# Patient Record
Sex: Male | Born: 1945 | Race: Black or African American | Hispanic: No | Marital: Married | Smoking: Former smoker
Health system: Southern US, Community
[De-identification: ages and names within clinical notes are randomized; demographics above are authoritative.]

## PROBLEM LIST (undated history)

## (undated) DIAGNOSIS — I10 Essential (primary) hypertension: Secondary | ICD-10-CM

## (undated) DIAGNOSIS — K219 Gastro-esophageal reflux disease without esophagitis: Secondary | ICD-10-CM

## (undated) DIAGNOSIS — E785 Hyperlipidemia, unspecified: Secondary | ICD-10-CM

## (undated) DIAGNOSIS — F4310 Post-traumatic stress disorder, unspecified: Secondary | ICD-10-CM

## (undated) DIAGNOSIS — I480 Paroxysmal atrial fibrillation: Secondary | ICD-10-CM

## (undated) DIAGNOSIS — M1990 Unspecified osteoarthritis, unspecified site: Secondary | ICD-10-CM

---

## 2018-10-04 ENCOUNTER — Other Ambulatory Visit: Payer: Self-pay

## 2018-10-04 ENCOUNTER — Emergency Department (HOSPITAL_COMMUNITY)
Admission: EM | Admit: 2018-10-04 | Discharge: 2018-10-04 | Disposition: A | Discharge status: Home / Self Care | Payer: PPO | Attending: Emergency Medicine | Admitting: Emergency Medicine

## 2018-10-04 ENCOUNTER — Encounter (HOSPITAL_COMMUNITY): Payer: Self-pay

## 2018-10-04 DIAGNOSIS — R319 Hematuria, unspecified: Secondary | ICD-10-CM | POA: Diagnosis not present

## 2018-10-04 HISTORY — DX: Gastro-esophageal reflux disease without esophagitis: K21.9

## 2018-10-04 HISTORY — DX: Unspecified osteoarthritis, unspecified site: M19.90

## 2018-10-04 HISTORY — DX: Post-traumatic stress disorder, unspecified: F43.10

## 2018-10-04 LAB — URINALYSIS, ROUTINE W REFLEX MICROSCOPIC
Bacteria, UA: NONE SEEN
Bilirubin Urine: NEGATIVE
Glucose, UA: NEGATIVE mg/dL
Ketones, ur: NEGATIVE mg/dL
Leukocytes, UA: NEGATIVE
Nitrite: NEGATIVE
Protein, ur: NEGATIVE mg/dL
Specific Gravity, Urine: 1.02 (ref 1.005–1.030)
pH: 6 (ref 5.0–8.0)

## 2018-10-04 NOTE — ED Triage Notes (Signed)
Pt reports bright red blood in his urine today. Denies pain or burning with urination . Denies abd pain.

## 2018-10-04 NOTE — ED Notes (Signed)
Called to confirm lab does not have a culture tube from patient.  Patient attempting to provide additional sample prior to d/c

## 2018-10-04 NOTE — ED Notes (Signed)
ED Provider at bedside. 

## 2018-10-04 NOTE — ED Provider Notes (Signed)
[facility] EMERGENCY DEPARTMENT Provider Note   CSN: [id number] Arrival date & time: 10/04/18  2027     History   Chief Complaint Chief Complaint  Patient presents with  . Hematuria    HPI Jerry Tran is a 72 y.o. male.  Patient presents to the emergency department with a chief complaint of hematuria.  He reports having had a couple of episodes of this today.  He has never had this before.  He denies any dysuria, penile discharge, flank pain, abdominal pain, or fever.  He denies any trauma.  Denies any back pain.  No treatments prior to arrival.  States that his urine has since cleared up some.  The history is provided by the patient. No language interpreter was used.    Past Medical History:  Diagnosis Date  . Arthritis   . GERD (gastroesophageal reflux disease)   . PTSD (post-traumatic stress disorder)     There are no active problems to display for this patient.   History reviewed. No pertinent surgical history.      Home Medications    Prior to Admission medications   Not on File    Family History No family history on file.  Social History Social History   Tobacco Use  . Smoking status: Never Smoker  Substance Use Topics  . Alcohol use: Never    Frequency: Never  . Drug use: Never     Allergies   Patient has no allergy information on record.   Review of Systems Review of Systems  All other systems reviewed and are negative.    Physical Exam Updated Vital Signs BP (!) 159/97 (BP Location: Right Arm)   Pulse 80   Temp 98.6 F (37 C) (Oral)   Resp 16   Ht 6\' 2"  (1.88 m)   Wt 110.2 kg   SpO2 100%   BMI 31.20 kg/m   Physical Exam  Constitutional: He is oriented to person, place, and time. He appears well-developed and well-nourished.  HENT:  Head: Normocephalic and atraumatic.  Eyes: Conjunctivae and EOM are normal.  Neck: Normal range of motion.  Cardiovascular: Normal rate.  Pulmonary/Chest: Effort normal.    Abdominal: He exhibits no distension.  No CVA tenderness, no abdominal tenderness  Genitourinary:  Genitourinary Comments: Uncircumcised male, no masses or lesions, no penile discharge, no testicular tenderness  Musculoskeletal: Normal range of motion.  Neurological: He is alert and oriented to person, place, and time.  Skin: Skin is dry.  Psychiatric: He has a normal mood and affect. His behavior is normal. Judgment and thought content normal.  Nursing note and vitals reviewed.    ED Treatments / Results  Labs (all labs ordered are listed, but only abnormal results are displayed) Labs Reviewed  URINALYSIS, ROUTINE W REFLEX MICROSCOPIC - Abnormal; Notable for the following components:      Result Value   Hgb urine dipstick MODERATE (*)    All other components within normal limits    EKG None  Radiology No results found.  Procedures Procedures (including critical care time)  Medications Ordered in ED Medications - No data to display   Initial Impression / Assessment and Plan / ED Course  I have reviewed the triage vital signs and the nursing notes.  Pertinent labs & imaging results that were available during my care of the patient were reviewed by me and considered in my medical decision making (see chart for details).     Patient with a couple of episodes  of painless hematuria today.  No evidence of kidney stone and history of physical exam, no evidence of UTI, only RBCs and hemoglobin on UA.  Will send for culture.  Discussed the importance of urology follow-up, as he will need to be evaluated for bladder cancer.  Patient will contact urology on Monday.  Return precautions given.  Final Clinical Impressions(s) / ED Diagnoses   Final diagnoses:  Hematuria, unspecified type    ED Discharge Orders    None       Montine Circle, PA-C 10/04/18 2233    Virgel Manifold, MD 10/05/18 1556

## 2018-10-06 LAB — URINE CULTURE

## 2021-03-30 DIAGNOSIS — C61 Malignant neoplasm of prostate: Secondary | ICD-10-CM | POA: Diagnosis present

## 2021-03-30 HISTORY — DX: Malignant neoplasm of prostate: C61

## 2021-09-11 ENCOUNTER — Emergency Department (HOSPITAL_COMMUNITY)
Admission: EM | Admit: 2021-09-11 | Discharge: 2021-09-11 | Disposition: A | Discharge status: Home / Self Care | Payer: No Typology Code available for payment source | Attending: Emergency Medicine | Admitting: Emergency Medicine

## 2021-09-11 ENCOUNTER — Encounter (HOSPITAL_COMMUNITY): Payer: Self-pay | Admitting: Emergency Medicine

## 2021-09-11 ENCOUNTER — Other Ambulatory Visit: Payer: Self-pay

## 2021-09-11 DIAGNOSIS — R42 Dizziness and giddiness: Secondary | ICD-10-CM | POA: Insufficient documentation

## 2021-09-11 LAB — CBC WITH DIFFERENTIAL/PLATELET
Abs Immature Granulocytes: 0.02 10*3/uL (ref 0.00–0.07)
Basophils Absolute: 0 10*3/uL (ref 0.0–0.1)
Basophils Relative: 1 %
Eosinophils Absolute: 0.1 10*3/uL (ref 0.0–0.5)
Eosinophils Relative: 1 %
HCT: 33.2 % — ABNORMAL LOW (ref 39.0–52.0)
Hemoglobin: 10.4 g/dL — ABNORMAL LOW (ref 13.0–17.0)
Immature Granulocytes: 0 %
Lymphocytes Relative: 37 %
Lymphs Abs: 1.9 10*3/uL (ref 0.7–4.0)
MCH: 25.9 pg — ABNORMAL LOW (ref 26.0–34.0)
MCHC: 31.3 g/dL (ref 30.0–36.0)
MCV: 82.6 fL (ref 80.0–100.0)
Monocytes Absolute: 0.4 10*3/uL (ref 0.1–1.0)
Monocytes Relative: 7 %
Neutro Abs: 2.6 10*3/uL (ref 1.7–7.7)
Neutrophils Relative %: 54 %
Platelets: 76 10*3/uL — ABNORMAL LOW (ref 150–400)
RBC: 4.02 MIL/uL — ABNORMAL LOW (ref 4.22–5.81)
RDW: 15 % (ref 11.5–15.5)
WBC: 5 10*3/uL (ref 4.0–10.5)
nRBC: 0 % (ref 0.0–0.2)

## 2021-09-11 LAB — COMPREHENSIVE METABOLIC PANEL
ALT: 18 U/L (ref 0–44)
AST: 29 U/L (ref 15–41)
Albumin: 3.4 g/dL — ABNORMAL LOW (ref 3.5–5.0)
Alkaline Phosphatase: 95 U/L (ref 38–126)
Anion gap: 8 (ref 5–15)
BUN: 37 mg/dL — ABNORMAL HIGH (ref 8–23)
CO2: 26 mmol/L (ref 22–32)
Calcium: 9.1 mg/dL (ref 8.9–10.3)
Chloride: 105 mmol/L (ref 98–111)
Creatinine, Ser: 1.85 mg/dL — ABNORMAL HIGH (ref 0.61–1.24)
GFR, Estimated: 38 mL/min — ABNORMAL LOW (ref >60)
Glucose, Bld: 114 mg/dL — ABNORMAL HIGH (ref 70–99)
Potassium: 3.7 mmol/L (ref 3.5–5.1)
Sodium: 139 mmol/L (ref 135–145)
Total Bilirubin: 0.7 mg/dL (ref 0.3–1.2)
Total Protein: 8.1 g/dL (ref 6.5–8.1)

## 2021-09-11 LAB — TROPONIN I (HIGH SENSITIVITY)
Troponin I (High Sensitivity): 12 ng/L (ref <18)
Troponin I (High Sensitivity): 13 ng/L (ref <18)

## 2021-09-11 LAB — CBG MONITORING, ED: Glucose-Capillary: 107 mg/dL — ABNORMAL HIGH (ref 70–99)

## 2021-09-11 MED ORDER — SODIUM CHLORIDE 0.9 % IV BOLUS
500.0000 mL | Freq: Once | INTRAVENOUS | Status: AC
  Administered 2021-09-11: 500 mL via INTRAVENOUS

## 2021-09-11 MED ORDER — SODIUM CHLORIDE 0.9 % IV BOLUS: 1000.0000 mL | Freq: Once | INTRAVENOUS | Status: DC

## 2021-09-11 NOTE — ED Triage Notes (Signed)
C/o dizziness since waking up at 8:30am this morning. BP at home 99/66.  Went to bed at 1am and felt fine.  No weakness/numbness.  States BP has been low since Wednesday and he had a syncopal episode on Wednesday and seen at [facility] in [place].  Denies SOB, pain, nausea, and vomiting.

## 2021-09-11 NOTE — Discharge Instructions (Addendum)
For now do not take your furosemide until further instructed by your primary care physician.  Call your primary doctor tomorrow morning to help arrange outpatient repeat lab work and follow-up for your medications.

## 2021-09-11 NOTE — ED Provider Notes (Signed)
[facility] EMERGENCY DEPARTMENT Provider Note   CSN: [id number] Arrival date & time: 09/11/21  1109     History Chief Complaint  Patient presents with   Dizziness   Low BP    Jerry Tran is a 75 y.o. male.  HPI 75 year old male presents with dizziness and low blood pressure.  At around 1 AM on 11/9 he got up to go get some water and while he was getting water he passed out.  He went to the [facility] emergency department and then he was released.  Family states they check some blood work and told him to restart his meds which she had recently stopped.  Since then he has been not feeling quite right and a little bit dizzy.  Today he was lightheaded again when he woke up and so his wife took his blood pressure and it was 99/60.  He has been taking his meds including metoprolol and Lasix.  He was recently diagnosed with congestive heart failure by report about a month or so ago.  However he has no chest pain, shortness of breath, leg swelling.  He denies headache, fever, vomiting, diarrhea, or any other acute symptoms.  Right now he is not feeling dizzy.  Past Medical History:  Diagnosis Date   Arthritis    GERD (gastroesophageal reflux disease)    PTSD (post-traumatic stress disorder)     There are no problems to display for this patient.   History reviewed. No pertinent surgical history.     No family history on file.  Social History   Tobacco Use   Smoking status: Never   Smokeless tobacco: Never  Substance Use Topics   Alcohol use: Never   Drug use: Never    Home Medications Prior to Admission medications   Medication Sig Start Date End Date Taking? Authorizing Provider  ammonium lactate (LAC-HYDRIN) 12 % lotion Apply 1 application topically 2 (two) times daily as needed for dry skin.   Yes [provider]  bisacodyl (DULCOLAX) 5 MG EC tablet Take 5 mg by mouth daily as needed for moderate constipation.   Yes [provider]   cholecalciferol (VITAMIN D3) 25 MCG (1000 UNIT) tablet Take 1,000 Units by mouth daily.   Yes [provider]  diclofenac Sodium (VOLTAREN) 1 % GEL Apply 2 g topically 4 (four) times daily as needed (pain).   Yes [provider]  empagliflozin (JARDIANCE) 25 MG TABS tablet Take 12.5 mg by mouth daily.   Yes [provider]  flecainide (TAMBOCOR) 100 MG tablet Take 100 mg by mouth every 12 (twelve) hours.   Yes [provider]  fluticasone (FLONASE) 50 MCG/ACT nasal spray Place 1 spray into both nostrils daily as needed for allergies or rhinitis.   Yes [provider]  furosemide (LASIX) 20 MG tablet Take 20 mg by mouth as directed. Take 1 tablet Daily & Take 1 tablet if weight gain>5 lbs   Yes [provider]  hydroxypropyl methylcellulose / hypromellose (ISOPTO TEARS / GONIOVISC) 2.5 % ophthalmic solution Place 2 drops into both eyes in the morning and at bedtime.   Yes [provider]  meloxicam (MOBIC) 15 MG tablet Take 15 mg by mouth daily.   Yes [provider]  methocarbamol (ROBAXIN) 500 MG tablet Take 500 mg by mouth every 8 (eight) hours as needed for muscle spasms.   Yes [provider]  metoprolol succinate (TOPROL-XL) 50 MG 24 hr tablet Take 25 mg by mouth  every evening. Take with or immediately following a meal.   Yes [provider]  omeprazole (PRILOSEC) 20 MG capsule Take 20 mg by mouth daily.   Yes [provider]  prazosin (MINIPRESS) 2 MG capsule Take 6 mg by mouth at bedtime.   Yes [provider]  terbinafine (LAMISIL) 1 % cream Apply 1 application topically 2 (two) times daily as needed (athlete's foot).   Yes [provider]  triamcinolone cream (KENALOG) 0.1 % Apply 1 application topically 2 (two) times daily as needed (rash).   Yes [provider]    Allergies    Patient has no known allergies.  Review of Systems   Review of Systems   Constitutional:  Negative for fever.  Respiratory:  Negative for cough and shortness of breath.   Cardiovascular:  Negative for chest pain.  Gastrointestinal:  Negative for abdominal pain, diarrhea and vomiting.  Genitourinary:  Negative for dysuria.  Neurological:  Positive for syncope and light-headedness. Negative for headaches.  All other systems reviewed and are negative.  Physical Exam Updated Vital Signs BP 136/81   Pulse 71   Temp 98.6 F (37 C) (Oral)   Resp 14   SpO2 (!) 86%   Physical Exam Vitals and nursing note reviewed.  Constitutional:      General: He is not in acute distress.    Appearance: He is well-developed. He is not ill-appearing or diaphoretic.  HENT:     Head: Normocephalic and atraumatic.     Right Ear: External ear normal.     Left Ear: External ear normal.     Nose: Nose normal.  Eyes:     General:        Right eye: No discharge.        Left eye: No discharge.     Extraocular Movements: Extraocular movements intact.     Pupils: Pupils are equal, round, and reactive to light.  Cardiovascular:     Rate and Rhythm: Normal rate and regular rhythm.     Heart sounds: Normal heart sounds.  Pulmonary:     Effort: Pulmonary effort is normal.     Breath sounds: Normal breath sounds.  Abdominal:     Palpations: Abdomen is soft.     Tenderness: There is no abdominal tenderness.  Musculoskeletal:     Cervical back: Neck supple.     Right lower leg: No edema.     Left lower leg: No edema.  Skin:    General: Skin is warm and dry.  Neurological:     Mental Status: He is alert.     Comments: CN 3-12 grossly intact. 5/5 strength in all 4 extremities. Grossly normal sensation. Normal finger to nose.   Psychiatric:        Mood and Affect: Mood is not anxious.    ED Results / Procedures / Treatments   Labs (all labs ordered are listed, but only abnormal results are displayed) Labs Reviewed  COMPREHENSIVE METABOLIC PANEL - Abnormal; Notable for the  following components:      Result Value   Glucose, Bld 114 (*)    BUN 37 (*)    Creatinine, Ser 1.85 (*)    Albumin 3.4 (*)    GFR, Estimated 38 (*)    All other components within normal limits  CBC WITH DIFFERENTIAL/PLATELET - Abnormal; Notable for the following components:   RBC 4.02 (*)    Hemoglobin 10.4 (*)    HCT 33.2 (*)    [facility]  25.9 (*)    Platelets 76 (*)    All other components within normal limits  CBG MONITORING, ED - Abnormal; Notable for the following components:   Glucose-Capillary 107 (*)    All other components within normal limits  TROPONIN I (HIGH SENSITIVITY)  TROPONIN I (HIGH SENSITIVITY)    EKG EKG Interpretation  Date/Time:  Sunday September 11 2021 11:55:03 EST Ventricular Rate:  67 PR Interval:  210 QRS Duration: 82 QT Interval:  372 QTC Calculation: 393 R Axis:   78 Text Interpretation: Sinus rhythm with sinus arrhythmia with 1st degree A-V block Possible Anterior infarct , age undetermined Abnormal ECG Confirmed by Kommor, Madison ([id number]) on 09/11/2021 2:22:58 PM  Radiology No results found.  Procedures Procedures   Medications Ordered in ED Medications  sodium chloride 0.9 % bolus 500 mL (500 mLs Intravenous New Bag/Given 09/11/21 1547)    ED Course  I have reviewed the triage vital signs and the nursing notes.  Pertinent labs & imaging results that were available during my care of the patient were reviewed by me and considered in my medical decision making (see chart for details).    MDM Rules/Calculators/A&P                           Patient has no hypotension currently.  He feels quite well.  He was given a small bolus of IV fluids as it is unclear based on lack of significant outside records what his EF is.  However he was able to stand up and while he transiently had a blood pressure of 99 he felt no symptoms and while standing it came back up.  I think it be best for him to hold his 20 mg Lasix for now and follow-up with his PCP.   We discussed that he has an abnormal creatinine today though is unclear what his baseline is.  We try to get some records but after an hour and a half, still have been unable to get these records from the [place].  Patient and family want to go home which I think is pretty reasonable as I doubt he is in acute renal failure.  However discussed need to follow-up closely with PCP and get labs rechecked.  No evidence of acute infection/sepsis.  No chest symptoms. Final Clinical Impression(s) / ED Diagnoses Final diagnoses:  Lightheadedness    Rx / DC Orders ED Discharge Orders     None        Sherwood Gambler, MD 09/11/21 1640

## 2021-09-11 NOTE — ED Provider Notes (Signed)
Emergency Medicine Provider Triage Evaluation Note  Jerry Tran , a 75 y.o. male  was evaluated in triage.  Pt complains of tension and dizziness.  He states that about 830 this morning he woke up feeling dizzy.  He states he took his blood pressure at home and it was 90s over 60s.  States that this has been an issue over the past week and a half.  He called primary care on Friday 09/02/21 for low blood pressure and dizziness.  They instructed him to stop a blood pressure medication which he is unsure which 1.  He states that on Wednesday he then had similar symptoms and low blood pressure and presented to the emergency department at the [facility].  He states that he then had an episode this morning that was the same endorses syncope on Wednesday.  Has not had syncope today.  He denies chest pain, palpitations, shortness of breath, nausea or vomiting, fevers, numbness or tingling to bilateral upper or lower extremities, slurred speech. Review of Systems  Positive: Dizziness, lightheadedness, syncope Negative: Chest pain, shortness of breath  Physical Exam  BP (!) 97/58   Pulse 67   Temp 98.6 F (37 C) (Oral)   Resp 18   SpO2 97%  Gen:   Awake, no distress   Resp:  Normal effort  MSK:   Moves extremities without difficulty  Other:  S1/S2 without murmurs.  Pulses 2+ bilaterally.  He is alert and oriented with no focal neurological deficits.  Medical Decision Making  Medically screening exam initiated at 12:06 PM.  Appropriate orders placed.  Kendel Bessey was informed that the remainder of the evaluation will be completed by another provider, this initial triage assessment does not replace that evaluation, and the importance of remaining in the ED until their evaluation is complete.     Mickie Hillier, PA-C 09/11/21 1209    Jeanell Sparrow, DO 09/11/21 1512

## 2021-12-08 ENCOUNTER — Encounter (HOSPITAL_COMMUNITY): Payer: Self-pay | Admitting: Emergency Medicine

## 2021-12-08 ENCOUNTER — Emergency Department (HOSPITAL_COMMUNITY): Payer: No Typology Code available for payment source

## 2021-12-08 ENCOUNTER — Inpatient Hospital Stay (HOSPITAL_COMMUNITY)
Admission: EM | Admit: 2021-12-08 | Discharge: 2021-12-11 | DRG: 840 | Disposition: A | Discharge status: Home / Self Care | Payer: No Typology Code available for payment source | Attending: Internal Medicine | Admitting: Internal Medicine

## 2021-12-08 ENCOUNTER — Other Ambulatory Visit: Payer: Self-pay

## 2021-12-08 DIAGNOSIS — Z7984 Long term (current) use of oral hypoglycemic drugs: Secondary | ICD-10-CM

## 2021-12-08 DIAGNOSIS — I480 Paroxysmal atrial fibrillation: Secondary | ICD-10-CM | POA: Diagnosis present

## 2021-12-08 DIAGNOSIS — I272 Pulmonary hypertension, unspecified: Secondary | ICD-10-CM | POA: Diagnosis present

## 2021-12-08 DIAGNOSIS — R591 Generalized enlarged lymph nodes: Secondary | ICD-10-CM

## 2021-12-08 DIAGNOSIS — I509 Heart failure, unspecified: Secondary | ICD-10-CM

## 2021-12-08 DIAGNOSIS — A048 Other specified bacterial intestinal infections: Secondary | ICD-10-CM | POA: Diagnosis present

## 2021-12-08 DIAGNOSIS — N1832 Chronic kidney disease, stage 3b: Secondary | ICD-10-CM | POA: Diagnosis present

## 2021-12-08 DIAGNOSIS — Z87891 Personal history of nicotine dependence: Secondary | ICD-10-CM

## 2021-12-08 DIAGNOSIS — D696 Thrombocytopenia, unspecified: Secondary | ICD-10-CM

## 2021-12-08 DIAGNOSIS — Z923 Personal history of irradiation: Secondary | ICD-10-CM

## 2021-12-08 DIAGNOSIS — Z79899 Other long term (current) drug therapy: Secondary | ICD-10-CM

## 2021-12-08 DIAGNOSIS — K219 Gastro-esophageal reflux disease without esophagitis: Secondary | ICD-10-CM | POA: Diagnosis present

## 2021-12-08 DIAGNOSIS — C8311 Mantle cell lymphoma, lymph nodes of head, face, and neck: Principal | ICD-10-CM | POA: Diagnosis present

## 2021-12-08 DIAGNOSIS — Z91120 Patient's intentional underdosing of medication regimen due to financial hardship: Secondary | ICD-10-CM

## 2021-12-08 DIAGNOSIS — D61818 Other pancytopenia: Secondary | ICD-10-CM | POA: Diagnosis present

## 2021-12-08 DIAGNOSIS — E785 Hyperlipidemia, unspecified: Secondary | ICD-10-CM | POA: Diagnosis present

## 2021-12-08 DIAGNOSIS — I130 Hypertensive heart and chronic kidney disease with heart failure and stage 1 through stage 4 chronic kidney disease, or unspecified chronic kidney disease: Secondary | ICD-10-CM | POA: Diagnosis present

## 2021-12-08 DIAGNOSIS — R778 Other specified abnormalities of plasma proteins: Secondary | ICD-10-CM

## 2021-12-08 DIAGNOSIS — Z8546 Personal history of malignant neoplasm of prostate: Secondary | ICD-10-CM

## 2021-12-08 DIAGNOSIS — C61 Malignant neoplasm of prostate: Secondary | ICD-10-CM | POA: Diagnosis present

## 2021-12-08 DIAGNOSIS — Z791 Long term (current) use of non-steroidal anti-inflammatories (NSAID): Secondary | ICD-10-CM

## 2021-12-08 DIAGNOSIS — Z888 Allergy status to other drugs, medicaments and biological substances status: Secondary | ICD-10-CM

## 2021-12-08 DIAGNOSIS — R319 Hematuria, unspecified: Secondary | ICD-10-CM | POA: Diagnosis present

## 2021-12-08 DIAGNOSIS — I5033 Acute on chronic diastolic (congestive) heart failure: Secondary | ICD-10-CM | POA: Diagnosis present

## 2021-12-08 DIAGNOSIS — F4310 Post-traumatic stress disorder, unspecified: Secondary | ICD-10-CM | POA: Diagnosis present

## 2021-12-08 DIAGNOSIS — I10 Essential (primary) hypertension: Secondary | ICD-10-CM | POA: Diagnosis present

## 2021-12-08 DIAGNOSIS — Z20822 Contact with and (suspected) exposure to covid-19: Secondary | ICD-10-CM | POA: Diagnosis present

## 2021-12-08 DIAGNOSIS — B9681 Helicobacter pylori [H. pylori] as the cause of diseases classified elsewhere: Secondary | ICD-10-CM | POA: Diagnosis present

## 2021-12-08 HISTORY — DX: Hyperlipidemia, unspecified: E78.5

## 2021-12-08 HISTORY — DX: Paroxysmal atrial fibrillation: I48.0

## 2021-12-08 HISTORY — DX: Essential (primary) hypertension: I10

## 2021-12-08 LAB — COMPREHENSIVE METABOLIC PANEL
ALT: 15 U/L (ref 0–44)
AST: 51 U/L — ABNORMAL HIGH (ref 15–41)
Albumin: 2.9 g/dL — ABNORMAL LOW (ref 3.5–5.0)
Alkaline Phosphatase: 91 U/L (ref 38–126)
Anion gap: 7 (ref 5–15)
BUN: 19 mg/dL (ref 8–23)
CO2: 21 mmol/L — ABNORMAL LOW (ref 22–32)
Calcium: 8.5 mg/dL — ABNORMAL LOW (ref 8.9–10.3)
Chloride: 105 mmol/L (ref 98–111)
Creatinine, Ser: 1.72 mg/dL — ABNORMAL HIGH (ref 0.61–1.24)
GFR, Estimated: 41 mL/min — ABNORMAL LOW (ref >60)
Glucose, Bld: 94 mg/dL (ref 70–99)
Potassium: 3.7 mmol/L (ref 3.5–5.1)
Sodium: 133 mmol/L — ABNORMAL LOW (ref 135–145)
Total Bilirubin: 1.1 mg/dL (ref 0.3–1.2)
Total Protein: 7.4 g/dL (ref 6.5–8.1)

## 2021-12-08 LAB — CBC WITH DIFFERENTIAL/PLATELET
Abs Immature Granulocytes: 0 10*3/uL (ref 0.00–0.07)
Basophils Absolute: 0 10*3/uL (ref 0.0–0.1)
Basophils Relative: 0 %
Eosinophils Absolute: 0 10*3/uL (ref 0.0–0.5)
Eosinophils Relative: 0 %
HCT: 28.5 % — ABNORMAL LOW (ref 39.0–52.0)
Hemoglobin: 9 g/dL — ABNORMAL LOW (ref 13.0–17.0)
Lymphocytes Relative: 32 %
Lymphs Abs: 1.4 10*3/uL (ref 0.7–4.0)
MCH: 29.6 pg (ref 26.0–34.0)
MCHC: 31.6 g/dL (ref 30.0–36.0)
MCV: 93.8 fL (ref 80.0–100.0)
Monocytes Absolute: 0.1 10*3/uL (ref 0.1–1.0)
Monocytes Relative: 2 %
Neutro Abs: 2.8 10*3/uL (ref 1.7–7.7)
Neutrophils Relative %: 66 %
Platelets: 36 10*3/uL — ABNORMAL LOW (ref 150–400)
RBC: 3.04 MIL/uL — ABNORMAL LOW (ref 4.22–5.81)
RDW: 19.2 % — ABNORMAL HIGH (ref 11.5–15.5)
Smear Review: DECREASED
WBC: 4.3 10*3/uL (ref 4.0–10.5)
nRBC: 1.4 % — ABNORMAL HIGH (ref 0.0–0.2)
nRBC: 2 /100 WBC — ABNORMAL HIGH

## 2021-12-08 LAB — URINALYSIS, ROUTINE W REFLEX MICROSCOPIC
Bacteria, UA: NONE SEEN
Bilirubin Urine: NEGATIVE
Glucose, UA: NEGATIVE mg/dL
Ketones, ur: NEGATIVE mg/dL
Nitrite: NEGATIVE
Protein, ur: 100 mg/dL — AB
Specific Gravity, Urine: 1.019 (ref 1.005–1.030)
pH: 5 (ref 5.0–8.0)

## 2021-12-08 NOTE — ED Provider Triage Note (Signed)
Emergency Medicine Provider Triage Evaluation Note  Maxim Bedel , a 76 y.o. male  was evaluated in triage.  Pt complains of hematuria.  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  Over the past few weeks he notes increasing shortness of breath.  Worse with exertion and improves with rest.  No chest pain or abdominal pain.  States that recently he has had GI symptoms as well and notes frequent diarrhea.  No hematochezia, nausea, or vomiting.  Lastly, patient noted intermittent hematuria in the past but that began today and he had a single episode of bright red blood in his urine.  Denies any recurrent hematuria today.  Physical Exam  There were no vitals taken for this visit. Gen:   Awake, no distress   Resp:  Normal effort  MSK:   Moves extremities without difficulty  Other:    Medical Decision Making  Medically screening exam initiated at 10:14 PM.  Appropriate orders placed.  Viraaj Vorndran was informed that the remainder of the evaluation will be completed by another provider, this initial triage assessment does not replace that evaluation, and the importance of remaining in the ED until their evaluation is complete.   Rayna Sexton, PA-C 12/08/21 2215

## 2021-12-08 NOTE — ED Triage Notes (Signed)
Pt reported to ED with c/o hematuria, reports having it  "a couple of times" but is unclear about when it has happened. States he had an episode tonight and wants futher evaluation. Also reports starting "medicine to help him pee", but does not know the name of medication. Pt denies any pain at this time, denies any difficulty with urine or starting stream.

## 2021-12-09 ENCOUNTER — Encounter (HOSPITAL_COMMUNITY)
Admission: EM | Disposition: A | Discharge status: Home / Self Care | Payer: Self-pay | Source: Home / Self Care | Attending: Internal Medicine

## 2021-12-09 ENCOUNTER — Emergency Department (HOSPITAL_COMMUNITY): Payer: No Typology Code available for payment source

## 2021-12-09 ENCOUNTER — Inpatient Hospital Stay (HOSPITAL_COMMUNITY): Payer: No Typology Code available for payment source | Admitting: Anesthesiology

## 2021-12-09 ENCOUNTER — Encounter (HOSPITAL_COMMUNITY): Payer: Self-pay | Admitting: Internal Medicine

## 2021-12-09 DIAGNOSIS — I10 Essential (primary) hypertension: Secondary | ICD-10-CM

## 2021-12-09 DIAGNOSIS — R591 Generalized enlarged lymph nodes: Secondary | ICD-10-CM | POA: Diagnosis present

## 2021-12-09 DIAGNOSIS — I5033 Acute on chronic diastolic (congestive) heart failure: Secondary | ICD-10-CM | POA: Diagnosis present

## 2021-12-09 DIAGNOSIS — Z79899 Other long term (current) drug therapy: Secondary | ICD-10-CM | POA: Diagnosis not present

## 2021-12-09 DIAGNOSIS — Z87891 Personal history of nicotine dependence: Secondary | ICD-10-CM | POA: Diagnosis not present

## 2021-12-09 DIAGNOSIS — F4310 Post-traumatic stress disorder, unspecified: Secondary | ICD-10-CM | POA: Diagnosis present

## 2021-12-09 DIAGNOSIS — E785 Hyperlipidemia, unspecified: Secondary | ICD-10-CM | POA: Diagnosis present

## 2021-12-09 DIAGNOSIS — K219 Gastro-esophageal reflux disease without esophagitis: Secondary | ICD-10-CM

## 2021-12-09 DIAGNOSIS — A048 Other specified bacterial intestinal infections: Secondary | ICD-10-CM | POA: Diagnosis present

## 2021-12-09 DIAGNOSIS — N1832 Chronic kidney disease, stage 3b: Secondary | ICD-10-CM

## 2021-12-09 DIAGNOSIS — Z923 Personal history of irradiation: Secondary | ICD-10-CM | POA: Diagnosis not present

## 2021-12-09 DIAGNOSIS — B9681 Helicobacter pylori [H. pylori] as the cause of diseases classified elsewhere: Secondary | ICD-10-CM | POA: Diagnosis present

## 2021-12-09 DIAGNOSIS — F419 Anxiety disorder, unspecified: Secondary | ICD-10-CM

## 2021-12-09 DIAGNOSIS — Z8546 Personal history of malignant neoplasm of prostate: Secondary | ICD-10-CM | POA: Diagnosis not present

## 2021-12-09 DIAGNOSIS — D61818 Other pancytopenia: Secondary | ICD-10-CM | POA: Diagnosis present

## 2021-12-09 DIAGNOSIS — Z7984 Long term (current) use of oral hypoglycemic drugs: Secondary | ICD-10-CM | POA: Diagnosis not present

## 2021-12-09 DIAGNOSIS — Z791 Long term (current) use of non-steroidal anti-inflammatories (NSAID): Secondary | ICD-10-CM | POA: Diagnosis not present

## 2021-12-09 DIAGNOSIS — Z20822 Contact with and (suspected) exposure to covid-19: Secondary | ICD-10-CM | POA: Diagnosis present

## 2021-12-09 DIAGNOSIS — I272 Pulmonary hypertension, unspecified: Secondary | ICD-10-CM | POA: Diagnosis present

## 2021-12-09 DIAGNOSIS — Z91120 Patient's intentional underdosing of medication regimen due to financial hardship: Secondary | ICD-10-CM | POA: Diagnosis not present

## 2021-12-09 DIAGNOSIS — C8311 Mantle cell lymphoma, lymph nodes of head, face, and neck: Secondary | ICD-10-CM | POA: Diagnosis present

## 2021-12-09 DIAGNOSIS — I480 Paroxysmal atrial fibrillation: Secondary | ICD-10-CM | POA: Diagnosis present

## 2021-12-09 DIAGNOSIS — I130 Hypertensive heart and chronic kidney disease with heart failure and stage 1 through stage 4 chronic kidney disease, or unspecified chronic kidney disease: Secondary | ICD-10-CM | POA: Diagnosis present

## 2021-12-09 DIAGNOSIS — C61 Malignant neoplasm of prostate: Secondary | ICD-10-CM | POA: Diagnosis present

## 2021-12-09 DIAGNOSIS — R319 Hematuria, unspecified: Secondary | ICD-10-CM | POA: Diagnosis present

## 2021-12-09 DIAGNOSIS — R0602 Shortness of breath: Secondary | ICD-10-CM | POA: Diagnosis not present

## 2021-12-09 DIAGNOSIS — Z888 Allergy status to other drugs, medicaments and biological substances status: Secondary | ICD-10-CM | POA: Diagnosis not present

## 2021-12-09 HISTORY — DX: Chronic kidney disease, stage 3b: N18.32

## 2021-12-09 HISTORY — PX: LIPOMA EXCISION: SHX5283

## 2021-12-09 LAB — BRAIN NATRIURETIC PEPTIDE: B Natriuretic Peptide: 703.5 pg/mL — ABNORMAL HIGH (ref 0.0–100.0)

## 2021-12-09 LAB — CBC WITH DIFFERENTIAL/PLATELET
Abs Immature Granulocytes: 0.06 10*3/uL (ref 0.00–0.07)
Basophils Absolute: 0 10*3/uL (ref 0.0–0.1)
Basophils Relative: 1 %
Eosinophils Absolute: 0 10*3/uL (ref 0.0–0.5)
Eosinophils Relative: 0 %
HCT: 28.9 % — ABNORMAL LOW (ref 39.0–52.0)
Hemoglobin: 8.8 g/dL — ABNORMAL LOW (ref 13.0–17.0)
Immature Granulocytes: 1 %
Lymphocytes Relative: 35 %
Lymphs Abs: 1.6 10*3/uL (ref 0.7–4.0)
MCH: 29.1 pg (ref 26.0–34.0)
MCHC: 30.4 g/dL (ref 30.0–36.0)
MCV: 95.7 fL (ref 80.0–100.0)
Monocytes Absolute: 0.3 10*3/uL (ref 0.1–1.0)
Monocytes Relative: 6 %
Neutro Abs: 2.7 10*3/uL (ref 1.7–7.7)
Neutrophils Relative %: 57 %
Platelets: 44 10*3/uL — ABNORMAL LOW (ref 150–400)
RBC: 3.02 MIL/uL — ABNORMAL LOW (ref 4.22–5.81)
RDW: 19.8 % — ABNORMAL HIGH (ref 11.5–15.5)
WBC: 4.6 10*3/uL (ref 4.0–10.5)
nRBC: 0.6 % — ABNORMAL HIGH (ref 0.0–0.2)

## 2021-12-09 LAB — RESP PANEL BY RT-PCR (FLU A&B, COVID) ARPGX2
Influenza A by PCR: NEGATIVE
Influenza B by PCR: NEGATIVE
SARS Coronavirus 2 by RT PCR: NEGATIVE

## 2021-12-09 LAB — TROPONIN I (HIGH SENSITIVITY): Troponin I (High Sensitivity): 32 ng/L — ABNORMAL HIGH (ref <18)

## 2021-12-09 SURGERY — EXCISION LIPOMA
Anesthesia: General | Laterality: Right

## 2021-12-09 MED ORDER — ONDANSETRON HCL 4 MG/2ML IJ SOLN: 4.0000 mg | Freq: Four times a day (QID) | INTRAMUSCULAR | Status: DC | PRN

## 2021-12-09 MED ORDER — ONDANSETRON HCL 4 MG PO TABS: 4.0000 mg | ORAL_TABLET | Freq: Four times a day (QID) | ORAL | Status: DC | PRN

## 2021-12-09 MED ORDER — LACTATED RINGERS IV SOLN: INTRAVENOUS | Status: DC | PRN

## 2021-12-09 MED ORDER — METOPROLOL SUCCINATE ER 25 MG PO TB24: 25.0000 mg | ORAL_TABLET | Freq: Every evening | ORAL | Status: DC

## 2021-12-09 MED ORDER — DEXAMETHASONE SODIUM PHOSPHATE 10 MG/ML IJ SOLN
INTRAMUSCULAR | Status: DC | PRN
  Administered 2021-12-09: 10 mg via INTRAVENOUS

## 2021-12-09 MED ORDER — PHENYLEPHRINE 40 MCG/ML (10ML) SYRINGE FOR IV PUSH (FOR BLOOD PRESSURE SUPPORT)
PREFILLED_SYRINGE | INTRAVENOUS | Status: DC | PRN
  Administered 2021-12-09: 80 ug via INTRAVENOUS
  Administered 2021-12-09: 120 ug via INTRAVENOUS

## 2021-12-09 MED ORDER — EPHEDRINE SULFATE-NACL 50-0.9 MG/10ML-% IV SOSY
PREFILLED_SYRINGE | INTRAVENOUS | Status: DC | PRN
  Administered 2021-12-09 (×3): 5 mg via INTRAVENOUS
  Administered 2021-12-09: 10 mg via INTRAVENOUS

## 2021-12-09 MED ORDER — BUPIVACAINE-EPINEPHRINE 0.5% -1:200000 IJ SOLN
INTRAMUSCULAR | Status: AC
  Filled 2021-12-09: qty 1

## 2021-12-09 MED ORDER — CEFAZOLIN SODIUM-DEXTROSE 2-4 GM/100ML-% IV SOLN: 2.0000 g | Freq: Three times a day (TID) | INTRAVENOUS | Status: DC

## 2021-12-09 MED ORDER — PHENYLEPHRINE HCL-NACL 20-0.9 MG/250ML-% IV SOLN
INTRAVENOUS | Status: DC | PRN
Start: 2021-12-09 — End: 2021-12-09
  Administered 2021-12-09: 30 ug/min via INTRAVENOUS

## 2021-12-09 MED ORDER — LIDOCAINE HCL 1 % IJ SOLN
INTRAMUSCULAR | Status: AC
  Filled 2021-12-09: qty 20

## 2021-12-09 MED ORDER — LIDOCAINE 2% (20 MG/ML) 5 ML SYRINGE
INTRAMUSCULAR | Status: DC | PRN
  Administered 2021-12-09: 100 mg via INTRAVENOUS

## 2021-12-09 MED ORDER — PROMETHAZINE HCL 25 MG/ML IJ SOLN: 6.2500 mg | INTRAMUSCULAR | Status: DC | PRN

## 2021-12-09 MED ORDER — POLYETHYLENE GLYCOL 3350 17 G PO PACK: 17.0000 g | PACK | Freq: Every day | ORAL | Status: DC | PRN

## 2021-12-09 MED ORDER — PROPOFOL 10 MG/ML IV BOLUS
INTRAVENOUS | Status: DC | PRN
  Administered 2021-12-09: 20 mg via INTRAVENOUS
  Administered 2021-12-09: 150 mg via INTRAVENOUS

## 2021-12-09 MED ORDER — 0.9 % SODIUM CHLORIDE (POUR BTL) OPTIME
TOPICAL | Status: DC | PRN
  Administered 2021-12-09: 1000 mL

## 2021-12-09 MED ORDER — OXYCODONE HCL 5 MG PO TABS: 5.0000 mg | ORAL_TABLET | ORAL | Status: DC | PRN

## 2021-12-09 MED ORDER — ONDANSETRON HCL 4 MG/2ML IJ SOLN
INTRAMUSCULAR | Status: AC
  Filled 2021-12-09: qty 2

## 2021-12-09 MED ORDER — ENSURE ENLIVE PO LIQD
237.0000 mL | Freq: Two times a day (BID) | ORAL | Status: DC
  Administered 2021-12-10 (×2): 237 mL via ORAL

## 2021-12-09 MED ORDER — CHLORHEXIDINE GLUCONATE 0.12 % MT SOLN
OROMUCOSAL | Status: AC
  Administered 2021-12-09: 15 mL
  Filled 2021-12-09: qty 15

## 2021-12-09 MED ORDER — PRAZOSIN HCL 1 MG PO CAPS
6.0000 mg | ORAL_CAPSULE | Freq: Every day | ORAL | Status: DC
  Administered 2021-12-09 – 2021-12-10 (×2): 6 mg via ORAL
  Filled 2021-12-09 (×3): qty 6
  Filled 2021-12-09: qty 3

## 2021-12-09 MED ORDER — DEXAMETHASONE SODIUM PHOSPHATE 10 MG/ML IJ SOLN
INTRAMUSCULAR | Status: AC
  Filled 2021-12-09: qty 1

## 2021-12-09 MED ORDER — BISACODYL 5 MG PO TBEC: 5.0000 mg | DELAYED_RELEASE_TABLET | Freq: Every day | ORAL | Status: DC | PRN

## 2021-12-09 MED ORDER — FENTANYL CITRATE (PF) 250 MCG/5ML IJ SOLN
INTRAMUSCULAR | Status: DC | PRN
Start: 2021-12-09 — End: 2021-12-09
  Administered 2021-12-09 (×3): 25 ug via INTRAVENOUS
  Administered 2021-12-09: 50 ug via INTRAVENOUS
  Administered 2021-12-09 (×2): 25 ug via INTRAVENOUS

## 2021-12-09 MED ORDER — HYDRALAZINE HCL 20 MG/ML IJ SOLN: 5.0000 mg | INTRAMUSCULAR | Status: DC | PRN

## 2021-12-09 MED ORDER — FENTANYL CITRATE (PF) 250 MCG/5ML IJ SOLN
INTRAMUSCULAR | Status: AC
  Filled 2021-12-09: qty 5

## 2021-12-09 MED ORDER — IOHEXOL 350 MG/ML SOLN
75.0000 mL | Freq: Once | INTRAVENOUS | Status: AC | PRN
  Administered 2021-12-09: 75 mL via INTRAVENOUS

## 2021-12-09 MED ORDER — ACETAMINOPHEN 650 MG RE SUPP: 650.0000 mg | Freq: Four times a day (QID) | RECTAL | Status: DC | PRN

## 2021-12-09 MED ORDER — ACETAMINOPHEN 325 MG PO TABS: 650.0000 mg | ORAL_TABLET | Freq: Four times a day (QID) | ORAL | Status: DC | PRN

## 2021-12-09 MED ORDER — FENTANYL CITRATE (PF) 100 MCG/2ML IJ SOLN: 25.0000 ug | INTRAMUSCULAR | Status: DC | PRN

## 2021-12-09 MED ORDER — ATORVASTATIN CALCIUM 10 MG PO TABS
10.0000 mg | ORAL_TABLET | Freq: Every day | ORAL | Status: DC
  Administered 2021-12-09 – 2021-12-10 (×2): 10 mg via ORAL
  Filled 2021-12-09 (×2): qty 1

## 2021-12-09 MED ORDER — CEFAZOLIN SODIUM-DEXTROSE 2-4 GM/100ML-% IV SOLN
2.0000 g | INTRAVENOUS | Status: AC
Start: 2021-12-09 — End: 2021-12-09
  Administered 2021-12-09: 2 g via INTRAVENOUS
  Filled 2021-12-09: qty 100

## 2021-12-09 MED ORDER — DOCUSATE SODIUM 100 MG PO CAPS
100.0000 mg | ORAL_CAPSULE | Freq: Two times a day (BID) | ORAL | Status: DC
  Administered 2021-12-09 – 2021-12-11 (×3): 100 mg via ORAL
  Filled 2021-12-09 (×4): qty 1

## 2021-12-09 MED ORDER — FUROSEMIDE 10 MG/ML IJ SOLN
80.0000 mg | Freq: Once | INTRAMUSCULAR | Status: AC
  Administered 2021-12-09: 80 mg via INTRAVENOUS
  Filled 2021-12-09: qty 8

## 2021-12-09 MED ORDER — MORPHINE SULFATE (PF) 2 MG/ML IV SOLN: 2.0000 mg | INTRAVENOUS | Status: DC | PRN

## 2021-12-09 MED ORDER — ONDANSETRON HCL 4 MG/2ML IJ SOLN
INTRAMUSCULAR | Status: DC | PRN
Start: 2021-12-09 — End: 2021-12-09
  Administered 2021-12-09: 4 mg via INTRAVENOUS

## 2021-12-09 MED ORDER — AMISULPRIDE (ANTIEMETIC) 5 MG/2ML IV SOLN: 10.0000 mg | Freq: Once | INTRAVENOUS | Status: DC | PRN

## 2021-12-09 MED ORDER — PANTOPRAZOLE SODIUM 40 MG PO TBEC
40.0000 mg | DELAYED_RELEASE_TABLET | Freq: Every day | ORAL | Status: DC
  Administered 2021-12-09 – 2021-12-11 (×3): 40 mg via ORAL
  Filled 2021-12-09 (×3): qty 1

## 2021-12-09 MED ORDER — FLECAINIDE ACETATE 100 MG PO TABS
100.0000 mg | ORAL_TABLET | Freq: Two times a day (BID) | ORAL | Status: DC
  Administered 2021-12-09 – 2021-12-11 (×5): 100 mg via ORAL
  Filled 2021-12-09 (×7): qty 1

## 2021-12-09 MED ORDER — METOPROLOL TARTRATE 25 MG PO TABS
12.5000 mg | ORAL_TABLET | Freq: Two times a day (BID) | ORAL | Status: DC
  Administered 2021-12-09 – 2021-12-11 (×5): 12.5 mg via ORAL
  Filled 2021-12-09 (×5): qty 1

## 2021-12-09 SURGICAL SUPPLY — 37 items
ADH SKN CLS APL DERMABOND .7 (GAUZE/BANDAGES/DRESSINGS) ×1
BAG COUNTER SPONGE SURGICOUNT (BAG) ×2 IMPLANT
BAG SPNG CNTER NS LX DISP (BAG) ×1
CANISTER SUCT 3000ML PPV (MISCELLANEOUS) IMPLANT
COVER SURGICAL LIGHT HANDLE (MISCELLANEOUS) ×2 IMPLANT
DECANTER SPIKE VIAL GLASS SM (MISCELLANEOUS) IMPLANT
DERMABOND ADVANCED (GAUZE/BANDAGES/DRESSINGS) ×1
DERMABOND ADVANCED .7 DNX12 (GAUZE/BANDAGES/DRESSINGS) ×1 IMPLANT
DRAPE LAPAROTOMY 100X72 PEDS (DRAPES) ×2 IMPLANT
ELECT REM PT RETURN 9FT ADLT (ELECTROSURGICAL) ×2
ELECTRODE REM PT RTRN 9FT ADLT (ELECTROSURGICAL) ×1 IMPLANT
GLOVE SURG ENC MOIS LTX SZ7.5 (GLOVE) ×2 IMPLANT
GLOVE SURG UNDER LTX SZ8 (GLOVE) ×2 IMPLANT
GOWN STRL REUS W/ TWL LRG LVL3 (GOWN DISPOSABLE) ×1 IMPLANT
GOWN STRL REUS W/ TWL XL LVL3 (GOWN DISPOSABLE) ×1 IMPLANT
GOWN STRL REUS W/TWL LRG LVL3 (GOWN DISPOSABLE) ×2
GOWN STRL REUS W/TWL XL LVL3 (GOWN DISPOSABLE) ×2
HEMOSTAT ARISTA ABSORB 3G PWDR (HEMOSTASIS) ×1 IMPLANT
KIT BASIN OR (CUSTOM PROCEDURE TRAY) ×2 IMPLANT
KIT TURNOVER KIT B (KITS) ×2 IMPLANT
NDL HYPO 25GX1X1/2 BEV (NEEDLE) ×1 IMPLANT
NEEDLE HYPO 25GX1X1/2 BEV (NEEDLE) ×2 IMPLANT
NS IRRIG 1000ML POUR BTL (IV SOLUTION) ×2 IMPLANT
PACK GENERAL/GYN (CUSTOM PROCEDURE TRAY) ×2 IMPLANT
PAD ARMBOARD 7.5X6 YLW CONV (MISCELLANEOUS) ×2 IMPLANT
PENCIL SMOKE EVACUATOR (MISCELLANEOUS) ×2 IMPLANT
SHEARS HARMONIC 9CM CVD (BLADE) ×1 IMPLANT
SPECIMEN JAR MEDIUM (MISCELLANEOUS) ×2 IMPLANT
SUCTION FRAZIER TIP 8 FR DISP (SUCTIONS) ×2
SUCTION TUBE FRAZIER 8FR DISP (SUCTIONS) IMPLANT
SUT MNCRL AB 4-0 PS2 18 (SUTURE) ×2 IMPLANT
SUT SILK 2 0 SH CR/8 (SUTURE) ×1 IMPLANT
SUT VIC AB 3-0 SH 27 (SUTURE) ×2
SUT VIC AB 3-0 SH 27XBRD (SUTURE) ×1 IMPLANT
SYR CONTROL 10ML LL (SYRINGE) ×2 IMPLANT
TOWEL GREEN STERILE (TOWEL DISPOSABLE) ×2 IMPLANT
TOWEL GREEN STERILE FF (TOWEL DISPOSABLE) ×1 IMPLANT

## 2021-12-09 NOTE — ED Provider Notes (Signed)
[facility] EMERGENCY DEPARTMENT Provider Note   CSN: [id number] Arrival date & time: 12/08/21  2007     History  Chief Complaint  Patient presents with   Hematuria    Fischer Halley is a 76 y.o. male.  76 year old male who gets most of his care at the [place] the presents emerged from today for hematuria.  Patient also states her last few weeks has had progressively worsening weakness, decreased appetite, weight loss and generally not feeling well.  Patient states he is scheduled to get a lymph node biopsy in a week but the hematuria worried his wife along the shortness of breath so brought her here for further evaluation.  He has left greater than right lower extremity swelling which is new in the last day but lower extremity swelling is not new for him is on torsemide at home.  Still makes urine but not as much as he should.  Patient denies any chest pain or new back pain.   Hematuria      Home Medications Prior to Admission medications   Medication Sig Start Date End Date Taking? Authorizing Provider  ammonium lactate (LAC-HYDRIN) 12 % lotion Apply 1 application topically 2 (two) times daily as needed for dry skin.    [provider]  bisacodyl (DULCOLAX) 5 MG EC tablet Take 5 mg by mouth daily as needed for moderate constipation.    [provider]  cholecalciferol (VITAMIN D3) 25 MCG (1000 UNIT) tablet Take 1,000 Units by mouth daily.    [provider]  diclofenac Sodium (VOLTAREN) 1 % GEL Apply 2 g topically 4 (four) times daily as needed (pain).    [provider]  empagliflozin (JARDIANCE) 25 MG TABS tablet Take 12.5 mg by mouth daily.    [provider]  flecainide (TAMBOCOR) 100 MG tablet Take 100 mg by mouth every 12 (twelve) hours.    [provider]  fluticasone (FLONASE) 50 MCG/ACT nasal spray Place 1 spray into both nostrils daily as needed for allergies or rhinitis.    [provider]   furosemide (LASIX) 20 MG tablet Take 20 mg by mouth as directed. Take 1 tablet Daily & Take 1 tablet if weight gain>5 lbs    [provider]  hydroxypropyl methylcellulose / hypromellose (ISOPTO TEARS / GONIOVISC) 2.5 % ophthalmic solution Place 2 drops into both eyes in the morning and at bedtime.    [provider]  meloxicam (MOBIC) 15 MG tablet Take 15 mg by mouth daily.    [provider]  methocarbamol (ROBAXIN) 500 MG tablet Take 500 mg by mouth every 8 (eight) hours as needed for muscle spasms.    [provider]  metoprolol succinate (TOPROL-XL) 50 MG 24 hr tablet Take 25 mg by mouth every evening. Take with or immediately following a meal.    [provider]  omeprazole (PRILOSEC) 20 MG capsule Take 20 mg by mouth daily.    [provider]  prazosin (MINIPRESS) 2 MG capsule Take 6 mg by mouth at bedtime.    [provider]  terbinafine (LAMISIL) 1 % cream Apply 1 application topically 2 (two) times daily as needed (athlete's foot).    [provider]  triamcinolone cream (KENALOG) 0.1 % Apply 1 application topically 2 (two) times daily as needed (rash).    [provider]      Allergies    Patient has no known allergies.    Review of Systems   Review of  Systems  Genitourinary:  Positive for hematuria.   Physical Exam Updated Vital Signs BP (!) 155/102    Pulse 95    Temp 98.6 F (37 C) (Oral)    Resp 16    SpO2 98%  Physical Exam Vitals and nursing note reviewed.  Constitutional:      Appearance: He is well-developed.  HENT:     Head: Normocephalic and atraumatic.     Nose: Nose normal. No congestion or rhinorrhea.     Mouth/Throat:     Mouth: Mucous membranes are moist.     Pharynx: Oropharynx is clear.  Eyes:     Pupils: Pupils are equal, round, and reactive to light.  Cardiovascular:     Rate and Rhythm: Normal rate.  Pulmonary:     Effort: Pulmonary effort is normal. No respiratory  distress.  Abdominal:     General: Abdomen is flat. There is no distension.  Musculoskeletal:        General: No swelling or tenderness. Normal range of motion.     Cervical back: Normal range of motion.  Skin:    General: Skin is warm and dry.     Coloration: Skin is not jaundiced or pale.  Neurological:     General: No focal deficit present.     Mental Status: He is alert.    ED Results / Procedures / Treatments   Labs (all labs ordered are listed, but only abnormal results are displayed) Labs Reviewed  COMPREHENSIVE METABOLIC PANEL - Abnormal; Notable for the following components:      Result Value   Sodium 133 (*)    CO2 21 (*)    Creatinine, Ser 1.72 (*)    Calcium 8.5 (*)    Albumin 2.9 (*)    AST 51 (*)    GFR, Estimated 41 (*)    All other components within normal limits  CBC WITH DIFFERENTIAL/PLATELET - Abnormal; Notable for the following components:   RBC 3.04 (*)    Hemoglobin 9.0 (*)    HCT 28.5 (*)    RDW 19.2 (*)    Platelets 36 (*)    nRBC 1.4 (*)    nRBC 2 (*)    All other components within normal limits  URINALYSIS, ROUTINE W REFLEX MICROSCOPIC - Abnormal; Notable for the following components:   Color, Urine AMBER (*)    APPearance CLOUDY (*)    Hgb urine dipstick MODERATE (*)    Protein, ur 100 (*)    Leukocytes,Ua TRACE (*)    All other components within normal limits  BRAIN NATRIURETIC PEPTIDE - Abnormal; Notable for the following components:   B Natriuretic Peptide 703.5 (*)    All other components within normal limits  TROPONIN I (HIGH SENSITIVITY) - Abnormal; Notable for the following components:   Troponin I (High Sensitivity) 32 (*)    All other components within normal limits    EKG None  Radiology DG Chest 2 View  Result Date: 12/08/2021 CLINICAL DATA:  Shortness of breath EXAM: CHEST - 2 VIEW COMPARISON:  None. FINDINGS: Borderline heart size with normal pulmonary vascularity. Linear opacities in the mid and lower lungs likely  representing fibrosis or linear atelectasis. No focal consolidation. No pleural effusions. No pneumothorax. Mediastinal contours appear intact. Degenerative changes in the spine. IMPRESSION: Linear atelectasis or fibrosis in the mid and lower lungs. Borderline heart size. No evidence of active pulmonary disease. Electronically Signed   By: Lucienne Capers M.D.   On: 12/08/2021 22:58  CT Angio Chest PE W and/or Wo Contrast  Result Date: 12/09/2021 CLINICAL DATA:  High probability for pulmonary embolism EXAM: CT ANGIOGRAPHY CHEST WITH CONTRAST TECHNIQUE: Multidetector CT imaging of the chest was performed using the standard protocol during bolus administration of intravenous contrast. Multiplanar CT image reconstructions and MIPs were obtained to evaluate the vascular anatomy. RADIATION DOSE REDUCTION: This exam was performed according to the departmental dose-optimization program which includes automated exposure control, adjustment of the mA and/or kV according to patient size and/or use of iterative reconstruction technique. CONTRAST:  104mL OMNIPAQUE IOHEXOL 350 MG/ML SOLN COMPARISON:  None. FINDINGS: Cardiovascular: Suboptimal but satisfactory opacification of the pulmonary arteries to the segmental level when accounting for intermittent motion and streak artifact. No evidence of pulmonary embolism. Enlarged heart size. No pericardial effusion. Mediastinum/Nodes: Bilateral axillary lymphadenopathy with homogeneous nodal density. Nodes measure up to 2.2 cm bilaterally. There is also intrathoracic adenopathy scattered throughout the mediastinum and affecting bilateral hila. Left anterior mediastinal or IMA chain node measures up to 2.2 cm long axis. Partial coverage of submental adenopathy. Lungs/Pleura: Patchy opacity in the dependent lungs likely reflecting atelectasis. No focal pneumonia or evidence of edema. No effusion or air leak. There is right lower lobe paramediastinal bandlike opacity which is likely  scarring and sided by vertebral osteophytes Upper Abdomen: Partial coverage of the spleen shows marked enlargement, measuring at least 18 x 11 cm. Musculoskeletal: Thoracic spondylosis and degeneration which is generalized. No acute or destructive process. Review of the MIP images confirms the above findings. IMPRESSION: 1. Axillary and mediastinal adenopathy with marked enlargement of the partially covered spleen, lymphoma is the leading concern. 2. Negative for pulmonary embolism. Electronically Signed   By: Jorje Guild M.D.   On: 12/09/2021 05:33   CT Renal Stone Study  Result Date: 12/08/2021 CLINICAL DATA:  Hematuria EXAM: CT ABDOMEN AND PELVIS WITHOUT CONTRAST TECHNIQUE: Multidetector CT imaging of the abdomen and pelvis was performed following the standard protocol without IV contrast. RADIATION DOSE REDUCTION: This exam was performed according to the departmental dose-optimization program which includes automated exposure control, adjustment of the mA and/or kV according to patient size and/or use of iterative reconstruction technique. COMPARISON:  None. FINDINGS: Lower chest: Trace left pleural effusion. Hypodense blood pool relative to myocardium, suggesting anemia. Hepatobiliary: Unenhanced liver is unremarkable. Gallbladder is unremarkable. No intrahepatic or extrahepatic ductal dilatation. Pancreas: Within normal limits. Spleen: Enlarged, measuring 22 in maximal craniocaudal dimension. Adrenals/Urinary Tract: Adrenal glands are within normal limits. Kidneys are within normal limits. No renal calculi or hydronephrosis. Thick-walled bladder, although underdistended. Stomach/Bowel: Stomach is notable for mild concentric wall thickening of the gastric antrum (series 3/image 32), although poorly evaluated on CT. No evidence of bowel obstruction. Normal appendix (series 3/image 56). Scattered colonic diverticulosis, without evidence of diverticulitis. Vascular/Lymphatic: No evidence of abdominal aortic  aneurysm. Abdominopelvic lymphadenopathy, poorly evaluated on unenhanced CT, including: --2.8 cm short axis node in the porta hepatis (series 3/image 30) --2.8 cm short axis right para-aortic node (series 3/image 46) --3.2 cm short axis left external iliac node (series 3/image 63) --2.8 cm short axis right external iliac node (series 3/image 72) --2.0 cm short axis left inguinal node (series 3/image 88) --1.8 cm short axis right inguinal node (series 3/image 93) Reproductive: Prostate is notable for fiducial markers. Associated presacral soft tissue thickening likely reflects radiation changes. Other: Small volume pelvic ascites. Musculoskeletal: Degenerative changes of the visualized thoracolumbar spine. No focal osseous lesions. IMPRESSION: Abdominopelvic lymphadenopathy with splenomegaly, suggesting lymphoma. Fiducial markers along  the prostate, suggesting radiation treatment for prostate cancer. Technically speaking, at least some of the lymphadenopathy could also be related to this diagnosis, although this would not account for splenomegaly. Thick-walled bladder, possibly reflecting radiation changes. Trace left pleural effusion. Electronically Signed   By: Julian Hy M.D.   On: 12/08/2021 23:58    Procedures Procedures    Medications Ordered in ED Medications  furosemide (LASIX) injection 80 mg (80 mg Intravenous Given 12/09/21 0421)  iohexol (OMNIPAQUE) 350 MG/ML injection 75 mL (75 mLs Intravenous Contrast Given 12/09/21 0525)    ED Course/ Medical Decision Making/ A&P                           Medical Decision Making Amount and/or Complexity of Data Reviewed Labs: ordered. Radiology: ordered.  Risk Prescription drug management. Decision regarding hospitalization.   76 year old male has worsening lymphadenopathy on CT scans and enlarged spleen concerning for lymphoma.  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  I think a lot of her shortness of breath is from CHF and he is diuresing pretty well from that standpoint however after speaking with Dr. Lindi Adie with oncology he wants the patient to be admitted to get a lymph node biopsies and other work-up and possibly his acute treatment for lymphoma if this happens to be aggressive he states that wait in a week for biopsy is not appropriate.  Consult placed for same   Final Clinical Impression(s) / ED Diagnoses Final diagnoses:  Thrombocytopenia ([place])  Lymphadenopathy    Rx / DC Orders ED Discharge Orders     None         Dee Paden, Corene Cornea, MD 12/09/21 [phone]

## 2021-12-09 NOTE — Anesthesia Preprocedure Evaluation (Addendum)
Anesthesia Evaluation  Patient identified by MRN, date of birth, ID band Patient awake    Reviewed: Allergy & Precautions, NPO status , Patient's Chart, lab work & pertinent test results  History of Anesthesia Complications Negative for: history of anesthetic complications  Airway Mallampati: II  TM Distance: >3 FB Neck ROM: Full    Dental  (+) Dental Advisory Given   Pulmonary neg pulmonary ROS, former smoker,    Pulmonary exam normal        Cardiovascular hypertension, Normal cardiovascular exam     Neuro/Psych PSYCHIATRIC DISORDERS Anxiety ptsd negative neurological ROS     GI/Hepatic Neg liver ROS, GERD  ,  Endo/Other  negative endocrine ROS  Renal/GU negative Renal ROS     Musculoskeletal negative musculoskeletal ROS (+)   Abdominal   Peds  Hematology  (+) Blood dyscrasia, , platetetsl 36K   Anesthesia Other Findings   Reproductive/Obstetrics                            Anesthesia Physical Anesthesia Plan  ASA: 3  Anesthesia Plan: General   Post-op Pain Management: Tylenol PO (pre-op)   Induction: Intravenous  PONV Risk Score and Plan: 3 and Ondansetron, Dexamethasone and Treatment may vary due to age or medical condition  Airway Management Planned: LMA  Additional Equipment:   Intra-op Plan:   Post-operative Plan: Extubation in OR  Informed Consent: I have reviewed the patients History and Physical, chart, labs and discussed the procedure including the risks, benefits and alternatives for the proposed anesthesia with the patient or authorized representative who has indicated his/her understanding and acceptance.     Dental advisory given  Plan Discussed with: Anesthesiologist and CRNA  Anesthesia Plan Comments:        Anesthesia Quick Evaluation

## 2021-12-09 NOTE — Assessment & Plan Note (Signed)
-  Continue Lipitor °

## 2021-12-09 NOTE — Consult Note (Signed)
Consult Note  Jerry Tran 1946/05/30  [id number].    Requesting MD: Dr. Lorin Mercy Chief Complaint/Reason for Consult: lymphadenopathy, lymph node biopsy  HPI:  76 year old male who presented to the ED due to hematuria. He has had progressively worsening, weakness, decreased appetite, malaise, and weight loss over the last few weeks. He has been scheduled for lymph node biopsy through the [place] but this has not been done yet.  Work up in ED significant for CT scan showing: "1. Axillary and mediastinal adenopathy with marked enlargement of the partially covered spleen, lymphoma is the leading concern."  General surgery consulted in regard to lymph node biopsy. He is not on anticoagulation  ROS: Review of Systems  Constitutional:  Positive for malaise/fatigue and weight loss.  HENT: Negative.    Eyes: Negative.   Respiratory:  Positive for cough and shortness of breath.   Cardiovascular:  Positive for leg swelling.  Gastrointestinal:  Positive for diarrhea and nausea.  Genitourinary:  Positive for hematuria.  Musculoskeletal:  Positive for joint pain (LLE arthritis).  Skin: Negative.    Family History  Problem Relation Age of Onset   Cancer Neg Hx     Past Medical History:  Diagnosis Date   Arthritis    GERD (gastroesophageal reflux disease)    Prostate cancer ([place]) 03/2021   s/p radiation therapy   PTSD (post-traumatic stress disorder)     History reviewed. No pertinent surgical history.  Social History:  reports that he has quit smoking. His smoking use included cigarettes. He has a 15.00 pack-year smoking history. He has never used smokeless tobacco. He reports that he does not currently use alcohol. He reports that he does not currently use drugs after having used the following drugs: Marijuana and Cocaine.  Allergies:  Allergies  Allergen Reactions   Sildenafil Other (See Comments)    Unknown reaction - reported by [facility]    (Not in a hospital  admission)   Blood pressure 121/81, pulse 92, temperature 98.6 F (37 C), temperature source Oral, resp. rate 17, SpO2 92 %. Physical Exam: General: pleasant, chronically ill appearing male who is laying in bed in NAD HEENT: head is normocephalic, atraumatic.  Sclera are noninjected.  Pupils equal and round. EOMs intact.  Ears and nose without any masses or lesions.  Mouth is pink and moist Heart: regular, rate, and rhythm.  Normal s1,s2. No obvious murmurs, gallops, or rubs noted.  Palpable radial and pedal pulses bilaterally; pitting edema of BLE, L>R Lungs: CTAB, no wheezes, rhonchi, or rales noted.  Respiratory effort nonlabored Abd: soft, protuberant, NT, ND, +BS, no masses or hernias MSK: all 4 extremities are symmetrical with no cyanosis, clubbing, or edema. Lymph: there is a large network of mobile supraclavicular nodes on the right, there are also enlarged, palpable inguinal and axillary nodes bilaterally that are deeper. Nodes are non-tender.  Skin: warm and dry with no masses, lesions, or rashes Neuro: Cranial nerves 2-12 grossly intact, sensation is normal throughout Psych: A&Ox3 with an appropriate affect.    Results for orders placed or performed during the hospital encounter of 12/08/21 (from the past 48 hour(s))  Comprehensive metabolic panel     Status: Abnormal   Collection Time: 12/08/21 10:23 PM  Result Value Ref Range   Sodium 133 (L) 135 - 145 mmol/L   Potassium 3.7 3.5 - 5.1 mmol/L   Chloride 105 98 - 111 mmol/L   CO2 21 (L) 22 - 32 mmol/L   Glucose,  Bld 94 70 - 99 mg/dL    Comment: Glucose reference range applies only to samples taken after fasting for at least 8 hours.   BUN 19 8 - 23 mg/dL   Creatinine, Ser 1.72 (H) 0.61 - 1.24 mg/dL   Calcium 8.5 (L) 8.9 - 10.3 mg/dL   Total Protein 7.4 6.5 - 8.1 g/dL   Albumin 2.9 (L) 3.5 - 5.0 g/dL   AST 51 (H) 15 - 41 U/L   ALT 15 0 - 44 U/L   Alkaline Phosphatase 91 38 - 126 U/L   Total Bilirubin 1.1 0.3 - 1.2 mg/dL    GFR, Estimated 41 (L) >60 mL/min    Comment: (NOTE) Calculated using the CKD-EPI Creatinine Equation (2021)    Anion gap 7 5 - 15    Comment: Performed at [facility], [place] [address], [place], [place] [address]  CBC with Differential     Status: Abnormal   Collection Time: 12/08/21 10:23 PM  Result Value Ref Range   WBC 4.3 4.0 - 10.5 K/uL   RBC 3.04 (L) 4.22 - 5.81 MIL/uL   Hemoglobin 9.0 (L) 13.0 - 17.0 g/dL   HCT 28.5 (L) 39.0 - 52.0 %   MCV 93.8 80.0 - 100.0 fL   MCH 29.6 26.0 - 34.0 pg   MCHC 31.6 30.0 - 36.0 g/dL   RDW 19.2 (H) 11.5 - 15.5 %   Platelets 36 (L) 150 - 400 K/uL    Comment: Immature Platelet Fraction may be clinically indicated, consider ordering this additional test [id number] REPEATED TO VERIFY PLATELET COUNT CONFIRMED BY SMEAR    nRBC 1.4 (H) 0.0 - 0.2 %   Neutrophils Relative % 66 %   Neutro Abs 2.8 1.7 - 7.7 K/uL   Lymphocytes Relative 32 %   Lymphs Abs 1.4 0.7 - 4.0 K/uL   Monocytes Relative 2 %   Monocytes Absolute 0.1 0.1 - 1.0 K/uL   Eosinophils Relative 0 %   Eosinophils Absolute 0.0 0.0 - 0.5 K/uL   Basophils Relative 0 %   Basophils Absolute 0.0 0.0 - 0.1 K/uL   WBC Morphology MORPHOLOGY UNREMARKABLE    Smear Review PLATELETS APPEAR DECREASED    nRBC 2 (H) 0 /100 WBC   Abs Immature Granulocytes 0.00 0.00 - 0.07 K/uL   Polychromasia PRESENT     Comment: Performed at [facility], [place] [address] [place], [place] [address]  Urinalysis, Routine w reflex microscopic Urine, Clean Catch     Status: Abnormal   Collection Time: 12/08/21 10:36 PM  Result Value Ref Range   Color, Urine AMBER (A) YELLOW    Comment: BIOCHEMICALS MAY BE AFFECTED BY COLOR   APPearance CLOUDY (A) CLEAR   Specific Gravity, Urine 1.019 1.005 - 1.030   pH 5.0 5.0 - 8.0   Glucose, UA NEGATIVE NEGATIVE mg/dL   Hgb urine dipstick MODERATE (A) NEGATIVE   Bilirubin Urine NEGATIVE NEGATIVE   Ketones, ur NEGATIVE NEGATIVE mg/dL   Protein, ur 100 (A) NEGATIVE  mg/dL   Nitrite NEGATIVE NEGATIVE   Leukocytes,Ua TRACE (A) NEGATIVE   RBC / HPF 0-5 0 - 5 RBC/hpf   WBC, UA 6-10 0 - 5 WBC/hpf   Bacteria, UA NONE SEEN NONE SEEN   Squamous Epithelial / LPF 0-5 0 - 5   Mucus PRESENT    Hyaline Casts, UA PRESENT     Comment: Performed at [facility], [address] [place], [place] [address]  Troponin I (  High Sensitivity)     Status: Abnormal   Collection Time: 12/09/21  3:12 AM  Result Value Ref Range   Troponin I (High Sensitivity) 32 (H) <18 ng/L    Comment: (NOTE) Elevated high sensitivity troponin I (hsTnI) values and significant  changes across serial measurements may suggest ACS but many other  chronic and acute conditions are known to elevate hsTnI results.  Refer to the "Links" section for chest pain algorithms and additional  guidance. Performed at [facility], [place] [address] [place], [place] [address]   Brain natriuretic peptide     Status: Abnormal   Collection Time: 12/09/21  3:12 AM  Result Value Ref Range   B Natriuretic Peptide 703.5 (H) 0.0 - 100.0 pg/mL    Comment: Performed at [facility] [address] [place], [place] [address]  Resp Panel by RT-PCR (Flu A&B, Covid) Nasopharyngeal Swab     Status: None   Collection Time: 12/09/21  8:07 AM   Specimen: Nasopharyngeal Swab; Nasopharyngeal(NP) swabs in vial transport medium  Result Value Ref Range   SARS Coronavirus 2 by RT PCR NEGATIVE NEGATIVE    Comment: (NOTE) SARS-CoV-2 target nucleic acids are NOT DETECTED.  The SARS-CoV-2 RNA is generally detectable in upper respiratory specimens during the acute phase of infection. The lowest concentration of SARS-CoV-2 viral copies this assay can detect is 138 copies/mL. A negative result does not preclude SARS-Cov-2 infection and should not be used as the sole basis for treatment or other patient management decisions. A negative result may occur with  improper specimen collection/handling, submission of  specimen other than nasopharyngeal swab, presence of viral mutation(s) within the areas targeted by this assay, and inadequate number of viral copies(<138 copies/mL). A negative result must be combined with clinical observations, patient history, and epidemiological information. The expected result is Negative.  Fact Sheet for Patients:  EntrepreneurPulse.com.au  Fact Sheet for Healthcare Providers:  IncredibleEmployment.be  This test is no t yet approved or cleared by the [place] FDA and  has been authorized for detection and/or diagnosis of SARS-CoV-2 by FDA under an Emergency Use Authorization (EUA). This EUA will remain  in effect (meaning this test can be used) for the duration of the COVID-19 declaration under Section 564(b)(1) of the Act, 21 U.S.C.section 360bbb-3(b)(1), unless the authorization is terminated  or revoked sooner.       Influenza A by PCR NEGATIVE NEGATIVE   Influenza B by PCR NEGATIVE NEGATIVE    Comment: (NOTE) The Xpert Xpress SARS-CoV-2/FLU/RSV plus assay is intended as an aid in the diagnosis of influenza from Nasopharyngeal swab specimens and should not be used as a sole basis for treatment. Nasal washings and aspirates are unacceptable for Xpert Xpress SARS-CoV-2/FLU/RSV testing.  Fact Sheet for Patients: EntrepreneurPulse.com.au  Fact Sheet for Healthcare Providers: IncredibleEmployment.be  This test is not yet approved or cleared by the [place] FDA and has been authorized for detection and/or diagnosis of SARS-CoV-2 by FDA under an Emergency Use Authorization (EUA). This EUA will remain in effect (meaning this test can be used) for the duration of the COVID-19 declaration under Section 564(b)(1) of the Act, 21 U.S.C. section 360bbb-3(b)(1), unless the authorization is terminated or revoked.  Performed at [facility], [place] [address] [place],  [place] [address]    DG Chest 2 View  Result Date: 12/08/2021 CLINICAL DATA:  Shortness of breath EXAM: CHEST - 2 VIEW COMPARISON:  None. FINDINGS: Borderline heart size with normal pulmonary  vascularity. Linear opacities in the mid and lower lungs likely representing fibrosis or linear atelectasis. No focal consolidation. No pleural effusions. No pneumothorax. Mediastinal contours appear intact. Degenerative changes in the spine. IMPRESSION: Linear atelectasis or fibrosis in the mid and lower lungs. Borderline heart size. No evidence of active pulmonary disease. Electronically Signed   By: Lucienne Capers M.D.   On: 12/08/2021 22:58   CT Angio Chest PE W and/or Wo Contrast  Result Date: 12/09/2021 CLINICAL DATA:  High probability for pulmonary embolism EXAM: CT ANGIOGRAPHY CHEST WITH CONTRAST TECHNIQUE: Multidetector CT imaging of the chest was performed using the standard protocol during bolus administration of intravenous contrast. Multiplanar CT image reconstructions and MIPs were obtained to evaluate the vascular anatomy. RADIATION DOSE REDUCTION: This exam was performed according to the departmental dose-optimization program which includes automated exposure control, adjustment of the mA and/or kV according to patient size and/or use of iterative reconstruction technique. CONTRAST:  36mL OMNIPAQUE IOHEXOL 350 MG/ML SOLN COMPARISON:  None. FINDINGS: Cardiovascular: Suboptimal but satisfactory opacification of the pulmonary arteries to the segmental level when accounting for intermittent motion and streak artifact. No evidence of pulmonary embolism. Enlarged heart size. No pericardial effusion. Mediastinum/Nodes: Bilateral axillary lymphadenopathy with homogeneous nodal density. Nodes measure up to 2.2 cm bilaterally. There is also intrathoracic adenopathy scattered throughout the mediastinum and affecting bilateral hila. Left anterior mediastinal or IMA chain node measures up to 2.2 cm long axis. Partial  coverage of submental adenopathy. Lungs/Pleura: Patchy opacity in the dependent lungs likely reflecting atelectasis. No focal pneumonia or evidence of edema. No effusion or air leak. There is right lower lobe paramediastinal bandlike opacity which is likely scarring and sided by vertebral osteophytes Upper Abdomen: Partial coverage of the spleen shows marked enlargement, measuring at least 18 x 11 cm. Musculoskeletal: Thoracic spondylosis and degeneration which is generalized. No acute or destructive process. Review of the MIP images confirms the above findings. IMPRESSION: 1. Axillary and mediastinal adenopathy with marked enlargement of the partially covered spleen, lymphoma is the leading concern. 2. Negative for pulmonary embolism. Electronically Signed   By: Jorje Guild M.D.   On: 12/09/2021 05:33   CT Renal Stone Study  Result Date: 12/08/2021 CLINICAL DATA:  Hematuria EXAM: CT ABDOMEN AND PELVIS WITHOUT CONTRAST TECHNIQUE: Multidetector CT imaging of the abdomen and pelvis was performed following the standard protocol without IV contrast. RADIATION DOSE REDUCTION: This exam was performed according to the departmental dose-optimization program which includes automated exposure control, adjustment of the mA and/or kV according to patient size and/or use of iterative reconstruction technique. COMPARISON:  None. FINDINGS: Lower chest: Trace left pleural effusion. Hypodense blood pool relative to myocardium, suggesting anemia. Hepatobiliary: Unenhanced liver is unremarkable. Gallbladder is unremarkable. No intrahepatic or extrahepatic ductal dilatation. Pancreas: Within normal limits. Spleen: Enlarged, measuring 22 in maximal craniocaudal dimension. Adrenals/Urinary Tract: Adrenal glands are within normal limits. Kidneys are within normal limits. No renal calculi or hydronephrosis. Thick-walled bladder, although underdistended. Stomach/Bowel: Stomach is notable for mild concentric wall thickening of the  gastric antrum (series 3/image 32), although poorly evaluated on CT. No evidence of bowel obstruction. Normal appendix (series 3/image 56). Scattered colonic diverticulosis, without evidence of diverticulitis. Vascular/Lymphatic: No evidence of abdominal aortic aneurysm. Abdominopelvic lymphadenopathy, poorly evaluated on unenhanced CT, including: --2.8 cm short axis node in the porta hepatis (series 3/image 30) --2.8 cm short axis right para-aortic node (series 3/image 46) --3.2 cm short axis left external iliac node (series 3/image 63) --2.8 cm short axis right external  iliac node (series 3/image 72) --2.0 cm short axis left inguinal node (series 3/image 88) --1.8 cm short axis right inguinal node (series 3/image 93) Reproductive: Prostate is notable for fiducial markers. Associated presacral soft tissue thickening likely reflects radiation changes. Other: Small volume pelvic ascites. Musculoskeletal: Degenerative changes of the visualized thoracolumbar spine. No focal osseous lesions. IMPRESSION: Abdominopelvic lymphadenopathy with splenomegaly, suggesting lymphoma. Fiducial markers along the prostate, suggesting radiation treatment for prostate cancer. Technically speaking, at least some of the lymphadenopathy could also be related to this diagnosis, although this would not account for splenomegaly. Thick-walled bladder, possibly reflecting radiation changes. Trace left pleural effusion. Electronically Signed   By: Julian Hy M.D.   On: 12/08/2021 23:58      Assessment/Plan Lymphadenopathy concerning for lymphoma - discussed options of proceeding with supraclavicular excisional LN biopsy vs core biopsy with IR. Patient and wife opt for excisional. Will proceed with OR lymph node biopsy of right supraclavicular nodes - risks and benefits discussed and patient in agreement to proceed  FEN: NPO ID: [email or web address] periop  I reviewed nursing notes, ED provider notes, hospitalist notes, last 24 h vitals and  pain scores, last 48 h intake and output, last 24 h labs and trends, and last 24 h imaging results.  This care required high  level of medical decision making.   Obie Dredge, [facility] Surgery 12/09/2021, 11:20 AM Please see Amion for pager number during day hours 7:00am-4:30pm

## 2021-12-09 NOTE — Anesthesia Procedure Notes (Signed)
Procedure Name: LMA Insertion Date/Time: 12/09/2021 1:05 PM Performed by: Carolan Clines, CRNA Pre-anesthesia Checklist: Patient identified, Emergency Drugs available, Suction available and Patient being monitored Patient Re-evaluated:Patient Re-evaluated prior to induction Oxygen Delivery Method: Circle System Utilized Preoxygenation: Pre-oxygenation with 100% oxygen Induction Type: IV induction Ventilation: Mask ventilation without difficulty LMA: LMA inserted LMA Size: 5.0 Number of attempts: 1 Airway Equipment and Method: Bite block Placement Confirmation: positive ETCO2 Tube secured with: Tape Dental Injury: Teeth and Oropharynx as per pre-operative assessment

## 2021-12-09 NOTE — Assessment & Plan Note (Addendum)
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

## 2021-12-09 NOTE — Transfer of Care (Signed)
Immediate Anesthesia Transfer of Care Note  Patient: Jerry Tran  Procedure(s) Performed: RIGHT SUPERCLAVICULAR LYMPH NODE EXCISION (Right)  Patient Location: PACU  Anesthesia Type:General  Level of Consciousness: awake  Airway & Oxygen Therapy: Patient Spontanous Breathing and Patient connected to face mask oxygen  Post-op Assessment: Report given to RN and Post -op Vital signs reviewed and stable  Post vital signs: Reviewed and stable  Last Vitals:  Vitals Value Taken Time  BP 108/77 12/09/21 1411  Temp    Pulse 83 12/09/21 1412  Resp 23 12/09/21 1412  SpO2 92 % 12/09/21 1412  Vitals shown include unvalidated device data.  Last Pain:  Vitals:   12/09/21 1226  TempSrc: Oral         Complications: No notable events documented.

## 2021-12-09 NOTE — Assessment & Plan Note (Signed)
-  s/p radiation therapy -Thought to be related to Agent Orange exposure -Appears to be stable at this time

## 2021-12-09 NOTE — Progress Notes (Signed)
Report given to [facility] 

## 2021-12-09 NOTE — Assessment & Plan Note (Addendum)
Prior Cr per record: 1.6--1.7 Continue to monitor on diuretics.  Resume home dose torsemide.  Creatinine is stable.

## 2021-12-09 NOTE — Consult Note (Addendum)
[place]  [phone] [phone]   MEDICAL ONCOLOGY - INITIAL CONSULTATION  Referral MD: Dr. Merrily Pew  Reason for Referral: Lymphadenopathy  HPI: Mr. Stolar is a 76 year old male with a past medical history significant for PTSD, prostate cancer status post radiation, GERD.  He presented to the emergency department with hematuria.  He was also having dizziness and shortness of breath.  Symptoms present for about 3 weeks.  He has been seen at the [place] was noted to have swollen glands and was scheduled for lymph node biopsy next week.  Reports an unintentional weight loss of 20 to 30 pounds since June.  Had night wets about 3 weeks ago.  Labs on admission showed a hemoglobin of 9.0, platelet count 36,000 (platelets were 76,000 on 09/11/2021), sodium 133, creatinine 1.72, albumin 2.9, AST 51.  CT renal stone study showed abdominal pelvic lymphadenopathy with splenomegaly suggesting lymphoma. CTA chest was performed which showed axillary and mediastinal adenopathy with marked enlargement of the partially covered spleen with lymphoma being the leading concern, negative for PE.  Medical oncology was asked to see the patient to make recommendations regarding his lymphadenopathy.    Past Medical History:  Diagnosis Date   Arthritis    GERD (gastroesophageal reflux disease)    Prostate cancer ([place]) 03/2021   s/p radiation therapy   PTSD (post-traumatic stress disorder)   :  History reviewed. No pertinent surgical history.:   No current facility-administered medications for this encounter.   Current Outpatient Medications  Medication Sig Dispense Refill   ammonium lactate (LAC-HYDRIN) 12 % lotion Apply 1 application topically 2 (two) times daily as needed for dry skin.     bisacodyl (DULCOLAX) 5 MG EC tablet Take 5 mg by mouth daily as needed for moderate constipation.     cholecalciferol (VITAMIN D3) 25 MCG (1000 UNIT) tablet Take 1,000 Units by mouth  daily.     diclofenac Sodium (VOLTAREN) 1 % GEL Apply 2 g topically 4 (four) times daily as needed (pain).     empagliflozin (JARDIANCE) 25 MG TABS tablet Take 12.5 mg by mouth daily.     flecainide (TAMBOCOR) 100 MG tablet Take 100 mg by mouth every 12 (twelve) hours.     fluticasone (FLONASE) 50 MCG/ACT nasal spray Place 1 spray into both nostrils daily as needed for allergies or rhinitis.     furosemide (LASIX) 20 MG tablet Take 20 mg by mouth as directed. Take 1 tablet Daily & Take 1 tablet if weight gain>5 lbs     hydroxypropyl methylcellulose / hypromellose (ISOPTO TEARS / GONIOVISC) 2.5 % ophthalmic solution Place 2 drops into both eyes in the morning and at bedtime.     meloxicam (MOBIC) 15 MG tablet Take 15 mg by mouth daily.     methocarbamol (ROBAXIN) 500 MG tablet Take 500 mg by mouth every 8 (eight) hours as needed for muscle spasms.     metoprolol succinate (TOPROL-XL) 50 MG 24 hr tablet Take 25 mg by mouth every evening. Take with or immediately following a meal.     omeprazole (PRILOSEC) 20 MG capsule Take 20 mg by mouth daily.     prazosin (MINIPRESS) 2 MG capsule Take 6 mg by mouth at bedtime.     terbinafine (LAMISIL) 1 % cream Apply 1 application topically 2 (two) times daily as needed (athlete's foot).     triamcinolone cream (KENALOG) 0.1 % Apply 1 application topically 2 (two) times daily as needed (rash).  No Known Allergies:   Family History  Problem Relation Age of Onset   Cancer Neg Hx   :   Social History   Socioeconomic History   Marital status: Widowed    Spouse name: Not on file   Number of children: Not on file   Years of education: Not on file   Highest education level: Not on file  Occupational History   Occupation: retired  Tobacco Use   Smoking status: Former    Packs/day: 1.00    Years: 15.00    Pack years: 15.00    Types: Cigarettes   Smokeless tobacco: Never  Substance and Sexual Activity   Alcohol use: Not Currently    Comment:  h/o heavy use   Drug use: Not Currently    Types: Marijuana, Cocaine    Comment: remote   Sexual activity: Not on file  Other Topics Concern   Not on file  Social History Narrative   Not on file   Social Determinants of Health   Financial Resource Strain: Not on file  Food Insecurity: Not on file  Transportation Needs: Not on file  Physical Activity: Not on file  Stress: Not on file  Social Connections: Not on file  Intimate Partner Violence: Not on file  :  Review of Systems: A comprehensive 14 point review of systems was negative except as noted in the HPI.  Exam: Patient Vitals for the past 24 hrs:  BP Temp Temp src Pulse Resp SpO2  12/09/21 0700 125/76 -- -- 87 20 97 %  12/09/21 0530 (!) 155/102 -- -- 95 16 98 %  12/09/21 0445 136/85 -- -- 74 16 98 %  12/09/21 0430 120/72 -- -- (!) 33 18 97 %  12/09/21 0400 125/81 -- -- 77 (!) 22 97 %  12/09/21 0345 (!) 131/94 -- -- 80 16 99 %  12/09/21 0330 121/88 -- -- (!) 50 -- 95 %  12/09/21 0315 114/72 -- -- 65 -- 99 %  12/09/21 0251 121/86 -- -- 87 18 100 %  12/08/21 2227 107/73 98.6 F (37 C) Oral (!) 47 18 100 %    General:  well-nourished in no acute distress.  Examined in stretcher Eyes:  no scleral icterus.   ENT:  There were no oropharyngeal lesions.   Neck was without thyromegaly.   Lymphatics:  + supraclavicular adenopathy  Respiratory: lungs were clear bilaterally without wheezing or crackles.   Cardiovascular:  Regular rate and rhythm, S1/S2, without murmur, rub or gallop.  There was no pedal edema.   GI: + splenomegatly noted in left upper quadrant Musculoskeletal:  no spinal tenderness of palpation of vertebral spine.   Skin exam was without echymosis, petichae.   Neuro exam was nonfocal. Patient was alert and oriented.  Attention was good.   Language was appropriate.  Mood was normal without depression.  Speech was not pressured.  Thought content was not tangential.     Lab Results  Component Value Date    WBC 4.3 12/08/2021   HGB 9.0 (L) 12/08/2021   HCT 28.5 (L) 12/08/2021   PLT 36 (L) 12/08/2021   GLUCOSE 94 12/08/2021   ALT 15 12/08/2021   AST 51 (H) 12/08/2021   NA 133 (L) 12/08/2021   K 3.7 12/08/2021   CL 105 12/08/2021   CREATININE 1.72 (H) 12/08/2021   BUN 19 12/08/2021   CO2 21 (L) 12/08/2021    DG Chest 2 View  Result Date: 12/08/2021 CLINICAL DATA:  Shortness of  breath EXAM: CHEST - 2 VIEW COMPARISON:  None. FINDINGS: Borderline heart size with normal pulmonary vascularity. Linear opacities in the mid and lower lungs likely representing fibrosis or linear atelectasis. No focal consolidation. No pleural effusions. No pneumothorax. Mediastinal contours appear intact. Degenerative changes in the spine. IMPRESSION: Linear atelectasis or fibrosis in the mid and lower lungs. Borderline heart size. No evidence of active pulmonary disease. Electronically Signed   By: Lucienne Capers M.D.   On: 12/08/2021 22:58   CT Angio Chest PE W and/or Wo Contrast  Result Date: 12/09/2021 CLINICAL DATA:  High probability for pulmonary embolism EXAM: CT ANGIOGRAPHY CHEST WITH CONTRAST TECHNIQUE: Multidetector CT imaging of the chest was performed using the standard protocol during bolus administration of intravenous contrast. Multiplanar CT image reconstructions and MIPs were obtained to evaluate the vascular anatomy. RADIATION DOSE REDUCTION: This exam was performed according to the departmental dose-optimization program which includes automated exposure control, adjustment of the mA and/or kV according to patient size and/or use of iterative reconstruction technique. CONTRAST:  41m OMNIPAQUE IOHEXOL 350 MG/ML SOLN COMPARISON:  None. FINDINGS: Cardiovascular: Suboptimal but satisfactory opacification of the pulmonary arteries to the segmental level when accounting for intermittent motion and streak artifact. No evidence of pulmonary embolism. Enlarged heart size. No pericardial effusion. Mediastinum/Nodes:  Bilateral axillary lymphadenopathy with homogeneous nodal density. Nodes measure up to 2.2 cm bilaterally. There is also intrathoracic adenopathy scattered throughout the mediastinum and affecting bilateral hila. Left anterior mediastinal or IMA chain node measures up to 2.2 cm long axis. Partial coverage of submental adenopathy. Lungs/Pleura: Patchy opacity in the dependent lungs likely reflecting atelectasis. No focal pneumonia or evidence of edema. No effusion or air leak. There is right lower lobe paramediastinal bandlike opacity which is likely scarring and sided by vertebral osteophytes Upper Abdomen: Partial coverage of the spleen shows marked enlargement, measuring at least 18 x 11 cm. Musculoskeletal: Thoracic spondylosis and degeneration which is generalized. No acute or destructive process. Review of the MIP images confirms the above findings. IMPRESSION: 1. Axillary and mediastinal adenopathy with marked enlargement of the partially covered spleen, lymphoma is the leading concern. 2. Negative for pulmonary embolism. Electronically Signed   By: JJorje GuildM.D.   On: 12/09/2021 05:33   CT Renal Stone Study  Result Date: 12/08/2021 CLINICAL DATA:  Hematuria EXAM: CT ABDOMEN AND PELVIS WITHOUT CONTRAST TECHNIQUE: Multidetector CT imaging of the abdomen and pelvis was performed following the standard protocol without IV contrast. RADIATION DOSE REDUCTION: This exam was performed according to the departmental dose-optimization program which includes automated exposure control, adjustment of the mA and/or kV according to patient size and/or use of iterative reconstruction technique. COMPARISON:  None. FINDINGS: Lower chest: Trace left pleural effusion. Hypodense blood pool relative to myocardium, suggesting anemia. Hepatobiliary: Unenhanced liver is unremarkable. Gallbladder is unremarkable. No intrahepatic or extrahepatic ductal dilatation. Pancreas: Within normal limits. Spleen: Enlarged, measuring 22  in maximal craniocaudal dimension. Adrenals/Urinary Tract: Adrenal glands are within normal limits. Kidneys are within normal limits. No renal calculi or hydronephrosis. Thick-walled bladder, although underdistended. Stomach/Bowel: Stomach is notable for mild concentric wall thickening of the gastric antrum (series 3/image 32), although poorly evaluated on CT. No evidence of bowel obstruction. Normal appendix (series 3/image 56). Scattered colonic diverticulosis, without evidence of diverticulitis. Vascular/Lymphatic: No evidence of abdominal aortic aneurysm. Abdominopelvic lymphadenopathy, poorly evaluated on unenhanced CT, including: --2.8 cm short axis node in the porta hepatis (series 3/image 30) --2.8 cm short axis right para-aortic node (series 3/image 46) --3.2  cm short axis left external iliac node (series 3/image 63) --2.8 cm short axis right external iliac node (series 3/image 72) --2.0 cm short axis left inguinal node (series 3/image 88) --1.8 cm short axis right inguinal node (series 3/image 93) Reproductive: Prostate is notable for fiducial markers. Associated presacral soft tissue thickening likely reflects radiation changes. Other: Small volume pelvic ascites. Musculoskeletal: Degenerative changes of the visualized thoracolumbar spine. No focal osseous lesions. IMPRESSION: Abdominopelvic lymphadenopathy with splenomegaly, suggesting lymphoma. Fiducial markers along the prostate, suggesting radiation treatment for prostate cancer. Technically speaking, at least some of the lymphadenopathy could also be related to this diagnosis, although this would not account for splenomegaly. Thick-walled bladder, possibly reflecting radiation changes. Trace left pleural effusion. Electronically Signed   By: Julian Hy M.D.   On: 12/08/2021 23:58     DG Chest 2 View  Result Date: 12/08/2021 CLINICAL DATA:  Shortness of breath EXAM: CHEST - 2 VIEW COMPARISON:  None. FINDINGS: Borderline heart size with  normal pulmonary vascularity. Linear opacities in the mid and lower lungs likely representing fibrosis or linear atelectasis. No focal consolidation. No pleural effusions. No pneumothorax. Mediastinal contours appear intact. Degenerative changes in the spine. IMPRESSION: Linear atelectasis or fibrosis in the mid and lower lungs. Borderline heart size. No evidence of active pulmonary disease. Electronically Signed   By: Lucienne Capers M.D.   On: 12/08/2021 22:58   CT Angio Chest PE W and/or Wo Contrast  Result Date: 12/09/2021 CLINICAL DATA:  High probability for pulmonary embolism EXAM: CT ANGIOGRAPHY CHEST WITH CONTRAST TECHNIQUE: Multidetector CT imaging of the chest was performed using the standard protocol during bolus administration of intravenous contrast. Multiplanar CT image reconstructions and MIPs were obtained to evaluate the vascular anatomy. RADIATION DOSE REDUCTION: This exam was performed according to the departmental dose-optimization program which includes automated exposure control, adjustment of the mA and/or kV according to patient size and/or use of iterative reconstruction technique. CONTRAST:  28m OMNIPAQUE IOHEXOL 350 MG/ML SOLN COMPARISON:  None. FINDINGS: Cardiovascular: Suboptimal but satisfactory opacification of the pulmonary arteries to the segmental level when accounting for intermittent motion and streak artifact. No evidence of pulmonary embolism. Enlarged heart size. No pericardial effusion. Mediastinum/Nodes: Bilateral axillary lymphadenopathy with homogeneous nodal density. Nodes measure up to 2.2 cm bilaterally. There is also intrathoracic adenopathy scattered throughout the mediastinum and affecting bilateral hila. Left anterior mediastinal or IMA chain node measures up to 2.2 cm long axis. Partial coverage of submental adenopathy. Lungs/Pleura: Patchy opacity in the dependent lungs likely reflecting atelectasis. No focal pneumonia or evidence of edema. No effusion or air  leak. There is right lower lobe paramediastinal bandlike opacity which is likely scarring and sided by vertebral osteophytes Upper Abdomen: Partial coverage of the spleen shows marked enlargement, measuring at least 18 x 11 cm. Musculoskeletal: Thoracic spondylosis and degeneration which is generalized. No acute or destructive process. Review of the MIP images confirms the above findings. IMPRESSION: 1. Axillary and mediastinal adenopathy with marked enlargement of the partially covered spleen, lymphoma is the leading concern. 2. Negative for pulmonary embolism. Electronically Signed   By: JJorje GuildM.D.   On: 12/09/2021 05:33   CT Renal Stone Study  Result Date: 12/08/2021 CLINICAL DATA:  Hematuria EXAM: CT ABDOMEN AND PELVIS WITHOUT CONTRAST TECHNIQUE: Multidetector CT imaging of the abdomen and pelvis was performed following the standard protocol without IV contrast. RADIATION DOSE REDUCTION: This exam was performed according to the departmental dose-optimization program which includes automated exposure control, adjustment of the  mA and/or kV according to patient size and/or use of iterative reconstruction technique. COMPARISON:  None. FINDINGS: Lower chest: Trace left pleural effusion. Hypodense blood pool relative to myocardium, suggesting anemia. Hepatobiliary: Unenhanced liver is unremarkable. Gallbladder is unremarkable. No intrahepatic or extrahepatic ductal dilatation. Pancreas: Within normal limits. Spleen: Enlarged, measuring 22 in maximal craniocaudal dimension. Adrenals/Urinary Tract: Adrenal glands are within normal limits. Kidneys are within normal limits. No renal calculi or hydronephrosis. Thick-walled bladder, although underdistended. Stomach/Bowel: Stomach is notable for mild concentric wall thickening of the gastric antrum (series 3/image 32), although poorly evaluated on CT. No evidence of bowel obstruction. Normal appendix (series 3/image 56). Scattered colonic diverticulosis,  without evidence of diverticulitis. Vascular/Lymphatic: No evidence of abdominal aortic aneurysm. Abdominopelvic lymphadenopathy, poorly evaluated on unenhanced CT, including: --2.8 cm short axis node in the porta hepatis (series 3/image 30) --2.8 cm short axis right para-aortic node (series 3/image 46) --3.2 cm short axis left external iliac node (series 3/image 63) --2.8 cm short axis right external iliac node (series 3/image 72) --2.0 cm short axis left inguinal node (series 3/image 88) --1.8 cm short axis right inguinal node (series 3/image 93) Reproductive: Prostate is notable for fiducial markers. Associated presacral soft tissue thickening likely reflects radiation changes. Other: Small volume pelvic ascites. Musculoskeletal: Degenerative changes of the visualized thoracolumbar spine. No focal osseous lesions. IMPRESSION: Abdominopelvic lymphadenopathy with splenomegaly, suggesting lymphoma. Fiducial markers along the prostate, suggesting radiation treatment for prostate cancer. Technically speaking, at least some of the lymphadenopathy could also be related to this diagnosis, although this would not account for splenomegaly. Thick-walled bladder, possibly reflecting radiation changes. Trace left pleural effusion. Electronically Signed   By: Julian Hy M.D.   On: 12/08/2021 23:58    Assessment and Plan:  1.  Lymphadenopathy 2.  Normocytic anemia 3.  Thrombocytopenia 4.  CKD 5.  Protein calorie malnutrition 6.  History of prostate cancer status post radiation 7.  PTSD 8.  GERD  Gershon Mussel is lymphadenopathy and splenomegaly are very concerning for lymphoma.  Since his labs seem to be worsening faster than anticipated I am concerned that we are possibly dealing with a high-grade lymphoma.  Therefore our recommendation is to proceed with STAT bone marrow biopsy and excisional lymph node biopsy to confirmed tissue diagnosis and guide treatment.  Recommend checking CBC with differential daily to  evaluate his blood counts and transfuse as indicated.  Once we confirm tissue diagnosis we can plan next steps.  He unfortunately did not get to proceed with his bone marrow biopsy today.  If he is ready for discharge this weekend, this can be performed as an outpatient if needed.  Other medical problems per the primary hospitalist team. Thank you for the referral of this very pleasant patient and his wife.  Wilber Bihari, NP 12/09/21 4:49 PM Medical Oncology and Hematology [facility] [place], [place] [address] Tel. [phone]    Fax. [phone]  Attending Note  I personally saw the patient, reviewed the chart and examined the patient. The plan of care was discussed with the patient and the admitting team. I agree with the assessment and plan as documented above. Thank you very much for the consultation. Extensive generalized lymphadenopathy and splenomegaly concerning for lymphoma: Status post excisional lymph node biopsy done yesterday.  Awaiting results. Symptoms of congestive heart failure with crackles and lower extremity edema: Currently on diuresis echocardiogram is being planned. Hematuria: Currently mild. Pancytopenia: I suspect it is related to underlying hematological disorder.  He will need outpatient bone marrow biopsy if he gets discharged. Patient is expressing his wishes to be discharged home and follow-up as an outpatient. He could be followed up at the [facility] or with her oncology clinic for his systemic treatment.

## 2021-12-09 NOTE — Assessment & Plan Note (Signed)
-  Continue prazosin

## 2021-12-09 NOTE — Plan of Care (Signed)
°  Problem: Clinical Measurements: Goal: Ability to maintain clinical measurements within normal limits will improve Outcome: Progressing Goal: Will remain free from infection Outcome: Progressing Goal: Cardiovascular complication will be avoided Outcome: Progressing   Problem: Activity: Goal: Risk for activity intolerance will decrease Outcome: Progressing   Problem: Nutrition: Goal: Adequate nutrition will be maintained Outcome: Progressing   Problem: Elimination: Goal: Will not experience complications related to bowel motility Outcome: Progressing Goal: Will not experience complications related to urinary retention Outcome: Progressing   Problem: Safety: Goal: Ability to remain free from injury will improve Outcome: Progressing   Problem: Clinical Measurements: Goal: Diagnostic test results will improve Outcome: Not Applicable

## 2021-12-09 NOTE — Assessment & Plan Note (Addendum)
-  Patient with diffuse symptoms including unintentional weight loss and night sweats presenting with diffuse LAD -Imaging confirms severe LAD, concerning for lymphoma -Oncology was consulted and recommends  LN biopsy.  -Per Dr Lindi Adie Bone Marrow Bx can be done out patient.  -Underwent Supraclavicular lymph node excision 2/10. Biopsy pending  Hb down, plan to transfuse one unit PRBC prior to discharge.  Report improvement of Dyspnea on exertion.

## 2021-12-09 NOTE — ED Notes (Signed)
Report given to Cecelia Byars, RN of short stay bay 36

## 2021-12-09 NOTE — Assessment & Plan Note (Addendum)
-  Continue flecainide, metoprolol -He is not on [facility] for unclear reasons - No plan to start Anticoagulation at this time due to thrombocytopenia.

## 2021-12-09 NOTE — Op Note (Addendum)
12/08/2021 - 12/09/2021  1:55 PM  PATIENT:  Jerry Tran  76 y.o. male  Patient Care Team: [facility] as PCP - General (General Practice)  PRE-OPERATIVE DIAGNOSIS:  Lymphadenopathy, concern for lymphoma  POST-OPERATIVE DIAGNOSIS:  Same  PROCEDURE:  Excision of right supraclavicular lymph node tissue  SURGEON:  Sharon Mt. Jeani Fassnacht, MD  ASSISTANT: OR staff  ANESTHESIA:   local and general  COUNTS:  Sponge, needle and instrument counts were reported correct x2 at the conclusion of the operation.  EBL: 20 mL  DRAINS: None  SPECIMEN: Right supraclavicular lymph node tissue - sent fresh  COMPLICATIONS: None  FINDINGS: Right supraclavicular conglomerate of necrotic lymph node tissue.  We excised lymph node tissue for pathologic evaluation.  This was sent fresh.  DISPOSITION: PACU in satisfactory condition  DESCRIPTION: The patient was identified in preop holding and taken to the OR where he was placed on the operating room table. SCDs were placed. General endotracheal anesthesia was induced without difficulty. He was positioned with his head turned slightly to the left and the neck slightly extended. The right neck and shoulder was then prepped and draped in the usual sterile fashion. A surgical timeout was performed indicating the correct patient, procedure, positioning and need for preoperative antibiotics.   The area of adenopathy in the right supraclavicular position is identified.  A skin incision is created and the subcutaneous tissue divided electrocautery.  The platysma was divided.  The supraclavicular lymphoid conglomerate is encountered.  This is a large matted area of somewhat necrotic lymph nodes.  We were able to excise lymphoid tissue using the harmonic scalpel to assist in sealing/hemostasis.  The specimen was passed off for fresh pathologic evaluation.  The wound is irrigated and hemostasis verified.  Powder Arista is placed in the surgical bed.  The wound  is closed in layers - closing the platysma and deep dermal layers with 3-0 Vicryl suture.  All sponge, needle, instrument counts were reported correct.  The skin is then closed with 4-0 Monocryl subcuticular suture.  Dermabond is applied.  He is then awakened from anesthesia, extubated, and transferred to a stretcher for transport to PACU in satisfactory condition having tolerated the procedure well.

## 2021-12-09 NOTE — H&P (Signed)
History and Physical    Patient: Jerry Tran EEF:[id number] DOB: 1946/08/06 DOA: 12/08/2021 DOS: the patient was seen and examined on 12/09/2021 PCP: [facility] [place]  Patient coming from: Home - lives with wife; Donald Prose: Wife, [phone]   Chief Complaint: Hematuria  HPI: Jerry Tran is a 76 y.o. male with medical history significant of PTSD, HLD, afib not on AC, HTN, prostate CA s/p radiation therapy, and GERD presenting with hematuria.  He noticed blood in his urine, dizziness, and SOB.  He has had these symptoms for about 3 weeks.  The symptoms all came on slowly at once and have been getting worse.  He was seen at a visit at the [place] and was noted to have swollen glands and so is scheduled for a lymph node biopsy next week.  +unintentional weight loss, 20-30 pounds since June.  No current night sweats but had them about 3 weeks ago.  Urine is dark red and blood appears to be in the urine.  No epistaxis or easy bruising.  No recent medication changes other than an antibiotic given last week for a stomach virus (H pylori diagnosed in a blood or urine test) - he was tested for this due to anorexia, fecal urgency.  +LE edema.  No orthopnea.  SOB is on exertion.    ER Course:  Possible lymphoma.  Weakness, DOE, and hematuria for a few weeks.  LAD on CT, due for lymph node biopsy next week at the [place].  ?mild CHF - diuresed with some improvement.  Hgb 9, platelets 36 - both worse.       Review of Systems: As mentioned in the history of present illness. All other systems reviewed and are negative. Past Medical History:  Diagnosis Date   Arthritis    Chronic kidney disease, stage 3b ([place]) 12/09/2021   Dyslipidemia    GERD (gastroesophageal reflux disease)    Hypertension    PAF (paroxysmal atrial fibrillation) (HCC)    Prostate cancer ([place]) 03/2021   s/p radiation therapy   PTSD (post-traumatic stress disorder)    History reviewed. No pertinent surgical history. Social History:   reports that he has quit smoking. His smoking use included cigarettes. He has a 15.00 pack-year smoking history. He has never used smokeless tobacco. He reports that he does not currently use alcohol. He reports that he does not currently use drugs after having used the following drugs: Marijuana and Cocaine.  Allergies  Allergen Reactions   Sildenafil Other (See Comments)    Unknown reaction - reported by [facility]    Family History  Problem Relation Age of Onset   Cancer Neg Hx     Prior to Admission medications   Medication Sig Start Date End Date Taking? Authorizing Provider  ammonium lactate (LAC-HYDRIN) 12 % lotion Apply 1 application topically 2 (two) times daily as needed for dry skin.    [provider]  bisacodyl (DULCOLAX) 5 MG EC tablet Take 5 mg by mouth daily as needed for moderate constipation.    [provider]  cholecalciferol (VITAMIN D3) 25 MCG (1000 UNIT) tablet Take 1,000 Units by mouth daily.    [provider]  diclofenac Sodium (VOLTAREN) 1 % GEL Apply 2 g topically 4 (four) times daily as needed (pain).    [provider]  empagliflozin (JARDIANCE) 25 MG TABS tablet Take 12.5 mg by mouth daily.    [provider]  flecainide (TAMBOCOR) 100 MG tablet Take 100 mg by mouth every 12 (  twelve) hours.    [provider]  fluticasone (FLONASE) 50 MCG/ACT nasal spray Place 1 spray into both nostrils daily as needed for allergies or rhinitis.    [provider]  furosemide (LASIX) 20 MG tablet Take 20 mg by mouth as directed. Take 1 tablet Daily & Take 1 tablet if weight gain>5 lbs    [provider]  hydroxypropyl methylcellulose / hypromellose (ISOPTO TEARS / GONIOVISC) 2.5 % ophthalmic solution Place 2 drops into both eyes in the morning and at bedtime.    [provider]  meloxicam (MOBIC) 15 MG tablet Take 15 mg by mouth daily.    [provider]  methocarbamol (ROBAXIN) 500 MG tablet  Take 500 mg by mouth every 8 (eight) hours as needed for muscle spasms.    [provider]  metoprolol succinate (TOPROL-XL) 50 MG 24 hr tablet Take 25 mg by mouth every evening. Take with or immediately following a meal.    [provider]  omeprazole (PRILOSEC) 20 MG capsule Take 20 mg by mouth daily.    [provider]  prazosin (MINIPRESS) 2 MG capsule Take 6 mg by mouth at bedtime.    [provider]  terbinafine (LAMISIL) 1 % cream Apply 1 application topically 2 (two) times daily as needed (athlete's foot).    [provider]  triamcinolone cream (KENALOG) 0.1 % Apply 1 application topically 2 (two) times daily as needed (rash).    [provider]    Physical Exam: Vitals:   12/09/21 1115 12/09/21 1130 12/09/21 1145 12/09/21 1226  BP: 116/79 (!) 118/92 132/88 130/89  Pulse: 64 74 76 94  Resp: (!) 36 19 20 18   Temp:    97.6 F (36.4 C)  TempSrc:    Oral  SpO2: 94% 96% 98% 97%  Weight:    98.4 kg  Height:    6\' 2"  (1.88 m)   General:  Appears calm and comfortable and is in NAD Eyes:  PERRL, EOMI, normal lids, iris ENT:  grossly normal hearing, lips & tongue, mmm Neck:  large conglomerate of LAD along R supraclavicular region, scattered other LAD Cardiovascular:  RRR, no m/r/g. No LE edema.  Respiratory:   CTA bilaterally with no wheezes/rales/rhonchi.  Normal respiratory effort. Abdomen:  soft, NT, ND Skin:  no rash or induration seen on limited exam Musculoskeletal:  grossly normal tone BUE/BLE, good ROM, no bony abnormality Psychiatric:  blunted mood and affect, speech fluent and appropriate, AOx3 Neurologic:  CN 2-12 grossly intact, moves all extremities in coordinated fashion   Radiological Exams on Admission: Independently reviewed - see discussion in A/P where applicable  DG Chest 2 View  Result Date: 12/08/2021 CLINICAL DATA:  Shortness of breath EXAM: CHEST - 2 VIEW COMPARISON:  None. FINDINGS: Borderline heart  size with normal pulmonary vascularity. Linear opacities in the mid and lower lungs likely representing fibrosis or linear atelectasis. No focal consolidation. No pleural effusions. No pneumothorax. Mediastinal contours appear intact. Degenerative changes in the spine. IMPRESSION: Linear atelectasis or fibrosis in the mid and lower lungs. Borderline heart size. No evidence of active pulmonary disease. Electronically Signed   By: Lucienne Capers M.D.   On: 12/08/2021 22:58   CT Angio Chest PE W and/or Wo Contrast  Result Date: 12/09/2021 CLINICAL DATA:  High probability for pulmonary embolism EXAM: CT ANGIOGRAPHY CHEST WITH CONTRAST TECHNIQUE: Multidetector CT imaging of the chest was performed using the standard protocol during bolus administration of intravenous contrast. Multiplanar CT image  reconstructions and MIPs were obtained to evaluate the vascular anatomy. RADIATION DOSE REDUCTION: This exam was performed according to the departmental dose-optimization program which includes automated exposure control, adjustment of the mA and/or kV according to patient size and/or use of iterative reconstruction technique. CONTRAST:  31mL OMNIPAQUE IOHEXOL 350 MG/ML SOLN COMPARISON:  None. FINDINGS: Cardiovascular: Suboptimal but satisfactory opacification of the pulmonary arteries to the segmental level when accounting for intermittent motion and streak artifact. No evidence of pulmonary embolism. Enlarged heart size. No pericardial effusion. Mediastinum/Nodes: Bilateral axillary lymphadenopathy with homogeneous nodal density. Nodes measure up to 2.2 cm bilaterally. There is also intrathoracic adenopathy scattered throughout the mediastinum and affecting bilateral hila. Left anterior mediastinal or IMA chain node measures up to 2.2 cm long axis. Partial coverage of submental adenopathy. Lungs/Pleura: Patchy opacity in the dependent lungs likely reflecting atelectasis. No focal pneumonia or evidence of edema. No  effusion or air leak. There is right lower lobe paramediastinal bandlike opacity which is likely scarring and sided by vertebral osteophytes Upper Abdomen: Partial coverage of the spleen shows marked enlargement, measuring at least 18 x 11 cm. Musculoskeletal: Thoracic spondylosis and degeneration which is generalized. No acute or destructive process. Review of the MIP images confirms the above findings. IMPRESSION: 1. Axillary and mediastinal adenopathy with marked enlargement of the partially covered spleen, lymphoma is the leading concern. 2. Negative for pulmonary embolism. Electronically Signed   By: Jorje Guild M.D.   On: 12/09/2021 05:33   CT Renal Stone Study  Result Date: 12/08/2021 CLINICAL DATA:  Hematuria EXAM: CT ABDOMEN AND PELVIS WITHOUT CONTRAST TECHNIQUE: Multidetector CT imaging of the abdomen and pelvis was performed following the standard protocol without IV contrast. RADIATION DOSE REDUCTION: This exam was performed according to the departmental dose-optimization program which includes automated exposure control, adjustment of the mA and/or kV according to patient size and/or use of iterative reconstruction technique. COMPARISON:  None. FINDINGS: Lower chest: Trace left pleural effusion. Hypodense blood pool relative to myocardium, suggesting anemia. Hepatobiliary: Unenhanced liver is unremarkable. Gallbladder is unremarkable. No intrahepatic or extrahepatic ductal dilatation. Pancreas: Within normal limits. Spleen: Enlarged, measuring 22 in maximal craniocaudal dimension. Adrenals/Urinary Tract: Adrenal glands are within normal limits. Kidneys are within normal limits. No renal calculi or hydronephrosis. Thick-walled bladder, although underdistended. Stomach/Bowel: Stomach is notable for mild concentric wall thickening of the gastric antrum (series 3/image 32), although poorly evaluated on CT. No evidence of bowel obstruction. Normal appendix (series 3/image 56). Scattered colonic  diverticulosis, without evidence of diverticulitis. Vascular/Lymphatic: No evidence of abdominal aortic aneurysm. Abdominopelvic lymphadenopathy, poorly evaluated on unenhanced CT, including: --2.8 cm short axis node in the porta hepatis (series 3/image 30) --2.8 cm short axis right para-aortic node (series 3/image 46) --3.2 cm short axis left external iliac node (series 3/image 63) --2.8 cm short axis right external iliac node (series 3/image 72) --2.0 cm short axis left inguinal node (series 3/image 88) --1.8 cm short axis right inguinal node (series 3/image 93) Reproductive: Prostate is notable for fiducial markers. Associated presacral soft tissue thickening likely reflects radiation changes. Other: Small volume pelvic ascites. Musculoskeletal: Degenerative changes of the visualized thoracolumbar spine. No focal osseous lesions. IMPRESSION: Abdominopelvic lymphadenopathy with splenomegaly, suggesting lymphoma. Fiducial markers along the prostate, suggesting radiation treatment for prostate cancer. Technically speaking, at least some of the lymphadenopathy could also be related to this diagnosis, although this would not account for splenomegaly. Thick-walled bladder, possibly reflecting radiation changes. Trace left pleural effusion. Electronically Signed   By: Julian Hy  M.D.   On: 12/08/2021 23:58    EKG: Independently reviewed.  NSR with rate 81; nonspecific ST changes with no evidence of acute ischemia   Labs on Admission: I have personally reviewed the available labs and imaging studies at the time of the admission.  Pertinent labs:    BUN 19/Creatinine 1.72/GFR 41 - stable Albumin 2.9 AST 51/ALT 15 BNP 703.5 HS troponin 32 WBC 4.3 Hgb 9.0 Platelets 36 UA: moderate Hgb, trace LE, 100 protein    Assessment and Plan: * Lymphadenopathy- (present on admission) -Patient with diffuse symptoms including unintentional weight loss and night sweats presenting with diffuse LAD -Imaging  confirms severe LAD, concerning for lymphoma -Also with anemia and thrombocytopenia -He was due for lymph node biopsy next week -Oncology was consulted and recommends ASAP LN biopsy as well as BM biopsy -Surgery has been consulted and will perform the biopsy today -[facility] to [facility]-operatively  H. pylori infection- (present on admission) -Patient with 1/30 H pylori Ab positive test -He was treated with flagyl, bismuth, tetracycline, and omeprazole; this was changed to clarithromycin and flagyl due to cost but he has not been taking these medications -No apparent symptoms associated with this issue currently  Chronic kidney disease, stage 3b ([place])- (present on admission) -Appears to be stable at this time -Will follow  PAF (paroxysmal atrial fibrillation) ([place])- (present on admission) -Continue flecainide, metoprolol -He is not on [facility] for unclear reasons - if platelets normalize it may be reasonable to start this in the future  Dyslipidemia- (present on admission) -Continue Lipitor  Hypertension- (present on admission) -Continue Lopressor  PTSD (post-traumatic stress disorder)- (present on admission) -Continue prazosin  Prostate cancer ([place])- (present on admission) -s/p radiation therapy -Thought to be related to Agent Orange exposure -Appears to be stable at this time       Advance Care Planning:   Code Status: Full Code   Consults: Surgery; oncology  DVT Prophylaxis: SCDs  Family Communication: Wife was present throughout evaluation  Severity of Illness: The appropriate patient status for this patient is INPATIENT. Inpatient status is judged to be reasonable and necessary in order to provide the required intensity of service to ensure the patient's safety. The patient's presenting symptoms, physical exam findings, and initial radiographic and laboratory data in the context of their chronic comorbidities is felt to place them at high risk for further clinical  deterioration. Furthermore, it is not anticipated that the patient will be medically stable for discharge from the hospital within 2 midnights of admission.   * I certify that at the point of admission it is my clinical judgment that the patient will require inpatient hospital care spanning beyond 2 midnights from the point of admission due to high intensity of service, high risk for further deterioration and high frequency of surveillance required.*  Author: Karmen Bongo, MD 12/09/2021 12:48 PM  For on call review [email or web address].

## 2021-12-09 NOTE — Assessment & Plan Note (Addendum)
-  Continue Lopressor.  PRN Hydralazine.

## 2021-12-10 ENCOUNTER — Inpatient Hospital Stay (HOSPITAL_COMMUNITY): Payer: No Typology Code available for payment source

## 2021-12-10 DIAGNOSIS — R591 Generalized enlarged lymph nodes: Secondary | ICD-10-CM

## 2021-12-10 DIAGNOSIS — I509 Heart failure, unspecified: Secondary | ICD-10-CM

## 2021-12-10 DIAGNOSIS — R0602 Shortness of breath: Secondary | ICD-10-CM

## 2021-12-10 DIAGNOSIS — R778 Other specified abnormalities of plasma proteins: Secondary | ICD-10-CM

## 2021-12-10 LAB — CBC
HCT: 25.8 % — ABNORMAL LOW (ref 39.0–52.0)
Hemoglobin: 8 g/dL — ABNORMAL LOW (ref 13.0–17.0)
MCH: 29.5 pg (ref 26.0–34.0)
MCHC: 31 g/dL (ref 30.0–36.0)
MCV: 95.2 fL (ref 80.0–100.0)
Platelets: 41 10*3/uL — ABNORMAL LOW (ref 150–400)
RBC: 2.71 MIL/uL — ABNORMAL LOW (ref 4.22–5.81)
RDW: 19.5 % — ABNORMAL HIGH (ref 11.5–15.5)
WBC: 2.7 10*3/uL — ABNORMAL LOW (ref 4.0–10.5)
nRBC: 0 % (ref 0.0–0.2)

## 2021-12-10 LAB — BASIC METABOLIC PANEL
Anion gap: 7 (ref 5–15)
BUN: 25 mg/dL — ABNORMAL HIGH (ref 8–23)
CO2: 23 mmol/L (ref 22–32)
Calcium: 8 mg/dL — ABNORMAL LOW (ref 8.9–10.3)
Chloride: 105 mmol/L (ref 98–111)
Creatinine, Ser: 1.45 mg/dL — ABNORMAL HIGH (ref 0.61–1.24)
GFR, Estimated: 50 mL/min — ABNORMAL LOW (ref >60)
Glucose, Bld: 108 mg/dL — ABNORMAL HIGH (ref 70–99)
Potassium: 3.8 mmol/L (ref 3.5–5.1)
Sodium: 135 mmol/L (ref 135–145)

## 2021-12-10 LAB — ECHOCARDIOGRAM COMPLETE
Area-P 1/2: 4.12 cm2
Calc EF: 55.3 %
Height: 76 in
S' Lateral: 3.5 cm
Single Plane A2C EF: 57.8 %
Single Plane A4C EF: 51.9 %
Weight: 3410.96 oz

## 2021-12-10 LAB — TROPONIN I (HIGH SENSITIVITY)
Troponin I (High Sensitivity): 33 ng/L — ABNORMAL HIGH (ref <18)
Troponin I (High Sensitivity): 34 ng/L — ABNORMAL HIGH (ref <18)

## 2021-12-10 MED ORDER — FUROSEMIDE 10 MG/ML IJ SOLN
40.0000 mg | Freq: Once | INTRAMUSCULAR | Status: AC
  Administered 2021-12-10: 40 mg via INTRAVENOUS
  Filled 2021-12-10: qty 4

## 2021-12-10 MED ORDER — POTASSIUM CHLORIDE CRYS ER 20 MEQ PO TBCR
40.0000 meq | EXTENDED_RELEASE_TABLET | Freq: Once | ORAL | Status: AC
  Administered 2021-12-10: 40 meq via ORAL
  Filled 2021-12-10: qty 2

## 2021-12-10 NOTE — Progress Notes (Signed)
°  Echocardiogram 2D Echocardiogram has been performed.  Jerry Tran 12/10/2021, 3:15 PM

## 2021-12-10 NOTE — Anesthesia Postprocedure Evaluation (Signed)
Anesthesia Post Note  Patient: Jerry Tran  Procedure(s) Performed: RIGHT SUPERCLAVICULAR LYMPH NODE EXCISION (Right)     Patient location during evaluation: PACU Anesthesia Type: General Level of consciousness: sedated Pain management: pain level controlled Vital Signs Assessment: post-procedure vital signs reviewed and stable Respiratory status: spontaneous breathing and respiratory function stable Cardiovascular status: stable Postop Assessment: no apparent nausea or vomiting Anesthetic complications: no   No notable events documented.                [name] DANIEL

## 2021-12-10 NOTE — Assessment & Plan Note (Signed)
Mild elevation of troponin, at 32.  Suspect related to heart failure exacerbation.  Follow  trend.

## 2021-12-10 NOTE — Progress Notes (Signed)
1 Day Post-Op   Subjective/Chief Complaint: Doing well no issues after surgery   Objective: Vital signs in last 24 hours: Temp:  [97.5 F (36.4 C)-98.3 F (36.8 C)] 97.5 F (36.4 C) (02/11 0345) Pulse Rate:  [64-94] 73 (02/11 0345) Resp:  [14-36] 18 (02/11 0345) BP: (90-135)/(56-92) 113/81 (02/11 0345) SpO2:  [87 %-100 %] 95 % (02/11 0345) Weight:  [96.7 kg-98.4 kg] 96.7 kg (02/10 1938) Last BM Date: 12/09/21  Intake/Output from previous day: 02/10 0701 - 02/11 0700 In: 600 [I.V.:600] Out: 1680 [Urine:1650; Blood:30] Intake/Output this shift: No intake/output data recorded.  Right neck incision healing well without infection  Lab Results:  Recent Labs    12/09/21 1935 12/10/21 0540  WBC 4.6 2.7*  HGB 8.8* 8.0*  HCT 28.9* 25.8*  PLT 44* 41*   BMET Recent Labs    12/08/21 2223 12/10/21 0540  NA 133* 135  K 3.7 3.8  CL 105 105  CO2 21* 23  GLUCOSE 94 108*  BUN 19 25*  CREATININE 1.72* 1.45*  CALCIUM 8.5* 8.0*   PT/INR No results for input(s): LABPROT, INR in the last 72 hours. ABG No results for input(s): PHART, HCO3 in the last 72 hours.  Invalid input(s): PCO2, PO2  Studies/Results: DG Chest 2 View  Result Date: 12/08/2021 CLINICAL DATA:  Shortness of breath EXAM: CHEST - 2 VIEW COMPARISON:  None. FINDINGS: Borderline heart size with normal pulmonary vascularity. Linear opacities in the mid and lower lungs likely representing fibrosis or linear atelectasis. No focal consolidation. No pleural effusions. No pneumothorax. Mediastinal contours appear intact. Degenerative changes in the spine. IMPRESSION: Linear atelectasis or fibrosis in the mid and lower lungs. Borderline heart size. No evidence of active pulmonary disease. Electronically Signed   By: Lucienne Capers M.D.   On: 12/08/2021 22:58   CT Angio Chest PE W and/or Wo Contrast  Result Date: 12/09/2021 CLINICAL DATA:  High probability for pulmonary embolism EXAM: CT ANGIOGRAPHY CHEST WITH CONTRAST  TECHNIQUE: Multidetector CT imaging of the chest was performed using the standard protocol during bolus administration of intravenous contrast. Multiplanar CT image reconstructions and MIPs were obtained to evaluate the vascular anatomy. RADIATION DOSE REDUCTION: This exam was performed according to the departmental dose-optimization program which includes automated exposure control, adjustment of the mA and/or kV according to patient size and/or use of iterative reconstruction technique. CONTRAST:  69mL OMNIPAQUE IOHEXOL 350 MG/ML SOLN COMPARISON:  None. FINDINGS: Cardiovascular: Suboptimal but satisfactory opacification of the pulmonary arteries to the segmental level when accounting for intermittent motion and streak artifact. No evidence of pulmonary embolism. Enlarged heart size. No pericardial effusion. Mediastinum/Nodes: Bilateral axillary lymphadenopathy with homogeneous nodal density. Nodes measure up to 2.2 cm bilaterally. There is also intrathoracic adenopathy scattered throughout the mediastinum and affecting bilateral hila. Left anterior mediastinal or IMA chain node measures up to 2.2 cm long axis. Partial coverage of submental adenopathy. Lungs/Pleura: Patchy opacity in the dependent lungs likely reflecting atelectasis. No focal pneumonia or evidence of edema. No effusion or air leak. There is right lower lobe paramediastinal bandlike opacity which is likely scarring and sided by vertebral osteophytes Upper Abdomen: Partial coverage of the spleen shows marked enlargement, measuring at least 18 x 11 cm. Musculoskeletal: Thoracic spondylosis and degeneration which is generalized. No acute or destructive process. Review of the MIP images confirms the above findings. IMPRESSION: 1. Axillary and mediastinal adenopathy with marked enlargement of the partially covered spleen, lymphoma is the leading concern. 2. Negative for pulmonary embolism. Electronically Signed  By: Jorje Guild M.D.   On: 12/09/2021  05:33   CT Renal Stone Study  Result Date: 12/08/2021 CLINICAL DATA:  Hematuria EXAM: CT ABDOMEN AND PELVIS WITHOUT CONTRAST TECHNIQUE: Multidetector CT imaging of the abdomen and pelvis was performed following the standard protocol without IV contrast. RADIATION DOSE REDUCTION: This exam was performed according to the departmental dose-optimization program which includes automated exposure control, adjustment of the mA and/or kV according to patient size and/or use of iterative reconstruction technique. COMPARISON:  None. FINDINGS: Lower chest: Trace left pleural effusion. Hypodense blood pool relative to myocardium, suggesting anemia. Hepatobiliary: Unenhanced liver is unremarkable. Gallbladder is unremarkable. No intrahepatic or extrahepatic ductal dilatation. Pancreas: Within normal limits. Spleen: Enlarged, measuring 22 in maximal craniocaudal dimension. Adrenals/Urinary Tract: Adrenal glands are within normal limits. Kidneys are within normal limits. No renal calculi or hydronephrosis. Thick-walled bladder, although underdistended. Stomach/Bowel: Stomach is notable for mild concentric wall thickening of the gastric antrum (series 3/image 32), although poorly evaluated on CT. No evidence of bowel obstruction. Normal appendix (series 3/image 56). Scattered colonic diverticulosis, without evidence of diverticulitis. Vascular/Lymphatic: No evidence of abdominal aortic aneurysm. Abdominopelvic lymphadenopathy, poorly evaluated on unenhanced CT, including: --2.8 cm short axis node in the porta hepatis (series 3/image 30) --2.8 cm short axis right para-aortic node (series 3/image 46) --3.2 cm short axis left external iliac node (series 3/image 63) --2.8 cm short axis right external iliac node (series 3/image 72) --2.0 cm short axis left inguinal node (series 3/image 88) --1.8 cm short axis right inguinal node (series 3/image 93) Reproductive: Prostate is notable for fiducial markers. Associated presacral soft  tissue thickening likely reflects radiation changes. Other: Small volume pelvic ascites. Musculoskeletal: Degenerative changes of the visualized thoracolumbar spine. No focal osseous lesions. IMPRESSION: Abdominopelvic lymphadenopathy with splenomegaly, suggesting lymphoma. Fiducial markers along the prostate, suggesting radiation treatment for prostate cancer. Technically speaking, at least some of the lymphadenopathy could also be related to this diagnosis, although this would not account for splenomegaly. Thick-walled bladder, possibly reflecting radiation changes. Trace left pleural effusion. Electronically Signed   By: Julian Hy M.D.   On: 12/08/2021 23:58    Anti-infectives: Anti-infectives (From admission, onward)    Start     Dose/Rate Route Frequency Ordered Stop   12/09/21 1400  ceFAZolin (ANCEF) IVPB 2g/100 mL premix  Status:  Discontinued        2 g 200 mL/hr over 30 Minutes Intravenous Every 8 hours 12/09/21 1143 12/09/21 1143   12/09/21 1200  ceFAZolin (ANCEF) IVPB 2g/100 mL premix        2 g 200 mL/hr over 30 Minutes Intravenous On call to O.R. 12/09/21 1143 12/09/21 1337       Assessment/Plan: POD 1 right [place] node biopsy -shower today -will sign off  Rolm Bookbinder 12/10/2021

## 2021-12-10 NOTE — Hospital Course (Addendum)
76 year old past medical history significant for PTSD, HLD, A-fib not on anticoagulation, hypertension, prostate cancer s/p radiation therapy and GERD presented with hematuria.  He complain blood in the urine, dizziness and shortness of breath.  He has had the symptoms for 3 weeks.  Unintentional weight loss 20 to 30 pounds since June.  Recently started treatment for H. pylori.  Patient admitted for progressive weakness, shortness of breath on exertion, hematuria.  Underwent lymph node excision 2/10 by general surgery.  Oncology consulted. Patient also  admitted with mild CHF exacerbation, pancytopenia. Patient was treated with diuresis.  He will be discharged on torsemide.  He will receive 1 unit of packed red blood cells prior to discharge for anemia.  He will need to follow-up with hematology oncology for the results of lymph node biopsy.  He will also need a bone marrow biopsy.

## 2021-12-10 NOTE — H&P (Signed)
Chief Complaint: Patient was seen in consultation today for  Chief Complaint  Patient presents with   Hematuria   at the request of * No referring provider recorded for this case *  Referring Physician(s): * No referring provider recorded for this case *  Supervising Physician: Mir, Sharen Heck  Patient Status: [facility] - In-pt  History of Present Illness: Jerry Tran is a 76 y.o. male with medical history significant of PTSD, HLD, afib not on AC, HTN, prostate CA s/p radiation therapy, and GERD presenting with hematuria.  He noticed blood in his urine, dizziness, and SOB.  He has had these symptoms for about 3 weeks.  The symptoms all came on slowly at once and have been getting worse.  He was seen at a visit at the [place] and was noted to have swollen glands.  +unintentional weight loss, 20-30 pounds since June.  No current night sweats but had them about 3 weeks ago.  Urine is dark red and blood appears to be in the urine.  No epistaxis or easy bruising.  No recent medication changes other than an antibiotic given last week for a stomach virus (H pylori diagnosed in a blood or urine test) - he was tested for this due to anorexia, fecal urgency.  +LE edema.  No orthopnea.  SOB is on exertion.  Oncology reports lymphadenopathy and splenomegaly are very concerning for lymphoma.  With rapidly worsening labs, requests stat bone marrow biopsy to confirm diagnosis and guide treatment.  Patient is expressing his wishes to be discharged home and follow-up as an outpatient but will confer with wife about this.     Past Medical History:  Diagnosis Date   Arthritis    Chronic kidney disease, stage 3b ([place]) 12/09/2021   Dyslipidemia    GERD (gastroesophageal reflux disease)    Hypertension    PAF (paroxysmal atrial fibrillation) (HCC)    Prostate cancer ([place]) 03/2021   s/p radiation therapy   PTSD (post-traumatic stress disorder)     History reviewed. No pertinent surgical  history.  Allergies: Sildenafil  Medications: Prior to Admission medications   Medication Sig Start Date End Date Taking? Authorizing Provider  ammonium lactate (LAC-HYDRIN) 12 % lotion Apply 1 application topically 2 (two) times daily as needed for dry skin.   Yes [provider]  atorvastatin (LIPITOR) 20 MG tablet Take 10 mg by mouth at bedtime.   Yes [provider]  Cholecalciferol (VITAMIN D3 PO) Take 1 tablet by mouth every morning.   Yes [provider]  diclofenac Sodium (VOLTAREN) 1 % GEL Apply 2 g topically 4 (four) times daily as needed (pain).   Yes [provider]  flecainide (TAMBOCOR) 100 MG tablet Take 100 mg by mouth every 12 (twelve) hours.   Yes [provider]  fluticasone (FLONASE) 50 MCG/ACT nasal spray Place 1 spray into both nostrils daily as needed for allergies or rhinitis.   Yes [provider]  hydroxypropyl methylcellulose / hypromellose (ISOPTO TEARS / GONIOVISC) 2.5 % ophthalmic solution Place 2 drops into both eyes 2 (two) times daily as needed for dry eyes.   Yes [provider]  methocarbamol (ROBAXIN) 500 MG tablet Take 500 mg by mouth every 8 (eight) hours as needed for muscle spasms.   Yes [provider]  metoprolol tartrate (LOPRESSOR) 25 MG tablet Take 12.5 mg by mouth 2 (two) times daily. Take with flecainide   Yes [provider]  omeprazole (PRILOSEC) 20 MG capsule Take 20 mg  by mouth daily before breakfast.   Yes [provider]  prazosin (MINIPRESS) 2 MG capsule Take 6 mg by mouth at bedtime.   Yes [provider]  terbinafine (LAMISIL) 1 % cream Apply 1 application topically 2 (two) times daily as needed (athlete's foot).   Yes [provider]  torsemide (DEMADEX) 10 MG tablet Take 10 mg by mouth See admin instructions. Take one tablet (10 mg) by mouth every morning, take an extra tablet (10 mg) for weight gain of 5 lbs or more   Yes [provider]  triamcinolone cream (KENALOG) 0.1 % Apply 1 application topically 2 (two) times daily as needed (rash).   Yes [provider]  clarithromycin (BIAXIN) 500 MG tablet SMARTSIG:1 Tablet(s) By Mouth Every 12 Hours Patient not taking: Reported on 12/09/2021 12/05/21   [provider]  metoprolol succinate (TOPROL-XL) 50 MG 24 hr tablet Take 25 mg by mouth every evening. Take with or immediately following a meal. Patient not taking: Reported on 12/09/2021    [provider]  metroNIDAZOLE (FLAGYL) 500 MG tablet Take 500 mg by mouth 4 (four) times daily. Patient not taking: Reported on 12/09/2021 11/28/21   [provider]     Family History  Problem Relation Age of Onset   Cancer Neg Hx     Social History   Socioeconomic History   Marital status: Widowed    Spouse name: Not on file   Number of children: Not on file   Years of education: Not on file   Highest education level: Not on file  Occupational History   Occupation: retired  Tobacco Use   Smoking status: Former    Packs/day: 1.00    Years: 15.00    Pack years: 15.00    Types: Cigarettes   Smokeless tobacco: Never  Substance and Sexual Activity   Alcohol use: Not Currently    Comment: h/o heavy use   Drug use: Not Currently    Types: Marijuana, Cocaine    Comment: remote   Sexual activity: Not on file  Other Topics Concern   Not on file  Social History Narrative   Not on file   Social Determinants of Health   Financial Resource Strain: Not on file  Food Insecurity: Not on file  Transportation Needs: Not on file  Physical Activity: Not on file  Stress: Not on file  Social Connections: Not on file    Review of Systems: A 12 point ROS discussed and pertinent positives are indicated in the HPI above.  All other systems are negative.   Vital Signs: BP 109/71 (BP Location: Left Arm)    Pulse 77    Temp 98.5 F (36.9 C) (Oral)    Resp 20    Ht _0  (1.93 m)    Wt 213 lb  3 oz (96.7 kg)    SpO2 96%    BMI 25.95 kg/m   Physical Exam Constitutional:      General: He is not in acute distress. HENT:     Mouth/Throat:     Mouth: Mucous membranes are moist.     Pharynx: Oropharynx is clear.  Eyes:     Extraocular Movements: Extraocular movements intact.  Pulmonary:     Effort: Pulmonary effort is normal. No respiratory distress.     Comments: Adventitious breath sounds bilaterally  Abdominal:     Palpations: There is splenomegaly.  Musculoskeletal:     Right lower leg: Edema present.  Left lower leg: Edema present.  Lymphadenopathy:     Cervical: Cervical adenopathy present.  Skin:    General: Skin is dry.     Capillary Refill: Capillary refill takes less than 2 seconds.  Neurological:     General: No focal deficit present.     Mental Status: He is alert and oriented to person, place, and time.  Psychiatric:        Mood and Affect: Mood normal.        Behavior: Behavior normal.    Imaging: DG Chest 2 View  Result Date: 12/08/2021 CLINICAL DATA:  Shortness of breath EXAM: CHEST - 2 VIEW COMPARISON:  None. FINDINGS: Borderline heart size with normal pulmonary vascularity. Linear opacities in the mid and lower lungs likely representing fibrosis or linear atelectasis. No focal consolidation. No pleural effusions. No pneumothorax. Mediastinal contours appear intact. Degenerative changes in the spine. IMPRESSION: Linear atelectasis or fibrosis in the mid and lower lungs. Borderline heart size. No evidence of active pulmonary disease. Electronically Signed   By: Lucienne Capers M.D.   On: 12/08/2021 22:58   CT Angio Chest PE W and/or Wo Contrast  Result Date: 12/09/2021 CLINICAL DATA:  High probability for pulmonary embolism EXAM: CT ANGIOGRAPHY CHEST WITH CONTRAST TECHNIQUE: Multidetector CT imaging of the chest was performed using the standard protocol during bolus administration of intravenous contrast. Multiplanar CT image reconstructions and MIPs  were obtained to evaluate the vascular anatomy. RADIATION DOSE REDUCTION: This exam was performed according to the departmental dose-optimization program which includes automated exposure control, adjustment of the mA and/or kV according to patient size and/or use of iterative reconstruction technique. CONTRAST:  35m OMNIPAQUE IOHEXOL 350 MG/ML SOLN COMPARISON:  None. FINDINGS: Cardiovascular: Suboptimal but satisfactory opacification of the pulmonary arteries to the segmental level when accounting for intermittent motion and streak artifact. No evidence of pulmonary embolism. Enlarged heart size. No pericardial effusion. Mediastinum/Nodes: Bilateral axillary lymphadenopathy with homogeneous nodal density. Nodes measure up to 2.2 cm bilaterally. There is also intrathoracic adenopathy scattered throughout the mediastinum and affecting bilateral hila. Left anterior mediastinal or IMA chain node measures up to 2.2 cm long axis. Partial coverage of submental adenopathy. Lungs/Pleura: Patchy opacity in the dependent lungs likely reflecting atelectasis. No focal pneumonia or evidence of edema. No effusion or air leak. There is right lower lobe paramediastinal bandlike opacity which is likely scarring and sided by vertebral osteophytes Upper Abdomen: Partial coverage of the spleen shows marked enlargement, measuring at least 18 x 11 cm. Musculoskeletal: Thoracic spondylosis and degeneration which is generalized. No acute or destructive process. Review of the MIP images confirms the above findings. IMPRESSION: 1. Axillary and mediastinal adenopathy with marked enlargement of the partially covered spleen, lymphoma is the leading concern. 2. Negative for pulmonary embolism. Electronically Signed   By: JJorje GuildM.D.   On: 12/09/2021 05:33   CT Renal Stone Study  Result Date: 12/08/2021 CLINICAL DATA:  Hematuria EXAM: CT ABDOMEN AND PELVIS WITHOUT CONTRAST TECHNIQUE: Multidetector CT imaging of the abdomen and pelvis  was performed following the standard protocol without IV contrast. RADIATION DOSE REDUCTION: This exam was performed according to the departmental dose-optimization program which includes automated exposure control, adjustment of the mA and/or kV according to patient size and/or use of iterative reconstruction technique. COMPARISON:  None. FINDINGS: Lower chest: Trace left pleural effusion. Hypodense blood pool relative to myocardium, suggesting anemia. Hepatobiliary: Unenhanced liver is unremarkable. Gallbladder is unremarkable. No intrahepatic or extrahepatic ductal dilatation. Pancreas: Within normal limits. Spleen:  Enlarged, measuring 22 in maximal craniocaudal dimension. Adrenals/Urinary Tract: Adrenal glands are within normal limits. Kidneys are within normal limits. No renal calculi or hydronephrosis. Thick-walled bladder, although underdistended. Stomach/Bowel: Stomach is notable for mild concentric wall thickening of the gastric antrum (series 3/image 32), although poorly evaluated on CT. No evidence of bowel obstruction. Normal appendix (series 3/image 56). Scattered colonic diverticulosis, without evidence of diverticulitis. Vascular/Lymphatic: No evidence of abdominal aortic aneurysm. Abdominopelvic lymphadenopathy, poorly evaluated on unenhanced CT, including: --2.8 cm short axis node in the porta hepatis (series 3/image 30) --2.8 cm short axis right para-aortic node (series 3/image 46) --3.2 cm short axis left external iliac node (series 3/image 63) --2.8 cm short axis right external iliac node (series 3/image 72) --2.0 cm short axis left inguinal node (series 3/image 88) --1.8 cm short axis right inguinal node (series 3/image 93) Reproductive: Prostate is notable for fiducial markers. Associated presacral soft tissue thickening likely reflects radiation changes. Other: Small volume pelvic ascites. Musculoskeletal: Degenerative changes of the visualized thoracolumbar spine. No focal osseous lesions.  IMPRESSION: Abdominopelvic lymphadenopathy with splenomegaly, suggesting lymphoma. Fiducial markers along the prostate, suggesting radiation treatment for prostate cancer. Technically speaking, at least some of the lymphadenopathy could also be related to this diagnosis, although this would not account for splenomegaly. Thick-walled bladder, possibly reflecting radiation changes. Trace left pleural effusion. Electronically Signed   By: Julian Hy M.D.   On: 12/08/2021 23:58    Labs:  CBC: Recent Labs    09/11/21 1215 12/08/21 2223 12/09/21 1935 12/10/21 0540  WBC 5.0 4.3 4.6 2.7*  HGB 10.4* 9.0* 8.8* 8.0*  HCT 33.2* 28.5* 28.9* 25.8*  PLT 76* 36* 44* 41*    COAGS: No results for input(s): INR, APTT in the last 8760 hours.  BMP: Recent Labs    09/11/21 1215 12/08/21 2223 12/10/21 0540  NA 139 133* 135  K 3.7 3.7 3.8  CL 105 105 105  CO2 26 21* 23  GLUCOSE 114* 94 108*  BUN 37* 19 25*  CALCIUM 9.1 8.5* 8.0*  CREATININE 1.85* 1.72* 1.45*  GFRNONAA 38* 41* 50*    LIVER FUNCTION TESTS: Recent Labs    09/11/21 1215 12/08/21 2223  BILITOT 0.7 1.1  AST 29 51*  ALT 18 15  ALKPHOS 95 91  PROT 8.1 7.4  ALBUMIN 3.4* 2.9*     Assessment and Plan:  Lymphadenopathy Concern for lymphoma --Bone marrow bx tentatively for early next week. --NPO at midnight 2/13 If patient discharges prior to biopsy, can be ordered and scheduled as an outpatient case.  Risks and benefits of bone marrow biopsy was discussed with the patient and/or patient's family including, but not limited to bleeding, infection, damage to adjacent structures or low yield requiring additional tests.  All of the questions were answered and there is agreement to proceed.  Consent signed and in IR.   Thank you for this interesting consult.  I greatly enjoyed meeting Jerry Tran and look forward to participating in their care.  A copy of this report was sent to the requesting provider on this  date.  Electronically Signed: Pasty Spillers, PA 12/10/2021, 12:13 PM   I spent a total of 40 Minutes  in face to face in clinical consultation, greater than 50% of which was counseling/coordinating care for bone marrow biopsy

## 2021-12-10 NOTE — Progress Notes (Addendum)
Progress Note   Patient: Jerry Tran YPP:[id number] DOB: 1946/10/04 DOA: 12/08/2021     1 DOS: the patient was seen and examined on 12/10/2021   Brief hospital course: 76 year old past medical history significant for PTSD, HLD, A-fib not on anticoagulation, hypertension, prostate cancer s/p radiation therapy and GERD presented with hematuria.  He complain blood in the urine, dizziness and shortness of breath.  He has had the symptoms for 3 weeks.  Unintentional weight loss 20 to 30 pounds since June.  Recently started treatment for H. pylori.  Patient admitted for progressive weakness, shortness of breath on exertion, hematuria.  Underwent lymph node excision 2/10 by general surgery.  Oncology consulted. Patient also  admitted with mild CHF exacerbation, pancytopenia.    Assessment and Plan: * Lymphadenopathy- (present on admission) -Patient with diffuse symptoms including unintentional weight loss and night sweats presenting with diffuse LAD -Imaging confirms severe LAD, concerning for lymphoma -Oncology was consulted and recommends ASAP LN biopsy.  -Per Dr Lindi Adie Bone Marrow Bx can be done out patient if discharge before Monday.  -Underwent Supraclavicular lymph node excision 2/10. Biopsy pending  WBC and Hb down today. Platelet stable.    Troponin level elevated Mild elevation of troponin, at 32.  Suspect related to heart failure exacerbation.  Follow  trend.  Heart failure ([place]) Present with SOB, LE edema, Elevation BNP 700. Chest x ray: borderline heart zise. Crackles Lung exam.  Suspect acute on chronic diastolic HF exacerbation. No echo available. Will check ECHO.  Received 80 mg IV lasix in the ED>  Plan to give 40 mg IV BID two doses.  Repeat labs in am.  Urine out ut yesterday: 1.6. Plan to resume Home dose of torsemide tomorrow.      H. pylori infection- (present on admission) -Patient with 1/30 H pylori Ab positive test -He was treated with flagyl, bismuth,  tetracycline, and omeprazole; this was changed to clarithromycin and flagyl due to cost but he has not been taking these medications -No active issues.   Chronic kidney disease, stage 3b ([place])- (present on admission) Prior Cr per record: 1.6--1.7 Continue to monitor on diuretics.   PAF (paroxysmal atrial fibrillation) ([place])- (present on admission) -Continue flecainide, metoprolol -He is not on [facility] for unclear reasons - No plan to start Anticoagulation at this time due to thrombocytopenia.   Dyslipidemia- (present on admission) -Continue Lipitor  Hypertension- (present on admission) -Continue Lopressor.  PRN Hydralazine.   PTSD (post-traumatic stress disorder)- (present on admission) -Continue prazosin  Prostate cancer ([place])- (present on admission) -s/p radiation therapy -Thought to be related to Agent Orange exposure -Appears to be stable at this time        Subjective:  He is feeling better, dyspnea has improved.  Physical Exam: Vitals:   12/09/21 1940 12/10/21 0002 12/10/21 0345 12/10/21 0812  BP: (!) 116/56 (!) 90/57 113/81 109/71  Pulse: 73 71 73 77  Resp: 18 17 18 20   Temp: 98.3 F (36.8 C) 98 F (36.7 C) (!) 97.5 F (36.4 C) 98.5 F (36.9 C)  TempSrc: Oral Oral Oral Oral  SpO2: 96% 94% 95% 96%  Weight:      Height:       General; very pleasant, no acute distress Cardiovascular: S1-S2 regular rhythm and rate Lungs : Bilateral crackles Lower extremity: +2 edema  Data Reviewed:  CBC, Bmet  reviewed  Family Communication: Care discussed with patient.  Disposition: Status is: Inpatient Remains inpatient appropriate because: Plan to repeat IV Lasix today, will proceed  with 2D echo.          Planned Discharge Destination: Home     Time spent: 45 minutes  Author: Elmarie Shiley, MD 12/10/2021 2:14 PM  For on call review [email or web address].

## 2021-12-11 ENCOUNTER — Encounter (HOSPITAL_COMMUNITY): Payer: Self-pay | Admitting: Surgery

## 2021-12-11 DIAGNOSIS — R319 Hematuria, unspecified: Secondary | ICD-10-CM

## 2021-12-11 DIAGNOSIS — I5033 Acute on chronic diastolic (congestive) heart failure: Secondary | ICD-10-CM

## 2021-12-11 LAB — BASIC METABOLIC PANEL
Anion gap: 6 (ref 5–15)
BUN: 23 mg/dL (ref 8–23)
CO2: 24 mmol/L (ref 22–32)
Calcium: 7.8 mg/dL — ABNORMAL LOW (ref 8.9–10.3)
Chloride: 103 mmol/L (ref 98–111)
Creatinine, Ser: 1.73 mg/dL — ABNORMAL HIGH (ref 0.61–1.24)
GFR, Estimated: 41 mL/min — ABNORMAL LOW (ref >60)
Glucose, Bld: 95 mg/dL (ref 70–99)
Potassium: 3.9 mmol/L (ref 3.5–5.1)
Sodium: 133 mmol/L — ABNORMAL LOW (ref 135–145)

## 2021-12-11 LAB — CBC
HCT: 24.7 % — ABNORMAL LOW (ref 39.0–52.0)
Hemoglobin: 7.7 g/dL — ABNORMAL LOW (ref 13.0–17.0)
MCH: 29.8 pg (ref 26.0–34.0)
MCHC: 31.2 g/dL (ref 30.0–36.0)
MCV: 95.7 fL (ref 80.0–100.0)
Platelets: 44 10*3/uL — ABNORMAL LOW (ref 150–400)
RBC: 2.58 MIL/uL — ABNORMAL LOW (ref 4.22–5.81)
RDW: 19.8 % — ABNORMAL HIGH (ref 11.5–15.5)
WBC: 3.4 10*3/uL — ABNORMAL LOW (ref 4.0–10.5)
nRBC: 0.6 % — ABNORMAL HIGH (ref 0.0–0.2)

## 2021-12-11 LAB — ABO/RH: ABO/RH(D): B POS

## 2021-12-11 LAB — PREPARE RBC (CROSSMATCH)

## 2021-12-11 MED ORDER — PROSOURCE PLUS PO LIQD: 30.0000 mL | Freq: Two times a day (BID) | ORAL | Status: DC

## 2021-12-11 MED ORDER — FUROSEMIDE 10 MG/ML IJ SOLN
20.0000 mg | Freq: Once | INTRAMUSCULAR | Status: AC
  Administered 2021-12-11: 20 mg via INTRAVENOUS
  Filled 2021-12-11: qty 2

## 2021-12-11 MED ORDER — SODIUM CHLORIDE 0.9% IV SOLUTION: Freq: Once | INTRAVENOUS | Status: DC

## 2021-12-11 MED ORDER — ENSURE ENLIVE PO LIQD: 237.0000 mL | Freq: Two times a day (BID) | ORAL | Status: DC

## 2021-12-11 MED ORDER — TORSEMIDE 10 MG PO TABS
10.0000 mg | ORAL_TABLET | Freq: Every day | ORAL | Status: DC
  Administered 2021-12-11: 10 mg via ORAL
  Filled 2021-12-11: qty 1

## 2021-12-11 MED ORDER — ENSURE ENLIVE PO LIQD: 237.0000 mL | Freq: Two times a day (BID) | ORAL | 12 refills | Status: DC

## 2021-12-11 MED ORDER — ADULT MULTIVITAMIN W/MINERALS CH: 1.0000 | ORAL_TABLET | Freq: Every day | ORAL | Status: DC

## 2021-12-11 MED ORDER — TORSEMIDE 10 MG PO TABS: 10.0000 mg | ORAL_TABLET | ORAL | 0 refills | Status: DC

## 2021-12-11 NOTE — Progress Notes (Signed)
Patient stated that he was short of breath after ambulating to the restroom. Vital signs taken, 02, 97%, placed [place] in room for patient, if needed.

## 2021-12-11 NOTE — Assessment & Plan Note (Signed)
Suspect related to thrombocytopenia. Resolved.

## 2021-12-11 NOTE — Progress Notes (Signed)
Patient discharged home with wife, discharge instructions reviewed with patient who verbalized understanding.

## 2021-12-11 NOTE — Discharge Summary (Addendum)
Physician Discharge Summary   Patient: Jerry Tran MRN: [id number] DOB: 1946/04/24  Admit date:     12/08/2021  Discharge date: 12/11/21  Discharge Physician: Elmarie Shiley   PCP: [place]   Recommendations at discharge:   Need Bone Marrow Biopsy.  Needs further adjustment of diuretics for HF.  Needs follow up with oncology for further results of Lymph node Bx.   Discharge Diagnoses: Principal Problem:   Lymphadenopathy Active Problems:   Prostate cancer ([place])   PTSD (post-traumatic stress disorder)   Hypertension   Dyslipidemia   PAF (paroxysmal atrial fibrillation) (HCC)   Chronic kidney disease, stage 3b (HCC)   H. pylori infection   Heart failure (HCC)   Troponin level elevated   Acute on chronic diastolic CHF (congestive heart failure) (HCC)   Hematuria  Resolved Problems:   * No resolved hospital problems. *   Hospital Course: 76 year old past medical history significant for PTSD, HLD, A-fib not on anticoagulation, hypertension, prostate cancer s/p radiation therapy and GERD presented with hematuria.  He complain blood in the urine, dizziness and shortness of breath.  He has had the symptoms for 3 weeks.  Unintentional weight loss 20 to 30 pounds since June.  Recently started treatment for H. pylori.  Patient admitted for progressive weakness, shortness of breath on exertion, hematuria.  Underwent lymph node excision 2/10 by general surgery.  Oncology consulted. Patient also  admitted with mild CHF exacerbation, pancytopenia. Patient was treated with diuresis.  He will be discharged on torsemide.  He will receive 1 unit of packed red blood cells prior to discharge for anemia.  He will need to follow-up with hematology oncology for the results of lymph node biopsy.  He will also need a bone marrow biopsy.    Assessment and Plan: Biopsy return with Mantel Cell Lymphoma.  * Lymphadenopathy- (present on admission) -Patient with diffuse symptoms  including unintentional weight loss and night sweats presenting with diffuse LAD -Imaging confirms severe LAD, concerning for lymphoma -Oncology was consulted and recommends  LN biopsy.  -Per Dr Lindi Adie Bone Marrow Bx can be done out patient.  -Underwent Supraclavicular lymph node excision 2/10. Biopsy pending  Hb down, plan to transfuse one unit PRBC prior to discharge.  Report improvement of Dyspnea on exertion.    Hematuria Suspect related to thrombocytopenia. Resolved.   Acute on chronic diastolic CHF (congestive heart failure) ([place]) Present with SOB, LE edema, Elevation BNP 700. Chest x ray: borderline heart zise. Crackles Lung exam.  ECHO; Normal EF, Diastolic Dysfunction grade 1, Pulmonary HTN. [facility].  Received 80 mg IV lasix in the ED>  Received 40 Mg IV lasix BID>  Urine out ut yesterday: 1.6. Resume home dose torsemide.  Check oxygen on ambulation.     Troponin level elevated Mild elevation of troponin, at 32.  Suspect related to heart failure exacerbation.  Follow  trend.  Heart failure (HCC)    H. pylori infection- (present on admission) -Patient with 1/30 H pylori Ab positive test -He was treated with flagyl, bismuth, tetracycline, and omeprazole; this was changed to clarithromycin and flagyl due to cost but he has not been taking these medications -No active issues.   Chronic kidney disease, stage 3b ([place])- (present on admission) Prior Cr per record: 1.6--1.7 Continue to monitor on diuretics.  Resume home dose torsemide.  Creatinine is stable.  PAF (paroxysmal atrial fibrillation) ([place])- (present on admission) -Continue flecainide, metoprolol -  He is not on [facility] for unclear reasons - No plan to start Anticoagulation at this time due to thrombocytopenia.   Dyslipidemia- (present on admission) -Continue Lipitor  Hypertension- (present on admission) -Continue Lopressor.  PRN Hydralazine.   PTSD  (post-traumatic stress disorder)- (present on admission) -Continue prazosin  Prostate cancer ([place])- (present on admission) -s/p radiation therapy -Thought to be related to Agent Orange exposure -Appears to be stable at this time         Pain control - [employer] Controlled Substance Reporting System database was reviewed. and patient was instructed, not to drive, operate heavy machinery, perform activities at heights, swimming or participation in water activities or provide baby-sitting services while on Pain, Sleep and Anxiety Medications; until their outpatient Physician has advised to do so again. Also recommended to not to take more than prescribed Pain, Sleep and Anxiety Medications.   Consultants: oncology , IR, surgery  Procedures performed: Lymphadenopathy excision  Disposition: Home Diet recommendation:  Discharge Diet Orders (From admission, onward)     Start     Ordered   12/11/21 0000  Diet - low sodium heart healthy        12/11/21 0937           Cardiac diet  DISCHARGE MEDICATION: Allergies as of 12/11/2021       Reactions   Sildenafil Other (See Comments)   Unknown reaction - reported by [facility]        Medication List     STOP taking these medications    clarithromycin 500 MG tablet Commonly known as: BIAXIN   metoprolol succinate 50 MG 24 hr tablet Commonly known as: TOPROL-XL   metroNIDAZOLE 500 MG tablet Commonly known as: FLAGYL       TAKE these medications    ammonium lactate 12 % lotion Commonly known as: LAC-HYDRIN Apply 1 application topically 2 (two) times daily as needed for dry skin.   atorvastatin 20 MG tablet Commonly known as: LIPITOR Take 10 mg by mouth at bedtime.   diclofenac Sodium 1 % Gel Commonly known as: VOLTAREN Apply 2 g topically 4 (four) times daily as needed (pain).   feeding supplement Liqd Take 237 mLs by mouth 2 (two) times daily between meals.   flecainide 100 MG tablet Commonly known as:  TAMBOCOR Take 100 mg by mouth every 12 (twelve) hours.   fluticasone 50 MCG/ACT nasal spray Commonly known as: FLONASE Place 1 spray into both nostrils daily as needed for allergies or rhinitis.   hydroxypropyl methylcellulose / hypromellose 2.5 % ophthalmic solution Commonly known as: ISOPTO TEARS / GONIOVISC Place 2 drops into both eyes 2 (two) times daily as needed for dry eyes.   methocarbamol 500 MG tablet Commonly known as: ROBAXIN Take 500 mg by mouth every 8 (eight) hours as needed for muscle spasms.   metoprolol tartrate 25 MG tablet Commonly known as: LOPRESSOR Take 12.5 mg by mouth 2 (two) times daily. Take with flecainide   omeprazole 20 MG capsule Commonly known as: PRILOSEC Take 20 mg by mouth daily before breakfast.   prazosin 2 MG capsule Commonly known as: MINIPRESS Take 6 mg by mouth at bedtime.   terbinafine 1 % cream Commonly known as: LAMISIL Apply 1 application topically 2 (two) times daily as needed (athlete's foot).   torsemide 10 MG tablet Commonly known as: DEMADEX Take 10 mg by mouth See admin instructions. Take one tablet (10 mg) by mouth every morning, take an extra tablet (10 mg) for weight gain  of 5 lbs or more   triamcinolone cream 0.1 % Commonly known as: KENALOG Apply 1 application topically 2 (two) times daily as needed (rash).   VITAMIN D3 PO Take 1 tablet by mouth every morning.         Follow-up Information     [facility], [place] Follow up in 2 week(s).   Specialty: General Surgery Why: call for follow up appointment Contact information: [address] [place] [address] [phone]                 Discharge Exam: Danley Danker Weights   12/09/21 1226 12/09/21 1938  Weight: 98.4 kg 96.7 kg  Genera; NAD Lung; CTA Abdomen; soft, nt  Condition at discharge: stable  The results of significant diagnostics from this hospitalization (including imaging, microbiology, ancillary and laboratory) are  listed below for reference.   Imaging Studies: DG Chest 2 View  Result Date: 12/08/2021 CLINICAL DATA:  Shortness of breath EXAM: CHEST - 2 VIEW COMPARISON:  None. FINDINGS: Borderline heart size with normal pulmonary vascularity. Linear opacities in the mid and lower lungs likely representing fibrosis or linear atelectasis. No focal consolidation. No pleural effusions. No pneumothorax. Mediastinal contours appear intact. Degenerative changes in the spine. IMPRESSION: Linear atelectasis or fibrosis in the mid and lower lungs. Borderline heart size. No evidence of active pulmonary disease. Electronically Signed   By: Lucienne Capers M.D.   On: 12/08/2021 22:58   CT Angio Chest PE W and/or Wo Contrast  Result Date: 12/09/2021 CLINICAL DATA:  High probability for pulmonary embolism EXAM: CT ANGIOGRAPHY CHEST WITH CONTRAST TECHNIQUE: Multidetector CT imaging of the chest was performed using the standard protocol during bolus administration of intravenous contrast. Multiplanar CT image reconstructions and MIPs were obtained to evaluate the vascular anatomy. RADIATION DOSE REDUCTION: This exam was performed according to the departmental dose-optimization program which includes automated exposure control, adjustment of the mA and/or kV according to patient size and/or use of iterative reconstruction technique. CONTRAST:  22m OMNIPAQUE IOHEXOL 350 MG/ML SOLN COMPARISON:  None. FINDINGS: Cardiovascular: Suboptimal but satisfactory opacification of the pulmonary arteries to the segmental level when accounting for intermittent motion and streak artifact. No evidence of pulmonary embolism. Enlarged heart size. No pericardial effusion. Mediastinum/Nodes: Bilateral axillary lymphadenopathy with homogeneous nodal density. Nodes measure up to 2.2 cm bilaterally. There is also intrathoracic adenopathy scattered throughout the mediastinum and affecting bilateral hila. Left anterior mediastinal or IMA chain node measures up  to 2.2 cm long axis. Partial coverage of submental adenopathy. Lungs/Pleura: Patchy opacity in the dependent lungs likely reflecting atelectasis. No focal pneumonia or evidence of edema. No effusion or air leak. There is right lower lobe paramediastinal bandlike opacity which is likely scarring and sided by vertebral osteophytes Upper Abdomen: Partial coverage of the spleen shows marked enlargement, measuring at least 18 x 11 cm. Musculoskeletal: Thoracic spondylosis and degeneration which is generalized. No acute or destructive process. Review of the MIP images confirms the above findings. IMPRESSION: 1. Axillary and mediastinal adenopathy with marked enlargement of the partially covered spleen, lymphoma is the leading concern. 2. Negative for pulmonary embolism. Electronically Signed   By: JJorje GuildM.D.   On: 12/09/2021 05:33   ECHOCARDIOGRAM COMPLETE  Result Date: 12/10/2021    ECHOCARDIOGRAM REPORT   Patient Name:   TDIAMONTE STAVELYDate of Exam: 12/10/2021 Medical Rec #:  [id number]   Height:       76.0 in Accession #:    [id number]  Weight:       213.2 lb Date of Birth:  04-07-1946    BSA:          2.276 m Patient Age:    47 years     BP:           109/71 mmHg Patient Gender: M            HR:           71 bpm. Exam Location:  Inpatient Procedure: 2D Echo, 3D Echo, Cardiac Doppler and Color Doppler Indications:    R06.02 SOB  History:        Patient has no prior history of Echocardiogram examinations.                 Abnormal ECG, Arrythmias:Atrial Fibrillation,                 Signs/Symptoms:Dyspnea; Risk Factors:Hypertension, Dyslipidemia                 and Former Smoker. Cancer.  Sonographer:    Roseanna Rainbow RDCS Referring Phys: [id number] Abisai Deer A Keishawn Darsey IMPRESSIONS  1. Left ventricular ejection fraction, by estimation, is 55 to 60%. The left ventricle has normal function. The left ventricle has no regional wall motion abnormalities. Left ventricular diastolic parameters are consistent with Grade I  diastolic dysfunction (impaired relaxation).  2. Right ventricular systolic function is normal. The right ventricular size is mildly enlarged. There is moderately elevated pulmonary artery systolic pressure. The estimated right ventricular systolic pressure is 74.0 mmHg.  3. The mitral valve is grossly normal. Trivial mitral valve regurgitation.  4. The aortic valve is tricuspid. There is mild calcification of the aortic valve. There is mild thickening of the aortic valve. Aortic valve regurgitation is trivial. Aortic valve sclerosis/calcification is present, without any evidence of aortic stenosis.  5. Aortic dilatation noted. There is mild dilatation of the ascending aorta, measuring 37 mm.  6. The inferior vena cava is dilated in size with <50% respiratory variability, suggesting right atrial pressure of 15 mmHg.  7. Frequent PVCs during study Comparison(s): No prior Echocardiogram. FINDINGS  Left Ventricle: Left ventricular ejection fraction, by estimation, is 55 to 60%. The left ventricle has normal function. The left ventricle has no regional wall motion abnormalities. The left ventricular internal cavity size was normal in size. There is  no left ventricular hypertrophy. Left ventricular diastolic parameters are consistent with Grade I diastolic dysfunction (impaired relaxation). Right Ventricle: The right ventricular size is mildly enlarged. No increase in right ventricular wall thickness. Right ventricular systolic function is normal. There is moderately elevated pulmonary artery systolic pressure. The tricuspid regurgitant velocity is 2.77 m/s, and with an assumed right atrial pressure of 15 mmHg, the estimated right ventricular systolic pressure is 81.4 mmHg. Left Atrium: Left atrial size was normal in size. Right Atrium: Right atrial size was normal in size. Pericardium: There is no evidence of pericardial effusion. Mitral Valve: The mitral valve is grossly normal. There is mild thickening of the mitral  valve leaflet(s). There is mild calcification of the mitral valve leaflet(s). Mild to moderate mitral annular calcification. Trivial mitral valve regurgitation. Tricuspid Valve: The tricuspid valve is normal in structure. Tricuspid valve regurgitation is mild. Aortic Valve: The aortic valve is tricuspid. There is mild calcification of the aortic valve. There is mild thickening of the aortic valve. Aortic valve regurgitation is trivial. Aortic valve sclerosis/calcification is present, without any evidence of aortic stenosis. Pulmonic Valve: The pulmonic valve was  normal in structure. Pulmonic valve regurgitation is trivial. Aorta: Aortic dilatation noted. There is mild dilatation of the ascending aorta, measuring 37 mm. Venous: The inferior vena cava is dilated in size with less than 50% respiratory variability, suggesting right atrial pressure of 15 mmHg. IAS/Shunts: The atrial septum is grossly normal.  LEFT VENTRICLE PLAX 2D LVIDd:         5.60 cm      Diastology LVIDs:         3.50 cm      LV e' medial:    8.49 cm/s LV PW:         1.00 cm      LV E/e' medial:  8.5 LV IVS:        0.90 cm      LV e' lateral:   8.98 cm/s LVOT diam:     2.10 cm      LV E/e' lateral: 8.0 LV SV:         70 LV SV Index:   31 LVOT Area:     3.46 cm  LV Volumes (MOD) LV vol d, MOD A2C: 158.0 ml LV vol d, MOD A4C: 144.0 ml LV vol s, MOD A2C: 66.7 ml LV vol s, MOD A4C: 69.3 ml LV SV MOD A2C:     91.3 ml LV SV MOD A4C:     144.0 ml LV SV MOD BP:      84.2 ml RIGHT VENTRICLE             IVC RV S prime:     13.20 cm/s  IVC diam: 2.30 cm RVOT diam:      2.30 cm TAPSE (M-mode): 2.0 cm LEFT ATRIUM           Index        RIGHT ATRIUM           Index LA diam:      4.20 cm 1.85 cm/m   RA Area:     18.80 cm LA Vol (A2C): 18.1 ml 7.95 ml/m   RA Volume:   44.00 ml  19.33 ml/m LA Vol (A4C): 61.8 ml 27.15 ml/m  AORTIC VALVE             PULMONIC VALVE LVOT Vmax:   100.00 cm/s PR End Diast Vel: 2.13 msec LVOT Vmean:  66.000 cm/s LVOT VTI:    0.201 m   AORTA Ao Root diam: 3.40 cm Ao Asc diam:  3.70 cm MITRAL VALVE               TRICUSPID VALVE MV Area (PHT): 4.12 cm    TR Peak grad:   30.7 mmHg MV Decel Time: 184 msec    TR Vmax:        277.00 cm/s MV E velocity: 71.82 cm/s MV A velocity: 74.95 cm/s  SHUNTS MV E/A ratio:  0.96        Systemic VTI:  0.20 m                            Systemic Diam: 2.10 cm                            Pulmonic Diam: 2.30 cm Gwyndolyn Kaufman MD Electronically signed by Gwyndolyn Kaufman MD Signature Date/Time: 12/10/2021/5:22:38 PM    Final    CT Renal Stone Study  Result Date: 12/08/2021 CLINICAL DATA:  Hematuria EXAM:  CT ABDOMEN AND PELVIS WITHOUT CONTRAST TECHNIQUE: Multidetector CT imaging of the abdomen and pelvis was performed following the standard protocol without IV contrast. RADIATION DOSE REDUCTION: This exam was performed according to the departmental dose-optimization program which includes automated exposure control, adjustment of the mA and/or kV according to patient size and/or use of iterative reconstruction technique. COMPARISON:  None. FINDINGS: Lower chest: Trace left pleural effusion. Hypodense blood pool relative to myocardium, suggesting anemia. Hepatobiliary: Unenhanced liver is unremarkable. Gallbladder is unremarkable. No intrahepatic or extrahepatic ductal dilatation. Pancreas: Within normal limits. Spleen: Enlarged, measuring 22 in maximal craniocaudal dimension. Adrenals/Urinary Tract: Adrenal glands are within normal limits. Kidneys are within normal limits. No renal calculi or hydronephrosis. Thick-walled bladder, although underdistended. Stomach/Bowel: Stomach is notable for mild concentric wall thickening of the gastric antrum (series 3/image 32), although poorly evaluated on CT. No evidence of bowel obstruction. Normal appendix (series 3/image 56). Scattered colonic diverticulosis, without evidence of diverticulitis. Vascular/Lymphatic: No evidence of abdominal aortic aneurysm. Abdominopelvic  lymphadenopathy, poorly evaluated on unenhanced CT, including: --2.8 cm short axis node in the porta hepatis (series 3/image 30) --2.8 cm short axis right para-aortic node (series 3/image 46) --3.2 cm short axis left external iliac node (series 3/image 63) --2.8 cm short axis right external iliac node (series 3/image 72) --2.0 cm short axis left inguinal node (series 3/image 88) --1.8 cm short axis right inguinal node (series 3/image 93) Reproductive: Prostate is notable for fiducial markers. Associated presacral soft tissue thickening likely reflects radiation changes. Other: Small volume pelvic ascites. Musculoskeletal: Degenerative changes of the visualized thoracolumbar spine. No focal osseous lesions. IMPRESSION: Abdominopelvic lymphadenopathy with splenomegaly, suggesting lymphoma. Fiducial markers along the prostate, suggesting radiation treatment for prostate cancer. Technically speaking, at least some of the lymphadenopathy could also be related to this diagnosis, although this would not account for splenomegaly. Thick-walled bladder, possibly reflecting radiation changes. Trace left pleural effusion. Electronically Signed   By: Julian Hy M.D.   On: 12/08/2021 23:58    Microbiology: Results for orders placed or performed during the hospital encounter of 12/08/21  Resp Panel by RT-PCR (Flu A&B, Covid) Nasopharyngeal Swab     Status: None   Collection Time: 12/09/21  8:07 AM   Specimen: Nasopharyngeal Swab; Nasopharyngeal(NP) swabs in vial transport medium  Result Value Ref Range Status   SARS Coronavirus 2 by RT PCR NEGATIVE NEGATIVE Final    Comment: (NOTE) SARS-CoV-2 target nucleic acids are NOT DETECTED.  The SARS-CoV-2 RNA is generally detectable in upper respiratory specimens during the acute phase of infection. The lowest concentration of SARS-CoV-2 viral copies this assay can detect is 138 copies/mL. A negative result does not preclude SARS-Cov-2 infection and should not be  used as the sole basis for treatment or other patient management decisions. A negative result may occur with  improper specimen collection/handling, submission of specimen other than nasopharyngeal swab, presence of viral mutation(s) within the areas targeted by this assay, and inadequate number of viral copies(<138 copies/mL). A negative result must be combined with clinical observations, patient history, and epidemiological information. The expected result is Negative.  Fact Sheet for Patients:  EntrepreneurPulse.com.au  Fact Sheet for Healthcare Providers:  IncredibleEmployment.be  This test is no t yet approved or cleared by the [place] FDA and  has been authorized for detection and/or diagnosis of SARS-CoV-2 by FDA under an Emergency Use Authorization (EUA). This EUA will remain  in effect (meaning this test can be used) for the duration of the COVID-19 declaration under Section  564(b)(1) of the Act, 21 U.S.C.section 360bbb-3(b)(1), unless the authorization is terminated  or revoked sooner.       Influenza A by PCR NEGATIVE NEGATIVE Final   Influenza B by PCR NEGATIVE NEGATIVE Final    Comment: (NOTE) The Xpert Xpress SARS-CoV-2/FLU/RSV plus assay is intended as an aid in the diagnosis of influenza from Nasopharyngeal swab specimens and should not be used as a sole basis for treatment. Nasal washings and aspirates are unacceptable for Xpert Xpress SARS-CoV-2/FLU/RSV testing.  Fact Sheet for Patients: EntrepreneurPulse.com.au  Fact Sheet for Healthcare Providers: IncredibleEmployment.be  This test is not yet approved or cleared by the [place] FDA and has been authorized for detection and/or diagnosis of SARS-CoV-2 by FDA under an Emergency Use Authorization (EUA). This EUA will remain in effect (meaning this test can be used) for the duration of the COVID-19 declaration under Section  564(b)(1) of the Act, 21 U.S.C. section 360bbb-3(b)(1), unless the authorization is terminated or revoked.  Performed at [facility], [place] [address], [place], [place] [address]     Labs: CBC: Recent Labs  Lab 12/08/21 2223 12/09/21 1935 12/10/21 0540 12/11/21 0551  WBC 4.3 4.6 2.7* 3.4*  NEUTROABS 2.8 2.7  --   --   HGB 9.0* 8.8* 8.0* 7.7*  HCT 28.5* 28.9* 25.8* 24.7*  MCV 93.8 95.7 95.2 95.7  PLT 36* 44* 41* 44*   Basic Metabolic Panel: Recent Labs  Lab 12/08/21 2223 12/10/21 0540 12/11/21 0551  NA 133* 135 133*  K 3.7 3.8 3.9  CL 105 105 103  CO2 21* 23 24  GLUCOSE 94 108* 95  BUN 19 25* 23  CREATININE 1.72* 1.45* 1.73*  CALCIUM 8.5* 8.0* 7.8*   Liver Function Tests: Recent Labs  Lab 12/08/21 2223  AST 51*  ALT 15  ALKPHOS 91  BILITOT 1.1  PROT 7.4  ALBUMIN 2.9*   CBG: No results for input(s): GLUCAP in the last 168 hours.  Discharge time spent: greater than 30 minutes.  Signed: Elmarie Shiley, MD [facility] 12/11/2021

## 2021-12-11 NOTE — Assessment & Plan Note (Signed)
Present with SOB, LE edema, Elevation BNP 700. Chest x ray: borderline heart zise. Crackles Lung exam.  ECHO; Normal EF, Diastolic Dysfunction grade 1, Pulmonary HTN. [facility].  Received 80 mg IV lasix in the ED>  Received 40 Mg IV lasix BID>  Urine out ut yesterday: 1.6. Resume home dose torsemide.  Check oxygen on ambulation.

## 2021-12-11 NOTE — Progress Notes (Signed)
Nutrition Brief Note                   RD working remotely.  Patient identified on the Malnutrition Screening Tool (MST) Report.  Wt Readings from Last 15 Encounters:  12/09/21 96.7 kg  10/04/18 110.2 kg    Body mass index is 25.95 kg/m. Patient meets criteria for overweight status based on current BMI. Neck incision from 2/10.  Current diet order is Heart Healthy. He ate 100% of breakfast this AM. Labs and medications reviewed.   Discharge order in from earlier today for d/c home. Will order Ensure Plus High Protein BID, each supplement provides 350 kcal and 20 grams of protein and will order 30 ml Prosource Plus BID, each supplement provides 100 kcal and 15 grams protein.   If patient unable to d/c today, full assessment to follow on 2/13.     [name], MS, RD, LDN Inpatient Clinical Dietitian RD pager # available in [place]  After hours/weekend pager # available in [facility]

## 2021-12-11 NOTE — Progress Notes (Signed)
PT Cancellation Note  Patient Details Name: Jerry Tran MRN: [id number] DOB: 1946-08-16   Cancelled Treatment:    Reason Eval/Treat Not Completed: PT screened, no needs identified, will sign off. Pt and spouse report pt has been mobilizing around room independently today, doesn't use SPC or RW at baseline, pt feels like strength and mobility are at baseline and declines PT eval. Pt reports he is going home today, has no questions for PT. Will sign off.    Talbot Grumbling PT, DPT 12/11/21, 9:46 AM

## 2021-12-12 ENCOUNTER — Telehealth: Payer: Self-pay | Admitting: Hematology and Oncology

## 2021-12-12 ENCOUNTER — Other Ambulatory Visit: Payer: Self-pay

## 2021-12-12 LAB — TYPE AND SCREEN
ABO/RH(D): B POS
Antibody Screen: NEGATIVE
Unit division: 0

## 2021-12-12 LAB — BPAM RBC
Blood Product Expiration Date: 202302232359
ISSUE DATE / TIME: 202302121231
Unit Type and Rh: 7300

## 2021-12-12 NOTE — Telephone Encounter (Signed)
Sch per 2/13 inbasket, pt aware

## 2021-12-13 ENCOUNTER — Encounter (HOSPITAL_COMMUNITY): Payer: Self-pay | Admitting: Surgery

## 2021-12-13 LAB — SURGICAL PATHOLOGY

## 2021-12-14 ENCOUNTER — Emergency Department (HOSPITAL_BASED_OUTPATIENT_CLINIC_OR_DEPARTMENT_OTHER)
Admit: 2021-12-14 | Discharge: 2021-12-14 | Disposition: A | Discharge status: Home / Self Care | Payer: No Typology Code available for payment source | Attending: Emergency Medicine | Admitting: Emergency Medicine

## 2021-12-14 ENCOUNTER — Telehealth: Payer: Self-pay | Admitting: *Deleted

## 2021-12-14 ENCOUNTER — Emergency Department (HOSPITAL_COMMUNITY): Payer: No Typology Code available for payment source

## 2021-12-14 ENCOUNTER — Emergency Department (HOSPITAL_COMMUNITY)
Admission: EM | Admit: 2021-12-14 | Discharge: 2021-12-14 | Disposition: A | Discharge status: Home / Self Care | Payer: No Typology Code available for payment source | Attending: Emergency Medicine | Admitting: Emergency Medicine

## 2021-12-14 DIAGNOSIS — M7989 Other specified soft tissue disorders: Secondary | ICD-10-CM | POA: Diagnosis not present

## 2021-12-14 DIAGNOSIS — R0602 Shortness of breath: Secondary | ICD-10-CM | POA: Insufficient documentation

## 2021-12-14 DIAGNOSIS — M79662 Pain in left lower leg: Secondary | ICD-10-CM

## 2021-12-14 DIAGNOSIS — R600 Localized edema: Secondary | ICD-10-CM | POA: Insufficient documentation

## 2021-12-14 DIAGNOSIS — R609 Edema, unspecified: Secondary | ICD-10-CM

## 2021-12-14 LAB — BASIC METABOLIC PANEL
Anion gap: 8 (ref 5–15)
BUN: 26 mg/dL — ABNORMAL HIGH (ref 8–23)
CO2: 23 mmol/L (ref 22–32)
Calcium: 8.9 mg/dL (ref 8.9–10.3)
Chloride: 103 mmol/L (ref 98–111)
Creatinine, Ser: 1.85 mg/dL — ABNORMAL HIGH (ref 0.61–1.24)
GFR, Estimated: 38 mL/min — ABNORMAL LOW (ref >60)
Glucose, Bld: 105 mg/dL — ABNORMAL HIGH (ref 70–99)
Potassium: 4 mmol/L (ref 3.5–5.1)
Sodium: 134 mmol/L — ABNORMAL LOW (ref 135–145)

## 2021-12-14 LAB — CBC WITH DIFFERENTIAL/PLATELET
Abs Immature Granulocytes: 0 10*3/uL (ref 0.00–0.07)
Basophils Absolute: 0 10*3/uL (ref 0.0–0.1)
Basophils Relative: 0 %
Eosinophils Absolute: 0 10*3/uL (ref 0.0–0.5)
Eosinophils Relative: 1 %
HCT: 29.5 % — ABNORMAL LOW (ref 39.0–52.0)
Hemoglobin: 9.4 g/dL — ABNORMAL LOW (ref 13.0–17.0)
Lymphocytes Relative: 48 %
Lymphs Abs: 2.1 10*3/uL (ref 0.7–4.0)
MCH: 30 pg (ref 26.0–34.0)
MCHC: 31.9 g/dL (ref 30.0–36.0)
MCV: 94.2 fL (ref 80.0–100.0)
Monocytes Absolute: 0.1 10*3/uL (ref 0.1–1.0)
Monocytes Relative: 3 %
Neutro Abs: 2.1 10*3/uL (ref 1.7–7.7)
Neutrophils Relative %: 48 %
Platelets: 45 10*3/uL — ABNORMAL LOW (ref 150–400)
RBC: 3.13 MIL/uL — ABNORMAL LOW (ref 4.22–5.81)
RDW: 18.6 % — ABNORMAL HIGH (ref 11.5–15.5)
Smear Review: DECREASED
WBC: 4.3 10*3/uL (ref 4.0–10.5)
nRBC: 0.5 % — ABNORMAL HIGH (ref 0.0–0.2)

## 2021-12-14 LAB — BRAIN NATRIURETIC PEPTIDE: B Natriuretic Peptide: 477.5 pg/mL — ABNORMAL HIGH (ref 0.0–100.0)

## 2021-12-14 MED ORDER — TORSEMIDE 20 MG PO TABS
20.0000 mg | ORAL_TABLET | Freq: Every day | ORAL | Status: DC
  Administered 2021-12-14: 20 mg via ORAL
  Filled 2021-12-14: qty 1

## 2021-12-14 MED ORDER — TORSEMIDE 10 MG PO TABS: 20.0000 mg | ORAL_TABLET | Freq: Every day | ORAL | 0 refills | Status: DC

## 2021-12-14 NOTE — ED Triage Notes (Signed)
Pt. Stated, I started having left leg swelling and pain 2 weeks ago. Went to Dr. And he sent me here for [place]  to rule out DVT

## 2021-12-14 NOTE — Progress Notes (Signed)
Left lower extremity venous duplex completed. Refer to "CV Proc" under chart review to view preliminary results.  12/14/2021 12:03 PM Kelby Aline., MHA, RVT, RDCS, RDMS

## 2021-12-14 NOTE — ED Provider Triage Note (Signed)
Emergency Medicine Provider Triage Evaluation Note  Jerry Tran , a 76 y.o. male  was evaluated in triage.  Pt complains of leg swelling. Report sob for the past several weeks.  Noticing increase swelling to L leg more than R leg for the past few days.  PCP wants DVT study.  No hx of CHF, no CP  Review of Systems  Positive: Sob, leg swelling Negative: Fever, chest pain, injury  Physical Exam  BP (!) 94/58 (BP Location: Right Arm)    Pulse 88    Temp 98.4 F (36.9 C) (Oral)    Resp 18    SpO2 98%  Gen:   Awake, no distress   Resp:  Normal effort  MSK:   Moves extremities without difficulty  Other:  2+ pitting to LLE, 1+ pitting to RLE  Medical Decision Making  Medically screening exam initiated at 11:04 AM.  Appropriate orders placed.  Jerry Tran was informed that the remainder of the evaluation will be completed by another provider, this initial triage assessment does not replace that evaluation, and the importance of remaining in the ED until their evaluation is complete.  Here with SOB, hx of CHF.    Jerry Moras, PA-C 12/14/21 1109

## 2021-12-14 NOTE — Telephone Encounter (Signed)
Per MD pt needing to f/u as a new pt with Dr. Irene Limbo.  Appt scheduled and RN verified appt date and time with pt spouse Blanch Media.

## 2021-12-14 NOTE — Discharge Instructions (Signed)
Your study for a blood clot in the leg was negative.  Your lab work is consistent with you may be having too much fluid in your body.  I am going to have you double your home medicine that helps you pee more.  Please call your doctor that keeps track of this for you you need to see someone within a week to have your blood work rechecked.  Please return for worsening difficulty breathing.  If your leg swelling persists then someone may want to repeat an ultrasound.

## 2021-12-14 NOTE — ED Provider Notes (Signed)
[facility] EMERGENCY DEPARTMENT Provider Note   CSN: [id number] Arrival date & time: 12/14/21  1024     History  Chief Complaint  Patient presents with   Leg Swelling   Leg Pain    Harrold Fitchett is a 76 y.o. male.  76 yo M with a chief complaints of shortness of breath and left leg swelling.  Is been going on for a few days now.  He recently was admitted to the hospital for having too much fluid on him and some concern for cancer.  He had a lymph node biopsy done while he was in the hospital.  He had called his physician at the Department of [facility] [place] who suggested he come to the emergency department there and elected to come here since he was recently admitted at this hospital system.  He denies cough congestion or fever.  Denies chest pain or pressure plan   Leg Pain     Home Medications Prior to Admission medications   Medication Sig Start Date End Date Taking? Authorizing Provider  ammonium lactate (LAC-HYDRIN) 12 % lotion Apply 1 application topically 2 (two) times daily as needed for dry skin.    [provider]  atorvastatin (LIPITOR) 20 MG tablet Take 10 mg by mouth at bedtime.    [provider]  Cholecalciferol (VITAMIN D3 PO) Take 1 tablet by mouth every morning.    [provider]  diclofenac Sodium (VOLTAREN) 1 % GEL Apply 2 g topically 4 (four) times daily as needed (pain).    [provider]  feeding supplement (ENSURE ENLIVE / ENSURE PLUS) LIQD Take 237 mLs by mouth 2 (two) times daily between meals. 12/11/21   Regalado, Belkys A, MD  flecainide (TAMBOCOR) 100 MG tablet Take 100 mg by mouth every 12 (twelve) hours.    [provider]  fluticasone (FLONASE) 50 MCG/ACT nasal spray Place 1 spray into both nostrils daily as needed for allergies or rhinitis.    [provider]  hydroxypropyl methylcellulose / hypromellose (ISOPTO TEARS / GONIOVISC) 2.5 % ophthalmic solution Place 2 drops into  both eyes 2 (two) times daily as needed for dry eyes.    [provider]  methocarbamol (ROBAXIN) 500 MG tablet Take 500 mg by mouth every 8 (eight) hours as needed for muscle spasms.    [provider]  metoprolol tartrate (LOPRESSOR) 25 MG tablet Take 12.5 mg by mouth 2 (two) times daily. Take with flecainide    [provider]  omeprazole (PRILOSEC) 20 MG capsule Take 20 mg by mouth daily before breakfast.    [provider]  prazosin (MINIPRESS) 2 MG capsule Take 6 mg by mouth at bedtime.    [provider]  terbinafine (LAMISIL) 1 % cream Apply 1 application topically 2 (two) times daily as needed (athlete's foot).    [provider]  torsemide (DEMADEX) 10 MG tablet Take 2 tablets (20 mg total) by mouth daily for 5 doses. Take one tablet (10 mg) by mouth every morning, take an extra tablet (10 mg) for weight gain of 5 lbs or more 12/14/21 12/19/21  Deno Etienne, DO  triamcinolone cream (KENALOG) 0.1 % Apply 1 application topically 2 (two) times daily as needed (rash).    [provider]      Allergies    Sildenafil    Review of Systems   Review of Systems  Physical Exam Updated Vital Signs BP 119/64    Pulse 77  Temp 98 F (36.7 C) (Oral)    Resp 17    SpO2 100%  Physical Exam Vitals and nursing note reviewed.  Constitutional:      Appearance: He is well-developed.  HENT:     Head: Normocephalic and atraumatic.  Eyes:     Pupils: Pupils are equal, round, and reactive to light.  Neck:     Vascular: No JVD.  Cardiovascular:     Rate and Rhythm: Normal rate and regular rhythm.     Heart sounds: No murmur heard.   No friction rub. No gallop.  Pulmonary:     Effort: No respiratory distress.     Breath sounds: No wheezing.  Abdominal:     General: There is no distension.     Tenderness: There is no abdominal tenderness. There is no guarding or rebound.  Musculoskeletal:        General: Swelling present. Normal range  of motion.     Cervical back: Normal range of motion and neck supple.     Right lower leg: Edema present.     Left lower leg: Edema present.     Comments: Edema to bilateral lower extremities left greater than right.  Pitting up to the left thigh.  Skin:    Coloration: Skin is not pale.     Findings: No rash.  Neurological:     Mental Status: He is alert and oriented to person, place, and time.  Psychiatric:        Behavior: Behavior normal.    ED Results / Procedures / Treatments   Labs (all labs ordered are listed, but only abnormal results are displayed) Labs Reviewed  BASIC METABOLIC PANEL - Abnormal; Notable for the following components:      Result Value   Sodium 134 (*)    Glucose, Bld 105 (*)    BUN 26 (*)    Creatinine, Ser 1.85 (*)    GFR, Estimated 38 (*)    All other components within normal limits  CBC WITH DIFFERENTIAL/PLATELET - Abnormal; Notable for the following components:   RBC 3.13 (*)    Hemoglobin 9.4 (*)    HCT 29.5 (*)    RDW 18.6 (*)    Platelets 45 (*)    nRBC 0.5 (*)    All other components within normal limits  BRAIN NATRIURETIC PEPTIDE - Abnormal; Notable for the following components:   B Natriuretic Peptide 477.5 (*)    All other components within normal limits  PATHOLOGIST SMEAR REVIEW    EKG EKG Interpretation  Date/Time:  Wednesday December 14 2021 11:19:21 EST Ventricular Rate:  79 PR Interval:  198 QRS Duration: 82 QT Interval:  364 QTC Calculation: 417 R Axis:   86 Text Interpretation: Sinus rhythm with sinus arrhythmia with frequent Premature ventricular complexes Anterior infarct , age undetermined Abnormal ECG When compared with ECG of 09-Dec-2021 03:48, PREVIOUS ECG IS PRESENT No significant change since last tracing Confirmed by Deno Etienne [phone]) on 12/14/2021 4:34:36 PM  Radiology DG Chest 2 View  Result Date: 12/14/2021 CLINICAL DATA:  Short of breath. EXAM: CHEST - 2 VIEW COMPARISON:  Chest x-ray 12/08/2021.  Chest CT  12/09/2021 FINDINGS: Linear horizontal densities in the left lung base and lingula appear unchanged. No new infiltrate or effusion. Negative for heart failure or edema. IMPRESSION: Persistent linear densities in the lingula and left lung base likely due to scarring or atelectasis. Electronically Signed   By: Franchot Gallo M.D.   On: 12/14/2021 11:52  VAS [place] LOWER EXTREMITY VENOUS (DVT) (7a-7p)  Result Date: 12/14/2021  Lower Venous DVT Study Patient Name:  GARRUS GAUTHREAUX  Date of Exam:   12/14/2021 Medical Rec #: [id number]     Accession #:    [id number] Date of Birth: 07-10-1946     Patient Gender: M Patient Age:   37 years Exam Location:  [facility] Procedure:      VAS [place] LOWER EXTREMITY VENOUS (DVT) Referring Phys: Domenic Moras --------------------------------------------------------------------------------  Indications: Left lower extremity pitting edema and pain x "months" per patient.  Limitations: Poor ultrasound/tissue interface. Comparison Study: No prior study Performing Technologist: Maudry Mayhew MHA, RDMS, RVT, RDCS  Examination Guidelines: A complete evaluation includes B-mode imaging, spectral Doppler, color Doppler, and power Doppler as needed of all accessible portions of each vessel. Bilateral testing is considered an integral part of a complete examination. Limited examinations for reoccurring indications may be performed as noted. The reflux portion of the exam is performed with the patient in reverse Trendelenburg.  +-----+---------------+---------+-----------+----------+--------------+  RIGHT Compressibility Phasicity Spontaneity Properties Thrombus Aging  +-----+---------------+---------+-----------+----------+--------------+  CFV   Full            Yes       Yes                                    +-----+---------------+---------+-----------+----------+--------------+   +---------+---------------+---------+-----------+----------+--------------+  LEFT       Compressibility Phasicity Spontaneity Properties Thrombus Aging  +---------+---------------+---------+-----------+----------+--------------+  CFV       Full            Yes       Yes                                    +---------+---------------+---------+-----------+----------+--------------+  SFJ       Full                                                             +---------+---------------+---------+-----------+----------+--------------+  FV Prox   Full                                                             +---------+---------------+---------+-----------+----------+--------------+  FV Mid    Full                                                             +---------+---------------+---------+-----------+----------+--------------+  FV Distal Full                                                             +---------+---------------+---------+-----------+----------+--------------+  PFV  Full                                                             +---------+---------------+---------+-----------+----------+--------------+  POP       Full            Yes       Yes                                    +---------+---------------+---------+-----------+----------+--------------+  PTV       Full                                                             +---------+---------------+---------+-----------+----------+--------------+  PERO      Full                                                             +---------+---------------+---------+-----------+----------+--------------+     Summary: RIGHT: - No evidence of common femoral vein obstruction.  LEFT: - There is no evidence of deep vein thrombosis in the lower extremity.  - No cystic structure found in the popliteal fossa. - Ultrasound characteristics of enlarged lymph nodes noted in the groin.  *See table(s) above for measurements and observations.    Preliminary     Procedures Procedures    Medications Ordered in ED Medications  torsemide  (DEMADEX) tablet 20 mg (20 mg Oral Given 12/14/21 1705)    ED Course/ Medical Decision Making/ A&P                           Medical Decision Making Risk Prescription drug management.   Patient is a 76 y.o. male with a cc of shortness of breath and left leg swelling.  This is been going on for about 3 to 4 days now.  He was just in the hospital was found to have lymphadenopathy and pancytopenia there was concern for possible cancer and so a lymph node biopsy was obtained.  He was fluid overloaded at that time and was started on torsemide.  The patient felt like his breathing had gotten a bit worse over the past few days.  Had called his physician at the [place] who suggested he come to be evaluated.  DVT study here is negative for DVT.  Clinically does not seem to fit with cellulitis.  Chest x-ray viewed by me and independently interpreted without focal infiltrate or pneumothorax.  His hemoglobin is higher than it was last checked when he was hospitalized few days ago.  His BNP is also elevated but down from preadmission.  His renal function is mildly worse than his discharge.  No hyperkalemia.  I suspect he is likely recurrently fluid overloaded.  We will have him double his torsemide at home for the next few days.  Have him call  his cardiologist.  We will have him return for worsening.  5:14 PM:  I have discussed the diagnosis/risks/treatment options with the patient and family.  Evaluation and diagnostic testing in the emergency department does not suggest an emergent condition requiring admission or immediate intervention beyond what has been performed at this time.  They will follow up with  PCP, cards. We also discussed returning to the ED immediately if new or worsening sx occur. We discussed the sx which are most concerning (e.g., sudden worsening pain, fever, inability to tolerate by mouth) that necessitate immediate return. Medications administered to the patient during their visit and any new  prescriptions provided to the patient are listed below.  Medications given during this visit Medications  torsemide (DEMADEX) tablet 20 mg (20 mg Oral Given 12/14/21 1705)     The patient appears reasonably screen and/or stabilized for discharge and I doubt any other medical condition or other [facility] requiring further screening, evaluation, or treatment in the ED at this time prior to discharge.          Final Clinical Impression(s) / ED Diagnoses Final diagnoses:  Peripheral edema    Rx / DC Orders ED Discharge Orders          Ordered    torsemide (DEMADEX) 10 MG tablet  Daily        12/14/21 1706              Deno Etienne, DO 12/14/21 1714

## 2021-12-15 ENCOUNTER — Other Ambulatory Visit: Payer: Self-pay

## 2021-12-15 LAB — PATHOLOGIST SMEAR REVIEW

## 2021-12-16 ENCOUNTER — Inpatient Hospital Stay: Payer: Medicare Other

## 2021-12-16 ENCOUNTER — Inpatient Hospital Stay: Payer: Medicare Other | Attending: Hematology and Oncology | Admitting: Hematology

## 2021-12-16 ENCOUNTER — Other Ambulatory Visit: Payer: Self-pay

## 2021-12-16 ENCOUNTER — Other Ambulatory Visit: Payer: Self-pay | Admitting: Hematology

## 2021-12-16 VITALS — BP 106/74 | HR 83 | Temp 97.7°F | Resp 18 | Ht 76.0 in | Wt 208.8 lb

## 2021-12-16 DIAGNOSIS — D630 Anemia in neoplastic disease: Secondary | ICD-10-CM | POA: Diagnosis not present

## 2021-12-16 DIAGNOSIS — C8318 Mantle cell lymphoma, lymph nodes of multiple sites: Secondary | ICD-10-CM | POA: Insufficient documentation

## 2021-12-16 DIAGNOSIS — D6959 Other secondary thrombocytopenia: Secondary | ICD-10-CM | POA: Diagnosis not present

## 2021-12-16 DIAGNOSIS — R161 Splenomegaly, not elsewhere classified: Secondary | ICD-10-CM | POA: Diagnosis not present

## 2021-12-16 DIAGNOSIS — Z7189 Other specified counseling: Secondary | ICD-10-CM | POA: Insufficient documentation

## 2021-12-16 DIAGNOSIS — R634 Abnormal weight loss: Secondary | ICD-10-CM | POA: Diagnosis not present

## 2021-12-16 DIAGNOSIS — Z87891 Personal history of nicotine dependence: Secondary | ICD-10-CM | POA: Insufficient documentation

## 2021-12-16 DIAGNOSIS — E43 Unspecified severe protein-calorie malnutrition: Secondary | ICD-10-CM | POA: Insufficient documentation

## 2021-12-16 LAB — CBC WITH DIFFERENTIAL/PLATELET
Abs Immature Granulocytes: 0.03 10*3/uL (ref 0.00–0.07)
Basophils Absolute: 0 10*3/uL (ref 0.0–0.1)
Basophils Relative: 1 %
Eosinophils Absolute: 0 10*3/uL (ref 0.0–0.5)
Eosinophils Relative: 1 %
HCT: 29.3 % — ABNORMAL LOW (ref 39.0–52.0)
Hemoglobin: 9.4 g/dL — ABNORMAL LOW (ref 13.0–17.0)
Immature Granulocytes: 1 %
Lymphocytes Relative: 54 %
Lymphs Abs: 1.9 10*3/uL (ref 0.7–4.0)
MCH: 29.5 pg (ref 26.0–34.0)
MCHC: 32.1 g/dL (ref 30.0–36.0)
MCV: 91.8 fL (ref 80.0–100.0)
Monocytes Absolute: 0.2 10*3/uL (ref 0.1–1.0)
Monocytes Relative: 6 %
Neutro Abs: 1.4 10*3/uL — ABNORMAL LOW (ref 1.7–7.7)
Neutrophils Relative %: 37 %
Platelets: 50 10*3/uL — ABNORMAL LOW (ref 150–400)
RBC: 3.19 MIL/uL — ABNORMAL LOW (ref 4.22–5.81)
RDW: 18 % — ABNORMAL HIGH (ref 11.5–15.5)
Smear Review: NORMAL
WBC: 3.6 10*3/uL — ABNORMAL LOW (ref 4.0–10.5)
nRBC: 0.6 % — ABNORMAL HIGH (ref 0.0–0.2)

## 2021-12-16 LAB — CMP (CANCER CENTER ONLY)
ALT: 11 U/L (ref 0–44)
AST: 38 U/L (ref 15–41)
Albumin: 3.3 g/dL — ABNORMAL LOW (ref 3.5–5.0)
Alkaline Phosphatase: 82 U/L (ref 38–126)
Anion gap: 5 (ref 5–15)
BUN: 33 mg/dL — ABNORMAL HIGH (ref 8–23)
CO2: 29 mmol/L (ref 22–32)
Calcium: 9.1 mg/dL (ref 8.9–10.3)
Chloride: 99 mmol/L (ref 98–111)
Creatinine: 2.12 mg/dL — ABNORMAL HIGH (ref 0.61–1.24)
GFR, Estimated: 32 mL/min — ABNORMAL LOW (ref >60)
Glucose, Bld: 99 mg/dL (ref 70–99)
Potassium: 4.3 mmol/L (ref 3.5–5.1)
Sodium: 133 mmol/L — ABNORMAL LOW (ref 135–145)
Total Bilirubin: 0.9 mg/dL (ref 0.3–1.2)
Total Protein: 8.1 g/dL (ref 6.5–8.1)

## 2021-12-16 LAB — HEPATITIS B CORE ANTIBODY, TOTAL: Hep B Core Total Ab: REACTIVE — AB

## 2021-12-16 LAB — HEPATITIS B SURFACE ANTIGEN: Hepatitis B Surface Ag: NONREACTIVE

## 2021-12-16 LAB — HIV ANTIBODY (ROUTINE TESTING W REFLEX): HIV Screen 4th Generation wRfx: NONREACTIVE

## 2021-12-16 LAB — HEPATITIS C ANTIBODY: HCV Ab: NONREACTIVE

## 2021-12-16 LAB — LACTATE DEHYDROGENASE: LDH: 457 U/L — ABNORMAL HIGH (ref 98–192)

## 2021-12-16 LAB — TYPE AND SCREEN
ABO/RH(D): B POS
Antibody Screen: NEGATIVE

## 2021-12-16 LAB — URIC ACID: Uric Acid, Serum: 17.4 mg/dL — ABNORMAL HIGH (ref 3.7–8.6)

## 2021-12-16 MED ORDER — PREDNISONE 20 MG PO TABS: ORAL_TABLET | ORAL | 0 refills | Status: DC

## 2021-12-16 MED ORDER — ONDANSETRON HCL 8 MG PO TABS: 8.0000 mg | ORAL_TABLET | Freq: Two times a day (BID) | ORAL | 1 refills | Status: DC | PRN

## 2021-12-16 MED ORDER — LORAZEPAM 0.5 MG PO TABS: 0.5000 mg | ORAL_TABLET | Freq: Four times a day (QID) | ORAL | 0 refills | Status: DC | PRN

## 2021-12-16 MED ORDER — DEXAMETHASONE 4 MG PO TABS: 8.0000 mg | ORAL_TABLET | Freq: Every day | ORAL | 1 refills | Status: DC

## 2021-12-16 MED ORDER — ALLOPURINOL 100 MG PO TABS: 100.0000 mg | ORAL_TABLET | Freq: Two times a day (BID) | ORAL | 0 refills | Status: DC

## 2021-12-16 MED ORDER — ACYCLOVIR 400 MG PO TABS: 400.0000 mg | ORAL_TABLET | Freq: Every day | ORAL | 3 refills | Status: DC

## 2021-12-16 MED ORDER — PROCHLORPERAZINE MALEATE 10 MG PO TABS: 10.0000 mg | ORAL_TABLET | Freq: Four times a day (QID) | ORAL | 1 refills | Status: DC | PRN

## 2021-12-16 NOTE — Progress Notes (Signed)
HEMATOLOGY/ONCOLOGY CONSULTATION NOTE  Date of Service: 12/16/2021  Patient Care Team: [facility] [place] as PCP - General (General Practice)  CHIEF COMPLAINTS/PURPOSE OF CONSULTATION:  Newly diagnosed mantle cell lymphoma  HISTORY OF PRESENTING ILLNESS:   Jerry Tran is a wonderful 76 y.o. male who has been referred to [place] by Dr Nicholas Lose for evaluation and management of newly diagnosed mantle cell lymphoma.  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  He notes that he has had a significant loss of appetite and weight loss of about 20 to 30 pounds over the last 1 to 2 months. He recently was treated for Helicobacter pylori gastritis.  Patient had a CT renal stone protocol on 12/08/2021 for evaluation of his hematuria which showed abdominal pelvic lymphadenopathy with significant splenomegaly of 22 cm.  Thick walled bladder reflecting radiation changes.  Patient had a CTA of the chest which showed axillary mediastinal lymphadenopathy with marked enlargement of the spleen.  Negative for pulmonary embolism  Labs on presentation to the emergency room on 12/08/2021 showed anemia with a hemoglobin of 9, WBC count of 4.3k and platelets of 36k.  Patient did have a biopsy of his right supraclavicular lymph node on 12/09/2021 which later resulted and showed findings consistent with newly diagnosed mantle cell lymphoma.  Dr. Lindi Adie call me with these results and asked me to take over this patient for continued evaluation and management of his newly diagnosed mantle cell lymphoma.  In clinic today the patient notes significant fatigue.  No acute chest pain or shortness of  breath.  No acute abdominal discomfort.  Poor p.o. intake.  All lab results imaging studies and pathology results were discussed in detail with the patient.  He was accompanied by his wife for this clinic visit.  Patient is a retired [place] war veteran who does endorse agent orange exposure.  MEDICAL HISTORY:  Past Medical History:  Diagnosis Date   Arthritis    Chronic kidney disease, stage 3b ([place]) 12/09/2021   Dyslipidemia    GERD (gastroesophageal reflux disease)    Hypertension    PAF (paroxysmal atrial fibrillation) (HCC)    Prostate cancer ([place]) 03/2021   s/p radiation therapy   PTSD (post-traumatic stress disorder)     SURGICAL HISTORY: Past Surgical History:  Procedure Laterality Date   LIPOMA EXCISION Right 12/09/2021   Procedure: RIGHT SUPERCLAVICULAR LYMPH NODE EXCISION;  Surgeon: Ileana Roup, MD;  Location: [facility];  Service: General;  Laterality: Right;    SOCIAL HISTORY: Social History   Socioeconomic History   Marital status: Widowed    Spouse name: Not on file   Number of children: Not on file   Years of education: Not on file   Highest education level: Not on file  Occupational History   Occupation: retired  Tobacco Use   Smoking status: Former    Packs/day: 1.00    Years: 15.00    Pack years: 15.00    Types: Cigarettes   Smokeless tobacco: Never  Substance and Sexual Activity   Alcohol use: Not Currently    Comment: h/o heavy use   Drug use: Not Currently    Types: Marijuana, Cocaine    Comment: remote   Sexual activity:  Not on file  Other Topics Concern   Not on file  Social History Narrative   Not on file   Social Determinants of Health   Financial Resource Strain: Not on file  Food Insecurity: Not on file  Transportation Needs: Not on file  Physical Activity: Not on file  Stress: Not on file  Social Connections: Not on file  Intimate Partner Violence: Not on file    FAMILY HISTORY: Family History  Problem Relation Age  of Onset   Cancer Neg Hx     ALLERGIES:  is allergic to sildenafil.  MEDICATIONS:  Current Outpatient Medications  Medication Sig Dispense Refill   ammonium lactate (LAC-HYDRIN) 12 % lotion Apply 1 application topically 2 (two) times daily as needed for dry skin.     atorvastatin (LIPITOR) 20 MG tablet Take 10 mg by mouth at bedtime.     Cholecalciferol (VITAMIN D3 PO) Take 1 tablet by mouth every morning.     diclofenac Sodium (VOLTAREN) 1 % GEL Apply 2 g topically 4 (four) times daily as needed (pain).     feeding supplement (ENSURE ENLIVE / ENSURE PLUS) LIQD Take 237 mLs by mouth 2 (two) times daily between meals. 237 mL 12   flecainide (TAMBOCOR) 100 MG tablet Take 100 mg by mouth every 12 (twelve) hours.     fluticasone (FLONASE) 50 MCG/ACT nasal spray Place 1 spray into both nostrils daily as needed for allergies or rhinitis.     hydroxypropyl methylcellulose / hypromellose (ISOPTO TEARS / GONIOVISC) 2.5 % ophthalmic solution Place 2 drops into both eyes 2 (two) times daily as needed for dry eyes.     methocarbamol (ROBAXIN) 500 MG tablet Take 500 mg by mouth every 8 (eight) hours as needed for muscle spasms.     metoprolol tartrate (LOPRESSOR) 25 MG tablet Take 12.5 mg by mouth 2 (two) times daily. Take with flecainide     omeprazole (PRILOSEC) 20 MG capsule Take 20 mg by mouth daily before breakfast.     prazosin (MINIPRESS) 2 MG capsule Take 6 mg by mouth at bedtime.     terbinafine (LAMISIL) 1 % cream Apply 1 application topically 2 (two) times daily as needed (athlete's foot).     torsemide (DEMADEX) 10 MG tablet Take 2 tablets (20 mg total) by mouth daily for 5 doses. Take one tablet (10 mg) by mouth every morning, take an extra tablet (10 mg) for weight gain of 5 lbs or more 10 tablet 0   triamcinolone cream (KENALOG) 0.1 % Apply 1 application topically 2 (two) times daily as needed (rash).     No current facility-administered medications for this visit.    REVIEW OF SYSTEMS:     10 Point review of Systems was done is negative except as noted above.  PHYSICAL EXAMINATION: ECOG PERFORMANCE STATUS: 2 - Symptomatic, <50% confined to bed  . Vitals:   12/16/21 0849  BP: 106/74  Pulse: 83  Resp: 18  Temp: 97.7 F (36.5 C)  SpO2: 100%   Filed Weights   12/16/21 0849  Weight: 208 lb 12.8 oz (94.7 kg)   .Body mass index is 25.42 kg/m.  GENERAL:alert, in no acute distress and comfortable SKIN: no acute rashes, no significant lesions EYES: conjunctiva are pink and non-injected, sclera anicteric OROPHARYNX: MMM, no exudates, no oropharyngeal erythema or ulceration NECK: supple, no JVD LYMPH: Palpable lymphadenopathy in the cervical, supraclavicular, axillary and inguinal areas LUNGS: clear to auscultation b/l with normal respiratory effort HEART: regular rate &  rhythm ABDOMEN:  normoactive bowel sounds , non tender, mildly distended significant palpable splenomegaly at least 4 to 5 fingerbreadths below left costal margin Extremity: Bilateral trace pedal edema PSYCH: alert & oriented x 3 with fluent speech NEURO: no focal motor/sensory deficits  LABORATORY DATA:  I have reviewed the data as listed  . CBC Latest Ref Rng & Units 12/16/2021 12/14/2021 12/11/2021  WBC 4.0 - 10.5 K/uL 3.6(L) 4.3 3.4(L)  Hemoglobin 13.0 - 17.0 g/dL 9.4(L) 9.4(L) 7.7(L)  Hematocrit 39.0 - 52.0 % 29.3(L) 29.5(L) 24.7(L)  Platelets 150 - 400 K/uL 50(L) 45(L) 44(L)    . CMP Latest Ref Rng & Units 12/16/2021 12/14/2021 12/11/2021  Glucose 70 - 99 mg/dL 99 105(H) 95  BUN 8 - 23 mg/dL 33(H) 26(H) 23  Creatinine 0.61 - 1.24 mg/dL 2.12(H) 1.85(H) 1.73(H)  Sodium 135 - 145 mmol/L 133(L) 134(L) 133(L)  Potassium 3.5 - 5.1 mmol/L 4.3 4.0 3.9  Chloride 98 - 111 mmol/L 99 103 103  CO2 22 - 32 mmol/L '29 23 24  ' Calcium 8.9 - 10.3 mg/dL 9.1 8.9 7.8(L)  Total Protein 6.5 - 8.1 g/dL 8.1 - -  Total Bilirubin 0.3 - 1.2 mg/dL 0.9 - -  Alkaline Phos 38 - 126 U/L 82 - -  AST 15 - 41 U/L 38 - -   ALT 0 - 44 U/L 11 - -     RADIOGRAPHIC STUDIES: I have personally reviewed the radiological images as listed and agreed with the findings in the report. DG Chest 2 View  Result Date: 12/14/2021 CLINICAL DATA:  Short of breath. EXAM: CHEST - 2 VIEW COMPARISON:  Chest x-ray 12/08/2021.  Chest CT 12/09/2021 FINDINGS: Linear horizontal densities in the left lung base and lingula appear unchanged. No new infiltrate or effusion. Negative for heart failure or edema. IMPRESSION: Persistent linear densities in the lingula and left lung base likely due to scarring or atelectasis. Electronically Signed   By: Franchot Gallo M.D.   On: 12/14/2021 11:52   DG Chest 2 View  Result Date: 12/08/2021 CLINICAL DATA:  Shortness of breath EXAM: CHEST - 2 VIEW COMPARISON:  None. FINDINGS: Borderline heart size with normal pulmonary vascularity. Linear opacities in the mid and lower lungs likely representing fibrosis or linear atelectasis. No focal consolidation. No pleural effusions. No pneumothorax. Mediastinal contours appear intact. Degenerative changes in the spine. IMPRESSION: Linear atelectasis or fibrosis in the mid and lower lungs. Borderline heart size. No evidence of active pulmonary disease. Electronically Signed   By: Lucienne Capers M.D.   On: 12/08/2021 22:58   CT Angio Chest PE W and/or Wo Contrast  Result Date: 12/09/2021 CLINICAL DATA:  High probability for pulmonary embolism EXAM: CT ANGIOGRAPHY CHEST WITH CONTRAST TECHNIQUE: Multidetector CT imaging of the chest was performed using the standard protocol during bolus administration of intravenous contrast. Multiplanar CT image reconstructions and MIPs were obtained to evaluate the vascular anatomy. RADIATION DOSE REDUCTION: This exam was performed according to the departmental dose-optimization program which includes automated exposure control, adjustment of the mA and/or kV according to patient size and/or use of iterative reconstruction technique.  CONTRAST:  13m OMNIPAQUE IOHEXOL 350 MG/ML SOLN COMPARISON:  None. FINDINGS: Cardiovascular: Suboptimal but satisfactory opacification of the pulmonary arteries to the segmental level when accounting for intermittent motion and streak artifact. No evidence of pulmonary embolism. Enlarged heart size. No pericardial effusion. Mediastinum/Nodes: Bilateral axillary lymphadenopathy with homogeneous nodal density. Nodes measure up to 2.2 cm bilaterally. There is also intrathoracic adenopathy scattered throughout the  mediastinum and affecting bilateral hila. Left anterior mediastinal or IMA chain node measures up to 2.2 cm long axis. Partial coverage of submental adenopathy. Lungs/Pleura: Patchy opacity in the dependent lungs likely reflecting atelectasis. No focal pneumonia or evidence of edema. No effusion or air leak. There is right lower lobe paramediastinal bandlike opacity which is likely scarring and sided by vertebral osteophytes Upper Abdomen: Partial coverage of the spleen shows marked enlargement, measuring at least 18 x 11 cm. Musculoskeletal: Thoracic spondylosis and degeneration which is generalized. No acute or destructive process. Review of the MIP images confirms the above findings. IMPRESSION: 1. Axillary and mediastinal adenopathy with marked enlargement of the partially covered spleen, lymphoma is the leading concern. 2. Negative for pulmonary embolism. Electronically Signed   By: Jorje Guild M.D.   On: 12/09/2021 05:33   ECHOCARDIOGRAM COMPLETE  Result Date: 12/10/2021    ECHOCARDIOGRAM REPORT   Patient Name:   OPIE MACLAUGHLIN Date of Exam: 12/10/2021 Medical Rec #:  [id number]    Height:       76.0 in Accession #:    [id number]   Weight:       213.2 lb Date of Birth:  April 24, 1946    BSA:          2.276 m Patient Age:    58 years     BP:           109/71 mmHg Patient Gender: M            HR:           71 bpm. Exam Location:  Inpatient Procedure: 2D Echo, 3D Echo, Cardiac Doppler and Color Doppler  Indications:    R06.02 SOB  History:        Patient has no prior history of Echocardiogram examinations.                 Abnormal ECG, Arrythmias:Atrial Fibrillation,                 Signs/Symptoms:Dyspnea; Risk Factors:Hypertension, Dyslipidemia                 and Former Smoker. Cancer.  Sonographer:    Roseanna Rainbow RDCS Referring Phys: [id number] BELKYS A REGALADO IMPRESSIONS  1. Left ventricular ejection fraction, by estimation, is 55 to 60%. The left ventricle has normal function. The left ventricle has no regional wall motion abnormalities. Left ventricular diastolic parameters are consistent with Grade I diastolic dysfunction (impaired relaxation).  2. Right ventricular systolic function is normal. The right ventricular size is mildly enlarged. There is moderately elevated pulmonary artery systolic pressure. The estimated right ventricular systolic pressure is 35.4 mmHg.  3. The mitral valve is grossly normal. Trivial mitral valve regurgitation.  4. The aortic valve is tricuspid. There is mild calcification of the aortic valve. There is mild thickening of the aortic valve. Aortic valve regurgitation is trivial. Aortic valve sclerosis/calcification is present, without any evidence of aortic stenosis.  5. Aortic dilatation noted. There is mild dilatation of the ascending aorta, measuring 37 mm.  6. The inferior vena cava is dilated in size with <50% respiratory variability, suggesting right atrial pressure of 15 mmHg.  7. Frequent PVCs during study Comparison(s): No prior Echocardiogram. FINDINGS  Left Ventricle: Left ventricular ejection fraction, by estimation, is 55 to 60%. The left ventricle has normal function. The left ventricle has no regional wall motion abnormalities. The left ventricular internal cavity size was normal in size. There is  no left ventricular hypertrophy. Left  ventricular diastolic parameters are consistent with Grade I diastolic dysfunction (impaired relaxation). Right Ventricle: The right  ventricular size is mildly enlarged. No increase in right ventricular wall thickness. Right ventricular systolic function is normal. There is moderately elevated pulmonary artery systolic pressure. The tricuspid regurgitant velocity is 2.77 m/s, and with an assumed right atrial pressure of 15 mmHg, the estimated right ventricular systolic pressure is 48.1 mmHg. Left Atrium: Left atrial size was normal in size. Right Atrium: Right atrial size was normal in size. Pericardium: There is no evidence of pericardial effusion. Mitral Valve: The mitral valve is grossly normal. There is mild thickening of the mitral valve leaflet(s). There is mild calcification of the mitral valve leaflet(s). Mild to moderate mitral annular calcification. Trivial mitral valve regurgitation. Tricuspid Valve: The tricuspid valve is normal in structure. Tricuspid valve regurgitation is mild. Aortic Valve: The aortic valve is tricuspid. There is mild calcification of the aortic valve. There is mild thickening of the aortic valve. Aortic valve regurgitation is trivial. Aortic valve sclerosis/calcification is present, without any evidence of aortic stenosis. Pulmonic Valve: The pulmonic valve was normal in structure. Pulmonic valve regurgitation is trivial. Aorta: Aortic dilatation noted. There is mild dilatation of the ascending aorta, measuring 37 mm. Venous: The inferior vena cava is dilated in size with less than 50% respiratory variability, suggesting right atrial pressure of 15 mmHg. IAS/Shunts: The atrial septum is grossly normal.  LEFT VENTRICLE PLAX 2D LVIDd:         5.60 cm      Diastology LVIDs:         3.50 cm      LV e' medial:    8.49 cm/s LV PW:         1.00 cm      LV E/e' medial:  8.5 LV IVS:        0.90 cm      LV e' lateral:   8.98 cm/s LVOT diam:     2.10 cm      LV E/e' lateral: 8.0 LV SV:         70 LV SV Index:   31 LVOT Area:     3.46 cm  LV Volumes (MOD) LV vol d, MOD A2C: 158.0 ml LV vol d, MOD A4C: 144.0 ml LV vol s, MOD  A2C: 66.7 ml LV vol s, MOD A4C: 69.3 ml LV SV MOD A2C:     91.3 ml LV SV MOD A4C:     144.0 ml LV SV MOD BP:      84.2 ml RIGHT VENTRICLE             IVC RV S prime:     13.20 cm/s  IVC diam: 2.30 cm RVOT diam:      2.30 cm TAPSE (M-mode): 2.0 cm LEFT ATRIUM           Index        RIGHT ATRIUM           Index LA diam:      4.20 cm 1.85 cm/m   RA Area:     18.80 cm LA Vol (A2C): 18.1 ml 7.95 ml/m   RA Volume:   44.00 ml  19.33 ml/m LA Vol (A4C): 61.8 ml 27.15 ml/m  AORTIC VALVE             PULMONIC VALVE LVOT Vmax:   100.00 cm/s PR End Diast Vel: 2.13 msec LVOT Vmean:  66.000 cm/s LVOT VTI:    0.201  m  AORTA Ao Root diam: 3.40 cm Ao Asc diam:  3.70 cm MITRAL VALVE               TRICUSPID VALVE MV Area (PHT): 4.12 cm    TR Peak grad:   30.7 mmHg MV Decel Time: 184 msec    TR Vmax:        277.00 cm/s MV E velocity: 71.82 cm/s MV A velocity: 74.95 cm/s  SHUNTS MV E/A ratio:  0.96        Systemic VTI:  0.20 m                            Systemic Diam: 2.10 cm                            Pulmonic Diam: 2.30 cm Gwyndolyn Kaufman MD Electronically signed by Gwyndolyn Kaufman MD Signature Date/Time: 12/10/2021/5:22:38 PM    Final    CT Renal Stone Study  Result Date: 12/08/2021 CLINICAL DATA:  Hematuria EXAM: CT ABDOMEN AND PELVIS WITHOUT CONTRAST TECHNIQUE: Multidetector CT imaging of the abdomen and pelvis was performed following the standard protocol without IV contrast. RADIATION DOSE REDUCTION: This exam was performed according to the departmental dose-optimization program which includes automated exposure control, adjustment of the mA and/or kV according to patient size and/or use of iterative reconstruction technique. COMPARISON:  None. FINDINGS: Lower chest: Trace left pleural effusion. Hypodense blood pool relative to myocardium, suggesting anemia. Hepatobiliary: Unenhanced liver is unremarkable. Gallbladder is unremarkable. No intrahepatic or extrahepatic ductal dilatation. Pancreas: Within normal limits.  Spleen: Enlarged, measuring 22 in maximal craniocaudal dimension. Adrenals/Urinary Tract: Adrenal glands are within normal limits. Kidneys are within normal limits. No renal calculi or hydronephrosis. Thick-walled bladder, although underdistended. Stomach/Bowel: Stomach is notable for mild concentric wall thickening of the gastric antrum (series 3/image 32), although poorly evaluated on CT. No evidence of bowel obstruction. Normal appendix (series 3/image 56). Scattered colonic diverticulosis, without evidence of diverticulitis. Vascular/Lymphatic: No evidence of abdominal aortic aneurysm. Abdominopelvic lymphadenopathy, poorly evaluated on unenhanced CT, including: --2.8 cm short axis node in the porta hepatis (series 3/image 30) --2.8 cm short axis right para-aortic node (series 3/image 46) --3.2 cm short axis left external iliac node (series 3/image 63) --2.8 cm short axis right external iliac node (series 3/image 72) --2.0 cm short axis left inguinal node (series 3/image 88) --1.8 cm short axis right inguinal node (series 3/image 93) Reproductive: Prostate is notable for fiducial markers. Associated presacral soft tissue thickening likely reflects radiation changes. Other: Small volume pelvic ascites. Musculoskeletal: Degenerative changes of the visualized thoracolumbar spine. No focal osseous lesions. IMPRESSION: Abdominopelvic lymphadenopathy with splenomegaly, suggesting lymphoma. Fiducial markers along the prostate, suggesting radiation treatment for prostate cancer. Technically speaking, at least some of the lymphadenopathy could also be related to this diagnosis, although this would not account for splenomegaly. Thick-walled bladder, possibly reflecting radiation changes. Trace left pleural effusion. Electronically Signed   By: Julian Hy M.D.   On: 12/08/2021 23:58   VAS [place] LOWER EXTREMITY VENOUS (DVT) (7a-7p)  Result Date: 12/14/2021  Lower Venous DVT Study Patient Name:  ALMER BUSHEY  Date of  Exam:   12/14/2021 Medical Rec #: [id number]     Accession #:    [id number] Date of Birth: 02/22/1946     Patient Gender: M Patient Age:   31 years Exam Location:  [facility] Procedure:  VAS [place] LOWER EXTREMITY VENOUS (DVT) Referring Phys: BOWIE TRAN --------------------------------------------------------------------------------  Indications: Left lower extremity pitting edema and pain x "months" per patient.  Limitations: Poor ultrasound/tissue interface. Comparison Study: No prior study Performing Technologist: Maudry Mayhew MHA, RDMS, RVT, RDCS  Examination Guidelines: A complete evaluation includes B-mode imaging, spectral Doppler, color Doppler, and power Doppler as needed of all accessible portions of each vessel. Bilateral testing is considered an integral part of a complete examination. Limited examinations for reoccurring indications may be performed as noted. The reflux portion of the exam is performed with the patient in reverse Trendelenburg.  +-----+---------------+---------+-----------+----------+--------------+  RIGHT Compressibility Phasicity Spontaneity Properties Thrombus Aging  +-----+---------------+---------+-----------+----------+--------------+  CFV   Full            Yes       Yes                                    +-----+---------------+---------+-----------+----------+--------------+   +---------+---------------+---------+-----------+----------+--------------+  LEFT      Compressibility Phasicity Spontaneity Properties Thrombus Aging  +---------+---------------+---------+-----------+----------+--------------+  CFV       Full            Yes       Yes                                    +---------+---------------+---------+-----------+----------+--------------+  SFJ       Full                                                             +---------+---------------+---------+-----------+----------+--------------+  FV Prox   Full                                                              +---------+---------------+---------+-----------+----------+--------------+  FV Mid    Full                                                             +---------+---------------+---------+-----------+----------+--------------+  FV Distal Full                                                             +---------+---------------+---------+-----------+----------+--------------+  PFV       Full                                                             +---------+---------------+---------+-----------+----------+--------------+  POP  Full            Yes       Yes                                    +---------+---------------+---------+-----------+----------+--------------+  PTV       Full                                                             +---------+---------------+---------+-----------+----------+--------------+  PERO      Full                                                             +---------+---------------+---------+-----------+----------+--------------+     Summary: RIGHT: - No evidence of common femoral vein obstruction.  LEFT: - There is no evidence of deep vein thrombosis in the lower extremity.  - No cystic structure found in the popliteal fossa. - Ultrasound characteristics of enlarged lymph nodes noted in the groin.  *See table(s) above for measurements and observations. Electronically signed by Harold Barban MD on 12/14/2021 at 7:39:15 PM.    Final     ASSESSMENT & PLAN:   75 year old male with history of hypertension, dyslipidemia, paroxysmal atrial fibrillation on flecainide not on anticoagulation, recent Helicobacter pylori infection, history of prostate cancer treated with radiation therapy and 1 dose of Lupron in June 2022 with  1) Newly diagnosed stage IV mantle cell lymphoma With extensive lymphadenopathy and massive splenomegaly Likely bone marrow involvement.  Will get peripheral blood flow cytometry to evaluate this.  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  3) moderate to severe protein calorie malnutrition  4) acute renal injury-likely due poor p.o. intake.  Cannot rule out spontaneous tumor lysis.  Also could be due to his hematuria and some element of urinary obstruction.  5) massive splenomegaly due to mantle cell lymphoma.  PLAN -Discussed patient's available labs, CT chest abdomen and pelvis in details. -Discussed his lymph node biopsy results showing new diagnosis of mantle cell lymphoma. -We discussed the new diagnosis of stage IV mantle cell lymphoma, natural history, typical course, treatment considerations, treatment options, treatment limitations with his medical comorbidities and recommendations for treatment. 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 -We discussed that his thrombocytopenia and renal insufficiency would be potential limiting factors for his treatment. -We will start him on prednisone 60 mg p.o. daily for 7 days and 40 mg p.o. daily for 7 days. -We will start him on allopurinol for tumor lysis syndrome prophylaxis at 100 mg p.o. twice daily. -Labs today as noted below for additional work-up including peripheral blood flow cytometry to confirm leukemic phase. -Will hold given his first dose of Rituxan within a week. -We will plan to start Bendamustine Rituxan a week after his first dose of Rituxan to try to help with improving his platelets and also to see how his  kidney functions behave prior to dosing his first Bendamustine. -He was recommended to drink at least 2 L of water daily to keep his kidneys flushed. -Will need dietitian consultation. -We will need initial staging PET scan as soon as possible -Chemo counseling for Bendamustine Rituxan -No port necessary at this time but was discussed and patient will  let us know if he wants this for convenience.   Orders Placed This Encounter  Procedures   NM PET Image Initial (PI) Whole Body    Standing Status:   Future    Number of Occurrences:   1    Standing Expiration Date:   12/16/2022    Order Specific Question:   If indicated for the ordered procedure, I authorize the administration of a radiopharmaceutical per Radiology protocol    Answer:   Yes    Order Specific Question:   Preferred imaging location?    Answer:   [facility]   CBC with Differential/Platelet    Standing Status:   Future    Number of Occurrences:   1    Standing Expiration Date:   12/16/2022   CMP ([facility] only)    Standing Status:   Future    Number of Occurrences:   1    Standing Expiration Date:   12/16/2022   Lactate dehydrogenase    Standing Status:   Future    Number of Occurrences:   1    Standing Expiration Date:   12/16/2022   Hepatitis B surface antigen    Standing Status:   Future    Number of Occurrences:   1    Standing Expiration Date:   12/16/2022   Hepatitis B core antibody, total    Standing Status:   Future    Number of Occurrences:   1    Standing Expiration Date:   12/16/2022   Hepatitis C antibody    Standing Status:   Future    Number of Occurrences:   1    Standing Expiration Date:   12/16/2022   HIV Antibody (routine testing w rflx)    Standing Status:   Future    Number of Occurrences:   1    Standing Expiration Date:   12/16/2022   Uric acid    Standing Status:   Future    Number of Occurrences:   1    Standing Expiration Date:   12/16/2022   Multiple Myeloma Panel (SPEP&IFE w/QIG)    Standing Status:   Future    Number of Occurrences:   1    Standing Expiration Date:   12/16/2022   Flow Cytometry, Peripheral Blood (Oncology)    ? Leukemic phase mantle cell lymphoma    Standing Status:   Future    Number of Occurrences:   1    Standing Expiration Date:   12/16/2022   Type and screen         Standing Status:   Future    Number  of Occurrences:   1    Standing Expiration Date:   12/16/2022    Follow-up Labs today PET/CT ASAP Chemo-counseling for Bendamustine/Rituxan Plz schedule to start Bendamustine /Rituxan ASAP in 5 days with labs and MD visit Will need weekly labs, PRBC /PLT transfusion x 1 weekly and MD visit weekly after 1st treatment   The total time spent in the appointment was 80 minutes*.  All of the patient's questions were answered with apparent satisfaction. The patient knows to call the clinic with  any problems, questions or concerns.   Sullivan Lone MD MS AAHIVMS [facility] Hematology/Oncology Physician [facility]  .*Total Encounter Time as defined by the Centers for Medicare and Medicaid Services includes, in addition to the face-to-face time of a patient visit (documented in the note above) non-face-to-face time: obtaining and reviewing outside history, ordering and reviewing medications, tests or procedures, care coordination (communications with other health care professionals or caregivers) and documentation in the medical record.    All of the patients questions were answered with apparent satisfaction. The patient knows to call the clinic with any problems, questions or concerns.  I spent 60 minutes counseling the patient face to face. The total time spent in the appointment was 80 minutes and more than 50% was on counseling and direct patient cares.    Sullivan Lone MD [place] AAHIVMS [facility] Hematology/Oncology Physician [facility]  (Office):       [phone] (Work cell):  [phone] (Fax):           [phone]  12/16/2021 8:51 AM

## 2021-12-16 NOTE — Patient Instructions (Signed)
Thank you for choosing [facility] to provide your care.   Should you have questions after your visit to the [facility] ([facility]), please contact this office at [phone] between 8:30 AM and 4:30 PM.  Voice mails left after 4:00 PM may not be returned until the following business day.  Calls received after 4:30 PM will be answered by an off-site Nurse Triage Line.    Prescription Refills:  Please have your pharmacy contact us directly for most prescription requests.  Contact the office directly for refills of narcotics (pain medications). Allow 48-72 hours for refills.  Appointments: Please contact the [facility] scheduling department [phone] for questions regarding [facility] appointment scheduling.  Contact the schedulers with any scheduling changes so that your appointment can be rescheduled in a timely manner.   Central Scheduling for [facility] [phone] - Call to schedule procedures such as PET scans, CT scans, MRI, Ultrasound, etc.  To afford each patient quality time with our providers, please arrive 30 minutes before your scheduled appointment time.  If you arrive late for your appointment, you may be asked to reschedule.  We strive to give you quality time with our providers, and arriving late affects you and other patients whose appointments are after yours. If you are a no show for multiple scheduled visits, you may be dismissed from the clinic at the providers discretion.     Resources: [facility] Social Workers [phone] for additional information on assistance programs or assistance connecting with community support programs   [place] DSS  [phone]: Information regarding food stamps, Medicaid, and utility assistance [place] Access [place] [phone]   [place] [employer]'s shared-ride transportation service for eligible riders who have a disability that prevents them from riding the fixed route bus.   Medicare Rights Center 800-333-4114  Helps people with Medicare understand their rights and benefits, navigate the Medicare system, and secure the quality healthcare they deserve [employer] 800-227-2345 Assists patients locate various types of support and financial assistance Cancer Care: 1-800-813-HOPE (4673) Provides financial assistance, online support groups, medication/co-pay assistance.   Transportation Assistance for appointments at [facility]: Transportation Coordinator [phone]  Again, thank you for choosing [facility] for your care.       

## 2021-12-16 NOTE — Progress Notes (Signed)
Contacted pt to review upcoming appointments for education, first chemo treatment, and PET scan./ Pt and wife acknowledged dates and times and verbalized understanding of PET scan instructions /no food 6 hours prior to scan , water only.

## 2021-12-16 NOTE — Progress Notes (Signed)
Pharmacist Chemotherapy Monitoring - Initial Assessment    Anticipated start date: 12/23/21   The following has been reviewed per standard work regarding the patient's treatment regimen: The patient's diagnosis, treatment plan and drug doses, and organ/hematologic function Lab orders and baseline tests specific to treatment regimen  The treatment plan start date, drug sequencing, and pre-medications Prior authorization status  Patient's documented medication list, including drug-drug interaction screen and prescriptions for anti-emetics and supportive care specific to the treatment regimen The drug concentrations, fluid compatibility, administration routes, and timing of the medications to be used The patient's access for treatment and lifetime cumulative dose history, if applicable  The patient's medication allergies and previous infusion related reactions, if applicable   Changes made to treatment plan:  N/A  Follow up needed:  Pending authorization for treatment    Philomena Course, [place], 12/16/2021  3:05 PM

## 2021-12-16 NOTE — Progress Notes (Signed)
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 

## 2021-12-20 ENCOUNTER — Other Ambulatory Visit: Payer: Self-pay

## 2021-12-20 ENCOUNTER — Encounter: Payer: Self-pay | Admitting: Hematology

## 2021-12-20 ENCOUNTER — Inpatient Hospital Stay: Payer: Medicare Other

## 2021-12-20 ENCOUNTER — Other Ambulatory Visit: Payer: Self-pay | Admitting: Hematology

## 2021-12-20 VITALS — BP 132/73 | HR 87 | Resp 18

## 2021-12-20 DIAGNOSIS — E883 Tumor lysis syndrome: Secondary | ICD-10-CM

## 2021-12-20 DIAGNOSIS — C8318 Mantle cell lymphoma, lymph nodes of multiple sites: Secondary | ICD-10-CM

## 2021-12-20 LAB — BASIC METABOLIC PANEL - CANCER CENTER ONLY
Anion gap: 3 — ABNORMAL LOW (ref 5–15)
BUN: 32 mg/dL — ABNORMAL HIGH (ref 8–23)
CO2: 30 mmol/L (ref 22–32)
Calcium: 9.2 mg/dL (ref 8.9–10.3)
Chloride: 100 mmol/L (ref 98–111)
Creatinine: 2.22 mg/dL — ABNORMAL HIGH (ref 0.61–1.24)
GFR, Estimated: 30 mL/min — ABNORMAL LOW (ref >60)
Glucose, Bld: 110 mg/dL — ABNORMAL HIGH (ref 70–99)
Potassium: 4.8 mmol/L (ref 3.5–5.1)
Sodium: 133 mmol/L — ABNORMAL LOW (ref 135–145)

## 2021-12-20 LAB — MULTIPLE MYELOMA PANEL, SERUM
Albumin SerPl Elph-Mcnc: 3.2 g/dL (ref 2.9–4.4)
Albumin/Glob SerPl: 0.7 (ref 0.7–1.7)
Alpha 1: 0.2 g/dL (ref 0.0–0.4)
Alpha2 Glob SerPl Elph-Mcnc: 0.3 g/dL — ABNORMAL LOW (ref 0.4–1.0)
B-Globulin SerPl Elph-Mcnc: 1 g/dL (ref 0.7–1.3)
Gamma Glob SerPl Elph-Mcnc: 3.1 g/dL — ABNORMAL HIGH (ref 0.4–1.8)
Globulin, Total: 4.6 g/dL — ABNORMAL HIGH (ref 2.2–3.9)
IgA: 306 mg/dL (ref 61–437)
IgG (Immunoglobin G), Serum: 3427 mg/dL — ABNORMAL HIGH (ref 603–1613)
IgM (Immunoglobulin M), Srm: 60 mg/dL (ref 15–143)
Total Protein ELP: 7.8 g/dL (ref 6.0–8.5)

## 2021-12-20 LAB — CBC WITH DIFFERENTIAL/PLATELET
Abs Immature Granulocytes: 0.04 10*3/uL (ref 0.00–0.07)
Basophils Absolute: 0 10*3/uL (ref 0.0–0.1)
Basophils Relative: 1 %
Eosinophils Absolute: 0 10*3/uL (ref 0.0–0.5)
Eosinophils Relative: 0 %
HCT: 28.5 % — ABNORMAL LOW (ref 39.0–52.0)
Hemoglobin: 9 g/dL — ABNORMAL LOW (ref 13.0–17.0)
Immature Granulocytes: 1 %
Lymphocytes Relative: 43 %
Lymphs Abs: 2 10*3/uL (ref 0.7–4.0)
MCH: 29.5 pg (ref 26.0–34.0)
MCHC: 31.6 g/dL (ref 30.0–36.0)
MCV: 93.4 fL (ref 80.0–100.0)
Monocytes Absolute: 0.2 10*3/uL (ref 0.1–1.0)
Monocytes Relative: 5 %
Neutro Abs: 2.3 10*3/uL (ref 1.7–7.7)
Neutrophils Relative %: 50 %
Platelets: 50 10*3/uL — ABNORMAL LOW (ref 150–400)
RBC: 3.05 MIL/uL — ABNORMAL LOW (ref 4.22–5.81)
RDW: 17.6 % — ABNORMAL HIGH (ref 11.5–15.5)
Smear Review: NORMAL
WBC: 4.5 10*3/uL (ref 4.0–10.5)
nRBC: 0 % (ref 0.0–0.2)

## 2021-12-20 LAB — MAGNESIUM: Magnesium: 1.8 mg/dL (ref 1.7–2.4)

## 2021-12-20 LAB — RASBURICASE - URIC ACID: Uric Acid, Serum: 15.1 mg/dL — ABNORMAL HIGH (ref 3.7–8.6)

## 2021-12-20 LAB — PHOSPHORUS: Phosphorus: 4.4 mg/dL (ref 2.5–4.6)

## 2021-12-20 MED ORDER — SODIUM CHLORIDE 0.9 % IV SOLN
3.0000 mg | Freq: Once | INTRAVENOUS | Status: AC
  Administered 2021-12-20: 3 mg via INTRAVENOUS
  Filled 2021-12-20: qty 2

## 2021-12-20 MED ORDER — SODIUM CHLORIDE 0.9 % IV SOLN: Freq: Once | INTRAVENOUS | Status: AC

## 2021-12-20 NOTE — Patient Instructions (Signed)
Rasburicase Injection What is this medication? RASBURICASE (ras BURE i kase) breaks down uric acid in the blood. It is used to prevent and to treat high levels of uric acid caused by cancer treatment. This medicine may be used for other purposes; ask your health care provider or pharmacist if you have questions. COMMON BRAND NAME(S): Elitek What should I tell my care team before I take this medication? They need to know if you have any of these conditions: G6PD deficiency history of anemia history of blood transfusion an unusual or allergic reaction to rasburicase, yeast, mannitol, other medicines, foods, dyes, or preservatives pregnant or trying to get pregnant breast-feeding How should I use this medication? This medicine is for infusion into a vein. It is given by a health care professional in a hospital or clinic setting. Talk to your pediatrician regarding the use of this medicine in children. While this drug may be prescribed for children as young as 46 month old for selected conditions, precautions do apply. Overdosage: If you think you have taken too much of this medicine contact a poison control center or emergency room at once. NOTE: This medicine is only for you. Do not share this medicine with others. What if I miss a dose? This does not apply. What may interact with this medication? Interactions have not been studied. Give your health care provider a list of all the medicines, herbs, non-prescription drugs, or dietary supplements you use. Also tell them if you smoke, drink alcohol, or use illegal drugs. Some items may interact with your medicine. This list may not describe all possible interactions. Give your health care provider a list of all the medicines, herbs, non-prescription drugs, or dietary supplements you use. Also tell them if you smoke, drink alcohol, or use illegal drugs. Some items may interact with your medicine. What should I watch for while using this  medication? Your condition will be monitored carefully while you are receiving this medicine. You will need to have regular blood tests during your treatment. Do not breast-feed an infant while taking this medicine or for 2 weeks after stopping it. What side effects may I notice from receiving this medication? Side effects that you should report to your doctor or health care professional as soon as possible: allergic reactions like skin rash, itching or hives, swelling of the face, lips, or tongue blue color to lips or nailbeds breathing problems chest pain, tightness confusion fast, irregular heartbeat feeling faint or lightheaded, falls low blood pressure seizures unusually weak or tired yellowing of the eyes or skin Side effects that usually do not require medical attention (report to your doctor or health care professional if they continue or are bothersome): abdominal pain constipation diarrhea fever headache nausea, vomiting swelling of the ankles, feet, hands This list may not describe all possible side effects. Call your doctor for medical advice about side effects. You may report side effects to FDA at 1-800-FDA-1088. Where should I keep my medication? This drug is given in a hospital or clinic and will not be stored at home. NOTE: This sheet is a summary. It may not cover all possible information. If you have questions about this medicine, talk to your doctor, pharmacist, or health care provider.  2022 Elsevier/Gold Standard (2018-10-15 00:00:00)

## 2021-12-20 NOTE — Progress Notes (Signed)
Ok to change rasburicase dose to 3mg  per Dr Irene Limbo

## 2021-12-20 NOTE — Progress Notes (Signed)
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 We will set up for IV normal saline 1 L daily for 3 days Daily labs Rasburicase 6 mg IV piggyback today.  Additional doses based on uric acid levels. Already started on allopurinol 100 mg p.o. twice daily. Rituxan first dose as soon as possible.  Jerry Tran

## 2021-12-21 ENCOUNTER — Inpatient Hospital Stay: Payer: Medicare Other

## 2021-12-21 ENCOUNTER — Other Ambulatory Visit: Payer: Self-pay | Admitting: Hematology

## 2021-12-21 ENCOUNTER — Encounter: Payer: Self-pay | Admitting: Hematology

## 2021-12-21 VITALS — BP 136/80 | HR 78 | Temp 97.8°F | Resp 18 | Wt 208.0 lb

## 2021-12-21 DIAGNOSIS — C8318 Mantle cell lymphoma, lymph nodes of multiple sites: Secondary | ICD-10-CM

## 2021-12-21 DIAGNOSIS — E883 Tumor lysis syndrome: Secondary | ICD-10-CM

## 2021-12-21 LAB — MAGNESIUM: Magnesium: 2.1 mg/dL (ref 1.7–2.4)

## 2021-12-21 LAB — BASIC METABOLIC PANEL - CANCER CENTER ONLY
Anion gap: 5 (ref 5–15)
BUN: 38 mg/dL — ABNORMAL HIGH (ref 8–23)
CO2: 25 mmol/L (ref 22–32)
Calcium: 8.8 mg/dL — ABNORMAL LOW (ref 8.9–10.3)
Chloride: 100 mmol/L (ref 98–111)
Creatinine: 2.29 mg/dL — ABNORMAL HIGH (ref 0.61–1.24)
GFR, Estimated: 29 mL/min — ABNORMAL LOW (ref >60)
Glucose, Bld: 165 mg/dL — ABNORMAL HIGH (ref 70–99)
Potassium: 4.6 mmol/L (ref 3.5–5.1)
Sodium: 130 mmol/L — ABNORMAL LOW (ref 135–145)

## 2021-12-21 LAB — PHOSPHORUS: Phosphorus: 3.8 mg/dL (ref 2.5–4.6)

## 2021-12-21 LAB — RASBURICASE - URIC ACID: Uric Acid, Serum: 6.8 mg/dL (ref 3.7–8.6)

## 2021-12-21 MED ORDER — SODIUM CHLORIDE 0.9 % IV SOLN: Freq: Once | INTRAVENOUS | Status: AC

## 2021-12-21 NOTE — Patient Instructions (Signed)

## 2021-12-22 ENCOUNTER — Encounter: Payer: Self-pay | Admitting: Hematology

## 2021-12-22 ENCOUNTER — Inpatient Hospital Stay: Payer: Medicare Other

## 2021-12-22 ENCOUNTER — Other Ambulatory Visit: Payer: Self-pay

## 2021-12-22 ENCOUNTER — Other Ambulatory Visit: Payer: Self-pay | Admitting: Hematology

## 2021-12-22 VITALS — BP 118/72 | HR 70 | Temp 98.7°F | Resp 20 | Ht 76.0 in | Wt 208.2 lb

## 2021-12-22 DIAGNOSIS — C8318 Mantle cell lymphoma, lymph nodes of multiple sites: Secondary | ICD-10-CM

## 2021-12-22 DIAGNOSIS — E883 Tumor lysis syndrome: Secondary | ICD-10-CM

## 2021-12-22 LAB — BASIC METABOLIC PANEL - CANCER CENTER ONLY
Anion gap: 5 (ref 5–15)
BUN: 39 mg/dL — ABNORMAL HIGH (ref 8–23)
CO2: 25 mmol/L (ref 22–32)
Calcium: 9 mg/dL (ref 8.9–10.3)
Chloride: 105 mmol/L (ref 98–111)
Creatinine: 1.84 mg/dL — ABNORMAL HIGH (ref 0.61–1.24)
GFR, Estimated: 38 mL/min — ABNORMAL LOW (ref >60)
Glucose, Bld: 134 mg/dL — ABNORMAL HIGH (ref 70–99)
Potassium: 4 mmol/L (ref 3.5–5.1)
Sodium: 135 mmol/L (ref 135–145)

## 2021-12-22 LAB — RASBURICASE - URIC ACID: Uric Acid, Serum: 4.6 mg/dL (ref 3.7–8.6)

## 2021-12-22 LAB — PHOSPHORUS: Phosphorus: 3.9 mg/dL (ref 2.5–4.6)

## 2021-12-22 LAB — MAGNESIUM: Magnesium: 1.9 mg/dL (ref 1.7–2.4)

## 2021-12-22 LAB — SURGICAL PATHOLOGY

## 2021-12-22 MED ORDER — SODIUM CHLORIDE 0.9 % IV SOLN: Freq: Once | INTRAVENOUS | Status: AC

## 2021-12-22 MED FILL — Dexamethasone Sodium Phosphate Inj 100 MG/10ML: INTRAMUSCULAR | Qty: 1 | Status: AC

## 2021-12-22 NOTE — Progress Notes (Signed)
Received call from patient's spouse regarding my card being given at check in per my request.  Introduced myself as Arboriculturist and to offer available resources.  Patient has 2 insurances therefore copay assistance is not needed/available.  Discussed one-time $1000 [employer] grant to assist with personal expenses while going through treatment. Provided verbal income guidelines for household size. She states she will call me back tomorrow with information as they are interested in applying.  She has my card to do so and for any additional financial questions or concerns.

## 2021-12-22 NOTE — Progress Notes (Signed)
Per Dr. Irene Limbo, no Rasburicase today with current labs. Will resume tomorrow.

## 2021-12-22 NOTE — Patient Instructions (Signed)

## 2021-12-23 ENCOUNTER — Inpatient Hospital Stay: Payer: Medicare Other

## 2021-12-23 ENCOUNTER — Inpatient Hospital Stay (HOSPITAL_BASED_OUTPATIENT_CLINIC_OR_DEPARTMENT_OTHER): Payer: Medicare Other | Admitting: Hematology

## 2021-12-23 ENCOUNTER — Ambulatory Visit (HOSPITAL_COMMUNITY): Payer: No Typology Code available for payment source

## 2021-12-23 ENCOUNTER — Other Ambulatory Visit: Payer: Self-pay

## 2021-12-23 VITALS — BP 138/83 | HR 63 | Temp 98.3°F | Resp 18 | Wt 212.8 lb

## 2021-12-23 DIAGNOSIS — E883 Tumor lysis syndrome: Secondary | ICD-10-CM

## 2021-12-23 DIAGNOSIS — C8318 Mantle cell lymphoma, lymph nodes of multiple sites: Secondary | ICD-10-CM

## 2021-12-23 DIAGNOSIS — Z7189 Other specified counseling: Secondary | ICD-10-CM

## 2021-12-23 LAB — CBC WITH DIFFERENTIAL (CANCER CENTER ONLY)
Abs Immature Granulocytes: 0.01 10*3/uL (ref 0.00–0.07)
Basophils Absolute: 0 10*3/uL (ref 0.0–0.1)
Basophils Relative: 1 %
Eosinophils Absolute: 0 10*3/uL (ref 0.0–0.5)
Eosinophils Relative: 0 %
HCT: 25 % — ABNORMAL LOW (ref 39.0–52.0)
Hemoglobin: 7.9 g/dL — ABNORMAL LOW (ref 13.0–17.0)
Immature Granulocytes: 0 %
Lymphocytes Relative: 44 %
Lymphs Abs: 1.5 10*3/uL (ref 0.7–4.0)
MCH: 29.6 pg (ref 26.0–34.0)
MCHC: 31.6 g/dL (ref 30.0–36.0)
MCV: 93.6 fL (ref 80.0–100.0)
Monocytes Absolute: 0.2 10*3/uL (ref 0.1–1.0)
Monocytes Relative: 4 %
Neutro Abs: 1.8 10*3/uL (ref 1.7–7.7)
Neutrophils Relative %: 51 %
Platelet Count: 53 10*3/uL — ABNORMAL LOW (ref 150–400)
RBC: 2.67 MIL/uL — ABNORMAL LOW (ref 4.22–5.81)
RDW: 17.1 % — ABNORMAL HIGH (ref 11.5–15.5)
Smear Review: NORMAL
WBC Count: 3.5 10*3/uL — ABNORMAL LOW (ref 4.0–10.5)
nRBC: 0 % (ref 0.0–0.2)

## 2021-12-23 LAB — CMP (CANCER CENTER ONLY)
ALT: 10 U/L (ref 0–44)
AST: 28 U/L (ref 15–41)
Albumin: 3.2 g/dL — ABNORMAL LOW (ref 3.5–5.0)
Alkaline Phosphatase: 73 U/L (ref 38–126)
Anion gap: 6 (ref 5–15)
BUN: 40 mg/dL — ABNORMAL HIGH (ref 8–23)
CO2: 26 mmol/L (ref 22–32)
Calcium: 9 mg/dL (ref 8.9–10.3)
Chloride: 106 mmol/L (ref 98–111)
Creatinine: 1.62 mg/dL — ABNORMAL HIGH (ref 0.61–1.24)
GFR, Estimated: 44 mL/min — ABNORMAL LOW (ref >60)
Glucose, Bld: 146 mg/dL — ABNORMAL HIGH (ref 70–99)
Potassium: 3.9 mmol/L (ref 3.5–5.1)
Sodium: 138 mmol/L (ref 135–145)
Total Bilirubin: 0.6 mg/dL (ref 0.3–1.2)
Total Protein: 8.1 g/dL (ref 6.5–8.1)

## 2021-12-23 LAB — HEPATITIS B DNA, ULTRAQUANTITATIVE, PCR
HBV DNA SERPL PCR-ACNC: NOT DETECTED IU/mL
HBV DNA SERPL PCR-LOG IU: UNDETERMINED log10 IU/mL

## 2021-12-23 LAB — URIC ACID: Uric Acid, Serum: 4.3 mg/dL (ref 3.7–8.6)

## 2021-12-23 LAB — FLOW CYTOMETRY

## 2021-12-23 MED ORDER — SODIUM CHLORIDE 0.9 % IV SOLN
3.0000 mg | Freq: Once | INTRAVENOUS | Status: AC
  Administered 2021-12-23: 3 mg via INTRAVENOUS
  Filled 2021-12-23: qty 2

## 2021-12-23 MED ORDER — SODIUM CHLORIDE 0.9 % IV SOLN: Freq: Once | INTRAVENOUS | Status: AC

## 2021-12-23 MED ORDER — SODIUM CHLORIDE 0.9 % IV SOLN
10.0000 mg | Freq: Once | INTRAVENOUS | Status: AC
  Administered 2021-12-23: 10 mg via INTRAVENOUS
  Filled 2021-12-23: qty 10

## 2021-12-23 MED ORDER — DIPHENHYDRAMINE HCL 25 MG PO CAPS
50.0000 mg | ORAL_CAPSULE | Freq: Once | ORAL | Status: AC
  Administered 2021-12-23: 50 mg via ORAL
  Filled 2021-12-23: qty 2

## 2021-12-23 MED ORDER — ACETAMINOPHEN 325 MG PO TABS
650.0000 mg | ORAL_TABLET | Freq: Once | ORAL | Status: AC
  Administered 2021-12-23: 650 mg via ORAL
  Filled 2021-12-23: qty 2

## 2021-12-23 MED ORDER — FAMOTIDINE IN NACL 20-0.9 MG/50ML-% IV SOLN
20.0000 mg | Freq: Once | INTRAVENOUS | Status: AC
  Administered 2021-12-23: 20 mg via INTRAVENOUS
  Filled 2021-12-23: qty 50

## 2021-12-23 MED ORDER — SODIUM CHLORIDE 0.9 % IV SOLN
375.0000 mg/m2 | Freq: Once | INTRAVENOUS | Status: AC
  Administered 2021-12-23: 800 mg via INTRAVENOUS
  Filled 2021-12-23: qty 50

## 2021-12-23 NOTE — Patient Instructions (Signed)
[facility] ONCOL[facility]  Discharge Instructions: Thank you for choosing [facility] to provide your oncology and hematology care.   If you have a lab appointment with the [facility], please go directly to the [facility] and check in at the registration area.   Wear comfortable clothing and clothing appropriate for easy access to any Portacath or PICC line.   We strive to give you quality time with your provider. You may need to reschedule your appointment if you arrive late (15 or more minutes).  Arriving late affects you and other patients whose appointments are after yours.  Also, if you miss three or more appointments without notifying the office, you may be dismissed from the clinic at the providers discretion.      For prescription refill requests, have your pharmacy contact our office and allow 72 hours for refills to be completed.    Today you received the following chemotherapy and/or immunotherapy agents Rituximab       To help prevent nausea and vomiting after your treatment, we encourage you to take your nausea medication as directed.  BELOW ARE SYMPTOMS THAT SHOULD BE REPORTED IMMEDIATELY: *FEVER GREATER THAN 100.4 F (38 C) OR HIGHER *CHILLS OR SWEATING *NAUSEA AND VOMITING THAT IS NOT CONTROLLED WITH YOUR NAUSEA MEDICATION *UNUSUAL SHORTNESS OF BREATH *UNUSUAL BRUISING OR BLEEDING *URINARY PROBLEMS (pain or burning when urinating, or frequent urination) *BOWEL PROBLEMS (unusual diarrhea, constipation, pain near the anus) TENDERNESS IN MOUTH AND THROAT WITH OR WITHOUT PRESENCE OF ULCERS (sore throat, sores in mouth, or a toothache) UNUSUAL RASH, SWELLING OR PAIN  UNUSUAL VAGINAL DISCHARGE OR ITCHING   Items with * indicate a potential emergency and should be followed up as soon as possible or go to the Emergency Department if any problems should occur.  Please show the CHEMOTHERAPY ALERT CARD or IMMUNOTHERAPY ALERT CARD at check-in to  the Emergency Department and triage nurse.  Should you have questions after your visit or need to cancel or reschedule your appointment, please contact [facility]  Dept: [phone]  and follow the prompts.  Office hours are 8:00 a.m. to 4:30 p.m. Monday - Friday. Please note that voicemails left after 4:00 p.m. may not be returned until the following business day.  We are closed weekends and major holidays. You have access to a nurse at all times for urgent questions. Please call the main number to the clinic Dept: [phone] and follow the prompts.   For any non-urgent questions, you may also contact your provider using MyChart. We now offer e-Visits for anyone 44 and older to request care online for non-urgent symptoms. For details visit [email or web address].   Also download the MyChart app! Go to the app store, search "MyChart", open the app, select [facility], and log in with your MyChart username and password.  Due to Covid, a mask is required upon entering the hospital/clinic. If you do not have a mask, one will be given to you upon arrival. For doctor visits, patients may have 1 support person aged 39 or older with them. For treatment visits, patients cannot have anyone with them due to current Covid guidelines and our immunocompromised population.   Rituximab Injection What is this medication? RITUXIMAB (ri TUX i mab) is a monoclonal antibody. It is used to treat certain types of cancer like non-Hodgkin lymphoma and chronic lymphocytic leukemia. It is also used to treat rheumatoid arthritis, granulomatosis with polyangiitis, microscopic polyangiitis, and pemphigus vulgaris. This  medicine may be used for other purposes; ask your health care provider or pharmacist if you have questions. COMMON BRAND NAME(S): RIABNI, Rituxan, RUXIENCE What should I tell my care team before I take this medication? They need to know if you have any of these  conditions: chest pain heart disease infection especially a viral infection such as chickenpox, cold sores, hepatitis B, or herpes immune system problems irregular heartbeat or rhythm kidney disease low blood counts (white cells, platelets, or red cells) lung disease recent or upcoming vaccine an unusual or allergic reaction to rituximab, other medicines, foods, dyes, or preservatives pregnant or trying to get pregnant breast-feeding How should I use this medication? This medicine is injected into a vein. It is given by a health care provider in a hospital or clinic setting. A special MedGuide will be given to you before each treatment. Be sure to read this information carefully each time. Talk to your health care provider about the use of this medicine in children. While this drug may be prescribed for children as young as 6 months for selected conditions, precautions do apply. Overdosage: If you think you have taken too much of this medicine contact a poison control center or emergency room at once. NOTE: This medicine is only for you. Do not share this medicine with others. What if I miss a dose? Keep appointments for follow-up doses. It is important not to miss your dose. Call your health care provider if you are unable to keep an appointment. What may interact with this medication? Do not take this medicine with any of the following medicines: live vaccines This medicine may also interact with the following medicines: cisplatin This list may not describe all possible interactions. Give your health care provider a list of all the medicines, herbs, non-prescription drugs, or dietary supplements you use. Also tell them if you smoke, drink alcohol, or use illegal drugs. Some items may interact with your medicine. What should I watch for while using this medication? Your condition will be monitored carefully while you are receiving this medicine. You may need blood work done while you are  taking this medicine. This medicine can cause serious infusion reactions. To reduce the risk your health care provider may give you other medicines to take before receiving this one. Be sure to follow the directions from your health care provider. This medicine may increase your risk of getting an infection. Call your health care provider for advice if you get a fever, chills, sore throat, or other symptoms of a cold or flu. Do not treat yourself. Try to avoid being around people who are sick. Call your health care provider if you are around anyone with measles, chickenpox, or if you develop sores or blisters that do not heal properly. Avoid taking medicines that contain aspirin, acetaminophen, ibuprofen, naproxen, or ketoprofen unless instructed by your health care provider. These medicines may hide a fever. This medicine may cause serious skin reactions. They can happen weeks to months after starting the medicine. Contact your health care provider right away if you notice fevers or flu-like symptoms with a rash. The rash may be red or purple and then turn into blisters or peeling of the skin. Or, you might notice a red rash with swelling of the face, lips or lymph nodes in your neck or under your arms. In some patients, this medicine may cause a serious brain infection that may cause death. If you have any problems seeing, thinking, speaking, walking, or standing, tell  your healthcare professional right away. If you cannot reach your healthcare professional, urgently seek other source of medical care. Do not become pregnant while taking this medicine or for at least 12 months after stopping it. Women should inform their health care provider if they wish to become pregnant or think they might be pregnant. There is potential for serious harm to an unborn child. Talk to your health care provider for more information. Women should use a reliable form of birth control while taking this medicine and for 12 months  after stopping it. Do not breast-feed while taking this medicine or for at least 6 months after stopping it. What side effects may I notice from receiving this medication? Side effects that you should report to your health care provider as soon as possible: allergic reactions (skin rash, itching or hives; swelling of the face, lips, or tongue) diarrhea edema (sudden weight gain; swelling of the ankles, feet, hands or other unusual swelling; trouble breathing) fast, irregular heartbeat heart attack (trouble breathing; pain or tightness in the chest, neck, back or arms; unusually weak or tired) infection (fever, chills, cough, sore throat, pain or trouble passing urine) kidney injury (trouble passing urine or change in the amount of urine) liver injury (dark yellow or brown urine; general ill feeling or flu-like symptoms; loss of appetite, right upper belly pain; unusually weak or tired, yellowing of the eyes or skin) low blood pressure (dizziness; feeling faint or lightheaded, falls; unusually weak or tired) low red blood cell counts (trouble breathing; feeling faint; lightheaded, falls; unusually weak or tired) mouth sores redness, blistering, peeling, or loosening of the skin, including inside the mouth stomach pain unusual bruising or bleeding wheezing (trouble breathing with loud or whistling sounds) vomiting Side effects that usually do not require medical attention (report to your health care provider if they continue or are bothersome): headache joint pain muscle cramps, pain nausea This list may not describe all possible side effects. Call your doctor for medical advice about side effects. You may report side effects to FDA at 1-800-FDA-1088. Where should I keep my medication? This medicine is given in a hospital or clinic. It will not be stored at home. NOTE: This sheet is a summary. It may not cover all possible information. If you have questions about this medicine, talk to your  doctor, pharmacist, or health care provider.  2022 Elsevier/Gold Standard (2020-10-18 00:00:00)

## 2021-12-23 NOTE — Progress Notes (Signed)
Ok to proceed today with labs per Dr.Kale.

## 2021-12-26 ENCOUNTER — Inpatient Hospital Stay: Payer: Medicare Other

## 2021-12-26 ENCOUNTER — Telehealth: Payer: Self-pay

## 2021-12-26 ENCOUNTER — Other Ambulatory Visit: Payer: Self-pay

## 2021-12-26 DIAGNOSIS — C8318 Mantle cell lymphoma, lymph nodes of multiple sites: Secondary | ICD-10-CM | POA: Diagnosis not present

## 2021-12-26 DIAGNOSIS — E883 Tumor lysis syndrome: Secondary | ICD-10-CM

## 2021-12-26 LAB — CMP (CANCER CENTER ONLY)
ALT: 14 U/L (ref 0–44)
AST: 30 U/L (ref 15–41)
Albumin: 3.3 g/dL — ABNORMAL LOW (ref 3.5–5.0)
Alkaline Phosphatase: 81 U/L (ref 38–126)
Anion gap: 6 (ref 5–15)
BUN: 51 mg/dL — ABNORMAL HIGH (ref 8–23)
CO2: 29 mmol/L (ref 22–32)
Calcium: 9.7 mg/dL (ref 8.9–10.3)
Chloride: 100 mmol/L (ref 98–111)
Creatinine: 1.66 mg/dL — ABNORMAL HIGH (ref 0.61–1.24)
GFR, Estimated: 43 mL/min — ABNORMAL LOW (ref >60)
Glucose, Bld: 146 mg/dL — ABNORMAL HIGH (ref 70–99)
Potassium: 3.6 mmol/L (ref 3.5–5.1)
Sodium: 135 mmol/L (ref 135–145)
Total Bilirubin: 0.6 mg/dL (ref 0.3–1.2)
Total Protein: 8.1 g/dL (ref 6.5–8.1)

## 2021-12-26 LAB — CBC WITH DIFFERENTIAL (CANCER CENTER ONLY)
Abs Immature Granulocytes: 0.02 10*3/uL (ref 0.00–0.07)
Basophils Absolute: 0 10*3/uL (ref 0.0–0.1)
Basophils Relative: 1 %
Eosinophils Absolute: 0 10*3/uL (ref 0.0–0.5)
Eosinophils Relative: 0 %
HCT: 25.3 % — ABNORMAL LOW (ref 39.0–52.0)
Hemoglobin: 8.2 g/dL — ABNORMAL LOW (ref 13.0–17.0)
Immature Granulocytes: 1 %
Lymphocytes Relative: 42 %
Lymphs Abs: 1.3 10*3/uL (ref 0.7–4.0)
MCH: 29.6 pg (ref 26.0–34.0)
MCHC: 32.4 g/dL (ref 30.0–36.0)
MCV: 91.3 fL (ref 80.0–100.0)
Monocytes Absolute: 0.3 10*3/uL (ref 0.1–1.0)
Monocytes Relative: 9 %
Neutro Abs: 1.5 10*3/uL — ABNORMAL LOW (ref 1.7–7.7)
Neutrophils Relative %: 47 %
Platelet Count: 50 10*3/uL — ABNORMAL LOW (ref 150–400)
RBC: 2.77 MIL/uL — ABNORMAL LOW (ref 4.22–5.81)
RDW: 16.6 % — ABNORMAL HIGH (ref 11.5–15.5)
Smear Review: NORMAL
WBC Count: 3.2 10*3/uL — ABNORMAL LOW (ref 4.0–10.5)
nRBC: 0 % (ref 0.0–0.2)

## 2021-12-26 LAB — SAMPLE TO BLOOD BANK

## 2021-12-26 LAB — URIC ACID: Uric Acid, Serum: 2.9 mg/dL — ABNORMAL LOW (ref 3.7–8.6)

## 2021-12-26 NOTE — Progress Notes (Signed)
Per Dr. Irene Limbo - patient does not require blood transfusion today with hemoglobin of 8.2.  Patient made aware and agreeable to change in treatment plan.

## 2021-12-26 NOTE — Telephone Encounter (Signed)
Contacted pt to follow up after his first chemo tx. Pt doing well, no nausea, vomiting or GI upset. Pt verbalized understanding of what to do if he has any GI issues. Pt drinking well and resting. No issues at this time. Instructed pt to call for any concerns or questions.

## 2021-12-28 ENCOUNTER — Other Ambulatory Visit: Payer: Self-pay

## 2021-12-28 ENCOUNTER — Encounter (HOSPITAL_COMMUNITY)
Admission: RE | Admit: 2021-12-28 | Discharge: 2021-12-28 | Disposition: A | Discharge status: Home / Self Care | Payer: Medicare Other | Source: Ambulatory Visit | Attending: Hematology | Admitting: Hematology

## 2021-12-28 ENCOUNTER — Inpatient Hospital Stay (HOSPITAL_BASED_OUTPATIENT_CLINIC_OR_DEPARTMENT_OTHER): Payer: Medicare Other | Admitting: Hematology

## 2021-12-28 ENCOUNTER — Inpatient Hospital Stay: Payer: Medicare Other | Attending: Hematology and Oncology

## 2021-12-28 VITALS — BP 104/57 | HR 70 | Temp 97.0°F | Resp 20 | Wt 210.7 lb

## 2021-12-28 DIAGNOSIS — E43 Unspecified severe protein-calorie malnutrition: Secondary | ICD-10-CM | POA: Insufficient documentation

## 2021-12-28 DIAGNOSIS — Z87891 Personal history of nicotine dependence: Secondary | ICD-10-CM | POA: Insufficient documentation

## 2021-12-28 DIAGNOSIS — E883 Tumor lysis syndrome: Secondary | ICD-10-CM | POA: Diagnosis not present

## 2021-12-28 DIAGNOSIS — R161 Splenomegaly, not elsewhere classified: Secondary | ICD-10-CM

## 2021-12-28 DIAGNOSIS — Z5111 Encounter for antineoplastic chemotherapy: Secondary | ICD-10-CM

## 2021-12-28 DIAGNOSIS — D630 Anemia in neoplastic disease: Secondary | ICD-10-CM

## 2021-12-28 DIAGNOSIS — D6959 Other secondary thrombocytopenia: Secondary | ICD-10-CM | POA: Insufficient documentation

## 2021-12-28 DIAGNOSIS — C8318 Mantle cell lymphoma, lymph nodes of multiple sites: Secondary | ICD-10-CM | POA: Insufficient documentation

## 2021-12-28 DIAGNOSIS — R600 Localized edema: Secondary | ICD-10-CM

## 2021-12-28 DIAGNOSIS — D696 Thrombocytopenia, unspecified: Secondary | ICD-10-CM

## 2021-12-28 DIAGNOSIS — Z5189 Encounter for other specified aftercare: Secondary | ICD-10-CM | POA: Insufficient documentation

## 2021-12-28 DIAGNOSIS — D649 Anemia, unspecified: Secondary | ICD-10-CM

## 2021-12-28 LAB — CBC WITH DIFFERENTIAL (CANCER CENTER ONLY)
Abs Immature Granulocytes: 0.01 10*3/uL (ref 0.00–0.07)
Basophils Absolute: 0 10*3/uL (ref 0.0–0.1)
Basophils Relative: 0 %
Eosinophils Absolute: 0 10*3/uL (ref 0.0–0.5)
Eosinophils Relative: 0 %
HCT: 26.1 % — ABNORMAL LOW (ref 39.0–52.0)
Hemoglobin: 8.3 g/dL — ABNORMAL LOW (ref 13.0–17.0)
Immature Granulocytes: 0 %
Lymphocytes Relative: 35 %
Lymphs Abs: 1.1 10*3/uL (ref 0.7–4.0)
MCH: 29.5 pg (ref 26.0–34.0)
MCHC: 31.8 g/dL (ref 30.0–36.0)
MCV: 92.9 fL (ref 80.0–100.0)
Monocytes Absolute: 0.3 10*3/uL (ref 0.1–1.0)
Monocytes Relative: 8 %
Neutro Abs: 1.7 10*3/uL (ref 1.7–7.7)
Neutrophils Relative %: 57 %
Platelet Count: 58 10*3/uL — ABNORMAL LOW (ref 150–400)
RBC: 2.81 MIL/uL — ABNORMAL LOW (ref 4.22–5.81)
RDW: 16.8 % — ABNORMAL HIGH (ref 11.5–15.5)
WBC Count: 3.1 10*3/uL — ABNORMAL LOW (ref 4.0–10.5)
nRBC: 0 % (ref 0.0–0.2)

## 2021-12-28 LAB — CMP (CANCER CENTER ONLY)
ALT: 19 U/L (ref 0–44)
AST: 30 U/L (ref 15–41)
Albumin: 3.2 g/dL — ABNORMAL LOW (ref 3.5–5.0)
Alkaline Phosphatase: 78 U/L (ref 38–126)
Anion gap: 5 (ref 5–15)
BUN: 48 mg/dL — ABNORMAL HIGH (ref 8–23)
CO2: 28 mmol/L (ref 22–32)
Calcium: 9 mg/dL (ref 8.9–10.3)
Chloride: 104 mmol/L (ref 98–111)
Creatinine: 1.68 mg/dL — ABNORMAL HIGH (ref 0.61–1.24)
GFR, Estimated: 42 mL/min — ABNORMAL LOW (ref >60)
Glucose, Bld: 138 mg/dL — ABNORMAL HIGH (ref 70–99)
Potassium: 3.7 mmol/L (ref 3.5–5.1)
Sodium: 137 mmol/L (ref 135–145)
Total Bilirubin: 0.6 mg/dL (ref 0.3–1.2)
Total Protein: 7.9 g/dL (ref 6.5–8.1)

## 2021-12-28 LAB — GLUCOSE, CAPILLARY: Glucose-Capillary: 126 mg/dL — ABNORMAL HIGH (ref 70–99)

## 2021-12-28 LAB — URIC ACID: Uric Acid, Serum: 4.9 mg/dL (ref 3.7–8.6)

## 2021-12-28 LAB — SAMPLE TO BLOOD BANK

## 2021-12-28 MED ORDER — FLUDEOXYGLUCOSE F - 18 (FDG) INJECTION
11.5900 | Freq: Once | INTRAVENOUS | Status: AC | PRN
  Administered 2021-12-28: 11.59 via INTRAVENOUS

## 2021-12-29 ENCOUNTER — Other Ambulatory Visit: Payer: No Typology Code available for payment source

## 2021-12-29 ENCOUNTER — Encounter: Payer: Self-pay | Admitting: Hematology

## 2021-12-29 ENCOUNTER — Inpatient Hospital Stay: Payer: Medicare Other

## 2021-12-29 VITALS — BP 119/71 | HR 58 | Temp 98.4°F | Resp 18 | Wt 209.4 lb

## 2021-12-29 DIAGNOSIS — C8318 Mantle cell lymphoma, lymph nodes of multiple sites: Secondary | ICD-10-CM

## 2021-12-29 DIAGNOSIS — Z87891 Personal history of nicotine dependence: Secondary | ICD-10-CM | POA: Diagnosis not present

## 2021-12-29 DIAGNOSIS — E43 Unspecified severe protein-calorie malnutrition: Secondary | ICD-10-CM | POA: Diagnosis not present

## 2021-12-29 DIAGNOSIS — D6959 Other secondary thrombocytopenia: Secondary | ICD-10-CM | POA: Diagnosis not present

## 2021-12-29 DIAGNOSIS — Z5189 Encounter for other specified aftercare: Secondary | ICD-10-CM | POA: Insufficient documentation

## 2021-12-29 DIAGNOSIS — D630 Anemia in neoplastic disease: Secondary | ICD-10-CM | POA: Insufficient documentation

## 2021-12-29 DIAGNOSIS — Z7189 Other specified counseling: Secondary | ICD-10-CM

## 2021-12-29 MED ORDER — FAMOTIDINE IN NACL 20-0.9 MG/50ML-% IV SOLN
20.0000 mg | Freq: Once | INTRAVENOUS | Status: AC
  Administered 2021-12-29: 20 mg via INTRAVENOUS
  Filled 2021-12-29: qty 50

## 2021-12-29 MED ORDER — SODIUM CHLORIDE 0.9 % IV SOLN: Freq: Once | INTRAVENOUS | Status: AC

## 2021-12-29 MED ORDER — ACETAMINOPHEN 325 MG PO TABS
650.0000 mg | ORAL_TABLET | Freq: Once | ORAL | Status: AC
  Administered 2021-12-29: 650 mg via ORAL
  Filled 2021-12-29: qty 2

## 2021-12-29 MED ORDER — PALONOSETRON HCL INJECTION 0.25 MG/5ML
0.2500 mg | Freq: Once | INTRAVENOUS | Status: AC
  Administered 2021-12-29: 0.25 mg via INTRAVENOUS
  Filled 2021-12-29: qty 5

## 2021-12-29 MED ORDER — SODIUM CHLORIDE 0.9 % IV SOLN
375.0000 mg/m2 | Freq: Once | INTRAVENOUS | Status: AC
  Administered 2021-12-29: 800 mg via INTRAVENOUS
  Filled 2021-12-29: qty 50

## 2021-12-29 MED ORDER — DIPHENHYDRAMINE HCL 25 MG PO CAPS
50.0000 mg | ORAL_CAPSULE | Freq: Once | ORAL | Status: AC
  Administered 2021-12-29: 50 mg via ORAL
  Filled 2021-12-29: qty 2

## 2021-12-29 MED ORDER — SODIUM CHLORIDE 0.9 % IV SOLN
50.0000 mg/m2 | Freq: Once | INTRAVENOUS | Status: AC
  Administered 2021-12-29: 125 mg via INTRAVENOUS
  Filled 2021-12-29: qty 5

## 2021-12-29 MED ORDER — SODIUM CHLORIDE 0.9 % IV SOLN
10.0000 mg | Freq: Once | INTRAVENOUS | Status: AC
  Administered 2021-12-29: 10 mg via INTRAVENOUS
  Filled 2021-12-29: qty 10

## 2021-12-29 MED ORDER — TORSEMIDE 10 MG PO TABS: 20.0000 mg | ORAL_TABLET | Freq: Every day | ORAL | 0 refills | Status: DC

## 2021-12-29 MED FILL — Dexamethasone Sodium Phosphate Inj 100 MG/10ML: INTRAMUSCULAR | Qty: 1 | Status: AC

## 2021-12-29 NOTE — Progress Notes (Signed)
Dorsey MD made aware of new bilateral pitting edema in pt feet. Per Lorenso Courier MD, pt advised to monitor and continue with treatment today.  ? ?

## 2021-12-29 NOTE — Patient Instructions (Addendum)
[facility]  Discharge Instructions: ?Thank you for choosing [facility] to provide your oncology and hematology care.  ? ?If you have a lab appointment with the [facility], please go directly to the [facility] and check in at the registration area. ?  ?Wear comfortable clothing and clothing appropriate for easy access to any Portacath or PICC line.  ? ?We strive to give you quality time with your provider. You may need to reschedule your appointment if you arrive late (15 or more minutes).  Arriving late affects you and other patients whose appointments are after yours.  Also, if you miss three or more appointments without notifying the office, you may be dismissed from the clinic at the provider?s discretion.    ?  ?For prescription refill requests, have your pharmacy contact our office and allow 72 hours for refills to be completed.   ? ?Today you received the following chemotherapy and/or immunotherapy agents: Rituximab and Bendeka    ?  ?To help prevent nausea and vomiting after your treatment, we encourage you to take your nausea medication as directed. ? ?BELOW ARE SYMPTOMS THAT SHOULD BE REPORTED IMMEDIATELY: ?*FEVER GREATER THAN 100.4 F (38 ?C) OR HIGHER ?*CHILLS OR SWEATING ?*NAUSEA AND VOMITING THAT IS NOT CONTROLLED WITH YOUR NAUSEA MEDICATION ?*UNUSUAL SHORTNESS OF BREATH ?*UNUSUAL BRUISING OR BLEEDING ?*URINARY PROBLEMS (pain or burning when urinating, or frequent urination) ?*BOWEL PROBLEMS (unusual diarrhea, constipation, pain near the anus) ?TENDERNESS IN MOUTH AND THROAT WITH OR WITHOUT PRESENCE OF ULCERS (sore throat, sores in mouth, or a toothache) ?UNUSUAL RASH, SWELLING OR PAIN  ?UNUSUAL VAGINAL DISCHARGE OR ITCHING  ? ?Items with * indicate a potential emergency and should be followed up as soon as possible or go to the Emergency Department if any problems should occur. ? ?Please show the CHEMOTHERAPY ALERT CARD or IMMUNOTHERAPY ALERT CARD at  check-in to the Emergency Department and triage nurse. ? ?Should you have questions after your visit or need to cancel or reschedule your appointment, please contact [facility]  Dept: [phone]  and follow the prompts.  Office hours are 8:00 a.m. to 4:30 p.m. Monday - Friday. Please note that voicemails left after 4:00 p.m. may not be returned until the following business day.  We are closed weekends and major holidays. You have access to a nurse at all times for urgent questions. Please call the main number to the clinic Dept: [phone] and follow the prompts. ? ? ?For any non-urgent questions, you may also contact your provider using MyChart. We now offer e-Visits for anyone 79 and older to request care online for non-urgent symptoms. For details visit [email or web address]. ?  ?Also download the MyChart app! Go to the app store, search "MyChart", open the app, select [facility], and log in with your MyChart username and password. ? ?Due to Covid, a mask is required upon entering the hospital/clinic. If you do not have a mask, one will be given to you upon arrival. For doctor visits, patients may have 1 support person aged 50 or older with them. For treatment visits, patients cannot have anyone with them due to current Covid guidelines and our immunocompromised population.  ? ?

## 2021-12-29 NOTE — Progress Notes (Signed)
HEMATOLOGY/ONCOLOGY CLINIC NOTE  Date of Service: 12/23/2021   Patient Care Team: [facility], [place] as PCP - General (General Practice)  CHIEF COMPLAINTS/PURPOSE OF CONSULTATION:  Continued evaluation and management of mantle cell lymphoma  HISTORY OF PRESENTING ILLNESS:   Jerry Tran is a wonderful 76 y.o. male who has been referred to [place] by Dr Jerry Tran for evaluation and management of newly diagnosed mantle cell lymphoma.  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  He notes that he has had a significant loss of appetite and weight loss of about 20 to 30 pounds over the last 1 to 2 months. He recently was treated for Helicobacter pylori gastritis.  Patient had a CT renal stone protocol on 12/08/2021 for evaluation of his hematuria which showed abdominal pelvic lymphadenopathy with significant splenomegaly of 22 cm.  Thick walled bladder reflecting radiation changes.  Patient had a CTA of the chest which showed axillary mediastinal lymphadenopathy with marked enlargement of the spleen.  Negative for pulmonary embolism  Labs on presentation to the emergency room on 12/08/2021 showed anemia with a hemoglobin of 9, WBC count of 4.3k and platelets of 36k.  Patient did have a biopsy of his right supraclavicular lymph node on 12/09/2021 which later resulted and showed findings consistent with newly diagnosed mantle cell lymphoma.  Dr. Lindi Tran called me with these results and asked me to take over this patient for continued evaluation and management of his newly diagnosed mantle cell lymphoma.  In clinic today the patient notes significant fatigue.  No acute chest  pain or shortness of breath.  No acute abdominal discomfort.  Poor p.o. intake.  All lab results imaging studies and pathology results were discussed in detail with the patient.  He was accompanied by his wife for this clinic visit.  Patient is a retired [place] war veteran who does endorse agent orange exposure.  INTERVAL HISTORY  Jerry Tran is here for follow-up for continued evaluation and management of his mantle cell lymphoma.  She is feeling much better after being started on steroids.  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 He continues to have anemia and thrombocytopenia. He is here to receive his first dose of Rituxan today.  He received this without any acute issues and with appropriate supportive medications. He notes significant improvement in his appetite. He notes that he is trying to drink enough water to keep his kidneys flushed. No fevers no chills no night sweats. Overall feels more energetic. He has been scheduled for his PET scan on 12/28/2021.  Labs today 12/19/2021 were reviewed with him in detail.  MEDICAL HISTORY:  Past Medical History:  Diagnosis Date   Arthritis    Chronic kidney disease, stage 3b ([place]) 12/09/2021   Dyslipidemia    GERD (gastroesophageal reflux disease)    Hypertension    PAF (paroxysmal atrial fibrillation) (HCC)    Prostate cancer ([place]) 03/2021   s/p radiation therapy   PTSD (post-traumatic stress disorder)     SURGICAL HISTORY: Past Surgical History:  Procedure Laterality Date  LIPOMA EXCISION Right 12/09/2021   Procedure: RIGHT SUPERCLAVICULAR LYMPH NODE EXCISION;  Surgeon: Ileana Roup, MD;  Location: [facility];  Service: General;  Laterality: Right;    SOCIAL HISTORY: Social History   Socioeconomic History   Marital status: Married    Spouse name: Not on  file   Number of children: Not on file   Years of education: Not on file   Highest education level: Not on file  Occupational History   Occupation: retired  Tobacco Use   Smoking status: Former    Packs/day: 1.00    Years: 15.00    Pack years: 15.00    Types: Cigarettes   Smokeless tobacco: Never  Substance and Sexual Activity   Alcohol use: Not Currently    Comment: h/o heavy use   Drug use: Not Currently    Types: Marijuana, Cocaine    Comment: remote   Sexual activity: Not on file  Other Topics Concern   Not on file  Social History Narrative   Not on file   Social Determinants of Health   Financial Resource Strain: Not on file  Food Insecurity: Not on file  Transportation Needs: Not on file  Physical Activity: Not on file  Stress: Not on file  Social Connections: Not on file  Intimate Partner Violence: Not on file    FAMILY HISTORY: Family History  Problem Relation Age of Onset   Cancer Neg Hx     ALLERGIES:  is allergic to sildenafil.  MEDICATIONS:  Current Outpatient Medications  Medication Sig Dispense Refill   acyclovir (ZOVIRAX) 400 MG tablet Take 1 tablet (400 mg total) by mouth daily. 30 tablet 3   allopurinol (ZYLOPRIM) 100 MG tablet Take 1 tablet (100 mg total) by mouth 2 (two) times daily. 60 tablet 0   ammonium lactate (LAC-HYDRIN) 12 % lotion Apply 1 application topically 2 (two) times daily as needed for dry skin.     atorvastatin (LIPITOR) 20 MG tablet Take 10 mg by mouth at bedtime.     Cholecalciferol (VITAMIN D3 PO) Take 1 tablet by mouth every morning.     dexamethasone (DECADRON) 4 MG tablet Take 2 tablets (8 mg total) by mouth daily. Start the day after bendamustine chemotherapy for 2 days. Take with food. 30 tablet 1   diclofenac Sodium (VOLTAREN) 1 % GEL Apply 2 g topically 4 (four) times daily as needed (pain).     feeding supplement (ENSURE ENLIVE / ENSURE PLUS) LIQD Take 237 mLs by mouth 2 (two) times daily between meals. 237 mL 12    flecainide (TAMBOCOR) 100 MG tablet Take 100 mg by mouth every 12 (twelve) hours.     fluticasone (FLONASE) 50 MCG/ACT nasal spray Place 1 spray into both nostrils daily as needed for allergies or rhinitis.     hydroxypropyl methylcellulose / hypromellose (ISOPTO TEARS / GONIOVISC) 2.5 % ophthalmic solution Place 2 drops into both eyes 2 (two) times daily as needed for dry eyes.     LORazepam (ATIVAN) 0.5 MG tablet Take 1 tablet (0.5 mg total) by mouth every 6 (six) hours as needed (Nausea or vomiting). 30 tablet 0   methocarbamol (ROBAXIN) 500 MG tablet Take 500 mg by mouth every 8 (eight) hours as needed for muscle spasms.     metoprolol tartrate (LOPRESSOR) 25 MG tablet Take 12.5 mg by mouth 2 (two) times daily. Take with flecainide     omeprazole (PRILOSEC) 20 MG capsule Take 20 mg by mouth daily before breakfast.  ondansetron (ZOFRAN) 8 MG tablet Take 1 tablet (8 mg total) by mouth 2 (two) times daily as needed for refractory nausea / vomiting. Start on day 2 after bendamustine chemo. 30 tablet 1   prazosin (MINIPRESS) 2 MG capsule Take 6 mg by mouth at bedtime.     prochlorperazine (COMPAZINE) 10 MG tablet Take 1 tablet (10 mg total) by mouth every 6 (six) hours as needed (Nausea or vomiting). 30 tablet 1   terbinafine (LAMISIL) 1 % cream Apply 1 application topically 2 (two) times daily as needed (athlete's foot).     torsemide (DEMADEX) 10 MG tablet Take 2 tablets (20 mg total) by mouth daily. 15 tablet 0   triamcinolone cream (KENALOG) 0.1 % Apply 1 application topically 2 (two) times daily as needed (rash).     No current facility-administered medications for this visit.    REVIEW OF SYSTEMS:    10 Point review of Systems was done is negative except as noted above.  PHYSICAL EXAMINATION: ECOG PERFORMANCE STATUS: 2 - Symptomatic, <50% confined to bed NAD GENERAL:alert, in no acute distress. SKIN: no acute rashes, no significant lesions EYES: conjunctiva are pink and  non-injected, sclera anicteric OROPHARYNX: MMM NECK: supple, no JVD LYMPH: Palpable bilateral lower cervical and axillary lymphadenopathy  LUNGS: clear to auscultation b/l with normal respiratory effort HEART: regular rate & rhythm ABDOMEN:  normoactive bowel sounds , non tender, not distended. Extremity: Trace bilateral pedal edema PSYCH: alert & oriented x 3 with fluent speech NEURO: no focal motor/sensory deficits  LABORATORY DATA:  I have reviewed the data as listed Component     Latest Ref Rng & Units 12/22/2021  WBC     4.0 - 10.5 K/uL   RBC     4.22 - 5.81 MIL/uL   Hemoglobin     13.0 - 17.0 g/dL   HCT     39.0 - 52.0 %   MCV     80.0 - 100.0 fL   MCH     26.0 - 34.0 pg   MCHC     30.0 - 36.0 g/dL   RDW     11.5 - 15.5 %   Platelets     150 - 400 K/uL   nRBC     0.0 - 0.2 %   Neutrophils     %   NEUT#     1.7 - 7.7 K/uL   Lymphocytes     %   Lymphocyte #     0.7 - 4.0 K/uL   Monocytes Relative     %   Monocyte #     0.1 - 1.0 K/uL   Eosinophil     %   Eosinophils Absolute     0.0 - 0.5 K/uL   Basophil     %   Basophils Absolute     0.0 - 0.1 K/uL   WBC Morphology        RBC Morphology        Smear Review        Immature Granulocytes     %   Abs Immature Granulocytes     0.00 - 0.07 K/uL   Sodium     135 - 145 mmol/L   Potassium     3.5 - 5.1 mmol/L   Chloride     98 - 111 mmol/L   CO2     22 - 32 mmol/L   Glucose     70 - 99 mg/dL   BUN  8 - 23 mg/dL   Creatinine     0.61 - 1.24 mg/dL   Calcium     8.9 - 10.3 mg/dL   Total Protein     6.5 - 8.1 g/dL   Albumin     3.5 - 5.0 g/dL   AST     15 - 41 U/L   ALT     0 - 44 U/L   Alkaline Phosphatase     38 - 126 U/L   Total Bilirubin     0.3 - 1.2 mg/dL   GFR, Est Non African American     >60 mL/min   Anion gap     5 - 15   HBV DNA SERPL PCR-ACNC     IU/mL HBV DNA not detected  HBV DNA SERPL PCR-LOG IU     log10 IU/mL UNABLE TO CALCULATE  Test Info:      Comment   Uric Acid, Serum     3.7 - 8.6 mg/dL    Component     Latest Ref Rng & Units 12/23/2021  WBC     4.0 - 10.5 K/uL 3.5 (L)  RBC     4.22 - 5.81 MIL/uL 2.67 (L)  Hemoglobin     13.0 - 17.0 g/dL 7.9 (L)  HCT     39.0 - 52.0 % 25.0 (L)  MCV     80.0 - 100.0 fL 93.6  MCH     26.0 - 34.0 pg 29.6  MCHC     30.0 - 36.0 g/dL 31.6  RDW     11.5 - 15.5 % 17.1 (H)  Platelets     150 - 400 K/uL 53 (L)  nRBC     0.0 - 0.2 % 0.0  Neutrophils     % 51  NEUT#     1.7 - 7.7 K/uL 1.8  Lymphocytes     % 44  Lymphocyte #     0.7 - 4.0 K/uL 1.5  Monocytes Relative     % 4  Monocyte #     0.1 - 1.0 K/uL 0.2  Eosinophil     % 0  Eosinophils Absolute     0.0 - 0.5 K/uL 0.0  Basophil     % 1  Basophils Absolute     0.0 - 0.1 K/uL 0.0  WBC Morphology      VARIANT LYMPH PRESENT  RBC Morphology      MORPHOLOGY UNREMARKABLE  Smear Review      Normal platelet morphology  Immature Granulocytes     % 0  Abs Immature Granulocytes     0.00 - 0.07 K/uL 0.01  Sodium     135 - 145 mmol/L 138  Potassium     3.5 - 5.1 mmol/L 3.9  Chloride     98 - 111 mmol/L 106  CO2     22 - 32 mmol/L 26  Glucose     70 - 99 mg/dL 146 (H)  BUN     8 - 23 mg/dL 40 (H)  Creatinine     0.61 - 1.24 mg/dL 1.62 (H)  Calcium     8.9 - 10.3 mg/dL 9.0  Total Protein     6.5 - 8.1 g/dL 8.1  Albumin     3.5 - 5.0 g/dL 3.2 (L)  AST     15 - 41 U/L 28  ALT     0 - 44 U/L 10  Alkaline Phosphatase     38 -  126 U/L 73  Total Bilirubin     0.3 - 1.2 mg/dL 0.6  GFR, Est Non African American     >60 mL/min 44 (L)  Anion gap     5 - 15 6  HBV DNA SERPL PCR-ACNC     IU/mL   HBV DNA SERPL PCR-LOG IU     log10 IU/mL   Test Info:        Uric Acid, Serum     3.7 - 8.6 mg/dL 4.3   Surgical Pathology  CASE: [id number]  PATIENT: Jerry Tran  Flow Pathology Report      Clinical history: Mantle Cell Lymphoma of lymph nodes of multiple  regions      DIAGNOSIS:   -Monoclonal B-cell  population identified  -See comment   COMMENT:   The findings are consistent with involvement by previously known mantle  cell lymphoma.  RADIOGRAPHIC STUDIES: I have personally reviewed the radiological images as listed and agreed with the findings in the report. DG Chest 2 View  Result Date: 12/14/2021 CLINICAL DATA:  Short of breath. EXAM: CHEST - 2 VIEW COMPARISON:  Chest x-ray 12/08/2021.  Chest CT 12/09/2021 FINDINGS: Linear horizontal densities in the left lung base and lingula appear unchanged. No new infiltrate or effusion. Negative for heart failure or edema. IMPRESSION: Persistent linear densities in the lingula and left lung base likely due to scarring or atelectasis. Electronically Signed   By: Franchot Gallo M.D.   On: 12/14/2021 11:52   DG Chest 2 View  Result Date: 12/08/2021 CLINICAL DATA:  Shortness of breath EXAM: CHEST - 2 VIEW COMPARISON:  None. FINDINGS: Borderline heart size with normal pulmonary vascularity. Linear opacities in the mid and lower lungs likely representing fibrosis or linear atelectasis. No focal consolidation. No pleural effusions. No pneumothorax. Mediastinal contours appear intact. Degenerative changes in the spine. IMPRESSION: Linear atelectasis or fibrosis in the mid and lower lungs. Borderline heart size. No evidence of active pulmonary disease. Electronically Signed   By: Lucienne Capers M.D.   On: 12/08/2021 22:58   CT Angio Chest PE W and/or Wo Contrast  Result Date: 12/09/2021 CLINICAL DATA:  High probability for pulmonary embolism EXAM: CT ANGIOGRAPHY CHEST WITH CONTRAST TECHNIQUE: Multidetector CT imaging of the chest was performed using the standard protocol during bolus administration of intravenous contrast. Multiplanar CT image reconstructions and MIPs were obtained to evaluate the vascular anatomy. RADIATION DOSE REDUCTION: This exam was performed according to the departmental dose-optimization program which includes automated exposure  control, adjustment of the mA and/or kV according to patient size and/or use of iterative reconstruction technique. CONTRAST:  55mL OMNIPAQUE IOHEXOL 350 MG/ML SOLN COMPARISON:  None. FINDINGS: Cardiovascular: Suboptimal but satisfactory opacification of the pulmonary arteries to the segmental level when accounting for intermittent motion and streak artifact. No evidence of pulmonary embolism. Enlarged heart size. No pericardial effusion. Mediastinum/Nodes: Bilateral axillary lymphadenopathy with homogeneous nodal density. Nodes measure up to 2.2 cm bilaterally. There is also intrathoracic adenopathy scattered throughout the mediastinum and affecting bilateral hila. Left anterior mediastinal or IMA chain node measures up to 2.2 cm long axis. Partial coverage of submental adenopathy. Lungs/Pleura: Patchy opacity in the dependent lungs likely reflecting atelectasis. No focal pneumonia or evidence of edema. No effusion or air leak. There is right lower lobe paramediastinal bandlike opacity which is likely scarring and sided by vertebral osteophytes Upper Abdomen: Partial coverage of the spleen shows marked enlargement, measuring at least 18 x 11 cm. Musculoskeletal: Thoracic spondylosis and degeneration which  is generalized. No acute or destructive process. Review of the MIP images confirms the above findings. IMPRESSION: 1. Axillary and mediastinal adenopathy with marked enlargement of the partially covered spleen, lymphoma is the leading concern. 2. Negative for pulmonary embolism. Electronically Signed   By: Jorje Guild M.D.   On: 12/09/2021 05:33   NM PET Image Initial (PI) Whole Body  Result Date: 12/29/2021 CLINICAL DATA:  Initial treatment strategy for mantle cell lymphoma. EXAM: NUCLEAR MEDICINE PET WHOLE BODY TECHNIQUE: 11.59 mCi F-18 FDG was injected intravenously. Full-ring PET imaging was performed from the head to foot after the radiotracer. CT data was obtained and used for attenuation correction  and anatomic localization. Fasting blood glucose:  mg/dl COMPARISON:  None. FINDINGS: Mediastinal blood pool activity: SUV max 2.3 HEAD/NECK: Bilateral mildly enlarged and hypermetabolic cervical lymph nodes. Reference right level 2A lymph node measuring 1.4 cm in short axis on series 4, image 57 with SUV max of 3.6. Reference level 1A submental lymph node measuring 1.2 cm in short axis with an SUV max of 3.8. Numerous right greater than left enlarged and hypermetabolic supraclavicular lymph nodes. Reference right supraclavicular lymph node located on series 4, image 67 measures 2.4 cm in short axis with an SUV max of 5.7. Reference left supraclavicular lymph node measuring 1.2 cm in short axis on series 4, image 60 with an SUV max of 4.0. Incidental CT findings: Increased tonsilla soft tissue, right greater than left, concerning for lymphomatous involvement, for example, series 601, image 50. CHEST: Mildly enlarged and hypermetabolic bilateral axillary lymph nodes. Reference left axillary lymph node measuring 1.2 cm in short axis on series 4, image 99 with an SUV max of 3.2. Prominent subcentimeter prevascular lymph nodes with FDG uptake less than mediastinal blood pool. Left IMA lymph node measuring 9 mm in short axis on series 4, image 98 with an SUV max of 2.4, slightly above mediastinal blood pool. Incidental CT findings: none ABDOMEN/PELVIS: Enlarged and hypermetabolic retroperitoneal and pelvic lymph nodes. Reference conglomerate of enlarged right periaortic lymph node measuring 2.4 cm in short axis on image 149 with an SUV max of 2.7. Reference left pelvic sidewall lymph node measuring 3.0 cm in short axis on image 184 with an SUV max of 4.1. Incidental CT findings: Splenomegaly, measuring 21.0 cm in craniocaudal dimension. Fiducial markers noted in the prostate. Mild diverticulosis. SKELETON: Focal hypermetabolic uptake seen at the head of the right clavicle with no definite underlying osseous lesion, SUV  max 4.4. Incidental CT findings: none EXTREMITIES: Bilateral enlarged hypermetabolic inguinal lymph nodes that extend inferiorly to the upper thighs. Reference right inguinal lymph node measuring 1.6 cm in short axis on image 110 with an SUV max of 4.1. Reference right inguinal lymph node measuring 1.8 cm in short axis on image 234 with an SUV max of 2.3. Incidental CT findings: none IMPRESSION: 1. Enlarged hypermetabolic cervical, bilateral axillary, retroperitoneal, pelvic, and inguinal lymph nodes. [place] 4. 2. Subcentimeter mediastinal lymph nodes with uptake similar to mediastinal blood pool. 3. Focal hypermetabolic uptake seen at the head of the right clavicle, concerning for osseous involvement. 4. Splenomegaly. 5. Increased tonsillar soft tissue, right greater than left, concerning for lymphomatous involvement. Correlate with direct visual inspection and/or contrast-enhanced neck CT. Electronically Signed   By: Yetta Glassman M.D.   On: 12/29/2021 16:02   ECHOCARDIOGRAM COMPLETE  Result Date: 12/10/2021    ECHOCARDIOGRAM REPORT   Patient Name:   Jerry Tran Date of Exam: 12/10/2021 Medical Rec #:  [id number]  Height:       76.0 in Accession #:    [id number]   Weight:       213.2 lb Date of Birth:  Nov 12, 1945    BSA:          2.276 m Patient Age:    50 years     BP:           109/71 mmHg Patient Gender: M            HR:           71 bpm. Exam Location:  Inpatient Procedure: 2D Echo, 3D Echo, Cardiac Doppler and Color Doppler Indications:    R06.02 SOB  History:        Patient has no prior history of Echocardiogram examinations.                 Abnormal ECG, Arrythmias:Atrial Fibrillation,                 Signs/Symptoms:Dyspnea; Risk Factors:Hypertension, Dyslipidemia                 and Former Smoker. Cancer.  Sonographer:    Roseanna Rainbow RDCS Referring Phys: [id number] BELKYS A REGALADO IMPRESSIONS  1. Left ventricular ejection fraction, by estimation, is 55 to 60%. The left ventricle has normal function.  The left ventricle has no regional wall motion abnormalities. Left ventricular diastolic parameters are consistent with Grade I diastolic dysfunction (impaired relaxation).  2. Right ventricular systolic function is normal. The right ventricular size is mildly enlarged. There is moderately elevated pulmonary artery systolic pressure. The estimated right ventricular systolic pressure is 62.7 mmHg.  3. The mitral valve is grossly normal. Trivial mitral valve regurgitation.  4. The aortic valve is tricuspid. There is mild calcification of the aortic valve. There is mild thickening of the aortic valve. Aortic valve regurgitation is trivial. Aortic valve sclerosis/calcification is present, without any evidence of aortic stenosis.  5. Aortic dilatation noted. There is mild dilatation of the ascending aorta, measuring 37 mm.  6. The inferior vena cava is dilated in size with <50% respiratory variability, suggesting right atrial pressure of 15 mmHg.  7. Frequent PVCs during study Comparison(s): No prior Echocardiogram. FINDINGS  Left Ventricle: Left ventricular ejection fraction, by estimation, is 55 to 60%. The left ventricle has normal function. The left ventricle has no regional wall motion abnormalities. The left ventricular internal cavity size was normal in size. There is  no left ventricular hypertrophy. Left ventricular diastolic parameters are consistent with Grade I diastolic dysfunction (impaired relaxation). Right Ventricle: The right ventricular size is mildly enlarged. No increase in right ventricular wall thickness. Right ventricular systolic function is normal. There is moderately elevated pulmonary artery systolic pressure. The tricuspid regurgitant velocity is 2.77 m/s, and with an assumed right atrial pressure of 15 mmHg, the estimated right ventricular systolic pressure is 03.5 mmHg. Left Atrium: Left atrial size was normal in size. Right Atrium: Right atrial size was normal in size. Pericardium: There  is no evidence of pericardial effusion. Mitral Valve: The mitral valve is grossly normal. There is mild thickening of the mitral valve leaflet(s). There is mild calcification of the mitral valve leaflet(s). Mild to moderate mitral annular calcification. Trivial mitral valve regurgitation. Tricuspid Valve: The tricuspid valve is normal in structure. Tricuspid valve regurgitation is mild. Aortic Valve: The aortic valve is tricuspid. There is mild calcification of the aortic valve. There is mild thickening of the aortic valve. Aortic valve regurgitation is trivial.  Aortic valve sclerosis/calcification is present, without any evidence of aortic stenosis. Pulmonic Valve: The pulmonic valve was normal in structure. Pulmonic valve regurgitation is trivial. Aorta: Aortic dilatation noted. There is mild dilatation of the ascending aorta, measuring 37 mm. Venous: The inferior vena cava is dilated in size with less than 50% respiratory variability, suggesting right atrial pressure of 15 mmHg. IAS/Shunts: The atrial septum is grossly normal.  LEFT VENTRICLE PLAX 2D LVIDd:         5.60 cm      Diastology LVIDs:         3.50 cm      LV e' medial:    8.49 cm/s LV PW:         1.00 cm      LV E/e' medial:  8.5 LV IVS:        0.90 cm      LV e' lateral:   8.98 cm/s LVOT diam:     2.10 cm      LV E/e' lateral: 8.0 LV SV:         70 LV SV Index:   31 LVOT Area:     3.46 cm  LV Volumes (MOD) LV vol d, MOD A2C: 158.0 ml LV vol d, MOD A4C: 144.0 ml LV vol s, MOD A2C: 66.7 ml LV vol s, MOD A4C: 69.3 ml LV SV MOD A2C:     91.3 ml LV SV MOD A4C:     144.0 ml LV SV MOD BP:      84.2 ml RIGHT VENTRICLE             IVC RV S prime:     13.20 cm/s  IVC diam: 2.30 cm RVOT diam:      2.30 cm TAPSE (M-mode): 2.0 cm LEFT ATRIUM           Index        RIGHT ATRIUM           Index LA diam:      4.20 cm 1.85 cm/m   RA Area:     18.80 cm LA Vol (A2C): 18.1 ml 7.95 ml/m   RA Volume:   44.00 ml  19.33 ml/m LA Vol (A4C): 61.8 ml 27.15 ml/m  AORTIC  VALVE             PULMONIC VALVE LVOT Vmax:   100.00 cm/s PR End Diast Vel: 2.13 msec LVOT Vmean:  66.000 cm/s LVOT VTI:    0.201 m  AORTA Ao Root diam: 3.40 cm Ao Asc diam:  3.70 cm MITRAL VALVE               TRICUSPID VALVE MV Area (PHT): 4.12 cm    TR Peak grad:   30.7 mmHg MV Decel Time: 184 msec    TR Vmax:        277.00 cm/s MV E velocity: 71.82 cm/s MV A velocity: 74.95 cm/s  SHUNTS MV E/A ratio:  0.96        Systemic VTI:  0.20 m                            Systemic Diam: 2.10 cm                            Pulmonic Diam: 2.30 cm Gwyndolyn Kaufman MD Electronically signed by Gwyndolyn Kaufman MD Signature Date/Time: 12/10/2021/5:22:38 PM  Final    CT Renal Stone Study  Result Date: 12/08/2021 CLINICAL DATA:  Hematuria EXAM: CT ABDOMEN AND PELVIS WITHOUT CONTRAST TECHNIQUE: Multidetector CT imaging of the abdomen and pelvis was performed following the standard protocol without IV contrast. RADIATION DOSE REDUCTION: This exam was performed according to the departmental dose-optimization program which includes automated exposure control, adjustment of the mA and/or kV according to patient size and/or use of iterative reconstruction technique. COMPARISON:  None. FINDINGS: Lower chest: Trace left pleural effusion. Hypodense blood pool relative to myocardium, suggesting anemia. Hepatobiliary: Unenhanced liver is unremarkable. Gallbladder is unremarkable. No intrahepatic or extrahepatic ductal dilatation. Pancreas: Within normal limits. Spleen: Enlarged, measuring 22 in maximal craniocaudal dimension. Adrenals/Urinary Tract: Adrenal glands are within normal limits. Kidneys are within normal limits. No renal calculi or hydronephrosis. Thick-walled bladder, although underdistended. Stomach/Bowel: Stomach is notable for mild concentric wall thickening of the gastric antrum (series 3/image 32), although poorly evaluated on CT. No evidence of bowel obstruction. Normal appendix (series 3/image 56). Scattered colonic  diverticulosis, without evidence of diverticulitis. Vascular/Lymphatic: No evidence of abdominal aortic aneurysm. Abdominopelvic lymphadenopathy, poorly evaluated on unenhanced CT, including: --2.8 cm short axis node in the porta hepatis (series 3/image 30) --2.8 cm short axis right para-aortic node (series 3/image 46) --3.2 cm short axis left external iliac node (series 3/image 63) --2.8 cm short axis right external iliac node (series 3/image 72) --2.0 cm short axis left inguinal node (series 3/image 88) --1.8 cm short axis right inguinal node (series 3/image 93) Reproductive: Prostate is notable for fiducial markers. Associated presacral soft tissue thickening likely reflects radiation changes. Other: Small volume pelvic ascites. Musculoskeletal: Degenerative changes of the visualized thoracolumbar spine. No focal osseous lesions. IMPRESSION: Abdominopelvic lymphadenopathy with splenomegaly, suggesting lymphoma. Fiducial markers along the prostate, suggesting radiation treatment for prostate cancer. Technically speaking, at least some of the lymphadenopathy could also be related to this diagnosis, although this would not account for splenomegaly. Thick-walled bladder, possibly reflecting radiation changes. Trace left pleural effusion. Electronically Signed   By: Julian Hy M.D.   On: 12/08/2021 23:58   VAS [place] LOWER EXTREMITY VENOUS (DVT) (7a-7p)  Result Date: 12/14/2021  Lower Venous DVT Study Patient Name:  Jerry Tran  Date of Exam:   12/14/2021 Medical Rec #: [id number]     Accession #:    [id number] Date of Birth: 07-May-1946     Patient Gender: M Patient Age:   30 years Exam Location:  [facility] Procedure:      VAS [place] LOWER EXTREMITY VENOUS (DVT) Referring Phys: Domenic Moras --------------------------------------------------------------------------------  Indications: Left lower extremity pitting edema and pain x "months" per patient.  Limitations: Poor ultrasound/tissue interface. Comparison  Study: No prior study Performing Technologist: Maudry Mayhew MHA, RDMS, RVT, RDCS  Examination Guidelines: A complete evaluation includes B-mode imaging, spectral Doppler, color Doppler, and power Doppler as needed of all accessible portions of each vessel. Bilateral testing is considered an integral part of a complete examination. Limited examinations for reoccurring indications may be performed as noted. The reflux portion of the exam is performed with the patient in reverse Trendelenburg.  +-----+---------------+---------+-----------+----------+--------------+  RIGHT Compressibility Phasicity Spontaneity Properties Thrombus Aging  +-----+---------------+---------+-----------+----------+--------------+  CFV   Full            Yes       Yes                                    +-----+---------------+---------+-----------+----------+--------------+   +---------+---------------+---------+-----------+----------+--------------+  LEFT      Compressibility Phasicity Spontaneity Properties Thrombus Aging  +---------+---------------+---------+-----------+----------+--------------+  CFV       Full            Yes       Yes                                    +---------+---------------+---------+-----------+----------+--------------+  SFJ       Full                                                             +---------+---------------+---------+-----------+----------+--------------+  FV Prox   Full                                                             +---------+---------------+---------+-----------+----------+--------------+  FV Mid    Full                                                             +---------+---------------+---------+-----------+----------+--------------+  FV Distal Full                                                             +---------+---------------+---------+-----------+----------+--------------+  PFV       Full                                                              +---------+---------------+---------+-----------+----------+--------------+  POP       Full            Yes       Yes                                    +---------+---------------+---------+-----------+----------+--------------+  PTV       Full                                                             +---------+---------------+---------+-----------+----------+--------------+  PERO      Full                                                             +---------+---------------+---------+-----------+----------+--------------+  Summary: RIGHT: - No evidence of common femoral vein obstruction.  LEFT: - There is no evidence of deep vein thrombosis in the lower extremity.  - No cystic structure found in the popliteal fossa. - Ultrasound characteristics of enlarged lymph nodes noted in the groin.  *See table(s) above for measurements and observations. Electronically signed by Harold Barban MD on 12/14/2021 at 7:39:15 PM.    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  3) moderate to severe protein calorie malnutrition  4) acute renal injury-likely due poor p.o. intake.  Due to spontaneous tumor lysis.  Also could be due to his hematuria and some element of urinary obstruction.  5) massive splenomegaly due to mantle cell lymphoma.  PLAN -Labs done today reviewed -Patient with significant anemia  that needs to be monitored for transfusion needs as well as significant thrombocytopenia which will need to be monitored closely especially after chemotherapy use Uric acid levels improved with IV fluids allopurinol and rasburicase. Patient remains high risk for tumor lysis syndrome and so we will get rasburicase with Rituxan today as premedication for TLS prophylaxis in addition to other supportive medications. Patient appropriate to start Rituxan today. He will continue his prednisone till he is able to start Bendamustine Rituxan in about a week Continue allopurinol 100 mg p.o. twice daily  Hepatitis B core antibody was positive.  Hepatitis B DNA PCR negative. -He was recommended to drink at least 2 L of water daily to keep his kidneys flushed. -Outpatient PET CT scan scheduled for 12/28/2021 -Patient will let us know if he prefers to have a port placed -Rituxan orders reviewed and signed.   No orders of the defined types were placed in this encounter.   Follow-up 1 unit of PRBC in 2 to 3 days Labs and clinic visit with Dr. Irene Limbo midweek next week Plan for Bendamustine Rituxan as scheduled next week.  The total time spent in the appointment was 30 minutes*.  All of the patient's questions were answered with apparent satisfaction. The patient knows to call the clinic with any problems, questions or concerns.   Sullivan Lone MD MS AAHIVMS [facility] Hematology/Oncology Physician [facility]  .*Total Encounter Time as defined by the Centers for Medicare and Medicaid Services includes, in addition to the face-to-face time of a patient visit (documented in the note above) non-face-to-face time: obtaining and reviewing outside history, ordering and reviewing medications, tests or procedures, care coordination (communications with other health care professionals or caregivers) and documentation in the medical record.

## 2021-12-29 NOTE — Progress Notes (Signed)
Per Lorenso Courier MD, ok to treat with PLT 58 and SCR 1.68. ?

## 2021-12-30 ENCOUNTER — Other Ambulatory Visit: Payer: Self-pay

## 2021-12-30 ENCOUNTER — Inpatient Hospital Stay: Payer: Medicare Other

## 2021-12-30 VITALS — BP 94/62 | HR 67 | Temp 97.2°F | Resp 18

## 2021-12-30 DIAGNOSIS — C8318 Mantle cell lymphoma, lymph nodes of multiple sites: Secondary | ICD-10-CM

## 2021-12-30 DIAGNOSIS — Z7189 Other specified counseling: Secondary | ICD-10-CM

## 2021-12-30 MED ORDER — SODIUM CHLORIDE 0.9 % IV SOLN: Freq: Once | INTRAVENOUS | Status: AC

## 2021-12-30 MED ORDER — SODIUM CHLORIDE 0.9 % IV SOLN
10.0000 mg | Freq: Once | INTRAVENOUS | Status: AC
  Administered 2021-12-30: 10 mg via INTRAVENOUS
  Filled 2021-12-30: qty 10

## 2021-12-30 MED ORDER — SODIUM CHLORIDE 0.9 % IV SOLN
50.0000 mg/m2 | Freq: Once | INTRAVENOUS | Status: AC
  Administered 2021-12-30: 125 mg via INTRAVENOUS
  Filled 2021-12-30: qty 5

## 2022-01-03 ENCOUNTER — Other Ambulatory Visit: Payer: Self-pay

## 2022-01-03 ENCOUNTER — Encounter: Payer: Self-pay | Admitting: Hematology

## 2022-01-03 DIAGNOSIS — C8318 Mantle cell lymphoma, lymph nodes of multiple sites: Secondary | ICD-10-CM

## 2022-01-03 NOTE — Progress Notes (Signed)
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 He notes he is feeling much better with improved appetite improved strength.  He does note grade 2 lower extremity edema likely from the steroids. Tolerated his first dose of Rituxan without any significant toxicities.  No hypersensitivity reactions. He is scheduled for his first cycle of Bendamustine Rituxan on 12/29/2021.  Labs done today reviewed with the patient.. CBC shows hemoglobin of 8.3, WBC count of 3.1k platelets of 58k  CMP shows creatinine of 1.68. otherwise unremarkable Uric acid 4.9   MEDICAL HISTORY:  Past Medical History:  Diagnosis Date   Arthritis    Chronic kidney disease, stage 3b ([place]) 12/09/2021   Dyslipidemia    GERD (gastroesophageal reflux disease)    Hypertension    PAF (paroxysmal atrial fibrillation) (HCC)    Prostate cancer ([place]) 03/2021   s/p radiation therapy   PTSD (post-traumatic stress disorder)     SURGICAL HISTORY: Past Surgical History:  Procedure Laterality Date   LIPOMA EXCISION Right 12/09/2021   Procedure: RIGHT SUPERCLAVICULAR LYMPH NODE EXCISION;  Surgeon: Ileana Roup, MD;  Location: [facility] OR;  Service: General;  Laterality: Right;    SOCIAL HISTORY: Social History   Socioeconomic History   Marital status: Married    Spouse name: Not on file   Number of children: Not on file   Years of education: Not on file   Highest education level: Not on file  Occupational History   Occupation: retired  Tobacco Use   Smoking status: Former    Packs/day: 1.00    Years:  15.00    Pack years: 15.00    Types: Cigarettes   Smokeless tobacco: Never  Substance and Sexual Activity   Alcohol use: Not Currently    Comment: h/o heavy use   Drug use: Not Currently    Types: Marijuana, Cocaine    Comment: remote   Sexual activity: Not on file  Other Topics Concern   Not on file  Social History Narrative   Not on file   Social Determinants of Health   Financial Resource Strain: Not on file  Food Insecurity: Not on file  Transportation Needs: Not on file  Physical Activity: Not on file  Stress: Not on file  Social Connections: Not on file  Intimate Partner Violence: Not on file    FAMILY HISTORY: Family History  Problem Relation Age of Onset   Cancer Neg Hx     ALLERGIES:  is allergic to sildenafil.  MEDICATIONS:  Current Outpatient Medications  Medication Sig Dispense Refill   acyclovir (ZOVIRAX) 400 MG tablet Take 1 tablet (400 mg total) by mouth daily. 30 tablet 3   allopurinol (ZYLOPRIM) 100 MG tablet Take 1 tablet (100 mg total) by mouth 2 (two) times daily. 60 tablet 0   ammonium lactate (LAC-HYDRIN) 12 % lotion Apply 1 application topically 2 (two) times daily as needed for dry skin.     atorvastatin (LIPITOR) 20 MG tablet Take 10 mg by mouth at bedtime.     Cholecalciferol (VITAMIN D3 PO) Take 1 tablet by mouth every morning.     dexamethasone (DECADRON)  4 MG tablet Take 2 tablets (8 mg total) by mouth daily. Start the day after bendamustine chemotherapy for 2 days. Take with food. 30 tablet 1   diclofenac Sodium (VOLTAREN) 1 % GEL Apply 2 g topically 4 (four) times daily as needed (pain).     feeding supplement (ENSURE ENLIVE / ENSURE PLUS) LIQD Take 237 mLs by mouth 2 (two) times daily between meals. 237 mL 12   flecainide (TAMBOCOR) 100 MG tablet Take 100 mg by mouth every 12 (twelve) hours.     fluticasone (FLONASE) 50 MCG/ACT nasal spray Place 1 spray into both nostrils daily as needed for allergies or rhinitis.     hydroxypropyl  methylcellulose / hypromellose (ISOPTO TEARS / GONIOVISC) 2.5 % ophthalmic solution Place 2 drops into both eyes 2 (two) times daily as needed for dry eyes.     LORazepam (ATIVAN) 0.5 MG tablet Take 1 tablet (0.5 mg total) by mouth every 6 (six) hours as needed (Nausea or vomiting). 30 tablet 0   methocarbamol (ROBAXIN) 500 MG tablet Take 500 mg by mouth every 8 (eight) hours as needed for muscle spasms.     metoprolol tartrate (LOPRESSOR) 25 MG tablet Take 12.5 mg by mouth 2 (two) times daily. Take with flecainide     omeprazole (PRILOSEC) 20 MG capsule Take 20 mg by mouth daily before breakfast.     ondansetron (ZOFRAN) 8 MG tablet Take 1 tablet (8 mg total) by mouth 2 (two) times daily as needed for refractory nausea / vomiting. Start on day 2 after bendamustine chemo. 30 tablet 1   prazosin (MINIPRESS) 2 MG capsule Take 6 mg by mouth at bedtime.     prochlorperazine (COMPAZINE) 10 MG tablet Take 1 tablet (10 mg total) by mouth every 6 (six) hours as needed (Nausea or vomiting). 30 tablet 1   terbinafine (LAMISIL) 1 % cream Apply 1 application topically 2 (two) times daily as needed (athlete's foot).     torsemide (DEMADEX) 10 MG tablet Take 2 tablets (20 mg total) by mouth daily. 15 tablet 0   triamcinolone cream (KENALOG) 0.1 % Apply 1 application topically 2 (two) times daily as needed (rash).     No current facility-administered medications for this visit.    REVIEW OF SYSTEMS:    10 Point review of Systems was done is negative except as noted above.  PHYSICAL EXAMINATION: ECOG PERFORMANCE STATUS: 2 - Symptomatic, <50% confined to bed NAD GENERAL:alert, in no acute distress and comfortable SKIN: no acute rashes, no significant lesions EYES: conjunctiva are pink and non-injected, sclera anicteric OROPHARYNX: MMM, no exudates, no oropharyngeal erythema or ulceration NECK: supple, no JVD LYMPH:  no palpable lymphadenopathy in the cervical, axillary or inguinal regions LUNGS: clear to  auscultation b/l with normal respiratory effort HEART: regular rate & rhythm ABDOMEN:  normoactive bowel sounds , non tender, not distended. Extremity: 2+ bilateral pedal edema PSYCH: alert & oriented x 3 with fluent speech NEURO: no focal motor/sensory deficits   LABORATORY DATA:  I have reviewed the data as listed  . CBC Latest Ref Rng & Units 12/28/2021 12/26/2021 12/23/2021  WBC 4.0 - 10.5 K/uL 3.1(L) 3.2(L) 3.5(L)  Hemoglobin 13.0 - 17.0 g/dL 8.3(L) 8.2(L) 7.9(L)  Hematocrit 39.0 - 52.0 % 26.1(L) 25.3(L) 25.0(L)  Platelets 150 - 400 K/uL 58(L) 50(L) 53(L)   CMP Latest Ref Rng & Units 12/28/2021 12/26/2021 12/23/2021  Glucose 70 - 99 mg/dL 138(H) 146(H) 146(H)  BUN 8 - 23 mg/dL 48(H) 51(H) 40(H)  Creatinine 0.61 -  1.24 mg/dL 1.68(H) 1.66(H) 1.62(H)  Sodium 135 - 145 mmol/L 137 135 138  Potassium 3.5 - 5.1 mmol/L 3.7 3.6 3.9  Chloride 98 - 111 mmol/L 104 100 106  CO2 22 - 32 mmol/L '28 29 26  '$ Calcium 8.9 - 10.3 mg/dL 9.0 9.7 9.0  Total Protein 6.5 - 8.1 g/dL 7.9 8.1 8.1  Total Bilirubin 0.3 - 1.2 mg/dL 0.6 0.6 0.6  Alkaline Phos 38 - 126 U/L 78 81 73  AST 15 - 41 U/L '30 30 28  '$ ALT 0 - 44 U/L '19 14 10   '$ Uric acid 4.9 Surgical Pathology  CASE: [id number]  PATIENT: Luka Clink  Flow Pathology Report      Clinical history: Mantle Cell Lymphoma of lymph nodes of multiple  regions      DIAGNOSIS:   -Monoclonal B-cell population identified  -See comment   COMMENT:   The findings are consistent with involvement by previously known mantle  cell lymphoma.    RADIOGRAPHIC STUDIES: I have personally reviewed the radiological images as listed and agreed with the findings in the report. DG Chest 2 View  Result Date: 12/14/2021 CLINICAL DATA:  Short of breath. EXAM: CHEST - 2 VIEW COMPARISON:  Chest x-ray 12/08/2021.  Chest CT 12/09/2021 FINDINGS: Linear horizontal densities in the left lung base and lingula appear unchanged. No new infiltrate or effusion. Negative for  heart failure or edema. IMPRESSION: Persistent linear densities in the lingula and left lung base likely due to scarring or atelectasis. Electronically Signed   By: Franchot Gallo M.D.   On: 12/14/2021 11:52   DG Chest 2 View  Result Date: 12/08/2021 CLINICAL DATA:  Shortness of breath EXAM: CHEST - 2 VIEW COMPARISON:  None. FINDINGS: Borderline heart size with normal pulmonary vascularity. Linear opacities in the mid and lower lungs likely representing fibrosis or linear atelectasis. No focal consolidation. No pleural effusions. No pneumothorax. Mediastinal contours appear intact. Degenerative changes in the spine. IMPRESSION: Linear atelectasis or fibrosis in the mid and lower lungs. Borderline heart size. No evidence of active pulmonary disease. Electronically Signed   By: Lucienne Capers M.D.   On: 12/08/2021 22:58   CT Angio Chest PE W and/or Wo Contrast  Result Date: 12/09/2021 CLINICAL DATA:  High probability for pulmonary embolism EXAM: CT ANGIOGRAPHY CHEST WITH CONTRAST TECHNIQUE: Multidetector CT imaging of the chest was performed using the standard protocol during bolus administration of intravenous contrast. Multiplanar CT image reconstructions and MIPs were obtained to evaluate the vascular anatomy. RADIATION DOSE REDUCTION: This exam was performed according to the departmental dose-optimization program which includes automated exposure control, adjustment of the mA and/or kV according to patient size and/or use of iterative reconstruction technique. CONTRAST:  79m OMNIPAQUE IOHEXOL 350 MG/ML SOLN COMPARISON:  None. FINDINGS: Cardiovascular: Suboptimal but satisfactory opacification of the pulmonary arteries to the segmental level when accounting for intermittent motion and streak artifact. No evidence of pulmonary embolism. Enlarged heart size. No pericardial effusion. Mediastinum/Nodes: Bilateral axillary lymphadenopathy with homogeneous nodal density. Nodes measure up to 2.2 cm bilaterally.  There is also intrathoracic adenopathy scattered throughout the mediastinum and affecting bilateral hila. Left anterior mediastinal or IMA chain node measures up to 2.2 cm long axis. Partial coverage of submental adenopathy. Lungs/Pleura: Patchy opacity in the dependent lungs likely reflecting atelectasis. No focal pneumonia or evidence of edema. No effusion or air leak. There is right lower lobe paramediastinal bandlike opacity which is likely scarring and sided by vertebral osteophytes Upper Abdomen: Partial coverage of the  spleen shows marked enlargement, measuring at least 18 x 11 cm. Musculoskeletal: Thoracic spondylosis and degeneration which is generalized. No acute or destructive process. Review of the MIP images confirms the above findings. IMPRESSION: 1. Axillary and mediastinal adenopathy with marked enlargement of the partially covered spleen, lymphoma is the leading concern. 2. Negative for pulmonary embolism. Electronically Signed   By: Jorje Guild M.D.   On: 12/09/2021 05:33   NM PET Image Initial (PI) Whole Body  Result Date: 12/29/2021 CLINICAL DATA:  Initial treatment strategy for mantle cell lymphoma. EXAM: NUCLEAR MEDICINE PET WHOLE BODY TECHNIQUE: 11.59 mCi F-18 FDG was injected intravenously. Full-ring PET imaging was performed from the head to foot after the radiotracer. CT data was obtained and used for attenuation correction and anatomic localization. Fasting blood glucose:  mg/dl COMPARISON:  None. FINDINGS: Mediastinal blood pool activity: SUV max 2.3 HEAD/NECK: Bilateral mildly enlarged and hypermetabolic cervical lymph nodes. Reference right level 2A lymph node measuring 1.4 cm in short axis on series 4, image 57 with SUV max of 3.6. Reference level 1A submental lymph node measuring 1.2 cm in short axis with an SUV max of 3.8. Numerous right greater than left enlarged and hypermetabolic supraclavicular lymph nodes. Reference right supraclavicular lymph node located on series 4,  image 67 measures 2.4 cm in short axis with an SUV max of 5.7. Reference left supraclavicular lymph node measuring 1.2 cm in short axis on series 4, image 60 with an SUV max of 4.0. Incidental CT findings: Increased tonsilla soft tissue, right greater than left, concerning for lymphomatous involvement, for example, series 601, image 50. CHEST: Mildly enlarged and hypermetabolic bilateral axillary lymph nodes. Reference left axillary lymph node measuring 1.2 cm in short axis on series 4, image 99 with an SUV max of 3.2. Prominent subcentimeter prevascular lymph nodes with FDG uptake less than mediastinal blood pool. Left IMA lymph node measuring 9 mm in short axis on series 4, image 98 with an SUV max of 2.4, slightly above mediastinal blood pool. Incidental CT findings: none ABDOMEN/PELVIS: Enlarged and hypermetabolic retroperitoneal and pelvic lymph nodes. Reference conglomerate of enlarged right periaortic lymph node measuring 2.4 cm in short axis on image 149 with an SUV max of 2.7. Reference left pelvic sidewall lymph node measuring 3.0 cm in short axis on image 184 with an SUV max of 4.1. Incidental CT findings: Splenomegaly, measuring 21.0 cm in craniocaudal dimension. Fiducial markers noted in the prostate. Mild diverticulosis. SKELETON: Focal hypermetabolic uptake seen at the head of the right clavicle with no definite underlying osseous lesion, SUV max 4.4. Incidental CT findings: none EXTREMITIES: Bilateral enlarged hypermetabolic inguinal lymph nodes that extend inferiorly to the upper thighs. Reference right inguinal lymph node measuring 1.6 cm in short axis on image 110 with an SUV max of 4.1. Reference right inguinal lymph node measuring 1.8 cm in short axis on image 234 with an SUV max of 2.3. Incidental CT findings: none IMPRESSION: 1. Enlarged hypermetabolic cervical, bilateral axillary, retroperitoneal, pelvic, and inguinal lymph nodes. [place] 4. 2. Subcentimeter mediastinal lymph nodes with  uptake similar to mediastinal blood pool. 3. Focal hypermetabolic uptake seen at the head of the right clavicle, concerning for osseous involvement. 4. Splenomegaly. 5. Increased tonsillar soft tissue, right greater than left, concerning for lymphomatous involvement. Correlate with direct visual inspection and/or contrast-enhanced neck CT. Electronically Signed   By: Yetta Glassman M.D.   On: 12/29/2021 16:02   ECHOCARDIOGRAM COMPLETE  Result Date: 12/10/2021    ECHOCARDIOGRAM REPORT  Patient Name:   Jerry Tran Date of Exam: 12/10/2021 Medical Rec #:  [id number]    Height:       76.0 in Accession #:    [id number]   Weight:       213.2 lb Date of Birth:  1946/10/04    BSA:          2.276 m Patient Age:    76 years     BP:           109/71 mmHg Patient Gender: M            HR:           71 bpm. Exam Location:  Inpatient Procedure: 2D Echo, 3D Echo, Cardiac Doppler and Color Doppler Indications:    R06.02 SOB  History:        Patient has no prior history of Echocardiogram examinations.                 Abnormal ECG, Arrythmias:Atrial Fibrillation,                 Signs/Symptoms:Dyspnea; Risk Factors:Hypertension, Dyslipidemia                 and Former Smoker. Cancer.  Sonographer:    Roseanna Rainbow RDCS Referring Phys: [id number] BELKYS A REGALADO IMPRESSIONS  1. Left ventricular ejection fraction, by estimation, is 55 to 60%. The left ventricle has normal function. The left ventricle has no regional wall motion abnormalities. Left ventricular diastolic parameters are consistent with Grade I diastolic dysfunction (impaired relaxation).  2. Right ventricular systolic function is normal. The right ventricular size is mildly enlarged. There is moderately elevated pulmonary artery systolic pressure. The estimated right ventricular systolic pressure is 88.4 mmHg.  3. The mitral valve is grossly normal. Trivial mitral valve regurgitation.  4. The aortic valve is tricuspid. There is mild calcification of the aortic valve. There  is mild thickening of the aortic valve. Aortic valve regurgitation is trivial. Aortic valve sclerosis/calcification is present, without any evidence of aortic stenosis.  5. Aortic dilatation noted. There is mild dilatation of the ascending aorta, measuring 37 mm.  6. The inferior vena cava is dilated in size with <50% respiratory variability, suggesting right atrial pressure of 15 mmHg.  7. Frequent PVCs during study Comparison(s): No prior Echocardiogram. FINDINGS  Left Ventricle: Left ventricular ejection fraction, by estimation, is 55 to 60%. The left ventricle has normal function. The left ventricle has no regional wall motion abnormalities. The left ventricular internal cavity size was normal in size. There is  no left ventricular hypertrophy. Left ventricular diastolic parameters are consistent with Grade I diastolic dysfunction (impaired relaxation). Right Ventricle: The right ventricular size is mildly enlarged. No increase in right ventricular wall thickness. Right ventricular systolic function is normal. There is moderately elevated pulmonary artery systolic pressure. The tricuspid regurgitant velocity is 2.77 m/s, and with an assumed right atrial pressure of 15 mmHg, the estimated right ventricular systolic pressure is 16.6 mmHg. Left Atrium: Left atrial size was normal in size. Right Atrium: Right atrial size was normal in size. Pericardium: There is no evidence of pericardial effusion. Mitral Valve: The mitral valve is grossly normal. There is mild thickening of the mitral valve leaflet(s). There is mild calcification of the mitral valve leaflet(s). Mild to moderate mitral annular calcification. Trivial mitral valve regurgitation. Tricuspid Valve: The tricuspid valve is normal in structure. Tricuspid valve regurgitation is mild. Aortic Valve: The aortic valve is tricuspid. There is mild calcification  of the aortic valve. There is mild thickening of the aortic valve. Aortic valve regurgitation is  trivial. Aortic valve sclerosis/calcification is present, without any evidence of aortic stenosis. Pulmonic Valve: The pulmonic valve was normal in structure. Pulmonic valve regurgitation is trivial. Aorta: Aortic dilatation noted. There is mild dilatation of the ascending aorta, measuring 37 mm. Venous: The inferior vena cava is dilated in size with less than 50% respiratory variability, suggesting right atrial pressure of 15 mmHg. IAS/Shunts: The atrial septum is grossly normal.  LEFT VENTRICLE PLAX 2D LVIDd:         5.60 cm      Diastology LVIDs:         3.50 cm      LV e' medial:    8.49 cm/s LV PW:         1.00 cm      LV E/e' medial:  8.5 LV IVS:        0.90 cm      LV e' lateral:   8.98 cm/s LVOT diam:     2.10 cm      LV E/e' lateral: 8.0 LV SV:         70 LV SV Index:   31 LVOT Area:     3.46 cm  LV Volumes (MOD) LV vol d, MOD A2C: 158.0 ml LV vol d, MOD A4C: 144.0 ml LV vol s, MOD A2C: 66.7 ml LV vol s, MOD A4C: 69.3 ml LV SV MOD A2C:     91.3 ml LV SV MOD A4C:     144.0 ml LV SV MOD BP:      84.2 ml RIGHT VENTRICLE             IVC RV S prime:     13.20 cm/s  IVC diam: 2.30 cm RVOT diam:      2.30 cm TAPSE (M-mode): 2.0 cm LEFT ATRIUM           Index        RIGHT ATRIUM           Index LA diam:      4.20 cm 1.85 cm/m   RA Area:     18.80 cm LA Vol (A2C): 18.1 ml 7.95 ml/m   RA Volume:   44.00 ml  19.33 ml/m LA Vol (A4C): 61.8 ml 27.15 ml/m  AORTIC VALVE             PULMONIC VALVE LVOT Vmax:   100.00 cm/s PR End Diast Vel: 2.13 msec LVOT Vmean:  66.000 cm/s LVOT VTI:    0.201 m  AORTA Ao Root diam: 3.40 cm Ao Asc diam:  3.70 cm MITRAL VALVE               TRICUSPID VALVE MV Area (PHT): 4.12 cm    TR Peak grad:   30.7 mmHg MV Decel Time: 184 msec    TR Vmax:        277.00 cm/s MV E velocity: 71.82 cm/s MV A velocity: 74.95 cm/s  SHUNTS MV E/A ratio:  0.96        Systemic VTI:  0.20 m                            Systemic Diam: 2.10 cm                            Pulmonic Diam: 2.30  cm Gwyndolyn Kaufman MD  Electronically signed by Gwyndolyn Kaufman MD Signature Date/Time: 12/10/2021/5:22:38 PM    Final    CT Renal Stone Study  Result Date: 12/08/2021 CLINICAL DATA:  Hematuria EXAM: CT ABDOMEN AND PELVIS WITHOUT CONTRAST TECHNIQUE: Multidetector CT imaging of the abdomen and pelvis was performed following the standard protocol without IV contrast. RADIATION DOSE REDUCTION: This exam was performed according to the departmental dose-optimization program which includes automated exposure control, adjustment of the mA and/or kV according to patient size and/or use of iterative reconstruction technique. COMPARISON:  None. FINDINGS: Lower chest: Trace left pleural effusion. Hypodense blood pool relative to myocardium, suggesting anemia. Hepatobiliary: Unenhanced liver is unremarkable. Gallbladder is unremarkable. No intrahepatic or extrahepatic ductal dilatation. Pancreas: Within normal limits. Spleen: Enlarged, measuring 22 in maximal craniocaudal dimension. Adrenals/Urinary Tract: Adrenal glands are within normal limits. Kidneys are within normal limits. No renal calculi or hydronephrosis. Thick-walled bladder, although underdistended. Stomach/Bowel: Stomach is notable for mild concentric wall thickening of the gastric antrum (series 3/image 32), although poorly evaluated on CT. No evidence of bowel obstruction. Normal appendix (series 3/image 56). Scattered colonic diverticulosis, without evidence of diverticulitis. Vascular/Lymphatic: No evidence of abdominal aortic aneurysm. Abdominopelvic lymphadenopathy, poorly evaluated on unenhanced CT, including: --2.8 cm short axis node in the porta hepatis (series 3/image 30) --2.8 cm short axis right para-aortic node (series 3/image 46) --3.2 cm short axis left external iliac node (series 3/image 63) --2.8 cm short axis right external iliac node (series 3/image 72) --2.0 cm short axis left inguinal node (series 3/image 88) --1.8 cm short axis right inguinal node (series  3/image 93) Reproductive: Prostate is notable for fiducial markers. Associated presacral soft tissue thickening likely reflects radiation changes. Other: Small volume pelvic ascites. Musculoskeletal: Degenerative changes of the visualized thoracolumbar spine. No focal osseous lesions. IMPRESSION: Abdominopelvic lymphadenopathy with splenomegaly, suggesting lymphoma. Fiducial markers along the prostate, suggesting radiation treatment for prostate cancer. Technically speaking, at least some of the lymphadenopathy could also be related to this diagnosis, although this would not account for splenomegaly. Thick-walled bladder, possibly reflecting radiation changes. Trace left pleural effusion. Electronically Signed   By: Julian Hy M.D.   On: 12/08/2021 23:58   VAS [place] LOWER EXTREMITY VENOUS (DVT) (7a-7p)  Result Date: 12/14/2021  Lower Venous DVT Study Patient Name:  GRAYDON FOFANA  Date of Exam:   12/14/2021 Medical Rec #: [id number]     Accession #:    [id number] Date of Birth: [birth year]     Patient Gender: M Patient Age:   79 years Exam Location:  [facility] Procedure:      VAS [place] LOWER EXTREMITY VENOUS (DVT) Referring Phys: Domenic Moras --------------------------------------------------------------------------------  Indications: Left lower extremity pitting edema and pain x "months" per patient.  Limitations: Poor ultrasound/tissue interface. Comparison Study: No prior study Performing Technologist: Maudry Mayhew MHA, RDMS, RVT, RDCS  Examination Guidelines: A complete evaluation includes B-mode imaging, spectral Doppler, color Doppler, and power Doppler as needed of all accessible portions of each vessel. Bilateral testing is considered an integral part of a complete examination. Limited examinations for reoccurring indications may be performed as noted. The reflux portion of the exam is performed with the patient in reverse Trendelenburg.   +-----+---------------+---------+-----------+----------+--------------+  RIGHT Compressibility Phasicity Spontaneity Properties Thrombus Aging  +-----+---------------+---------+-----------+----------+--------------+  CFV   Full            Yes       Yes                                    +-----+---------------+---------+-----------+----------+--------------+   +---------+---------------+---------+-----------+----------+--------------+  LEFT      Compressibility Phasicity Spontaneity Properties Thrombus Aging  +---------+---------------+---------+-----------+----------+--------------+  CFV       Full            Yes       Yes                                    +---------+---------------+---------+-----------+----------+--------------+  SFJ       Full                                                             +---------+---------------+---------+-----------+----------+--------------+  FV Prox   Full                                                             +---------+---------------+---------+-----------+----------+--------------+  FV Mid    Full                                                             +---------+---------------+---------+-----------+----------+--------------+  FV Distal Full                                                             +---------+---------------+---------+-----------+----------+--------------+  PFV       Full                                                             +---------+---------------+---------+-----------+----------+--------------+  POP       Full            Yes       Yes                                    +---------+---------------+---------+-----------+----------+--------------+  PTV       Full                                                             +---------+---------------+---------+-----------+----------+--------------+  PERO      Full                                                              +---------+---------------+---------+-----------+----------+--------------+  Summary: RIGHT: - No evidence of common femoral vein obstruction.  LEFT: - There is no evidence of deep vein thrombosis in the lower extremity.  - No cystic structure found in the popliteal fossa. - Ultrasound characteristics of enlarged lymph nodes noted in the groin.  *See table(s) above for measurements and observations. Electronically signed by Harold Barban MD on 12/14/2021 at 7:39:15 PM.    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 PET CT scan 12/29/2021  1. Enlarged hypermetabolic cervical, bilateral axillary, retroperitoneal, pelvic, and inguinal lymph nodes. Deauville 4. 2. Subcentimeter mediastinal lymph nodes with uptake similar to mediastinal blood pool. 3. Focal hypermetabolic uptake seen at the head of the right clavicle, concerning for osseous involvement. 4. Splenomegaly. 5. Increased tonsillar soft tissue, right greater than left, concerning for lymphomatous involvement.  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  3) moderate to severe protein calorie malnutrition  4) acute renal injury-likely due poor p.o. intake.  Due to spontaneous tumor lysis.  Also could be due to his hematuria and some element of urinary obstruction.  5) massive splenomegaly due to mantle cell lymphoma.  6) history of spontaneous tumor lysis due to mantle cell  lymphoma needing IV fluids and rasburicase currently on allopurinol.  Uric acid 4.9 currently.  PLAN -Labs done today reviewed -Hemoglobin is 8.3.  No indication for PRBC transfusion at this time.  We will need to monitor closely for need of transfusion after starting chemotherapy since he already has bone marrow involvement and might have some bone marrow suppression with chemotherapy as well. -Platelets 58,000 no bleeding.  Monitor.  Avoid NSAIDs or other medications that could lower platelet count. -Continue allopurinol 100 mg p.o. twice daily -We will start on low-dose diuretics for lower extremity swelling due to steroids  -Patient will stop his prednisone after starting Bendamustine Rituxan chemotherapy tomorrow  -Patient appropriate to proceed with Bendamustine Rituxan chemotherapy tomorrow.  Will dose reduce Bendamustine to 50 mg per metered squared given his cytopenias.  We will increase dose as tolerated. Hepatitis B core antibody was positive.  Hepatitis B DNA PCR negative. -He will need weekly labs with transfusion support as indicated. -MD visit for toxicity check in 2 weeks -He was recommended to drink at least 2 L of water daily to keep his kidneys flushed. -Outpatient PET CT scan 12/28/2021 reviewed patient has significant bulky disease. -We discussed option for port and patient prefers to hold off currently -Bendamustine Rituxan chemotherapy orders reviewed and signed. -We will start the patient on low-dose torsemide to address pedal edema from steroids.  Also recommended using compression socks and keeping legs elevated.  Patient is now off prednisone and this should help as well.   No orders of the defined types were placed in this encounter.   Follow-up Please schedule for weekly labs with 1 unit of platelets and 1 unit of PRBC as needed x 3 -MD visit in 2 weeks for toxicity check  The total time spent in the appointment was 35 minutes*.  All of the patient's  questions  were answered with apparent satisfaction. The patient knows to call the clinic with any problems, questions or concerns.   Sullivan Lone MD MS AAHIVMS [facility] Hematology/On[facility]  .*Total Encounter Time as defined by the Centers for Medicare and Medicaid Services includes, in addition to the face-to-face time of a patient visit (documented in the note above) non-face-to-face time: obtaining and reviewing outside history, ordering and reviewing medications, tests or procedures, care coordination (communications with other health care professionals or caregivers) and documentation in the medical record.

## 2022-01-04 ENCOUNTER — Inpatient Hospital Stay: Payer: Medicare Other

## 2022-01-04 ENCOUNTER — Other Ambulatory Visit: Payer: Self-pay

## 2022-01-04 DIAGNOSIS — C8318 Mantle cell lymphoma, lymph nodes of multiple sites: Secondary | ICD-10-CM | POA: Diagnosis not present

## 2022-01-04 LAB — CBC WITH DIFFERENTIAL (CANCER CENTER ONLY)
Abs Immature Granulocytes: 0 10*3/uL (ref 0.00–0.07)
Basophils Absolute: 0 10*3/uL (ref 0.0–0.1)
Basophils Relative: 0 %
Eosinophils Absolute: 0 10*3/uL (ref 0.0–0.5)
Eosinophils Relative: 0 %
HCT: 26 % — ABNORMAL LOW (ref 39.0–52.0)
Hemoglobin: 8.4 g/dL — ABNORMAL LOW (ref 13.0–17.0)
Immature Granulocytes: 0 %
Lymphocytes Relative: 13 %
Lymphs Abs: 0.2 10*3/uL — ABNORMAL LOW (ref 0.7–4.0)
MCH: 29.8 pg (ref 26.0–34.0)
MCHC: 32.3 g/dL (ref 30.0–36.0)
MCV: 92.2 fL (ref 80.0–100.0)
Monocytes Absolute: 0 10*3/uL — ABNORMAL LOW (ref 0.1–1.0)
Monocytes Relative: 3 %
Neutro Abs: 1 10*3/uL — ABNORMAL LOW (ref 1.7–7.7)
Neutrophils Relative %: 84 %
Platelet Count: 61 10*3/uL — ABNORMAL LOW (ref 150–400)
RBC: 2.82 MIL/uL — ABNORMAL LOW (ref 4.22–5.81)
RDW: 15.8 % — ABNORMAL HIGH (ref 11.5–15.5)
WBC Count: 1.2 10*3/uL — ABNORMAL LOW (ref 4.0–10.5)
nRBC: 0 % (ref 0.0–0.2)

## 2022-01-04 LAB — SAMPLE TO BLOOD BANK

## 2022-01-04 LAB — CMP (CANCER CENTER ONLY)
ALT: 15 U/L (ref 0–44)
AST: 18 U/L (ref 15–41)
Albumin: 3.4 g/dL — ABNORMAL LOW (ref 3.5–5.0)
Alkaline Phosphatase: 59 U/L (ref 38–126)
Anion gap: 7 (ref 5–15)
BUN: 40 mg/dL — ABNORMAL HIGH (ref 8–23)
CO2: 25 mmol/L (ref 22–32)
Calcium: 9.1 mg/dL (ref 8.9–10.3)
Chloride: 104 mmol/L (ref 98–111)
Creatinine: 1.27 mg/dL — ABNORMAL HIGH (ref 0.61–1.24)
GFR, Estimated: 59 mL/min — ABNORMAL LOW (ref >60)
Glucose, Bld: 129 mg/dL — ABNORMAL HIGH (ref 70–99)
Potassium: 3.7 mmol/L (ref 3.5–5.1)
Sodium: 136 mmol/L (ref 135–145)
Total Bilirubin: 0.9 mg/dL (ref 0.3–1.2)
Total Protein: 7.5 g/dL (ref 6.5–8.1)

## 2022-01-04 LAB — URIC ACID: Uric Acid, Serum: 8.6 mg/dL (ref 3.7–8.6)

## 2022-01-04 NOTE — Progress Notes (Unsigned)
Per [name], patient will not be transfused today with Hgb of 8.4g/dL.  Jerry Tran will speak with patient ?

## 2022-01-06 ENCOUNTER — Inpatient Hospital Stay: Payer: Medicare Other

## 2022-01-10 ENCOUNTER — Other Ambulatory Visit: Payer: Self-pay | Admitting: Hematology

## 2022-01-11 ENCOUNTER — Other Ambulatory Visit: Payer: Self-pay | Admitting: Hematology

## 2022-01-12 ENCOUNTER — Other Ambulatory Visit: Payer: Self-pay | Admitting: Hematology

## 2022-01-13 ENCOUNTER — Inpatient Hospital Stay: Payer: Medicare Other

## 2022-01-13 ENCOUNTER — Other Ambulatory Visit: Payer: Self-pay

## 2022-01-13 ENCOUNTER — Other Ambulatory Visit: Payer: Self-pay | Admitting: Hematology

## 2022-01-13 VITALS — BP 127/79 | HR 66 | Temp 98.2°F | Resp 16 | Wt 200.5 lb

## 2022-01-13 DIAGNOSIS — C8318 Mantle cell lymphoma, lymph nodes of multiple sites: Secondary | ICD-10-CM

## 2022-01-13 DIAGNOSIS — E883 Tumor lysis syndrome: Secondary | ICD-10-CM

## 2022-01-13 LAB — CBC WITH DIFFERENTIAL (CANCER CENTER ONLY)
Abs Immature Granulocytes: 0 10*3/uL (ref 0.00–0.07)
Basophils Absolute: 0 10*3/uL (ref 0.0–0.1)
Basophils Relative: 0 %
Eosinophils Absolute: 0 10*3/uL (ref 0.0–0.5)
Eosinophils Relative: 3 %
HCT: 23.6 % — ABNORMAL LOW (ref 39.0–52.0)
Hemoglobin: 7.5 g/dL — ABNORMAL LOW (ref 13.0–17.0)
Immature Granulocytes: 0 %
Lymphocytes Relative: 26 %
Lymphs Abs: 0.2 10*3/uL — ABNORMAL LOW (ref 0.7–4.0)
MCH: 29.6 pg (ref 26.0–34.0)
MCHC: 31.8 g/dL (ref 30.0–36.0)
MCV: 93.3 fL (ref 80.0–100.0)
Monocytes Absolute: 0.1 10*3/uL (ref 0.1–1.0)
Monocytes Relative: 6 %
Neutro Abs: 0.5 10*3/uL — ABNORMAL LOW (ref 1.7–7.7)
Neutrophils Relative %: 65 %
Platelet Count: 76 10*3/uL — ABNORMAL LOW (ref 150–400)
RBC: 2.53 MIL/uL — ABNORMAL LOW (ref 4.22–5.81)
RDW: 15.6 % — ABNORMAL HIGH (ref 11.5–15.5)
Smear Review: NORMAL
WBC Count: 0.8 10*3/uL — CL (ref 4.0–10.5)
nRBC: 0 % (ref 0.0–0.2)

## 2022-01-13 LAB — PREPARE RBC (CROSSMATCH)

## 2022-01-13 LAB — CMP (CANCER CENTER ONLY)
ALT: 15 U/L (ref 0–44)
AST: 17 U/L (ref 15–41)
Albumin: 3.2 g/dL — ABNORMAL LOW (ref 3.5–5.0)
Alkaline Phosphatase: 60 U/L (ref 38–126)
Anion gap: 5 (ref 5–15)
BUN: 25 mg/dL — ABNORMAL HIGH (ref 8–23)
CO2: 26 mmol/L (ref 22–32)
Calcium: 8.7 mg/dL — ABNORMAL LOW (ref 8.9–10.3)
Chloride: 107 mmol/L (ref 98–111)
Creatinine: 1.23 mg/dL (ref 0.61–1.24)
GFR, Estimated: >60 mL/min (ref >60)
Glucose, Bld: 98 mg/dL (ref 70–99)
Potassium: 3.6 mmol/L (ref 3.5–5.1)
Sodium: 138 mmol/L (ref 135–145)
Total Bilirubin: 0.4 mg/dL (ref 0.3–1.2)
Total Protein: 6.6 g/dL (ref 6.5–8.1)

## 2022-01-13 LAB — URIC ACID: Uric Acid, Serum: 6.6 mg/dL (ref 3.7–8.6)

## 2022-01-13 MED ORDER — SODIUM CHLORIDE 0.9% IV SOLUTION
250.0000 mL | Freq: Once | INTRAVENOUS | Status: AC
  Administered 2022-01-13: 250 mL via INTRAVENOUS

## 2022-01-13 MED ORDER — FILGRASTIM-SNDZ 480 MCG/0.8ML IJ SOSY
480.0000 ug | PREFILLED_SYRINGE | Freq: Once | INTRAMUSCULAR | Status: AC
  Administered 2022-01-13: 480 ug via SUBCUTANEOUS
  Filled 2022-01-13: qty 0.8

## 2022-01-13 MED ORDER — TBO-FILGRASTIM 480 MCG/0.8ML ~~LOC~~ SOSY: 480.0000 ug | PREFILLED_SYRINGE | Freq: Once | SUBCUTANEOUS | Status: DC

## 2022-01-13 MED ORDER — METHYLPREDNISOLONE SODIUM SUCC 40 MG IJ SOLR
40.0000 mg | Freq: Once | INTRAMUSCULAR | Status: AC
  Administered 2022-01-13: 40 mg via INTRAVENOUS
  Filled 2022-01-13: qty 1

## 2022-01-13 MED ORDER — ACETAMINOPHEN 325 MG PO TABS
650.0000 mg | ORAL_TABLET | Freq: Once | ORAL | Status: AC
  Administered 2022-01-13: 650 mg via ORAL
  Filled 2022-01-13: qty 2

## 2022-01-13 NOTE — Progress Notes (Unsigned)
CRIT WBC (0.8) CALLED TO [name] WRIGHT AT 6606.[id number] ?

## 2022-01-13 NOTE — Patient Instructions (Addendum)
Blood Transfusion, Adult ?A blood transfusion is a procedure in which you receive blood or a type of blood cell (blood component) through an IV. You may need a blood transfusion when your blood level is low. This may result from a bleeding disorder, illness, injury, or surgery. The blood may come from a donor. You may also be able to donate blood for yourself (autologous blood donation) before a planned surgery. ?The blood given in a transfusion is made up of different blood components. You may receive: ?Red blood cells. These carry oxygen to the cells in the body. ?Platelets. These help your blood to clot. ?Plasma. This is the liquid part of your blood. It carries proteins and other substances throughout the body. ?White blood cells. These help you fight infections. ?If you have hemophilia or another clotting disorder, you may also receive other types of blood products. ?Tell a health care provider about: ?Any blood disorders you have. ?Any previous reactions you have had during a blood transfusion. ?Any allergies you have. ?All medicines you are taking, including vitamins, herbs, eye drops, creams, and over-the-counter medicines. ?Any surgeries you have had. ?Any medical conditions you have, including any recent fever or cold symptoms. ?Whether you are pregnant or may be pregnant. ?What are the risks? ?Generally, this is a safe procedure. However, problems may occur. ?The most common problems include: ?A mild allergic reaction, such as red, swollen areas of skin (hives) and itching. ?Fever or chills. This may be the body's response to new blood cells received. This may occur during or up to 4 hours after the transfusion. ?More serious problems may include: ?Transfusion-associated circulatory overload (TACO), or too much fluid in the lungs. This may cause breathing problems. ?A serious allergic reaction, such as difficulty breathing or swelling around the face and lips. ?Transfusion-related acute lung injury  (TRALI), which causes breathing difficulty and low oxygen in the blood. This can occur within hours of the transfusion or several days later. ?Iron overload. This can happen after receiving many blood transfusions over a period of time. ?Infection or virus being transmitted. This is rare because donated blood is carefully tested before it is given. ?Hemolytic transfusion reaction. This is rare. It happens when your body's defense system (immune system)tries to attack the new blood cells. Symptoms may include fever, chills, nausea, low blood pressure, and low back or chest pain. ?Transfusion-associated graft-versus-host disease (TAGVHD). This is rare. It happens when donated cells attack your body's healthy tissues. ?What happens before the procedure? ?Medicines ?Ask your health care provider about: ?Changing or stopping your regular medicines. This is especially important if you are taking diabetes medicines or blood thinners. ?Taking medicines such as aspirin and ibuprofen. These medicines can thin your blood. Do not take these medicines unless your health care provider tells you to take them. ?Taking over-the-counter medicines, vitamins, herbs, and supplements. ?General instructions ?Follow instructions from your health care provider about eating and drinking restrictions. ?You will have a blood test to determine your blood type. This is necessary to know what kind of blood your body will accept and to match it to the donor blood. ?If you are going to have a planned surgery, you may be able to do an autologous blood donation. This may be done in case you need to have a transfusion. ?You will have your temperature, blood pressure, and pulse monitored before the transfusion. ?If you have had an allergic reaction to a transfusion in the past, you may be given medicine to help prevent  a reaction. This medicine may be given to you by mouth (orally) or through an IV. ?Set aside time for the blood transfusion. This  procedure generally takes 1-4 hours to complete. ?What happens during the procedure? ? ?An IV will be inserted into one of your veins. ?The bag of donated blood will be attached to your IV. The blood will then enter through your vein. ?Your temperature, blood pressure, and pulse will be monitored regularly during the transfusion. This monitoring is done to detect early signs of a transfusion reaction. ?Tell your nurse right away if you have any of these symptoms during the transfusion: ?Shortness of breath or trouble breathing. ?Chest or back pain. ?Fever or chills. ?Hives or itching. ?If you have any signs or symptoms of a reaction, your transfusion will be stopped and you may be given medicine. ?When the transfusion is complete, your IV will be removed. ?Pressure may be applied to the IV site for a few minutes. ?A bandage (dressing)will be applied. ?The procedure may vary among health care providers and hospitals. ?What happens after the procedure? ?Your temperature, blood pressure, pulse, breathing rate, and blood oxygen level will be monitored until you leave the hospital or clinic. ?Your blood may be tested to see how you are responding to the transfusion. ?You may be warmed with fluids or blankets to maintain a normal body temperature. ?If you receive your blood transfusion in an outpatient setting, you will be told whom to contact to report any reactions. ?Where to find more information ?For more information on blood transfusions, visit the [employer]: redcross.org ?Summary ?A blood transfusion is a procedure in which you receive blood or a type of blood cell (blood component) through an IV. ?The blood you receive may come from a donor or be donated by yourself (autologous blood donation) before a planned surgery. ?The blood given in a transfusion is made up of different blood components. You may receive red blood cells, platelets, plasma, or white blood cells depending on the condition treated. ?Your  temperature, blood pressure, and pulse will be monitored before, during, and after the transfusion. ?After the transfusion, your blood may be tested to see how your body has responded. ?This information is not intended to replace advice given to you by your health care provider. Make sure you discuss any questions you have with your health care provider. ?Document Revised: 08/21/2019 Document Reviewed: 04/10/2019 ?Elsevier Patient Education ? [place]. ?Filgrastim, G-CSF injection ?What is this medication? ?FILGRASTIM, G-CSF (fil GRA stim) is a granulocyte colony-stimulating factor that stimulates the growth of neutrophils, a type of white blood cell (WBC) important in the body's fight against infection. It is used to reduce the incidence of fever and infection in patients with certain types of cancer who are receiving chemotherapy that affects the bone marrow, to stimulate blood cell production for removal of WBCs from the body prior to a bone marrow transplantation, to reduce the incidence of fever and infection in patients who have severe chronic neutropenia, and to improve survival outcomes following high-dose radiation exposure that is toxic to the bone marrow. ?This medicine may be used for other purposes; ask your health care provider or pharmacist if you have questions. ?COMMON BRAND NAME(S): Neupogen, Nivestym, Releuko, Zarxio ?What should I tell my care team before I take this medication? ?They need to know if you have any of these conditions: ?kidney disease ?latex allergy ?ongoing radiation therapy ?sickle cell disease ?an unusual or allergic reaction to filgrastim, pegfilgrastim,  other medicines, foods, dyes, or preservatives ?pregnant or trying to get pregnant ?breast-feeding ?How should I use this medication? ?This medicine is for injection under the skin or infusion into a vein. As an infusion into a vein, it is usually given by a health care professional in a hospital or clinic setting. If  you get this medicine at home, you will be taught how to prepare and give this medicine. Refer to the Instructions for Use that come with your medication packaging. Use exactly as directed. Take your medicine a

## 2022-01-13 NOTE — Progress Notes (Signed)
CRITICAL VALUE STICKER ? ?CRITICAL VALUE:WBC: 0.8 ? ?DATE & TIME NOTIFIED: 01/13/22 10:35 ? ?MD NOTIFIED: Irene Limbo ? ?TIME OF NOTIFICATION: 10:36 ? ? ?

## 2022-01-14 ENCOUNTER — Inpatient Hospital Stay: Payer: Medicare Other

## 2022-01-14 ENCOUNTER — Other Ambulatory Visit: Payer: Self-pay

## 2022-01-14 VITALS — BP 106/60 | HR 73 | Temp 98.1°F | Resp 19

## 2022-01-14 DIAGNOSIS — E883 Tumor lysis syndrome: Secondary | ICD-10-CM

## 2022-01-14 DIAGNOSIS — C8318 Mantle cell lymphoma, lymph nodes of multiple sites: Secondary | ICD-10-CM | POA: Diagnosis not present

## 2022-01-14 LAB — BPAM RBC
Blood Product Expiration Date: 202304022359
ISSUE DATE / TIME: 202303171239
Unit Type and Rh: 7300

## 2022-01-14 LAB — TYPE AND SCREEN
ABO/RH(D): B POS
Antibody Screen: NEGATIVE
Unit division: 0

## 2022-01-14 MED ORDER — FILGRASTIM-SNDZ 480 MCG/0.8ML IJ SOSY
480.0000 ug | PREFILLED_SYRINGE | Freq: Once | INTRAMUSCULAR | Status: AC
  Administered 2022-01-14: 480 ug via SUBCUTANEOUS
  Filled 2022-01-14: qty 0.8

## 2022-01-14 NOTE — Patient Instructions (Signed)
Filgrastim, G-CSF injection °What is this medication? °FILGRASTIM, G-CSF (fil GRA stim) is a granulocyte colony-stimulating factor that stimulates the growth of neutrophils, a type of white blood cell (WBC) important in the body's fight against infection. It is used to reduce the incidence of fever and infection in patients with certain types of cancer who are receiving chemotherapy that affects the bone marrow, to stimulate blood cell production for removal of WBCs from the body prior to a bone marrow transplantation, to reduce the incidence of fever and infection in patients who have severe chronic neutropenia, and to improve survival outcomes following high-dose radiation exposure that is toxic to the bone marrow. °This medicine may be used for other purposes; ask your health care provider or pharmacist if you have questions. °COMMON BRAND NAME(S): Neupogen, Nivestym, Releuko, Zarxio °What should I tell my care team before I take this medication? °They need to know if you have any of these conditions: °kidney disease °latex allergy °ongoing radiation therapy °sickle cell disease °an unusual or allergic reaction to filgrastim, pegfilgrastim, other medicines, foods, dyes, or preservatives °pregnant or trying to get pregnant °breast-feeding °How should I use this medication? °This medicine is for injection under the skin or infusion into a vein. As an infusion into a vein, it is usually given by a health care professional in a hospital or clinic setting. If you get this medicine at home, you will be taught how to prepare and give this medicine. Refer to the Instructions for Use that come with your medication packaging. Use exactly as directed. Take your medicine at regular intervals. Do not take your medicine more often than directed. °It is important that you put your used needles and syringes in a special sharps container. Do not put them in a trash can. If you do not have a sharps container, call your pharmacist  or healthcare provider to get one. °Talk to your pediatrician regarding the use of this medicine in children. While this drug may be prescribed for children as young as 7 months for selected conditions, precautions do apply. °Overdosage: If you think you have taken too much of this medicine contact a poison control center or emergency room at once. °NOTE: This medicine is only for you. Do not share this medicine with others. °What if I miss a dose? °It is important not to miss your dose. Call your doctor or health care professional if you miss a dose. °What may interact with this medication? °This medicine may interact with the following medications: °medicines that may cause a release of neutrophils, such as lithium °This list may not describe all possible interactions. Give your health care provider a list of all the medicines, herbs, non-prescription drugs, or dietary supplements you use. Also tell them if you smoke, drink alcohol, or use illegal drugs. Some items may interact with your medicine. °What should I watch for while using this medication? °Your condition will be monitored carefully while you are receiving this medicine. °You may need blood work done while you are taking this medicine. °Talk to your health care provider about your risk of cancer. You may be more at risk for certain types of cancer if you take this medicine. °What side effects may I notice from receiving this medication? °Side effects that you should report to your doctor or health care professional as soon as possible: °allergic reactions like skin rash, itching or hives, swelling of the face, lips, or tongue °back pain °dizziness or feeling faint °fever °pain, redness, or   irritation at site where injected °pinpoint red spots on the skin °shortness of breath or breathing problems °signs and symptoms of kidney injury like trouble passing urine, change in the amount of urine, or red or dark-brown urine °stomach or side pain, or pain at  the shoulder °swelling °tiredness °unusual bleeding or bruising °Side effects that usually do not require medical attention (report to your doctor or health care professional if they continue or are bothersome): °bone pain °cough °diarrhea °hair loss °headache °muscle pain °This list may not describe all possible side effects. Call your doctor for medical advice about side effects. You may report side effects to FDA at 1-800-FDA-1088. °Where should I keep my medication? °Keep out of the reach of children. °Store in a refrigerator between 2 and 8 degrees C (36 and 46 degrees F). Do not freeze. Keep in carton to protect from light. Throw away this medicine if vials or syringes are left out of the refrigerator for more than 24 hours. Throw away any unused medicine after the expiration date. °NOTE: This sheet is a summary. It may not cover all possible information. If you have questions about this medicine, talk to your doctor, pharmacist, or health care provider. °© 2022 [employer]vier/[employer] (2021-07-05 00:00:00) ° °

## 2022-01-16 ENCOUNTER — Other Ambulatory Visit: Payer: Self-pay

## 2022-01-16 ENCOUNTER — Inpatient Hospital Stay: Payer: Medicare Other

## 2022-01-16 VITALS — BP 143/66 | HR 79 | Temp 98.3°F | Resp 18

## 2022-01-16 DIAGNOSIS — E883 Tumor lysis syndrome: Secondary | ICD-10-CM

## 2022-01-16 DIAGNOSIS — C8318 Mantle cell lymphoma, lymph nodes of multiple sites: Secondary | ICD-10-CM | POA: Diagnosis not present

## 2022-01-16 MED ORDER — FILGRASTIM-SNDZ 480 MCG/0.8ML IJ SOSY
480.0000 ug | PREFILLED_SYRINGE | Freq: Once | INTRAMUSCULAR | Status: AC
  Administered 2022-01-16: 480 ug via SUBCUTANEOUS
  Filled 2022-01-16: qty 0.8

## 2022-01-16 NOTE — Patient Instructions (Signed)
Filgrastim, G-CSF injection °What is this medication? °FILGRASTIM, G-CSF (fil GRA stim) is a granulocyte colony-stimulating factor that stimulates the growth of neutrophils, a type of white blood cell (WBC) important in the body's fight against infection. It is used to reduce the incidence of fever and infection in patients with certain types of cancer who are receiving chemotherapy that affects the bone marrow, to stimulate blood cell production for removal of WBCs from the body prior to a bone marrow transplantation, to reduce the incidence of fever and infection in patients who have severe chronic neutropenia, and to improve survival outcomes following high-dose radiation exposure that is toxic to the bone marrow. °This medicine may be used for other purposes; ask your health care provider or pharmacist if you have questions. °COMMON BRAND NAME(S): Neupogen, Nivestym, Releuko, Zarxio °What should I tell my care team before I take this medication? °They need to know if you have any of these conditions: °kidney disease °latex allergy °ongoing radiation therapy °sickle cell disease °an unusual or allergic reaction to filgrastim, pegfilgrastim, other medicines, foods, dyes, or preservatives °pregnant or trying to get pregnant °breast-feeding °How should I use this medication? °This medicine is for injection under the skin or infusion into a vein. As an infusion into a vein, it is usually given by a health care professional in a hospital or clinic setting. If you get this medicine at home, you will be taught how to prepare and give this medicine. Refer to the Instructions for Use that come with your medication packaging. Use exactly as directed. Take your medicine at regular intervals. Do not take your medicine more often than directed. °It is important that you put your used needles and syringes in a special sharps container. Do not put them in a trash can. If you do not have a sharps container, call your pharmacist  or healthcare provider to get one. °Talk to your pediatrician regarding the use of this medicine in children. While this drug may be prescribed for children as young as 7 months for selected conditions, precautions do apply. °Overdosage: If you think you have taken too much of this medicine contact a poison control center or emergency room at once. °NOTE: This medicine is only for you. Do not share this medicine with others. °What if I miss a dose? °It is important not to miss your dose. Call your doctor or health care professional if you miss a dose. °What may interact with this medication? °This medicine may interact with the following medications: °medicines that may cause a release of neutrophils, such as lithium °This list may not describe all possible interactions. Give your health care provider a list of all the medicines, herbs, non-prescription drugs, or dietary supplements you use. Also tell them if you smoke, drink alcohol, or use illegal drugs. Some items may interact with your medicine. °What should I watch for while using this medication? °Your condition will be monitored carefully while you are receiving this medicine. °You may need blood work done while you are taking this medicine. °Talk to your health care provider about your risk of cancer. You may be more at risk for certain types of cancer if you take this medicine. °What side effects may I notice from receiving this medication? °Side effects that you should report to your doctor or health care professional as soon as possible: °allergic reactions like skin rash, itching or hives, swelling of the face, lips, or tongue °back pain °dizziness or feeling faint °fever °pain, redness, or   irritation at site where injected °pinpoint red spots on the skin °shortness of breath or breathing problems °signs and symptoms of kidney injury like trouble passing urine, change in the amount of urine, or red or dark-brown urine °stomach or side pain, or pain at  the shoulder °swelling °tiredness °unusual bleeding or bruising °Side effects that usually do not require medical attention (report to your doctor or health care professional if they continue or are bothersome): °bone pain °cough °diarrhea °hair loss °headache °muscle pain °This list may not describe all possible side effects. Call your doctor for medical advice about side effects. You may report side effects to FDA at 1-800-FDA-1088. °Where should I keep my medication? °Keep out of the reach of children. °Store in a refrigerator between 2 and 8 degrees C (36 and 46 degrees F). Do not freeze. Keep in carton to protect from light. Throw away this medicine if vials or syringes are left out of the refrigerator for more than 24 hours. Throw away any unused medicine after the expiration date. °NOTE: This sheet is a summary. It may not cover all possible information. If you have questions about this medicine, talk to your doctor, pharmacist, or health care provider. °© 2022 [employer]vier/[employer] (2021-07-05 00:00:00) ° °

## 2022-01-17 ENCOUNTER — Inpatient Hospital Stay: Payer: Medicare Other

## 2022-01-17 VITALS — BP 122/69 | HR 74 | Temp 98.0°F | Resp 18

## 2022-01-17 DIAGNOSIS — C8318 Mantle cell lymphoma, lymph nodes of multiple sites: Secondary | ICD-10-CM

## 2022-01-17 DIAGNOSIS — E883 Tumor lysis syndrome: Secondary | ICD-10-CM

## 2022-01-17 MED ORDER — FILGRASTIM-SNDZ 480 MCG/0.8ML IJ SOSY
480.0000 ug | PREFILLED_SYRINGE | Freq: Once | INTRAMUSCULAR | Status: AC
  Administered 2022-01-17: 480 ug via SUBCUTANEOUS
  Filled 2022-01-17: qty 0.8

## 2022-01-17 NOTE — Patient Instructions (Signed)
Filgrastim, G-CSF injection °What is this medication? °FILGRASTIM, G-CSF (fil GRA stim) is a granulocyte colony-stimulating factor that stimulates the growth of neutrophils, a type of white blood cell (WBC) important in the body's fight against infection. It is used to reduce the incidence of fever and infection in patients with certain types of cancer who are receiving chemotherapy that affects the bone marrow, to stimulate blood cell production for removal of WBCs from the body prior to a bone marrow transplantation, to reduce the incidence of fever and infection in patients who have severe chronic neutropenia, and to improve survival outcomes following high-dose radiation exposure that is toxic to the bone marrow. °This medicine may be used for other purposes; ask your health care provider or pharmacist if you have questions. °COMMON BRAND NAME(S): Neupogen, Nivestym, Releuko, Zarxio °What should I tell my care team before I take this medication? °They need to know if you have any of these conditions: °kidney disease °latex allergy °ongoing radiation therapy °sickle cell disease °an unusual or allergic reaction to filgrastim, pegfilgrastim, other medicines, foods, dyes, or preservatives °pregnant or trying to get pregnant °breast-feeding °How should I use this medication? °This medicine is for injection under the skin or infusion into a vein. As an infusion into a vein, it is usually given by a health care professional in a hospital or clinic setting. If you get this medicine at home, you will be taught how to prepare and give this medicine. Refer to the Instructions for Use that come with your medication packaging. Use exactly as directed. Take your medicine at regular intervals. Do not take your medicine more often than directed. °It is important that you put your used needles and syringes in a special sharps container. Do not put them in a trash can. If you do not have a sharps container, call your pharmacist  or healthcare provider to get one. °Talk to your pediatrician regarding the use of this medicine in children. While this drug may be prescribed for children as young as 7 months for selected conditions, precautions do apply. °Overdosage: If you think you have taken too much of this medicine contact a poison control center or emergency room at once. °NOTE: This medicine is only for you. Do not share this medicine with others. °What if I miss a dose? °It is important not to miss your dose. Call your doctor or health care professional if you miss a dose. °What may interact with this medication? °This medicine may interact with the following medications: °medicines that may cause a release of neutrophils, such as lithium °This list may not describe all possible interactions. Give your health care provider a list of all the medicines, herbs, non-prescription drugs, or dietary supplements you use. Also tell them if you smoke, drink alcohol, or use illegal drugs. Some items may interact with your medicine. °What should I watch for while using this medication? °Your condition will be monitored carefully while you are receiving this medicine. °You may need blood work done while you are taking this medicine. °Talk to your health care provider about your risk of cancer. You may be more at risk for certain types of cancer if you take this medicine. °What side effects may I notice from receiving this medication? °Side effects that you should report to your doctor or health care professional as soon as possible: °allergic reactions like skin rash, itching or hives, swelling of the face, lips, or tongue °back pain °dizziness or feeling faint °fever °pain, redness, or   irritation at site where injected °pinpoint red spots on the skin °shortness of breath or breathing problems °signs and symptoms of kidney injury like trouble passing urine, change in the amount of urine, or red or dark-brown urine °stomach or side pain, or pain at  the shoulder °swelling °tiredness °unusual bleeding or bruising °Side effects that usually do not require medical attention (report to your doctor or health care professional if they continue or are bothersome): °bone pain °cough °diarrhea °hair loss °headache °muscle pain °This list may not describe all possible side effects. Call your doctor for medical advice about side effects. You may report side effects to FDA at 1-800-FDA-1088. °Where should I keep my medication? °Keep out of the reach of children. °Store in a refrigerator between 2 and 8 degrees C (36 and 46 degrees F). Do not freeze. Keep in carton to protect from light. Throw away this medicine if vials or syringes are left out of the refrigerator for more than 24 hours. Throw away any unused medicine after the expiration date. °NOTE: This sheet is a summary. It may not cover all possible information. If you have questions about this medicine, talk to your doctor, pharmacist, or health care provider. °© 2022 [employer]vier/[employer] (2021-07-05 00:00:00) ° °

## 2022-01-18 ENCOUNTER — Other Ambulatory Visit: Payer: Self-pay

## 2022-01-18 ENCOUNTER — Inpatient Hospital Stay: Payer: Medicare Other

## 2022-01-18 VITALS — BP 137/80 | HR 77 | Temp 98.2°F | Resp 18

## 2022-01-18 DIAGNOSIS — C8318 Mantle cell lymphoma, lymph nodes of multiple sites: Secondary | ICD-10-CM | POA: Diagnosis not present

## 2022-01-18 DIAGNOSIS — E883 Tumor lysis syndrome: Secondary | ICD-10-CM

## 2022-01-18 MED ORDER — FILGRASTIM-SNDZ 480 MCG/0.8ML IJ SOSY
480.0000 ug | PREFILLED_SYRINGE | Freq: Once | INTRAMUSCULAR | Status: AC
  Administered 2022-01-18: 480 ug via SUBCUTANEOUS
  Filled 2022-01-18: qty 0.8

## 2022-01-18 NOTE — Patient Instructions (Signed)
Filgrastim, G-CSF injection °What is this medication? °FILGRASTIM, G-CSF (fil GRA stim) is a granulocyte colony-stimulating factor that stimulates the growth of neutrophils, a type of white blood cell (WBC) important in the body's fight against infection. It is used to reduce the incidence of fever and infection in patients with certain types of cancer who are receiving chemotherapy that affects the bone marrow, to stimulate blood cell production for removal of WBCs from the body prior to a bone marrow transplantation, to reduce the incidence of fever and infection in patients who have severe chronic neutropenia, and to improve survival outcomes following high-dose radiation exposure that is toxic to the bone marrow. °This medicine may be used for other purposes; ask your health care provider or pharmacist if you have questions. °COMMON BRAND NAME(S): Neupogen, Nivestym, Releuko, Zarxio °What should I tell my care team before I take this medication? °They need to know if you have any of these conditions: °kidney disease °latex allergy °ongoing radiation therapy °sickle cell disease °an unusual or allergic reaction to filgrastim, pegfilgrastim, other medicines, foods, dyes, or preservatives °pregnant or trying to get pregnant °breast-feeding °How should I use this medication? °This medicine is for injection under the skin or infusion into a vein. As an infusion into a vein, it is usually given by a health care professional in a hospital or clinic setting. If you get this medicine at home, you will be taught how to prepare and give this medicine. Refer to the Instructions for Use that come with your medication packaging. Use exactly as directed. Take your medicine at regular intervals. Do not take your medicine more often than directed. °It is important that you put your used needles and syringes in a special sharps container. Do not put them in a trash can. If you do not have a sharps container, call your pharmacist  or healthcare provider to get one. °Talk to your pediatrician regarding the use of this medicine in children. While this drug may be prescribed for children as young as 7 months for selected conditions, precautions do apply. °Overdosage: If you think you have taken too much of this medicine contact a poison control center or emergency room at once. °NOTE: This medicine is only for you. Do not share this medicine with others. °What if I miss a dose? °It is important not to miss your dose. Call your doctor or health care professional if you miss a dose. °What may interact with this medication? °This medicine may interact with the following medications: °medicines that may cause a release of neutrophils, such as lithium °This list may not describe all possible interactions. Give your health care provider a list of all the medicines, herbs, non-prescription drugs, or dietary supplements you use. Also tell them if you smoke, drink alcohol, or use illegal drugs. Some items may interact with your medicine. °What should I watch for while using this medication? °Your condition will be monitored carefully while you are receiving this medicine. °You may need blood work done while you are taking this medicine. °Talk to your health care provider about your risk of cancer. You may be more at risk for certain types of cancer if you take this medicine. °What side effects may I notice from receiving this medication? °Side effects that you should report to your doctor or health care professional as soon as possible: °allergic reactions like skin rash, itching or hives, swelling of the face, lips, or tongue °back pain °dizziness or feeling faint °fever °pain, redness, or   irritation at site where injected °pinpoint red spots on the skin °shortness of breath or breathing problems °signs and symptoms of kidney injury like trouble passing urine, change in the amount of urine, or red or dark-brown urine °stomach or side pain, or pain at  the shoulder °swelling °tiredness °unusual bleeding or bruising °Side effects that usually do not require medical attention (report to your doctor or health care professional if they continue or are bothersome): °bone pain °cough °diarrhea °hair loss °headache °muscle pain °This list may not describe all possible side effects. Call your doctor for medical advice about side effects. You may report side effects to FDA at 1-800-FDA-1088. °Where should I keep my medication? °Keep out of the reach of children. °Store in a refrigerator between 2 and 8 degrees C (36 and 46 degrees F). Do not freeze. Keep in carton to protect from light. Throw away this medicine if vials or syringes are left out of the refrigerator for more than 24 hours. Throw away any unused medicine after the expiration date. °NOTE: This sheet is a summary. It may not cover all possible information. If you have questions about this medicine, talk to your doctor, pharmacist, or health care provider. °© 2022 [employer]vier/[employer] (2021-07-05 00:00:00) ° °

## 2022-01-20 ENCOUNTER — Inpatient Hospital Stay: Payer: Medicare Other

## 2022-01-20 ENCOUNTER — Other Ambulatory Visit: Payer: Self-pay

## 2022-01-20 ENCOUNTER — Inpatient Hospital Stay (HOSPITAL_BASED_OUTPATIENT_CLINIC_OR_DEPARTMENT_OTHER): Payer: Medicare Other | Admitting: Hematology

## 2022-01-20 VITALS — BP 99/59 | HR 77 | Temp 98.1°F | Resp 20 | Wt 207.6 lb

## 2022-01-20 DIAGNOSIS — C8318 Mantle cell lymphoma, lymph nodes of multiple sites: Secondary | ICD-10-CM

## 2022-01-20 DIAGNOSIS — E883 Tumor lysis syndrome: Secondary | ICD-10-CM

## 2022-01-20 LAB — URIC ACID: Uric Acid, Serum: 7 mg/dL (ref 3.7–8.6)

## 2022-01-20 LAB — CBC WITH DIFFERENTIAL (CANCER CENTER ONLY)
Abs Immature Granulocytes: 0.08 10*3/uL — ABNORMAL HIGH (ref 0.00–0.07)
Basophils Absolute: 0 10*3/uL (ref 0.0–0.1)
Basophils Relative: 1 %
Eosinophils Absolute: 0.1 10*3/uL (ref 0.0–0.5)
Eosinophils Relative: 2 %
HCT: 28 % — ABNORMAL LOW (ref 39.0–52.0)
Hemoglobin: 8.9 g/dL — ABNORMAL LOW (ref 13.0–17.0)
Immature Granulocytes: 2 %
Lymphocytes Relative: 13 %
Lymphs Abs: 0.5 10*3/uL — ABNORMAL LOW (ref 0.7–4.0)
MCH: 29.5 pg (ref 26.0–34.0)
MCHC: 31.8 g/dL (ref 30.0–36.0)
MCV: 92.7 fL (ref 80.0–100.0)
Monocytes Absolute: 0.3 10*3/uL (ref 0.1–1.0)
Monocytes Relative: 6 %
Neutro Abs: 2.9 10*3/uL (ref 1.7–7.7)
Neutrophils Relative %: 76 %
Platelet Count: 130 10*3/uL — ABNORMAL LOW (ref 150–400)
RBC: 3.02 MIL/uL — ABNORMAL LOW (ref 4.22–5.81)
RDW: 15.3 % (ref 11.5–15.5)
Smear Review: NORMAL
WBC Count: 3.9 10*3/uL — ABNORMAL LOW (ref 4.0–10.5)
nRBC: 0 % (ref 0.0–0.2)

## 2022-01-20 LAB — CMP (CANCER CENTER ONLY)
ALT: 9 U/L (ref 0–44)
AST: 13 U/L — ABNORMAL LOW (ref 15–41)
Albumin: 3.1 g/dL — ABNORMAL LOW (ref 3.5–5.0)
Alkaline Phosphatase: 66 U/L (ref 38–126)
Anion gap: 5 (ref 5–15)
BUN: 19 mg/dL (ref 8–23)
CO2: 30 mmol/L (ref 22–32)
Calcium: 8.9 mg/dL (ref 8.9–10.3)
Chloride: 108 mmol/L (ref 98–111)
Creatinine: 1.21 mg/dL (ref 0.61–1.24)
GFR, Estimated: >60 mL/min (ref >60)
Glucose, Bld: 99 mg/dL (ref 70–99)
Potassium: 3.4 mmol/L — ABNORMAL LOW (ref 3.5–5.1)
Sodium: 143 mmol/L (ref 135–145)
Total Bilirubin: 0.4 mg/dL (ref 0.3–1.2)
Total Protein: 6 g/dL — ABNORMAL LOW (ref 6.5–8.1)

## 2022-01-20 LAB — TYPE AND SCREEN
ABO/RH(D): B POS
Antibody Screen: NEGATIVE

## 2022-01-26 ENCOUNTER — Encounter: Payer: Self-pay | Admitting: Hematology

## 2022-01-26 ENCOUNTER — Inpatient Hospital Stay: Payer: Medicare Other

## 2022-01-26 ENCOUNTER — Other Ambulatory Visit: Payer: Self-pay

## 2022-01-26 ENCOUNTER — Inpatient Hospital Stay (HOSPITAL_BASED_OUTPATIENT_CLINIC_OR_DEPARTMENT_OTHER): Payer: Medicare Other | Admitting: Hematology

## 2022-01-26 VITALS — BP 147/82 | HR 71 | Temp 98.0°F | Resp 17 | Wt 207.5 lb

## 2022-01-26 DIAGNOSIS — E883 Tumor lysis syndrome: Secondary | ICD-10-CM

## 2022-01-26 DIAGNOSIS — C8318 Mantle cell lymphoma, lymph nodes of multiple sites: Secondary | ICD-10-CM | POA: Diagnosis not present

## 2022-01-26 DIAGNOSIS — Z5111 Encounter for antineoplastic chemotherapy: Secondary | ICD-10-CM

## 2022-01-26 DIAGNOSIS — Z7189 Other specified counseling: Secondary | ICD-10-CM

## 2022-01-26 LAB — CBC WITH DIFFERENTIAL (CANCER CENTER ONLY)
Abs Immature Granulocytes: 0.05 10*3/uL (ref 0.00–0.07)
Basophils Absolute: 0 10*3/uL (ref 0.0–0.1)
Basophils Relative: 2 %
Eosinophils Absolute: 0.1 10*3/uL (ref 0.0–0.5)
Eosinophils Relative: 3 %
HCT: 30 % — ABNORMAL LOW (ref 39.0–52.0)
Hemoglobin: 9.7 g/dL — ABNORMAL LOW (ref 13.0–17.0)
Immature Granulocytes: 2 %
Lymphocytes Relative: 18 %
Lymphs Abs: 0.4 10*3/uL — ABNORMAL LOW (ref 0.7–4.0)
MCH: 29.8 pg (ref 26.0–34.0)
MCHC: 32.3 g/dL (ref 30.0–36.0)
MCV: 92.3 fL (ref 80.0–100.0)
Monocytes Absolute: 0.3 10*3/uL (ref 0.1–1.0)
Monocytes Relative: 12 %
Neutro Abs: 1.5 10*3/uL — ABNORMAL LOW (ref 1.7–7.7)
Neutrophils Relative %: 63 %
Platelet Count: 222 10*3/uL (ref 150–400)
RBC: 3.25 MIL/uL — ABNORMAL LOW (ref 4.22–5.81)
RDW: 15.1 % (ref 11.5–15.5)
WBC Count: 2.4 10*3/uL — ABNORMAL LOW (ref 4.0–10.5)
nRBC: 0 % (ref 0.0–0.2)

## 2022-01-26 LAB — CMP (CANCER CENTER ONLY)
ALT: 10 U/L (ref 0–44)
AST: 17 U/L (ref 15–41)
Albumin: 3.5 g/dL (ref 3.5–5.0)
Alkaline Phosphatase: 69 U/L (ref 38–126)
Anion gap: 6 (ref 5–15)
BUN: 26 mg/dL — ABNORMAL HIGH (ref 8–23)
CO2: 30 mmol/L (ref 22–32)
Calcium: 9.5 mg/dL (ref 8.9–10.3)
Chloride: 106 mmol/L (ref 98–111)
Creatinine: 1.32 mg/dL — ABNORMAL HIGH (ref 0.61–1.24)
GFR, Estimated: 56 mL/min — ABNORMAL LOW (ref >60)
Glucose, Bld: 118 mg/dL — ABNORMAL HIGH (ref 70–99)
Potassium: 3.6 mmol/L (ref 3.5–5.1)
Sodium: 142 mmol/L (ref 135–145)
Total Bilirubin: 0.3 mg/dL (ref 0.3–1.2)
Total Protein: 6.5 g/dL (ref 6.5–8.1)

## 2022-01-26 LAB — URIC ACID: Uric Acid, Serum: 7 mg/dL (ref 3.7–8.6)

## 2022-01-26 MED ORDER — PALONOSETRON HCL INJECTION 0.25 MG/5ML
0.2500 mg | Freq: Once | INTRAVENOUS | Status: AC
  Administered 2022-01-26: 0.25 mg via INTRAVENOUS
  Filled 2022-01-26: qty 5

## 2022-01-26 MED ORDER — SODIUM CHLORIDE 0.9 % IV SOLN
10.0000 mg | Freq: Once | INTRAVENOUS | Status: AC
  Administered 2022-01-26: 10 mg via INTRAVENOUS
  Filled 2022-01-26: qty 10

## 2022-01-26 MED ORDER — SODIUM CHLORIDE 0.9% FLUSH: 10.0000 mL | INTRAVENOUS | Status: DC | PRN

## 2022-01-26 MED ORDER — HEPARIN SOD (PORK) LOCK FLUSH 100 UNIT/ML IV SOLN: 500.0000 [IU] | Freq: Once | INTRAVENOUS | Status: DC | PRN

## 2022-01-26 MED ORDER — ACETAMINOPHEN 325 MG PO TABS
650.0000 mg | ORAL_TABLET | Freq: Once | ORAL | Status: AC
  Administered 2022-01-26: 650 mg via ORAL
  Filled 2022-01-26: qty 2

## 2022-01-26 MED ORDER — SODIUM CHLORIDE 0.9 % IV SOLN
70.0000 mg/m2 | Freq: Once | INTRAVENOUS | Status: AC
  Administered 2022-01-26: 150 mg via INTRAVENOUS
  Filled 2022-01-26: qty 6

## 2022-01-26 MED ORDER — FAMOTIDINE IN NACL 20-0.9 MG/50ML-% IV SOLN
20.0000 mg | Freq: Once | INTRAVENOUS | Status: AC
  Administered 2022-01-26: 20 mg via INTRAVENOUS
  Filled 2022-01-26: qty 50

## 2022-01-26 MED ORDER — SODIUM CHLORIDE 0.9 % IV SOLN
375.0000 mg/m2 | Freq: Once | INTRAVENOUS | Status: AC
  Administered 2022-01-26: 800 mg via INTRAVENOUS
  Filled 2022-01-26: qty 50

## 2022-01-26 MED ORDER — SODIUM CHLORIDE 0.9 % IV SOLN: Freq: Once | INTRAVENOUS | Status: AC

## 2022-01-26 MED ORDER — DIPHENHYDRAMINE HCL 25 MG PO CAPS
50.0000 mg | ORAL_CAPSULE | Freq: Once | ORAL | Status: AC
  Administered 2022-01-26: 50 mg via ORAL
  Filled 2022-01-26: qty 2

## 2022-01-26 NOTE — Progress Notes (Signed)
? ? ?HEMATOLOGY/ONCOLOGY CLINIC NOTE ? ?Date of Service: 01/20/2022 ? ? ?Patient Care Team: ?[facility] as PCP - General (General Practice) ? 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 ?HISTORY OF PRESENTING ILLNESS:  ?Please see previous note for details of initial presentation ? ?INTERVAL HISTORY ? ?Jerry Tran Is for continued evaluation and management of his mantle cell lymphoma. ?He has completed current his first dose of Rituxan and second cycle of Bendamustine Rituxan.  He reports no significant toxicities after his last treatment at this time. ?Labs reviewed in detail. ?No other acute new symptoms. ?No fevers no chills no night sweats.  No obvious bleeding issues. ? ?MEDICAL HISTORY:  ?Past Medical History:  ?Diagnosis Date  ? Arthritis   ? Chronic kidney disease, stage 3b ([place]) 12/09/2021  ? Dyslipidemia   ? GERD (gastroesophageal reflux disease)   ? Hypertension   ? PAF (paroxysmal atrial fibrillation) ([facility])   ? Prostate cancer ([facility]) 03/2021  ? s/p radiation therapy  ? PTSD (post-traumatic stress disorder)   ? ? ?SURGICAL HISTORY: ?Past Surgical History:  ?Procedure Laterality Date  ? LIPOMA EXCISION Right 12/09/2021  ? Procedure: RIGHT SUPERCLAVICULAR LYMPH NODE EXCISION;  Surgeon: Ileana Roup, MD;  Location: [facility];  Service: General;  Laterality: Right;  ? ? ?SOCIAL HISTORY: ?Social History  ? ?Socioeconomic History  ? Marital status: Married  ?  Spouse name: Not on file  ? Number of children: Not on file  ? Years of education: Not on file  ? Highest education level: Not on file  ?Occupational History  ? Occupation: retired  ?Tobacco Use  ? Smoking status: Former  ?  Packs/day: 1.00  ?  Years: 15.00  ?  Pack years: 15.00  ?  Types: Cigarettes  ? Smokeless tobacco: Never  ?Substance and Sexual Activity  ? Alcohol use: Not Currently  ?  Comment: h/o heavy use  ? Drug use: Not Currently  ?   Types: Marijuana, Cocaine  ?  Comment: remote  ? Sexual activity: Not on file  ?Other Topics Concern  ? Not on file  ?Social History Narrative  ? Not on file  ? ?Social Determinants of Health  ? ?Financial Resource Strain: Not on file  ?Food Insecurity: Not on file  ?Transportation Needs: Not on file  ?Physical Activity: Not on file  ?Stress: Not on file  ?Social Connections: Not on file  ?Intimate Partner Violence: Not on file  ? ? ?FAMILY HISTORY: ?Family History  ?Problem Relation Age of Onset  ? Cancer Neg Hx   ? ? ?ALLERGIES:  is allergic to sildenafil. ? ?MEDICATIONS:  ?Current Outpatient Medications  ?Medication Sig Dispense Refill  ? acyclovir (ZOVIRAX) 400 MG tablet Take 1 tablet (400 mg total) by mouth daily. 30 tablet 3  ? allopurinol (ZYLOPRIM) 100 MG tablet TAKE 1 TABLET(100 MG) BY MOUTH TWICE DAILY 180 tablet 0  ? ammonium lactate (LAC-HYDRIN) 12 % lotion Apply 1 application topically 2 (two) times daily as needed for dry skin.    ? atorvastatin (LIPITOR) 20 MG tablet Take 10 mg by mouth at bedtime.    ? Cholecalciferol (VITAMIN D3 PO) Take 1 tablet by mouth every morning.    ? dexamethasone (DECADRON) 4 MG tablet Take 2 tablets (8 mg total) by mouth daily. Start the day after bendamustine chemotherapy for 2 days. Take with food. 30 tablet 1  ? diclofenac Sodium (VOLTAREN) 1 % GEL  Apply 2 g topically 4 (four) times daily as needed (pain).    ? feeding supplement (ENSURE ENLIVE / ENSURE PLUS) LIQD Take 237 mLs by mouth 2 (two) times daily between meals. 237 mL 12  ? flecainide (TAMBOCOR) 100 MG tablet Take 100 mg by mouth every 12 (twelve) hours.    ? fluticasone (FLONASE) 50 MCG/ACT nasal spray Place 1 spray into both nostrils daily as needed for allergies or rhinitis.    ? hydroxypropyl methylcellulose / hypromellose (ISOPTO TEARS / GONIOVISC) 2.5 % ophthalmic solution Place 2 drops into both eyes 2 (two) times daily as needed for dry eyes.    ? LORazepam (ATIVAN) 0.5 MG tablet Take 1 tablet (0.5 mg  total) by mouth every 6 (six) hours as needed (Nausea or vomiting). 30 tablet 0  ? methocarbamol (ROBAXIN) 500 MG tablet Take 500 mg by mouth every 8 (eight) hours as needed for muscle spasms.    ? metoprolol tartrate (LOPRESSOR) 25 MG tablet Take 12.5 mg by mouth 2 (two) times daily. Take with flecainide    ? omeprazole (PRILOSEC) 20 MG capsule Take 20 mg by mouth daily before breakfast.    ? ondansetron (ZOFRAN) 8 MG tablet Take 1 tablet (8 mg total) by mouth 2 (two) times daily as needed for refractory nausea / vomiting. Start on day 2 after bendamustine chemo. 30 tablet 1  ? prazosin (MINIPRESS) 2 MG capsule Take 6 mg by mouth at bedtime.    ? prochlorperazine (COMPAZINE) 10 MG tablet Take 1 tablet (10 mg total) by mouth every 6 (six) hours as needed (Nausea or vomiting). 30 tablet 1  ? terbinafine (LAMISIL) 1 % cream Apply 1 application topically 2 (two) times daily as needed (athlete's foot).    ? torsemide (DEMADEX) 10 MG tablet Take 2 tablets (20 mg total) by mouth daily. 15 tablet 0  ? triamcinolone cream (KENALOG) 0.1 % Apply 1 application topically 2 (two) times daily as needed (rash).    ? ?No current facility-administered medications for this visit.  ? ? ?REVIEW OF SYSTEMS:   ? ?10 Point review of Systems was done is negative except as noted above. ?PHYSICAL EXAMINATION: ?NAD ?GENERAL:alert, in no acute distress and comfortable ?SKIN: no acute rashes, no significant lesions ?EYES: conjunctiva are pink and non-injected, sclera anicteric ?OROPHARYNX: MMM, no exudates, no oropharyngeal erythema or ulceration ?NECK: supple, no JVD ?LYMPH:  no palpable lymphadenopathy in the cervical, axillary or inguinal regions ?LUNGS: clear to auscultation b/l with normal respiratory effort ?HEART: regular rate & rhythm ?ABDOMEN:  normoactive bowel sounds , non tender, not distended. ?Extremity: Bilateral 1+ pedal edema ?PSYCH: alert & oriented x 3 with fluent speech ?NEURO: no focal motor/sensory  deficits ? ? ? ?LABORATORY DATA:  ?I have reviewed the data as listed ? ?. ? ?  Latest Ref Rng & Units 01/26/2022  ?  8:14 AM 01/20/2022  ? 10:10 AM 01/13/2022  ? 10:05 AM  ?CBC  ?WBC 4.0 - 10.5 K/uL 2.4   3.9   0.8    ?Hemoglobin 13.0 - 17.0 g/dL 9.7   8.9   7.5    ?Hematocrit 39.0 - 52.0 % 30.0   28.0   23.6    ?Platelets 150 - 400 K/uL 222   130   76    ? ? ?  Latest Ref Rng & Units 01/26/2022  ?  8:14 AM 01/20/2022  ? 10:10 AM 01/13/2022  ? 10:05 AM  ?CMP  ?Glucose 70 - 99 mg/dL 118  99   98    ?BUN 8 - 23 mg/dL '26   19   25    '$ ?Creatinine 0.61 - 1.24 mg/dL 1.32   1.21   1.23    ?Sodium 135 - 145 mmol/L 142   143   138    ?Potassium 3.5 - 5.1 mmol/L 3.6   3.4   3.6    ?Chloride 98 - 111 mmol/L 106   108   107    ?CO2 22 - 32 mmol/L '30   30   26    '$ ?Calcium 8.9 - 10.3 mg/dL 9.5   8.9   8.7    ?Total Protein 6.5 - 8.1 g/dL 6.5   6.0   6.6    ?Total Bilirubin 0.3 - 1.2 mg/dL 0.3   0.4   0.4    ?Alkaline Phos 38 - 126 U/L 69   66   60    ?AST 15 - 41 U/L '17   13   17    '$ ?ALT 0 - 44 U/L '10   9   15    '$ ? ?Uric acid 4.9 ?Surgical Pathology  ?CASE: [id number]  ?PATIENT: Jerry Tran  ?Flow Pathology Report  ? ? ? ? ?Clinical history: Mantle Cell Lymphoma of lymph nodes of multiple  ?regions  ? ? ? ? ?DIAGNOSIS:  ? ?-Monoclonal B-cell population identified  ?-See comment  ? ?COMMENT:  ? ?The findings are consistent with involvement by previously known mantle  ?cell lymphoma.  ? ? ?RADIOGRAPHIC STUDIES: ?I have personally reviewed the radiological images as listed and agreed with the findings in the report. ?NM PET Image Initial (PI) Whole Body ? ?Result Date: 12/29/2021 ?CLINICAL DATA:  Initial treatment strategy for mantle cell lymphoma. EXAM: NUCLEAR MEDICINE PET WHOLE BODY TECHNIQUE: 11.59 mCi F-18 FDG was injected intravenously. Full-ring PET imaging was performed from the head to foot after the radiotracer. CT data was obtained and used for attenuation correction and anatomic localization. Fasting blood glucose:  mg/dl  COMPARISON:  None. FINDINGS: Mediastinal blood pool activity: SUV max 2.3 HEAD/NECK: Bilateral mildly enlarged and hypermetabolic cervical lymph nodes. Reference right level 2A lymph node measuring 1.4 cm in short axis on s

## 2022-01-26 NOTE — Patient Instructions (Signed)
[facility]  Discharge Instructions: ?Thank you for choosing [facility] to provide your oncology and hematology care.  ? ?If you have a lab appointment with the [facility], please go directly to the [facility] and check in at the registration area. ?  ?Wear comfortable clothing and clothing appropriate for easy access to any Portacath or PICC line.  ? ?We strive to give you quality time with your provider. You may need to reschedule your appointment if you arrive late (15 or more minutes).  Arriving late affects you and other patients whose appointments are after yours.  Also, if you miss three or more appointments without notifying the office, you may be dismissed from the clinic at the provider?s discretion.    ?  ?For prescription refill requests, have your pharmacy contact our office and allow 72 hours for refills to be completed.   ? ?Today you received the following chemotherapy and/or immunotherapy agents: Ruxience & Bendeka    ?  ?To help prevent nausea and vomiting after your treatment, we encourage you to take your nausea medication as directed. ? ?BELOW ARE SYMPTOMS THAT SHOULD BE REPORTED IMMEDIATELY: ?*FEVER GREATER THAN 100.4 F (38 ?C) OR HIGHER ?*CHILLS OR SWEATING ?*NAUSEA AND VOMITING THAT IS NOT CONTROLLED WITH YOUR NAUSEA MEDICATION ?*UNUSUAL SHORTNESS OF BREATH ?*UNUSUAL BRUISING OR BLEEDING ?*URINARY PROBLEMS (pain or burning when urinating, or frequent urination) ?*BOWEL PROBLEMS (unusual diarrhea, constipation, pain near the anus) ?TENDERNESS IN MOUTH AND THROAT WITH OR WITHOUT PRESENCE OF ULCERS (sore throat, sores in mouth, or a toothache) ?UNUSUAL RASH, SWELLING OR PAIN  ?UNUSUAL VAGINAL DISCHARGE OR ITCHING  ? ?Items with * indicate a potential emergency and should be followed up as soon as possible or go to the Emergency Department if any problems should occur. ? ?Please show the CHEMOTHERAPY ALERT CARD or IMMUNOTHERAPY ALERT CARD at  check-in to the Emergency Department and triage nurse. ? ?Should you have questions after your visit or need to cancel or reschedule your appointment, please contact [facility]  Dept: [phone]  and follow the prompts.  Office hours are 8:00 a.m. to 4:30 p.m. Monday - Friday. Please note that voicemails left after 4:00 p.m. may not be returned until the following business day.  We are closed weekends and major holidays. You have access to a nurse at all times for urgent questions. Please call the main number to the clinic Dept: [phone] and follow the prompts. ? ? ?For any non-urgent questions, you may also contact your provider using MyChart. We now offer e-Visits for anyone 69 and older to request care online for non-urgent symptoms. For details visit [email or web address]. ?  ?Also download the MyChart app! Go to the app store, search "MyChart", open the app, select [facility], and log in with your MyChart username and password. ? ?Due to Covid, a mask is required upon entering the hospital/clinic. If you do not have a mask, one will be given to you upon arrival. For doctor visits, patients may have 1 support person aged 23 or older with them. For treatment visits, patients cannot have anyone with them due to current Covid guidelines and our immunocompromised population.  ? ?

## 2022-01-26 NOTE — Progress Notes (Addendum)
? ? ?HEMATOLOGY/ONCOLOGY CLINIC NOTE ? ?Date of Service: 01/26/2022 ? ? ?Patient Care Team: ?[facility] as PCP - General (General Practice) ? ?CHIEF COMPLAINTS/PURPOSE OF CONSULTATION:  ?Continued evaluation and management of mantle cell lymphoma ? ?HISTORY OF PRESENTING ILLNESS:  ?Please see previous note for details of initial presentation ? ?INTERVAL HISTORY ?Jerry Tran is a 76 y.o. male here for continued evaluation and management of his mantle cell lymphoma. He reports he is doing well. He presents today accompanied by his wife. ? ?He is tolerating his second cycle of Rituxan/bendamustine well. ? ?He notes that he says he has been experiencing some itching along his back and has been scratching himself. He says that he has been taking Claritin daily. We dicussed taking him off the allopurinol due to itching. We discussed reducing hot water showers, keeping skin well moisturized, and continuing to take Claritin. ? ?We discussed continuing to improve hydration and consume at least 2 L of water daily to keep his kidneys flushed. ? ?We discussed increasing Bendamustine to '70mg'$ /m2. ? ?Labs done today reviewed with the patient.Marland Kitchen ?CBC shows hemoglobin of 9.7, WBC count of 2.4k platelets of 222k  ?CMP shows creatinine of 1.32. otherwise unremarkable ?Uric acid 7.0 ? ? ?MEDICAL HISTORY:  ?Past Medical History:  ?Diagnosis Date  ? Arthritis   ? Chronic kidney disease, stage 3b ([facility]) 12/09/2021  ? Dyslipidemia   ? GERD (gastroesophageal reflux disease)   ? Hypertension   ? PAF (paroxysmal atrial fibrillation) ([facility])   ? Prostate cancer ([facility]) 03/2021  ? s/p radiation therapy  ? PTSD (post-traumatic stress disorder)   ? ? ?SURGICAL HISTORY: ?Past Surgical History:  ?Procedure Laterality Date  ? LIPOMA EXCISION Right 12/09/2021  ? Procedure: RIGHT SUPERCLAVICULAR LYMPH NODE EXCISION;  Surgeon: Ileana Roup, MD;  Location: [facility];  Service: General;  Laterality: Right;  ? ? ?SOCIAL HISTORY: ?Social  History  ? ?Socioeconomic History  ? Marital status: Married  ?  Spouse name: Not on file  ? Number of children: Not on file  ? Years of education: Not on file  ? Highest education level: Not on file  ?Occupational History  ? Occupation: retired  ?Tobacco Use  ? Smoking status: Former  ?  Packs/day: 1.00  ?  Years: 15.00  ?  Pack years: 15.00  ?  Types: Cigarettes  ? Smokeless tobacco: Never  ?Substance and Sexual Activity  ? Alcohol use: Not Currently  ?  Comment: h/o heavy use  ? Drug use: Not Currently  ?  Types: Marijuana, Cocaine  ?  Comment: remote  ? Sexual activity: Not on file  ?Other Topics Concern  ? Not on file  ?Social History Narrative  ? Not on file  ? ?Social Determinants of Health  ? ?Financial Resource Strain: Not on file  ?Food Insecurity: Not on file  ?Transportation Needs: Not on file  ?Physical Activity: Not on file  ?Stress: Not on file  ?Social Connections: Not on file  ?Intimate Partner Violence: Not on file  ? ? ?FAMILY HISTORY: ?Family History  ?Problem Relation Age of Onset  ? Cancer Neg Hx   ? ? ?ALLERGIES:  is allergic to sildenafil. ? ?MEDICATIONS:  ?Current Outpatient Medications  ?Medication Sig Dispense Refill  ? acyclovir (ZOVIRAX) 400 MG tablet Take 1 tablet (400 mg total) by mouth daily. 30 tablet 3  ? allopurinol (ZYLOPRIM) 100 MG tablet TAKE 1 TABLET(100 MG) BY MOUTH TWICE DAILY 180 tablet 0  ? ammonium lactate (LAC-HYDRIN) 12 %  lotion Apply 1 application topically 2 (two) times daily as needed for dry skin.    ? atorvastatin (LIPITOR) 20 MG tablet Take 10 mg by mouth at bedtime.    ? Cholecalciferol (VITAMIN D3 PO) Take 1 tablet by mouth every morning.    ? dexamethasone (DECADRON) 4 MG tablet Take 2 tablets (8 mg total) by mouth daily. Start the day after bendamustine chemotherapy for 2 days. Take with food. 30 tablet 1  ? diclofenac Sodium (VOLTAREN) 1 % GEL Apply 2 g topically 4 (four) times daily as needed (pain).    ? feeding supplement (ENSURE ENLIVE / ENSURE PLUS) LIQD  Take 237 mLs by mouth 2 (two) times daily between meals. 237 mL 12  ? flecainide (TAMBOCOR) 100 MG tablet Take 100 mg by mouth every 12 (twelve) hours.    ? fluticasone (FLONASE) 50 MCG/ACT nasal spray Place 1 spray into both nostrils daily as needed for allergies or rhinitis.    ? hydroxypropyl methylcellulose / hypromellose (ISOPTO TEARS / GONIOVISC) 2.5 % ophthalmic solution Place 2 drops into both eyes 2 (two) times daily as needed for dry eyes.    ? LORazepam (ATIVAN) 0.5 MG tablet Take 1 tablet (0.5 mg total) by mouth every 6 (six) hours as needed (Nausea or vomiting). 30 tablet 0  ? methocarbamol (ROBAXIN) 500 MG tablet Take 500 mg by mouth every 8 (eight) hours as needed for muscle spasms.    ? metoprolol tartrate (LOPRESSOR) 25 MG tablet Take 12.5 mg by mouth 2 (two) times daily. Take with flecainide    ? omeprazole (PRILOSEC) 20 MG capsule Take 20 mg by mouth daily before breakfast.    ? ondansetron (ZOFRAN) 8 MG tablet Take 1 tablet (8 mg total) by mouth 2 (two) times daily as needed for refractory nausea / vomiting. Start on day 2 after bendamustine chemo. 30 tablet 1  ? prazosin (MINIPRESS) 2 MG capsule Take 6 mg by mouth at bedtime.    ? prochlorperazine (COMPAZINE) 10 MG tablet Take 1 tablet (10 mg total) by mouth every 6 (six) hours as needed (Nausea or vomiting). 30 tablet 1  ? terbinafine (LAMISIL) 1 % cream Apply 1 application topically 2 (two) times daily as needed (athlete's foot).    ? torsemide (DEMADEX) 10 MG tablet Take 2 tablets (20 mg total) by mouth daily. 15 tablet 0  ? triamcinolone cream (KENALOG) 0.1 % Apply 1 application topically 2 (two) times daily as needed (rash).    ? ?No current facility-administered medications for this visit.  ? ? ?REVIEW OF SYSTEMS:   ? ?10 Point review of Systems was done is negative except as noted above. ? ?PHYSICAL EXAMINATION: ?ECOG PERFORMANCE STATUS: 2 - Symptomatic, <50% confined to bed ? ?GENERAL:alert, in no acute distress and comfortable ?SKIN: no  acute rashes, no significant lesions ?EYES: conjunctiva are pink and non-injected, sclera anicteric ?NECK: supple, no JVD ?LYMPH:  no palpable lymphadenopathy in the cervical, axillary or inguinal regions ?LUNGS: clear to auscultation b/l with normal respiratory effort ?HEART: regular rate & rhythm ?ABDOMEN:  normoactive bowel sounds , non tender, not distended. ?Extremity: grade 1 edema in legs b/l. ?PSYCH: alert & oriented x 3 with fluent speech ?NEURO: no focal motor/sensory deficits ? ? ?LABORATORY DATA:  ?I have reviewed the data as listed ? ?. ? ?  Latest Ref Rng & Units 01/26/2022  ?  8:14 AM 01/20/2022  ? 10:10 AM 01/13/2022  ? 10:05 AM  ?CBC  ?WBC 4.0 - 10.5 K/uL 2.4  3.9   0.8    ?Hemoglobin 13.0 - 17.0 g/dL 9.7   8.9   7.5    ?Hematocrit 39.0 - 52.0 % 30.0   28.0   23.6    ?Platelets 150 - 400 K/uL 222   130   76    ? ? ?  Latest Ref Rng & Units 01/26/2022  ?  8:14 AM 01/20/2022  ? 10:10 AM 01/13/2022  ? 10:05 AM  ?CMP  ?Glucose 70 - 99 mg/dL 118   99   98    ?BUN 8 - 23 mg/dL '26   19   25    '$ ?Creatinine 0.61 - 1.24 mg/dL 1.32   1.21   1.23    ?Sodium 135 - 145 mmol/L 142   143   138    ?Potassium 3.5 - 5.1 mmol/L 3.6   3.4   3.6    ?Chloride 98 - 111 mmol/L 106   108   107    ?CO2 22 - 32 mmol/L '30   30   26    '$ ?Calcium 8.9 - 10.3 mg/dL 9.5   8.9   8.7    ?Total Protein 6.5 - 8.1 g/dL 6.5   6.0   6.6    ?Total Bilirubin 0.3 - 1.2 mg/dL 0.3   0.4   0.4    ?Alkaline Phos 38 - 126 U/L 69   66   60    ?AST 15 - 41 U/L '17   13   17    '$ ?ALT 0 - 44 U/L '10   9   15    '$ ? ?Uric acid 4.9 ?Surgical Pathology  ?CASE: [id number]  ?PATIENT: Aundray Hansman  ?Flow Pathology Report  ? ? ? ? ?Clinical history: Mantle Cell Lymphoma of lymph nodes of multiple  ?regions  ? ? ? ? ?DIAGNOSIS:  ? ?-Monoclonal B-cell population identified  ?-See comment  ? ?COMMENT:  ? ?The findings are consistent with involvement by previously known mantle  ?cell lymphoma.  ? ? ?RADIOGRAPHIC STUDIES: ?I have personally reviewed the radiological  images as listed and agreed with the findings in the report. ?NM PET Image Initial (PI) Whole Body ? ?Result Date: 12/29/2021 ?CLINICAL DATA:  Initial treatment strategy for mantle cell lymphoma. EXAM: NUCLEAR M

## 2022-01-27 ENCOUNTER — Inpatient Hospital Stay: Payer: Medicare Other

## 2022-01-27 ENCOUNTER — Encounter: Payer: Self-pay | Admitting: Hematology

## 2022-01-27 VITALS — BP 105/53 | HR 75 | Temp 98.6°F | Resp 16

## 2022-01-27 DIAGNOSIS — C8318 Mantle cell lymphoma, lymph nodes of multiple sites: Secondary | ICD-10-CM | POA: Diagnosis not present

## 2022-01-27 DIAGNOSIS — Z7189 Other specified counseling: Secondary | ICD-10-CM

## 2022-01-27 MED ORDER — LORATADINE 10 MG PO TABS
10.0000 mg | ORAL_TABLET | Freq: Once | ORAL | Status: AC
  Administered 2022-01-27: 10 mg via ORAL
  Filled 2022-01-27: qty 1

## 2022-01-27 MED ORDER — SODIUM CHLORIDE 0.9 % IV SOLN
10.0000 mg | Freq: Once | INTRAVENOUS | Status: AC
  Administered 2022-01-27: 10 mg via INTRAVENOUS
  Filled 2022-01-27: qty 10

## 2022-01-27 MED ORDER — SODIUM CHLORIDE 0.9 % IV SOLN
70.0000 mg/m2 | Freq: Once | INTRAVENOUS | Status: AC
  Administered 2022-01-27: 150 mg via INTRAVENOUS
  Filled 2022-01-27: qty 6

## 2022-01-27 MED ORDER — SODIUM CHLORIDE 0.9 % IV SOLN: Freq: Once | INTRAVENOUS | Status: AC

## 2022-01-27 NOTE — Patient Instructions (Signed)
[facility]   ?Discharge Instructions: ?Thank you for choosing [facility] to provide your oncology and hematology care.  ? ?If you have a lab appointment with the [facility], please go directly to the [facility] and check in at the registration area. ?  ?Wear comfortable clothing and clothing appropriate for easy access to any Portacath or PICC line.  ? ?We strive to give you quality time with your provider. You may need to reschedule your appointment if you arrive late (15 or more minutes).  Arriving late affects you and other patients whose appointments are after yours.  Also, if you miss three or more appointments without notifying the office, you may be dismissed from the clinic at the provider?s discretion.    ?  ?For prescription refill requests, have your pharmacy contact our office and allow 72 hours for refills to be completed.   ? ?Today you received the following chemotherapy and/or immunotherapy agents: Bendamustine Verl Dicker)     ?  ?To help prevent nausea and vomiting after your treatment, we encourage you to take your nausea medication as directed. ? ?BELOW ARE SYMPTOMS THAT SHOULD BE REPORTED IMMEDIATELY: ?*FEVER GREATER THAN 100.4 F (38 ?C) OR HIGHER ?*CHILLS OR SWEATING ?*NAUSEA AND VOMITING THAT IS NOT CONTROLLED WITH YOUR NAUSEA MEDICATION ?*UNUSUAL SHORTNESS OF BREATH ?*UNUSUAL BRUISING OR BLEEDING ?*URINARY PROBLEMS (pain or burning when urinating, or frequent urination) ?*BOWEL PROBLEMS (unusual diarrhea, constipation, pain near the anus) ?TENDERNESS IN MOUTH AND THROAT WITH OR WITHOUT PRESENCE OF ULCERS (sore throat, sores in mouth, or a toothache) ?UNUSUAL RASH, SWELLING OR PAIN  ?UNUSUAL VAGINAL DISCHARGE OR ITCHING  ? ?Items with * indicate a potential emergency and should be followed up as soon as possible or go to the Emergency Department if any problems should occur. ? ?Please show the CHEMOTHERAPY ALERT CARD or IMMUNOTHERAPY ALERT CARD  at check-in to the Emergency Department and triage nurse. ? ?Should you have questions after your visit or need to cancel or reschedule your appointment, please contact [facility]  Dept: [phone]  and follow the prompts.  Office hours are 8:00 a.m. to 4:30 p.m. Monday - Friday. Please note that voicemails left after 4:00 p.m. may not be returned until the following business day.  We are closed weekends and major holidays. You have access to a nurse at all times for urgent questions. Please call the main number to the clinic Dept: [phone] and follow the prompts. ? ? ?For any non-urgent questions, you may also contact your provider using MyChart. We now offer e-Visits for anyone 13 and older to request care online for non-urgent symptoms. For details visit [email or web address]. ?  ?Also download the MyChart app! Go to the app store, search "MyChart", open the app, select [facility], and log in with your MyChart username and password. ? ?Due to Covid, a mask is required upon entering the hospital/clinic. If you do not have a mask, one will be given to you upon arrival. For doctor visits, patients may have 1 support person aged 32 or older with them. For treatment visits, patients cannot have anyone with them due to current Covid guidelines and our immunocompromised population.  ? ?

## 2022-01-30 ENCOUNTER — Inpatient Hospital Stay: Payer: Medicare Other | Attending: Hematology and Oncology

## 2022-01-30 ENCOUNTER — Other Ambulatory Visit: Payer: Self-pay

## 2022-01-30 VITALS — BP 102/64 | HR 79 | Temp 98.1°F | Resp 18

## 2022-01-30 DIAGNOSIS — E43 Unspecified severe protein-calorie malnutrition: Secondary | ICD-10-CM | POA: Insufficient documentation

## 2022-01-30 DIAGNOSIS — C8318 Mantle cell lymphoma, lymph nodes of multiple sites: Secondary | ICD-10-CM | POA: Diagnosis present

## 2022-01-30 DIAGNOSIS — D6959 Other secondary thrombocytopenia: Secondary | ICD-10-CM | POA: Diagnosis not present

## 2022-01-30 DIAGNOSIS — D630 Anemia in neoplastic disease: Secondary | ICD-10-CM | POA: Insufficient documentation

## 2022-01-30 DIAGNOSIS — Z5189 Encounter for other specified aftercare: Secondary | ICD-10-CM | POA: Diagnosis not present

## 2022-01-30 DIAGNOSIS — Z5112 Encounter for antineoplastic immunotherapy: Secondary | ICD-10-CM | POA: Diagnosis present

## 2022-01-30 DIAGNOSIS — Z87891 Personal history of nicotine dependence: Secondary | ICD-10-CM | POA: Insufficient documentation

## 2022-01-30 DIAGNOSIS — Z7189 Other specified counseling: Secondary | ICD-10-CM

## 2022-01-30 MED ORDER — PEGFILGRASTIM-CBQV 6 MG/0.6ML ~~LOC~~ SOSY
6.0000 mg | PREFILLED_SYRINGE | Freq: Once | SUBCUTANEOUS | Status: AC
  Administered 2022-01-30: 6 mg via SUBCUTANEOUS
  Filled 2022-01-30: qty 0.6

## 2022-01-30 NOTE — Patient Instructions (Signed)

## 2022-01-31 ENCOUNTER — Encounter: Payer: Self-pay | Admitting: Hematology

## 2022-02-02 ENCOUNTER — Encounter: Payer: Self-pay | Admitting: Hematology

## 2022-02-02 NOTE — Addendum Note (Signed)
Addended by: Sullivan Lone on: 02/02/2022 01:05 AM ? ? Modules accepted: Orders ? ?

## 2022-02-20 ENCOUNTER — Other Ambulatory Visit: Payer: Self-pay

## 2022-02-20 DIAGNOSIS — C8318 Mantle cell lymphoma, lymph nodes of multiple sites: Secondary | ICD-10-CM

## 2022-02-22 ENCOUNTER — Other Ambulatory Visit: Payer: Self-pay

## 2022-02-22 ENCOUNTER — Inpatient Hospital Stay (HOSPITAL_BASED_OUTPATIENT_CLINIC_OR_DEPARTMENT_OTHER): Payer: Medicare Other | Admitting: Hematology

## 2022-02-22 ENCOUNTER — Inpatient Hospital Stay: Payer: Medicare Other

## 2022-02-22 VITALS — BP 111/70 | HR 75 | Temp 97.2°F | Resp 20 | Wt 200.6 lb

## 2022-02-22 VITALS — BP 150/90 | HR 68 | Temp 97.9°F | Resp 18 | Wt 200.4 lb

## 2022-02-22 DIAGNOSIS — L299 Pruritus, unspecified: Secondary | ICD-10-CM

## 2022-02-22 DIAGNOSIS — Z5111 Encounter for antineoplastic chemotherapy: Secondary | ICD-10-CM

## 2022-02-22 DIAGNOSIS — Z5112 Encounter for antineoplastic immunotherapy: Secondary | ICD-10-CM | POA: Diagnosis not present

## 2022-02-22 DIAGNOSIS — D696 Thrombocytopenia, unspecified: Secondary | ICD-10-CM | POA: Diagnosis not present

## 2022-02-22 DIAGNOSIS — D649 Anemia, unspecified: Secondary | ICD-10-CM | POA: Diagnosis not present

## 2022-02-22 DIAGNOSIS — C8318 Mantle cell lymphoma, lymph nodes of multiple sites: Secondary | ICD-10-CM

## 2022-02-22 DIAGNOSIS — Z7189 Other specified counseling: Secondary | ICD-10-CM

## 2022-02-22 LAB — CMP (CANCER CENTER ONLY)
ALT: 10 U/L (ref 0–44)
AST: 17 U/L (ref 15–41)
Albumin: 3.4 g/dL — ABNORMAL LOW (ref 3.5–5.0)
Alkaline Phosphatase: 48 U/L (ref 38–126)
Anion gap: 3 — ABNORMAL LOW (ref 5–15)
BUN: 21 mg/dL (ref 8–23)
CO2: 31 mmol/L (ref 22–32)
Calcium: 9.4 mg/dL (ref 8.9–10.3)
Chloride: 108 mmol/L (ref 98–111)
Creatinine: 1.11 mg/dL (ref 0.61–1.24)
GFR, Estimated: >60 mL/min (ref >60)
Glucose, Bld: 108 mg/dL — ABNORMAL HIGH (ref 70–99)
Potassium: 3.3 mmol/L — ABNORMAL LOW (ref 3.5–5.1)
Sodium: 142 mmol/L (ref 135–145)
Total Bilirubin: 0.4 mg/dL (ref 0.3–1.2)
Total Protein: 7.2 g/dL (ref 6.5–8.1)

## 2022-02-22 LAB — CBC WITH DIFFERENTIAL (CANCER CENTER ONLY)
Abs Immature Granulocytes: 0.01 10*3/uL (ref 0.00–0.07)
Basophils Absolute: 0 10*3/uL (ref 0.0–0.1)
Basophils Relative: 1 %
Eosinophils Absolute: 0.1 10*3/uL (ref 0.0–0.5)
Eosinophils Relative: 4 %
HCT: 27.2 % — ABNORMAL LOW (ref 39.0–52.0)
Hemoglobin: 8.6 g/dL — ABNORMAL LOW (ref 13.0–17.0)
Immature Granulocytes: 0 %
Lymphocytes Relative: 39 %
Lymphs Abs: 0.9 10*3/uL (ref 0.7–4.0)
MCH: 29.2 pg (ref 26.0–34.0)
MCHC: 31.6 g/dL (ref 30.0–36.0)
MCV: 92.2 fL (ref 80.0–100.0)
Monocytes Absolute: 0.2 10*3/uL (ref 0.1–1.0)
Monocytes Relative: 9 %
Neutro Abs: 1.1 10*3/uL — ABNORMAL LOW (ref 1.7–7.7)
Neutrophils Relative %: 47 %
Platelet Count: 116 10*3/uL — ABNORMAL LOW (ref 150–400)
RBC: 2.95 MIL/uL — ABNORMAL LOW (ref 4.22–5.81)
RDW: 15.9 % — ABNORMAL HIGH (ref 11.5–15.5)
WBC Count: 2.3 10*3/uL — ABNORMAL LOW (ref 4.0–10.5)
nRBC: 0 % (ref 0.0–0.2)

## 2022-02-22 LAB — URIC ACID: Uric Acid, Serum: 9.4 mg/dL — ABNORMAL HIGH (ref 3.7–8.6)

## 2022-02-22 MED ORDER — SODIUM CHLORIDE 0.9 % IV SOLN: Freq: Once | INTRAVENOUS | Status: AC

## 2022-02-22 MED ORDER — DIPHENHYDRAMINE HCL 25 MG PO CAPS
50.0000 mg | ORAL_CAPSULE | Freq: Once | ORAL | Status: AC
  Administered 2022-02-22: 50 mg via ORAL
  Filled 2022-02-22: qty 2

## 2022-02-22 MED ORDER — SODIUM CHLORIDE 0.9 % IV SOLN
10.0000 mg | Freq: Once | INTRAVENOUS | Status: AC
  Administered 2022-02-22: 10 mg via INTRAVENOUS
  Filled 2022-02-22: qty 10

## 2022-02-22 MED ORDER — PALONOSETRON HCL INJECTION 0.25 MG/5ML
0.2500 mg | Freq: Once | INTRAVENOUS | Status: AC
  Administered 2022-02-22: 0.25 mg via INTRAVENOUS
  Filled 2022-02-22: qty 5

## 2022-02-22 MED ORDER — PREDNISONE 20 MG PO TABS: 20.0000 mg | ORAL_TABLET | Freq: Every day | ORAL | 0 refills | Status: DC

## 2022-02-22 MED ORDER — SODIUM CHLORIDE 0.9 % IV SOLN
375.0000 mg/m2 | Freq: Once | INTRAVENOUS | Status: AC
  Administered 2022-02-22: 800 mg via INTRAVENOUS
  Filled 2022-02-22: qty 50

## 2022-02-22 MED ORDER — SODIUM CHLORIDE 0.9 % IV SOLN
70.0000 mg/m2 | Freq: Once | INTRAVENOUS | Status: AC
  Administered 2022-02-22: 150 mg via INTRAVENOUS
  Filled 2022-02-22: qty 6

## 2022-02-22 MED ORDER — ACETAMINOPHEN 325 MG PO TABS
650.0000 mg | ORAL_TABLET | Freq: Once | ORAL | Status: AC
  Administered 2022-02-22: 650 mg via ORAL
  Filled 2022-02-22: qty 2

## 2022-02-22 MED ORDER — FAMOTIDINE IN NACL 20-0.9 MG/50ML-% IV SOLN
20.0000 mg | Freq: Once | INTRAVENOUS | Status: AC
  Administered 2022-02-22: 20 mg via INTRAVENOUS
  Filled 2022-02-22: qty 50

## 2022-02-22 NOTE — Progress Notes (Signed)
Per Dr. Kale, ok to treat with low ANC 

## 2022-02-22 NOTE — Patient Instructions (Signed)
[facility]  Discharge Instructions: ?Thank you for choosing [facility] to provide your oncology and hematology care.  ? ?If you have a lab appointment with the [facility], please go directly to the [facility] and check in at the registration area. ?  ?Wear comfortable clothing and clothing appropriate for easy access to any Portacath or PICC line.  ? ?We strive to give you quality time with your provider. You may need to reschedule your appointment if you arrive late (15 or more minutes).  Arriving late affects you and other patients whose appointments are after yours.  Also, if you miss three or more appointments without notifying the office, you may be dismissed from the clinic at the provider?s discretion.    ?  ?For prescription refill requests, have your pharmacy contact our office and allow 72 hours for refills to be completed.   ? ?Today you received the following chemotherapy and/or immunotherapy agents bendeka, rituxan    ?  ?To help prevent nausea and vomiting after your treatment, we encourage you to take your nausea medication as directed. ? ?BELOW ARE SYMPTOMS THAT SHOULD BE REPORTED IMMEDIATELY: ?*FEVER GREATER THAN 100.4 F (38 ?C) OR HIGHER ?*CHILLS OR SWEATING ?*NAUSEA AND VOMITING THAT IS NOT CONTROLLED WITH YOUR NAUSEA MEDICATION ?*UNUSUAL SHORTNESS OF BREATH ?*UNUSUAL BRUISING OR BLEEDING ?*URINARY PROBLEMS (pain or burning when urinating, or frequent urination) ?*BOWEL PROBLEMS (unusual diarrhea, constipation, pain near the anus) ?TENDERNESS IN MOUTH AND THROAT WITH OR WITHOUT PRESENCE OF ULCERS (sore throat, sores in mouth, or a toothache) ?UNUSUAL RASH, SWELLING OR PAIN  ?UNUSUAL VAGINAL DISCHARGE OR ITCHING  ? ?Items with * indicate a potential emergency and should be followed up as soon as possible or go to the Emergency Department if any problems should occur. ? ?Please show the CHEMOTHERAPY ALERT CARD or IMMUNOTHERAPY ALERT CARD at  check-in to the Emergency Department and triage nurse. ? ?Should you have questions after your visit or need to cancel or reschedule your appointment, please contact [facility]  Dept: [phone]  and follow the prompts.  Office hours are 8:00 a.m. to 4:30 p.m. Monday - Friday. Please note that voicemails left after 4:00 p.m. may not be returned until the following business day.  We are closed weekends and major holidays. You have access to a nurse at all times for urgent questions. Please call the main number to the clinic Dept: [phone] and follow the prompts. ? ? ?For any non-urgent questions, you may also contact your provider using MyChart. We now offer e-Visits for anyone 19 and older to request care online for non-urgent symptoms. For details visit [email or web address]. ?  ?Also download the MyChart app! Go to the app store, search "MyChart", open the app, select , and log in with your MyChart username and password. ? ?Due to Covid, a mask is required upon entering the hospital/clinic. If you do not have a mask, one will be given to you upon arrival. For doctor visits, patients may have 1 support person aged 41 or older with them. For treatment visits, patients cannot have anyone with them due to current Covid guidelines and our immunocompromised population.  ? ?

## 2022-02-22 NOTE — Progress Notes (Signed)
Per Dr Irene Limbo pt is ok for tx today. Dr Irene Limbo has reviewed all labs.  ?

## 2022-02-23 ENCOUNTER — Other Ambulatory Visit: Payer: Self-pay

## 2022-02-23 ENCOUNTER — Inpatient Hospital Stay: Payer: Medicare Other

## 2022-02-23 VITALS — BP 109/58 | HR 77 | Temp 97.6°F | Resp 18 | Wt 206.2 lb

## 2022-02-23 DIAGNOSIS — Z5111 Encounter for antineoplastic chemotherapy: Secondary | ICD-10-CM

## 2022-02-23 DIAGNOSIS — Z5112 Encounter for antineoplastic immunotherapy: Secondary | ICD-10-CM | POA: Diagnosis not present

## 2022-02-23 DIAGNOSIS — Z7189 Other specified counseling: Secondary | ICD-10-CM

## 2022-02-23 DIAGNOSIS — C8318 Mantle cell lymphoma, lymph nodes of multiple sites: Secondary | ICD-10-CM

## 2022-02-23 MED ORDER — SODIUM CHLORIDE 0.9 % IV SOLN
10.0000 mg | Freq: Once | INTRAVENOUS | Status: AC
  Administered 2022-02-23: 10 mg via INTRAVENOUS
  Filled 2022-02-23: qty 10

## 2022-02-23 MED ORDER — SODIUM CHLORIDE 0.9 % IV SOLN: Freq: Once | INTRAVENOUS | Status: AC

## 2022-02-23 MED ORDER — SODIUM CHLORIDE 0.9 % IV SOLN
70.0000 mg/m2 | Freq: Once | INTRAVENOUS | Status: AC
  Administered 2022-02-23: 150 mg via INTRAVENOUS
  Filled 2022-02-23: qty 6

## 2022-02-23 NOTE — Progress Notes (Signed)
Patient complained of significant dizziness this morning when he woke up and asked to send a message to Dr. Irene Limbo. MD was contacted and he ordered an EKG which came back normal- Dr. Irene Limbo reviewed the EKG and assessed the patient in person. Patient feeling "fine" now. Dr. Irene Limbo stated to continue with treatment. ?

## 2022-02-25 ENCOUNTER — Inpatient Hospital Stay: Payer: Medicare Other

## 2022-02-25 ENCOUNTER — Encounter: Payer: Self-pay | Admitting: Hematology

## 2022-02-25 VITALS — BP 115/65 | HR 84 | Temp 97.6°F | Resp 17 | Ht 76.0 in

## 2022-02-25 DIAGNOSIS — Z5112 Encounter for antineoplastic immunotherapy: Secondary | ICD-10-CM | POA: Diagnosis not present

## 2022-02-25 DIAGNOSIS — Z7189 Other specified counseling: Secondary | ICD-10-CM

## 2022-02-25 DIAGNOSIS — C8318 Mantle cell lymphoma, lymph nodes of multiple sites: Secondary | ICD-10-CM

## 2022-02-25 MED ORDER — PEGFILGRASTIM-BMEZ 6 MG/0.6ML ~~LOC~~ SOSY
6.0000 mg | PREFILLED_SYRINGE | Freq: Once | SUBCUTANEOUS | Status: AC
  Administered 2022-02-25: 6 mg via SUBCUTANEOUS
  Filled 2022-02-25: qty 0.6

## 2022-02-25 NOTE — Patient Instructions (Signed)

## 2022-02-25 NOTE — Progress Notes (Signed)
? ? ?HEMATOLOGY/ONCOLOGY CLINIC NOTE ? ?Date of Service: 02/22/2022 ? ? ? ?Patient Care Team: ?[facility] as PCP - General (General Practice) ? 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 ?HISTORY OF PRESENTING ILLNESS:  ?Please see previous note for details of initial presentation ? ?INTERVAL HISTORY ? ?Jerry Tran is a 76 y.o. male here for continued evaluation and management of his mantle cell lymphoma and third cycle of Bendamustine Rituxan chemotherapy, ?He notes nasal allergies and mild skin rash on the neck and upper trunk likely due to external allergies. ?He has been using cetirizine plus famotidine at this time.  He was given a prescription for prednisone if needed. ?The rash has persisted and does not seem to be related to be related to the timing of his treatment administration.  No new laundry detergent or other obvious exposures. ?Labs done today were reviewed with the patient in detail. ?Eating well and has gradually gained back most of his lost weight. ?No shortness of breath or chest pain. ?No abdominal pain or distention. ?Leg swelling is resolved. ? ? ?MEDICAL HISTORY:  ?Past Medical History:  ?Diagnosis Date  ? Arthritis   ? Chronic kidney disease, stage 3b ([facility]) 12/09/2021  ? Dyslipidemia   ? GERD (gastroesophageal reflux disease)   ? Hypertension   ? PAF (paroxysmal atrial fibrillation) ([facility])   ? Prostate cancer ([place] [facility]) 03/2021  ? s/p radiation therapy  ? PTSD (post-traumatic stress disorder)   ? ? ?SURGICAL HISTORY: ?Past Surgical History:  ?Procedure Laterality Date  ? LIPOMA EXCISION Right 12/09/2021  ? Procedure: RIGHT SUPERCLAVICULAR LYMPH NODE EXCISION;  Surgeon: Ileana Roup, MD;  Location: [facility];  Service: General;  Laterality: Right;  ? ? ?SOCIAL HISTORY: ?Social History  ? ?Socioeconomic History  ? Marital status: Married  ?  Spouse name: Not on  file  ? Number of children: Not on file  ? Years of education: Not on file  ? Highest education level: Not on file  ?Occupational History  ? Occupation: retired  ?Tobacco Use  ? Smoking status: Former  ?  Packs/day: 1.00  ?  Years: 15.00  ?  Pack years: 15.00  ?  Types: Cigarettes  ? Smokeless tobacco: Never  ?Substance and Sexual Activity  ? Alcohol use: Not Currently  ?  Comment: h/o heavy use  ? Drug use: Not Currently  ?  Types: Marijuana, Cocaine  ?  Comment: remote  ? Sexual activity: Not on file  ?Other Topics Concern  ? Not on file  ?Social History Narrative  ? Not on file  ? ?Social Determinants of Health  ? ?Financial Resource Strain: Not on file  ?Food Insecurity: Not on file  ?Transportation Needs: Not on file  ?Physical Activity: Not on file  ?Stress: Not on file  ?Social Connections: Not on file  ?Intimate Partner Violence: Not on file  ? ? ?FAMILY HISTORY: ?Family History  ?Problem Relation Age of Onset  ? Cancer Neg Hx   ? ? ?ALLERGIES:  is allergic to sildenafil. ? ?MEDICATIONS:  ?Current Outpatient Medications  ?Medication Sig Dispense Refill  ? predniSONE (DELTASONE) 20 MG tablet Take 1 tablet (20 mg total) by mouth daily with breakfast. 5 tablet 0  ? acyclovir (ZOVIRAX) 400 MG tablet Take 1 tablet (400 mg total) by mouth daily. 30 tablet 3  ? ammonium lactate (LAC-HYDRIN) 12 % lotion Apply 1 application topically 2 (two) times  daily as needed for dry skin.    ? atorvastatin (LIPITOR) 20 MG tablet Take 10 mg by mouth at bedtime.    ? Cholecalciferol (VITAMIN D3 PO) Take 1 tablet by mouth every morning.    ? dexamethasone (DECADRON) 4 MG tablet Take 2 tablets (8 mg total) by mouth daily. Start the day after bendamustine chemotherapy for 2 days. Take with food. 30 tablet 1  ? diclofenac Sodium (VOLTAREN) 1 % GEL Apply 2 g topically 4 (four) times daily as needed (pain).    ? feeding supplement (ENSURE ENLIVE / ENSURE PLUS) LIQD Take 237 mLs by mouth 2 (two) times daily between meals. 237 mL 12  ?  flecainide (TAMBOCOR) 100 MG tablet Take 100 mg by mouth every 12 (twelve) hours.    ? fluticasone (FLONASE) 50 MCG/ACT nasal spray Place 1 spray into both nostrils daily as needed for allergies or rhinitis.    ? hydroxypropyl methylcellulose / hypromellose (ISOPTO TEARS / GONIOVISC) 2.5 % ophthalmic solution Place 2 drops into both eyes 2 (two) times daily as needed for dry eyes.    ? LORazepam (ATIVAN) 0.5 MG tablet Take 1 tablet (0.5 mg total) by mouth every 6 (six) hours as needed (Nausea or vomiting). 30 tablet 0  ? methocarbamol (ROBAXIN) 500 MG tablet Take 500 mg by mouth every 8 (eight) hours as needed for muscle spasms.    ? metoprolol tartrate (LOPRESSOR) 25 MG tablet Take 12.5 mg by mouth 2 (two) times daily. Take with flecainide    ? mirtazapine (REMERON) 30 MG tablet Take 30 mg by mouth at bedtime.    ? omeprazole (PRILOSEC) 20 MG capsule Take 20 mg by mouth daily before breakfast.    ? ondansetron (ZOFRAN) 8 MG tablet Take 1 tablet (8 mg total) by mouth 2 (two) times daily as needed for refractory nausea / vomiting. Start on day 2 after bendamustine chemo. 30 tablet 1  ? prazosin (MINIPRESS) 2 MG capsule Take 6 mg by mouth at bedtime.    ? prochlorperazine (COMPAZINE) 10 MG tablet Take 1 tablet (10 mg total) by mouth every 6 (six) hours as needed (Nausea or vomiting). 30 tablet 1  ? terbinafine (LAMISIL) 1 % cream Apply 1 application topically 2 (two) times daily as needed (athlete's foot).    ? torsemide (DEMADEX) 10 MG tablet Take 2 tablets (20 mg total) by mouth daily. 15 tablet 0  ? triamcinolone cream (KENALOG) 0.1 % Apply 1 application topically 2 (two) times daily as needed (rash).    ? ?No current facility-administered medications for this visit.  ? ? ?REVIEW OF SYSTEMS:   ? ?10 Point review of Systems was done is negative except as noted above. ? ?PHYSICAL EXAMINATION: ?ECOG PERFORMANCE STATUS: 2 - Symptomatic, <50% confined to bed ? ?NAD ?GENERAL:alert, in no acute distress and  comfortable ?SKIN: no acute rashes, no significant lesions ?EYES: conjunctiva are pink and non-injected, sclera anicteric ?OROPHARYNX: MMM, no exudates, no oropharyngeal erythema or ulceration ?NECK: supple, no JVD ?LYMPH:  no palpable lymphadenopathy in the cervical, axillary or inguinal regions ?LUNGS: clear to auscultation b/l with normal respiratory effort ?HEART: regular rate & rhythm ?ABDOMEN:  normoactive bowel sounds , non tender, not distended. ?Extremity: no pedal edema ?PSYCH: alert & oriented x 3 with fluent speech ?NEURO: no focal motor/sensory deficits ? ? ?LABORATORY DATA:  ?I have reviewed the data as listed ? ?. ? ?  Latest Ref Rng & Units 02/22/2022  ? 10:03 AM 01/26/2022  ?  8:14  AM 01/20/2022  ? 10:10 AM  ?CBC  ?WBC 4.0 - 10.5 K/uL 2.3   2.4   3.9    ?Hemoglobin 13.0 - 17.0 g/dL 8.6   9.7   8.9    ?Hematocrit 39.0 - 52.0 % 27.2   30.0   28.0    ?Platelets 150 - 400 K/uL 116   222   130    ? ? ? ?  Latest Ref Rng & Units 02/22/2022  ? 10:03 AM 01/26/2022  ?  8:14 AM 01/20/2022  ? 10:10 AM  ?CMP  ?Glucose 70 - 99 mg/dL 108   118   99    ?BUN 8 - 23 mg/dL '21   26   19    '$ ?Creatinine 0.61 - 1.24 mg/dL 1.11   1.32   1.21    ?Sodium 135 - 145 mmol/L 142   142   143    ?Potassium 3.5 - 5.1 mmol/L 3.3   3.6   3.4    ?Chloride 98 - 111 mmol/L 108   106   108    ?CO2 22 - 32 mmol/L '31   30   30    '$ ?Calcium 8.9 - 10.3 mg/dL 9.4   9.5   8.9    ?Total Protein 6.5 - 8.1 g/dL 7.2   6.5   6.0    ?Total Bilirubin 0.3 - 1.2 mg/dL 0.4   0.3   0.4    ?Alkaline Phos 38 - 126 U/L 48   69   66    ?AST 15 - 41 U/L '17   17   13    '$ ?ALT 0 - 44 U/L '10   10   9    '$ ? ?Uric acid 4.9 ?Surgical Pathology  ?CASE: [id number]  ?PATIENT: Christie Maund  ?Flow Pathology Report  ? ? ? ? ?Clinical history: Mantle Cell Lymphoma of lymph nodes of multiple  ?regions  ? ? ? ? ?DIAGNOSIS:  ? ?-Monoclonal B-cell population identified  ?-See comment  ? ?COMMENT:  ? ?The findings are consistent with involvement by previously known mantle  ?cell  lymphoma.  ? ? ?RADIOGRAPHIC STUDIES: ?I have personally reviewed the radiological images as listed and agreed with the findings in the report. ?No results found. ? ?ASSESSMENT & PLAN:  ? ?76 year old male with history of

## 2022-03-08 ENCOUNTER — Inpatient Hospital Stay: Payer: Medicare Other | Attending: Hematology and Oncology

## 2022-03-08 DIAGNOSIS — D6959 Other secondary thrombocytopenia: Secondary | ICD-10-CM | POA: Insufficient documentation

## 2022-03-08 DIAGNOSIS — D630 Anemia in neoplastic disease: Secondary | ICD-10-CM | POA: Insufficient documentation

## 2022-03-08 DIAGNOSIS — E43 Unspecified severe protein-calorie malnutrition: Secondary | ICD-10-CM | POA: Insufficient documentation

## 2022-03-08 DIAGNOSIS — C8318 Mantle cell lymphoma, lymph nodes of multiple sites: Secondary | ICD-10-CM | POA: Insufficient documentation

## 2022-03-08 DIAGNOSIS — Z87891 Personal history of nicotine dependence: Secondary | ICD-10-CM | POA: Insufficient documentation

## 2022-03-09 ENCOUNTER — Other Ambulatory Visit: Payer: Self-pay

## 2022-03-09 ENCOUNTER — Inpatient Hospital Stay (HOSPITAL_BASED_OUTPATIENT_CLINIC_OR_DEPARTMENT_OTHER): Payer: Medicare Other | Admitting: Hematology

## 2022-03-09 DIAGNOSIS — D630 Anemia in neoplastic disease: Secondary | ICD-10-CM | POA: Diagnosis not present

## 2022-03-09 DIAGNOSIS — C8318 Mantle cell lymphoma, lymph nodes of multiple sites: Secondary | ICD-10-CM

## 2022-03-09 DIAGNOSIS — E43 Unspecified severe protein-calorie malnutrition: Secondary | ICD-10-CM | POA: Diagnosis not present

## 2022-03-09 DIAGNOSIS — D6959 Other secondary thrombocytopenia: Secondary | ICD-10-CM | POA: Diagnosis not present

## 2022-03-09 DIAGNOSIS — Z87891 Personal history of nicotine dependence: Secondary | ICD-10-CM | POA: Diagnosis not present

## 2022-03-10 ENCOUNTER — Other Ambulatory Visit: Payer: Self-pay

## 2022-03-10 ENCOUNTER — Encounter: Payer: Self-pay | Admitting: Hematology

## 2022-03-10 ENCOUNTER — Inpatient Hospital Stay: Payer: Medicare Other

## 2022-03-10 DIAGNOSIS — C8318 Mantle cell lymphoma, lymph nodes of multiple sites: Secondary | ICD-10-CM | POA: Diagnosis not present

## 2022-03-10 LAB — CBC WITH DIFFERENTIAL (CANCER CENTER ONLY)
Abs Immature Granulocytes: 0.01 10*3/uL (ref 0.00–0.07)
Basophils Absolute: 0 10*3/uL (ref 0.0–0.1)
Basophils Relative: 1 %
Eosinophils Absolute: 0 10*3/uL (ref 0.0–0.5)
Eosinophils Relative: 2 %
HCT: 25.9 % — ABNORMAL LOW (ref 39.0–52.0)
Hemoglobin: 8.3 g/dL — ABNORMAL LOW (ref 13.0–17.0)
Immature Granulocytes: 1 %
Lymphocytes Relative: 31 %
Lymphs Abs: 0.7 10*3/uL (ref 0.7–4.0)
MCH: 29.7 pg (ref 26.0–34.0)
MCHC: 32 g/dL (ref 30.0–36.0)
MCV: 92.8 fL (ref 80.0–100.0)
Monocytes Absolute: 0.2 10*3/uL (ref 0.1–1.0)
Monocytes Relative: 8 %
Neutro Abs: 1.3 10*3/uL — ABNORMAL LOW (ref 1.7–7.7)
Neutrophils Relative %: 57 %
Platelet Count: 129 10*3/uL — ABNORMAL LOW (ref 150–400)
RBC: 2.79 MIL/uL — ABNORMAL LOW (ref 4.22–5.81)
RDW: 15.5 % (ref 11.5–15.5)
WBC Count: 2.2 10*3/uL — ABNORMAL LOW (ref 4.0–10.5)
nRBC: 0 % (ref 0.0–0.2)

## 2022-03-10 LAB — CMP (CANCER CENTER ONLY)
ALT: 9 U/L (ref 0–44)
AST: 15 U/L (ref 15–41)
Albumin: 3.4 g/dL — ABNORMAL LOW (ref 3.5–5.0)
Alkaline Phosphatase: 48 U/L (ref 38–126)
Anion gap: 5 (ref 5–15)
BUN: 11 mg/dL (ref 8–23)
CO2: 29 mmol/L (ref 22–32)
Calcium: 9.3 mg/dL (ref 8.9–10.3)
Chloride: 107 mmol/L (ref 98–111)
Creatinine: 0.92 mg/dL (ref 0.61–1.24)
GFR, Estimated: >60 mL/min (ref >60)
Glucose, Bld: 85 mg/dL (ref 70–99)
Potassium: 3.3 mmol/L — ABNORMAL LOW (ref 3.5–5.1)
Sodium: 141 mmol/L (ref 135–145)
Total Bilirubin: 0.5 mg/dL (ref 0.3–1.2)
Total Protein: 7 g/dL (ref 6.5–8.1)

## 2022-03-10 LAB — SAMPLE TO BLOOD BANK

## 2022-03-10 LAB — URIC ACID: Uric Acid, Serum: 8.6 mg/dL (ref 3.7–8.6)

## 2022-03-13 ENCOUNTER — Encounter (HOSPITAL_COMMUNITY)
Admission: RE | Admit: 2022-03-13 | Discharge: 2022-03-13 | Disposition: A | Discharge status: Home / Self Care | Payer: Medicare Other | Source: Ambulatory Visit | Attending: Hematology | Admitting: Hematology

## 2022-03-13 DIAGNOSIS — C8318 Mantle cell lymphoma, lymph nodes of multiple sites: Secondary | ICD-10-CM | POA: Diagnosis present

## 2022-03-13 LAB — GLUCOSE, CAPILLARY: Glucose-Capillary: 86 mg/dL (ref 70–99)

## 2022-03-13 MED ORDER — FLUDEOXYGLUCOSE F - 18 (FDG) INJECTION
9.8600 | Freq: Once | INTRAVENOUS | Status: AC
Start: 2022-03-13 — End: 2022-03-13
  Administered 2022-03-13: 9.86 via INTRAVENOUS

## 2022-03-15 ENCOUNTER — Other Ambulatory Visit: Payer: No Typology Code available for payment source

## 2022-03-16 ENCOUNTER — Encounter: Payer: Self-pay | Admitting: Hematology

## 2022-03-16 NOTE — Progress Notes (Signed)
HEMATOLOGY/ONCOLOGY PHONE VISIT NOTE  Date of Service: 03/09/2022    Patient Care Team: [facility] as PCP - General (General Practice)  CHIEF COMPLAINTS/PURPOSE OF CONSULTATION:  Follow-up for toxicity check after third cycle of Bendamustine Rituxan chemotherapy  HISTORY OF PRESENTING ILLNESS:  Please see previous note for details of initial presentation  INTERVAL HISTORY  .I connected with Jerry Tran on .03/09/2022 at  3:30 PM EDT by telephone visit and verified that I am speaking with the correct person using two identifiers.   I discussed the limitations, risks, security and privacy concerns of performing an evaluation and management service by telemedicine and the availability of in-person appointments. I also discussed with the patient that there may be a patient responsible charge related to this service. The patient expressed understanding and agreed to proceed.   Other persons participating in the visit and their role in the encounter: Patient's wife  Patient's location: Home Provider's location: [facility]  Chief Complaint: Toxicity check and review of labs after cycle 3 of Bendamustine Rituxan chemotherapy  Discussed lab results with the patient in detail.  Patient's PET CT scan is still pending and has been scheduled for 03/13/2022. Patient notes no acute new symptoms no fevers no other new concerns.   MEDICAL HISTORY:  Past Medical History:  Diagnosis Date   Arthritis    Chronic kidney disease, stage 3b ([facility]) 12/09/2021   Dyslipidemia    GERD (gastroesophageal reflux disease)    Hypertension    PAF (paroxysmal atrial fibrillation) ([facility])    Prostate cancer ([facility]) 03/2021   s/p radiation therapy   PTSD (post-traumatic stress disorder)     SURGICAL HISTORY: Past Surgical History:  Procedure Laterality Date   LIPOMA EXCISION Right 12/09/2021   Procedure: RIGHT SUPERCLAVICULAR LYMPH NODE EXCISION;  Surgeon: Ileana Roup, MD;  Location[facility];  Service: General;  Laterality: Right;    SOCIAL HISTORY: Social History   Socioeconomic History   Marital status: Married    Spouse name: Not on file   Number of children: Not on file   Years of education: Not on file   Highest education level: Not on file  Occupational History   Occupation: retired  Tobacco Use   Smoking status: Former    Packs/day: 1.00    Years: 15.00    Pack years: 15.00    Types: Cigarettes   Smokeless tobacco: Never  Substance and Sexual Activity   Alcohol use: Not Currently    Comment: h/o heavy use   Drug use: Not Currently    Types: Marijuana, Cocaine    Comment: remote   Sexual activity: Not on file  Other Topics Concern   Not on file  Social History Narrative   Not on file   Social Determinants of Health   Financial Resource Strain: Not on file  Food Insecurity: Not on file  Transportation Needs: Not on file  Physical Activity: Not on file  Stress: Not on file  Social Connections: Not on file  Intimate Partner Violence: Not on file    FAMILY HISTORY: Family History  Problem Relation Age of Onset   Cancer Neg Hx     ALLERGIES:  is allergic to sildenafil.  MEDICATIONS:  Current Outpatient Medications  Medication Sig Dispense Refill   acyclovir (ZOVIRAX) 400 MG tablet Take 1 tablet (400 mg total) by mouth daily. 30 tablet 3   ammonium lactate (LAC-HYDRIN) 12 % lotion Apply 1 application topically 2 (two) times  daily as needed for dry skin.     atorvastatin (LIPITOR) 20 MG tablet Take 10 mg by mouth at bedtime.     Cholecalciferol (VITAMIN D3 PO) Take 1 tablet by mouth every morning.     dexamethasone (DECADRON) 4 MG tablet Take 2 tablets (8 mg total) by mouth daily. Start the day after bendamustine chemotherapy for 2 days. Take with food. 30 tablet 1   diclofenac Sodium (VOLTAREN) 1 % GEL Apply 2 g topically 4 (four) times daily as needed (pain).     feeding supplement (ENSURE ENLIVE / ENSURE PLUS) LIQD  Take 237 mLs by mouth 2 (two) times daily between meals. 237 mL 12   flecainide (TAMBOCOR) 100 MG tablet Take 100 mg by mouth every 12 (twelve) hours.     fluticasone (FLONASE) 50 MCG/ACT nasal spray Place 1 spray into both nostrils daily as needed for allergies or rhinitis.     hydroxypropyl methylcellulose / hypromellose (ISOPTO TEARS / GONIOVISC) 2.5 % ophthalmic solution Place 2 drops into both eyes 2 (two) times daily as needed for dry eyes.     LORazepam (ATIVAN) 0.5 MG tablet Take 1 tablet (0.5 mg total) by mouth every 6 (six) hours as needed (Nausea or vomiting). 30 tablet 0   methocarbamol (ROBAXIN) 500 MG tablet Take 500 mg by mouth every 8 (eight) hours as needed for muscle spasms.     metoprolol tartrate (LOPRESSOR) 25 MG tablet Take 12.5 mg by mouth 2 (two) times daily. Take with flecainide     mirtazapine (REMERON) 30 MG tablet Take 30 mg by mouth at bedtime.     omeprazole (PRILOSEC) 20 MG capsule Take 20 mg by mouth daily before breakfast.     ondansetron (ZOFRAN) 8 MG tablet Take 1 tablet (8 mg total) by mouth 2 (two) times daily as needed for refractory nausea / vomiting. Start on day 2 after bendamustine chemo. 30 tablet 1   prazosin (MINIPRESS) 2 MG capsule Take 6 mg by mouth at bedtime.     predniSONE (DELTASONE) 20 MG tablet Take 1 tablet (20 mg total) by mouth daily with breakfast. 5 tablet 0   prochlorperazine (COMPAZINE) 10 MG tablet Take 1 tablet (10 mg total) by mouth every 6 (six) hours as needed (Nausea or vomiting). 30 tablet 1   terbinafine (LAMISIL) 1 % cream Apply 1 application topically 2 (two) times daily as needed (athlete's foot).     torsemide (DEMADEX) 10 MG tablet Take 2 tablets (20 mg total) by mouth daily. 15 tablet 0   triamcinolone cream (KENALOG) 0.1 % Apply 1 application topically 2 (two) times daily as needed (rash).     No current facility-administered medications for this visit.    REVIEW OF SYSTEMS:    10 Point review of Systems was done is  negative except as noted above.  PHYSICAL EXAMINATION: Telemedicine visit  LABORATORY DATA:  I have reviewed the data as listed  .    Latest Ref Rng & Units 03/10/2022   12:05 PM 02/22/2022   10:03 AM 01/26/2022    8:14 AM  CBC  WBC 4.0 - 10.5 K/uL 2.2   2.3   2.4    Hemoglobin 13.0 - 17.0 g/dL 8.3   8.6   9.7    Hematocrit 39.0 - 52.0 % 25.9   27.2   30.0    Platelets 150 - 400 K/uL 129   116   222    ANC 1300     Latest Ref Rng &  Units 03/10/2022   12:05 PM 02/22/2022   10:03 AM 01/26/2022    8:14 AM  CMP  Glucose 70 - 99 mg/dL 85   108   118    BUN 8 - 23 mg/dL '11   21   26    '$ Creatinine 0.61 - 1.24 mg/dL 0.92   1.11   1.32    Sodium 135 - 145 mmol/L 141   142   142    Potassium 3.5 - 5.1 mmol/L 3.3   3.3   3.6    Chloride 98 - 111 mmol/L 107   108   106    CO2 22 - 32 mmol/L '29   31   30    '$ Calcium 8.9 - 10.3 mg/dL 9.3   9.4   9.5    Total Protein 6.5 - 8.1 g/dL 7.0   7.2   6.5    Total Bilirubin 0.3 - 1.2 mg/dL 0.5   0.4   0.3    Alkaline Phos 38 - 126 U/L 48   48   69    AST 15 - 41 U/L '15   17   17    '$ ALT 0 - 44 U/L '9   10   10     '$ Uric acid 4.9 Surgical Pathology  CASE: [id number]  PATIENT: Jerry Tran  Flow Pathology Report      Clinical history: Mantle Cell Lymphoma of lymph nodes of multiple  regions      DIAGNOSIS:   -Monoclonal B-cell population identified  -See comment   COMMENT:   The findings are consistent with involvement by previously known mantle  cell lymphoma.    RADIOGRAPHIC STUDIES: I have personally reviewed the radiological images as listed and agreed with the findings in the report. NM PET Image Restag (PS) Skull Base To Thigh  Result Date: 03/15/2022 CLINICAL DATA:  Subsequent treatment strategy for mantle cell lymphoma. EXAM: NUCLEAR MEDICINE PET SKULL BASE TO THIGH TECHNIQUE: 9.9 mCi F-18 FDG was injected intravenously. Full-ring PET imaging was performed from the skull base to thigh after the radiotracer. CT data was  obtained and used for attenuation correction and anatomic localization. Fasting blood glucose: 86 mg/dl COMPARISON:  PET-CT dated 12/28/2021 FINDINGS: Mediastinal blood pool activity: SUV max 1.8 Liver activity: SUV max 2.7 NECK: No hypermetabolic cervical lymphadenopathy. Incidental CT findings: none CHEST: No hypermetabolic thoracic lymphadenopathy. No suspicious pulmonary nodules. Incidental CT findings: Mild atherosclerotic calcifications of the aortic arch. ABDOMEN/PELVIS: Small residual bilateral pelvic nodes, including: --13 mm short axis left external iliac node (series 4/image 158), max SUV 2.2 --11 mm short axis left pelvic sidewall node (series 4/image 165), max SUV 2.3 --11 mm short axis right pelvic sidewall node (series 4/image 168), max SUV 2.0 Spleen is within the upper limits of normal for size. Reference splenic max SUV 2.2. No abnormal hypermetabolism in the liver, pancreas, or adrenal glands. Incidental CT findings: Fiducial markers in the prostate. Mildly thick-walled bladder, although underdistended. SKELETON: No focal hypermetabolic activity to suggest skeletal metastasis. Incidental CT findings: Degenerative changes of the visualized thoracolumbar spine. IMPRESSION: Near complete resolution, with mild residual bilateral pelvic lymphadenopathy, as above. Deauville criteria 3. Electronically Signed   By: Julian Hy M.D.   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 PET CT scan 12/29/2021  1. Enlarged hypermetabolic  cervical, bilateral axillary, retroperitoneal, pelvic, and inguinal lymph nodes. Deauville 4. 2. Subcentimeter mediastinal lymph nodes with uptake similar to mediastinal blood pool. 3. Focal hypermetabolic uptake seen at the head of the right clavicle, concerning for osseous involvement. 4. Splenomegaly. 5. Increased tonsillar soft tissue, right greater than left, concerning for lymphomatous involvement.  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  3) moderate to severe protein calorie malnutrition-much improved patient is gaining weight and has been eating much better. . Wt Readings from Last 3 Encounters:  02/23/22 206 lb 4 oz (93.6 kg)  02/22/22 200 lb 6 oz (90.9 kg)  02/22/22 200 lb 9.6 oz (91 kg)     4) acute renal injury-likely due poor p.o. intake.  Due to spontaneous tumor lysis.  Also could be due to his hematuria and some element of urinary obstruction.  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  Uric acid 4.9 currently.  PLAN -Patient's labs from 03/10/2022 were discussed in detail with the patient CBC with anemia hemoglobin of 8.3 with a WBC count of 2.2k and ANC of 1300 platelets of 129k No indication for transfusion support at this time Uric acid 8.6 CMP potassium of 3.3 other chemistries stable Patient will follow-up for PET CT scan as scheduled for 03/13/2022 - No orders of the defined types were placed in this encounter.   Follow-up PET CT scan as scheduled Follow-up for cycle 4 Bendamustine Rituxan with labs and MD visit as scheduled   The total time spent in the appointment was 20 minutes*.  All of the patient's questions were answered with apparent satisfaction. The patient knows to call  the clinic with any problems, questions or concerns.   Sullivan Lone MD MS AAHIVMS [facility] Hematology/Oncology Physician [facility]  .*Total Encounter Time as defined by the Centers for Medicare and Medicaid Services includes, in addition to the face-to-face time of a patient visit (documented in the note above) non-face-to-face time: obtaining and reviewing outside history, ordering and reviewing medications, tests or procedures, care coordination (communications with other health care professionals or caregivers) and documentation in the medical record.

## 2022-03-20 ENCOUNTER — Other Ambulatory Visit: Payer: Self-pay

## 2022-03-20 DIAGNOSIS — C8318 Mantle cell lymphoma, lymph nodes of multiple sites: Secondary | ICD-10-CM

## 2022-03-21 ENCOUNTER — Inpatient Hospital Stay (HOSPITAL_BASED_OUTPATIENT_CLINIC_OR_DEPARTMENT_OTHER): Payer: Medicare Other | Admitting: Hematology

## 2022-03-21 ENCOUNTER — Inpatient Hospital Stay: Payer: Medicare Other

## 2022-03-21 VITALS — BP 121/67 | HR 66 | Temp 97.0°F | Resp 18 | Wt 198.3 lb

## 2022-03-21 DIAGNOSIS — D630 Anemia in neoplastic disease: Secondary | ICD-10-CM | POA: Diagnosis not present

## 2022-03-21 DIAGNOSIS — D6959 Other secondary thrombocytopenia: Secondary | ICD-10-CM

## 2022-03-21 DIAGNOSIS — Z87891 Personal history of nicotine dependence: Secondary | ICD-10-CM

## 2022-03-21 DIAGNOSIS — C8318 Mantle cell lymphoma, lymph nodes of multiple sites: Secondary | ICD-10-CM

## 2022-03-21 DIAGNOSIS — E883 Tumor lysis syndrome: Secondary | ICD-10-CM

## 2022-03-21 DIAGNOSIS — E43 Unspecified severe protein-calorie malnutrition: Secondary | ICD-10-CM | POA: Diagnosis not present

## 2022-03-21 DIAGNOSIS — T451X5A Adverse effect of antineoplastic and immunosuppressive drugs, initial encounter: Secondary | ICD-10-CM

## 2022-03-21 DIAGNOSIS — Z5111 Encounter for antineoplastic chemotherapy: Secondary | ICD-10-CM

## 2022-03-21 DIAGNOSIS — D701 Agranulocytosis secondary to cancer chemotherapy: Secondary | ICD-10-CM

## 2022-03-21 LAB — CBC WITH DIFFERENTIAL (CANCER CENTER ONLY)
Abs Immature Granulocytes: 0 10*3/uL (ref 0.00–0.07)
Basophils Absolute: 0 10*3/uL (ref 0.0–0.1)
Basophils Relative: 1 %
Eosinophils Absolute: 0.1 10*3/uL (ref 0.0–0.5)
Eosinophils Relative: 4 %
HCT: 28.6 % — ABNORMAL LOW (ref 39.0–52.0)
Hemoglobin: 9.3 g/dL — ABNORMAL LOW (ref 13.0–17.0)
Immature Granulocytes: 0 %
Lymphocytes Relative: 46 %
Lymphs Abs: 0.9 10*3/uL (ref 0.7–4.0)
MCH: 30 pg (ref 26.0–34.0)
MCHC: 32.5 g/dL (ref 30.0–36.0)
MCV: 92.3 fL (ref 80.0–100.0)
Monocytes Absolute: 0.2 10*3/uL (ref 0.1–1.0)
Monocytes Relative: 11 %
Neutro Abs: 0.7 10*3/uL — ABNORMAL LOW (ref 1.7–7.7)
Neutrophils Relative %: 38 %
Platelet Count: 165 10*3/uL (ref 150–400)
RBC: 3.1 MIL/uL — ABNORMAL LOW (ref 4.22–5.81)
RDW: 14.9 % (ref 11.5–15.5)
WBC Count: 1.9 10*3/uL — ABNORMAL LOW (ref 4.0–10.5)
nRBC: 0 % (ref 0.0–0.2)

## 2022-03-21 LAB — CMP (CANCER CENTER ONLY)
ALT: 8 U/L (ref 0–44)
AST: 16 U/L (ref 15–41)
Albumin: 3.7 g/dL (ref 3.5–5.0)
Alkaline Phosphatase: 56 U/L (ref 38–126)
Anion gap: 5 (ref 5–15)
BUN: 19 mg/dL (ref 8–23)
CO2: 29 mmol/L (ref 22–32)
Calcium: 9.7 mg/dL (ref 8.9–10.3)
Chloride: 107 mmol/L (ref 98–111)
Creatinine: 1.19 mg/dL (ref 0.61–1.24)
GFR, Estimated: >60 mL/min (ref >60)
Glucose, Bld: 110 mg/dL — ABNORMAL HIGH (ref 70–99)
Potassium: 3.4 mmol/L — ABNORMAL LOW (ref 3.5–5.1)
Sodium: 141 mmol/L (ref 135–145)
Total Bilirubin: 0.3 mg/dL (ref 0.3–1.2)
Total Protein: 7.7 g/dL (ref 6.5–8.1)

## 2022-03-21 LAB — URIC ACID: Uric Acid, Serum: 9.2 mg/dL — ABNORMAL HIGH (ref 3.7–8.6)

## 2022-03-21 LAB — SAMPLE TO BLOOD BANK

## 2022-03-22 ENCOUNTER — Ambulatory Visit: Payer: No Typology Code available for payment source

## 2022-03-24 ENCOUNTER — Telehealth: Payer: Self-pay | Admitting: Hematology

## 2022-03-24 ENCOUNTER — Inpatient Hospital Stay: Payer: Medicare Other

## 2022-03-24 ENCOUNTER — Other Ambulatory Visit: Payer: Self-pay

## 2022-03-24 DIAGNOSIS — C8318 Mantle cell lymphoma, lymph nodes of multiple sites: Secondary | ICD-10-CM

## 2022-03-24 NOTE — Telephone Encounter (Signed)
Called patient regarding upcoming appointments, left multiple voicemail's.

## 2022-03-28 ENCOUNTER — Inpatient Hospital Stay: Payer: Medicare Other

## 2022-03-28 ENCOUNTER — Encounter: Payer: Self-pay | Admitting: Hematology

## 2022-03-28 VITALS — BP 149/83 | HR 66 | Temp 97.9°F | Resp 18 | Ht 76.0 in | Wt 195.0 lb

## 2022-03-28 DIAGNOSIS — C8318 Mantle cell lymphoma, lymph nodes of multiple sites: Secondary | ICD-10-CM

## 2022-03-28 DIAGNOSIS — E883 Tumor lysis syndrome: Secondary | ICD-10-CM

## 2022-03-28 DIAGNOSIS — Z7189 Other specified counseling: Secondary | ICD-10-CM

## 2022-03-28 LAB — CBC WITH DIFFERENTIAL (CANCER CENTER ONLY)
Abs Immature Granulocytes: 0 10*3/uL (ref 0.00–0.07)
Basophils Absolute: 0 10*3/uL (ref 0.0–0.1)
Basophils Relative: 1 %
Eosinophils Absolute: 0 10*3/uL (ref 0.0–0.5)
Eosinophils Relative: 1 %
HCT: 31.2 % — ABNORMAL LOW (ref 39.0–52.0)
Hemoglobin: 9.7 g/dL — ABNORMAL LOW (ref 13.0–17.0)
Immature Granulocytes: 0 %
Lymphocytes Relative: 37 %
Lymphs Abs: 0.8 10*3/uL (ref 0.7–4.0)
MCH: 28.7 pg (ref 26.0–34.0)
MCHC: 31.1 g/dL (ref 30.0–36.0)
MCV: 92.3 fL (ref 80.0–100.0)
Monocytes Absolute: 0.2 10*3/uL (ref 0.1–1.0)
Monocytes Relative: 8 %
Neutro Abs: 1.1 10*3/uL — ABNORMAL LOW (ref 1.7–7.7)
Neutrophils Relative %: 53 %
Platelet Count: 159 10*3/uL (ref 150–400)
RBC: 3.38 MIL/uL — ABNORMAL LOW (ref 4.22–5.81)
RDW: 14.3 % (ref 11.5–15.5)
WBC Count: 2.1 10*3/uL — ABNORMAL LOW (ref 4.0–10.5)
nRBC: 0 % (ref 0.0–0.2)

## 2022-03-28 LAB — CMP (CANCER CENTER ONLY)
ALT: 7 U/L (ref 0–44)
AST: 16 U/L (ref 15–41)
Albumin: 3.8 g/dL (ref 3.5–5.0)
Alkaline Phosphatase: 54 U/L (ref 38–126)
Anion gap: 10 (ref 5–15)
BUN: 23 mg/dL (ref 8–23)
CO2: 27 mmol/L (ref 22–32)
Calcium: 10 mg/dL (ref 8.9–10.3)
Chloride: 102 mmol/L (ref 98–111)
Creatinine: 1.17 mg/dL (ref 0.61–1.24)
GFR, Estimated: >60 mL/min (ref >60)
Glucose, Bld: 99 mg/dL (ref 70–99)
Potassium: 3.8 mmol/L (ref 3.5–5.1)
Sodium: 139 mmol/L (ref 135–145)
Total Bilirubin: 0.3 mg/dL (ref 0.3–1.2)
Total Protein: 8 g/dL (ref 6.5–8.1)

## 2022-03-28 MED ORDER — SODIUM CHLORIDE 0.9 % IV SOLN: Freq: Once | INTRAVENOUS | Status: AC

## 2022-03-28 MED ORDER — FAMOTIDINE IN NACL 20-0.9 MG/50ML-% IV SOLN
20.0000 mg | Freq: Once | INTRAVENOUS | Status: AC
  Administered 2022-03-28: 20 mg via INTRAVENOUS
  Filled 2022-03-28: qty 50

## 2022-03-28 MED ORDER — SODIUM CHLORIDE 0.9 % IV SOLN
375.0000 mg/m2 | Freq: Once | INTRAVENOUS | Status: AC
  Administered 2022-03-28: 800 mg via INTRAVENOUS
  Filled 2022-03-28: qty 50

## 2022-03-28 MED ORDER — SODIUM CHLORIDE 0.9 % IV SOLN
10.0000 mg | Freq: Once | INTRAVENOUS | Status: AC
  Administered 2022-03-28: 10 mg via INTRAVENOUS
  Filled 2022-03-28: qty 1

## 2022-03-28 MED ORDER — DIPHENHYDRAMINE HCL 25 MG PO CAPS
50.0000 mg | ORAL_CAPSULE | Freq: Once | ORAL | Status: AC
  Administered 2022-03-28: 50 mg via ORAL
  Filled 2022-03-28: qty 2

## 2022-03-28 MED ORDER — SODIUM CHLORIDE 0.9 % IV SOLN
70.0000 mg/m2 | Freq: Once | INTRAVENOUS | Status: AC
  Administered 2022-03-28: 150 mg via INTRAVENOUS
  Filled 2022-03-28: qty 6

## 2022-03-28 MED ORDER — PALONOSETRON HCL INJECTION 0.25 MG/5ML
0.2500 mg | Freq: Once | INTRAVENOUS | Status: AC
  Administered 2022-03-28: 0.25 mg via INTRAVENOUS
  Filled 2022-03-28: qty 5

## 2022-03-28 MED ORDER — ACETAMINOPHEN 325 MG PO TABS
650.0000 mg | ORAL_TABLET | Freq: Once | ORAL | Status: AC
  Administered 2022-03-28: 650 mg via ORAL
  Filled 2022-03-28: qty 2

## 2022-03-28 NOTE — Progress Notes (Signed)
Per Dr. Irene Limbo, [facility] to proceed with treatment with [place] 1.1.

## 2022-03-28 NOTE — Progress Notes (Signed)
HEMATOLOGY/ONCOLOGY PHONE VISIT NOTE  Date of Service: 03/21/2022   Patient Care Team: [facility] [place] as PCP - General (General Practice)  CHIEF COMPLAINTS/PURPOSE OF CONSULTATION:  Follow-up prior to cycle 4 of Bendamustine Rituxan chemotherapy and for discussion of PET scan  HISTORY OF PRESENTING ILLNESS:  Please see previous note for details of initial presentation  INTERVAL HISTORY  Jerry Tran is here for continued evaluation and management of his mantle cell lymphoma and for follow-up prior to his fourth cycle of Bendamustine Rituxan chemotherapy. He recently had a PET CT scan after 3 cycles of Bendamustine Rituxan which shows excellent response to treatment. Labs today showed significant neutropenia.  Patient has had no fevers or focal signs of infection. We discussed that given his neutropenia despite growth factor support and the fact that his lymphoma is well controlled at this time it would be safer to delay his neck cycle of chemotherapy by about a week to allow his neutropenia to resolve to reduce the risk of neutropenic fevers. Labs and PET CT scan discussed in detail with the patient  MEDICAL HISTORY:  Past Medical History:  Diagnosis Date   Arthritis    Chronic kidney disease, stage 3b ([place]) 12/09/2021   Dyslipidemia    GERD (gastroesophageal reflux disease)    Hypertension    PAF (paroxysmal atrial fibrillation) (HCC)    Prostate cancer ([place]) 03/2021   s/p radiation therapy   PTSD (post-traumatic stress disorder)     SURGICAL HISTORY: Past Surgical History:  Procedure Laterality Date   LIPOMA EXCISION Right 12/09/2021   Procedure: RIGHT SUPERCLAVICULAR LYMPH NODE EXCISION;  Surgeon: Ileana Roup, MD;  Location: [facility] OR;  Service: General;  Laterality: Right;    SOCIAL HISTORY: Social History   Socioeconomic History   Marital status: Married    Spouse name: Not on file   Number of children: Not on file   Years of education: Not on  file   Highest education level: Not on file  Occupational History   Occupation: retired  Tobacco Use   Smoking status: Former    Packs/day: 1.00    Years: 15.00    Pack years: 15.00    Types: Cigarettes   Smokeless tobacco: Never  Substance and Sexual Activity   Alcohol use: Not Currently    Comment: h/o heavy use   Drug use: Not Currently    Types: Marijuana, Cocaine    Comment: remote   Sexual activity: Not on file  Other Topics Concern   Not on file  Social History Narrative   Not on file   Social Determinants of Health   Financial Resource Strain: Not on file  Food Insecurity: Not on file  Transportation Needs: Not on file  Physical Activity: Not on file  Stress: Not on file  Social Connections: Not on file  Intimate Partner Violence: Not on file    FAMILY HISTORY: Family History  Problem Relation Age of Onset   Cancer Neg Hx     ALLERGIES:  is allergic to sildenafil.  MEDICATIONS:  Current Outpatient Medications  Medication Sig Dispense Refill   acyclovir (ZOVIRAX) 400 MG tablet Take 1 tablet (400 mg total) by mouth daily. 30 tablet 3   ammonium lactate (LAC-HYDRIN) 12 % lotion Apply 1 application topically 2 (two) times daily as needed for dry skin.     atorvastatin (LIPITOR) 20 MG tablet Take 10 mg by mouth at bedtime.     Cholecalciferol (VITAMIN D3 PO) Take 1 tablet  by mouth every morning.     dexamethasone (DECADRON) 4 MG tablet Take 2 tablets (8 mg total) by mouth daily. Start the day after bendamustine chemotherapy for 2 days. Take with food. 30 tablet 1   diclofenac Sodium (VOLTAREN) 1 % GEL Apply 2 g topically 4 (four) times daily as needed (pain).     feeding supplement (ENSURE ENLIVE / ENSURE PLUS) LIQD Take 237 mLs by mouth 2 (two) times daily between meals. 237 mL 12   flecainide (TAMBOCOR) 100 MG tablet Take 100 mg by mouth every 12 (twelve) hours.     fluticasone (FLONASE) 50 MCG/ACT nasal spray Place 1 spray into both nostrils daily as needed  for allergies or rhinitis.     hydroxypropyl methylcellulose / hypromellose (ISOPTO TEARS / GONIOVISC) 2.5 % ophthalmic solution Place 2 drops into both eyes 2 (two) times daily as needed for dry eyes.     LORazepam (ATIVAN) 0.5 MG tablet Take 1 tablet (0.5 mg total) by mouth every 6 (six) hours as needed (Nausea or vomiting). 30 tablet 0   methocarbamol (ROBAXIN) 500 MG tablet Take 500 mg by mouth every 8 (eight) hours as needed for muscle spasms.     metoprolol tartrate (LOPRESSOR) 25 MG tablet Take 12.5 mg by mouth 2 (two) times daily. Take with flecainide     mirtazapine (REMERON) 30 MG tablet Take 30 mg by mouth at bedtime.     omeprazole (PRILOSEC) 20 MG capsule Take 20 mg by mouth daily before breakfast.     ondansetron (ZOFRAN) 8 MG tablet Take 1 tablet (8 mg total) by mouth 2 (two) times daily as needed for refractory nausea / vomiting. Start on day 2 after bendamustine chemo. 30 tablet 1   prazosin (MINIPRESS) 2 MG capsule Take 6 mg by mouth at bedtime.     predniSONE (DELTASONE) 20 MG tablet Take 1 tablet (20 mg total) by mouth daily with breakfast. 5 tablet 0   prochlorperazine (COMPAZINE) 10 MG tablet Take 1 tablet (10 mg total) by mouth every 6 (six) hours as needed (Nausea or vomiting). 30 tablet 1   terbinafine (LAMISIL) 1 % cream Apply 1 application topically 2 (two) times daily as needed (athlete's foot).     torsemide (DEMADEX) 10 MG tablet Take 2 tablets (20 mg total) by mouth daily. 15 tablet 0   triamcinolone cream (KENALOG) 0.1 % Apply 1 application topically 2 (two) times daily as needed (rash).     No current facility-administered medications for this visit.    REVIEW OF SYSTEMS:    10 Point review of Systems was done is negative except as noted above.  PHYSICAL EXAMINATION: Telemedicine visit  LABORATORY DATA:  I have reviewed the data as listed  .    Latest Ref Rng & Units 03/21/2022   10:28 AM 03/10/2022   12:05 PM 02/22/2022   10:03 AM  CBC  WBC 4.0 - 10.5  K/uL 1.9   2.2   2.3    Hemoglobin 13.0 - 17.0 g/dL 9.3   8.3   8.6    Hematocrit 39.0 - 52.0 % 28.6   25.9   27.2    Platelets 150 - 400 K/uL 165   129   116    ANC 700     Latest Ref Rng & Units 03/21/2022   10:28 AM 03/10/2022   12:05 PM 02/22/2022   10:03 AM  CMP  Glucose 70 - 99 mg/dL 110   85   108  BUN 8 - 23 mg/dL '19   11   21    '$ Creatinine 0.61 - 1.24 mg/dL 1.19   0.92   1.11    Sodium 135 - 145 mmol/L 141   141   142    Potassium 3.5 - 5.1 mmol/L 3.4   3.3   3.3    Chloride 98 - 111 mmol/L 107   107   108    CO2 22 - 32 mmol/L '29   29   31    '$ Calcium 8.9 - 10.3 mg/dL 9.7   9.3   9.4    Total Protein 6.5 - 8.1 g/dL 7.7   7.0   7.2    Total Bilirubin 0.3 - 1.2 mg/dL 0.3   0.5   0.4    Alkaline Phos 38 - 126 U/L 56   48   48    AST 15 - 41 U/L '16   15   17    '$ ALT 0 - 44 U/L '8   9   10     '$ . Uric acid 4.9 Surgical Pathology  CASE: [id number]  PATIENT: Jamarkis Wien  Flow Pathology Report  Clinical history: Mantle Cell Lymphoma of lymph nodes of multiple  regions  DIAGNOSIS:  -Monoclonal B-cell population identified  -See comment   COMMENT:   The findings are consistent with involvement by previously known mantle  cell lymphoma.    RADIOGRAPHIC STUDIES: I have personally reviewed the radiological images as listed and agreed with the findings in the report.  NM PET Image Restag (PS) Skull Base To Thigh  Result Date: 03/15/2022 CLINICAL DATA:  Subsequent treatment strategy for mantle cell lymphoma. EXAM: NUCLEAR MEDICINE PET SKULL BASE TO THIGH TECHNIQUE: 9.9 mCi F-18 FDG was injected intravenously. Full-ring PET imaging was performed from the skull base to thigh after the radiotracer. CT data was obtained and used for attenuation correction and anatomic localization. Fasting blood glucose: 86 mg/dl COMPARISON:  PET-CT dated 12/28/2021 FINDINGS: Mediastinal blood pool activity: SUV max 1.8 Liver activity: SUV max 2.7 NECK: No hypermetabolic cervical  lymphadenopathy. Incidental CT findings: none CHEST: No hypermetabolic thoracic lymphadenopathy. No suspicious pulmonary nodules. Incidental CT findings: Mild atherosclerotic calcifications of the aortic arch. ABDOMEN/PELVIS: Small residual bilateral pelvic nodes, including: --13 mm short axis left external iliac node (series 4/image 158), max SUV 2.2 --11 mm short axis left pelvic sidewall node (series 4/image 165), max SUV 2.3 --11 mm short axis right pelvic sidewall node (series 4/image 168), max SUV 2.0 Spleen is within the upper limits of normal for size. Reference splenic max SUV 2.2. No abnormal hypermetabolism in the liver, pancreas, or adrenal glands. Incidental CT findings: Fiducial markers in the prostate. Mildly thick-walled bladder, although underdistended. SKELETON: No focal hypermetabolic activity to suggest skeletal metastasis. Incidental CT findings: Degenerative changes of the visualized thoracolumbar spine. IMPRESSION: Near complete resolution, with mild residual bilateral pelvic lymphadenopathy, as above. Deauville criteria 3. Electronically Signed   By: Julian Hy M.D.   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 PET CT scan 12/29/2021  1. Enlarged hypermetabolic cervical, bilateral axillary, retroperitoneal,  pelvic, and inguinal lymph nodes. Deauville 4. 2. Subcentimeter mediastinal lymph nodes with uptake similar to mediastinal blood pool. 3. Focal hypermetabolic uptake seen at the head of the right clavicle,  concerning for osseous involvement. 4. Splenomegaly. 5. Increased tonsillar soft tissue, right greater than left, concerning for lymphomatous involvement.  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  3) moderate to severe protein calorie malnutrition-much improved patient is gaining weight and has been eating much better. . Wt Readings from Last 3 Encounters:  03/21/22 198 lb 4.8 oz (89.9 kg)  02/23/22 206 lb 4 oz (93.6 kg)  02/22/22 200 lb 6 oz (90.9 kg)     4) acute renal injury-likely due poor p.o. intake.  Due to spontaneous tumor lysis.  Also could be due to his hematuria and some element of urinary obstruction.  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 -PET CT scan discussed in detail.  Patient had excellent response after 3 cycles of Bendamustine Rituxan. -We will reschedule the fourth cycle of Bendamustine Rituxan about 1 week to allow for resolution of neutropenia. -We will continue remaining cycles of Bendamustine Rituxan with Bendamustine at 70 mg per metered square with G-CSF support. -We will plan to do 5-6 cycles of BR as tolerated followed by maintenance Rituxan.  No orders of the defined types were placed in this encounter.   Follow-up note: Plz move current cycle of BR -D1,D2 and D4 (scheduled for 5/23) out 7-10 days with portflush and labs (due to neutropenia). PLz adjust subsequent cycle of BR 4 weeks out from this one   The total time spent in the appointment was 30 minutes*.  All of the patient's questions were answered with apparent satisfaction. The patient knows to call the clinic with any problems, questions or concerns.   Sullivan Lone MD MS AAHIVMS [facility]  Physician [facility]  .*Total Encounter Time as defined by the Centers for Medicare and Medicaid Services includes, in addition to the face-to-face time of a patient visit (documented in the note above) non-face-to-face time: obtaining and reviewing outside history, ordering and reviewing medications, tests or procedures, care coordination (communications with other health care professionals or caregivers) and documentation in the medical record.

## 2022-03-28 NOTE — Patient Instructions (Addendum)
[facility]  Discharge Instructions: Thank you for choosing [facility] to provide your oncology and hematology care.   If you have a lab appointment with the [facility], please go directly to the [facility] and check in at the registration area.   Wear comfortable clothing and clothing appropriate for easy access to any Portacath or PICC line.   We strive to give you quality time with your provider. You may need to reschedule your appointment if you arrive late (15 or more minutes).  Arriving late affects you and other patients whose appointments are after yours.  Also, if you miss three or more appointments without notifying the office, you may be dismissed from the clinic at the provider's discretion.      For prescription refill requests, have your pharmacy contact our office and allow 72 hours for refills to be completed.    Today you received the following chemotherapy and/or immunotherapy agents bendeka, rituxan      To help prevent nausea and vomiting after your treatment, we encourage you to take your nausea medication as directed.  BELOW ARE SYMPTOMS THAT SHOULD BE REPORTED IMMEDIATELY: *FEVER GREATER THAN 100.4 F (38 C) OR HIGHER *CHILLS OR SWEATING *NAUSEA AND VOMITING THAT IS NOT CONTROLLED WITH YOUR NAUSEA MEDICATION *UNUSUAL SHORTNESS OF BREATH *UNUSUAL BRUISING OR BLEEDING *URINARY PROBLEMS (pain or burning when urinating, or frequent urination) *BOWEL PROBLEMS (unusual diarrhea, constipation, pain near the anus) TENDERNESS IN MOUTH AND THROAT WITH OR WITHOUT PRESENCE OF ULCERS (sore throat, sores in mouth, or a toothache) UNUSUAL RASH, SWELLING OR PAIN  UNUSUAL VAGINAL DISCHARGE OR ITCHING   Items with * indicate a potential emergency and should be followed up as soon as possible or go to the Emergency Department if any problems should occur.  Please show the CHEMOTHERAPY ALERT CARD or IMMUNOTHERAPY ALERT CARD at check-in  to the Emergency Department and triage nurse.  Should you have questions after your visit or need to cancel or reschedule your appointment, please contact [facility]  Dept: [phone]  and follow the prompts.  Office hours are 8:00 a.m. to 4:30 p.m. Monday - Friday. Please note that voicemails left after 4:00 p.m. may not be returned until the following business day.  We are closed weekends and major holidays. You have access to a nurse at all times for urgent questions. Please call the main number to the clinic Dept: [phone] and follow the prompts.   For any non-urgent questions, you may also contact your provider using MyChart. We now offer e-Visits for anyone 72 and older to request care online for non-urgent symptoms. For details visit [email or web address].   Also download the MyChart app! Go to the app store, search "MyChart", open the app, select [facility], and log in with your MyChart username and password.  Due to Covid, a mask is required upon entering the hospital/clinic. If you do not have a mask, one will be given to you upon arrival. For doctor visits, patients may have 1 support person aged 75 or older with them. For treatment visits, patients cannot have anyone with them due to current Covid guidelines and our immunocompromised population.

## 2022-03-29 ENCOUNTER — Inpatient Hospital Stay: Payer: Medicare Other

## 2022-03-29 VITALS — BP 123/82 | HR 66 | Temp 98.1°F | Resp 18

## 2022-03-29 DIAGNOSIS — C8318 Mantle cell lymphoma, lymph nodes of multiple sites: Secondary | ICD-10-CM

## 2022-03-29 DIAGNOSIS — Z7189 Other specified counseling: Secondary | ICD-10-CM

## 2022-03-29 MED ORDER — SODIUM CHLORIDE 0.9 % IV SOLN: Freq: Once | INTRAVENOUS | Status: AC

## 2022-03-29 MED ORDER — SODIUM CHLORIDE 0.9 % IV SOLN
10.0000 mg | Freq: Once | INTRAVENOUS | Status: AC
  Administered 2022-03-29: 10 mg via INTRAVENOUS
  Filled 2022-03-29: qty 10

## 2022-03-29 MED ORDER — SODIUM CHLORIDE 0.9 % IV SOLN
70.0000 mg/m2 | Freq: Once | INTRAVENOUS | Status: AC
  Administered 2022-03-29: 150 mg via INTRAVENOUS
  Filled 2022-03-29: qty 6

## 2022-03-29 NOTE — Patient Instructions (Signed)
Jerry Tran  Discharge Instructions: Thank you for choosing [facility] to provide your oncology and hematology care.   If you have a lab appointment with the [facility], please go directly to the [facility] and check in at the registration area.   Wear comfortable clothing and clothing appropriate for easy access to any Portacath or PICC line.   We strive to give you quality time with your provider. You may need to reschedule your appointment if you arrive late (15 or more minutes).  Arriving late affects you and other patients whose appointments are after yours.  Also, if you miss three or more appointments without notifying the office, you may be dismissed from the clinic at the provider's discretion.      For prescription refill requests, have your pharmacy contact our office and allow 72 hours for refills to be completed.    Today you received the following chemotherapy and/or immunotherapy agents: bendamustine      To help prevent nausea and vomiting after your treatment, we encourage you to take your nausea medication as directed.  BELOW ARE SYMPTOMS THAT SHOULD BE REPORTED IMMEDIATELY: *FEVER GREATER THAN 100.4 F (38 C) OR HIGHER *CHILLS OR SWEATING *NAUSEA AND VOMITING THAT IS NOT CONTROLLED WITH YOUR NAUSEA MEDICATION *UNUSUAL SHORTNESS OF BREATH *UNUSUAL BRUISING OR BLEEDING *URINARY PROBLEMS (pain or burning when urinating, or frequent urination) *BOWEL PROBLEMS (unusual diarrhea, constipation, pain near the anus) TENDERNESS IN MOUTH AND THROAT WITH OR WITHOUT PRESENCE OF ULCERS (sore throat, sores in mouth, or a toothache) UNUSUAL RASH, SWELLING OR PAIN  UNUSUAL VAGINAL DISCHARGE OR ITCHING   Items with * indicate a potential emergency and should be followed up as soon as possible or go to the Emergency Department if any problems should occur.  Please show the CHEMOTHERAPY ALERT CARD or IMMUNOTHERAPY ALERT CARD at check-in to  the Emergency Department and triage nurse.  Should you have questions after your visit or need to cancel or reschedule your appointment, please contact [facility]  Dept: [phone]  and follow the prompts.  Office hours are 8:00 a.m. to 4:30 p.m. Monday - Friday. Please note that voicemails left after 4:00 p.m. may not be returned until the following business day.  We are closed weekends and major holidays. You have access to a nurse at all times for urgent questions. Please call the main number to the clinic Dept: [phone] and follow the prompts.   For any non-urgent questions, you may also contact your provider using MyChart. We now offer e-Visits for anyone 72 and older to request care online for non-urgent symptoms. For details visit [email or web address].   Also download the MyChart app! Go to the app store, search "MyChart", open the app, select [facility], and log in with your MyChart username and password.  Due to Covid, a mask is required upon entering the hospital/clinic. If you do not have a mask, one will be given to you upon arrival. For doctor visits, patients may have 1 support person aged 66 or older with them. For treatment visits, patients cannot have anyone with them due to current Covid guidelines and our immunocompromised population.   Bendamustine Injection What is this medication? BENDAMUSTINE (BEN da MUS teen) is a chemotherapy drug. It is used to treat chronic lymphocytic leukemia and non-Hodgkin lymphoma. This medicine may be used for other purposes; ask your health care provider or pharmacist if you have questions. COMMON BRAND NAME(S): BELRAPZO,  Jerry Tran What should I tell my care team before I take this medication? They need to know if you have any of these conditions: infection (especially a virus infection such as chickenpox, cold sores, or herpes) kidney disease liver disease an unusual or allergic reaction  to bendamustine, mannitol, other medicines, foods, dyes, or preservatives pregnant or trying to get pregnant breast-feeding How should I use this medication? This medicine is for infusion into a vein. It is given by a health care professional in a hospital or clinic setting. Talk to your pediatrician regarding the use of this medicine in children. Special care may be needed. Overdosage: If you think you have taken too much of this medicine contact a poison control center or emergency room at once. NOTE: This medicine is only for you. Do not share this medicine with others. What if I miss a dose? It is important not to miss your dose. Call your doctor or health care professional if you are unable to keep an appointment. What may interact with this medication? Do not take this medicine with any of the following medications: clozapine This medicine may also interact with the following medications: atazanavir cimetidine ciprofloxacin enoxacin fluvoxamine medicines for seizures like carbamazepine and phenobarbital mexiletine rifampin tacrine thiabendazole zileuton This list may not describe all possible interactions. Give your health care provider a list of all the medicines, herbs, non-prescription drugs, or dietary supplements you use. Also tell them if you smoke, drink alcohol, or use illegal drugs. Some items may interact with your medicine. What should I watch for while using this medication? This drug may make you feel generally unwell. This is not uncommon, as chemotherapy can affect healthy cells as well as cancer cells. Report any side effects. Continue your course of treatment even though you feel ill unless your doctor tells you to stop. You may need blood work done while you are taking this medicine. Call your doctor or healthcare provider for advice if you get a fever, chills or sore throat, or other symptoms of a cold or flu. Do not treat yourself. This drug decreases your  body's ability to fight infections. Try to avoid being around people who are sick. This medicine may cause serious skin reactions. They can happen weeks to months after starting the medicine. Contact your healthcare provider right away if you notice fevers or flu-like symptoms with a rash. The rash may be red or purple and then turn into blisters or peeling of the skin. Or, you might notice a red rash with swelling of the face, lips or lymph nodes in your neck or under your arms. In some patients, this medicine may cause a serious brain infection that may cause death. If you have any problems seeing, thinking, speaking, walking, or standing, tell your health care provider right away. If you cannot reach your health care provider, urgently seek other source of medical care. This medicine may increase your risk to bruise or bleed. Call your doctor or healthcare provider if you notice any unusual bleeding. Talk to your doctor about your risk of cancer. You may be more at risk for certain types of cancers if you take this medicine. This medicine may increase your risk of skin cancer. Check your skin for changes to moles or for new growths while taking this medicine. Call your health care provider if you notice any of these skin changes. Do not become pregnant while taking this medicine or for at least 6 months after stopping it. Women  should inform their doctor if they wish to become pregnant or think they might be pregnant. Men should not father a child while taking this medicine and for at least 3 months after stopping it. There is a potential for serious side effects to an unborn child. Talk to your healthcare provider or pharmacist for more information. Do not breast-feed an infant while taking this medicine or for at least 1 week after stopping it. This medicine may make it more difficult to father a child. You should talk with your doctor or healthcare provider if you are concerned about your  fertility. What side effects may I notice from receiving this medication? Side effects that you should report to your doctor or health care professional as soon as possible: allergic reactions like skin rash, itching or hives, swelling of the face, lips, or tongue low blood counts - this medicine may decrease the number of white blood cells, red blood cells and platelets. You may be at increased risk for infections and bleeding. rash, fever, and swollen lymph nodes redness, blistering, peeling, or loosening of the skin, including inside the mouth signs of infection like fever or chills, cough, sore throat, pain or difficulty passing urine signs of decreased platelets or bleeding like bruising, pinpoint red spots on the skin, black, tarry stools, blood in the urine signs of decreased red blood cells like being unusually weak or tired, fainting spells, lightheadedness signs and symptoms of kidney injury like trouble passing urine or change in the amount of urine signs and symptoms of liver injury like dark yellow or brown urine; general ill feeling or flu-like symptoms; light-colored stools; loss of appetite; nausea; right upper belly pain; unusually weak or tired; yellowing of the eyes or skin Side effects that usually do not require medical attention (report to your doctor or health care professional if they continue or are bothersome): constipation decreased appetite diarrhea headache mouth sores nausea, vomiting tiredness This list may not describe all possible side effects. Call your doctor for medical advice about side effects. You may report side effects to FDA at 1-800-FDA-1088. Where should I keep my medication? This drug is given in a hospital or clinic and will not be stored at home. NOTE: This sheet is a summary. It may not cover all possible information. If you have questions about this medicine, talk to your doctor, pharmacist, or health care provider.  2023 [employer]/Gold  Standard (2020-04-13 00:00:00)

## 2022-03-31 ENCOUNTER — Other Ambulatory Visit: Payer: Self-pay

## 2022-03-31 ENCOUNTER — Inpatient Hospital Stay: Payer: Medicare Other | Attending: Hematology and Oncology

## 2022-03-31 DIAGNOSIS — Z5189 Encounter for other specified aftercare: Secondary | ICD-10-CM | POA: Diagnosis not present

## 2022-03-31 DIAGNOSIS — Z5112 Encounter for antineoplastic immunotherapy: Secondary | ICD-10-CM | POA: Diagnosis present

## 2022-03-31 DIAGNOSIS — Z7189 Other specified counseling: Secondary | ICD-10-CM

## 2022-03-31 DIAGNOSIS — D6959 Other secondary thrombocytopenia: Secondary | ICD-10-CM | POA: Insufficient documentation

## 2022-03-31 DIAGNOSIS — Z5111 Encounter for antineoplastic chemotherapy: Secondary | ICD-10-CM | POA: Insufficient documentation

## 2022-03-31 DIAGNOSIS — C8318 Mantle cell lymphoma, lymph nodes of multiple sites: Secondary | ICD-10-CM | POA: Insufficient documentation

## 2022-03-31 DIAGNOSIS — E43 Unspecified severe protein-calorie malnutrition: Secondary | ICD-10-CM | POA: Insufficient documentation

## 2022-03-31 DIAGNOSIS — D630 Anemia in neoplastic disease: Secondary | ICD-10-CM | POA: Diagnosis not present

## 2022-03-31 DIAGNOSIS — Z87891 Personal history of nicotine dependence: Secondary | ICD-10-CM | POA: Insufficient documentation

## 2022-03-31 MED ORDER — PEGFILGRASTIM-BMEZ 6 MG/0.6ML ~~LOC~~ SOSY
6.0000 mg | PREFILLED_SYRINGE | Freq: Once | SUBCUTANEOUS | Status: AC
  Administered 2022-03-31: 6 mg via SUBCUTANEOUS
  Filled 2022-03-31: qty 0.6

## 2022-04-04 ENCOUNTER — Other Ambulatory Visit: Payer: Self-pay | Admitting: Hematology

## 2022-04-04 DIAGNOSIS — C8318 Mantle cell lymphoma, lymph nodes of multiple sites: Secondary | ICD-10-CM

## 2022-04-04 DIAGNOSIS — Z7189 Other specified counseling: Secondary | ICD-10-CM

## 2022-04-19 ENCOUNTER — Ambulatory Visit: Payer: No Typology Code available for payment source

## 2022-04-19 ENCOUNTER — Other Ambulatory Visit: Payer: No Typology Code available for payment source

## 2022-04-19 ENCOUNTER — Ambulatory Visit: Payer: No Typology Code available for payment source | Admitting: Hematology

## 2022-04-20 ENCOUNTER — Ambulatory Visit: Payer: No Typology Code available for payment source

## 2022-04-22 ENCOUNTER — Ambulatory Visit: Payer: No Typology Code available for payment source

## 2022-04-24 ENCOUNTER — Other Ambulatory Visit: Payer: Self-pay

## 2022-04-24 DIAGNOSIS — C8318 Mantle cell lymphoma, lymph nodes of multiple sites: Secondary | ICD-10-CM

## 2022-04-25 ENCOUNTER — Inpatient Hospital Stay (HOSPITAL_BASED_OUTPATIENT_CLINIC_OR_DEPARTMENT_OTHER): Payer: Medicare Other | Admitting: Hematology

## 2022-04-25 ENCOUNTER — Inpatient Hospital Stay: Payer: Medicare Other

## 2022-04-25 ENCOUNTER — Other Ambulatory Visit: Payer: Self-pay

## 2022-04-25 VITALS — BP 133/79 | HR 69 | Temp 97.5°F | Resp 18

## 2022-04-25 VITALS — BP 118/73 | HR 64 | Temp 97.3°F | Resp 20 | Wt 197.8 lb

## 2022-04-25 DIAGNOSIS — Z5112 Encounter for antineoplastic immunotherapy: Secondary | ICD-10-CM | POA: Diagnosis not present

## 2022-04-25 DIAGNOSIS — E883 Tumor lysis syndrome: Secondary | ICD-10-CM

## 2022-04-25 DIAGNOSIS — C8318 Mantle cell lymphoma, lymph nodes of multiple sites: Secondary | ICD-10-CM

## 2022-04-25 DIAGNOSIS — Z7189 Other specified counseling: Secondary | ICD-10-CM

## 2022-04-25 DIAGNOSIS — Z5111 Encounter for antineoplastic chemotherapy: Secondary | ICD-10-CM

## 2022-04-25 LAB — CBC WITH DIFFERENTIAL (CANCER CENTER ONLY)
Abs Immature Granulocytes: 0 K/uL (ref 0.00–0.07)
Basophils Absolute: 0 K/uL (ref 0.0–0.1)
Basophils Relative: 1 %
Eosinophils Absolute: 0.1 K/uL (ref 0.0–0.5)
Eosinophils Relative: 4 %
HCT: 32.2 % — ABNORMAL LOW (ref 39.0–52.0)
Hemoglobin: 10.4 g/dL — ABNORMAL LOW (ref 13.0–17.0)
Immature Granulocytes: 0 %
Lymphocytes Relative: 17 %
Lymphs Abs: 0.4 K/uL — ABNORMAL LOW (ref 0.7–4.0)
MCH: 29.2 pg (ref 26.0–34.0)
MCHC: 32.3 g/dL (ref 30.0–36.0)
MCV: 90.4 fL (ref 80.0–100.0)
Monocytes Absolute: 0.3 K/uL (ref 0.1–1.0)
Monocytes Relative: 12 %
Neutro Abs: 1.6 K/uL — ABNORMAL LOW (ref 1.7–7.7)
Neutrophils Relative %: 66 %
Platelet Count: 169 K/uL (ref 150–400)
RBC: 3.56 MIL/uL — ABNORMAL LOW (ref 4.22–5.81)
RDW: 13.3 % (ref 11.5–15.5)
WBC Count: 2.5 K/uL — ABNORMAL LOW (ref 4.0–10.5)
nRBC: 0 % (ref 0.0–0.2)

## 2022-04-25 LAB — CMP (CANCER CENTER ONLY)
ALT: 10 U/L (ref 0–44)
AST: 17 U/L (ref 15–41)
Albumin: 4 g/dL (ref 3.5–5.0)
Alkaline Phosphatase: 60 U/L (ref 38–126)
Anion gap: 6 (ref 5–15)
BUN: 29 mg/dL — ABNORMAL HIGH (ref 8–23)
CO2: 32 mmol/L (ref 22–32)
Calcium: 9.8 mg/dL (ref 8.9–10.3)
Chloride: 105 mmol/L (ref 98–111)
Creatinine: 1.23 mg/dL (ref 0.61–1.24)
GFR, Estimated: >60 mL/min (ref >60)
Glucose, Bld: 86 mg/dL (ref 70–99)
Potassium: 3.2 mmol/L — ABNORMAL LOW (ref 3.5–5.1)
Sodium: 143 mmol/L (ref 135–145)
Total Bilirubin: 0.3 mg/dL (ref 0.3–1.2)
Total Protein: 7.5 g/dL (ref 6.5–8.1)

## 2022-04-25 LAB — CBC
HCT: 32.4 % — ABNORMAL LOW (ref 39.0–52.0)
Hemoglobin: 10.4 g/dL — ABNORMAL LOW (ref 13.0–17.0)
MCH: 29.1 pg (ref 26.0–34.0)
MCHC: 32.1 g/dL (ref 30.0–36.0)
MCV: 90.8 fL (ref 80.0–100.0)
Platelets: 162 10*3/uL (ref 150–400)
RBC: 3.57 MIL/uL — ABNORMAL LOW (ref 4.22–5.81)
RDW: 13.2 % (ref 11.5–15.5)
WBC: 2.5 10*3/uL — ABNORMAL LOW (ref 4.0–10.5)
nRBC: 0 % (ref 0.0–0.2)

## 2022-04-25 LAB — URIC ACID: Uric Acid, Serum: 10.3 mg/dL — ABNORMAL HIGH (ref 3.7–8.6)

## 2022-04-25 MED ORDER — POTASSIUM CHLORIDE CRYS ER 20 MEQ PO TBCR: 20.0000 meq | EXTENDED_RELEASE_TABLET | Freq: Two times a day (BID) | ORAL | 0 refills | Status: DC

## 2022-04-25 MED ORDER — SODIUM CHLORIDE 0.9 % IV SOLN
375.0000 mg/m2 | Freq: Once | INTRAVENOUS | Status: AC
  Administered 2022-04-25: 800 mg via INTRAVENOUS
  Filled 2022-04-25: qty 50

## 2022-04-25 MED ORDER — ACETAMINOPHEN 325 MG PO TABS
650.0000 mg | ORAL_TABLET | Freq: Once | ORAL | Status: AC
  Administered 2022-04-25: 650 mg via ORAL
  Filled 2022-04-25: qty 2

## 2022-04-25 MED ORDER — SODIUM CHLORIDE 0.9 % IV SOLN
70.0000 mg/m2 | Freq: Once | INTRAVENOUS | Status: AC
  Administered 2022-04-25: 150 mg via INTRAVENOUS
  Filled 2022-04-25: qty 6

## 2022-04-25 MED ORDER — SODIUM CHLORIDE 0.9 % IV SOLN
10.0000 mg | Freq: Once | INTRAVENOUS | Status: AC
  Administered 2022-04-25: 10 mg via INTRAVENOUS
  Filled 2022-04-25: qty 10

## 2022-04-25 MED ORDER — FAMOTIDINE IN NACL 20-0.9 MG/50ML-% IV SOLN
20.0000 mg | Freq: Once | INTRAVENOUS | Status: AC
  Administered 2022-04-25: 20 mg via INTRAVENOUS
  Filled 2022-04-25: qty 50

## 2022-04-25 MED ORDER — SODIUM CHLORIDE 0.9 % IV SOLN: Freq: Once | INTRAVENOUS | Status: AC

## 2022-04-25 MED ORDER — PALONOSETRON HCL INJECTION 0.25 MG/5ML
0.2500 mg | Freq: Once | INTRAVENOUS | Status: AC
  Administered 2022-04-25: 0.25 mg via INTRAVENOUS
  Filled 2022-04-25: qty 5

## 2022-04-25 MED ORDER — DIPHENHYDRAMINE HCL 25 MG PO CAPS
50.0000 mg | ORAL_CAPSULE | Freq: Once | ORAL | Status: AC
  Administered 2022-04-25: 50 mg via ORAL
  Filled 2022-04-25: qty 2

## 2022-04-26 ENCOUNTER — Inpatient Hospital Stay: Payer: Medicare Other

## 2022-04-26 VITALS — BP 109/60 | HR 72 | Temp 98.0°F | Resp 17

## 2022-04-26 DIAGNOSIS — Z5112 Encounter for antineoplastic immunotherapy: Secondary | ICD-10-CM | POA: Diagnosis not present

## 2022-04-26 DIAGNOSIS — C8318 Mantle cell lymphoma, lymph nodes of multiple sites: Secondary | ICD-10-CM

## 2022-04-26 DIAGNOSIS — Z7189 Other specified counseling: Secondary | ICD-10-CM

## 2022-04-26 MED ORDER — SODIUM CHLORIDE 0.9 % IV SOLN: Freq: Once | INTRAVENOUS | Status: AC

## 2022-04-26 MED ORDER — SODIUM CHLORIDE 0.9 % IV SOLN
70.0000 mg/m2 | Freq: Once | INTRAVENOUS | Status: AC
  Administered 2022-04-26: 150 mg via INTRAVENOUS
  Filled 2022-04-26: qty 6

## 2022-04-26 MED ORDER — SODIUM CHLORIDE 0.9 % IV SOLN
10.0000 mg | Freq: Once | INTRAVENOUS | Status: AC
  Administered 2022-04-26: 10 mg via INTRAVENOUS
  Filled 2022-04-26: qty 10

## 2022-04-26 NOTE — Patient Instructions (Signed)
[facility]  Discharge Instructions: Thank you for choosing [facility] to provide your oncology and hematology care.   If you have a lab appointment with the [facility], please go directly to the [facility] and check in at the registration area.   Wear comfortable clothing and clothing appropriate for easy access to any Portacath or PICC line.   We strive to give you quality time with your provider. You may need to reschedule your appointment if you arrive late (15 or more minutes).  Arriving late affects you and other patients whose appointments are after yours.  Also, if you miss three or more appointments without notifying the office, you may be dismissed from the clinic at the provider's discretion.      For prescription refill requests, have your pharmacy contact our office and allow 72 hours for refills to be completed.    Today you received the following chemotherapy and/or immunotherapy agents: Bendeka      To help prevent nausea and vomiting after your treatment, we encourage you to take your nausea medication as directed.  BELOW ARE SYMPTOMS THAT SHOULD BE REPORTED IMMEDIATELY: *FEVER GREATER THAN 100.4 F (38 C) OR HIGHER *CHILLS OR SWEATING *NAUSEA AND VOMITING THAT IS NOT CONTROLLED WITH YOUR NAUSEA MEDICATION *UNUSUAL SHORTNESS OF BREATH *UNUSUAL BRUISING OR BLEEDING *URINARY PROBLEMS (pain or burning when urinating, or frequent urination) *BOWEL PROBLEMS (unusual diarrhea, constipation, pain near the anus) TENDERNESS IN MOUTH AND THROAT WITH OR WITHOUT PRESENCE OF ULCERS (sore throat, sores in mouth, or a toothache) UNUSUAL RASH, SWELLING OR PAIN  UNUSUAL VAGINAL DISCHARGE OR ITCHING   Items with * indicate a potential emergency and should be followed up as soon as possible or go to the Emergency Department if any problems should occur.  Please show the CHEMOTHERAPY ALERT CARD or IMMUNOTHERAPY ALERT CARD at check-in to  the Emergency Department and triage nurse.  Should you have questions after your visit or need to cancel or reschedule your appointment, please contact [facility]  Dept: [phone]  and follow the prompts.  Office hours are 8:00 a.m. to 4:30 p.m. Monday - Friday. Please note that voicemails left after 4:00 p.m. may not be returned until the following business day.  We are closed weekends and major holidays. You have access to a nurse at all times for urgent questions. Please call the main number to the clinic Dept: [phone] and follow the prompts.   For any non-urgent questions, you may also contact your provider using MyChart. We now offer e-Visits for anyone 22 and older to request care online for non-urgent symptoms. For details visit [email or web address].   Also download the MyChart app! Go to the app store, search "MyChart", open the app, select [facility], and log in with your MyChart username and password.  Masks are optional in the cancer centers. If you would like for your care team to wear a mask while they are taking care of you, please let them know. For doctor visits, patients may have with them one support person who is at least 76 years old. At this time, visitors are not allowed in the infusion area.

## 2022-04-27 ENCOUNTER — Telehealth: Payer: Self-pay | Admitting: Hematology

## 2022-04-27 NOTE — Telephone Encounter (Signed)
Scheduled follow-up appointments per 6/27 los. Patient's wife is aware.

## 2022-04-28 ENCOUNTER — Inpatient Hospital Stay: Payer: Medicare Other

## 2022-04-28 ENCOUNTER — Other Ambulatory Visit: Payer: Self-pay

## 2022-04-28 ENCOUNTER — Telehealth: Payer: Self-pay | Admitting: Hematology

## 2022-04-28 VITALS — BP 138/77 | HR 76 | Temp 98.1°F | Resp 16

## 2022-04-28 DIAGNOSIS — Z5112 Encounter for antineoplastic immunotherapy: Secondary | ICD-10-CM | POA: Diagnosis not present

## 2022-04-28 DIAGNOSIS — Z7189 Other specified counseling: Secondary | ICD-10-CM

## 2022-04-28 DIAGNOSIS — C8318 Mantle cell lymphoma, lymph nodes of multiple sites: Secondary | ICD-10-CM

## 2022-04-28 MED ORDER — PEGFILGRASTIM-BMEZ 6 MG/0.6ML ~~LOC~~ SOSY
6.0000 mg | PREFILLED_SYRINGE | Freq: Once | SUBCUTANEOUS | Status: AC
  Administered 2022-04-28: 6 mg via SUBCUTANEOUS
  Filled 2022-04-28: qty 0.6

## 2022-04-28 NOTE — Telephone Encounter (Signed)
Patient's wife called to reschedule upcoming appointment.

## 2022-05-01 ENCOUNTER — Encounter: Payer: Self-pay | Admitting: Hematology

## 2022-05-01 NOTE — Progress Notes (Signed)
HEMATOLOGY/ONCOLOGY CLINIC VISIT NOTE  Date of Service: 04/25/2022   Patient Care Team: [facility] [place] as PCP - General (General Practice)  CHIEF COMPLAINTS/PURPOSE OF CONSULTATION:  Follow-up prior to cycle 5 of Bendamustine Rituxan chemotherapy  HISTORY OF PRESENTING ILLNESS:  Please see previous note for details of initial presentation  INTERVAL HISTORY  [name] is here for continued evaluation and management prior to his fifth and last planned cycle of Bendamustine Rituxan for mantle cell lymphoma .chemo-immunotherapy. His PET CT scan was previously discussed and showed good response to treatment. He notes no acute new symptoms no fevers no chills no night sweats No new lumps or bumps. Labs done today were discussed in detail with the patient. No new significant toxicities from his previous cycle of Bendamustine Rituxan chemoimmunotherapy.  MEDICAL HISTORY:  Past Medical History:  Diagnosis Date   Arthritis    Chronic kidney disease, stage 3b ([place]) 12/09/2021   Dyslipidemia    GERD (gastroesophageal reflux disease)    Hypertension    PAF (paroxysmal atrial fibrillation) (HCC)    Prostate cancer ([place]) 03/2021   s/p radiation therapy   PTSD (post-traumatic stress disorder)     SURGICAL HISTORY: Past Surgical History:  Procedure Laterality Date   LIPOMA EXCISION Right 12/09/2021   Procedure: RIGHT SUPERCLAVICULAR LYMPH NODE EXCISION;  Surgeon: Ileana Roup, MD;  Location: [facility] OR;  Service: General;  Laterality: Right;    SOCIAL HISTORY: Social History   Socioeconomic History   Marital status: Married    Spouse name: Not on file   Number of children: Not on file   Years of education: Not on file   Highest education level: Not on file  Occupational History   Occupation: retired  Tobacco Use   Smoking status: Former    Packs/day: 1.00    Years: 15.00    Total pack years: 15.00    Types: Cigarettes   Smokeless tobacco: Never   Substance and Sexual Activity   Alcohol use: Not Currently    Comment: h/o heavy use   Drug use: Not Currently    Types: Marijuana, Cocaine    Comment: remote   Sexual activity: Not on file  Other Topics Concern   Not on file  Social History Narrative   Not on file   Social Determinants of Health   Financial Resource Strain: Not on file  Food Insecurity: Not on file  Transportation Needs: Not on file  Physical Activity: Not on file  Stress: Not on file  Social Connections: Not on file  Intimate Partner Violence: Not on file    FAMILY HISTORY: Family History  Problem Relation Age of Onset   Cancer Neg Hx     ALLERGIES:  is allergic to sildenafil.  MEDICATIONS:  Current Outpatient Medications  Medication Sig Dispense Refill   potassium chloride SA (KLOR-CON M) 20 MEQ tablet Take 1 tablet (20 mEq total) by mouth 2 (two) times daily. 60 tablet 0   acyclovir (ZOVIRAX) 400 MG tablet TAKE 1 TABLET(400 MG) BY MOUTH DAILY 30 tablet 3   ammonium lactate (LAC-HYDRIN) 12 % lotion Apply 1 application topically 2 (two) times daily as needed for dry skin.     atorvastatin (LIPITOR) 20 MG tablet Take 10 mg by mouth at bedtime.     Cholecalciferol (VITAMIN D3 PO) Take 1 tablet by mouth every morning.     dexamethasone (DECADRON) 4 MG tablet Take 2 tablets (8 mg total) by mouth daily. Start the day after  bendamustine chemotherapy for 2 days. Take with food. 30 tablet 1   diclofenac Sodium (VOLTAREN) 1 % GEL Apply 2 g topically 4 (four) times daily as needed (pain).     feeding supplement (ENSURE ENLIVE / ENSURE PLUS) LIQD Take 237 mLs by mouth 2 (two) times daily between meals. 237 mL 12   flecainide (TAMBOCOR) 100 MG tablet Take 100 mg by mouth every 12 (twelve) hours.     fluticasone (FLONASE) 50 MCG/ACT nasal spray Place 1 spray into both nostrils daily as needed for allergies or rhinitis.     hydroxypropyl methylcellulose / hypromellose (ISOPTO TEARS / GONIOVISC) 2.5 % ophthalmic  solution Place 2 drops into both eyes 2 (two) times daily as needed for dry eyes.     LORazepam (ATIVAN) 0.5 MG tablet Take 1 tablet (0.5 mg total) by mouth every 6 (six) hours as needed (Nausea or vomiting). 30 tablet 0   methocarbamol (ROBAXIN) 500 MG tablet Take 500 mg by mouth every 8 (eight) hours as needed for muscle spasms.     metoprolol tartrate (LOPRESSOR) 25 MG tablet Take 12.5 mg by mouth 2 (two) times daily. Take with flecainide     mirtazapine (REMERON) 30 MG tablet Take 30 mg by mouth at bedtime.     omeprazole (PRILOSEC) 20 MG capsule Take 20 mg by mouth daily before breakfast.     ondansetron (ZOFRAN) 8 MG tablet Take 1 tablet (8 mg total) by mouth 2 (two) times daily as needed for refractory nausea / vomiting. Start on day 2 after bendamustine chemo. 30 tablet 1   prazosin (MINIPRESS) 2 MG capsule Take 6 mg by mouth at bedtime.     predniSONE (DELTASONE) 20 MG tablet Take 1 tablet (20 mg total) by mouth daily with breakfast. 5 tablet 0   prochlorperazine (COMPAZINE) 10 MG tablet Take 1 tablet (10 mg total) by mouth every 6 (six) hours as needed (Nausea or vomiting). 30 tablet 1   terbinafine (LAMISIL) 1 % cream Apply 1 application topically 2 (two) times daily as needed (athlete's foot).     torsemide (DEMADEX) 10 MG tablet Take 2 tablets (20 mg total) by mouth daily. 15 tablet 0   triamcinolone cream (KENALOG) 0.1 % Apply 1 application topically 2 (two) times daily as needed (rash).     No current facility-administered medications for this visit.    REVIEW OF SYSTEMS:   10 Point review of Systems was done is negative except as noted above.  PHYSICAL EXAMINATION: .BP 118/73   Pulse 64   Temp (!) 97.3 F (36.3 C)   Resp 20   Wt 197 lb 12.8 oz (89.7 kg)   SpO2 100%   BMI 24.08 kg/m  NAD GENERAL:alert, in no acute distress and comfortable SKIN: no acute rashes, no significant lesions EYES: conjunctiva are pink and non-injected, sclera anicteric OROPHARYNX: MMM, no  exudates, no oropharyngeal erythema or ulceration NECK: supple, no JVD LYMPH:  no palpable lymphadenopathy in the cervical, axillary or inguinal regions LUNGS: clear to auscultation b/l with normal respiratory effort HEART: regular rate & rhythm ABDOMEN:  normoactive bowel sounds , non tender, not distended. Extremity: no pedal edema PSYCH: alert & oriented x 3 with fluent speech NEURO: no focal motor/sensory deficits   LABORATORY DATA:  I have reviewed the data as listed  .    Latest Ref Rng & Units 04/25/2022   10:05 AM 03/28/2022    8:31 AM 03/21/2022   10:28 AM  CBC  WBC 4.0 - 10.5  K/uL 4.0 - 10.5 K/uL 2.5    2.5  2.1  1.9   Hemoglobin 13.0 - 17.0 g/dL 13.0 - 17.0 g/dL 10.4    10.4  9.7  9.3   Hematocrit 39.0 - 52.0 % 39.0 - 52.0 % 32.4    32.2  31.2  28.6   Platelets 150 - 400 K/uL 150 - 400 K/uL 162    169  159  165   ANC 700     Latest Ref Rng & Units 04/25/2022   10:05 AM 03/28/2022    8:31 AM 03/21/2022   10:28 AM  CMP  Glucose 70 - 99 mg/dL 86  99  110   BUN 8 - 23 mg/dL '29  23  19   '$ Creatinine 0.61 - 1.24 mg/dL 1.23  1.17  1.19   Sodium 135 - 145 mmol/L 143  139  141   Potassium 3.5 - 5.1 mmol/L 3.2  3.8  3.4   Chloride 98 - 111 mmol/L 105  102  107   CO2 22 - 32 mmol/L 32  27  29   Calcium 8.9 - 10.3 mg/dL 9.8  10.0  9.7   Total Protein 6.5 - 8.1 g/dL 7.5  8.0  7.7   Total Bilirubin 0.3 - 1.2 mg/dL 0.3  0.3  0.3   Alkaline Phos 38 - 126 U/L 60  54  56   AST 15 - 41 U/L '17  16  16   '$ ALT 0 - 44 U/L '10  7  8    '$ . Uric acid 4.9 Surgical Pathology  CASE: [id number]  PATIENT: Jerry Tran  Flow Pathology Report  Clinical history: Mantle Cell Lymphoma of lymph nodes of multiple  regions  DIAGNOSIS:  -Monoclonal B-cell population identified  -See comment   COMMENT:   The findings are consistent with involvement by previously known mantle  cell lymphoma.    RADIOGRAPHIC STUDIES: I have personally reviewed the radiological images as listed and  agreed with the findings in the report.  No results found.  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 PET CT scan 12/29/2021  1. Enlarged hypermetabolic cervical, bilateral axillary, retroperitoneal, pelvic, and inguinal lymph nodes. [place] 4. 2. Subcentimeter mediastinal lymph nodes with uptake similar to mediastinal blood pool. 3. Focal hypermetabolic uptake seen at the head of the right clavicle, concerning for osseous involvement. 4. Splenomegaly. 5. Increased tonsillar soft tissue, right greater than left, concerning for lymphomatous involvement.  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  3) moderate to severe protein calorie malnutrition-much improved patient is gaining weight and has been eating much better. . Wt Readings from Last 3 Encounters:  04/25/22 197 lb 12.8 oz (89.7 kg)  03/28/22 195 lb (88.5 kg)  03/21/22 198 lb 4.8 oz (89.9 kg)     4) acute renal injury-likely due poor p.o. intake.  Due to spontaneous tumor lysis.  Also could be due to his hematuria and some element of urinary obstruction.  Now resolved  5) massive splenomegaly due to mantle cell lymphoma.  Now resolved  on repeat imaging-  6) status post tumor lysis syndrome  PLAN -Labs done today were discussed in detail with the patient. CBC shows hemoglobin of 10.4 with platelets 169k hemoglobin stable at 10.4. -Patient notes no primary toxicities  from cycle 4 Bendamustine Rituxan chemoimmunotherapy. -Patient appropriate to proceed with cycle 5 of Bendamustine Rituxan chemotherapy which would be his last planned induction treatment. We will plan to repeat PET CT scan in 4 weeks to reevaluate posttreatment response of his mantle cell lymphoma.  Orders Placed This Encounter  Procedures   Differential ([facility] Only)    Standing Status:   Future    Standing Expiration Date:   04/26/2023    Follow-up Labs in 4 weeks PET/CT in 4 weeks Please cancel all appointments associated with next cycle of Bendamustine Rituxan scheduled for end of July. Return to clinic with Dr. Irene Limbo in 5 weeks  The total time spent in the appointment was 30 minutes*.  All of the patient's questions were answered with apparent satisfaction. The patient knows to call the clinic with any problems, questions or concerns.   Sullivan Lone MD MS AAHIVMS [facility] Hematology/Oncology Physician [facility]  .*Total Encounter Time as defined by the Centers for Medicare and Medicaid Services includes, in addition to the face-to-face time of a patient visit (documented in the note above) non-face-to-face time: obtaining and reviewing outside history, ordering and reviewing medications, tests or procedures, care coordination (communications with other health care professionals or caregivers) and documentation in the medical record.  Marland Kitchen Brunetta Genera MD

## 2022-05-22 ENCOUNTER — Other Ambulatory Visit: Payer: Self-pay

## 2022-05-23 ENCOUNTER — Ambulatory Visit: Payer: No Typology Code available for payment source | Admitting: Hematology

## 2022-05-23 ENCOUNTER — Ambulatory Visit: Payer: No Typology Code available for payment source

## 2022-05-23 ENCOUNTER — Other Ambulatory Visit: Payer: Medicare Other

## 2022-05-23 ENCOUNTER — Other Ambulatory Visit: Payer: No Typology Code available for payment source

## 2022-05-24 ENCOUNTER — Ambulatory Visit: Payer: No Typology Code available for payment source

## 2022-05-24 ENCOUNTER — Inpatient Hospital Stay: Payer: Medicare Other

## 2022-05-25 ENCOUNTER — Encounter (HOSPITAL_COMMUNITY)
Admission: RE | Admit: 2022-05-25 | Discharge: 2022-05-25 | Disposition: A | Discharge status: Home / Self Care | Payer: Medicare Other | Source: Ambulatory Visit | Attending: Hematology | Admitting: Hematology

## 2022-05-25 ENCOUNTER — Other Ambulatory Visit: Payer: Self-pay

## 2022-05-25 ENCOUNTER — Inpatient Hospital Stay: Payer: Medicare Other | Attending: Hematology and Oncology

## 2022-05-25 DIAGNOSIS — Z5111 Encounter for antineoplastic chemotherapy: Secondary | ICD-10-CM | POA: Diagnosis present

## 2022-05-25 DIAGNOSIS — C8318 Mantle cell lymphoma, lymph nodes of multiple sites: Secondary | ICD-10-CM

## 2022-05-25 LAB — CMP (CANCER CENTER ONLY)
ALT: 9 U/L (ref 0–44)
AST: 17 U/L (ref 15–41)
Albumin: 3.9 g/dL (ref 3.5–5.0)
Alkaline Phosphatase: 62 U/L (ref 38–126)
Anion gap: 6 (ref 5–15)
BUN: 20 mg/dL (ref 8–23)
CO2: 28 mmol/L (ref 22–32)
Calcium: 9.6 mg/dL (ref 8.9–10.3)
Chloride: 109 mmol/L (ref 98–111)
Creatinine: 1.03 mg/dL (ref 0.61–1.24)
GFR, Estimated: >60 mL/min (ref >60)
Glucose, Bld: 90 mg/dL (ref 70–99)
Potassium: 3.1 mmol/L — ABNORMAL LOW (ref 3.5–5.1)
Sodium: 143 mmol/L (ref 135–145)
Total Bilirubin: 0.4 mg/dL (ref 0.3–1.2)
Total Protein: 7.4 g/dL (ref 6.5–8.1)

## 2022-05-25 LAB — CBC WITH DIFFERENTIAL (CANCER CENTER ONLY)
Abs Immature Granulocytes: 0 10*3/uL (ref 0.00–0.07)
Basophils Absolute: 0 10*3/uL (ref 0.0–0.1)
Basophils Relative: 1 %
Eosinophils Absolute: 0.1 10*3/uL (ref 0.0–0.5)
Eosinophils Relative: 3 %
HCT: 31.4 % — ABNORMAL LOW (ref 39.0–52.0)
Hemoglobin: 10.3 g/dL — ABNORMAL LOW (ref 13.0–17.0)
Immature Granulocytes: 0 %
Lymphocytes Relative: 31 %
Lymphs Abs: 0.7 10*3/uL (ref 0.7–4.0)
MCH: 28.5 pg (ref 26.0–34.0)
MCHC: 32.8 g/dL (ref 30.0–36.0)
MCV: 87 fL (ref 80.0–100.0)
Monocytes Absolute: 0.2 10*3/uL (ref 0.1–1.0)
Monocytes Relative: 11 %
Neutro Abs: 1.2 10*3/uL — ABNORMAL LOW (ref 1.7–7.7)
Neutrophils Relative %: 54 %
Platelet Count: 151 10*3/uL (ref 150–400)
RBC: 3.61 MIL/uL — ABNORMAL LOW (ref 4.22–5.81)
RDW: 13.4 % (ref 11.5–15.5)
WBC Count: 2.2 10*3/uL — ABNORMAL LOW (ref 4.0–10.5)
nRBC: 0 % (ref 0.0–0.2)

## 2022-05-25 LAB — LACTATE DEHYDROGENASE: LDH: 151 U/L (ref 98–192)

## 2022-05-25 LAB — GLUCOSE, CAPILLARY: Glucose-Capillary: 91 mg/dL (ref 70–99)

## 2022-05-25 MED ORDER — FLUDEOXYGLUCOSE F - 18 (FDG) INJECTION
10.0000 | Freq: Once | INTRAVENOUS | Status: AC
  Administered 2022-05-25: 10.2 via INTRAVENOUS

## 2022-05-26 ENCOUNTER — Ambulatory Visit: Payer: No Typology Code available for payment source

## 2022-05-31 ENCOUNTER — Other Ambulatory Visit: Payer: Self-pay

## 2022-05-31 ENCOUNTER — Inpatient Hospital Stay: Payer: Medicare Other | Attending: Hematology and Oncology | Admitting: Hematology

## 2022-05-31 VITALS — BP 106/63 | HR 57 | Temp 97.5°F | Resp 20 | Wt 200.0 lb

## 2022-05-31 DIAGNOSIS — D696 Thrombocytopenia, unspecified: Secondary | ICD-10-CM | POA: Insufficient documentation

## 2022-05-31 DIAGNOSIS — D649 Anemia, unspecified: Secondary | ICD-10-CM | POA: Insufficient documentation

## 2022-05-31 DIAGNOSIS — C8318 Mantle cell lymphoma, lymph nodes of multiple sites: Secondary | ICD-10-CM | POA: Diagnosis present

## 2022-06-03 ENCOUNTER — Encounter (HOSPITAL_COMMUNITY): Payer: Self-pay | Admitting: Emergency Medicine

## 2022-06-03 ENCOUNTER — Other Ambulatory Visit: Payer: Self-pay

## 2022-06-03 ENCOUNTER — Emergency Department (HOSPITAL_COMMUNITY)
Admission: EM | Admit: 2022-06-03 | Discharge: 2022-06-03 | Disposition: A | Discharge status: Home / Self Care | Payer: No Typology Code available for payment source | Attending: Emergency Medicine | Admitting: Emergency Medicine

## 2022-06-03 ENCOUNTER — Emergency Department (HOSPITAL_COMMUNITY): Payer: No Typology Code available for payment source

## 2022-06-03 DIAGNOSIS — Z85528 Personal history of other malignant neoplasm of kidney: Secondary | ICD-10-CM | POA: Insufficient documentation

## 2022-06-03 DIAGNOSIS — S0081XA Abrasion of other part of head, initial encounter: Secondary | ICD-10-CM | POA: Insufficient documentation

## 2022-06-03 DIAGNOSIS — Z79899 Other long term (current) drug therapy: Secondary | ICD-10-CM | POA: Diagnosis not present

## 2022-06-03 DIAGNOSIS — R55 Syncope and collapse: Secondary | ICD-10-CM | POA: Insufficient documentation

## 2022-06-03 DIAGNOSIS — I129 Hypertensive chronic kidney disease with stage 1 through stage 4 chronic kidney disease, or unspecified chronic kidney disease: Secondary | ICD-10-CM | POA: Diagnosis not present

## 2022-06-03 DIAGNOSIS — N183 Chronic kidney disease, stage 3 unspecified: Secondary | ICD-10-CM | POA: Diagnosis not present

## 2022-06-03 DIAGNOSIS — W01198A Fall on same level from slipping, tripping and stumbling with subsequent striking against other object, initial encounter: Secondary | ICD-10-CM | POA: Diagnosis not present

## 2022-06-03 DIAGNOSIS — S0990XA Unspecified injury of head, initial encounter: Secondary | ICD-10-CM | POA: Diagnosis present

## 2022-06-03 LAB — BASIC METABOLIC PANEL
Anion gap: 8 (ref 5–15)
BUN: 27 mg/dL — ABNORMAL HIGH (ref 8–23)
CO2: 25 mmol/L (ref 22–32)
Calcium: 9.5 mg/dL (ref 8.9–10.3)
Chloride: 105 mmol/L (ref 98–111)
Creatinine, Ser: 1.05 mg/dL (ref 0.61–1.24)
GFR, Estimated: >60 mL/min (ref >60)
Glucose, Bld: 89 mg/dL (ref 70–99)
Potassium: 4 mmol/L (ref 3.5–5.1)
Sodium: 138 mmol/L (ref 135–145)

## 2022-06-03 LAB — CBC
HCT: 38.4 % — ABNORMAL LOW (ref 39.0–52.0)
Hemoglobin: 11.3 g/dL — ABNORMAL LOW (ref 13.0–17.0)
MCH: 28.4 pg (ref 26.0–34.0)
MCHC: 29.4 g/dL — ABNORMAL LOW (ref 30.0–36.0)
MCV: 96.5 fL (ref 80.0–100.0)
Platelets: 149 10*3/uL — ABNORMAL LOW (ref 150–400)
RBC: 3.98 MIL/uL — ABNORMAL LOW (ref 4.22–5.81)
RDW: 13.4 % (ref 11.5–15.5)
WBC: 2.3 10*3/uL — ABNORMAL LOW (ref 4.0–10.5)
nRBC: 0 % (ref 0.0–0.2)

## 2022-06-03 MED ORDER — SODIUM CHLORIDE 0.9 % IV BOLUS
1000.0000 mL | Freq: Once | INTRAVENOUS | Status: AC
  Administered 2022-06-03: 1000 mL via INTRAVENOUS

## 2022-06-03 NOTE — Discharge Instructions (Addendum)
Please return to the ED with any new or worsening signs or symptoms Please follow-up with the doctor who placed on any medication.  Please explore other treatment options. Please continue to remain hydrated.  Please continue to hydrate yourself as an outpatient today. Please read the attached guide concerning near syncope

## 2022-06-03 NOTE — ED Provider Notes (Signed)
, [facility] DEPT Provider Note   CSN: [id number] Arrival date & time: 06/03/22  1220     History  Chief Complaint  Patient presents with   Loss of Consciousness    Jerry Tran is a 76 y.o. male with medical history of CKD stage III state cancer, GERD, hypertension, paroxysmal A-fib, PTSD.  The patient presents to ED for evaluation of loss of consciousness and fall.  Patient reports that this morning he was sitting up on the side of his bed using bedside urinal.  Patient reports that he has been using bedside urinal recently because he has changed medications Ativan causing him to urinate more frequently.  The patient reports that he is on a "potassium pill" that is making him pee more often.  The patient is unable to tell with any with this pill.  The patient states that he started taking this pill on Thursday and has not been drinking enough fluid to keep up with the amount of urinating he has been doing.  Patient reports that while urinating this morning he had sudden onset loss of balance causing him to fall forward striking his forehead on the carpet.  The patient denies loss of consciousness, blood thinners.  The patient denies headache, neck pain, loss of consciousness, back pain, nausea or vomiting.   Loss of Consciousness Associated symptoms: no headaches, no nausea and no vomiting        Home Medications Prior to Admission medications   Medication Sig Start Date End Date Taking? Authorizing Provider  acyclovir (ZOVIRAX) 400 MG tablet TAKE 1 TABLET(400 MG) BY MOUTH DAILY 04/04/22  Yes Brunetta Genera, MD  ammonium lactate (LAC-HYDRIN) 12 % lotion Apply 1 application topically 2 (two) times daily as needed for dry skin.   Yes [provider]  atorvastatin (LIPITOR) 20 MG tablet Take 10 mg by mouth at bedtime.   Yes [provider]  Cholecalciferol (VITAMIN D3 PO) Take 1 tablet by mouth every morning.   Yes [provider]  diclofenac Sodium (VOLTAREN) 1 % GEL Apply 2 g topically 4 (four) times daily as needed (pain).   Yes [provider]  feeding supplement (ENSURE ENLIVE / ENSURE PLUS) LIQD Take 237 mLs by mouth 2 (two) times daily between meals. 12/11/21  Yes Regalado, Belkys A, MD  flecainide (TAMBOCOR) 100 MG tablet Take 100 mg by mouth every 12 (twelve) hours.   Yes [provider]  fluticasone (FLONASE) 50 MCG/ACT nasal spray Place 1 spray into both nostrils daily as needed for allergies or rhinitis.   Yes [provider]  hydroxypropyl methylcellulose / hypromellose (ISOPTO TEARS / GONIOVISC) 2.5 % ophthalmic solution Place 2 drops into both eyes 2 (two) times daily as needed for dry eyes.   Yes [provider]  metoprolol tartrate (LOPRESSOR) 25 MG tablet Take 12.5 mg by mouth 2 (two) times daily. Take with flecainide   Yes [provider]  mirtazapine (REMERON) 30 MG tablet Take 30 mg by mouth at bedtime. 01/31/22  Yes [provider]  omeprazole (PRILOSEC) 20 MG capsule Take 20 mg by mouth daily before breakfast.   Yes [provider]  potassium chloride SA (KLOR-CON M) 20 MEQ tablet Take 1 tablet (20 mEq total) by mouth 2 (two) times daily. 04/25/22  Yes Brunetta Genera, MD  prazosin (MINIPRESS) 2 MG capsule Take 6 mg by mouth at bedtime.   Yes [provider]  torsemide (DEMADEX) 10 MG tablet Take 20  mg by mouth daily.   Yes [provider]  LORazepam (ATIVAN) 0.5 MG tablet Take 1 tablet (0.5 mg total) by mouth every 6 (six) hours as needed (Nausea or vomiting). Patient not taking: Reported on 06/03/2022 12/16/21   Brunetta Genera, MD  ondansetron (ZOFRAN) 8 MG tablet Take 1 tablet (8 mg total) by mouth 2 (two) times daily as needed for refractory nausea / vomiting. Start on day 2 after bendamustine chemo. 12/16/21   Brunetta Genera, MD  prochlorperazine (COMPAZINE) 10 MG tablet Take 1 tablet (10 mg total) by mouth  every 6 (six) hours as needed (Nausea or vomiting). 12/16/21   Brunetta Genera, MD  terbinafine (LAMISIL) 1 % cream Apply 1 application topically 2 (two) times daily as needed (athlete's foot).    [provider]  triamcinolone cream (KENALOG) 0.1 % Apply 1 application topically 2 (two) times daily as needed (rash).    [provider]      Allergies    Sildenafil    Review of Systems   Review of Systems  Cardiovascular:  Positive for syncope.  Gastrointestinal:  Negative for nausea and vomiting.  Musculoskeletal:  Negative for back pain and neck pain.  Neurological:  Negative for syncope and headaches.  All other systems reviewed and are negative.   Physical Exam Updated Vital Signs BP 131/86   Pulse (!) 58   Temp 97.9 F (36.6 C) (Oral)   Resp 15   Ht '6\' 4"'$  (1.93 m)   Wt 88.5 kg   SpO2 100%   BMI 23.74 kg/m  Physical Exam Vitals and nursing note reviewed.  Constitutional:      General: He is not in acute distress.    Appearance: Normal appearance. He is not ill-appearing, toxic-appearing or diaphoretic.  HENT:     Head: Normocephalic and atraumatic.     Nose: Nose normal. No congestion.     Mouth/Throat:     Mouth: Mucous membranes are moist.     Pharynx: Oropharynx is clear.  Eyes:     Extraocular Movements: Extraocular movements intact.     Conjunctiva/sclera: Conjunctivae normal.     Pupils: Pupils are equal, round, and reactive to light.  Cardiovascular:     Rate and Rhythm: Normal rate and regular rhythm.  Pulmonary:     Effort: Pulmonary effort is normal.     Breath sounds: Normal breath sounds. No wheezing.  Abdominal:     General: Abdomen is flat. Bowel sounds are normal.     Palpations: Abdomen is soft.     Tenderness: There is no abdominal tenderness.  Musculoskeletal:     Cervical back: Normal range of motion and neck supple. No tenderness.  Skin:    General: Skin is warm and dry.     Capillary Refill: Capillary refill takes  less than 2 seconds.       Neurological:     General: No focal deficit present.     Mental Status: He is alert and oriented to person, place, and time.     GCS: GCS eye subscore is 4. GCS verbal subscore is 5. GCS motor subscore is 6.     Cranial Nerves: Cranial nerves 2-12 are intact. No cranial nerve deficit.     Sensory: Sensation is intact. No sensory deficit.     Motor: Motor function is intact. No weakness.     Coordination: Coordination is intact. Heel to [facility] Test normal.     ED Results / Procedures / Treatments  Labs (all labs ordered are listed, but only abnormal results are displayed) Labs Reviewed  CBC - Abnormal; Notable for the following components:      Result Value   WBC 2.3 (*)    RBC 3.98 (*)    Hemoglobin 11.3 (*)    HCT 38.4 (*)    MCHC 29.4 (*)    Platelets 149 (*)    All other components within normal limits  BASIC METABOLIC PANEL - Abnormal; Notable for the following components:   BUN 27 (*)    All other components within normal limits    EKG None  Radiology CT Head Wo Contrast  Result Date: 06/03/2022 CLINICAL DATA:  Head trauma EXAM: CT HEAD WITHOUT CONTRAST TECHNIQUE: Contiguous axial images were obtained from the base of the skull through the vertex without intravenous contrast. RADIATION DOSE REDUCTION: This exam was performed according to the departmental dose-optimization program which includes automated exposure control, adjustment of the mA and/or kV according to patient size and/or use of iterative reconstruction technique. COMPARISON:  None Available. FINDINGS: Brain: No evidence of acute infarction, acute hemorrhage, hydrocephalus, or mass lesion/mass effect. There is a very small, nonacute appearing low-attenuation right hemispheric subdural collection measuring no greater than 0.3 cm (series 4, image 44). Vascular: No hyperdense vessel or unexpected calcification. Skull: Normal. Negative for fracture or focal lesion. Sinuses/Orbits: No acute  finding. Other: None. IMPRESSION: 1. No acute intracranial pathology. 2. 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 Electronically Signed   By: Delanna Ahmadi M.D.   On: 06/03/2022 13:47    Procedures Procedures   Medications Ordered in ED Medications  sodium chloride 0.9 % bolus 1,000 mL (1,000 mLs Intravenous New Bag/Given 06/03/22 1405)    ED Course/ Medical Decision Making/ A&P Clinical Course as of 06/03/22 1548  Sat Jun 03, 2022  1539 Lightheaded episode while peeing at side of bed. Recent frequent urination 2/2 new spironolactone.   Follow up EKG, follow up with PCP [CP]    Clinical Course User Index [CP] Anselmo Pickler, PA-C                           Medical Decision Making Amount and/or Complexity of Data Reviewed Labs: ordered. Radiology: ordered. ECG/medicine tests: ordered.   76 year old male presents to ED for evaluation.  Please see HPI for further details.  On examination, patient afebrile and nontachycardic.  Patient lung sounds clear bilaterally, not hypoxic on room air.  Patient abdomen soft and compressible all 4 quadrants.  The patient neurological examination shows no focal neurodeficits.  The patient does have a small abrasion to the left side of his forehead, no laceration.  Patient worked up utilizing the following labs and imaging studies interpreted by me personally: - CBC at baseline - BMP unremarkable - CT head shows no intracranial abnormality, acute findings - EKG sinus rhythm  Patient treated with 1 L normal saline.  The patient states after 1 L normal saline he feels much better.  The patient is requesting food at this time.  Patient provided with ham sandwich, rice krispy treat and soda.  At this time, most likely cause of patient lightheaded/dizzy spell due to volume loss secondary to new diuretic.  The  patient will be advised to follow-up with doctor who placed him on this medication and explore other treatment options.  The patient was  given return precautions and he voiced understanding.  The patient had all of his questions answered to his satisfaction.  The patient is stable at this time for discharge home.  Final Clinical Impression(s) / ED Diagnoses Final diagnoses:  Near syncope    Rx / DC Orders ED Discharge Orders     None         Lawana Chambers 06/03/22 1548    Godfrey Pick, MD 06/05/22 [phone]

## 2022-06-03 NOTE — ED Notes (Signed)
Pt A&OX4 ambulatory at d/c with independent steady gait. Pt verbalized understanding of d/c instructions and follow up care. 

## 2022-06-03 NOTE — ED Triage Notes (Signed)
Pt reports he was standing up using the urinal when he became lightheaded and fell forward. Pt hit forehead, abrasion noted. Pt states he feels better since drinking water.

## 2022-06-05 ENCOUNTER — Other Ambulatory Visit: Payer: Self-pay

## 2022-06-07 ENCOUNTER — Encounter: Payer: Self-pay | Admitting: Hematology

## 2022-06-07 NOTE — Progress Notes (Signed)
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 is here for continued evaluation and management of his mantle cell lymphoma.  He has completed 5 cycles of Bendamustine Rituxan No. 1 dose of Rituxan as a part of his primary induction treatment. He notes that he has recovered well from the chemotherapy and has no significant fatigue at this time.  No abdominal pain or distention.  Leg swelling has resolved.  No infection issues since his last clinic visit. PET CT scan done on 05/25/2022 was reviewed in detail with him and shows complete metabolic response to treatment with resolution of lymphadenopathy. We discussed his lab results today in detail.  We discussed plan for continued maintenance Rituxan for at least 2 to 3 years if tolerated.  MEDICAL HISTORY:  Past Medical History:  Diagnosis Date   Arthritis    Chronic kidney disease, stage 3b ([address]) 12/09/2021   Dyslipidemia    GERD (gastroesophageal reflux disease)    Hypertension    PAF (paroxysmal atrial fibrillation) (HCC)    Prostate cancer ([place]) 03/2021   s/p radiation therapy   PTSD (post-traumatic stress disorder)     SURGICAL HISTORY: Past Surgical History:  Procedure Laterality Date   LIPOMA EXCISION Right 12/09/2021   Procedure: RIGHT SUPERCLAVICULAR LYMPH NODE EXCISION;  Surgeon: Ileana Roup, MD;  Location: [facility] OR;  Service: General;  Laterality: Right;    SOCIAL HISTORY: Social History   Socioeconomic History   Marital status: Married    Spouse name: Not on file   Number of children: Not on file   Years of education: Not on file   Highest education level: Not  on file  Occupational History   Occupation: retired  Tobacco Use   Smoking status: Former    Packs/day: 1.00    Years: 15.00    Total pack years: 15.00    Types: Cigarettes   Smokeless tobacco: Never  Substance and Sexual Activity   Alcohol use: Not Currently    Comment: h/o heavy use   Drug use: Not Currently    Types: Marijuana, Cocaine    Comment: remote   Sexual activity: Not on file  Other Topics Concern   Not on file  Social History Narrative   Not on file   Social Determinants of Health   Financial Resource Strain: Not on file  Food Insecurity: Not on file  Transportation Needs: Not on file  Physical Activity: Not on file  Stress: Not on file  Social Connections: Not on file  Intimate Partner Violence: Not on file    FAMILY HISTORY: Family History  Problem Relation Age of Onset   Cancer Neg Hx     ALLERGIES:  is allergic to sildenafil.  MEDICATIONS:  Current Outpatient Medications  Medication Sig Dispense Refill   acyclovir (ZOVIRAX) 400 MG tablet TAKE 1 TABLET(400 MG) BY MOUTH DAILY 30 tablet 3   ammonium lactate (LAC-HYDRIN) 12 % lotion Apply 1 application topically 2 (two) times daily as needed for dry skin.     atorvastatin (LIPITOR) 20 MG tablet Take 10 mg by mouth at bedtime.     Cholecalciferol (VITAMIN D3 PO) Take 1 tablet by mouth every morning.  diclofenac Sodium (VOLTAREN) 1 % GEL Apply 2 g topically 4 (four) times daily as needed (pain).     feeding supplement (ENSURE ENLIVE / ENSURE PLUS) LIQD Take 237 mLs by mouth 2 (two) times daily between meals. 237 mL 12   flecainide (TAMBOCOR) 100 MG tablet Take 100 mg by mouth every 12 (twelve) hours.     fluticasone (FLONASE) 50 MCG/ACT nasal spray Place 1 spray into both nostrils daily as needed for allergies or rhinitis.     hydroxypropyl methylcellulose / hypromellose (ISOPTO TEARS / GONIOVISC) 2.5 % ophthalmic solution Place 2 drops into both eyes 2 (two) times daily as needed for dry eyes.      LORazepam (ATIVAN) 0.5 MG tablet Take 1 tablet (0.5 mg total) by mouth every 6 (six) hours as needed (Nausea or vomiting). (Patient not taking: Reported on 06/03/2022) 30 tablet 0   metoprolol tartrate (LOPRESSOR) 25 MG tablet Take 12.5 mg by mouth 2 (two) times daily. Take with flecainide     mirtazapine (REMERON) 30 MG tablet Take 30 mg by mouth at bedtime.     omeprazole (PRILOSEC) 20 MG capsule Take 20 mg by mouth daily before breakfast.     ondansetron (ZOFRAN) 8 MG tablet Take 1 tablet (8 mg total) by mouth 2 (two) times daily as needed for refractory nausea / vomiting. Start on day 2 after bendamustine chemo. 30 tablet 1   potassium chloride SA (KLOR-CON M) 20 MEQ tablet Take 1 tablet (20 mEq total) by mouth 2 (two) times daily. 60 tablet 0   prazosin (MINIPRESS) 2 MG capsule Take 6 mg by mouth at bedtime.     prochlorperazine (COMPAZINE) 10 MG tablet Take 1 tablet (10 mg total) by mouth every 6 (six) hours as needed (Nausea or vomiting). 30 tablet 1   terbinafine (LAMISIL) 1 % cream Apply 1 application topically 2 (two) times daily as needed (athlete's foot).     torsemide (DEMADEX) 10 MG tablet Take 20 mg by mouth daily.     triamcinolone cream (KENALOG) 0.1 % Apply 1 application topically 2 (two) times daily as needed (rash).     No current facility-administered medications for this visit.    REVIEW OF SYSTEMS:   10 Point review of Systems was done is negative except as noted above.  PHYSICAL EXAMINATION: .BP 106/63   Pulse (!) 57   Temp (!) 97.5 F (36.4 C)   Resp 20   Wt 200 lb (90.7 kg)   SpO2 100%   BMI 24.34 kg/m  NAD GENERAL:alert, in no acute distress and comfortable SKIN: no acute rashes, no significant lesions EYES: conjunctiva are pink and non-injected, sclera anicteric OROPHARYNX: MMM, no exudates, no oropharyngeal erythema or ulceration NECK: supple, no JVD LYMPH:  no palpable lymphadenopathy in the cervical, axillary or inguinal regions LUNGS: clear to  auscultation b/l with normal respiratory effort HEART: regular rate & rhythm ABDOMEN:  normoactive bowel sounds , non tender, not distended. Extremity: no pedal edema PSYCH: alert & oriented x 3 with fluent speech NEURO: no focal motor/sensory deficits   LABORATORY DATA:  I have reviewed the data as listed  .    Latest Ref Rng & Units 06/03/2022    1:09 PM 05/25/2022    2:30 PM 04/25/2022   10:05 AM  CBC  WBC 4.0 - 10.5 K/uL 2.3  2.2  2.5    2.5   Hemoglobin 13.0 - 17.0 g/dL 11.3  10.3  10.4    10.4   Hematocrit 39.0 -  52.0 % 38.4  31.4  32.4    32.2   Platelets 150 - 400 K/uL 149  151  162    169   ANC 700     Latest Ref Rng & Units 06/03/2022    1:09 PM 05/25/2022    2:30 PM 04/25/2022   10:05 AM  CMP  Glucose 70 - 99 mg/dL 89  90  86   BUN 8 - 23 mg/dL '27  20  29   '$ Creatinine 0.61 - 1.24 mg/dL 1.05  1.03  1.23   Sodium 135 - 145 mmol/L 138  143  143   Potassium 3.5 - 5.1 mmol/L 4.0  3.1  3.2   Chloride 98 - 111 mmol/L 105  109  105   CO2 22 - 32 mmol/L 25  28  32   Calcium 8.9 - 10.3 mg/dL 9.5  9.6  9.8   Total Protein 6.5 - 8.1 g/dL  7.4  7.5   Total Bilirubin 0.3 - 1.2 mg/dL  0.4  0.3   Alkaline Phos 38 - 126 U/L  62  60   AST 15 - 41 U/L  17  17   ALT 0 - 44 U/L  9  10   . Lab Results  Component Value Date   LDH 151 05/25/2022    . Uric acid 4.9 Surgical Pathology  CASE: [id number]  PATIENT: Omir Meacham  Flow Pathology Report  Clinical history: Mantle Cell Lymphoma of lymph nodes of multiple  regions  DIAGNOSIS:  -Monoclonal B-cell population identified  -See comment   COMMENT:   The findings are consistent with involvement by previously known mantle  cell lymphoma.    RADIOGRAPHIC STUDIES: I have personally reviewed the radiological images as listed and agreed with the findings in the report.  CT Head Wo Contrast  Result Date: 06/03/2022 CLINICAL DATA:  Head trauma EXAM: CT HEAD WITHOUT CONTRAST TECHNIQUE: Contiguous axial images were  obtained from the base of the skull through the vertex without intravenous contrast. RADIATION DOSE REDUCTION: This exam was performed according to the departmental dose-optimization program which includes automated exposure control, adjustment of the mA and/or kV according to patient size and/or use of iterative reconstruction technique. COMPARISON:  None Available. FINDINGS: Brain: No evidence of acute infarction, acute hemorrhage, hydrocephalus, or mass lesion/mass effect. There is a very small, nonacute appearing low-attenuation right hemispheric subdural collection measuring no greater than 0.3 cm (series 4, image 44). Vascular: No hyperdense vessel or unexpected calcification. Skull: Normal. Negative for fracture or focal lesion. Sinuses/Orbits: No acute finding. Other: None. IMPRESSION: 1. No acute intracranial pathology. 2. 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 Electronically Signed   By: Delanna Ahmadi M.D.   On: 06/03/2022 13:47   NM PET Image Restag (PS) Skull Base To Thigh  Result Date: 05/26/2022 CLINICAL DATA:  Subsequent treatment strategy for mantle cell lymphoma. History of prostate cancer also. EXAM: NUCLEAR MEDICINE PET SKULL BASE TO THIGH TECHNIQUE: 10.2 mCi F-18 FDG was injected intravenously. Full-ring PET imaging was performed from the skull base to thigh after the radiotracer. CT data was obtained and used for attenuation correction and anatomic localization. Fasting blood glucose: 91 mg/dl COMPARISON:  Prior head CTs 12/28/2021 and 03/13/2022 FINDINGS: Mediastinal blood pool activity: SUV max 1.52 Liver activity: SUV max 2.92 NECK: No hypermetabolic lymph nodes in the neck. Incidental CT findings: none CHEST: No residual  measurable or hypermetabolic supraclavicular or axillary lymph nodes. No enlarged or hypermetabolic mediastinal or hilar lymph nodes.  No worrisome pulmonary lesions pulmonary nodules. Incidental CT findings: Stable minimal scattered aortic and coronary artery calcifications. ABDOMEN/PELVIS: No enlarged or hypermetabolic abdominal or pelvic lymph nodes to suggest residual lymphoma. Small scattered lymph nodes are noted. Left medial external iliac node measures 10 mm on image number 154/4 and previously measured 14 mm. No FDG uptake. No inguinal adenopathy. Complete resolution of splenomegaly and residual hypermetabolism. Moderate hypermetabolism in the anorectal junction region could be inflammatory or hemorrhoids. SUV max is 8.30. This is similar to the prior PET-CT. Recommend clinical correlation. Incidental CT findings: Scattered vascular calcifications are stable. Brachytherapy seeds are noted in the prostate gland. SKELETON: No findings suspicious for osseous lymphoma or metastatic prostate cancer. Incidental CT findings: none IMPRESSION: 1. Complete metabolic response to treatment. No residual lymphadenopathy or hypermetabolism ([place] 1). 2. Normal size spleen without hypermetabolism. 3. Persistent moderate hypermetabolism in the anorectal region could be inflammatory or due to hemorrhoids. Recommend clinical correlation. Electronically Signed   By: Marijo Sanes M.D.   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 PET CT scan 12/29/2021  1. Enlarged hypermetabolic cervical, bilateral axillary, retroperitoneal, pelvic, and inguinal lymph nodes. [place] 4. 2.  Subcentimeter mediastinal lymph nodes with uptake similar to mediastinal blood pool. 3. Focal hypermetabolic uptake seen at the head of the right clavicle, concerning for osseous involvement. 4. Splenomegaly. 5. Increased tonsillar soft tissue, right greater than left, concerning for lymphomatous involvement.  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  3) moderate to severe protein calorie malnutrition-much improved patient is gaining weight and has been eating much better. . Wt Readings from Last 3 Encounters:  06/03/22 195 lb (88.5 kg)  05/31/22 200 lb (90.7 kg)  04/25/22 197 lb 12.8 oz (89.7 kg)     4) acute renal injury-likely due poor p.o. intake.  Due to spontaneous tumor lysis.  Also could be due to his hematuria and some element of urinary obstruction.  Now resolved  5) massive splenomegaly due to mantle cell lymphoma.  Now resolved on repeat imaging-  6) status post tumor lysis syndrome  PLAN Patient has now completed 5 cycles of Bendamustine and 1 cycle of Rituxan alone. PET CT scan done on 05/25/2022 shows complete metabolic response of the patient's mantle cell lymphoma. -Labs done today were reviewed in detail with the patient. -We discussed and patient is agreeable to proceed with maintenance Rituxan therapy for at least 2 to 3 years if tolerated.- -No other acute new symptoms at this time.  No respiratory treatment related toxicities at this time. Please schedule to start maintenance Rituxan in 1 month with labs and MD visit Please schedule for 3 doses of maintenance Rituxan every 2 months starting    Follow-up Please schedule to start maintenance Rituxan in 1 month with labs and MD visit Please schedule for 3 doses of maintenance Rituxan every 2 months starting   T the total time spent in the appointment  was 32 minutes*.  All of the patient's  questions were answered with apparent satisfaction. The patient knows to call the clinic with any problems, questions or concerns.   Sullivan Lone MD MS AAHIVMS [facility] Hematology/Oncology Physician [facility]  .*Total Encounter Time as defined by the Centers for Medicare and Medicaid Services includes, in addition to the face-to-face time of a patient visit (documented in the note above) non-face-to-face time: obtaining and reviewing outside history, ordering and reviewing medications, tests or procedures, care coordination (communications with other health care professionals or caregivers) and documentation in the medical record.

## 2022-06-12 ENCOUNTER — Encounter: Payer: Self-pay | Admitting: Hematology

## 2022-08-16 ENCOUNTER — Other Ambulatory Visit: Payer: Self-pay | Admitting: Family Medicine

## 2022-08-16 ENCOUNTER — Ambulatory Visit
Admission: RE | Admit: 2022-08-16 | Discharge: 2022-08-16 | Disposition: A | Discharge status: Home / Self Care | Payer: Medicare Other | Source: Ambulatory Visit | Attending: Family Medicine | Admitting: Family Medicine

## 2022-08-16 DIAGNOSIS — R3129 Other microscopic hematuria: Secondary | ICD-10-CM

## 2022-08-16 DIAGNOSIS — M25562 Pain in left knee: Secondary | ICD-10-CM

## 2022-09-04 ENCOUNTER — Other Ambulatory Visit: Payer: Self-pay | Admitting: Hematology

## 2022-09-04 DIAGNOSIS — Z7189 Other specified counseling: Secondary | ICD-10-CM

## 2022-09-04 DIAGNOSIS — C8318 Mantle cell lymphoma, lymph nodes of multiple sites: Secondary | ICD-10-CM

## 2022-09-08 ENCOUNTER — Other Ambulatory Visit: Payer: Self-pay

## 2022-09-08 ENCOUNTER — Emergency Department (HOSPITAL_COMMUNITY)
Admission: EM | Admit: 2022-09-08 | Discharge: 2022-09-08 | Disposition: A | Discharge status: Home / Self Care | Payer: No Typology Code available for payment source | Attending: Emergency Medicine | Admitting: Emergency Medicine

## 2022-09-08 ENCOUNTER — Encounter (HOSPITAL_COMMUNITY): Payer: Self-pay

## 2022-09-08 ENCOUNTER — Emergency Department (HOSPITAL_COMMUNITY): Payer: No Typology Code available for payment source

## 2022-09-08 DIAGNOSIS — Z79899 Other long term (current) drug therapy: Secondary | ICD-10-CM | POA: Diagnosis not present

## 2022-09-08 DIAGNOSIS — R55 Syncope and collapse: Secondary | ICD-10-CM | POA: Diagnosis present

## 2022-09-08 DIAGNOSIS — N1832 Chronic kidney disease, stage 3b: Secondary | ICD-10-CM | POA: Diagnosis not present

## 2022-09-08 DIAGNOSIS — I129 Hypertensive chronic kidney disease with stage 1 through stage 4 chronic kidney disease, or unspecified chronic kidney disease: Secondary | ICD-10-CM | POA: Insufficient documentation

## 2022-09-08 DIAGNOSIS — Z8546 Personal history of malignant neoplasm of prostate: Secondary | ICD-10-CM | POA: Diagnosis not present

## 2022-09-08 LAB — COMPREHENSIVE METABOLIC PANEL
ALT: 13 U/L (ref 0–44)
AST: 19 U/L (ref 15–41)
Albumin: 3.9 g/dL (ref 3.5–5.0)
Alkaline Phosphatase: 71 U/L (ref 38–126)
Anion gap: 7 (ref 5–15)
BUN: 27 mg/dL — ABNORMAL HIGH (ref 8–23)
CO2: 25 mmol/L (ref 22–32)
Calcium: 9.7 mg/dL (ref 8.9–10.3)
Chloride: 107 mmol/L (ref 98–111)
Creatinine, Ser: 0.94 mg/dL (ref 0.61–1.24)
GFR, Estimated: >60 mL/min (ref >60)
Glucose, Bld: 106 mg/dL — ABNORMAL HIGH (ref 70–99)
Potassium: 3.7 mmol/L (ref 3.5–5.1)
Sodium: 139 mmol/L (ref 135–145)
Total Bilirubin: 0.5 mg/dL (ref 0.3–1.2)
Total Protein: 8.2 g/dL — ABNORMAL HIGH (ref 6.5–8.1)

## 2022-09-08 LAB — URINALYSIS, ROUTINE W REFLEX MICROSCOPIC
Bilirubin Urine: NEGATIVE
Glucose, UA: NEGATIVE mg/dL
Hgb urine dipstick: NEGATIVE
Ketones, ur: NEGATIVE mg/dL
Leukocytes,Ua: NEGATIVE
Nitrite: NEGATIVE
Protein, ur: NEGATIVE mg/dL
Specific Gravity, Urine: 1.018 (ref 1.005–1.030)
pH: 5 (ref 5.0–8.0)

## 2022-09-08 LAB — CBC WITH DIFFERENTIAL/PLATELET
Abs Immature Granulocytes: 0.14 10*3/uL — ABNORMAL HIGH (ref 0.00–0.07)
Basophils Absolute: 0 10*3/uL (ref 0.0–0.1)
Basophils Relative: 1 %
Eosinophils Absolute: 0 10*3/uL (ref 0.0–0.5)
Eosinophils Relative: 1 %
HCT: 34.3 % — ABNORMAL LOW (ref 39.0–52.0)
Hemoglobin: 10.7 g/dL — ABNORMAL LOW (ref 13.0–17.0)
Immature Granulocytes: 4 %
Lymphocytes Relative: 15 %
Lymphs Abs: 0.5 10*3/uL — ABNORMAL LOW (ref 0.7–4.0)
MCH: 26.9 pg (ref 26.0–34.0)
MCHC: 31.2 g/dL (ref 30.0–36.0)
MCV: 86.2 fL (ref 80.0–100.0)
Monocytes Absolute: 0.3 10*3/uL (ref 0.1–1.0)
Monocytes Relative: 8 %
Neutro Abs: 2.4 10*3/uL (ref 1.7–7.7)
Neutrophils Relative %: 71 %
Platelets: 182 10*3/uL (ref 150–400)
RBC: 3.98 MIL/uL — ABNORMAL LOW (ref 4.22–5.81)
RDW: 14.1 % (ref 11.5–15.5)
WBC: 3.3 10*3/uL — ABNORMAL LOW (ref 4.0–10.5)
nRBC: 0 % (ref 0.0–0.2)

## 2022-09-08 LAB — TROPONIN I (HIGH SENSITIVITY): Troponin I (High Sensitivity): 6 ng/L (ref <18)

## 2022-09-08 MED ORDER — SODIUM CHLORIDE 0.9 % IV BOLUS
1000.0000 mL | Freq: Once | INTRAVENOUS | Status: AC
  Administered 2022-09-08: 1000 mL via INTRAVENOUS

## 2022-09-08 NOTE — ED Triage Notes (Signed)
Pt states was feeling lightheaded and had syncopal episode this morning. Hit head on carpet when he fell. States is still feeling lightheaded in triage. Covid+ 10/26

## 2022-09-08 NOTE — ED Provider Notes (Addendum)
[facility] DEPT Provider Note   CSN: [id number] Arrival date & time: 09/08/22  [id number]     History  Chief Complaint  Patient presents with   Loss of Consciousness    Jerry Tran is a 76 y.o. male.   Loss of Consciousness Patient presents after near syncopal episode.  Felt dizzy this morning.  States he was feeling a little lightheaded.  States he urinated and was setting the urinal down and fell and hit his head.  On carpet.  No loss of consciousness.  Still feels little bit weak.  States he was diagnosed with COVID about 2 weeks ago.  Instead a little bit of cough.  Somewhat decreased oral intake.  No chest pain.  No problem breathing.  Reportedly history of atrial fibrillation.  Not on anticoagulation.  No abdominal pain.  Does urinate frequently but states that is due to his prostate.    Past Medical History:  Diagnosis Date   Arthritis    Chronic kidney disease, stage 3b ([place]) 12/09/2021   Dyslipidemia    GERD (gastroesophageal reflux disease)    Hypertension    PAF (paroxysmal atrial fibrillation) (HCC)    Prostate cancer ([place]) 03/2021   s/p radiation therapy   PTSD (post-traumatic stress disorder)     Home Medications Prior to Admission medications   Medication Sig Start Date End Date Taking? Authorizing Provider  acyclovir (ZOVIRAX) 400 MG tablet TAKE 1 TABLET(400 MG) BY MOUTH DAILY 09/04/22   Brunetta Genera, MD  ammonium lactate (LAC-HYDRIN) 12 % lotion Apply 1 application topically 2 (two) times daily as needed for dry skin.    [provider]  atorvastatin (LIPITOR) 20 MG tablet Take 10 mg by mouth at bedtime.    [provider]  Cholecalciferol (VITAMIN D3 PO) Take 1 tablet by mouth every morning.    [provider]  diclofenac Sodium (VOLTAREN) 1 % GEL Apply 2 g topically 4 (four) times daily as needed (pain).    [provider]  feeding supplement (ENSURE ENLIVE / ENSURE PLUS) LIQD Take 237  mLs by mouth 2 (two) times daily between meals. 12/11/21   Regalado, Belkys A, MD  flecainide (TAMBOCOR) 100 MG tablet Take 100 mg by mouth every 12 (twelve) hours.    [provider]  fluticasone (FLONASE) 50 MCG/ACT nasal spray Place 1 spray into both nostrils daily as needed for allergies or rhinitis.    [provider]  hydroxypropyl methylcellulose / hypromellose (ISOPTO TEARS / GONIOVISC) 2.5 % ophthalmic solution Place 2 drops into both eyes 2 (two) times daily as needed for dry eyes.    [provider]  LORazepam (ATIVAN) 0.5 MG tablet Take 1 tablet (0.5 mg total) by mouth every 6 (six) hours as needed (Nausea or vomiting). Patient not taking: Reported on 06/03/2022 12/16/21   Brunetta Genera, MD  metoprolol tartrate (LOPRESSOR) 25 MG tablet Take 12.5 mg by mouth 2 (two) times daily. Take with flecainide    [provider]  mirtazapine (REMERON) 30 MG tablet Take 30 mg by mouth at bedtime. 01/31/22   [provider]  omeprazole (PRILOSEC) 20 MG capsule Take 20 mg by mouth daily before breakfast.    [provider]  ondansetron (ZOFRAN) 8 MG tablet Take 1 tablet (8 mg total) by mouth 2 (two) times daily as needed for refractory nausea / vomiting. Start on day 2 after bendamustine chemo. 12/16/21   Brunetta Genera, MD  potassium chloride SA (KLOR-CON M)  20 MEQ tablet Take 1 tablet (20 mEq total) by mouth 2 (two) times daily. 04/25/22   Brunetta Genera, MD  prazosin (MINIPRESS) 2 MG capsule Take 6 mg by mouth at bedtime.    [provider]  prochlorperazine (COMPAZINE) 10 MG tablet Take 1 tablet (10 mg total) by mouth every 6 (six) hours as needed (Nausea or vomiting). 12/16/21   Brunetta Genera, MD  terbinafine (LAMISIL) 1 % cream Apply 1 application topically 2 (two) times daily as needed (athlete's foot).    [provider]  torsemide (DEMADEX) 10 MG tablet Take 20 mg by mouth daily.    [provider]   triamcinolone cream (KENALOG) 0.1 % Apply 1 application topically 2 (two) times daily as needed (rash).    [provider]      Allergies    Sildenafil    Review of Systems   Review of Systems  Cardiovascular:  Positive for syncope.    Physical Exam Updated Vital Signs BP 107/80 (BP Location: Left Arm)   Pulse 72   Temp 97.8 F (36.6 C)   Resp 14   Ht '6\' 4"'$  (1.93 m)   Wt 88.5 kg   SpO2 100%   BMI 23.74 kg/m  Physical Exam Vitals and nursing note reviewed.  HENT:     Head: Normocephalic.  Eyes:     Pupils: Pupils are equal, round, and reactive to light.  Cardiovascular:     Rate and Rhythm: Regular rhythm.  Pulmonary:     Breath sounds: No wheezing.  Abdominal:     Tenderness: There is no abdominal tenderness.  Musculoskeletal:        General: No tenderness.  Skin:    General: Skin is warm.     Capillary Refill: Capillary refill takes less than 2 seconds.  Neurological:     Mental Status: He is alert. Mental status is at baseline.     ED Results / Procedures / Treatments   Labs (all labs ordered are listed, but only abnormal results are displayed) Labs Reviewed  COMPREHENSIVE METABOLIC PANEL - Abnormal; Notable for the following components:      Result Value   Glucose, Bld 106 (*)    BUN 27 (*)    Total Protein 8.2 (*)    All other components within normal limits  CBC WITH DIFFERENTIAL/PLATELET - Abnormal; Notable for the following components:   WBC 3.3 (*)    RBC 3.98 (*)    Hemoglobin 10.7 (*)    HCT 34.3 (*)    Lymphs Abs 0.5 (*)    Abs Immature Granulocytes 0.14 (*)    All other components within normal limits  URINALYSIS, ROUTINE W REFLEX MICROSCOPIC  TROPONIN I (HIGH SENSITIVITY)    EKG None  Radiology DG Chest Portable 1 View  Result Date: 09/08/2022 CLINICAL DATA:  Syncope. EXAM: PORTABLE CHEST 1 VIEW COMPARISON:  Chest x-ray dated December 14, 2021. FINDINGS: The heart size and mediastinal contours are within normal limits.  Minimal linear scarring at the left lung base. No focal consolidation, pleural effusion, or pneumothorax. The visualized skeletal structures are unremarkable. IMPRESSION: No active disease. Electronically Signed   By: Titus Dubin M.D.   On: 09/08/2022 10:37    Procedures Procedures    Medications Ordered in ED Medications  sodium chloride 0.9 % bolus 1,000 mL (has no administration in time range)    ED Course/ Medical Decision Making/ A&P  Medical Decision Making Amount and/or Complexity of Data Reviewed Labs: ordered. Radiology: ordered.   Patient with near syncope.  Fell and hit head after lightheadedness.  Not on blood thinners.  No loss conscious.  Witnessed fall.  Still feels a little bit weak.  Has had COVID recently.  Potentially could have some dehydration versus infection versus anemia.  Potentially also vasovagal.  We will get basic blood work chest x-ray and EKG.  We will also get orthostatic vital signs.Lab work appears to be near baseline.  BUN mildly elevated with  no elevation of creatinine. CBC shows a mild anemia of chronic leukopenia.  Orthostatic vital signs had not been done yet.      We will give fluid bolus.  I think overall likely is somewhat weakened or potentially dehydrated from the [place] he had recently.  Hopefully should be able to go home.  He can go to start Care turned over to Dr Zenia Resides  Blood pressure did decrease somewhat with orthostasis.  We will give fluid bolus now.          Final Clinical Impression(s) / ED Diagnoses Final diagnoses:  Near syncope    Rx / DC Orders ED Discharge Orders     None         Davonna Belling, MD 09/08/22 1609    Davonna Belling, MD 09/08/22 1626

## 2022-09-08 NOTE — ED Provider Notes (Signed)
Patient is found by Dr. Alvino Chapel pending results of urinalysis and received IV fluids.  Patient examined and states that he feels better at this time.  Has no focal weakness.  Will be discharged home.   Lacretia Leigh, MD 09/08/22 1850

## 2022-09-08 NOTE — ED Notes (Signed)
DC pending fluid bolus completion

## 2022-09-11 ENCOUNTER — Inpatient Hospital Stay: Payer: Non-veteran care

## 2022-09-11 ENCOUNTER — Inpatient Hospital Stay: Payer: Non-veteran care | Admitting: Hematology

## 2022-09-11 ENCOUNTER — Other Ambulatory Visit: Payer: Self-pay | Admitting: Hematology

## 2022-09-11 DIAGNOSIS — C8318 Mantle cell lymphoma, lymph nodes of multiple sites: Secondary | ICD-10-CM

## 2022-09-11 DIAGNOSIS — Z7189 Other specified counseling: Secondary | ICD-10-CM

## 2022-09-12 ENCOUNTER — Inpatient Hospital Stay: Payer: Non-veteran care

## 2022-09-12 ENCOUNTER — Other Ambulatory Visit: Payer: Self-pay

## 2022-09-16 ENCOUNTER — Emergency Department (HOSPITAL_COMMUNITY)
Admission: EM | Admit: 2022-09-16 | Discharge: 2022-09-16 | Disposition: A | Discharge status: Home / Self Care | Payer: No Typology Code available for payment source | Attending: Emergency Medicine | Admitting: Emergency Medicine

## 2022-09-16 ENCOUNTER — Other Ambulatory Visit: Payer: Self-pay

## 2022-09-16 ENCOUNTER — Emergency Department (HOSPITAL_COMMUNITY): Payer: No Typology Code available for payment source

## 2022-09-16 DIAGNOSIS — Z8546 Personal history of malignant neoplasm of prostate: Secondary | ICD-10-CM | POA: Insufficient documentation

## 2022-09-16 DIAGNOSIS — R0602 Shortness of breath: Secondary | ICD-10-CM | POA: Insufficient documentation

## 2022-09-16 DIAGNOSIS — R059 Cough, unspecified: Secondary | ICD-10-CM | POA: Diagnosis present

## 2022-09-16 DIAGNOSIS — K122 Cellulitis and abscess of mouth: Secondary | ICD-10-CM | POA: Insufficient documentation

## 2022-09-16 MED ORDER — DEXAMETHASONE SODIUM PHOSPHATE 10 MG/ML IJ SOLN
10.0000 mg | Freq: Once | INTRAMUSCULAR | Status: AC
  Administered 2022-09-16: 10 mg via INTRAVENOUS
  Filled 2022-09-16: qty 1

## 2022-09-16 MED ORDER — AMOXICILLIN-POT CLAVULANATE 875-125 MG PO TABS
1.0000 | ORAL_TABLET | Freq: Once | ORAL | Status: AC
  Administered 2022-09-16: 1 via ORAL
  Filled 2022-09-16: qty 1

## 2022-09-16 MED ORDER — AMOXICILLIN-POT CLAVULANATE 875-125 MG PO TABS: 1.0000 | ORAL_TABLET | Freq: Two times a day (BID) | ORAL | 0 refills | Status: DC

## 2022-09-16 NOTE — ED Provider Notes (Signed)
[facility] [place] DEPT Provider Note   CSN: [id number] Arrival date & time: 09/16/22  1219     History  Chief Complaint  Patient presents with   Shortness of Breath   Cough    Jerry Tran is a 76 y.o. male.  HPI 76 year old male with a history of prostate cancer currently being treated presents with swelling to the back of his oropharynx and change in voice.  No neck pain.  Feeling is harder to breathe through it but overall he is breathing fine.  No fevers or cough.  He woke up this way this morning.  Might of had a little bit of cough last night but otherwise no significant cough.  Feels like the back of his throat is swollen.  Home Medications Prior to Admission medications   Medication Sig Start Date End Date Taking? Authorizing Provider  amoxicillin-clavulanate (AUGMENTIN) 875-125 MG tablet Take 1 tablet by mouth every 12 (twelve) hours. 09/16/22  Yes Sherwood Gambler, MD  acyclovir (ZOVIRAX) 400 MG tablet TAKE 1 TABLET(400 MG) BY MOUTH DAILY 09/04/22   Brunetta Genera, MD  ammonium lactate (LAC-HYDRIN) 12 % lotion Apply 1 application topically 2 (two) times daily as needed for dry skin.    [provider]  atorvastatin (LIPITOR) 20 MG tablet Take 10 mg by mouth at bedtime.    [provider]  Cholecalciferol (VITAMIN D3 PO) Take 1 tablet by mouth every morning.    [provider]  diclofenac Sodium (VOLTAREN) 1 % GEL Apply 2 g topically 4 (four) times daily as needed (pain).    [provider]  feeding supplement (ENSURE ENLIVE / ENSURE PLUS) LIQD Take 237 mLs by mouth 2 (two) times daily between meals. 12/11/21   Regalado, Belkys A, MD  flecainide (TAMBOCOR) 100 MG tablet Take 100 mg by mouth every 12 (twelve) hours.    [provider]  fluticasone (FLONASE) 50 MCG/ACT nasal spray Place 1 spray into both nostrils daily as needed for allergies or rhinitis.    [provider]  hydroxypropyl  methylcellulose / hypromellose (ISOPTO TEARS / GONIOVISC) 2.5 % ophthalmic solution Place 2 drops into both eyes 2 (two) times daily as needed for dry eyes.    [provider]  LORazepam (ATIVAN) 0.5 MG tablet Take 1 tablet (0.5 mg total) by mouth every 6 (six) hours as needed (Nausea or vomiting). Patient not taking: Reported on 06/03/2022 12/16/21   Brunetta Genera, MD  metoprolol tartrate (LOPRESSOR) 25 MG tablet Take 12.5 mg by mouth 2 (two) times daily. Take with flecainide    [provider]  mirtazapine (REMERON) 30 MG tablet Take 30 mg by mouth at bedtime. 01/31/22   [provider]  omeprazole (PRILOSEC) 20 MG capsule Take 20 mg by mouth daily before breakfast.    [provider]  ondansetron (ZOFRAN) 8 MG tablet Take 1 tablet (8 mg total) by mouth 2 (two) times daily as needed for refractory nausea / vomiting. Start on day 2 after bendamustine chemo. 12/16/21   Brunetta Genera, MD  potassium chloride SA (KLOR-CON M) 20 MEQ tablet Take 1 tablet (20 mEq total) by mouth 2 (two) times daily. 04/25/22   Brunetta Genera, MD  prazosin (MINIPRESS) 2 MG capsule Take 6 mg by mouth at bedtime.    [provider]  prochlorperazine (COMPAZINE) 10 MG tablet Take 1 tablet (10 mg total) by mouth every 6 (six) hours as needed (Nausea or vomiting). 12/16/21   Sullivan Lone  Vivien Rossetti, MD  terbinafine (LAMISIL) 1 % cream Apply 1 application topically 2 (two) times daily as needed (athlete's foot).    [provider]  torsemide (DEMADEX) 10 MG tablet Take 20 mg by mouth daily.    [provider]  triamcinolone cream (KENALOG) 0.1 % Apply 1 application topically 2 (two) times daily as needed (rash).    [provider]      Allergies    Sildenafil    Review of Systems   Review of Systems  Constitutional:  Negative for fever.  HENT:  Positive for voice change. Negative for sore throat and trouble swallowing.   Respiratory:  Negative  for cough and shortness of breath.   Musculoskeletal:  Negative for neck pain.    Physical Exam Updated Vital Signs BP 137/77   Pulse (!) 59   Temp (!) 97.4 F (36.3 C) (Oral)   Resp 19   SpO2 99%  Physical Exam Vitals and nursing note reviewed.  Constitutional:      Appearance: He is well-developed.  HENT:     Head: Normocephalic and atraumatic.     Mouth/Throat:     Pharynx: Uvula midline. Uvula swelling present. No oropharyngeal exudate or posterior oropharyngeal erythema.     Comments: No lip or tongue swelling. Does have a mildly boggy and swollen uvula. It is midline. Cardiovascular:     Rate and Rhythm: Normal rate and regular rhythm.     Heart sounds: Normal heart sounds.  Pulmonary:     Effort: Pulmonary effort is normal.     Breath sounds: Normal breath sounds. No stridor. No decreased breath sounds, rhonchi or rales.  Skin:    General: Skin is warm and dry.  Neurological:     Mental Status: He is alert.     ED Results / Procedures / Treatments   Labs (all labs ordered are listed, but only abnormal results are displayed) Labs Reviewed - No data to display  EKG EKG Interpretation  Date/Time:  Saturday September 16 2022 12:29:42 EST Ventricular Rate:  81 PR Interval:  198 QRS Duration: 82 QT Interval:  355 QTC Calculation: 412 R Axis:   67 Text Interpretation: Sinus rhythm Low voltage, extremity leads no acute ST/T changes Confirmed by Sherwood Gambler [phone]) on 09/16/2022 1:30:36 PM  Radiology DG Chest 2 View  Result Date: 09/16/2022 CLINICAL DATA:  Shortness of breath. EXAM: CHEST - 2 VIEW COMPARISON:  09/08/2022 and prior studies FINDINGS: The cardiomediastinal silhouette is unremarkable. Mild peribronchial thickening is unchanged. There is no evidence of focal airspace disease, pulmonary edema, suspicious pulmonary nodule/mass, pleural effusion, or pneumothorax. No acute bony abnormalities are identified. IMPRESSION: No active cardiopulmonary disease.  Electronically Signed   By: Margarette Canada M.D.   On: 09/16/2022 13:26    Procedures Procedures    Medications Ordered in ED Medications  dexamethasone (DECADRON) injection 10 mg (10 mg Intravenous Given 09/16/22 1304)  amoxicillin-clavulanate (AUGMENTIN) 875-125 MG per tablet 1 tablet (1 tablet Oral Given 09/16/22 1304)    ED Course/ Medical Decision Making/ A&P                           Medical Decision Making Amount and/or Complexity of Data Reviewed Radiology: ordered.  Risk Prescription drug management.   Presentation is consistent with uvulitis.  He is otherwise well-appearing and does not have any signs of impending airway compromise.  He was given IV Decadron and some Augmentin.  After being observed  in the ER he states that his symptoms have improved.  I doubt deep space neck infection.  He has no neck pain or sore throat.  Could be infectious, especially with his cancer history so we will give Augmentin but otherwise the most important treatment is probably the Decadron.  Given return precautions.  No lisinopril use or other ACE inhibitor use to suggest that this is ACE inhibitor induced angioedema.        Final Clinical Impression(s) / ED Diagnoses Final diagnoses:  Uvulitis    Rx / DC Orders ED Discharge Orders          Ordered    amoxicillin-clavulanate (AUGMENTIN) 875-125 MG tablet  Every 12 hours        09/16/22 1421              Sherwood Gambler, MD 09/16/22 1552

## 2022-09-16 NOTE — ED Triage Notes (Signed)
Pt presents with shob and difficulty swallowing this am, difficulty to swallow secretions. Pthad Covid 10/26.

## 2022-09-16 NOTE — Discharge Instructions (Addendum)
You were given a dose of steroids to help with the uvula swelling.  If at any point you feel like it is getting bigger, you have trouble breathing or swallowing, you develop neck pain or swelling, fever, or any other new/concerning symptoms then return to the ER for evaluation.

## 2022-10-03 ENCOUNTER — Inpatient Hospital Stay: Payer: Medicare Other

## 2022-10-03 ENCOUNTER — Other Ambulatory Visit: Payer: Self-pay

## 2022-10-03 ENCOUNTER — Emergency Department (HOSPITAL_COMMUNITY): Payer: No Typology Code available for payment source

## 2022-10-03 ENCOUNTER — Encounter (HOSPITAL_COMMUNITY): Payer: Self-pay

## 2022-10-03 ENCOUNTER — Inpatient Hospital Stay: Payer: Medicare Other | Admitting: Hematology

## 2022-10-03 ENCOUNTER — Emergency Department (HOSPITAL_COMMUNITY)
Admission: EM | Admit: 2022-10-03 | Discharge: 2022-10-03 | Disposition: A | Discharge status: Home / Self Care | Payer: No Typology Code available for payment source | Attending: Emergency Medicine | Admitting: Emergency Medicine

## 2022-10-03 DIAGNOSIS — R590 Localized enlarged lymph nodes: Secondary | ICD-10-CM | POA: Diagnosis not present

## 2022-10-03 DIAGNOSIS — Z20822 Contact with and (suspected) exposure to covid-19: Secondary | ICD-10-CM | POA: Insufficient documentation

## 2022-10-03 DIAGNOSIS — J029 Acute pharyngitis, unspecified: Secondary | ICD-10-CM | POA: Insufficient documentation

## 2022-10-03 DIAGNOSIS — R591 Generalized enlarged lymph nodes: Secondary | ICD-10-CM

## 2022-10-03 DIAGNOSIS — N189 Chronic kidney disease, unspecified: Secondary | ICD-10-CM | POA: Insufficient documentation

## 2022-10-03 DIAGNOSIS — R519 Headache, unspecified: Secondary | ICD-10-CM | POA: Insufficient documentation

## 2022-10-03 DIAGNOSIS — I129 Hypertensive chronic kidney disease with stage 1 through stage 4 chronic kidney disease, or unspecified chronic kidney disease: Secondary | ICD-10-CM | POA: Insufficient documentation

## 2022-10-03 DIAGNOSIS — R059 Cough, unspecified: Secondary | ICD-10-CM | POA: Diagnosis not present

## 2022-10-03 DIAGNOSIS — R0981 Nasal congestion: Secondary | ICD-10-CM | POA: Insufficient documentation

## 2022-10-03 DIAGNOSIS — Z79899 Other long term (current) drug therapy: Secondary | ICD-10-CM | POA: Diagnosis not present

## 2022-10-03 DIAGNOSIS — J3489 Other specified disorders of nose and nasal sinuses: Secondary | ICD-10-CM | POA: Diagnosis not present

## 2022-10-03 LAB — CBC WITH DIFFERENTIAL/PLATELET
Abs Immature Granulocytes: 0.02 10*3/uL (ref 0.00–0.07)
Basophils Absolute: 0 10*3/uL (ref 0.0–0.1)
Basophils Relative: 1 %
Eosinophils Absolute: 0.1 10*3/uL (ref 0.0–0.5)
Eosinophils Relative: 1 %
HCT: 29.7 % — ABNORMAL LOW (ref 39.0–52.0)
Hemoglobin: 9.5 g/dL — ABNORMAL LOW (ref 13.0–17.0)
Immature Granulocytes: 1 %
Lymphocytes Relative: 18 %
Lymphs Abs: 0.8 10*3/uL (ref 0.7–4.0)
MCH: 27.3 pg (ref 26.0–34.0)
MCHC: 32 g/dL (ref 30.0–36.0)
MCV: 85.3 fL (ref 80.0–100.0)
Monocytes Absolute: 0.5 10*3/uL (ref 0.1–1.0)
Monocytes Relative: 12 %
Neutro Abs: 2.9 10*3/uL (ref 1.7–7.7)
Neutrophils Relative %: 67 %
Platelets: 140 10*3/uL — ABNORMAL LOW (ref 150–400)
RBC: 3.48 MIL/uL — ABNORMAL LOW (ref 4.22–5.81)
RDW: 14.7 % (ref 11.5–15.5)
WBC: 4.3 10*3/uL (ref 4.0–10.5)
nRBC: 0 % (ref 0.0–0.2)

## 2022-10-03 LAB — COMPREHENSIVE METABOLIC PANEL
ALT: 13 U/L (ref 0–44)
AST: 18 U/L (ref 15–41)
Albumin: 3.3 g/dL — ABNORMAL LOW (ref 3.5–5.0)
Alkaline Phosphatase: 64 U/L (ref 38–126)
Anion gap: 8 (ref 5–15)
BUN: 21 mg/dL (ref 8–23)
CO2: 23 mmol/L (ref 22–32)
Calcium: 9.3 mg/dL (ref 8.9–10.3)
Chloride: 108 mmol/L (ref 98–111)
Creatinine, Ser: 1.04 mg/dL (ref 0.61–1.24)
GFR, Estimated: >60 mL/min (ref >60)
Glucose, Bld: 86 mg/dL (ref 70–99)
Potassium: 3.5 mmol/L (ref 3.5–5.1)
Sodium: 139 mmol/L (ref 135–145)
Total Bilirubin: 0.5 mg/dL (ref 0.3–1.2)
Total Protein: 7.2 g/dL (ref 6.5–8.1)

## 2022-10-03 LAB — RESP PANEL BY RT-PCR (FLU A&B, COVID) ARPGX2
Influenza A by PCR: NEGATIVE
Influenza B by PCR: NEGATIVE
SARS Coronavirus 2 by RT PCR: NEGATIVE

## 2022-10-03 LAB — GROUP A STREP BY PCR: Group A Strep by PCR: NOT DETECTED

## 2022-10-03 MED ORDER — IOHEXOL 350 MG/ML SOLN
75.0000 mL | Freq: Once | INTRAVENOUS | Status: AC | PRN
  Administered 2022-10-03: 75 mL via INTRAVENOUS

## 2022-10-03 MED ORDER — DEXAMETHASONE SODIUM PHOSPHATE 10 MG/ML IJ SOLN
8.0000 mg | Freq: Once | INTRAMUSCULAR | Status: AC
  Administered 2022-10-03: 8 mg via INTRAVENOUS
  Filled 2022-10-03: qty 1

## 2022-10-03 MED ORDER — PREDNISONE 20 MG PO TABS: 20.0000 mg | ORAL_TABLET | Freq: Every day | ORAL | 0 refills | Status: DC

## 2022-10-03 NOTE — ED Notes (Signed)
Patient verbalizes understanding of d/c instructions. Opportunities for questions and answers were provided. Pt d/c from ED and ambulated to lobby.  

## 2022-10-03 NOTE — ED Provider Notes (Signed)
[facility] EMERGENCY DEPARTMENT Provider Note   CSN: [id number] Arrival date & time: 10/03/22  3419    History  Chief Complaint  Patient presents with   Nasal Congestion   Sore Throat    Danish Ruffins is a 76 y.o. male history of mantle cell lymphoma, CKD, arthritis, GERD, hypertension, A-fib here for evaluation of sore throat, difficulty swallowing.  Noticed he has had a dry cough sore throat over the last 3 to 4 weeks.  Was seen in the emergency department diagnosed with uveitis given steroids and antibiotics.  Slightly helped however continue to have symptoms.  Also noted subjective chills.  Currently being followed by oncology for mantle cell lymphoma.  He denies any fever, nausea, vomiting, chest pain, shortness of breath, back pain, abdominal pain.  History of CHF.  Has had some mild diffuse aching.  No recent injury or trauma.  Family also noted he has had a mass to his right parietal region.  No redness or warmth.  No recent injury or trauma.  Had oncology appointment today however came here instead.   HPI     Home Medications Prior to Admission medications   Medication Sig Start Date End Date Taking? Authorizing Provider  predniSONE (DELTASONE) 20 MG tablet Take 1 tablet (20 mg total) by mouth daily for 5 days. 10/03/22 10/08/22 Yes Larico Dimock A, PA-C  acyclovir (ZOVIRAX) 400 MG tablet TAKE 1 TABLET(400 MG) BY MOUTH DAILY 09/04/22   Brunetta Genera, MD  ammonium lactate (LAC-HYDRIN) 12 % lotion Apply 1 application topically 2 (two) times daily as needed for dry skin.    [provider]  amoxicillin-clavulanate (AUGMENTIN) 875-125 MG tablet Take 1 tablet by mouth every 12 (twelve) hours. 09/16/22   Sherwood Gambler, MD  atorvastatin (LIPITOR) 20 MG tablet Take 10 mg by mouth at bedtime.    [provider]  Cholecalciferol (VITAMIN D3 PO) Take 1 tablet by mouth every morning.    [provider]  diclofenac Sodium (VOLTAREN) 1  % GEL Apply 2 g topically 4 (four) times daily as needed (pain).    [provider]  feeding supplement (ENSURE ENLIVE / ENSURE PLUS) LIQD Take 237 mLs by mouth 2 (two) times daily between meals. 12/11/21   Regalado, Belkys A, MD  flecainide (TAMBOCOR) 100 MG tablet Take 100 mg by mouth every 12 (twelve) hours.    [provider]  fluticasone (FLONASE) 50 MCG/ACT nasal spray Place 1 spray into both nostrils daily as needed for allergies or rhinitis.    [provider]  hydroxypropyl methylcellulose / hypromellose (ISOPTO TEARS / GONIOVISC) 2.5 % ophthalmic solution Place 2 drops into both eyes 2 (two) times daily as needed for dry eyes.    [provider]  LORazepam (ATIVAN) 0.5 MG tablet Take 1 tablet (0.5 mg total) by mouth every 6 (six) hours as needed (Nausea or vomiting). Patient not taking: Reported on 06/03/2022 12/16/21   Brunetta Genera, MD  metoprolol tartrate (LOPRESSOR) 25 MG tablet Take 12.5 mg by mouth 2 (two) times daily. Take with flecainide    [provider]  mirtazapine (REMERON) 30 MG tablet Take 30 mg by mouth at bedtime. 01/31/22   [provider]  omeprazole (PRILOSEC) 20 MG capsule Take 20 mg by mouth daily before breakfast.    [provider]  ondansetron (ZOFRAN) 8 MG tablet Take 1 tablet (8 mg total) by mouth 2 (two) times daily as needed for refractory nausea / vomiting. Start on  day 2 after bendamustine chemo. 12/16/21   Brunetta Genera, MD  potassium chloride SA (KLOR-CON M) 20 MEQ tablet Take 1 tablet (20 mEq total) by mouth 2 (two) times daily. 04/25/22   Brunetta Genera, MD  prazosin (MINIPRESS) 2 MG capsule Take 6 mg by mouth at bedtime.    [provider]  prochlorperazine (COMPAZINE) 10 MG tablet Take 1 tablet (10 mg total) by mouth every 6 (six) hours as needed (Nausea or vomiting). 12/16/21   Brunetta Genera, MD  terbinafine (LAMISIL) 1 % cream Apply 1 application topically 2 (two)  times daily as needed (athlete's foot).    [provider]  torsemide (DEMADEX) 10 MG tablet Take 20 mg by mouth daily.    [provider]  triamcinolone cream (KENALOG) 0.1 % Apply 1 application topically 2 (two) times daily as needed (rash).    [provider]      Allergies    Sildenafil    Review of Systems   Review of Systems  Constitutional: Negative.   HENT:  Positive for facial swelling, postnasal drip and sore throat. Negative for congestion, dental problem, drooling, ear discharge, ear pain, hearing loss, mouth sores, nosebleeds, sinus pressure, sinus pain, sneezing, tinnitus, trouble swallowing and voice change.   Respiratory:  Positive for cough. Negative for apnea, choking, chest tightness, shortness of breath, wheezing and stridor.   Cardiovascular: Negative.   Gastrointestinal: Negative.   Genitourinary: Negative.   Musculoskeletal:  Positive for myalgias.  Skin: Negative.   Neurological: Negative.   All other systems reviewed and are negative.   Physical Exam Updated Vital Signs BP 119/72   Pulse 67   Temp 98 F (36.7 C)   Resp 18   SpO2 100%  Physical Exam Vitals and nursing note reviewed.  Constitutional:      General: He is not in acute distress.    Appearance: He is well-developed. He is not ill-appearing, toxic-appearing or diaphoretic.  HENT:     Head: Normocephalic and atraumatic.     Jaw: There is normal jaw occlusion.     Comments: 1.5 cm firm mass to right parietal region.  No erythema, warmth.  No fluctuance or induration. Non Tender    Right Ear: Tympanic membrane and ear canal normal.     Left Ear: Tympanic membrane and ear canal normal.     Nose: Congestion and rhinorrhea present.     Mouth/Throat:     Tonsils: No tonsillar abscesses.     Comments: Uvula mildly edematous. No pooling of secretions Eyes:     Pupils: Pupils are equal, round, and reactive to light.  Neck:     Trachea: Trachea and phonation normal.      Comments: Non tender cervical lymphadenopathy R>L. No overlying erythema, warmth, induration or fluctuance. Cardiovascular:     Rate and Rhythm: Normal rate and regular rhythm.     Pulses: Normal pulses.     Heart sounds: Normal heart sounds.  Pulmonary:     Effort: Pulmonary effort is normal. No respiratory distress.     Breath sounds: Normal breath sounds and air entry. No wheezing, rhonchi or rales.     Comments: Clear bilaterally Chest:     Chest wall: No tenderness.     Comments: BIL small axillary lymphadenopathy  Abdominal:     General: Bowel sounds are normal. There is no distension.     Palpations: Abdomen is soft.     Tenderness: There is no abdominal tenderness.  Musculoskeletal:  General: Normal range of motion.     Cervical back: Normal range of motion and neck supple.     Comments: Full ROM. No midline cervical tenderness  Lymphadenopathy:     Cervical: Cervical adenopathy present.  Skin:    General: Skin is warm and dry.     Capillary Refill: Capillary refill takes less than 2 seconds.  Neurological:     General: No focal deficit present.     Mental Status: He is alert and oriented to person, place, and time.     Cranial Nerves: Cranial nerves 2-12 are intact.     Sensory: Sensation is intact.     Motor: Motor function is intact.     Coordination: Coordination is intact.     Gait: Gait is intact.     Comments: CN 2-12 grossly intact      ED Results / Procedures / Treatments   Labs (all labs ordered are listed, but only abnormal results are displayed) Labs Reviewed  CBC WITH DIFFERENTIAL/PLATELET - Abnormal; Notable for the following components:      Result Value   RBC 3.48 (*)    Hemoglobin 9.5 (*)    HCT 29.7 (*)    Platelets 140 (*)    All other components within normal limits  COMPREHENSIVE METABOLIC PANEL - Abnormal; Notable for the following components:   Albumin 3.3 (*)    All other components within normal limits  RESP PANEL BY RT-PCR  (FLU A&B, COVID) [id number]  GROUP A STREP BY PCR  RESPIRATORY PANEL BY PCR    EKG EKG Interpretation  Date/Time:  Tuesday October 03 2022 16:22:15 EST Ventricular Rate:  68 PR Interval:  228 QRS Duration: 82 QT Interval:  384 QTC Calculation: 408 R Axis:   11 Text Interpretation: Sinus rhythm with 1st degree A-V block with Premature atrial complexes Possible Anterior infarct , age undetermined Abnormal ECG When compared with ECG of 16-Sep-2022 12:29, 1st degree av block and PRWP are new Confirmed by Margaretmary Eddy [phone]) on 10/03/2022 5:44:28 PM  Radiology CT Soft Tissue Neck W Contrast  Result Date: 10/03/2022 CLINICAL DATA:  Initial evaluation for sore throat, history of lymphoma. EXAM: CT NECK WITH CONTRAST TECHNIQUE: Multidetector CT imaging of the neck was performed using the standard protocol following the bolus administration of intravenous contrast. RADIATION DOSE REDUCTION: This exam was performed according to the departmental dose-optimization program which includes automated exposure control, adjustment of the mA and/or kV according to patient size and/or use of iterative reconstruction technique. CONTRAST:  30m OMNIPAQUE IOHEXOL 350 MG/ML SOLN COMPARISON:  None Available. FINDINGS: Pharynx and larynx: Oral cavity within normal limits. No acute inflammatory changes seen about the dentition. Irregular bulky enlargement of the lymphoid tissues including the palatine tonsils and adenoidal soft tissues is seen, slightly worse on the right. Changes are somewhat nodular in appearance (series 3, image 72 along the right epiglottis and vallecula for example). Secondary partial effacement of the nasopharyngeal and oropharyngeal airway. Parapharyngeal fat is maintained. No retropharyngeal collection or swelling. Epiglottis itself is within normal limits. Remainder of the hypopharynx and supraglottic larynx within normal limits. Negative glottis. Subglottic airway patent clear. Salivary glands:  Salivary glands including the parotid and submandibular glands are within normal limits. Thyroid: Normal. Lymph nodes: Abnormal bulky enlarged adenopathy seen extending along the cervical chains bilaterally, right worse than left. Largest node/nodal conglomerate on the right seen at level II a and measures 2.7 x 3.5 cm (series 3, image 68). Largest discrete node on the  left seen at level II A as well and measures 1.6 x 1.9 cm (series 3, 69). Associated posterior cervical chain on the right, with right greater than left supraclavicular adenopathy. Prominent an enlarged bilateral axillary adenopathy is seen, worse on the left. Largest axillary node on the left measures 2.0 x 2.3 cm (series 3, image 122). Findings presumably related to provided history of lymphoma. Vascular: Normal intravascular enhancement seen throughout the neck. Limited intracranial: Unremarkable. Visualized orbits: Prior bilateral ocular lens replacement. Otherwise unremarkable. Mastoids and visualized paranasal sinuses: Mild mucoperiosteal thickening present about the right maxillary sinus. Visualized paranasal sinuses are otherwise largely clear. Mastoid air cells and middle ear cavities are well pneumatized and free of fluid. Skeleton: No discrete or worrisome osseous lesions. Moderate to advance spondylosis present at C4-5 through C6-7. Upper chest: Visualized upper chest demonstrates no other acute finding. Other: None. IMPRESSION: 1. Irregular bulky enlargement of the lymphoid tissues including the palatine tonsils and adenoidal soft tissues, right worse than left. Findings presumably related to provided history of lymphoma, although a primary head neck malignancy would be the primary differential consideration. Correlation with direct visualization recommended. 2. Abnormal enlarged bilateral cervical, supraclavicular, and axillary adenopathy, also presumably related to history of lymphoma. 3. No other acute abnormality within the neck. 4.  Moderate to advance spondylosis at C4-5 through C6-7. Electronically Signed   By: Jeannine Boga M.D.   On: 10/03/2022 20:27   CT HEAD WO CONTRAST (5MM)  Result Date: 10/03/2022 CLINICAL DATA:  Bone mass or bone pain, skull, no prior imaging headache, right parietal soft tissue mass?; sinusitis EXAM: CT HEAD WITHOUT CONTRAST CT MAXILLOFACIAL WITHOUT CONTRAST TECHNIQUE: Multidetector CT imaging of the head and maxillofacial structures were performed using the standard protocol without intravenous contrast. Multiplanar CT image reconstructions of the maxillofacial structures were also generated. RADIATION DOSE REDUCTION: This exam was performed according to the departmental dose-optimization program which includes automated exposure control, adjustment of the mA and/or kV according to patient size and/or use of iterative reconstruction technique. COMPARISON:  CT head 06/03/2022. FINDINGS: CT HEAD FINDINGS Brain: No evidence of large-territorial acute infarction. No parenchymal hemorrhage. No mass lesion. Right calvarial convexity 3 mm extra-axial fluid collection measuring slightly lower in density than gray matter. No mass effect or midline shift. No hydrocephalus. Basilar cisterns are patent. Vascular: No hyperdense vessel. Skull: No acute fracture or focal lesion. Other: There is a 1.6 x 1.2 cm right parietal scalp soft tissue dense. Associated punctate dermal calcification nonspecific. CT MAXILLOFACIAL FINDINGS Osseous: No fracture or mandibular dislocation. No destructive process. Sinuses/Orbits: Frothy secretions within the nasal cavities. Right maxillary sinus mucosal thickening. Paranasal sinuses and mastoid air cells are clear. Bilateral lens replacement. Otherwise the orbits are unremarkable. Soft tissues: Negative. Other: Visualized upper cervical spine demonstrates severe degenerative changes. Asymmetric airways (3:33). Cervical lymphadenopathy. IMPRESSION: 1. Likely subacute 3 mm right subdural  hematoma. No definite acute component. 2. Indeterminate 1.6 x 1.2 cm right parietal scalp soft tissue dense. Finding may represent a sebaceous cyst. Correlate clinically. 3. No acute displaced facial fracture. 4. Asymmetric airways with underlying mass lesion not excluded. Cervical lymphadenopathy. Please see separately dictated CT neck 10/03/2022. Electronically Signed   By: Iven Finn M.D.   On: 10/03/2022 20:09   CT Maxillofacial W Contrast  Result Date: 10/03/2022 CLINICAL DATA:  Bone mass or bone pain, skull, no prior imaging headache, right parietal soft tissue mass?; sinusitis EXAM: CT HEAD WITHOUT CONTRAST CT MAXILLOFACIAL WITHOUT CONTRAST TECHNIQUE: Multidetector CT imaging of the head  and maxillofacial structures were performed using the standard protocol without intravenous contrast. Multiplanar CT image reconstructions of the maxillofacial structures were also generated. RADIATION DOSE REDUCTION: This exam was performed according to the departmental dose-optimization program which includes automated exposure control, adjustment of the mA and/or kV according to patient size and/or use of iterative reconstruction technique. COMPARISON:  CT head 06/03/2022. FINDINGS: CT HEAD FINDINGS Brain: No evidence of large-territorial acute infarction. No parenchymal hemorrhage. No mass lesion. Right calvarial convexity 3 mm extra-axial fluid collection measuring slightly lower in density than gray matter. No mass effect or midline shift. No hydrocephalus. Basilar cisterns are patent. Vascular: No hyperdense vessel. Skull: No acute fracture or focal lesion. Other: There is a 1.6 x 1.2 cm right parietal scalp soft tissue dense. Associated punctate dermal calcification nonspecific. CT MAXILLOFACIAL FINDINGS Osseous: No fracture or mandibular dislocation. No destructive process. Sinuses/Orbits: Frothy secretions within the nasal cavities. Right maxillary sinus mucosal thickening. Paranasal sinuses and mastoid air  cells are clear. Bilateral lens replacement. Otherwise the orbits are unremarkable. Soft tissues: Negative. Other: Visualized upper cervical spine demonstrates severe degenerative changes. Asymmetric airways (3:33). Cervical lymphadenopathy. IMPRESSION: 1. Likely subacute 3 mm right subdural hematoma. No definite acute component. 2. Indeterminate 1.6 x 1.2 cm right parietal scalp soft tissue dense. Finding may represent a sebaceous cyst. Correlate clinically. 3. No acute displaced facial fracture. 4. Asymmetric airways with underlying mass lesion not excluded. Cervical lymphadenopathy. Please see separately dictated CT neck 10/03/2022. Electronically Signed   By: Iven Finn M.D.   On: 10/03/2022 20:09   DG Chest 2 View  Result Date: 10/03/2022 CLINICAL DATA:  URI. Nasal congestion, cough, and fatigue for 2 weeks. EXAM: CHEST - 2 VIEW COMPARISON:  Chest radiographs 09/16/2022 FINDINGS: The cardiomediastinal silhouette is unchanged with normal heart size. There is unchanged mild scarring in the left lung base. No acute airspace consolidation, edema, pleural effusion, or pneumothorax is identified. No acute osseous abnormality is seen. IMPRESSION: No active cardiopulmonary disease. Electronically Signed   By: Logan Bores M.D.   On: 10/03/2022 15:04    Procedures Procedures    Medications Ordered in ED Medications  dexamethasone (DECADRON) injection 8 mg (8 mg Intravenous Given 10/03/22 1647)  iohexol (OMNIPAQUE) 350 MG/ML injection 75 mL (75 mLs Intravenous Contrast Given 10/03/22 1932)    ED Course/ Medical Decision Making/ A&P Clinical Course as of 10/03/22 2138  Tue Oct 03, 2022  2057 MAP (mmHg): 79 [RP]    Clinical Course User Index [RP] Fransico Meadow, MD    76 year old here for evaluation of sore throat, difficulty swallowing, congestion and fatigue over the last few weeks.  Seen in ED at symptom onset diagnosed with uveitis given steroids and antibiotics.  Mild improvement  however returned.  History of mantle cell lymphoma currently being followed by oncology.  Has diffuse cervical lymphadenopathy.  Uvula mildly edematous however no pooling of secretions.  No angioedema.  Heart and lungs clear.  Does have firm, nontender mass to right parietal region.  Does not look obviously infectious.  Will plan on CT his head, neck as well as labs.  Will treat Decadron as he states this helped his symptoms previously  Labs and imaging personally viewed and interpreted:  COVID, FLU neg CBC no leukocytosis, hemoglobin 9.5, similar to prior.  Denies melena, bright blood per rectum, epistaxis or easy bleeding/ bruising CMP without significant abnormality Strep neg Chest xray cardiomegaly, pulm edema, pneumothorax CT head shows subacute 3 mm right subdural hematoma, no definite  acute component, reviewed imaging from August.  Same findings with subacute hematoma vs chronic hygroma.  This is likely chronic in nature,  Indeterminate 1.6 x 1.2 cm right parietal scalp soft tissue density may represent sebaceous cyst CT soft tissue neck bulky enlargement lymphoid tissue including tonsils right greater than left, presumably related to lymphoma although cannot rule out primary head neck malignancy, abnormal enlarged bilateral cervical, supraclavicular, axillary adenopathy also presumably related to lymphoma RVP pending  Reassessed.  I discussed labs and imaging with patient and family.  Concerning for recurrence of lymphoma.  Will touch base with oncology  CONSULT with Dr. Irene Limbo with Oncology.  Can do steroids, follow-up outpatient for biopsy  Discussed plan with patient and wife in room.  He is tolerating p.o. intake.  No acute respiratory distress.  No pooling of secretions.  Low suspicion for abscess, deep space infectious process. Low suspicion for infection as this is been going on for greater than 3 weeks.  He has no fever, leukocytosis.  No evidence of angioedema.  Low suspicion for PTA  or RPA.  Will DC home with steroids.  Do not feel he needs antibiotics at this time  The patient has been appropriately medically screened and/or stabilized in the ED. I have low suspicion for any other emergent medical condition which would require further screening, evaluation or treatment in the ED or require inpatient management.  Patient is hemodynamically stable and in no acute distress.  Patient able to ambulate in department prior to ED.  Evaluation does not show acute pathology that would require ongoing or additional emergent interventions while in the emergency department or further inpatient treatment.  I have discussed the diagnosis with the patient and answered all questions.  Pain is been managed while in the emergency department and patient has no further complaints prior to discharge.  Patient is comfortable with plan discussed in room and is stable for discharge at this time.  I have discussed strict return precautions for returning to the emergency department.  Patient was encouraged to follow-up with PCP/specialist refer to at discharge. .  Patient seen and evaluated by attending who agrees with above treatment plan and disposition.                           Medical Decision Making Amount and/or Complexity of Data Reviewed Independent Historian: spouse External Data Reviewed: labs, radiology, ECG and notes. Labs: ordered. Decision-making details documented in ED Course. Radiology: ordered and independent interpretation performed. Decision-making details documented in ED Course. ECG/medicine tests: ordered and independent interpretation performed. Decision-making details documented in ED Course.  Risk OTC drugs. Prescription drug management. Parenteral controlled substances. Decision regarding hospitalization. Diagnosis or treatment significantly limited by social determinants of health.          Final Clinical Impression(s) / ED Diagnoses Final diagnoses:   Lymphadenopathy    Rx / DC Orders ED Discharge Orders          Ordered    predniSONE (DELTASONE) 20 MG tablet  Daily        10/03/22 2134              Jakhiya Brower A, PA-C 10/03/22 2139    Fransico Meadow, MD 10/15/22 1135

## 2022-10-03 NOTE — ED Notes (Signed)
Patient transported to CT 

## 2022-10-03 NOTE — ED Triage Notes (Addendum)
Pt arrived POV from home c/o fatigue, nasal congestion, and runny nose. Pt also endorses sore throat. Per family this has been ongoing for 3 weeks.

## 2022-10-03 NOTE — Progress Notes (Shared)
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 is here for continued evaluation and management of his mantle cell lymphoma.    He was last seen by me on 05/31/2022 and was doing well overall without any medical concerns.  Today,  MEDICAL HISTORY:  Past Medical History:  Diagnosis Date   Arthritis    Chronic kidney disease, stage 3b ([place]) 12/09/2021   Dyslipidemia    GERD (gastroesophageal reflux disease)    Hypertension    PAF (paroxysmal atrial fibrillation) (HCC)    Prostate cancer ([place]) 03/2021   s/p radiation therapy   PTSD (post-traumatic stress disorder)     SURGICAL HISTORY: Past Surgical History:  Procedure Laterality Date   LIPOMA EXCISION Right 12/09/2021   Procedure: RIGHT SUPERCLAVICULAR LYMPH NODE EXCISION;  Surgeon: Ileana Roup, MD;  Location: [facility] OR;  Service: General;  Laterality: Right;    SOCIAL HISTORY: Social History   Socioeconomic History   Marital status: Married    Spouse name: Not on file   Number of children: Not on file   Years of education: Not on file   Highest education level: Not on file  Occupational History   Occupation: retired  Tobacco Use   Smoking status: Former    Packs/day: 1.00    Years: 15.00    Total pack years: 15.00    Types: Cigarettes   Smokeless tobacco: Never  Substance and Sexual Activity   Alcohol use: Not Currently    Comment: h/o heavy use   Drug use: Not Currently    Types: Marijuana, Cocaine    Comment: remote   Sexual activity: Not on file  Other Topics Concern   Not on file  Social History Narrative   Not on file   Social Determinants of  Health   Financial Resource Strain: Not on file  Food Insecurity: Not on file  Transportation Needs: Not on file  Physical Activity: Not on file  Stress: Not on file  Social Connections: Not on file  Intimate Partner Violence: Not on file    FAMILY HISTORY: Family History  Problem Relation Age of Onset   Cancer Neg Hx     ALLERGIES:  is allergic to sildenafil.  MEDICATIONS:  Current Outpatient Medications  Medication Sig Dispense Refill   acyclovir (ZOVIRAX) 400 MG tablet TAKE 1 TABLET(400 MG) BY MOUTH DAILY 30 tablet 3   ammonium lactate (LAC-HYDRIN) 12 % lotion Apply 1 application topically 2 (two) times daily as needed for dry skin.     amoxicillin-clavulanate (AUGMENTIN) 875-125 MG tablet Take 1 tablet by mouth every 12 (twelve) hours. 13 tablet 0   atorvastatin (LIPITOR) 20 MG tablet Take 10 mg by mouth at bedtime.     Cholecalciferol (VITAMIN D3 PO) Take 1 tablet by mouth every morning.     diclofenac Sodium (VOLTAREN) 1 % GEL Apply 2 g topically 4 (four) times daily as needed (pain).     feeding supplement (ENSURE ENLIVE / ENSURE PLUS) LIQD Take 237 mLs by mouth 2 (two) times daily between meals. 237 mL 12   flecainide (TAMBOCOR) 100 MG tablet Take 100 mg by mouth every 12 (twelve) hours.     fluticasone (FLONASE)  50 MCG/ACT nasal spray Place 1 spray into both nostrils daily as needed for allergies or rhinitis.     hydroxypropyl methylcellulose / hypromellose (ISOPTO TEARS / GONIOVISC) 2.5 % ophthalmic solution Place 2 drops into both eyes 2 (two) times daily as needed for dry eyes.     LORazepam (ATIVAN) 0.5 MG tablet Take 1 tablet (0.5 mg total) by mouth every 6 (six) hours as needed (Nausea or vomiting). (Patient not taking: Reported on 06/03/2022) 30 tablet 0   metoprolol tartrate (LOPRESSOR) 25 MG tablet Take 12.5 mg by mouth 2 (two) times daily. Take with flecainide     mirtazapine (REMERON) 30 MG tablet Take 30 mg by mouth at bedtime.     omeprazole (PRILOSEC) 20 MG  capsule Take 20 mg by mouth daily before breakfast.     ondansetron (ZOFRAN) 8 MG tablet Take 1 tablet (8 mg total) by mouth 2 (two) times daily as needed for refractory nausea / vomiting. Start on day 2 after bendamustine chemo. 30 tablet 1   potassium chloride SA (KLOR-CON M) 20 MEQ tablet Take 1 tablet (20 mEq total) by mouth 2 (two) times daily. 60 tablet 0   prazosin (MINIPRESS) 2 MG capsule Take 6 mg by mouth at bedtime.     prochlorperazine (COMPAZINE) 10 MG tablet Take 1 tablet (10 mg total) by mouth every 6 (six) hours as needed (Nausea or vomiting). 30 tablet 1   terbinafine (LAMISIL) 1 % cream Apply 1 application topically 2 (two) times daily as needed (athlete's foot).     torsemide (DEMADEX) 10 MG tablet Take 20 mg by mouth daily.     triamcinolone cream (KENALOG) 0.1 % Apply 1 application topically 2 (two) times daily as needed (rash).     No current facility-administered medications for this visit.    REVIEW OF SYSTEMS:   10 Point review of Systems was done is negative except as noted above.  PHYSICAL EXAMINATION: .There were no vitals taken for this visit. NAD GENERAL:alert, in no acute distress and comfortable SKIN: no acute rashes, no significant lesions EYES: conjunctiva are pink and non-injected, sclera anicteric OROPHARYNX: MMM, no exudates, no oropharyngeal erythema or ulceration NECK: supple, no JVD LYMPH:  no palpable lymphadenopathy in the cervical, axillary or inguinal regions LUNGS: clear to auscultation b/l with normal respiratory effort HEART: regular rate & rhythm ABDOMEN:  normoactive bowel sounds , non tender, not distended. Extremity: no pedal edema PSYCH: alert & oriented x 3 with fluent speech NEURO: no focal motor/sensory deficits   LABORATORY DATA:  I have reviewed the data as listed  .    Latest Ref Rng & Units 09/08/2022   10:14 AM 06/03/2022    1:09 PM 05/25/2022    2:30 PM  CBC  WBC 4.0 - 10.5 K/uL 3.3  2.3  2.2   Hemoglobin 13.0 - 17.0  g/dL 10.7  11.3  10.3   Hematocrit 39.0 - 52.0 % 34.3  38.4  31.4   Platelets 150 - 400 K/uL 182  149  151   ANC 700     Latest Ref Rng & Units 09/08/2022   10:14 AM 06/03/2022    1:09 PM 05/25/2022    2:30 PM  CMP  Glucose 70 - 99 mg/dL 106  89  90   BUN 8 - 23 mg/dL '27  27  20   '$ Creatinine 0.61 - 1.24 mg/dL 0.94  1.05  1.03   Sodium 135 - 145 mmol/L 139  138  143  Potassium 3.5 - 5.1 mmol/L 3.7  4.0  3.1   Chloride 98 - 111 mmol/L 107  105  109   CO2 22 - 32 mmol/L '25  25  28   '$ Calcium 8.9 - 10.3 mg/dL 9.7  9.5  9.6   Total Protein 6.5 - 8.1 g/dL 8.2   7.4   Total Bilirubin 0.3 - 1.2 mg/dL 0.5   0.4   Alkaline Phos 38 - 126 U/L 71   62   AST 15 - 41 U/L 19   17   ALT 0 - 44 U/L 13   9   . Lab Results  Component Value Date   LDH 151 05/25/2022    . Uric acid 4.9 Surgical Pathology  CASE: [id number]  PATIENT: Giomar Colvard  Flow Pathology Report  Clinical history: Mantle Cell Lymphoma of lymph nodes of multiple  regions  DIAGNOSIS:  -Monoclonal B-cell population identified  -See comment   COMMENT:   The findings are consistent with involvement by previously known mantle  cell lymphoma.    RADIOGRAPHIC STUDIES: I have personally reviewed the radiological images as listed and agreed with the findings in the report.  DG Chest 2 View  Result Date: 09/16/2022 CLINICAL DATA:  Shortness of breath. EXAM: CHEST - 2 VIEW COMPARISON:  09/08/2022 and prior studies FINDINGS: The cardiomediastinal silhouette is unremarkable. Mild peribronchial thickening is unchanged. There is no evidence of focal airspace disease, pulmonary edema, suspicious pulmonary nodule/mass, pleural effusion, or pneumothorax. No acute bony abnormalities are identified. IMPRESSION: No active cardiopulmonary disease. Electronically Signed   By: Margarette Canada M.D.   On: 09/16/2022 13:26   DG Chest Portable 1 View  Result Date: 09/08/2022 CLINICAL DATA:  Syncope. EXAM: PORTABLE CHEST 1 VIEW COMPARISON:   Chest x-ray dated December 14, 2021. FINDINGS: The heart size and mediastinal contours are within normal limits. Minimal linear scarring at the left lung base. No focal consolidation, pleural effusion, or pneumothorax. The visualized skeletal structures are unremarkable. IMPRESSION: No active disease. Electronically Signed   By: Titus Dubin M.D.   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 PET CT scan 12/29/2021  1. Enlarged hypermetabolic cervical, bilateral axillary, retroperitoneal, pelvic, and inguinal lymph nodes. [place] 4. 2. Subcentimeter mediastinal lymph nodes with uptake similar to mediastinal blood pool. 3. Focal hypermetabolic uptake seen at the head of the right clavicle, concerning for osseous involvement. 4. Splenomegaly. 5. Increased tonsillar soft tissue, right greater than left, concerning for lymphomatous involvement.  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  3) moderate to severe protein calorie malnutrition-much improved patient is gaining weight and has been eating much better. . Wt Readings from Last 3 Encounters:  09/08/22 195 lb (88.5 kg)  06/03/22 195 lb (88.5 kg)  05/31/22 200 lb (90.7 kg)     4) acute renal injury-likely due poor p.o. intake.  Due to spontaneous tumor lysis.  Also could be due to his hematuria and some element of urinary obstruction.  Now resolved  5) massive  splenomegaly due  to mantle cell lymphoma.  Now resolved on repeat imaging-  6) status post tumor lysis syndrome  PLAN Patient has now completed 5 cycles of Bendamustine and 1 cycle of Rituxan alone. PET CT scan done on 05/25/2022 shows complete metabolic response of the patient's mantle cell lymphoma. -Labs done today were reviewed in detail with the patient. -We discussed and patient is agreeable to proceed with maintenance Rituxan therapy for at least 2 to 3 years if tolerated.- -No other acute new symptoms at this time.  No respiratory treatment related toxicities at this time. Please schedule to start maintenance Rituxan in 1 month with labs and MD visit Please schedule for 3 doses of maintenance Rituxan every 2 months starting    Follow-up ***  The total time spent in the appointment was *** minutes* .  All of the patient's questions were answered with apparent satisfaction. The patient knows to call the clinic with any problems, questions or concerns.   Sullivan Lone MD MS AAHIVMS [facility] Hematology/Oncology Physician [facility]  .*Total Encounter Time as defined by the Centers for Medicare and Medicaid Services includes, in addition to the face-to-face time of a patient visit (documented in the note above) non-face-to-face time: obtaining and reviewing outside history, ordering and reviewing medications, tests or procedures, care coordination (communications with other health care professionals or caregivers) and documentation in the medical record.   I,Param Shah,acting as a Education administrator for Sullivan Lone, MD.

## 2022-10-03 NOTE — ED Provider Triage Note (Signed)
Emergency Medicine Provider Triage Evaluation Note  Jerry Tran , a 76 y.o. male  was evaluated in triage.  Pt complains of nasal congestion, sore throat, and fatigue for three weeks. He was originally treated with antibiotics but this did not help. He then restarted nasal spray and guaifenesin.  They came here today because he started having worsening right sided neck swelling.   He has hx of lymphoma, currently receiving chemotherapy  Review of Systems  Positive:  Negative:   Physical Exam  BP 99/65 (BP Location: Right Arm)   Pulse 78   Temp 98.5 F (36.9 C)   Resp 16   SpO2 100%  Gen:   Awake, no distress   Resp:  Normal effort  MSK:   Moves extremities without difficulty  Other:  Right ant/post lymphadenopathy, throat without PTA  Medical Decision Making  Medically screening exam initiated at 10:42 AM.  Appropriate orders placed.  Jerry Tran was informed that the remainder of the evaluation will be completed by another provider, this initial triage assessment does not replace that evaluation, and the importance of remaining in the ED until their evaluation is complete.     Jerry Birchwood, PA-C 10/03/22 1044

## 2022-10-03 NOTE — Discharge Instructions (Signed)
Call Dr. Irene Limbo office tomorrow to schedule an appointment.  Start on the steroids.  Take the steroids as prescribed  Return for new or worsening symptoms such as persistent fever, difficulty swallowing, shortness of breath, facial swelling

## 2022-10-04 ENCOUNTER — Inpatient Hospital Stay: Payer: Medicare Other | Attending: Hematology and Oncology | Admitting: Hematology

## 2022-10-04 ENCOUNTER — Ambulatory Visit: Payer: Medicare Other

## 2022-10-04 VITALS — BP 110/64 | HR 75 | Temp 97.6°F | Resp 16 | Ht 76.0 in | Wt 202.2 lb

## 2022-10-04 DIAGNOSIS — R0981 Nasal congestion: Secondary | ICD-10-CM | POA: Insufficient documentation

## 2022-10-04 DIAGNOSIS — Z8546 Personal history of malignant neoplasm of prostate: Secondary | ICD-10-CM | POA: Insufficient documentation

## 2022-10-04 DIAGNOSIS — Z87891 Personal history of nicotine dependence: Secondary | ICD-10-CM | POA: Diagnosis not present

## 2022-10-04 DIAGNOSIS — D696 Thrombocytopenia, unspecified: Secondary | ICD-10-CM | POA: Insufficient documentation

## 2022-10-04 DIAGNOSIS — R059 Cough, unspecified: Secondary | ICD-10-CM | POA: Diagnosis not present

## 2022-10-04 DIAGNOSIS — I480 Paroxysmal atrial fibrillation: Secondary | ICD-10-CM | POA: Insufficient documentation

## 2022-10-04 DIAGNOSIS — D649 Anemia, unspecified: Secondary | ICD-10-CM | POA: Diagnosis not present

## 2022-10-04 DIAGNOSIS — C8318 Mantle cell lymphoma, lymph nodes of multiple sites: Secondary | ICD-10-CM | POA: Insufficient documentation

## 2022-10-04 DIAGNOSIS — E883 Tumor lysis syndrome: Secondary | ICD-10-CM | POA: Diagnosis not present

## 2022-10-04 DIAGNOSIS — I129 Hypertensive chronic kidney disease with stage 1 through stage 4 chronic kidney disease, or unspecified chronic kidney disease: Secondary | ICD-10-CM | POA: Insufficient documentation

## 2022-10-04 DIAGNOSIS — Z5112 Encounter for antineoplastic immunotherapy: Secondary | ICD-10-CM | POA: Insufficient documentation

## 2022-10-04 DIAGNOSIS — N1832 Chronic kidney disease, stage 3b: Secondary | ICD-10-CM | POA: Insufficient documentation

## 2022-10-04 DIAGNOSIS — R590 Localized enlarged lymph nodes: Secondary | ICD-10-CM | POA: Insufficient documentation

## 2022-10-04 DIAGNOSIS — E785 Hyperlipidemia, unspecified: Secondary | ICD-10-CM | POA: Insufficient documentation

## 2022-10-04 LAB — RESPIRATORY PANEL BY PCR

## 2022-10-04 MED ORDER — PREDNISONE 20 MG PO TABS: 60.0000 mg | ORAL_TABLET | Freq: Every day | ORAL | 0 refills | Status: AC

## 2022-10-04 MED ORDER — DOXYCYCLINE HYCLATE 100 MG PO TABS: 100.0000 mg | ORAL_TABLET | Freq: Two times a day (BID) | ORAL | 0 refills | Status: AC

## 2022-10-04 NOTE — Progress Notes (Signed)
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  He has completed 5 cycles of Bendamustine Rituxan No. 1 dose of Rituxan as a part of his primary induction treatment.  Patient was last seen by me on 05/31/22 and was doing well overall without any new medical concerns. He was lost to f/u for a few months due to note knowing his appointment and then from covid 19 infection.  Patient is here with his wife and reports he  has been doing fairly well since our last visit. He notes that he noticed enlarged lymph nodes near his neck 3 weeks ago. Patient's wife notes she noticed the enlarged lymph nodes near his neck area a week after he tested positive for COVID-19. Patient was seen at the ED yesterday, 10/03/2022. Attending physician at the ED started the patient on steroids.  Patient's wife notes he tested positive for COVID-19 around 3-4 weeks. He took Paxlovid. Patient had cough with green phlegm, chills, and fever. He still has congestion and cough with green phlegm and blood.   Patient notes his lymph nodes are increasing in size.   He complains of difficulty swallowing food (solid food), enlarged lymph node in the groin, and new lump on his headache. He notes that he hit his head which caused a lump. Patient reports that he noticed the enlarged lymph node in the groin about 2 days ago. He denies headache, breathing problem, abdominal pain, groin pain, weight loss, or leg swelling.   Patient's wife notes that the patient has breathing problem when he is sleeping at night.     MEDICAL HISTORY:  Past Medical History:  Diagnosis Date   Arthritis    Chronic kidney disease, stage 3b ([place]) 12/09/2021   Dyslipidemia    GERD (gastroesophageal reflux disease)    Hypertension    PAF (paroxysmal atrial fibrillation) (HCC)    Prostate cancer ([place]) 03/2021   s/p radiation therapy   PTSD (post-traumatic stress disorder)     SURGICAL HISTORY: Past Surgical History:  Procedure Laterality Date   LIPOMA EXCISION Right 12/09/2021   Procedure: RIGHT SUPERCLAVICULAR LYMPH NODE EXCISION;  Surgeon: Ileana Roup, MD;  Location: [facility] OR;  Service: General;  Laterality: Right;    SOCIAL HISTORY: Social History   Socioeconomic History   Marital status: Married    Spouse name: Not on file   Number of children: Not on file   Years of education: Not on file   Highest education level: Not on file  Occupational History   Occupation: retired  Tobacco Use   Smoking status: Former    Packs/day: 1.00    Years: 15.00    Total pack years: 15.00    Types: Cigarettes   Smokeless tobacco: Never  Substance and Sexual Activity   Alcohol use: Not Currently    Comment: h/o heavy use   Drug use: Not Currently    Types: Marijuana, Cocaine    Comment: remote   Sexual activity: Not on file  Other Topics Concern   Not on file  Social History Narrative   Not on file   Social Determinants of  Health   Financial Resource Strain: Not on file  Food Insecurity: Not on file  Transportation Needs: Not on file  Physical Activity: Not on file  Stress: Not on file  Social Connections: Not on file  Intimate Partner Violence: Not on file    FAMILY HISTORY: Family History  Problem Relation Age of Onset   Cancer Neg Hx     ALLERGIES:  is allergic to sildenafil.  MEDICATIONS:  Current Outpatient Medications  Medication Sig Dispense Refill   acyclovir (ZOVIRAX) 400 MG tablet TAKE 1 TABLET(400 MG) BY MOUTH DAILY 30 tablet 3   ammonium lactate (LAC-HYDRIN) 12 % lotion Apply  1 application topically 2 (two) times daily as needed for dry skin.     amoxicillin-clavulanate (AUGMENTIN) 875-125 MG tablet Take 1 tablet by mouth every 12 (twelve) hours. 13 tablet 0   atorvastatin (LIPITOR) 20 MG tablet Take 10 mg by mouth at bedtime.     Cholecalciferol (VITAMIN D3 PO) Take 1 tablet by mouth every morning.     diclofenac Sodium (VOLTAREN) 1 % GEL Apply 2 g topically 4 (four) times daily as needed (pain).     feeding supplement (ENSURE ENLIVE / ENSURE PLUS) LIQD Take 237 mLs by mouth 2 (two) times daily between meals. 237 mL 12   flecainide (TAMBOCOR) 100 MG tablet Take 100 mg by mouth every 12 (twelve) hours.     fluticasone (FLONASE) 50 MCG/ACT nasal spray Place 1 spray into both nostrils daily as needed for allergies or rhinitis.     hydroxypropyl methylcellulose / hypromellose (ISOPTO TEARS / GONIOVISC) 2.5 % ophthalmic solution Place 2 drops into both eyes 2 (two) times daily as needed for dry eyes.     LORazepam (ATIVAN) 0.5 MG tablet Take 1 tablet (0.5 mg total) by mouth every 6 (six) hours as needed (Nausea or vomiting). (Patient not taking: Reported on 06/03/2022) 30 tablet 0   metoprolol tartrate (LOPRESSOR) 25 MG tablet Take 12.5 mg by mouth 2 (two) times daily. Take with flecainide     mirtazapine (REMERON) 30 MG tablet Take 30 mg by mouth at bedtime.     omeprazole (PRILOSEC) 20 MG capsule Take 20 mg by mouth daily before breakfast.     ondansetron (ZOFRAN) 8 MG tablet Take 1 tablet (8 mg total) by mouth 2 (two) times daily as needed for refractory nausea / vomiting. Start on day 2 after bendamustine chemo. 30 tablet 1   potassium chloride SA (KLOR-CON M) 20 MEQ tablet Take 1 tablet (20 mEq total) by mouth 2 (two) times daily. 60 tablet 0   prazosin (MINIPRESS) 2 MG capsule Take 6 mg by mouth at bedtime.     predniSONE (DELTASONE) 20 MG tablet Take 1 tablet (20 mg total) by mouth daily for 5 days. 5 tablet 0   prochlorperazine (COMPAZINE) 10 MG tablet Take 1 tablet  (10 mg total) by mouth every 6 (six) hours as needed (Nausea or vomiting). 30 tablet 1   terbinafine (LAMISIL) 1 % cream Apply 1 application topically 2 (two) times daily as needed (athlete's foot).     torsemide (DEMADEX) 10 MG tablet Take 20 mg by mouth daily.     triamcinolone cream (KENALOG) 0.1 % Apply 1 application topically 2 (two) times daily as needed (rash).     No current facility-administered medications for this visit.    REVIEW OF SYSTEMS:   10 Point review of Systems was done is negative except as noted above.  PHYSICAL EXAMINATION: .BP 110/64 (BP Location:  Left Arm, Patient Position: Sitting)   Pulse 75   Temp 97.6 F (36.4 C) (Temporal)   Resp 16   Ht '6\' 4"'$  (1.93 m)   Wt 202 lb 3.2 oz (91.7 kg)   SpO2 100%   BMI 24.61 kg/m  NAD GENERAL:alert, in no acute distress and comfortable SKIN: no acute rashes, no significant lesions EYES: conjunctiva are pink and non-injected, sclera anicteric OROPHARYNX: MMM, no exudates, no oropharyngeal erythema or ulceration NECK: supple, no JVD LYMPH: Significant new palpable lymphadenopathy in the cervical axillary areas as well as the inguinal areas LUNGS: clear to auscultation b/l with normal respiratory effort HEART: regular rate & rhythm ABDOMEN:  normoactive bowel sounds , non tender, not distended. Extremity: no pedal edema PSYCH: alert & oriented x 3 with fluent speech NEURO: no focal motor/sensory deficits   LABORATORY DATA:  I have reviewed the data as listed  .    Latest Ref Rng & Units 10/03/2022    4:38 PM 09/08/2022   10:14 AM 06/03/2022    1:09 PM  CBC  WBC 4.0 - 10.5 K/uL 4.3  3.3  2.3   Hemoglobin 13.0 - 17.0 g/dL 9.5  10.7  11.3   Hematocrit 39.0 - 52.0 % 29.7  34.3  38.4   Platelets 150 - 400 K/uL 140  182  149   ANC 700     Latest Ref Rng & Units 10/03/2022    4:38 PM 09/08/2022   10:14 AM 06/03/2022    1:09 PM  CMP  Glucose 70 - 99 mg/dL 86  106  89   BUN 8 - 23 mg/dL '21  27  27   '$ Creatinine  0.61 - 1.24 mg/dL 1.04  0.94  1.05   Sodium 135 - 145 mmol/L 139  139  138   Potassium 3.5 - 5.1 mmol/L 3.5  3.7  4.0   Chloride 98 - 111 mmol/L 108  107  105   CO2 22 - 32 mmol/L '23  25  25   '$ Calcium 8.9 - 10.3 mg/dL 9.3  9.7  9.5   Total Protein 6.5 - 8.1 g/dL 7.2  8.2    Total Bilirubin 0.3 - 1.2 mg/dL 0.5  0.5    Alkaline Phos 38 - 126 U/L 64  71    AST 15 - 41 U/L 18  19    ALT 0 - 44 U/L 13  13    . Lab Results  Component Value Date   LDH 151 05/25/2022    . Uric acid 4.9 Surgical Pathology  CASE: [id number]  PATIENT: Jerry Tran  Flow Pathology Report  Clinical history: Mantle Cell Lymphoma of lymph nodes of multiple  regions  DIAGNOSIS:  -Monoclonal B-cell population identified  -See comment   COMMENT:   The findings are consistent with involvement by previously known mantle  cell lymphoma.    RADIOGRAPHIC STUDIES: I have personally reviewed the radiological images as listed and agreed with the findings in the report.  CT Soft Tissue Neck W Contrast  Result Date: 10/03/2022 CLINICAL DATA:  Initial evaluation for sore throat, history of lymphoma. EXAM: CT NECK WITH CONTRAST TECHNIQUE: Multidetector CT imaging of the neck was performed using the standard protocol following the bolus administration of intravenous contrast. RADIATION DOSE REDUCTION: This exam was performed according to the departmental dose-optimization program which includes automated exposure control, adjustment of the mA and/or kV according to patient size and/or use of iterative reconstruction technique. CONTRAST:  77m OMNIPAQUE IOHEXOL 350 MG/ML  SOLN COMPARISON:  None Available. FINDINGS: Pharynx and larynx: Oral cavity within normal limits. No acute inflammatory changes seen about the dentition. Irregular bulky enlargement of the lymphoid tissues including the palatine tonsils and adenoidal soft tissues is seen, slightly worse on the right. Changes are somewhat nodular in appearance (series 3,  image 72 along the right epiglottis and vallecula for example). Secondary partial effacement of the nasopharyngeal and oropharyngeal airway. Parapharyngeal fat is maintained. No retropharyngeal collection or swelling. Epiglottis itself is within normal limits. Remainder of the hypopharynx and supraglottic larynx within normal limits. Negative glottis. Subglottic airway patent clear. Salivary glands: Salivary glands including the parotid and submandibular glands are within normal limits. Thyroid: Normal. Lymph nodes: Abnormal bulky enlarged adenopathy seen extending along the cervical chains bilaterally, right worse than left. Largest node/nodal conglomerate on the right seen at level II a and measures 2.7 x 3.5 cm (series 3, image 68). Largest discrete node on the left seen at level II A as well and measures 1.6 x 1.9 cm (series 3, 69). Associated posterior cervical chain on the right, with right greater than left supraclavicular adenopathy. Prominent an enlarged bilateral axillary adenopathy is seen, worse on the left. Largest axillary node on the left measures 2.0 x 2.3 cm (series 3, image 122). Findings presumably related to provided history of lymphoma. Vascular: Normal intravascular enhancement seen throughout the neck. Limited intracranial: Unremarkable. Visualized orbits: Prior bilateral ocular lens replacement. Otherwise unremarkable. Mastoids and visualized paranasal sinuses: Mild mucoperiosteal thickening present about the right maxillary sinus. Visualized paranasal sinuses are otherwise largely clear. Mastoid air cells and middle ear cavities are well pneumatized and free of fluid. Skeleton: No discrete or worrisome osseous lesions. Moderate to advance spondylosis present at C4-5 through C6-7. Upper chest: Visualized upper chest demonstrates no other acute finding. Other: None. IMPRESSION: 1. Irregular bulky enlargement of the lymphoid tissues including the palatine tonsils and adenoidal soft tissues,  right worse than left. Findings presumably related to provided history of lymphoma, although a primary head neck malignancy would be the primary differential consideration. Correlation with direct visualization recommended. 2. Abnormal enlarged bilateral cervical, supraclavicular, and axillary adenopathy, also presumably related to history of lymphoma. 3. No other acute abnormality within the neck. 4. Moderate to advance spondylosis at C4-5 through C6-7. Electronically Signed   By: Jeannine Boga M.D.   On: 10/03/2022 20:27   CT HEAD WO CONTRAST (5MM)  Result Date: 10/03/2022 CLINICAL DATA:  Bone mass or bone pain, skull, no prior imaging headache, right parietal soft tissue mass?; sinusitis EXAM: CT HEAD WITHOUT CONTRAST CT MAXILLOFACIAL WITHOUT CONTRAST TECHNIQUE: Multidetector CT imaging of the head and maxillofacial structures were performed using the standard protocol without intravenous contrast. Multiplanar CT image reconstructions of the maxillofacial structures were also generated. RADIATION DOSE REDUCTION: This exam was performed according to the departmental dose-optimization program which includes automated exposure control, adjustment of the mA and/or kV according to patient size and/or use of iterative reconstruction technique. COMPARISON:  CT head 06/03/2022. FINDINGS: CT HEAD FINDINGS Brain: No evidence of large-territorial acute infarction. No parenchymal hemorrhage. No mass lesion. Right calvarial convexity 3 mm extra-axial fluid collection measuring slightly lower in density than gray matter. No mass effect or midline shift. No hydrocephalus. Basilar cisterns are patent. Vascular: No hyperdense vessel. Skull: No acute fracture or focal lesion. Other: There is a 1.6 x 1.2 cm right parietal scalp soft tissue dense. Associated punctate dermal calcification nonspecific. CT MAXILLOFACIAL FINDINGS Osseous: No fracture or mandibular dislocation. No destructive process.  Sinuses/Orbits: Frothy  secretions within the nasal cavities. Right maxillary sinus mucosal thickening. Paranasal sinuses and mastoid air cells are clear. Bilateral lens replacement. Otherwise the orbits are unremarkable. Soft tissues: Negative. Other: Visualized upper cervical spine demonstrates severe degenerative changes. Asymmetric airways (3:33). Cervical lymphadenopathy. IMPRESSION: 1. Likely subacute 3 mm right subdural hematoma. No definite acute component. 2. Indeterminate 1.6 x 1.2 cm right parietal scalp soft tissue dense. Finding may represent a sebaceous cyst. Correlate clinically. 3. No acute displaced facial fracture. 4. Asymmetric airways with underlying mass lesion not excluded. Cervical lymphadenopathy. Please see separately dictated CT neck 10/03/2022. Electronically Signed   By: Iven Finn M.D.   On: 10/03/2022 20:09   CT Maxillofacial W Contrast  Result Date: 10/03/2022 CLINICAL DATA:  Bone mass or bone pain, skull, no prior imaging headache, right parietal soft tissue mass?; sinusitis EXAM: CT HEAD WITHOUT CONTRAST CT MAXILLOFACIAL WITHOUT CONTRAST TECHNIQUE: Multidetector CT imaging of the head and maxillofacial structures were performed using the standard protocol without intravenous contrast. Multiplanar CT image reconstructions of the maxillofacial structures were also generated. RADIATION DOSE REDUCTION: This exam was performed according to the departmental dose-optimization program which includes automated exposure control, adjustment of the mA and/or kV according to patient size and/or use of iterative reconstruction technique. COMPARISON:  CT head 06/03/2022. FINDINGS: CT HEAD FINDINGS Brain: No evidence of large-territorial acute infarction. No parenchymal hemorrhage. No mass lesion. Right calvarial convexity 3 mm extra-axial fluid collection measuring slightly lower in density than gray matter. No mass effect or midline shift. No hydrocephalus. Basilar cisterns are patent. Vascular: No hyperdense  vessel. Skull: No acute fracture or focal lesion. Other: There is a 1.6 x 1.2 cm right parietal scalp soft tissue dense. Associated punctate dermal calcification nonspecific. CT MAXILLOFACIAL FINDINGS Osseous: No fracture or mandibular dislocation. No destructive process. Sinuses/Orbits: Frothy secretions within the nasal cavities. Right maxillary sinus mucosal thickening. Paranasal sinuses and mastoid air cells are clear. Bilateral lens replacement. Otherwise the orbits are unremarkable. Soft tissues: Negative. Other: Visualized upper cervical spine demonstrates severe degenerative changes. Asymmetric airways (3:33). Cervical lymphadenopathy. IMPRESSION: 1. Likely subacute 3 mm right subdural hematoma. No definite acute component. 2. Indeterminate 1.6 x 1.2 cm right parietal scalp soft tissue dense. Finding may represent a sebaceous cyst. Correlate clinically. 3. No acute displaced facial fracture. 4. Asymmetric airways with underlying mass lesion not excluded. Cervical lymphadenopathy. Please see separately dictated CT neck 10/03/2022. Electronically Signed   By: Iven Finn M.D.   On: 10/03/2022 20:09   DG Chest 2 View  Result Date: 10/03/2022 CLINICAL DATA:  URI. Nasal congestion, cough, and fatigue for 2 weeks. EXAM: CHEST - 2 VIEW COMPARISON:  Chest radiographs 09/16/2022 FINDINGS: The cardiomediastinal silhouette is unchanged with normal heart size. There is unchanged mild scarring in the left lung base. No acute airspace consolidation, edema, pleural effusion, or pneumothorax is identified. No acute osseous abnormality is seen. IMPRESSION: No active cardiopulmonary disease. Electronically Signed   By: Logan Bores M.D.   On: 10/03/2022 15:04   DG Chest 2 View  Result Date: 09/16/2022 CLINICAL DATA:  Shortness of breath. EXAM: CHEST - 2 VIEW COMPARISON:  09/08/2022 and prior studies FINDINGS: The cardiomediastinal silhouette is unremarkable. Mild peribronchial thickening is unchanged. There is no  evidence of focal airspace disease, pulmonary edema, suspicious pulmonary nodule/mass, pleural effusion, or pneumothorax. No acute bony abnormalities are identified. IMPRESSION: No active cardiopulmonary disease. Electronically Signed   By: Margarette Canada M.D.   On: 09/16/2022 13:26   DG  Chest Portable 1 View  Result Date: 09/08/2022 CLINICAL DATA:  Syncope. EXAM: PORTABLE CHEST 1 VIEW COMPARISON:  Chest x-ray dated December 14, 2021. FINDINGS: The heart size and mediastinal contours are within normal limits. Minimal linear scarring at the left lung base. No focal consolidation, pleural effusion, or pneumothorax. The visualized skeletal structures are unremarkable. IMPRESSION: No active disease. Electronically Signed   By: Titus Dubin M.D.   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 PET CT scan 12/29/2021  1. Enlarged hypermetabolic cervical, bilateral axillary, retroperitoneal, pelvic, and inguinal lymph nodes. [place] 4. 2. Subcentimeter mediastinal lymph nodes with uptake similar to mediastinal blood pool. 3. Focal hypermetabolic uptake seen at the head of the right clavicle, concerning for osseous involvement. 4. Splenomegaly. 5. Increased tonsillar soft tissue, right greater than left, concerning for lymphomatous involvement.  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  3) moderate to severe protein calorie  malnutrition-much improved patient is gaining weight and has been eating much better. . Wt Readings from Last 3 Encounters:  09/08/22 195 lb (88.5 kg)  06/03/22 195 lb (88.5 kg)  05/31/22 200 lb (90.7 kg)     4) acute renal injury-likely due poor p.o. intake.  Due to spontaneous tumor lysis.  Also could be due to his hematuria and some element of urinary obstruction.  Now resolved  5) massive splenomegaly due to mantle cell lymphoma.  Now resolved on repeat imaging-  6) status post tumor lysis syndrome  PLAN: -Lab results CT imaging discussed in details.  Patient was examined in detail and has recurrent generalized lymphadenopathy in the cervical area axillary area and inguinal area concerning for relapsed mantle cell lymphoma. -His last PET CT scan on 05/25/2022 had showed complete metabolic response and recurrence within 6 months is concerning for aggressive disease. -He is still having upper respiratory infection symptoms and we will send him doxycycline. -Will start him on steroids due to significant cervical lymphadenopathy causing difficulty with swallowing.  No stridor.  No shortness of breath. -Guided core needle biopsy of one of the cervical lymph nodes has been ordered to confirm the diagnosis. -High dose of steroid and start antibiotics.  -Advised the patient going to the ED if he has trouble breathing due to the enlarged lymph node near his neck.  -Schedule repeat PET scan.   FOLLOW-UP: Ultrasound-guided core needle biopsy of his cervical lymph node urgently PET CT scan Return to clinic with Dr. Irene Limbo in 2 weeks with repeat labs  The total time spent in the appointment was 40 minutes* .  All of the patient's questions were answered with apparent satisfaction. The patient knows to call the clinic with any problems, questions or concerns.   Sullivan Lone MD MS AAHIVMS [facility] Hematology/Oncology Physician [facility]  .*Total Encounter Time as defined by the  Centers for Medicare and Medicaid Services includes, in addition to the face-to-face time of a patient visit (documented in the note above) non-face-to-face time: obtaining and reviewing outside history, ordering and reviewing medications, tests  or procedures, care coordination (communications with other health care professionals or caregivers) and documentation in the medical record.   I,Jerry Tran,acting as a Education administrator for Sullivan Lone, MD.

## 2022-10-05 NOTE — Progress Notes (Signed)
Suttle, Rosanne Ashing, MD  Donita Brooks D Approved for ultrasound guided cervical lymph node biopsy.    D[place]

## 2022-10-06 ENCOUNTER — Other Ambulatory Visit: Payer: Self-pay

## 2022-10-08 ENCOUNTER — Other Ambulatory Visit: Payer: Self-pay

## 2022-10-09 ENCOUNTER — Ambulatory Visit
Admission: RE | Admit: 2022-10-09 | Discharge: 2022-10-09 | Disposition: A | Discharge status: Home / Self Care | Payer: Medicare Other | Source: Ambulatory Visit | Attending: Hematology | Admitting: Hematology

## 2022-10-09 DIAGNOSIS — J988 Other specified respiratory disorders: Secondary | ICD-10-CM | POA: Insufficient documentation

## 2022-10-09 DIAGNOSIS — K7689 Other specified diseases of liver: Secondary | ICD-10-CM | POA: Diagnosis not present

## 2022-10-09 DIAGNOSIS — R590 Localized enlarged lymph nodes: Secondary | ICD-10-CM | POA: Insufficient documentation

## 2022-10-09 DIAGNOSIS — C8318 Mantle cell lymphoma, lymph nodes of multiple sites: Secondary | ICD-10-CM | POA: Insufficient documentation

## 2022-10-09 DIAGNOSIS — Z8546 Personal history of malignant neoplasm of prostate: Secondary | ICD-10-CM | POA: Insufficient documentation

## 2022-10-09 DIAGNOSIS — C8310 Mantle cell lymphoma, unspecified site: Secondary | ICD-10-CM | POA: Diagnosis present

## 2022-10-09 LAB — GLUCOSE, CAPILLARY: Glucose-Capillary: 73 mg/dL (ref 70–99)

## 2022-10-09 MED ORDER — FLUDEOXYGLUCOSE F - 18 (FDG) INJECTION
11.2400 | Freq: Once | INTRAVENOUS | Status: AC | PRN
  Administered 2022-10-09: 11.24 via INTRAVENOUS

## 2022-10-11 ENCOUNTER — Other Ambulatory Visit: Payer: Self-pay

## 2022-10-11 ENCOUNTER — Encounter: Payer: Self-pay | Admitting: Hematology

## 2022-10-12 ENCOUNTER — Other Ambulatory Visit: Payer: Self-pay

## 2022-10-13 ENCOUNTER — Other Ambulatory Visit: Payer: Self-pay | Admitting: Internal Medicine

## 2022-10-13 DIAGNOSIS — R591 Generalized enlarged lymph nodes: Secondary | ICD-10-CM

## 2022-10-16 ENCOUNTER — Other Ambulatory Visit: Payer: Self-pay

## 2022-10-16 ENCOUNTER — Encounter (HOSPITAL_COMMUNITY): Payer: Self-pay

## 2022-10-16 ENCOUNTER — Ambulatory Visit (HOSPITAL_COMMUNITY)
Admission: RE | Admit: 2022-10-16 | Discharge: 2022-10-16 | Disposition: A | Discharge status: Home / Self Care | Payer: Medicare Other | Source: Ambulatory Visit | Attending: Hematology | Admitting: Hematology

## 2022-10-16 DIAGNOSIS — R591 Generalized enlarged lymph nodes: Secondary | ICD-10-CM | POA: Insufficient documentation

## 2022-10-16 DIAGNOSIS — K219 Gastro-esophageal reflux disease without esophagitis: Secondary | ICD-10-CM | POA: Diagnosis not present

## 2022-10-16 DIAGNOSIS — I480 Paroxysmal atrial fibrillation: Secondary | ICD-10-CM | POA: Diagnosis not present

## 2022-10-16 DIAGNOSIS — Z8546 Personal history of malignant neoplasm of prostate: Secondary | ICD-10-CM | POA: Diagnosis not present

## 2022-10-16 DIAGNOSIS — E785 Hyperlipidemia, unspecified: Secondary | ICD-10-CM | POA: Insufficient documentation

## 2022-10-16 DIAGNOSIS — I129 Hypertensive chronic kidney disease with stage 1 through stage 4 chronic kidney disease, or unspecified chronic kidney disease: Secondary | ICD-10-CM | POA: Diagnosis not present

## 2022-10-16 DIAGNOSIS — Z923 Personal history of irradiation: Secondary | ICD-10-CM | POA: Insufficient documentation

## 2022-10-16 DIAGNOSIS — C8318 Mantle cell lymphoma, lymph nodes of multiple sites: Secondary | ICD-10-CM | POA: Insufficient documentation

## 2022-10-16 DIAGNOSIS — N1832 Chronic kidney disease, stage 3b: Secondary | ICD-10-CM | POA: Insufficient documentation

## 2022-10-16 LAB — CBC
HCT: 29.8 % — ABNORMAL LOW (ref 39.0–52.0)
Hemoglobin: 9.2 g/dL — ABNORMAL LOW (ref 13.0–17.0)
MCH: 26.7 pg (ref 26.0–34.0)
MCHC: 30.9 g/dL (ref 30.0–36.0)
MCV: 86.6 fL (ref 80.0–100.0)
Platelets: 143 10*3/uL — ABNORMAL LOW (ref 150–400)
RBC: 3.44 MIL/uL — ABNORMAL LOW (ref 4.22–5.81)
RDW: 15.3 % (ref 11.5–15.5)
WBC: 4.2 10*3/uL (ref 4.0–10.5)
nRBC: 0 % (ref 0.0–0.2)

## 2022-10-16 LAB — PROTIME-INR
INR: 1.1 (ref 0.8–1.2)
Prothrombin Time: 13.9 seconds (ref 11.4–15.2)

## 2022-10-16 MED ORDER — MIDAZOLAM HCL 2 MG/2ML IJ SOLN
INTRAMUSCULAR | Status: AC
  Filled 2022-10-16: qty 2

## 2022-10-16 MED ORDER — SODIUM CHLORIDE 0.9 % IV SOLN: INTRAVENOUS | Status: DC

## 2022-10-16 MED ORDER — MIDAZOLAM HCL 2 MG/2ML IJ SOLN
INTRAMUSCULAR | Status: AC | PRN
  Administered 2022-10-16 (×2): 1 mg via INTRAVENOUS

## 2022-10-16 MED ORDER — FENTANYL CITRATE (PF) 100 MCG/2ML IJ SOLN
INTRAMUSCULAR | Status: AC
  Filled 2022-10-16: qty 2

## 2022-10-16 MED ORDER — FENTANYL CITRATE (PF) 100 MCG/2ML IJ SOLN
INTRAMUSCULAR | Status: AC | PRN
  Administered 2022-10-16 (×2): 50 ug via INTRAVENOUS

## 2022-10-16 MED ORDER — LIDOCAINE HCL 1 % IJ SOLN
INTRAMUSCULAR | Status: AC
  Administered 2022-10-16: 10 mL
  Filled 2022-10-16: qty 20

## 2022-10-16 NOTE — Procedures (Signed)
Interventional Radiology Procedure Note  Procedure: US guided biopsy of left inguinal lymph node Complications: None EBL: None Recommendations: - Bedrest 1 hours.   - Routine wound care - Follow up pathology - Advance diet   Signed,  Corrie Mckusick, DO

## 2022-10-16 NOTE — Consult Note (Signed)
Chief Complaint: Patient was seen in consultation today for lymphadenopathy at the request of Brunetta Genera  Referring Physician(s): Brunetta Genera  Supervising Physician: Corrie Mckusick  Patient Status: [facility] - Out-pt  History of Present Illness: Jerry Tran is a 76 y.o. 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 He is referred to IR for core biopsy of lymph node.  Case reviewed by Dr. Earleen Newport who is agreeable to proceed with core biopsy of inguinal lymph node.  Patient is accompanied by wife today who is available to drive patient home and after care.  Past Medical History:  Diagnosis Date   Arthritis    Chronic kidney disease, stage 3b ([place]) 12/09/2021   Dyslipidemia    GERD (gastroesophageal reflux disease)    Hypertension    PAF (paroxysmal atrial fibrillation) (HCC)    Prostate cancer ([place]) 03/2021   s/p radiation therapy   PTSD (post-traumatic stress disorder)     Past Surgical History:  Procedure Laterality Date   LIPOMA EXCISION Right 12/09/2021   Procedure: RIGHT SUPERCLAVICULAR LYMPH NODE EXCISION;  Surgeon: Ileana Roup, MD;  Location: [place];  Service: General;  Laterality: Right;    Allergies: Sildenafil  Medications: Prior to Admission medications   Medication Sig Start Date End Date Taking? Authorizing Provider  acyclovir (ZOVIRAX) 400 MG tablet TAKE 1 TABLET(400 MG) BY MOUTH DAILY 09/04/22  Yes Brunetta Genera, MD  atorvastatin (LIPITOR) 20 MG tablet Take 10 mg by mouth at bedtime.   Yes [provider]  Cholecalciferol (VITAMIN D3 PO) Take 1 tablet by mouth every morning.   Yes [provider]  diclofenac Sodium (VOLTAREN) 1 % GEL Apply 2 g topically 4 (four) times daily as needed (pain).   Yes [provider]  feeding supplement  (ENSURE ENLIVE / ENSURE PLUS) LIQD Take 237 mLs by mouth 2 (two) times daily between meals. 12/11/21  Yes Regalado, Belkys A, MD  flecainide (TAMBOCOR) 100 MG tablet Take 100 mg by mouth every 12 (twelve) hours.   Yes [provider]  fluticasone (FLONASE) 50 MCG/ACT nasal spray Place 1 spray into both nostrils daily as needed for allergies or rhinitis.   Yes [provider]  hydroxypropyl methylcellulose / hypromellose (ISOPTO TEARS / GONIOVISC) 2.5 % ophthalmic solution Place 2 drops into both eyes 2 (two) times daily as needed for dry eyes.   Yes [provider]  LORazepam (ATIVAN) 0.5 MG tablet Take 1 tablet (0.5 mg total) by mouth every 6 (six) hours as needed (Nausea or vomiting). 12/16/21  Yes Brunetta Genera, MD  metoprolol tartrate (LOPRESSOR) 25 MG tablet Take 12.5 mg by mouth 2 (two) times daily. Take with flecainide   Yes [provider]  mirtazapine (REMERON) 30 MG tablet Take 30 mg by mouth at bedtime. 01/31/22  Yes [provider]  omeprazole (PRILOSEC) 20 MG capsule Take 20 mg by mouth daily before breakfast.   Yes [provider]  potassium chloride SA (KLOR-CON M) 20 MEQ tablet Take 1 tablet (20 mEq total) by mouth 2 (two) times daily. 04/25/22  Yes Brunetta Genera, MD  prazosin (MINIPRESS) 2 MG capsule Take 6 mg by mouth at bedtime.   Yes [provider]  torsemide (DEMADEX) 10 MG tablet Take 20 mg by mouth daily.   Yes [provider]  triamcinolone cream (  KENALOG) 0.1 % Apply 1 application topically 2 (two) times daily as needed (rash).   Yes [provider]  ammonium lactate (LAC-HYDRIN) 12 % lotion Apply 1 application topically 2 (two) times daily as needed for dry skin.    [provider]  ondansetron (ZOFRAN) 8 MG tablet Take 1 tablet (8 mg total) by mouth 2 (two) times daily as needed for refractory nausea / vomiting. Start on day 2 after bendamustine chemo. 12/16/21   Brunetta Genera, MD  prochlorperazine (COMPAZINE) 10 MG tablet Take 1 tablet (10 mg total) by mouth every 6 (six) hours as needed (Nausea or vomiting). 12/16/21   Brunetta Genera, MD  terbinafine (LAMISIL) 1 % cream Apply 1 application topically 2 (two) times daily as needed (athlete's foot).    [provider]     Family History  Problem Relation Age of Onset   Cancer Neg Hx    Review of Systems  Constitutional: Negative.   HENT:  Positive for congestion, postnasal drip and trouble swallowing.   Eyes: Negative.   Respiratory: Negative.    Cardiovascular: Negative.   Gastrointestinal: Negative.   Endocrine: Negative.   Genitourinary: Negative.   Musculoskeletal: Negative.   Skin: Negative.   Neurological: Negative.   Hematological: Negative.   Psychiatric/Behavioral: Negative.      Vital Signs: BP 107/74   Pulse 70   Temp 98.1 F (36.7 C) (Oral)   Resp 18   Ht '6\' 4"'$  (1.93 m)   Wt 202 lb 2.6 oz (91.7 kg)   SpO2 99%   BMI 24.61 kg/m   Physical Exam Constitutional:      General: He is not in acute distress.    Appearance: He is not toxic-appearing.  HENT:     Head: Normocephalic and atraumatic.     Mouth/Throat:     Mouth: Mucous membranes are moist.     Pharynx: Oropharynx is clear.  Cardiovascular:     Rate and Rhythm: Normal rate.     Pulses: Normal pulses.  Pulmonary:     Effort: Pulmonary effort is normal. No respiratory distress.     Breath sounds: Normal breath sounds.  Abdominal:     General: Abdomen is flat.     Palpations: Abdomen is soft.  Lymphadenopathy:     Cervical: Cervical adenopathy present.  Skin:    General: Skin is warm and dry.  Neurological:     General: No focal deficit present.     Mental Status: He is alert and oriented to person, place, and time.  Psychiatric:        Mood and Affect: Mood normal.        Behavior: Behavior normal.     Imaging: NM PET Image Restag (PS) Skull Base To Thigh  Result Date:  10/10/2022 CLINICAL DATA:  Subsequent treatment strategy for mantle cell lymphoma. History of prostate cancer. EXAM: NUCLEAR MEDICINE PET SKULL BASE TO THIGH TECHNIQUE: 11.24 mCi F-18 FDG was injected intravenously. Full-ring PET imaging was performed from the skull base to thigh after the radiotracer. CT data was obtained and used for attenuation correction and anatomic localization. Fasting blood glucose: 73 mg/dl COMPARISON:  PET-CT 05/25/2022 and 03/13/2022. FINDINGS: Mediastinal blood pool activity: SUV max 1.7 Liver activity: SUV max 2.2 NECK: There is extensive recurrent hypermetabolic cervical lymphadenopathy bilaterally. Confluent lymph nodes extend from the skull base into both supraclavicular regions bilaterally, demonstrating an SUV max of up to 6.8 on the right. There is extensive hypermetabolic activity within the  lymphoid tissue of Waldeyer's ring, demonstrating an SUV max of 10.0.The nasopharyngeal airway appears obstructed. Incidental CT findings: none CHEST: Multiple hypermetabolic supraclavicular, subpectoral and axillary lymph nodes are present bilaterally. Moderately enlarged adjacent left axillary lymph nodes measuring up to 1.8 cm short axis on image 71/2 have an SUV max of 6.7. There are no enlarged or hypermetabolic mediastinal or hilar lymph nodes. No hypermetabolic pulmonary activity or suspicious nodularity. Incidental CT findings: Minimal left lung atelectasis and aortic atherosclerosis. ABDOMEN/PELVIS: There is no hypermetabolic activity within the liver, adrenal glands, spleen or pancreas. There are multiple enlarged and hypermetabolic lymph nodes in the lower retroperitoneum, pelvis and both inguinal regions. Dominant left external iliac node measuring 4.4 x 3.2 cm on image 202/2 has an SUV max of 5.8. 4.0 x 3.2 cm right inguinal node on image 237/2 has an SUV max of 5.4. A left inguinal node measuring 3.4 x 3.4 cm on image 231/2 has an SUV max of 5.8. Incidental CT findings: Grossly  stable scattered small hepatic cysts. No evidence of bowel or ureteral obstruction. Mild distal colonic diverticulosis. SKELETON: There is no hypermetabolic activity to suggest osseous metastatic disease. There is a hypermetabolic soft tissue nodule medial to the left areola, measuring 2.4 cm on image 98/2 (SUV max 11.9). Multifocal hypermetabolic activity is present within the left upper arm, not typical for activity related to the injection and suspicious for soft tissue metastases. Incidental CT findings: Relatively mild multilevel spondylosis. No evidence of intraspinal tumor or pathologic fracture. IMPRESSION: 1. Extensive recurrent hypermetabolic lymphadenopathy throughout the neck, chest, abdomen and pelvis consistent with recurrent lymphoma ([place] [facility]). 2. Hypermetabolic soft tissue nodule medial to the left areola consistent with a soft tissue metastasis. Possible intramuscular lesions in the left upper arm. 3. Prominent involvement of the lymphoid tissue of Waldeyer's ring with resulting nasopharyngeal airway obstruction. 4. No evidence of solid organ or osseous involvement. 5. These results will be called to the ordering clinician or representative by the Radiologist Assistant, and communication documented in the PACS or [employer]. Electronically Signed   By: Richardean Sale M.D.   On: 10/10/2022 14:36   CT Soft Tissue Neck W Contrast  Result Date: 10/03/2022 CLINICAL DATA:  Initial evaluation for sore throat, history of lymphoma. EXAM: CT NECK WITH CONTRAST TECHNIQUE: Multidetector CT imaging of the neck was performed using the standard protocol following the bolus administration of intravenous contrast. RADIATION DOSE REDUCTION: This exam was performed according to the departmental dose-optimization program which includes automated exposure control, adjustment of the mA and/or kV according to patient size and/or use of iterative reconstruction technique. CONTRAST:  19m OMNIPAQUE IOHEXOL  350 MG/ML SOLN COMPARISON:  None Available. FINDINGS: Pharynx and larynx: Oral cavity within normal limits. No acute inflammatory changes seen about the dentition. Irregular bulky enlargement of the lymphoid tissues including the palatine tonsils and adenoidal soft tissues is seen, slightly worse on the right. Changes are somewhat nodular in appearance (series 3, image 72 along the right epiglottis and vallecula for example). Secondary partial effacement of the nasopharyngeal and oropharyngeal airway. Parapharyngeal fat is maintained. No retropharyngeal collection or swelling. Epiglottis itself is within normal limits. Remainder of the hypopharynx and supraglottic larynx within normal limits. Negative glottis. Subglottic airway patent clear. Salivary glands: Salivary glands including the parotid and submandibular glands are within normal limits. Thyroid: Normal. Lymph nodes: Abnormal bulky enlarged adenopathy seen extending along the cervical chains bilaterally, right worse than left. Largest node/nodal conglomerate on the right seen at level II a  and measures 2.7 x 3.5 cm (series 3, image 68). Largest discrete node on the left seen at level II A as well and measures 1.6 x 1.9 cm (series 3, 69). Associated posterior cervical chain on the right, with right greater than left supraclavicular adenopathy. Prominent an enlarged bilateral axillary adenopathy is seen, worse on the left. Largest axillary node on the left measures 2.0 x 2.3 cm (series 3, image 122). Findings presumably related to provided history of lymphoma. Vascular: Normal intravascular enhancement seen throughout the neck. Limited intracranial: Unremarkable. Visualized orbits: Prior bilateral ocular lens replacement. Otherwise unremarkable. Mastoids and visualized paranasal sinuses: Mild mucoperiosteal thickening present about the right maxillary sinus. Visualized paranasal sinuses are otherwise largely clear. Mastoid air cells and middle ear cavities are  well pneumatized and free of fluid. Skeleton: No discrete or worrisome osseous lesions. Moderate to advance spondylosis present at C4-5 through C6-7. Upper chest: Visualized upper chest demonstrates no other acute finding. Other: None. IMPRESSION: 1. Irregular bulky enlargement of the lymphoid tissues including the palatine tonsils and adenoidal soft tissues, right worse than left. Findings presumably related to provided history of lymphoma, although a primary head neck malignancy would be the primary differential consideration. Correlation with direct visualization recommended. 2. Abnormal enlarged bilateral cervical, supraclavicular, and axillary adenopathy, also presumably related to history of lymphoma. 3. No other acute abnormality within the neck. 4. Moderate to advance spondylosis at C4-5 through C6-7. Electronically Signed   By: Jeannine Boga M.D.   On: 10/03/2022 20:27   CT HEAD WO CONTRAST (5MM)  Result Date: 10/03/2022 CLINICAL DATA:  Bone mass or bone pain, skull, no prior imaging headache, right parietal soft tissue mass?; sinusitis EXAM: CT HEAD WITHOUT CONTRAST CT MAXILLOFACIAL WITHOUT CONTRAST TECHNIQUE: Multidetector CT imaging of the head and maxillofacial structures were performed using the standard protocol without intravenous contrast. Multiplanar CT image reconstructions of the maxillofacial structures were also generated. RADIATION DOSE REDUCTION: This exam was performed according to the departmental dose-optimization program which includes automated exposure control, adjustment of the mA and/or kV according to patient size and/or use of iterative reconstruction technique. COMPARISON:  CT head 06/03/2022. FINDINGS: CT HEAD FINDINGS Brain: No evidence of large-territorial acute infarction. No parenchymal hemorrhage. No mass lesion. Right calvarial convexity 3 mm extra-axial fluid collection measuring slightly lower in density than gray matter. No mass effect or midline shift. No  hydrocephalus. Basilar cisterns are patent. Vascular: No hyperdense vessel. Skull: No acute fracture or focal lesion. Other: There is a 1.6 x 1.2 cm right parietal scalp soft tissue dense. Associated punctate dermal calcification nonspecific. CT MAXILLOFACIAL FINDINGS Osseous: No fracture or mandibular dislocation. No destructive process. Sinuses/Orbits: Frothy secretions within the nasal cavities. Right maxillary sinus mucosal thickening. Paranasal sinuses and mastoid air cells are clear. Bilateral lens replacement. Otherwise the orbits are unremarkable. Soft tissues: Negative. Other: Visualized upper cervical spine demonstrates severe degenerative changes. Asymmetric airways (3:33). Cervical lymphadenopathy. IMPRESSION: 1. Likely subacute 3 mm right subdural hematoma. No definite acute component. 2. Indeterminate 1.6 x 1.2 cm right parietal scalp soft tissue dense. Finding may represent a sebaceous cyst. Correlate clinically. 3. No acute displaced facial fracture. 4. Asymmetric airways with underlying mass lesion not excluded. Cervical lymphadenopathy. Please see separately dictated CT neck 10/03/2022. Electronically Signed   By: Iven Finn M.D.   On: 10/03/2022 20:09   CT Maxillofacial W Contrast  Result Date: 10/03/2022 CLINICAL DATA:  Bone mass or bone pain, skull, no prior imaging headache, right parietal soft tissue mass?; sinusitis EXAM:  CT HEAD WITHOUT CONTRAST CT MAXILLOFACIAL WITHOUT CONTRAST TECHNIQUE: Multidetector CT imaging of the head and maxillofacial structures were performed using the standard protocol without intravenous contrast. Multiplanar CT image reconstructions of the maxillofacial structures were also generated. RADIATION DOSE REDUCTION: This exam was performed according to the departmental dose-optimization program which includes automated exposure control, adjustment of the mA and/or kV according to patient size and/or use of iterative reconstruction technique. COMPARISON:  CT  head 06/03/2022. FINDINGS: CT HEAD FINDINGS Brain: No evidence of large-territorial acute infarction. No parenchymal hemorrhage. No mass lesion. Right calvarial convexity 3 mm extra-axial fluid collection measuring slightly lower in density than gray matter. No mass effect or midline shift. No hydrocephalus. Basilar cisterns are patent. Vascular: No hyperdense vessel. Skull: No acute fracture or focal lesion. Other: There is a 1.6 x 1.2 cm right parietal scalp soft tissue dense. Associated punctate dermal calcification nonspecific. CT MAXILLOFACIAL FINDINGS Osseous: No fracture or mandibular dislocation. No destructive process. Sinuses/Orbits: Frothy secretions within the nasal cavities. Right maxillary sinus mucosal thickening. Paranasal sinuses and mastoid air cells are clear. Bilateral lens replacement. Otherwise the orbits are unremarkable. Soft tissues: Negative. Other: Visualized upper cervical spine demonstrates severe degenerative changes. Asymmetric airways (3:33). Cervical lymphadenopathy. IMPRESSION: 1. Likely subacute 3 mm right subdural hematoma. No definite acute component. 2. Indeterminate 1.6 x 1.2 cm right parietal scalp soft tissue dense. Finding may represent a sebaceous cyst. Correlate clinically. 3. No acute displaced facial fracture. 4. Asymmetric airways with underlying mass lesion not excluded. Cervical lymphadenopathy. Please see separately dictated CT neck 10/03/2022. Electronically Signed   By: Iven Finn M.D.   On: 10/03/2022 20:09   DG Chest 2 View  Result Date: 10/03/2022 CLINICAL DATA:  URI. Nasal congestion, cough, and fatigue for 2 weeks. EXAM: CHEST - 2 VIEW COMPARISON:  Chest radiographs 09/16/2022 FINDINGS: The cardiomediastinal silhouette is unchanged with normal heart size. There is unchanged mild scarring in the left lung base. No acute airspace consolidation, edema, pleural effusion, or pneumothorax is identified. No acute osseous abnormality is seen. IMPRESSION: No  active cardiopulmonary disease. Electronically Signed   By: Logan Bores M.D.   On: 10/03/2022 15:04   DG Chest 2 View  Result Date: 09/16/2022 CLINICAL DATA:  Shortness of breath. EXAM: CHEST - 2 VIEW COMPARISON:  09/08/2022 and prior studies FINDINGS: The cardiomediastinal silhouette is unremarkable. Mild peribronchial thickening is unchanged. There is no evidence of focal airspace disease, pulmonary edema, suspicious pulmonary nodule/mass, pleural effusion, or pneumothorax. No acute bony abnormalities are identified. IMPRESSION: No active cardiopulmonary disease. Electronically Signed   By: Margarette Canada M.D.   On: 09/16/2022 13:26    Labs:  CBC: Recent Labs    06/03/22 1309 09/08/22 1014 10/03/22 1638 10/16/22 1130  WBC 2.3* 3.3* 4.3 4.2  HGB 11.3* 10.7* 9.5* 9.2*  HCT 38.4* 34.3* 29.7* 29.8*  PLT 149* 182 140* 143*    COAGS: Recent Labs    10/16/22 1130  INR 1.1    BMP: Recent Labs    05/25/22 1430 06/03/22 1309 09/08/22 1014 10/03/22 1638  NA 143 138 139 139  K 3.1* 4.0 3.7 3.5  CL 109 105 107 108  CO2 '28 25 25 23  '$ GLUCOSE 90 89 106* 86  BUN 20 27* 27* 21  CALCIUM 9.6 9.5 9.7 9.3  CREATININE 1.03 1.05 0.94 1.04  GFRNONAA >60 >60 >60 >60    LIVER FUNCTION TESTS: Recent Labs    04/25/22 1005 05/25/22 1430 09/08/22 1014 10/03/22 1638  BILITOT 0.3  0.4 0.5 0.5  AST '17 17 19 18  '$ ALT '10 9 13 13  '$ ALKPHOS 60 62 71 64  PROT 7.5 7.4 8.2* 7.2  ALBUMIN 4.0 3.9 3.9 3.3*    TUMOR MARKERS: No results for input(s): "AFPTM", "CEA", "CA199", "CHROMGRNA" in the last 8760 hours.  Assessment and Plan:  Lymphadenopathy In the setting of recent history of mantle cell lymphoma with concern for recurrence. Case reviewed and approved for core biopsy of inguinal lymph node by Dr. Earleen Newport Patient is appropriately NPO and not on thinners Labs, imaging, vitals, and history reviewed. Will proceed with planned biopsy and anticipated discharge this afternoon.  Risks and  benefits of inguinal node core biopsy was discussed with the patient and/or patient's family including, but not limited to bleeding, infection, damage to adjacent structures or low yield requiring additional tests.  All of the questions were answered and there is agreement to proceed.  Consent signed and in chart.   Thank you for this interesting consult.  I greatly enjoyed meeting Ibrahim Mcpheeters and look forward to participating in their care.  A copy of this report was sent to the requesting provider on this date.  Electronically Signed: Pasty Spillers, PA 10/16/2022, 12:49 PM   I spent a total of 30 Minutes  in face to face in clinical consultation, greater than 50% of which was counseling/coordinating care for lymphadenopathy

## 2022-10-16 NOTE — Discharge Instructions (Signed)
Discharge Instructions:   Please call Interventional Radiology clinic [phone] with any questions or concerns.  You may remove your dressing  from the  left inguinal area and shower tomorrow.   Moderate Conscious Sedation, Adult, Care After This sheet gives you information about how to care for yourself after your procedure. Your health care provider may also give you more specific instructions. If you have problems or questions, contact your health care provider. What can I expect after the procedure? After the procedure, it is common to have: Sleepiness for several hours. Impaired judgment for several hours. Difficulty with balance. Vomiting if you eat too soon. Follow these instructions at home: For the time period you were told by your health care provider: Rest. Do not participate in activities where you could fall or become injured. Do not drive or use machinery. Do not drink alcohol. Do not take sleeping pills or medicines that cause drowsiness. Do not make important decisions or sign legal documents. Do not take care of children on your own. Eating and drinking  Follow the diet recommended by your health care provider. Drink enough fluid to keep your urine pale yellow. If you vomit: Drink water, juice, or soup when you can drink without vomiting. Make sure you have little or no nausea before eating solid foods. General instructions Take over-the-counter and prescription medicines only as told by your health care provider. Have a responsible adult stay with you for the time you are told. It is important to have someone help care for you until you are awake and alert. Do not smoke. Keep all follow-up visits as told by your health care provider. This is important. Contact a health care provider if: You are still sleepy or having trouble with balance after 24 hours. You feel light-headed. You keep feeling nauseous or you keep vomiting. You develop a rash. You have a  fever. You have redness or swelling around the IV site. Get help right away if: You have trouble breathing. You have new-onset confusion at home. Summary After the procedure, it is common to feel sleepy, have impaired judgment, or feel nauseous if you eat too soon. Rest after you get home. Know the things you should not do after the procedure. Follow the diet recommended by your health care provider and drink enough fluid to keep your urine pale yellow. Get help right away if you have trouble breathing or new-onset confusion at home. This information is not intended to replace advice given to you by your health care provider. Make sure you discuss any questions you have with your health care provider. Document Revised: 02/13/2020 Document Reviewed: 09/11/2019 Elsevier Patient Education  [place].   Needle Biopsy, Care After The following information offers guidance on how to care for yourself after your procedure. Your health care provider may also give you more specific instructions. If you have problems or questions, contact your health care provider. What can I expect after the procedure? After the procedure, it is common to have: Soreness, pain, and tenderness where a tissue sample was taken (biopsy site). Bruising or mild pain at the biopsy site. These symptoms should go away after a few days. Follow these instructions at home: Biopsy site care  Follow instructions from your health care provider about how to take care of your biopsy site. Make sure you: Wash your hands with soap and water for at least 20 seconds before and after you change your bandage (dressing). If soap and water are not available, use  hand sanitizer. Know when and how to change your dressing. Know when to remove your dressing. Check your puncture site every day for signs of infection. Check for: More redness, swelling, or pain. More drainage of fluid or blood. More warmth. Pus or a bad  smell. General instructions Rest as told by your health care provider. Do not take baths, swim, or use a hot tub until your health care provider approves. Ask your health care provider if you may take showers. You may only be allowed to take sponge baths. Take over-the-counter and prescription medicines only as told by your health care provider. Return to your normal activities as told by your health care provider. Ask your health care provider what activities are safe for you. If you have airplane travel scheduled, talk with your health care provider about when it is safe for you to travel by airplane. This is specific to certain biopsy procedures. It is up to you to get the results of your procedure. Ask your health care provider, or the department that is doing the procedure, when your results will be ready. Keep all follow-up visits. You may need to make an appointment to get your biopsy results. Contact a health care provider if: You have a fever. You have more redness, swelling, or pain at the puncture site that lasts longer than a few days. You have more fluid or blood coming from your puncture site. You have pus or a bad smell coming from your puncture site. Your puncture site feels warm to the touch. You have pain that does not get better with medicine. Get help right away if: You have severe bleeding from the puncture site. You have chest pain. You have problems breathing. You cough up blood. You faint. You have a very fast heart rate. These symptoms may be an emergency. Get help right away. Call 911. Do not wait to see if the symptoms will go away. Do not drive yourself to the hospital. Summary After the procedure, it is common to have soreness, bruising, tenderness, or mild pain at the biopsy site. These symptoms should go away in a few days. Check your biopsy site every day for signs of infection, such as more redness, swelling, or pain. Do not take baths, swim, or use a hot  tub until your health care provider approves. Ask your health care provider if you may take showers. Contact a heath care provider if you have more redness, swelling, or pain at the puncture site that lasts longer than a few days. This information is not intended to replace advice given to you by your health care provider. Make sure you discuss any questions you have with your health care provider. Document Revised: 10/12/2021 Document Reviewed: 10/12/2021 Elsevier Patient Education  [place].

## 2022-10-17 ENCOUNTER — Other Ambulatory Visit: Payer: Self-pay | Admitting: Hematology

## 2022-10-17 DIAGNOSIS — C8318 Mantle cell lymphoma, lymph nodes of multiple sites: Secondary | ICD-10-CM

## 2022-10-17 DIAGNOSIS — Z7189 Other specified counseling: Secondary | ICD-10-CM

## 2022-10-17 MED ORDER — ALLOPURINOL 100 MG PO TABS: 100.0000 mg | ORAL_TABLET | Freq: Two times a day (BID) | ORAL | 0 refills | Status: DC

## 2022-10-17 MED ORDER — ACYCLOVIR 400 MG PO TABS: 400.0000 mg | ORAL_TABLET | Freq: Every day | ORAL | 3 refills | Status: DC

## 2022-10-17 MED ORDER — PREDNISONE 20 MG PO TABS: 60.0000 mg | ORAL_TABLET | Freq: Every day | ORAL | 5 refills | Status: DC

## 2022-10-17 MED ORDER — LIDOCAINE-PRILOCAINE 2.5-2.5 % EX CREA: TOPICAL_CREAM | CUTANEOUS | 3 refills | Status: DC

## 2022-10-17 MED ORDER — PROCHLORPERAZINE MALEATE 10 MG PO TABS: 10.0000 mg | ORAL_TABLET | Freq: Four times a day (QID) | ORAL | 6 refills | Status: DC | PRN

## 2022-10-17 MED ORDER — ONDANSETRON HCL 8 MG PO TABS: 8.0000 mg | ORAL_TABLET | Freq: Three times a day (TID) | ORAL | 1 refills | Status: DC | PRN

## 2022-10-18 ENCOUNTER — Other Ambulatory Visit: Payer: Self-pay | Admitting: Hematology

## 2022-10-18 DIAGNOSIS — C8318 Mantle cell lymphoma, lymph nodes of multiple sites: Secondary | ICD-10-CM

## 2022-10-18 DIAGNOSIS — Z7189 Other specified counseling: Secondary | ICD-10-CM

## 2022-10-18 LAB — SURGICAL PATHOLOGY

## 2022-10-18 NOTE — Telephone Encounter (Signed)
duplicate

## 2022-10-19 ENCOUNTER — Other Ambulatory Visit: Payer: Self-pay

## 2022-10-19 NOTE — Progress Notes (Signed)
Pharmacist Chemotherapy Monitoring - Initial Assessment    Anticipated start date: 10/27/22   The following has been reviewed per standard work regarding the patient's treatment regimen: The patient's diagnosis, treatment plan and drug doses, and organ/hematologic function Lab orders and baseline tests specific to treatment regimen  The treatment plan start date, drug sequencing, and pre-medications Prior authorization status  Patient's documented medication list, including drug-drug interaction screen and prescriptions for anti-emetics and supportive care specific to the treatment regimen The drug concentrations, fluid compatibility, administration routes, and timing of the medications to be used The patient's access for treatment and lifetime cumulative dose history, if applicable  The patient's medication allergies and previous infusion related reactions, if applicable   Changes made to treatment plan:  N/A  Follow up needed:  ECHO scheduled  10/25/22 Hep B results 11/2021 - MD aware   Jerry Tran, [place], 10/19/2022  12:43 PM

## 2022-10-20 ENCOUNTER — Other Ambulatory Visit: Payer: Self-pay

## 2022-10-25 ENCOUNTER — Ambulatory Visit (HOSPITAL_COMMUNITY)
Admission: RE | Admit: 2022-10-25 | Discharge: 2022-10-25 | Disposition: A | Discharge status: Home / Self Care | Payer: Medicare Other | Source: Ambulatory Visit | Attending: Hematology | Admitting: Hematology

## 2022-10-25 DIAGNOSIS — Z0189 Encounter for other specified special examinations: Secondary | ICD-10-CM

## 2022-10-25 DIAGNOSIS — C8318 Mantle cell lymphoma, lymph nodes of multiple sites: Secondary | ICD-10-CM | POA: Diagnosis not present

## 2022-10-25 DIAGNOSIS — I4891 Unspecified atrial fibrillation: Secondary | ICD-10-CM | POA: Insufficient documentation

## 2022-10-25 DIAGNOSIS — R9431 Abnormal electrocardiogram [ECG] [EKG]: Secondary | ICD-10-CM | POA: Insufficient documentation

## 2022-10-25 DIAGNOSIS — Z5111 Encounter for antineoplastic chemotherapy: Secondary | ICD-10-CM | POA: Insufficient documentation

## 2022-10-25 DIAGNOSIS — I10 Essential (primary) hypertension: Secondary | ICD-10-CM | POA: Insufficient documentation

## 2022-10-25 DIAGNOSIS — E785 Hyperlipidemia, unspecified: Secondary | ICD-10-CM | POA: Insufficient documentation

## 2022-10-25 LAB — ECHOCARDIOGRAM COMPLETE
Area-P 1/2: 2.93 cm2
Calc EF: 68.2 %
S' Lateral: 3.2 cm
Single Plane A2C EF: 73.3 %
Single Plane A4C EF: 61.5 %

## 2022-10-25 NOTE — Progress Notes (Signed)
  Echocardiogram 2D Echocardiogram has been performed.  Jerry Tran 10/25/2022, 12:06 PM

## 2022-10-26 ENCOUNTER — Other Ambulatory Visit: Payer: Self-pay

## 2022-10-26 ENCOUNTER — Inpatient Hospital Stay (HOSPITAL_BASED_OUTPATIENT_CLINIC_OR_DEPARTMENT_OTHER): Payer: Medicare Other | Admitting: Hematology

## 2022-10-26 ENCOUNTER — Inpatient Hospital Stay: Payer: Medicare Other

## 2022-10-26 DIAGNOSIS — Z7189 Other specified counseling: Secondary | ICD-10-CM

## 2022-10-26 DIAGNOSIS — C8318 Mantle cell lymphoma, lymph nodes of multiple sites: Secondary | ICD-10-CM

## 2022-10-26 DIAGNOSIS — Z5112 Encounter for antineoplastic immunotherapy: Secondary | ICD-10-CM | POA: Diagnosis not present

## 2022-10-26 LAB — CBC WITH DIFFERENTIAL (CANCER CENTER ONLY)
Abs Immature Granulocytes: 0.02 10*3/uL (ref 0.00–0.07)
Basophils Absolute: 0 10*3/uL (ref 0.0–0.1)
Basophils Relative: 1 %
Eosinophils Absolute: 0 10*3/uL (ref 0.0–0.5)
Eosinophils Relative: 1 %
HCT: 29.6 % — ABNORMAL LOW (ref 39.0–52.0)
Hemoglobin: 9.4 g/dL — ABNORMAL LOW (ref 13.0–17.0)
Immature Granulocytes: 0 %
Lymphocytes Relative: 18 %
Lymphs Abs: 0.8 10*3/uL (ref 0.7–4.0)
MCH: 26.9 pg (ref 26.0–34.0)
MCHC: 31.8 g/dL (ref 30.0–36.0)
MCV: 84.6 fL (ref 80.0–100.0)
Monocytes Absolute: 0.3 10*3/uL (ref 0.1–1.0)
Monocytes Relative: 6 %
Neutro Abs: 3.3 10*3/uL (ref 1.7–7.7)
Neutrophils Relative %: 74 %
Platelet Count: 174 10*3/uL (ref 150–400)
RBC: 3.5 MIL/uL — ABNORMAL LOW (ref 4.22–5.81)
RDW: 15.6 % — ABNORMAL HIGH (ref 11.5–15.5)
WBC Count: 4.5 10*3/uL (ref 4.0–10.5)
nRBC: 0 % (ref 0.0–0.2)

## 2022-10-26 LAB — CMP (CANCER CENTER ONLY)
ALT: 14 U/L (ref 0–44)
AST: 26 U/L (ref 15–41)
Albumin: 3.7 g/dL (ref 3.5–5.0)
Alkaline Phosphatase: 78 U/L (ref 38–126)
Anion gap: 8 (ref 5–15)
BUN: 19 mg/dL (ref 8–23)
CO2: 28 mmol/L (ref 22–32)
Calcium: 9.6 mg/dL (ref 8.9–10.3)
Chloride: 105 mmol/L (ref 98–111)
Creatinine: 1.09 mg/dL (ref 0.61–1.24)
GFR, Estimated: >60 mL/min (ref >60)
Glucose, Bld: 98 mg/dL (ref 70–99)
Potassium: 3.3 mmol/L — ABNORMAL LOW (ref 3.5–5.1)
Sodium: 141 mmol/L (ref 135–145)
Total Bilirubin: 0.5 mg/dL (ref 0.3–1.2)
Total Protein: 7.4 g/dL (ref 6.5–8.1)

## 2022-10-26 MED FILL — Dexamethasone Sodium Phosphate Inj 100 MG/10ML: INTRAMUSCULAR | Qty: 1 | Status: AC

## 2022-10-26 MED FILL — Fosaprepitant Dimeglumine For IV Infusion 150 MG (Base Eq): INTRAVENOUS | Qty: 5 | Status: AC

## 2022-10-27 ENCOUNTER — Encounter: Payer: Self-pay | Admitting: Hematology

## 2022-10-27 ENCOUNTER — Inpatient Hospital Stay: Payer: Medicare Other

## 2022-10-27 VITALS — BP 144/80 | HR 84 | Temp 97.7°F | Resp 16

## 2022-10-27 DIAGNOSIS — Z7189 Other specified counseling: Secondary | ICD-10-CM

## 2022-10-27 DIAGNOSIS — Z5112 Encounter for antineoplastic immunotherapy: Secondary | ICD-10-CM | POA: Diagnosis not present

## 2022-10-27 DIAGNOSIS — C8318 Mantle cell lymphoma, lymph nodes of multiple sites: Secondary | ICD-10-CM

## 2022-10-27 MED ORDER — ACETAMINOPHEN 325 MG PO TABS
650.0000 mg | ORAL_TABLET | Freq: Once | ORAL | Status: AC
  Administered 2022-10-27: 650 mg via ORAL
  Filled 2022-10-27: qty 2

## 2022-10-27 MED ORDER — LORAZEPAM 2 MG/ML IJ SOLN
0.5000 mg | Freq: Once | INTRAMUSCULAR | Status: AC
  Administered 2022-10-27: 0.5 mg via INTRAVENOUS
  Filled 2022-10-27: qty 1

## 2022-10-27 MED ORDER — DIPHENHYDRAMINE HCL 25 MG PO CAPS
50.0000 mg | ORAL_CAPSULE | Freq: Once | ORAL | Status: AC
  Administered 2022-10-27: 50 mg via ORAL
  Filled 2022-10-27: qty 2

## 2022-10-27 MED ORDER — SODIUM CHLORIDE 0.9 % IV SOLN
10.0000 mg | Freq: Once | INTRAVENOUS | Status: AC
  Administered 2022-10-27: 10 mg via INTRAVENOUS
  Filled 2022-10-27: qty 10

## 2022-10-27 MED ORDER — FAMOTIDINE IN NACL 20-0.9 MG/50ML-% IV SOLN
20.0000 mg | Freq: Once | INTRAVENOUS | Status: AC
  Administered 2022-10-27: 20 mg via INTRAVENOUS
  Filled 2022-10-27: qty 50

## 2022-10-27 MED ORDER — SODIUM CHLORIDE 0.9 % IV SOLN
375.0000 mg/m2 | Freq: Once | INTRAVENOUS | Status: AC
  Administered 2022-10-27: 800 mg via INTRAVENOUS
  Filled 2022-10-27: qty 50

## 2022-10-27 MED ORDER — SODIUM CHLORIDE 0.9 % IV SOLN: Freq: Once | INTRAVENOUS | Status: AC

## 2022-10-27 NOTE — Patient Instructions (Signed)
[facility]  Discharge Instructions: Thank you for choosing [facility] to provide your oncology and hematology care.   If you have a lab appointment with the [facility], please go directly to the [facility] and check in at the registration area.   Wear comfortable clothing and clothing appropriate for easy access to any Portacath or PICC line.   We strive to give you quality time with your provider. You may need to reschedule your appointment if you arrive late (15 or more minutes).  Arriving late affects you and other patients whose appointments are after yours.  Also, if you miss three or more appointments without notifying the office, you may be dismissed from the clinic at the provider's discretion.      For prescription refill requests, have your pharmacy contact our office and allow 72 hours for refills to be completed.    Today you received the following chemotherapy and/or immunotherapy agents: rituximab-abbs      To help prevent nausea and vomiting after your treatment, we encourage you to take your nausea medication as directed.  BELOW ARE SYMPTOMS THAT SHOULD BE REPORTED IMMEDIATELY: *FEVER GREATER THAN 100.4 F (38 C) OR HIGHER *CHILLS OR SWEATING *NAUSEA AND VOMITING THAT IS NOT CONTROLLED WITH YOUR NAUSEA MEDICATION *UNUSUAL SHORTNESS OF BREATH *UNUSUAL BRUISING OR BLEEDING *URINARY PROBLEMS (pain or burning when urinating, or frequent urination) *BOWEL PROBLEMS (unusual diarrhea, constipation, pain near the anus) TENDERNESS IN MOUTH AND THROAT WITH OR WITHOUT PRESENCE OF ULCERS (sore throat, sores in mouth, or a toothache) UNUSUAL RASH, SWELLING OR PAIN  UNUSUAL VAGINAL DISCHARGE OR ITCHING   Items with * indicate a potential emergency and should be followed up as soon as possible or go to the Emergency Department if any problems should occur.  Please show the CHEMOTHERAPY ALERT CARD or IMMUNOTHERAPY ALERT CARD at  check-in to the Emergency Department and triage nurse.  Should you have questions after your visit or need to cancel or reschedule your appointment, please contact [facility]  Dept: [phone]  and follow the prompts.  Office hours are 8:00 a.m. to 4:30 p.m. Monday - Friday. Please note that voicemails left after 4:00 p.m. may not be returned until the following business day.  We are closed weekends and major holidays. You have access to a nurse at all times for urgent questions. Please call the main number to the clinic Dept: [phone] and follow the prompts.   For any non-urgent questions, you may also contact your provider using MyChart. We now offer e-Visits for anyone 16 and older to request care online for non-urgent symptoms. For details visit [email or web address].   Also download the MyChart app! Go to the app store, search "MyChart", open the app, select [facility], and log in with your MyChart username and password.

## 2022-10-27 NOTE — Progress Notes (Signed)
Patient having severe restlessness, likely a side effect of Benadryl. Dr Irene Limbo notified and order given for 0.'5mg'$  IV Ativan to help patient relax.

## 2022-10-28 ENCOUNTER — Other Ambulatory Visit: Payer: Self-pay

## 2022-10-31 ENCOUNTER — Ambulatory Visit: Payer: Non-veteran care

## 2022-11-01 ENCOUNTER — Encounter: Payer: Self-pay | Admitting: Hematology

## 2022-11-01 NOTE — Progress Notes (Signed)
HEMATOLOGY/ONCOLOGY CLINIC VISIT NOTE  Date of Service: 10/26/2022    Patient Care Team: [facility] [place] as PCP - General (General Practice)  CHIEF COMPLAINTS/PURPOSE OF CONSULTATION:  Follow-up for continued valuation and management of relapsed mantle cell lymphoma  HISTORY OF PRESENTING ILLNESS:  Please see previous note for details of initial presentation  INTERVAL HISTORY  Jerry Tran is here for continued evaluation and management of his relapsed mantle cell lymphoma.  He notes that the steroids helped somewhat withThroat tightness and he is able to swallow his food better. He has not had his Port-A-Cath placement yet and will be starting Rituxan today with a plan to start R mini CHOP in a week after Port-A-Cath placement. MEDICAL HISTORY:  Past Medical History:  Diagnosis Date   Arthritis    Chronic kidney disease, stage 3b ([place]) 12/09/2021   Dyslipidemia    GERD (gastroesophageal reflux disease)    Hypertension    PAF (paroxysmal atrial fibrillation) (HCC)    Prostate cancer ([place]) 03/2021   s/p radiation therapy   PTSD (post-traumatic stress disorder)     SURGICAL HISTORY: Past Surgical History:  Procedure Laterality Date   LIPOMA EXCISION Right 12/09/2021   Procedure: RIGHT SUPERCLAVICULAR LYMPH NODE EXCISION;  Surgeon: Ileana Roup, MD;  Location: [facility] OR;  Service: General;  Laterality: Right;    SOCIAL HISTORY: Social History   Socioeconomic History   Marital status: Married    Spouse name: Not on file   Number of children: Not on file   Years of education: Not on file   Highest education level: Not on file  Occupational History   Occupation: retired  Tobacco Use   Smoking status: Former    Packs/day: 1.00    Years: 15.00    Total pack years: 15.00    Types: Cigarettes   Smokeless tobacco: Never  Substance and Sexual Activity   Alcohol use: Not Currently    Comment: h/o heavy use   Drug use: Not Currently    Types: Marijuana,  Cocaine    Comment: remote   Sexual activity: Not on file  Other Topics Concern   Not on file  Social History Narrative   Not on file   Social Determinants of Health   Financial Resource Strain: Not on file  Food Insecurity: Not on file  Transportation Needs: Not on file  Physical Activity: Not on file  Stress: Not on file  Social Connections: Not on file  Intimate Partner Violence: Not on file    FAMILY HISTORY: Family History  Problem Relation Age of Onset   Cancer Neg Hx     ALLERGIES:  is allergic to sildenafil.  MEDICATIONS:  Current Outpatient Medications  Medication Sig Dispense Refill   acyclovir (ZOVIRAX) 400 MG tablet TAKE 1 TABLET(400 MG) BY MOUTH DAILY 30 tablet 3   ammonium lactate (LAC-HYDRIN) 12 % lotion Apply 1 application topically 2 (two) times daily as needed for dry skin.     amoxicillin-clavulanate (AUGMENTIN) 875-125 MG tablet Take 1 tablet by mouth every 12 (twelve) hours. 13 tablet 0   atorvastatin (LIPITOR) 20 MG tablet Take 10 mg by mouth at bedtime.     Cholecalciferol (VITAMIN D3 PO) Take 1 tablet by mouth every morning.     diclofenac Sodium (VOLTAREN) 1 % GEL Apply 2 g topically 4 (four) times daily as needed (pain).     feeding supplement (ENSURE ENLIVE / ENSURE PLUS) LIQD Take 237 mLs by mouth 2 (two) times daily  between meals. 237 mL 12   flecainide (TAMBOCOR) 100 MG tablet Take 100 mg by mouth every 12 (twelve) hours.     fluticasone (FLONASE) 50 MCG/ACT nasal spray Place 1 spray into both nostrils daily as needed for allergies or rhinitis.     hydroxypropyl methylcellulose / hypromellose (ISOPTO TEARS / GONIOVISC) 2.5 % ophthalmic solution Place 2 drops into both eyes 2 (two) times daily as needed for dry eyes.     LORazepam (ATIVAN) 0.5 MG tablet Take 1 tablet (0.5 mg total) by mouth every 6 (six) hours as needed (Nausea or vomiting). (Patient not taking: Reported on 06/03/2022) 30 tablet 0   metoprolol tartrate (LOPRESSOR) 25 MG tablet Take  12.5 mg by mouth 2 (two) times daily. Take with flecainide     mirtazapine (REMERON) 30 MG tablet Take 30 mg by mouth at bedtime.     omeprazole (PRILOSEC) 20 MG capsule Take 20 mg by mouth daily before breakfast.     ondansetron (ZOFRAN) 8 MG tablet Take 1 tablet (8 mg total) by mouth 2 (two) times daily as needed for refractory nausea / vomiting. Start on day 2 after bendamustine chemo. 30 tablet 1   potassium chloride SA (KLOR-CON M) 20 MEQ tablet Take 1 tablet (20 mEq total) by mouth 2 (two) times daily. 60 tablet 0   prazosin (MINIPRESS) 2 MG capsule Take 6 mg by mouth at bedtime.     predniSONE (DELTASONE) 20 MG tablet Take 1 tablet (20 mg total) by mouth daily for 5 days. 5 tablet 0   prochlorperazine (COMPAZINE) 10 MG tablet Take 1 tablet (10 mg total) by mouth every 6 (six) hours as needed (Nausea or vomiting). 30 tablet 1   terbinafine (LAMISIL) 1 % cream Apply 1 application topically 2 (two) times daily as needed (athlete's foot).     torsemide (DEMADEX) 10 MG tablet Take 20 mg by mouth daily.     triamcinolone cream (KENALOG) 0.1 % Apply 1 application topically 2 (two) times daily as needed (rash).     No current facility-administered medications for this visit.    REVIEW OF SYSTEMS:   10 Point review of Systems was done is negative except as noted above.  PHYSICAL EXAMINATION: .BP 110/64 (BP Location: Left Arm, Patient Position: Sitting)   Pulse 75   Temp 97.6 F (36.4 C) (Temporal)   Resp 16   Ht '6\' 4"'$  (1.93 m)   Wt 202 lb 3.2 oz (91.7 kg)   SpO2 100%   BMI 24.61 kg/m  NAD GENERAL:alert, in no acute distress and comfortable SKIN: no acute rashes, no significant lesions EYES: conjunctiva are pink and non-injected, sclera anicteric OROPHARYNX: MMM, no exudates, no oropharyngeal erythema or ulceration NECK: supple, no JVD LYMPH: Significant new palpable lymphadenopathy in the cervical axillary areas as well as the inguinal areas LUNGS: clear to auscultation b/l with  normal respiratory effort HEART: regular rate & rhythm ABDOMEN:  normoactive bowel sounds , non tender, not distended. Extremity: no pedal edema PSYCH: alert & oriented x 3 with fluent speech NEURO: no focal motor/sensory deficits   LABORATORY DATA:  I have reviewed the data as listed  .    Latest Ref Rng & Units 10/26/2022   11:10 AM 10/16/2022   11:30 AM 10/03/2022    4:38 PM  CBC  WBC 4.0 - 10.5 K/uL 4.5  4.2  4.3   Hemoglobin 13.0 - 17.0 g/dL 9.4  9.2  9.5   Hematocrit 39.0 - 52.0 % 29.6  29.8  29.7   Platelets 150 - 400 K/uL 174  143  140   ANC 700     Latest Ref Rng & Units 10/26/2022   11:10 AM 10/03/2022    4:38 PM 09/08/2022   10:14 AM  CMP  Glucose 70 - 99 mg/dL 98  86  106   BUN 8 - 23 mg/dL '19  21  27   '$ Creatinine 0.61 - 1.24 mg/dL 1.09  1.04  0.94   Sodium 135 - 145 mmol/L 141  139  139   Potassium 3.5 - 5.1 mmol/L 3.3  3.5  3.7   Chloride 98 - 111 mmol/L 105  108  107   CO2 22 - 32 mmol/L '28  23  25   '$ Calcium 8.9 - 10.3 mg/dL 9.6  9.3  9.7   Total Protein 6.5 - 8.1 g/dL 7.4  7.2  8.2   Total Bilirubin 0.3 - 1.2 mg/dL 0.5  0.5  0.5   Alkaline Phos 38 - 126 U/L 78  64  71   AST 15 - 41 U/L '26  18  19   '$ ALT 0 - 44 U/L '14  13  13   '$ . Lab Results  Component Value Date   LDH 151 05/25/2022    . Uric acid 4.9 Surgical Pathology  CASE: [id number]  PATIENT: Tramaine Blunck  Flow Pathology Report  Clinical history: Mantle Cell Lymphoma of lymph nodes of multiple  regions  DIAGNOSIS:  -Monoclonal B-cell population identified  -See comment   COMMENT:   The findings are consistent with involvement by previously known mantle  cell lymphoma.    RADIOGRAPHIC STUDIES: I have personally reviewed the radiological images as listed and agreed with the findings in the report.  CT Soft Tissue Neck W Contrast  Result Date: 10/03/2022 CLINICAL DATA:  Initial evaluation for sore throat, history of lymphoma. EXAM: CT NECK WITH CONTRAST TECHNIQUE: Multidetector  CT imaging of the neck was performed using the standard protocol following the bolus administration of intravenous contrast. RADIATION DOSE REDUCTION: This exam was performed according to the departmental dose-optimization program which includes automated exposure control, adjustment of the mA and/or kV according to patient size and/or use of iterative reconstruction technique. CONTRAST:  16m OMNIPAQUE IOHEXOL 350 MG/ML SOLN COMPARISON:  None Available. FINDINGS: Pharynx and larynx: Oral cavity within normal limits. No acute inflammatory changes seen about the dentition. Irregular bulky enlargement of the lymphoid tissues including the palatine tonsils and adenoidal soft tissues is seen, slightly worse on the right. Changes are somewhat nodular in appearance (series 3, image 72 along the right epiglottis and vallecula for example). Secondary partial effacement of the nasopharyngeal and oropharyngeal airway. Parapharyngeal fat is maintained. No retropharyngeal collection or swelling. Epiglottis itself is within normal limits. Remainder of the hypopharynx and supraglottic larynx within normal limits. Negative glottis. Subglottic airway patent clear. Salivary glands: Salivary glands including the parotid and submandibular glands are within normal limits. Thyroid: Normal. Lymph nodes: Abnormal bulky enlarged adenopathy seen extending along the cervical chains bilaterally, right worse than left. Largest node/nodal conglomerate on the right seen at level II a and measures 2.7 x 3.5 cm (series 3, image 68). Largest discrete node on the left seen at level II A as well and measures 1.6 x 1.9 cm (series 3, 69). Associated posterior cervical chain on the right, with right greater than left supraclavicular adenopathy. Prominent an enlarged bilateral axillary adenopathy is seen, worse on the left. Largest axillary node on the left measures 2.0  x 2.3 cm (series 3, image 122). Findings presumably related to provided history of  lymphoma. Vascular: Normal intravascular enhancement seen throughout the neck. Limited intracranial: Unremarkable. Visualized orbits: Prior bilateral ocular lens replacement. Otherwise unremarkable. Mastoids and visualized paranasal sinuses: Mild mucoperiosteal thickening present about the right maxillary sinus. Visualized paranasal sinuses are otherwise largely clear. Mastoid air cells and middle ear cavities are well pneumatized and free of fluid. Skeleton: No discrete or worrisome osseous lesions. Moderate to advance spondylosis present at C4-5 through C6-7. Upper chest: Visualized upper chest demonstrates no other acute finding. Other: None. IMPRESSION: 1. Irregular bulky enlargement of the lymphoid tissues including the palatine tonsils and adenoidal soft tissues, right worse than left. Findings presumably related to provided history of lymphoma, although a primary head neck malignancy would be the primary differential consideration. Correlation with direct visualization recommended. 2. Abnormal enlarged bilateral cervical, supraclavicular, and axillary adenopathy, also presumably related to history of lymphoma. 3. No other acute abnormality within the neck. 4. Moderate to advance spondylosis at C4-5 through C6-7. Electronically Signed   By: Jeannine Boga M.D.   On: 10/03/2022 20:27   CT HEAD WO CONTRAST (5MM)  Result Date: 10/03/2022 CLINICAL DATA:  Bone mass or bone pain, skull, no prior imaging headache, right parietal soft tissue mass?; sinusitis EXAM: CT HEAD WITHOUT CONTRAST CT MAXILLOFACIAL WITHOUT CONTRAST TECHNIQUE: Multidetector CT imaging of the head and maxillofacial structures were performed using the standard protocol without intravenous contrast. Multiplanar CT image reconstructions of the maxillofacial structures were also generated. RADIATION DOSE REDUCTION: This exam was performed according to the departmental dose-optimization program which includes automated exposure control,  adjustment of the mA and/or kV according to patient size and/or use of iterative reconstruction technique. COMPARISON:  CT head 06/03/2022. FINDINGS: CT HEAD FINDINGS Brain: No evidence of large-territorial acute infarction. No parenchymal hemorrhage. No mass lesion. Right calvarial convexity 3 mm extra-axial fluid collection measuring slightly lower in density than gray matter. No mass effect or midline shift. No hydrocephalus. Basilar cisterns are patent. Vascular: No hyperdense vessel. Skull: No acute fracture or focal lesion. Other: There is a 1.6 x 1.2 cm right parietal scalp soft tissue dense. Associated punctate dermal calcification nonspecific. CT MAXILLOFACIAL FINDINGS Osseous: No fracture or mandibular dislocation. No destructive process. Sinuses/Orbits: Frothy secretions within the nasal cavities. Right maxillary sinus mucosal thickening. Paranasal sinuses and mastoid air cells are clear. Bilateral lens replacement. Otherwise the orbits are unremarkable. Soft tissues: Negative. Other: Visualized upper cervical spine demonstrates severe degenerative changes. Asymmetric airways (3:33). Cervical lymphadenopathy. IMPRESSION: 1. Likely subacute 3 mm right subdural hematoma. No definite acute component. 2. Indeterminate 1.6 x 1.2 cm right parietal scalp soft tissue dense. Finding may represent a sebaceous cyst. Correlate clinically. 3. No acute displaced facial fracture. 4. Asymmetric airways with underlying mass lesion not excluded. Cervical lymphadenopathy. Please see separately dictated CT neck 10/03/2022. Electronically Signed   By: Iven Finn M.D.   On: 10/03/2022 20:09   CT Maxillofacial W Contrast  Result Date: 10/03/2022 CLINICAL DATA:  Bone mass or bone pain, skull, no prior imaging headache, right parietal soft tissue mass?; sinusitis EXAM: CT HEAD WITHOUT CONTRAST CT MAXILLOFACIAL WITHOUT CONTRAST TECHNIQUE: Multidetector CT imaging of the head and maxillofacial structures were performed  using the standard protocol without intravenous contrast. Multiplanar CT image reconstructions of the maxillofacial structures were also generated. RADIATION DOSE REDUCTION: This exam was performed according to the departmental dose-optimization program which includes automated exposure control, adjustment of the mA and/or kV according to patient  size and/or use of iterative reconstruction technique. COMPARISON:  CT head 06/03/2022. FINDINGS: CT HEAD FINDINGS Brain: No evidence of large-territorial acute infarction. No parenchymal hemorrhage. No mass lesion. Right calvarial convexity 3 mm extra-axial fluid collection measuring slightly lower in density than gray matter. No mass effect or midline shift. No hydrocephalus. Basilar cisterns are patent. Vascular: No hyperdense vessel. Skull: No acute fracture or focal lesion. Other: There is a 1.6 x 1.2 cm right parietal scalp soft tissue dense. Associated punctate dermal calcification nonspecific. CT MAXILLOFACIAL FINDINGS Osseous: No fracture or mandibular dislocation. No destructive process. Sinuses/Orbits: Frothy secretions within the nasal cavities. Right maxillary sinus mucosal thickening. Paranasal sinuses and mastoid air cells are clear. Bilateral lens replacement. Otherwise the orbits are unremarkable. Soft tissues: Negative. Other: Visualized upper cervical spine demonstrates severe degenerative changes. Asymmetric airways (3:33). Cervical lymphadenopathy. IMPRESSION: 1. Likely subacute 3 mm right subdural hematoma. No definite acute component. 2. Indeterminate 1.6 x 1.2 cm right parietal scalp soft tissue dense. Finding may represent a sebaceous cyst. Correlate clinically. 3. No acute displaced facial fracture. 4. Asymmetric airways with underlying mass lesion not excluded. Cervical lymphadenopathy. Please see separately dictated CT neck 10/03/2022. Electronically Signed   By: Iven Finn M.D.   On: 10/03/2022 20:09   DG Chest 2 View  Result Date:  10/03/2022 CLINICAL DATA:  URI. Nasal congestion, cough, and fatigue for 2 weeks. EXAM: CHEST - 2 VIEW COMPARISON:  Chest radiographs 09/16/2022 FINDINGS: The cardiomediastinal silhouette is unchanged with normal heart size. There is unchanged mild scarring in the left lung base. No acute airspace consolidation, edema, pleural effusion, or pneumothorax is identified. No acute osseous abnormality is seen. IMPRESSION: No active cardiopulmonary disease. Electronically Signed   By: Logan Bores M.D.   On: 10/03/2022 15:04   DG Chest 2 View  Result Date: 09/16/2022 CLINICAL DATA:  Shortness of breath. EXAM: CHEST - 2 VIEW COMPARISON:  09/08/2022 and prior studies FINDINGS: The cardiomediastinal silhouette is unremarkable. Mild peribronchial thickening is unchanged. There is no evidence of focal airspace disease, pulmonary edema, suspicious pulmonary nodule/mass, pleural effusion, or pneumothorax. No acute bony abnormalities are identified. IMPRESSION: No active cardiopulmonary disease. Electronically Signed   By: Margarette Canada M.D.   On: 09/16/2022 13:26   DG Chest Portable 1 View  Result Date: 09/08/2022 CLINICAL DATA:  Syncope. EXAM: PORTABLE CHEST 1 VIEW COMPARISON:  Chest x-ray dated December 14, 2021. FINDINGS: The heart size and mediastinal contours are within normal limits. Minimal linear scarring at the left lung base. No focal consolidation, pleural effusion, or pneumothorax. The visualized skeletal structures are unremarkable. IMPRESSION: No active disease. Electronically Signed   By: Titus Dubin M.D.   On: 09/08/2022 10:37    ASSESSMENT & PLAN:   77 year old male with history of hypertension, dyslipidemia, paroxysmal atrial fibrillation on flecainide not on anticoagulation, recent Helicobacter pylori infection, history of prostate cancer treated with radiation therapy and 1 dose of Lupron in June 2022 with  1) recently diagnosed stage IV mantle cell lymphoma With extensive lymphadenopathy  and massive splenomegaly Likely bone marrow involvement-peripheral blood flow cytometry positive for mantle cell lymphoma. PET CT scan 12/29/2021  1. Enlarged hypermetabolic cervical, bilateral axillary, retroperitoneal, pelvic, and inguinal lymph nodes. [place] 4. 2. Subcentimeter mediastinal lymph nodes with uptake similar to mediastinal blood pool. 3. Focal hypermetabolic uptake seen at the head of the right clavicle, concerning for osseous involvement. 4. Splenomegaly. 5. Increased tonsillar soft tissue, right greater than left, concerning for lymphomatous involvement.  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  3) moderate to severe protein calorie malnutrition-much improved patient is gaining weight and has been eating much better. . Wt Readings from Last 3 Encounters:  09/08/22 195 lb (88.5 kg)  06/03/22 195 lb (88.5 kg)  05/31/22 200 lb (90.7 kg)     4) acute renal injury-likely due poor p.o. intake.  Due to spontaneous tumor lysis.  Also could be due to his hematuria and some element of urinary obstruction.  Now resolved  5) massive splenomegaly due to mantle cell lymphoma.  Now resolved on repeat imaging-  6) status post tumor lysis syndrome  PLAN: -Labs done today were discussed in detail with the patient His PET CT scan from 10/09/2022 was reviewed with him in detail He has not had his Port-A-Cath placed and therefore we will proceed with only Rituxan today and is R mini CHOP should be scheduled conveniently within a week or soon as Port-A-Cath is placed. -Next treatment with R mini CHOP will be scheduled as outpatient in about a week -Allopurinol for TLS prophylaxis As needed antiemetics available Guardant 60 results reviewed with the patient  FOLLOW-UP: The total time spent in the appointment was w 30 minutes* .  All of the patient's questions were  answered with apparent satisfaction. The patient knows to call the clinic with any problems, questions or concerns.   Sullivan Lone MD MS AAHIVMS [facility] Hematology/Oncology Physician [facility]  .*Total Encounter Time as defined by the Centers for Medicare and Medicaid Services includes, in addition to the face-to-face time of a patient visit (documented in the note above) non-face-to-face time: obtaining and reviewing outside history, ordering and reviewing medications, tests or procedures, care coordination (communications with other health care professionals or caregivers) and documentation in the medical record.   I,Param Shah,acting as a Education administrator for Sullivan Lone, MD.

## 2022-11-03 ENCOUNTER — Other Ambulatory Visit: Payer: Self-pay | Admitting: Radiology

## 2022-11-06 ENCOUNTER — Ambulatory Visit (HOSPITAL_COMMUNITY)
Admission: RE | Admit: 2022-11-06 | Discharge: 2022-11-06 | Disposition: A | Discharge status: Home / Self Care | Payer: Medicare Other | Source: Ambulatory Visit | Attending: Hematology | Admitting: Hematology

## 2022-11-06 DIAGNOSIS — I129 Hypertensive chronic kidney disease with stage 1 through stage 4 chronic kidney disease, or unspecified chronic kidney disease: Secondary | ICD-10-CM | POA: Diagnosis not present

## 2022-11-06 DIAGNOSIS — Z8546 Personal history of malignant neoplasm of prostate: Secondary | ICD-10-CM | POA: Insufficient documentation

## 2022-11-06 DIAGNOSIS — C8318 Mantle cell lymphoma, lymph nodes of multiple sites: Secondary | ICD-10-CM | POA: Insufficient documentation

## 2022-11-06 DIAGNOSIS — E785 Hyperlipidemia, unspecified: Secondary | ICD-10-CM | POA: Insufficient documentation

## 2022-11-06 DIAGNOSIS — N1832 Chronic kidney disease, stage 3b: Secondary | ICD-10-CM | POA: Insufficient documentation

## 2022-11-06 DIAGNOSIS — I480 Paroxysmal atrial fibrillation: Secondary | ICD-10-CM | POA: Diagnosis not present

## 2022-11-06 DIAGNOSIS — K219 Gastro-esophageal reflux disease without esophagitis: Secondary | ICD-10-CM | POA: Diagnosis not present

## 2022-11-06 HISTORY — PX: IR IMAGING GUIDED PORT INSERTION: IMG5740

## 2022-11-06 MED ORDER — MIDAZOLAM HCL 2 MG/2ML IJ SOLN
INTRAMUSCULAR | Status: AC | PRN
  Administered 2022-11-06: 1 mg via INTRAVENOUS

## 2022-11-06 MED ORDER — FENTANYL CITRATE (PF) 100 MCG/2ML IJ SOLN
INTRAMUSCULAR | Status: AC | PRN
  Administered 2022-11-06: 50 ug via INTRAVENOUS

## 2022-11-06 MED ORDER — LIDOCAINE-EPINEPHRINE 1 %-1:100000 IJ SOLN
INTRAMUSCULAR | Status: AC
  Administered 2022-11-06: 10 mL
  Filled 2022-11-06: qty 1

## 2022-11-06 MED ORDER — FENTANYL CITRATE (PF) 100 MCG/2ML IJ SOLN
INTRAMUSCULAR | Status: AC
  Filled 2022-11-06: qty 2

## 2022-11-06 MED ORDER — SODIUM CHLORIDE 0.9 % IV SOLN: INTRAVENOUS | Status: DC

## 2022-11-06 MED ORDER — HEPARIN SOD (PORK) LOCK FLUSH 100 UNIT/ML IV SOLN
INTRAVENOUS | Status: AC
  Administered 2022-11-06: 500 [IU] via INTRAVENOUS
  Filled 2022-11-06: qty 5

## 2022-11-06 MED ORDER — MIDAZOLAM HCL 2 MG/2ML IJ SOLN
INTRAMUSCULAR | Status: AC
  Filled 2022-11-06: qty 2

## 2022-11-06 NOTE — H&P (Signed)
Referring Physician(s): Brunetta Genera  Supervising Physician: Jacqulynn Cadet  Patient Status:  [facility]  Chief Complaint:  "I'm getting a port a cath"  Subjective: Pt known to IR service from left inguinal LN bx on 10/16/22. He has a prior hx of prostate cancer in 2022 and now with relapsing mantle cell lymphoma. He is scheduled today for port a cath placement to assist with treatment. Additional med hx as below. He denies fever,HA,CP, worsening dyspnea, cough, abd/back pain,N/V or bleeding.   Past Medical History:  Diagnosis Date   Arthritis    Chronic kidney disease, stage 3b ([place]) 12/09/2021   Dyslipidemia    GERD (gastroesophageal reflux disease)    Hypertension    PAF (paroxysmal atrial fibrillation) ([facility])    Prostate cancer ([place]) 03/2021   s/p radiation therapy   PTSD (post-traumatic stress disorder)    Past Surgical History:  Procedure Laterality Date   LIPOMA EXCISION Right 12/09/2021   Procedure: RIGHT SUPERCLAVICULAR LYMPH NODE EXCISION;  Surgeon: Ileana Roup, MD;  Location: [facility];  Service: General;  Laterality: Right;      Allergies: Sildenafil  Medications: Prior to Admission medications   Medication Sig Start Date End Date Taking? Authorizing Provider  acyclovir (ZOVIRAX) 400 MG tablet Take 1 tablet (400 mg total) by mouth daily. 10/17/22  Yes Brunetta Genera, MD  allopurinol (ZYLOPRIM) 100 MG tablet Take 1 tablet (100 mg total) by mouth 2 (two) times daily. 10/17/22  Yes Brunetta Genera, MD  ammonium lactate (LAC-HYDRIN) 12 % lotion Apply 1 application topically 2 (two) times daily as needed for dry skin.   Yes [provider]  atorvastatin (LIPITOR) 20 MG tablet Take 10 mg by mouth at bedtime.   Yes [provider]  Cholecalciferol (VITAMIN D3 PO) Take 1 tablet by mouth every morning.   Yes [provider]  diclofenac Sodium (VOLTAREN) 1 % GEL Apply 2 g topically 4 (four) times daily as needed (pain).    Yes [provider]  flecainide (TAMBOCOR) 100 MG tablet Take 100 mg by mouth every 12 (twelve) hours.   Yes [provider]  fluticasone (FLONASE) 50 MCG/ACT nasal spray Place 1 spray into both nostrils daily as needed for allergies or rhinitis.   Yes [provider]  hydroxypropyl methylcellulose / hypromellose (ISOPTO TEARS / GONIOVISC) 2.5 % ophthalmic solution Place 2 drops into both eyes 2 (two) times daily as needed for dry eyes.   Yes [provider]  metoprolol tartrate (LOPRESSOR) 25 MG tablet Take 12.5 mg by mouth 2 (two) times daily. Take with flecainide   Yes [provider]  mirtazapine (REMERON) 30 MG tablet Take 30 mg by mouth at bedtime. 01/31/22  Yes [provider]  omeprazole (PRILOSEC) 20 MG capsule Take 20 mg by mouth daily before breakfast.   Yes [provider]  potassium chloride SA (KLOR-CON M) 20 MEQ tablet Take 1 tablet (20 mEq total) by mouth 2 (two) times daily. 04/25/22  Yes Brunetta Genera, MD  prazosin (MINIPRESS) 2 MG capsule Take 6 mg by mouth at bedtime.   Yes [provider]  predniSONE (DELTASONE) 20 MG tablet Take 3 tablets (60 mg total) by mouth daily. Take with food on days 1-5 of chemotherapy. 10/17/22  Yes Brunetta Genera, MD  terbinafine (LAMISIL) 1 % cream Apply 1 application topically 2 (two) times daily as needed (athlete's foot).   Yes [provider]  torsemide (DEMADEX) 10 MG tablet Take 20  mg by mouth daily.   Yes [provider]  triamcinolone cream (KENALOG) 0.1 % Apply 1 application topically 2 (two) times daily as needed (rash).   Yes [provider]  feeding supplement (ENSURE ENLIVE / ENSURE PLUS) LIQD Take 237 mLs by mouth 2 (two) times daily between meals. 12/11/21   Regalado, Belkys A, MD  lidocaine-prilocaine (EMLA) cream Apply to affected area once 10/17/22   Brunetta Genera, MD  LORazepam (ATIVAN) 0.5 MG tablet Take 1 tablet (0.5  mg total) by mouth every 6 (six) hours as needed (Nausea or vomiting). 12/16/21   Brunetta Genera, MD  ondansetron (ZOFRAN) 8 MG tablet Take 1 tablet (8 mg total) by mouth every 8 (eight) hours as needed for nausea or vomiting. Start on the third day after cyclophosphamide chemotherapy. 10/17/22   Brunetta Genera, MD  prochlorperazine (COMPAZINE) 10 MG tablet Take 1 tablet (10 mg total) by mouth every 6 (six) hours as needed for nausea or vomiting. 10/17/22   Brunetta Genera, MD     Vital Signs: BP 128/85 (BP Location: Right Arm)   Pulse (!) 59   Temp 97.7 F (36.5 C) (Oral)   Resp 16   SpO2 100%   Physical Exam awake/alert; chest- distant BS bilat; heart- nl rate, occ ectopy; abd- soft,+BS,NT; some bilat pretibial edema noted; inguinal/cervical adenopathy  Imaging: No results found.  Labs:  CBC: Recent Labs    09/08/22 1014 10/03/22 1638 10/16/22 1130 10/26/22 1110  WBC 3.3* 4.3 4.2 4.5  HGB 10.7* 9.5* 9.2* 9.4*  HCT 34.3* 29.7* 29.8* 29.6*  PLT 182 140* 143* 174    COAGS: Recent Labs    10/16/22 1130  INR 1.1    BMP: Recent Labs    06/03/22 1309 09/08/22 1014 10/03/22 1638 10/26/22 1110  NA 138 139 139 141  K 4.0 3.7 3.5 3.3*  CL 105 107 108 105  CO2 '25 25 23 28  '$ GLUCOSE 89 106* 86 98  BUN 27* 27* 21 19  CALCIUM 9.5 9.7 9.3 9.6  CREATININE 1.05 0.94 1.04 1.09  GFRNONAA >60 >60 >60 >60    LIVER FUNCTION TESTS: Recent Labs    05/25/22 1430 09/08/22 1014 10/03/22 1638 10/26/22 1110  BILITOT 0.4 0.5 0.5 0.5  AST '17 19 18 26  '$ ALT '9 13 13 14  '$ ALKPHOS 62 71 64 78  PROT 7.4 8.2* 7.2 7.4  ALBUMIN 3.9 3.9 3.3* 3.7    Assessment and Plan: Pt known to IR service from left inguinal LN bx on 10/16/22. He has a prior hx of prostate cancer in 2022 and now with relapsing mantle cell lymphoma. He is scheduled today for port a cath placement to assist with treatment. PMH also sig for arthritis, dyslipidemia, GERD, HTN, PTSD, PAF. Risks and  benefits of image guided port-a-catheter placement was discussed with the patient /spouse including, but not limited to bleeding, infection, pneumothorax, or fibrin sheath development and need for additional procedures.  All of the patient's questions were answered, patient is agreeable to proceed. Consent signed and in chart.    Electronically Signed: D. Rowe Robert, PA-C 11/06/2022, 10:13 AM   I spent a total of 25 Minutes at the the patient's bedside AND on the patient's hospital floor or unit, greater than 50% of which was counseling/coordinating care for port a cath placement

## 2022-11-06 NOTE — Procedures (Signed)
Interventional Radiology Procedure Note  Procedure: Placement of a right IJ approach single lumen PowerPort.  Tip is positioned at the superior cavoatrial junction and catheter is ready for immediate use.  Complications: No immediate Recommendations:  - Ok to shower tomorrow - Do not submerge for 7 days - Routine line care   Signed,  Jakhiya Brower K. Meshelle Holness, MD   

## 2022-11-07 ENCOUNTER — Inpatient Hospital Stay: Payer: Medicare Other

## 2022-11-07 ENCOUNTER — Other Ambulatory Visit: Payer: Self-pay | Admitting: Hematology

## 2022-11-07 ENCOUNTER — Inpatient Hospital Stay: Payer: Medicare Other | Attending: Hematology and Oncology

## 2022-11-07 ENCOUNTER — Other Ambulatory Visit: Payer: Self-pay

## 2022-11-07 VITALS — BP 126/77 | HR 60 | Temp 98.2°F | Resp 18 | Wt 202.8 lb

## 2022-11-07 DIAGNOSIS — E883 Tumor lysis syndrome: Secondary | ICD-10-CM | POA: Insufficient documentation

## 2022-11-07 DIAGNOSIS — E785 Hyperlipidemia, unspecified: Secondary | ICD-10-CM | POA: Insufficient documentation

## 2022-11-07 DIAGNOSIS — Z5111 Encounter for antineoplastic chemotherapy: Secondary | ICD-10-CM | POA: Insufficient documentation

## 2022-11-07 DIAGNOSIS — Z5112 Encounter for antineoplastic immunotherapy: Secondary | ICD-10-CM | POA: Insufficient documentation

## 2022-11-07 DIAGNOSIS — N1832 Chronic kidney disease, stage 3b: Secondary | ICD-10-CM | POA: Diagnosis not present

## 2022-11-07 DIAGNOSIS — D649 Anemia, unspecified: Secondary | ICD-10-CM | POA: Insufficient documentation

## 2022-11-07 DIAGNOSIS — C8318 Mantle cell lymphoma, lymph nodes of multiple sites: Secondary | ICD-10-CM

## 2022-11-07 DIAGNOSIS — R590 Localized enlarged lymph nodes: Secondary | ICD-10-CM | POA: Diagnosis not present

## 2022-11-07 DIAGNOSIS — Z5189 Encounter for other specified aftercare: Secondary | ICD-10-CM | POA: Diagnosis not present

## 2022-11-07 DIAGNOSIS — R059 Cough, unspecified: Secondary | ICD-10-CM | POA: Diagnosis not present

## 2022-11-07 DIAGNOSIS — R0981 Nasal congestion: Secondary | ICD-10-CM | POA: Diagnosis not present

## 2022-11-07 DIAGNOSIS — I480 Paroxysmal atrial fibrillation: Secondary | ICD-10-CM | POA: Diagnosis not present

## 2022-11-07 DIAGNOSIS — Z8546 Personal history of malignant neoplasm of prostate: Secondary | ICD-10-CM | POA: Diagnosis not present

## 2022-11-07 DIAGNOSIS — I129 Hypertensive chronic kidney disease with stage 1 through stage 4 chronic kidney disease, or unspecified chronic kidney disease: Secondary | ICD-10-CM | POA: Diagnosis not present

## 2022-11-07 DIAGNOSIS — D696 Thrombocytopenia, unspecified: Secondary | ICD-10-CM | POA: Insufficient documentation

## 2022-11-07 DIAGNOSIS — Z87891 Personal history of nicotine dependence: Secondary | ICD-10-CM | POA: Insufficient documentation

## 2022-11-07 DIAGNOSIS — Z7189 Other specified counseling: Secondary | ICD-10-CM

## 2022-11-07 LAB — CBC WITH DIFFERENTIAL (CANCER CENTER ONLY)
Abs Immature Granulocytes: 0.02 K/uL (ref 0.00–0.07)
Basophils Absolute: 0 K/uL (ref 0.0–0.1)
Basophils Relative: 0 %
Eosinophils Absolute: 0.2 K/uL (ref 0.0–0.5)
Eosinophils Relative: 4 %
HCT: 26.4 % — ABNORMAL LOW (ref 39.0–52.0)
Hemoglobin: 8.5 g/dL — ABNORMAL LOW (ref 13.0–17.0)
Immature Granulocytes: 0 %
Lymphocytes Relative: 17 %
Lymphs Abs: 0.8 K/uL (ref 0.7–4.0)
MCH: 27.5 pg (ref 26.0–34.0)
MCHC: 32.2 g/dL (ref 30.0–36.0)
MCV: 85.4 fL (ref 80.0–100.0)
Monocytes Absolute: 0.3 K/uL (ref 0.1–1.0)
Monocytes Relative: 7 %
Neutro Abs: 3.5 K/uL (ref 1.7–7.7)
Neutrophils Relative %: 72 %
Platelet Count: 149 K/uL — ABNORMAL LOW (ref 150–400)
RBC: 3.09 MIL/uL — ABNORMAL LOW (ref 4.22–5.81)
RDW: 16.3 % — ABNORMAL HIGH (ref 11.5–15.5)
WBC Count: 4.9 K/uL (ref 4.0–10.5)
nRBC: 0 % (ref 0.0–0.2)

## 2022-11-07 LAB — CMP (CANCER CENTER ONLY)
ALT: 11 U/L (ref 0–44)
AST: 15 U/L (ref 15–41)
Albumin: 3.1 g/dL — ABNORMAL LOW (ref 3.5–5.0)
Alkaline Phosphatase: 61 U/L (ref 38–126)
Anion gap: 3 — ABNORMAL LOW (ref 5–15)
BUN: 18 mg/dL (ref 8–23)
CO2: 31 mmol/L (ref 22–32)
Calcium: 8.8 mg/dL — ABNORMAL LOW (ref 8.9–10.3)
Chloride: 109 mmol/L (ref 98–111)
Creatinine: 0.93 mg/dL (ref 0.61–1.24)
GFR, Estimated: >60 mL/min (ref >60)
Glucose, Bld: 85 mg/dL (ref 70–99)
Potassium: 3.2 mmol/L — ABNORMAL LOW (ref 3.5–5.1)
Sodium: 143 mmol/L (ref 135–145)
Total Bilirubin: 0.4 mg/dL (ref 0.3–1.2)
Total Protein: 5.8 g/dL — ABNORMAL LOW (ref 6.5–8.1)

## 2022-11-07 MED ORDER — DIPHENHYDRAMINE HCL 25 MG PO CAPS
50.0000 mg | ORAL_CAPSULE | Freq: Once | ORAL | Status: AC
  Administered 2022-11-07: 50 mg via ORAL
  Filled 2022-11-07: qty 2

## 2022-11-07 MED ORDER — SODIUM CHLORIDE 0.9 % IV SOLN
400.0000 mg/m2 | Freq: Once | INTRAVENOUS | Status: AC
  Administered 2022-11-07: 880 mg via INTRAVENOUS
  Filled 2022-11-07: qty 44

## 2022-11-07 MED ORDER — PALONOSETRON HCL INJECTION 0.25 MG/5ML
0.2500 mg | Freq: Once | INTRAVENOUS | Status: AC
  Administered 2022-11-07: 0.25 mg via INTRAVENOUS
  Filled 2022-11-07: qty 5

## 2022-11-07 MED ORDER — SODIUM CHLORIDE 0.9% FLUSH
10.0000 mL | Freq: Once | INTRAVENOUS | Status: AC
  Administered 2022-11-07: 10 mL

## 2022-11-07 MED ORDER — VINCRISTINE SULFATE CHEMO INJECTION 1 MG/ML
1.0000 mg | Freq: Once | INTRAVENOUS | Status: AC
  Administered 2022-11-07: 1 mg via INTRAVENOUS
  Filled 2022-11-07: qty 1

## 2022-11-07 MED ORDER — DOXORUBICIN HCL CHEMO IV INJECTION 2 MG/ML
25.0000 mg/m2 | Freq: Once | INTRAVENOUS | Status: AC
  Administered 2022-11-07: 56 mg via INTRAVENOUS
  Filled 2022-11-07: qty 28

## 2022-11-07 MED ORDER — SODIUM CHLORIDE 0.9 % IV SOLN: Freq: Once | INTRAVENOUS | Status: AC

## 2022-11-07 MED ORDER — SODIUM CHLORIDE 0.9 % IV SOLN
150.0000 mg | Freq: Once | INTRAVENOUS | Status: AC
  Administered 2022-11-07: 150 mg via INTRAVENOUS
  Filled 2022-11-07: qty 150

## 2022-11-07 MED ORDER — SODIUM CHLORIDE 0.9% FLUSH
10.0000 mL | INTRAVENOUS | Status: DC | PRN
  Administered 2022-11-07: 10 mL

## 2022-11-07 MED ORDER — SODIUM CHLORIDE 0.9 % IV SOLN
10.0000 mg | Freq: Once | INTRAVENOUS | Status: AC
  Administered 2022-11-07: 10 mg via INTRAVENOUS
  Filled 2022-11-07: qty 10

## 2022-11-07 MED ORDER — ACETAMINOPHEN 325 MG PO TABS
650.0000 mg | ORAL_TABLET | Freq: Once | ORAL | Status: AC
  Administered 2022-11-07: 650 mg via ORAL
  Filled 2022-11-07: qty 2

## 2022-11-07 MED ORDER — SODIUM CHLORIDE 0.9 % IV SOLN
375.0000 mg/m2 | Freq: Once | INTRAVENOUS | Status: AC
  Administered 2022-11-07: 800 mg via INTRAVENOUS
  Filled 2022-11-07: qty 50

## 2022-11-07 MED ORDER — FAMOTIDINE IN NACL 20-0.9 MG/50ML-% IV SOLN
20.0000 mg | Freq: Once | INTRAVENOUS | Status: AC
  Administered 2022-11-07: 20 mg via INTRAVENOUS
  Filled 2022-11-07: qty 50

## 2022-11-07 MED ORDER — HEPARIN SOD (PORK) LOCK FLUSH 100 UNIT/ML IV SOLN
500.0000 [IU] | Freq: Once | INTRAVENOUS | Status: AC | PRN
  Administered 2022-11-07: 500 [IU]

## 2022-11-07 NOTE — Progress Notes (Signed)
Rapid Infusion Rituximab Pharmacist Evaluation  Jerry Tran is a 77 y.o. male being treated with rituximab for NHL. This patient may be considered for RIR.   A pharmacist has verified the patient tolerated rituximab infusions per the [facility] standard infusion protocol without grade 3-4 infusion reactions. The treatment plan will be updated to reflect RIR if the patient qualifies per the checklist below:   Age > 86 years old Yes   Clinically significant cardiovascular disease No   Circulating lymphocyte count < 5000/uL prior to cycle two No  Lab Results  Component Value Date   LYMPHSABS 0.8 11/07/2022    Prior documented grade 3-4 infusion reaction to rituximab No   Prior documented grade 1-2 infusion reaction to rituximab (If YES, Pharmacist will confirm with Physician if patient is still a candidate for RIR) No   Previous rituximab infusion within the past 6 months yes  Treatment Plan updated orders to reflect RIR Yes    Jerry Tran does meet the criteria for Rapid Infusion Rituximab. This patient is going to be switched to rapid infusion rituximab.   Standard infusion rituximab had compounded for today.  Will run rituximab at 172m/hr x 30 min, then increase to 2614mhr until completion.  GuPhilomena Course1/09/24 2:05 PM

## 2022-11-07 NOTE — Patient Instructions (Signed)
[facility]  Discharge Instructions: Thank you for choosing [facility] to provide your oncology and hematology care.   If you have a lab appointment with the [facility], please go directly to the [facility] and check in at the registration area.   Wear comfortable clothing and clothing appropriate for easy access to any Portacath or PICC line.   We strive to give you quality time with your provider. You may need to reschedule your appointment if you arrive late (15 or more minutes).  Arriving late affects you and other patients whose appointments are after yours.  Also, if you miss three or more appointments without notifying the office, you may be dismissed from the clinic at the provider's discretion.      For prescription refill requests, have your pharmacy contact our office and allow 72 hours for refills to be completed.    Today you received the following chemotherapy and/or immunotherapy agents: Doxorubicin, Vincristine, Cytoxan, Rituximab      To help prevent nausea and vomiting after your treatment, we encourage you to take your nausea medication as directed.  BELOW ARE SYMPTOMS THAT SHOULD BE REPORTED IMMEDIATELY: *FEVER GREATER THAN 100.4 F (38 C) OR HIGHER *CHILLS OR SWEATING *NAUSEA AND VOMITING THAT IS NOT CONTROLLED WITH YOUR NAUSEA MEDICATION *UNUSUAL SHORTNESS OF BREATH *UNUSUAL BRUISING OR BLEEDING *URINARY PROBLEMS (pain or burning when urinating, or frequent urination) *BOWEL PROBLEMS (unusual diarrhea, constipation, pain near the anus) TENDERNESS IN MOUTH AND THROAT WITH OR WITHOUT PRESENCE OF ULCERS (sore throat, sores in mouth, or a toothache) UNUSUAL RASH, SWELLING OR PAIN  UNUSUAL VAGINAL DISCHARGE OR ITCHING   Items with * indicate a potential emergency and should be followed up as soon as possible or go to the Emergency Department if any problems should occur.  Please show the CHEMOTHERAPY ALERT CARD or  IMMUNOTHERAPY ALERT CARD at check-in to the Emergency Department and triage nurse.  Should you have questions after your visit or need to cancel or reschedule your appointment, please contact [facility]  Dept: [phone]  and follow the prompts.  Office hours are 8:00 a.m. to 4:30 p.m. Monday - Friday. Please note that voicemails left after 4:00 p.m. may not be returned until the following business day.  We are closed weekends and major holidays. You have access to a nurse at all times for urgent questions. Please call the main number to the clinic Dept: [phone] and follow the prompts.   For any non-urgent questions, you may also contact your provider using MyChart. We now offer e-Visits for anyone 18 and older to request care online for non-urgent symptoms. For details visit [email or web address].   Also download the MyChart app! Go to the app store, search "MyChart", open the app, select [facility], and log in with your MyChart username and password.                                                                                  Doxorubicin Injection What is this medication? DOXORUBICIN (dox oh ROO bi sin) treats some types of cancer. It works by slowing down the growth of cancer cells.  This medicine may be used for other purposes; ask your health care provider or pharmacist if you have questions. COMMON BRAND NAME(S): Adriamycin, Adriamycin PFS, Adriamycin RDF, Rubex What should I tell my care team before I take this medication? They need to know if you have any of these conditions: Heart disease History of low blood cell levels caused by a medication Liver disease Recent or ongoing radiation An unusual or allergic reaction to doxorubicin, other medications, foods, dyes, or preservatives If you or your partner are pregnant or trying to get pregnant Breast-feeding How should I use this medication? This medication is injected into a vein. It is  given by your care team in a hospital or clinic setting. Talk to your care team about the use of this medication in children. Special care may be needed. Overdosage: If you think you have taken too much of this medicine contact a poison control center or emergency room at once. NOTE: This medicine is only for you. Do not share this medicine with others. What if I miss a dose? Keep appointments for follow-up doses. It is important not to miss your dose. Call your care team if you are unable to keep an appointment. What may interact with this medication? 6-mercaptopurine Paclitaxel Phenytoin St. John's wort Trastuzumab Verapamil This list may not describe all possible interactions. Give your health care provider a list of all the medicines, herbs, non-prescription drugs, or dietary supplements you use. Also tell them if you smoke, drink alcohol, or use illegal drugs. Some items may interact with your medicine. What should I watch for while using this medication? Your condition will be monitored carefully while you are receiving this medication. You may need blood work while taking this medication. This medication may make you feel generally unwell. This is not uncommon as chemotherapy can affect healthy cells as well as cancer cells. Report any side effects. Continue your course of treatment even though you feel ill unless your care team tells you to stop. There is a maximum amount of this medication you should receive throughout your life. The amount depends on the medical condition being treated and your overall health. Your care team will watch how much of this medication you receive. Tell your care team if you have taken this medication before. Your urine may turn red for a few days after your dose. This is not blood. If your urine is dark or brown, call your care team. In some cases, you may be given additional medications to help with side effects. Follow all directions for their use. This  medication may increase your risk of getting an infection. Call your care team for advice if you get a fever, chills, sore throat, or other symptoms of a cold or flu. Do not treat yourself. Try to avoid being around people who are sick. This medication may increase your risk to bruise or bleed. Call your care team if you notice any unusual bleeding. Talk to your care team about your risk of cancer. You may be more at risk for certain types of cancers if you take this medication. You should make sure that you get enough Coenzyme Q10 while you are taking this medication. Discuss the foods you eat and the vitamins you take with your care team. Talk to your care team if you or your partner may be pregnant. Serious birth defects can occur if you take this medication during pregnancy and for 6 months after the last dose. Contraception is recommended while taking this  medication and for 6 months after the last dose. Your care team can help you find the option that works for you. If your partner can get pregnant, use a condom while taking this medication and for 6 months after the last dose. Do not breastfeed while taking this medication. This medication may cause infertility. Talk to your care team if you are concerned about your fertility. What side effects may I notice from receiving this medication? Side effects that you should report to your care team as soon as possible: Allergic reactions--skin rash, itching, hives, swelling of the face, lips, tongue, or throat Heart failure--shortness of breath, swelling of the ankles, feet, or hands, sudden weight gain, unusual weakness or fatigue Heart rhythm changes--fast or irregular heartbeat, dizziness, feeling faint or lightheaded, chest pain, trouble breathing Infection--fever, chills, cough, sore throat, wounds that don't heal, pain or trouble when passing urine, general feeling of discomfort or being unwell Low red blood cell level--unusual weakness or  fatigue, dizziness, headache, trouble breathing Painful swelling, warmth, or redness of the skin, blisters or sores at the infusion site Unusual bruising or bleeding Side effects that usually do not require medical attention (report to your care team if they continue or are bothersome): Diarrhea Hair loss Nausea Pain, redness, or swelling with sores inside the mouth or throat Red urine This list may not describe all possible side effects. Call your doctor for medical advice about side effects. You may report side effects to FDA at 1-800-FDA-1088. Where should I keep my medication? This medication is given in a hospital or clinic. It will not be stored at home. NOTE: This sheet is a summary. It may not cover all possible information. If you have questions about this medicine, talk to your doctor, pharmacist, or health care provider.  2023 Elsevier/Gold Standard (2022-02-22 00:00:00)                                                                                  Vincristine Injection What is this medication? VINCRISTINE (vin KRIS teen) treats some types of cancer. It works by slowing down the growth of cancer cells. This medicine may be used for other purposes; ask your health care provider or pharmacist if you have questions. COMMON BRAND NAME(S): Oncovin, Vincasar PFS What should I tell my care team before I take this medication? They need to know if you have any of these conditions: Infection Kidney disease Liver disease Low white blood cell levels Lung disease Nervous system disease, such as Charcot-Marie-Tooth (CMT) Recent or ongoing radiation therapy An unusual or allergic reaction to vincristine, other chemotherapy agents, other medications, foods, dyes, or preservatives Pregnant or trying to get pregnant Breast-feeding How should I use this medication? This medication is infused into a vein. It is given by your care team in a hospital or clinic setting. Talk to your care  team about the use of this medication in children. While it may be given to children for selected conditions, precautions do apply. Overdosage: If you think you have taken too much of this medicine contact a poison control center or emergency room at once. NOTE: This medicine is only for you. Do not share this medicine  with others. What if I miss a dose? Keep appointments for follow-up doses. It is important not to miss your dose. Call your care team if you are unable to keep an appointment. What may interact with this medication? Do not take this medication with any of the following: Live virus vaccines This medication may also interact with the following: Medications for fungal infections, such as itraconazole or fluconazole Phenytoin Supplements, such as St. John's wort This list may not describe all possible interactions. Give your health care provider a list of all the medicines, herbs, non-prescription drugs, or dietary supplements you use. Also tell them if you smoke, drink alcohol, or use illegal drugs. Some items may interact with your medicine. What should I watch for while using this medication? Your condition will be monitored carefully while you are receiving this medication. This medication may make you feel generally unwell. This is not uncommon as chemotherapy can affect healthy cells as well as cancer cells. Report any side effects. Continue your course of treatment even though you feel ill unless your care team tells you to stop. You may need blood work while taking this medication. This medication may increase your risk to bruise or bleed. Call your care team if you notice any unusual bleeding. This medication may increase your risk of getting an infection. Call your care team for advice if you get a fever, chills, sore throat, or other symptoms of a cold or flu. Do not treat yourself. Try to avoid being around people who are sick. This medication will cause constipation. If you  do not have a bowel movement for 3 days, call your care team. Call your care team if you are around anyone with measles, chickenpox, or if you develop sores or blisters that do not heal properly. Be careful brushing or flossing your teeth or using a toothpick because you may get an infection or bleed more easily. If you have any dental work done, tell your dentist you are receiving this medication Talk to your care team if you or your partner wish to become pregnant or think either of you might be pregnant. This medication can cause serious birth defects. This medication may cause infertility. Talk to your care team if you are concerned about your fertility. Talk to your care team before breastfeeding. Changes to your treatment plan may be needed. What side effects may I notice from receiving this medication? Side effects that you should report to your care team as soon as possible: Allergic reactions--skin rash, itching, hives, swelling of the face, lips, tongue, or throat High uric acid level--severe pain, redness, warmth, or swelling in joints, pain or trouble passing urine, pain in the lower back or sides Infection--fever, chills, cough, sore throat, wounds that don't heal, pain or trouble when passing urine, general feeling of discomfort or being unwell Pain, tingling, or numbness in the hands or feet, muscle weakness, change in vision, confusion or trouble speaking, loss of balance or coordination, trouble walking, seizures Painful swelling, warmth, or redness of the skin, blisters or sores at the infusion site Shortness of breath or trouble breathing Side effects that usually do not require medical attention (report to your care team if they continue or are bothersome): Constipation Diarrhea Hair loss Loss of appetite Nausea Stomach cramping Vomiting This list may not describe all possible side effects. Call your doctor for medical advice about side effects. You may report side effects  to FDA at 1-800-FDA-1088. Where should I keep my medication? This medication  is given in a hospital or clinic. It will not be stored at home. NOTE: This sheet is a summary. It may not cover all possible information. If you have questions about this medicine, talk to your doctor, pharmacist, or health care provider.  2023 Elsevier/Gold Standard (2022-01-10 00:00:00)                                                                    Cyclophosphamide Injection What is this medication? CYCLOPHOSPHAMIDE (sye kloe FOSS fa mide) treats some types of cancer. It works by slowing down the growth of cancer cells. This medicine may be used for other purposes; ask your health care provider or pharmacist if you have questions. COMMON BRAND NAME(S): Cyclophosphamide, Cytoxan, Neosar What should I tell my care team before I take this medication? They need to know if you have any of these conditions: Heart disease Irregular heartbeat or rhythm Infection Kidney problems Liver disease Low blood cell levels (white cells, platelets, or red blood cells) Lung disease Previous radiation Trouble passing urine An unusual or allergic reaction to cyclophosphamide, other medications, foods, dyes, or preservatives Pregnant or trying to get pregnant Breast-feeding How should I use this medication? This medication is injected into a vein. It is given by your care team in a hospital or clinic setting. Talk to your care team about the use of this medication in children. Special care may be needed. Overdosage: If you think you have taken too much of this medicine contact a poison control center or emergency room at once. NOTE: This medicine is only for you. Do not share this medicine with others. What if I miss a dose? Keep appointments for follow-up doses. It is important not to miss your dose. Call your care team if you are unable to keep an appointment. What may interact with this medication? Amphotericin  B Amiodarone Azathioprine Certain antivirals for HIV or hepatitis Certain medications for blood pressure, such as enalapril, lisinopril, quinapril Cyclosporine Diuretics Etanercept Indomethacin Medications that relax muscles Metronidazole Natalizumab Tamoxifen Warfarin This list may not describe all possible interactions. Give your health care provider a list of all the medicines, herbs, non-prescription drugs, or dietary supplements you use. Also tell them if you smoke, drink alcohol, or use illegal drugs. Some items may interact with your medicine. What should I watch for while using this medication? This medication may make you feel generally unwell. This is not uncommon as chemotherapy can affect healthy cells as well as cancer cells. Report any side effects. Continue your course of treatment even though you feel ill unless your care team tells you to stop. You may need blood work while you are taking this medication. This medication may increase your risk of getting an infection. Call your care team for advice if you get a fever, chills, sore throat, or other symptoms of a cold or flu. Do not treat yourself. Try to avoid being around people who are sick. Avoid taking medications that contain aspirin, acetaminophen, ibuprofen, naproxen, or ketoprofen unless instructed by your care team. These medications may hide a fever. Be careful brushing or flossing your teeth or using a toothpick because you may get an infection or bleed more easily. If you have any dental work done, tell your dentist  you are receiving this medication. Drink water or other fluids as directed. Urinate often, even at night. Some products may contain alcohol. Ask your care team if this medication contains alcohol. Be sure to tell all care teams you are taking this medicine. Certain medicines, like metronidazole and disulfiram, can cause an unpleasant reaction when taken with alcohol. The reaction includes flushing,  headache, nausea, vomiting, sweating, and increased thirst. The reaction can last from 30 minutes to several hours. Talk to your care team if you wish to become pregnant or think you might be pregnant. This medication can cause serious birth defects if taken during pregnancy and for 1 year after the last dose. A negative pregnancy test is required before starting this medication. A reliable form of contraception is recommended while taking this medication and for 1 year after the last dose. Talk to your care team about reliable forms of contraception. Do not father a child while taking this medication and for 4 months after the last dose. Use a condom during this time period. Do not breast-feed while taking this medication or for 1 week after the last dose. This medication may cause infertility. Talk to your care team if you are concerned about your fertility. Talk to your care team about your risk of cancer. You may be more at risk for certain types of cancer if you take this medication. What side effects may I notice from receiving this medication? Side effects that you should report to your care team as soon as possible: Allergic reactions--skin rash, itching, hives, swelling of the face, lips, tongue, or throat Dry cough, shortness of breath or trouble breathing Heart failure--shortness of breath, swelling of the ankles, feet, or hands, sudden weight gain, unusual weakness or fatigue Heart muscle inflammation--unusual weakness or fatigue, shortness of breath, chest pain, fast or irregular heartbeat, dizziness, swelling of the ankles, feet, or hands Heart rhythm changes--fast or irregular heartbeat, dizziness, feeling faint or lightheaded, chest pain, trouble breathing Infection--fever, chills, cough, sore throat, wounds that don't heal, pain or trouble when passing urine, general feeling of discomfort or being unwell Kidney injury--decrease in the amount of urine, swelling of the ankles, hands, or  feet Liver injury--right upper belly pain, loss of appetite, nausea, light-colored stool, dark yellow or brown urine, yellowing skin or eyes, unusual weakness or fatigue Low red blood cell level--unusual weakness or fatigue, dizziness, headache, trouble breathing Low sodium level--muscle weakness, fatigue, dizziness, headache, confusion Red or dark brown urine Unusual bruising or bleeding Side effects that usually do not require medical attention (report to your care team if they continue or are bothersome): Hair loss Irregular menstrual cycles or spotting Loss of appetite Nausea Pain, redness, or swelling with sores inside the mouth or throat Vomiting This list may not describe all possible side effects. Call your doctor for medical advice about side effects. You may report side effects to FDA at 1-800-FDA-1088. Where should I keep my medication? This medication is given in a hospital or clinic. It will not be stored at home. NOTE: This sheet is a summary. It may not cover all possible information. If you have questions about this medicine, talk to your doctor, pharmacist, or health care provider.  2023 Elsevier/Gold Standard (2021-12-06 00:00:00)  Rituximab Injection What is this medication? RITUXIMAB (ri TUX i mab) treats leukemia and lymphoma. It works by blocking a protein that causes cancer cells to grow and multiply. This helps to slow or stop the spread of cancer cells. It may also be used to treat autoimmune conditions, such as arthritis. It works by slowing down an overactive immune system. It is a monoclonal antibody. This medicine may be used for other purposes; ask your health care provider or pharmacist if you have questions. COMMON BRAND NAME(S): RIABNI, Rituxan, RUXIENCE, truxima What should I tell my care team before I take this medication? They need to know if you have any of these  conditions: Chest pain Heart disease Immune system problems Infection, such as chickenpox, cold sores, hepatitis B, herpes Irregular heartbeat or rhythm Kidney disease Low blood counts, such as low white cells, platelets, red cells Lung disease Recent or upcoming vaccine An unusual or allergic reaction to rituximab, other medications, foods, dyes, or preservatives Pregnant or trying to get pregnant Breast-feeding How should I use this medication? This medication is injected into a vein. It is given by a care team in a hospital or clinic setting. A special MedGuide will be given to you before each treatment. Be sure to read this information carefully each time. Talk to your care team about the use of this medication in children. While this medication may be prescribed for children as young as 6 months for selected conditions, precautions do apply. Overdosage: If you think you have taken too much of this medicine contact a poison control center or emergency room at once. NOTE: This medicine is only for you. Do not share this medicine with others. What if I miss a dose? Keep appointments for follow-up doses. It is important not to miss your dose. Call your care team if you are unable to keep an appointment. What may interact with this medication? Do not take this medication with any of the following: Live vaccines This medication may also interact with the following: Cisplatin This list may not describe all possible interactions. Give your health care provider a list of all the medicines, herbs, non-prescription drugs, or dietary supplements you use. Also tell them if you smoke, drink alcohol, or use illegal drugs. Some items may interact with your medicine. What should I watch for while using this medication? Your condition will be monitored carefully while you are receiving this medication. You may need blood work while taking this medication. This medication can cause serious infusion  reactions. To reduce the risk your care team may give you other medications to take before receiving this one. Be sure to follow the directions from your care team. This medication may increase your risk of getting an infection. Call your care team for advice if you get a fever, chills, sore throat, or other symptoms of a cold or flu. Do not treat yourself. Try to avoid being around people who are sick. Call your care team if you are around anyone with measles, chickenpox, or if you develop sores or blisters that do not heal properly. Avoid taking medications that contain aspirin, acetaminophen, ibuprofen, naproxen, or ketoprofen unless instructed by your care team. These medications may hide a fever. This medication may cause serious skin reactions. They can happen weeks to months after starting the medication. Contact your care team right away if you notice fevers or flu-like symptoms with a rash. The rash may be red or purple and then turn into blisters or peeling of the  skin. You may also notice a red rash with swelling of the face, lips, or lymph nodes in your neck or under your arms. In some patients, this medication may cause a serious brain infection that may cause death. If you have any problems seeing, thinking, speaking, walking, or standing, tell your care team right away. If you cannot reach your care team, urgently seek another source of medical care. Talk to your care team if you may be pregnant. Serious birth defects can occur if you take this medication during pregnancy and for 12 months after the last dose. You will need a negative pregnancy test before starting this medication. Contraception is recommended while taking this medication and for 12 months after the last dose. Your care team can help you find the option that works for you. Do not breastfeed while taking this medication and for at least 6 months after the last dose. What side effects may I notice from receiving this  medication? Side effects that you should report to your care team as soon as possible: Allergic reactions or angioedema--skin rash, itching or hives, swelling of the face, eyes, lips, tongue, arms, or legs, trouble swallowing or breathing Bowel blockage--stomach cramping, unable to have a bowel movement or pass gas, loss of appetite, vomiting Dizziness, loss of balance or coordination, confusion or trouble speaking Heart attack--pain or tightness in the chest, shoulders, arms, or jaw, nausea, shortness of breath, cold or clammy skin, feeling faint or lightheaded Heart rhythm changes--fast or irregular heartbeat, dizziness, feeling faint or lightheaded, chest pain, trouble breathing Infection--fever, chills, cough, sore throat, wounds that don't heal, pain or trouble when passing urine, general feeling of discomfort or being unwell Infusion reactions--chest pain, shortness of breath or trouble breathing, feeling faint or lightheaded Kidney injury--decrease in the amount of urine, swelling of the ankles, hands, or feet Liver injury--right upper belly pain, loss of appetite, nausea, light-colored stool, dark yellow or brown urine, yellowing skin or eyes, unusual weakness or fatigue Redness, blistering, peeling, or loosening of the skin, including inside the mouth Stomach pain that is severe, does not go away, or gets worse Tumor lysis syndrome (TLS)--nausea, vomiting, diarrhea, decrease in the amount of urine, dark urine, unusual weakness or fatigue, confusion, muscle pain or cramps, fast or irregular heartbeat, joint pain Side effects that usually do not require medical attention (report to your care team if they continue or are bothersome): Headache Joint pain Nausea Runny or stuffy nose Unusual weakness or fatigue This list may not describe all possible side effects. Call your doctor for medical advice about side effects. You may report side effects to FDA at 1-800-FDA-1088. Where should I keep  my medication? This medication is given in a hospital or clinic. It will not be stored at home. NOTE: This sheet is a summary. It may not cover all possible information. If you have questions about this medicine, talk to your doctor, pharmacist, or health care provider.  2023 [employer] (2022-02-28 00:00:00)

## 2022-11-08 ENCOUNTER — Telehealth: Payer: Self-pay

## 2022-11-08 NOTE — Telephone Encounter (Signed)
-----   Message from Kem Kays, RN sent at 11/07/2022  4:57 PM EST ----- Regarding: First time/Dr. 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 Tolerated treatment well. Thanks!

## 2022-11-08 NOTE — Telephone Encounter (Signed)
LM for patient that this nurse was calling to see how they were doing after their treatment. Please call back to Dr. Kale's nurse at [phone] if they have any questions or concerns regarding the treatment. 

## 2022-11-10 IMAGING — CR DG CHEST 2V
2 series · 2 of 2 positions shown · non-contrast
Comparison: Chest x-ray 12/08/2021.  Chest CT 12/09/2021

CLINICAL DATA: Short of breath.

EXAM:
CHEST - 2 VIEW

[chest lat]
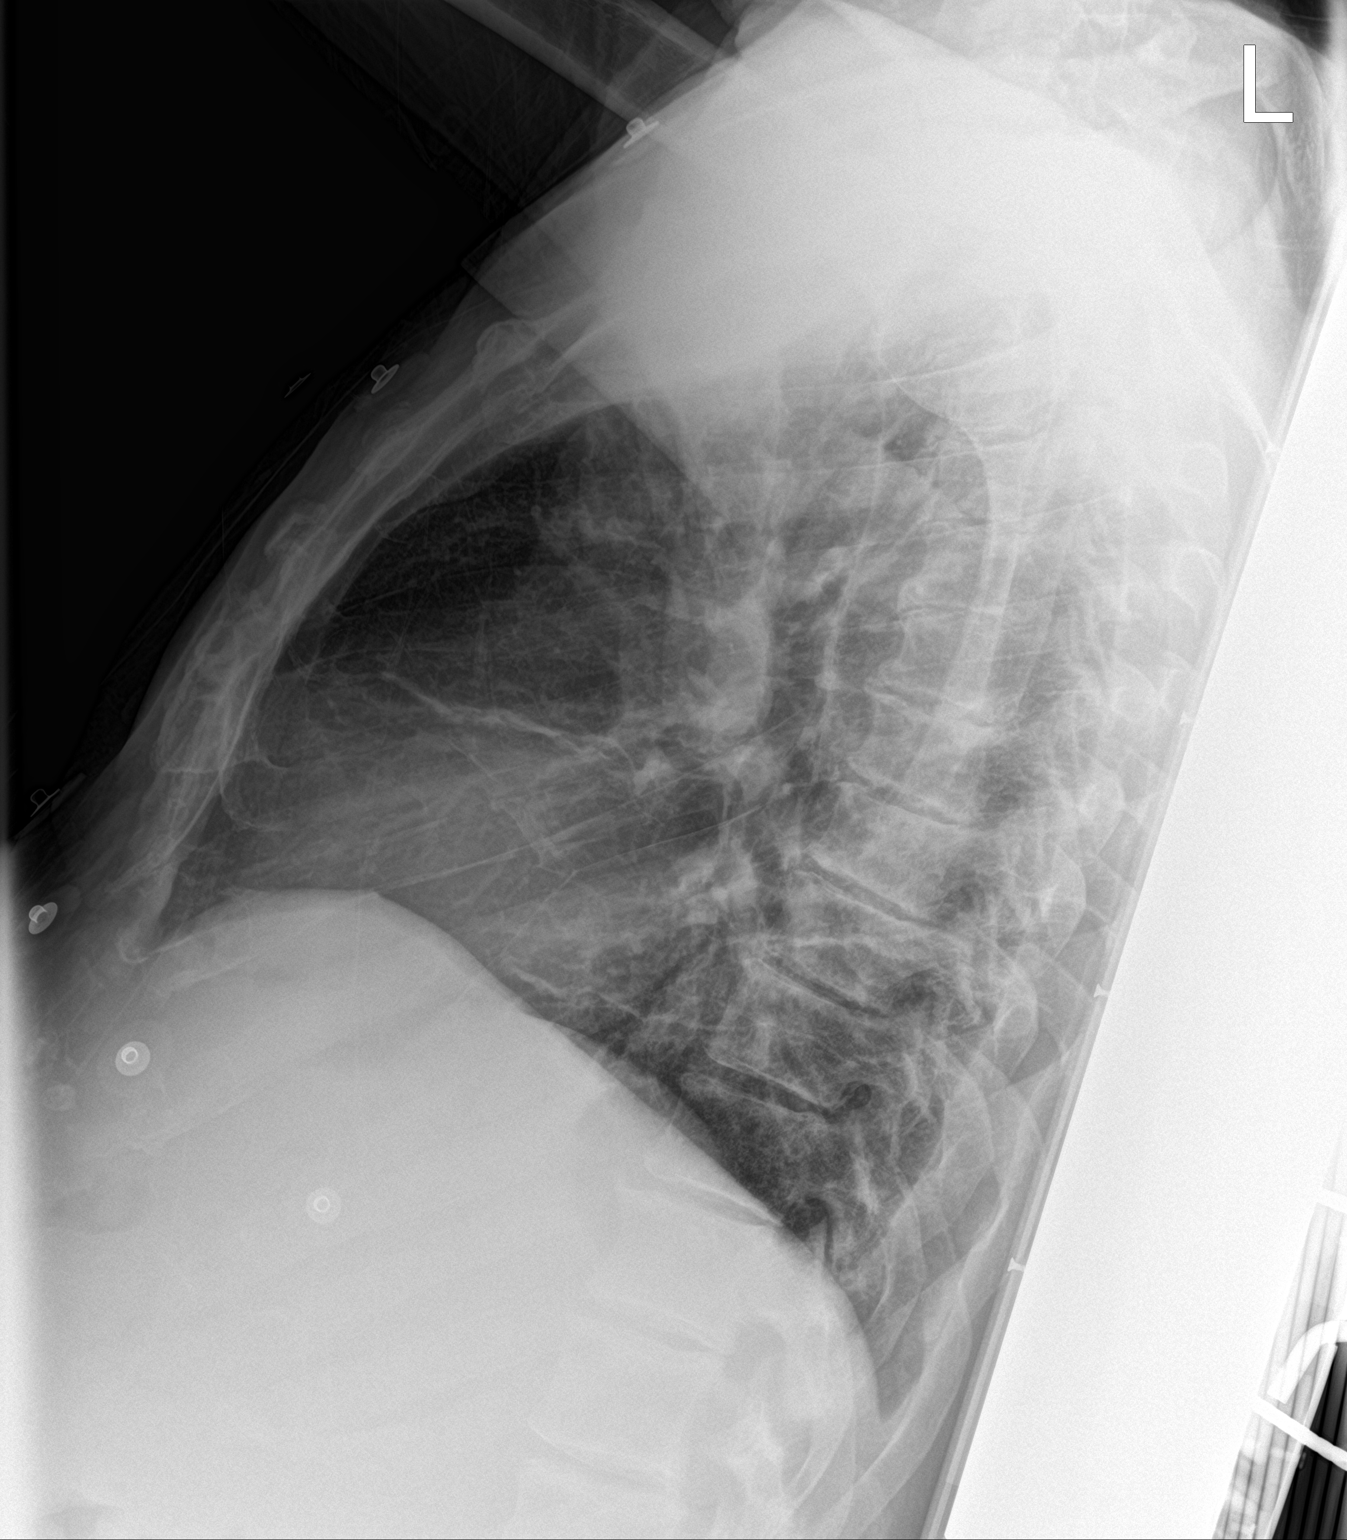

[chest ap]
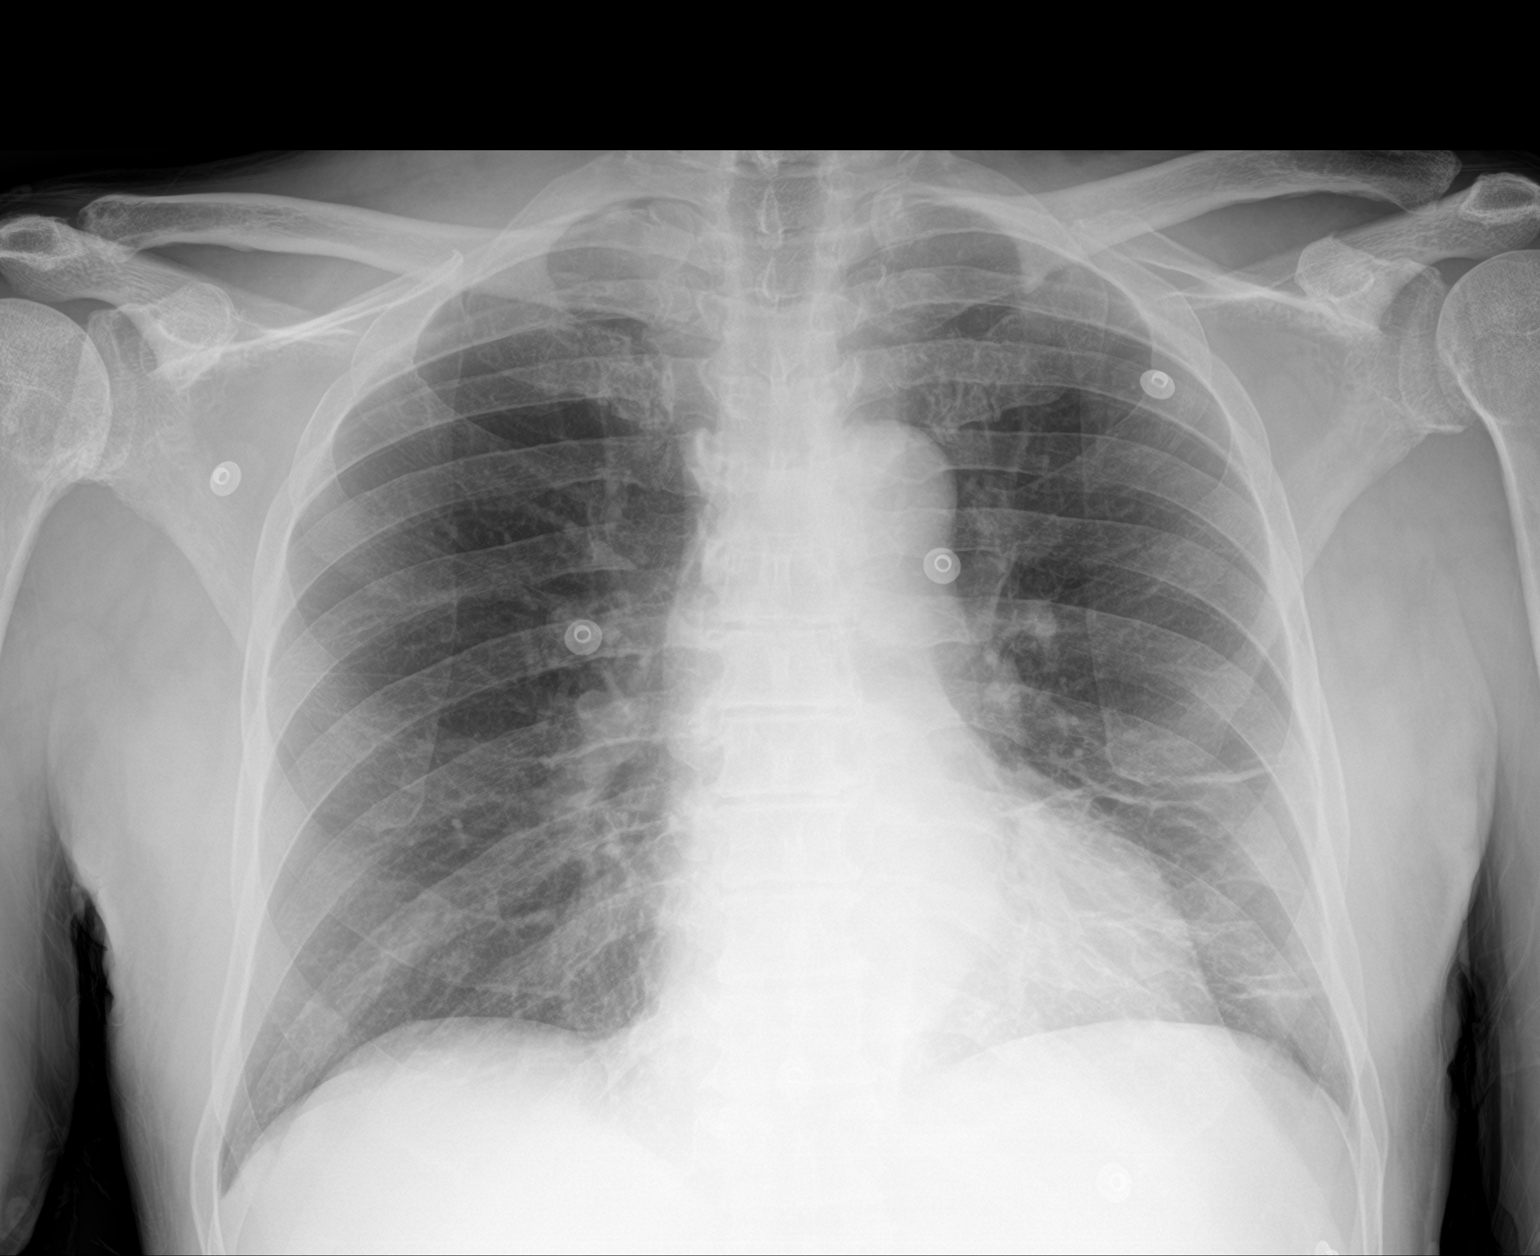

[2 of 2 positions shown; findings below may reference images not displayed]

FINDINGS: Linear horizontal densities in the left lung base and lingula appear
unchanged. No new infiltrate or effusion. Negative for heart failure
or edema.
IMPRESSION: Persistent linear densities in the lingula and left lung base likely
due to scarring or atelectasis.

## 2022-11-13 ENCOUNTER — Inpatient Hospital Stay: Payer: Medicare Other

## 2022-11-13 VITALS — BP 125/82 | HR 71 | Temp 98.7°F | Resp 16

## 2022-11-13 DIAGNOSIS — C8318 Mantle cell lymphoma, lymph nodes of multiple sites: Secondary | ICD-10-CM

## 2022-11-13 DIAGNOSIS — Z5112 Encounter for antineoplastic immunotherapy: Secondary | ICD-10-CM | POA: Diagnosis not present

## 2022-11-13 DIAGNOSIS — Z7189 Other specified counseling: Secondary | ICD-10-CM

## 2022-11-13 MED ORDER — PEGFILGRASTIM-CBQV 6 MG/0.6ML ~~LOC~~ SOSY
6.0000 mg | PREFILLED_SYRINGE | Freq: Once | SUBCUTANEOUS | Status: AC
  Administered 2022-11-13: 6 mg via SUBCUTANEOUS
  Filled 2022-11-13: qty 0.6

## 2022-11-13 NOTE — Patient Instructions (Signed)

## 2022-11-17 ENCOUNTER — Ambulatory Visit: Payer: Non-veteran care | Admitting: Hematology

## 2022-11-17 ENCOUNTER — Ambulatory Visit: Payer: Non-veteran care

## 2022-11-17 ENCOUNTER — Other Ambulatory Visit: Payer: Non-veteran care

## 2022-11-20 ENCOUNTER — Ambulatory Visit: Payer: Non-veteran care

## 2022-11-21 ENCOUNTER — Other Ambulatory Visit: Payer: Self-pay

## 2022-11-21 ENCOUNTER — Inpatient Hospital Stay: Payer: Medicare Other

## 2022-11-21 ENCOUNTER — Inpatient Hospital Stay (HOSPITAL_BASED_OUTPATIENT_CLINIC_OR_DEPARTMENT_OTHER): Payer: Medicare Other | Admitting: Hematology

## 2022-11-21 VITALS — BP 120/68 | HR 73 | Temp 97.9°F | Resp 18 | Wt 195.3 lb

## 2022-11-21 DIAGNOSIS — Z5111 Encounter for antineoplastic chemotherapy: Secondary | ICD-10-CM | POA: Diagnosis not present

## 2022-11-21 DIAGNOSIS — C8318 Mantle cell lymphoma, lymph nodes of multiple sites: Secondary | ICD-10-CM

## 2022-11-21 DIAGNOSIS — E883 Tumor lysis syndrome: Secondary | ICD-10-CM

## 2022-11-21 DIAGNOSIS — Z5112 Encounter for antineoplastic immunotherapy: Secondary | ICD-10-CM | POA: Diagnosis not present

## 2022-11-21 LAB — CBC WITH DIFFERENTIAL (CANCER CENTER ONLY)
Abs Immature Granulocytes: 0.26 10*3/uL — ABNORMAL HIGH (ref 0.00–0.07)
Basophils Absolute: 0 10*3/uL (ref 0.0–0.1)
Basophils Relative: 0 %
Eosinophils Absolute: 0.1 10*3/uL (ref 0.0–0.5)
Eosinophils Relative: 1 %
HCT: 26.9 % — ABNORMAL LOW (ref 39.0–52.0)
Hemoglobin: 8.7 g/dL — ABNORMAL LOW (ref 13.0–17.0)
Immature Granulocytes: 6 %
Lymphocytes Relative: 13 %
Lymphs Abs: 0.6 10*3/uL — ABNORMAL LOW (ref 0.7–4.0)
MCH: 27.6 pg (ref 26.0–34.0)
MCHC: 32.3 g/dL (ref 30.0–36.0)
MCV: 85.4 fL (ref 80.0–100.0)
Monocytes Absolute: 0.6 10*3/uL (ref 0.1–1.0)
Monocytes Relative: 14 %
Neutro Abs: 3 10*3/uL (ref 1.7–7.7)
Neutrophils Relative %: 66 %
Platelet Count: 154 10*3/uL (ref 150–400)
RBC: 3.15 MIL/uL — ABNORMAL LOW (ref 4.22–5.81)
RDW: 16.7 % — ABNORMAL HIGH (ref 11.5–15.5)
WBC Count: 4.6 10*3/uL (ref 4.0–10.5)
nRBC: 0 % (ref 0.0–0.2)

## 2022-11-21 LAB — CMP (CANCER CENTER ONLY)
ALT: 9 U/L (ref 0–44)
AST: 13 U/L — ABNORMAL LOW (ref 15–41)
Albumin: 3.4 g/dL — ABNORMAL LOW (ref 3.5–5.0)
Alkaline Phosphatase: 74 U/L (ref 38–126)
Anion gap: 6 (ref 5–15)
BUN: 17 mg/dL (ref 8–23)
CO2: 29 mmol/L (ref 22–32)
Calcium: 9.3 mg/dL (ref 8.9–10.3)
Chloride: 108 mmol/L (ref 98–111)
Creatinine: 1.04 mg/dL (ref 0.61–1.24)
GFR, Estimated: >60 mL/min (ref >60)
Glucose, Bld: 83 mg/dL (ref 70–99)
Potassium: 3.6 mmol/L (ref 3.5–5.1)
Sodium: 143 mmol/L (ref 135–145)
Total Bilirubin: 0.3 mg/dL (ref 0.3–1.2)
Total Protein: 6 g/dL — ABNORMAL LOW (ref 6.5–8.1)

## 2022-11-21 MED ORDER — SODIUM CHLORIDE 0.9% FLUSH
10.0000 mL | Freq: Once | INTRAVENOUS | Status: AC
  Administered 2022-11-21: 10 mL

## 2022-11-21 MED ORDER — HEPARIN SOD (PORK) LOCK FLUSH 100 UNIT/ML IV SOLN
500.0000 [IU] | Freq: Once | INTRAVENOUS | Status: AC
  Administered 2022-11-21: 500 [IU]

## 2022-11-21 NOTE — Progress Notes (Signed)
HEMATOLOGY/ONCOLOGY CLINIC VISIT NOTE  Date of Service: 11/21/22    Patient Care Team: [facility] [place] as PCP - General (General Practice)  CHIEF COMPLAINTS/PURPOSE OF CONSULTATION:  Follow-up for continued evaluation and management of relapsed mantle cell lymphoma  HISTORY OF PRESENTING ILLNESS:  Please see previous note for details of initial presentation  INTERVAL HISTORY  Jerry Tran is here for continued evaluation and management of his relapsed mantle cell lymphoma.   Patient was last seen by me on 10/26/2022 and he was doing well overall.   Patient is accompanied by his wife during this visit. He reports he has been doing well without any new medical concern since our last visit. He notes he is tolerating his treatment well without any toxicity. He denies fever, night sweats, new infection issues, swallowing problem, loss of appetite, unexpected weight loss, abdominal pain, back pain, chest pain. He does complain of mild chills and mild bilateral leg swelling.   He does report of some skin rash throughout the body, but not severe. He also complains of left locked knee and knee stiffness which he limits him from walking. Patient has not been to orthopedics.   Patient has received his influenza vaccine and COVID-19 Booster, but not RSV vaccine yet.   MEDICAL HISTORY:  Past Medical History:  Diagnosis Date   Arthritis    Chronic kidney disease, stage 3b ([place]) 12/09/2021   Dyslipidemia    GERD (gastroesophageal reflux disease)    Hypertension    PAF (paroxysmal atrial fibrillation) (HCC)    Prostate cancer ([place]) 03/2021   s/p radiation therapy   PTSD (post-traumatic stress disorder)     SURGICAL HISTORY: Past Surgical History:  Procedure Laterality Date   IR IMAGING GUIDED PORT INSERTION  11/06/2022   LIPOMA EXCISION Right 12/09/2021   Procedure: RIGHT SUPERCLAVICULAR LYMPH NODE EXCISION;  Surgeon: Ileana Roup, MD;  Location: [facility] OR;  Service:  General;  Laterality: Right;    SOCIAL HISTORY: Social History   Socioeconomic History   Marital status: Married    Spouse name: Not on file   Number of children: Not on file   Years of education: Not on file   Highest education level: Not on file  Occupational History   Occupation: retired  Tobacco Use   Smoking status: Former    Packs/day: 1.00    Years: 15.00    Total pack years: 15.00    Types: Cigarettes   Smokeless tobacco: Never  Substance and Sexual Activity   Alcohol use: Not Currently    Comment: h/o heavy use   Drug use: Not Currently    Types: Marijuana, Cocaine    Comment: remote   Sexual activity: Not on file  Other Topics Concern   Not on file  Social History Narrative   Not on file   Social Determinants of Health   Financial Resource Strain: Not on file  Food Insecurity: Not on file  Transportation Needs: Not on file  Physical Activity: Not on file  Stress: Not on file  Social Connections: Not on file  Intimate Partner Violence: Not on file    FAMILY HISTORY: Family History  Problem Relation Age of Onset   Cancer Neg Hx     ALLERGIES:  is allergic to sildenafil.  MEDICATIONS:  Current Outpatient Medications  Medication Sig Dispense Refill   acyclovir (ZOVIRAX) 400 MG tablet Take 1 tablet (400 mg total) by mouth daily. 30 tablet 3   allopurinol (ZYLOPRIM) 100 MG tablet  Take 1 tablet (100 mg total) by mouth 2 (two) times daily. 60 tablet 0   ammonium lactate (LAC-HYDRIN) 12 % lotion Apply 1 application topically 2 (two) times daily as needed for dry skin.     atorvastatin (LIPITOR) 20 MG tablet Take 10 mg by mouth at bedtime.     Cholecalciferol (VITAMIN D3 PO) Take 1 tablet by mouth every morning.     diclofenac Sodium (VOLTAREN) 1 % GEL Apply 2 g topically 4 (four) times daily as needed (pain).     feeding supplement (ENSURE ENLIVE / ENSURE PLUS) LIQD Take 237 mLs by mouth 2 (two) times daily between meals. 237 mL 12   flecainide  (TAMBOCOR) 100 MG tablet Take 100 mg by mouth every 12 (twelve) hours.     fluticasone (FLONASE) 50 MCG/ACT nasal spray Place 1 spray into both nostrils daily as needed for allergies or rhinitis.     hydroxypropyl methylcellulose / hypromellose (ISOPTO TEARS / GONIOVISC) 2.5 % ophthalmic solution Place 2 drops into both eyes 2 (two) times daily as needed for dry eyes.     lidocaine-prilocaine (EMLA) cream Apply to affected area once 30 g 3   LORazepam (ATIVAN) 0.5 MG tablet Take 1 tablet (0.5 mg total) by mouth every 6 (six) hours as needed (Nausea or vomiting). 30 tablet 0   metoprolol tartrate (LOPRESSOR) 25 MG tablet Take 12.5 mg by mouth 2 (two) times daily. Take with flecainide     mirtazapine (REMERON) 30 MG tablet Take 30 mg by mouth at bedtime.     omeprazole (PRILOSEC) 20 MG capsule Take 20 mg by mouth daily before breakfast.     ondansetron (ZOFRAN) 8 MG tablet Take 1 tablet (8 mg total) by mouth every 8 (eight) hours as needed for nausea or vomiting. Start on the third day after cyclophosphamide chemotherapy. 30 tablet 1   potassium chloride SA (KLOR-CON M) 20 MEQ tablet Take 1 tablet (20 mEq total) by mouth 2 (two) times daily. 60 tablet 0   prazosin (MINIPRESS) 2 MG capsule Take 6 mg by mouth at bedtime.     predniSONE (DELTASONE) 20 MG tablet Take 3 tablets (60 mg total) by mouth daily. Take with food on days 1-5 of chemotherapy. 25 tablet 5   prochlorperazine (COMPAZINE) 10 MG tablet Take 1 tablet (10 mg total) by mouth every 6 (six) hours as needed for nausea or vomiting. 30 tablet 6   terbinafine (LAMISIL) 1 % cream Apply 1 application topically 2 (two) times daily as needed (athlete's foot).     torsemide (DEMADEX) 10 MG tablet Take 20 mg by mouth daily.     triamcinolone cream (KENALOG) 0.1 % Apply 1 application topically 2 (two) times daily as needed (rash).     No current facility-administered medications for this visit.    REVIEW OF SYSTEMS:   10 Point review of Systems was  done is negative except as noted above.  PHYSICAL EXAMINATION: .BP 120/68   Pulse 73   Temp 97.9 F (36.6 C)   Resp 18   Wt 195 lb 4.8 oz (88.6 kg)   SpO2 100%   BMI 23.77 kg/m  NAD GENERAL:alert, in no acute distress and comfortable SKIN: no acute rashes, no significant lesions EYES: conjunctiva are pink and non-injected, sclera anicteric OROPHARYNX: MMM, no exudates, no oropharyngeal erythema or ulceration NECK: supple, no JVD LYMPH: Significant new palpable lymphadenopathy in the cervical axillary areas as well as the inguinal areas LUNGS: clear to auscultation b/l with normal respiratory  effort HEART: regular rate & rhythm ABDOMEN:  normoactive bowel sounds , non tender, not distended. Extremity: no pedal edema PSYCH: alert & oriented x 3 with fluent speech NEURO: no focal motor/sensory deficits   LABORATORY DATA:  I have reviewed the data as listed  .    Latest Ref Rng & Units 11/21/2022   10:04 AM 11/07/2022   11:44 AM 10/26/2022   11:10 AM  CBC  WBC 4.0 - 10.5 K/uL 4.6  4.9  4.5   Hemoglobin 13.0 - 17.0 g/dL 8.7  8.5  9.4   Hematocrit 39.0 - 52.0 % 26.9  26.4  29.6   Platelets 150 - 400 K/uL 154  149  174   ANC 700     Latest Ref Rng & Units 11/21/2022   10:04 AM 11/07/2022   11:44 AM 10/26/2022   11:10 AM  CMP  Glucose 70 - 99 mg/dL 83  85  98   BUN 8 - 23 mg/dL '17  18  19   '$ Creatinine 0.61 - 1.24 mg/dL 1.04  0.93  1.09   Sodium 135 - 145 mmol/L 143  143  141   Potassium 3.5 - 5.1 mmol/L 3.6  3.2  3.3   Chloride 98 - 111 mmol/L 108  109  105   CO2 22 - 32 mmol/L '29  31  28   '$ Calcium 8.9 - 10.3 mg/dL 9.3  8.8  9.6   Total Protein 6.5 - 8.1 g/dL 6.0  5.8  7.4   Total Bilirubin 0.3 - 1.2 mg/dL 0.3  0.4  0.5   Alkaline Phos 38 - 126 U/L 74  61  78   AST 15 - 41 U/L '13  15  26   '$ ALT 0 - 44 U/L '9  11  14   '$ . Lab Results  Component Value Date   LDH 151 05/25/2022    . Uric acid 4.9 Surgical Pathology  CASE: [id number]  PATIENT: Jerry Tran  Flow  Pathology Report  Clinical history: Mantle Cell Lymphoma of lymph nodes of multiple  regions  DIAGNOSIS:  -Monoclonal B-cell population identified  -See comment   COMMENT:   The findings are consistent with involvement by previously known mantle  cell lymphoma.    RADIOGRAPHIC STUDIES: I have personally reviewed the radiological images as listed and agreed with the findings in the report.  IR IMAGING GUIDED PORT INSERTION  Result Date: 11/06/2022 INDICATION: 77 year old male with mantle cell lymphoma. He requires durable venous access prior to initiating chemotherapy and presents for port catheter placement. EXAM: IMPLANTED PORT A CATH PLACEMENT WITH ULTRASOUND AND FLUOROSCOPIC GUIDANCE MEDICATIONS: None. ANESTHESIA/SEDATION: Versed 1 mg IV; Fentanyl 50 mcg IV; Moderate Sedation Time:  19 minutes The patient's vital signs and level of consciousness were continuously monitored during the procedure by the interventional radiology nurse under my direct supervision. FLUOROSCOPY: Radiation exposure index: 2 mGy reference air kerma COMPLICATIONS: None immediate. PROCEDURE: The right neck and chest was prepped with chlorhexidine, and draped in the usual sterile fashion using maximum barrier technique (cap and mask, sterile gown, sterile gloves, large sterile sheet, hand hygiene and cutaneous antiseptic). Local anesthesia was attained by infiltration with 1% lidocaine with epinephrine. Ultrasound demonstrated patency of the right internal jugular vein, and this was documented with an image. Under real-time ultrasound guidance, this vein was accessed with a 21 gauge micropuncture needle and image documentation was performed. A small dermatotomy was made at the access site with an 11 scalpel. A 0.018" wire was  advanced into the SVC and the access needle exchanged for a 85F micropuncture vascular sheath. The 0.018" wire was then removed and a 0.035" wire advanced into the IVC. An appropriate location for the  subcutaneous reservoir was selected below the clavicle and an incision was made through the skin and underlying soft tissues. The subcutaneous tissues were then dissected using a combination of blunt and sharp surgical technique and a pocket was formed. A single lumen power injectable portacatheter was then tunneled through the subcutaneous tissues from the pocket to the dermatotomy and the port reservoir placed within the subcutaneous pocket. The venous access site was then serially dilated and a peel away vascular sheath placed over the wire. The wire was removed and the port catheter advanced into position under fluoroscopic guidance. The catheter tip is positioned in the superior cavoatrial junction. This was documented with a spot image. The portacatheter was then tested and found to flush and aspirate well. The port was flushed with saline followed by 100 units/mL heparinized saline. The pocket was then closed in two layers using first subdermal inverted interrupted absorbable sutures followed by a running subcuticular suture. The epidermis was then sealed with Dermabond. The dermatotomy at the venous access site was also closed with Dermabond. IMPRESSION: Successful placement of a right IJ approach Power Port with ultrasound and fluoroscopic guidance. The catheter is ready for use. Electronically Signed   By: Jacqulynn Cadet M.D.   On: 11/06/2022 13:29   ECHOCARDIOGRAM COMPLETE  Result Date: 10/25/2022    ECHOCARDIOGRAM REPORT   Patient Name:   ELLIOT MELDRUM Date of Exam: 10/25/2022 Medical Rec #:  [id number]    Height:       76.0 in Accession #:    [id number]   Weight:       202.2 lb Date of Birth:  October 17, 1946    BSA:          2.225 m Patient Age:    45 years     BP:           122/70 mmHg Patient Gender: M            HR:           66 bpm. Exam Location:  Outpatient Procedure: 2D Echo, 3D Echo, Cardiac Doppler, Color Doppler and Strain Analysis Indications:    Z51.11 Encounter for antineoplastic  chemotheraphy  History:        Patient has prior history of Echocardiogram examinations, most                 recent 12/10/2021. Abnormal ECG, Arrythmias:Atrial Fibrillation;                 Risk Factors:Hypertension and Dyslipidemia. Lymphoma.  Sonographer:    Roseanna Rainbow RDCS Referring Phys: [id number] [place]  1. Left ventricular ejection fraction, by estimation, is 60 to 65%. Left ventricular ejection fraction by 3D volume is 63 %. The left ventricle has normal function. The left ventricle has no regional wall motion abnormalities. Left ventricular diastolic  parameters are consistent with Grade I diastolic dysfunction (impaired relaxation). The average left ventricular global longitudinal strain is -23.3 %. The global longitudinal strain is normal.  2. Right ventricular systolic function is normal. The right ventricular size is normal. There is normal pulmonary artery systolic pressure. The estimated right ventricular systolic pressure is 76.7 mmHg.  3. There is no evidence of cardiac tamponade.  4. The mitral valve is grossly normal. Trivial mitral valve regurgitation.  5. The aortic valve is tricuspid. Aortic valve regurgitation is not visualized. No aortic stenosis is present.  6. The inferior vena cava is normal in size with greater than 50% respiratory variability, suggesting right atrial pressure of 3 mmHg.  7. Small PFO suspected with left to right flow. Comparison(s): Changes from prior study are noted. 12/10/2021: LVEF 55-60%, RVSP 45.7 mmHg. FINDINGS  Left Ventricle: Left ventricular ejection fraction, by estimation, is 60 to 65%. Left ventricular ejection fraction by 3D volume is 63 %. The left ventricle has normal function. The left ventricle has no regional wall motion abnormalities. The average left ventricular global longitudinal strain is -23.3 %. The global longitudinal strain is normal. The left ventricular internal cavity size was normal in size. There is no left ventricular  hypertrophy. Left ventricular diastolic parameters are consistent  with Grade I diastolic dysfunction (impaired relaxation). Normal left ventricular filling pressure. Right Ventricle: The right ventricular size is normal. No increase in right ventricular wall thickness. Right ventricular systolic function is normal. There is normal pulmonary artery systolic pressure. The tricuspid regurgitant velocity is 2.46 m/s, and  with an assumed right atrial pressure of 3 mmHg, the estimated right ventricular systolic pressure is 27.0 mmHg. Left Atrium: Left atrial size was normal in size. Right Atrium: Right atrial size was normal in size. Pericardium: Trivial pericardial effusion is present. The pericardial effusion is circumferential. There is no evidence of cardiac tamponade. Mitral Valve: The mitral valve is grossly normal. Trivial mitral valve regurgitation. Tricuspid Valve: The tricuspid valve is grossly normal. Tricuspid valve regurgitation is mild. Aortic Valve: The aortic valve is tricuspid. Aortic valve regurgitation is not visualized. No aortic stenosis is present. Pulmonic Valve: The pulmonic valve was normal in structure. Pulmonic valve regurgitation is not visualized. Aorta: The aortic root and ascending aorta are structurally normal, with no evidence of dilitation. Venous: The inferior vena cava is normal in size with greater than 50% respiratory variability, suggesting right atrial pressure of 3 mmHg. IAS/Shunts: The interatrial septum is aneurysmal. Small PFO suspected with left to right flow.  LEFT VENTRICLE PLAX 2D LVIDd:         5.30 cm         Diastology LVIDs:         3.20 cm         LV e' medial:    6.96 cm/s LV PW:         1.00 cm         LV E/e' medial:  9.7 LV IVS:        1.00 cm         LV e' lateral:   10.80 cm/s LVOT diam:     2.20 cm         LV E/e' lateral: 6.3 LV SV:         90 LV SV Index:   40              2D LVOT Area:     3.80 cm        Longitudinal                                Strain                                 2D Strain GLS  -22.6 % LV Volumes (MOD)               (  A2C): LV vol d, MOD    129.0 ml      2D Strain GLS  -24.5 % A2C:                           (A3C): LV vol d, MOD    120.0 ml      2D Strain GLS  -23.0 % A4C:                           (A4C): LV vol s, MOD    34.5 ml       2D Strain GLS  -23.3 % A2C:                           Avg: LV vol s, MOD    46.2 ml A4C:                           3D Volume EF LV SV MOD A2C:   94.5 ml       LV 3D EF:    Left LV SV MOD A4C:   120.0 ml                   ventricul LV SV MOD BP:    88.6 ml                    ar                                             ejection                                             fraction                                             by 3D                                             volume is                                             63 %.                                 3D Volume EF:                                3D EF:        63 %                                LV EDV:  163 ml                                LV ESV:       61 ml                                LV SV:        102 ml RIGHT VENTRICLE             IVC RV S prime:     17.10 cm/s  IVC diam: 2.10 cm TAPSE (M-mode): 2.4 cm LEFT ATRIUM             Index        RIGHT ATRIUM           Index LA diam:        3.60 cm 1.62 cm/m   RA Area:     21.50 cm LA Vol (A2C):   70.7 ml 31.77 ml/m  RA Volume:   65.50 ml  29.43 ml/m LA Vol (A4C):   71.4 ml 32.08 ml/m LA Biplane Vol: 74.5 ml 33.48 ml/m  AORTIC VALVE LVOT Vmax:   129.00 cm/s LVOT Vmean:  82.400 cm/s LVOT VTI:    0.237 m  AORTA Ao Root diam: 3.30 cm Ao Asc diam:  3.30 cm MITRAL VALVE               TRICUSPID VALVE MV Area (PHT): 2.93 cm    TR Peak grad:   24.2 mmHg MV Decel Time: 259 msec    TR Vmax:        246.00 cm/s MV E velocity: 67.70 cm/s MV A velocity: 71.75 cm/s  SHUNTS MV E/A ratio:  0.94        Systemic VTI:  0.24 m                            Systemic Diam: 2.20 cm Lyman Bishop MD Electronically signed  by Lyman Bishop MD Signature Date/Time: 10/25/2022/9:48:06 PM    Final     ASSESSMENT & PLAN:   77 year old male with history of hypertension, dyslipidemia, paroxysmal atrial fibrillation on flecainide not on anticoagulation, recent Helicobacter pylori infection, history of prostate cancer treated with radiation therapy and 1 dose of Lupron in June 2022 with  1) recently diagnosed stage IV mantle cell lymphoma With extensive lymphadenopathy and massive splenomegaly Likely bone marrow involvement-peripheral blood flow cytometry positive for mantle cell lymphoma. PET CT scan 12/29/2021  1. Enlarged hypermetabolic cervical, bilateral axillary, retroperitoneal, pelvic, and inguinal lymph nodes. [place] 4. 2. Subcentimeter mediastinal lymph nodes with uptake similar to mediastinal blood pool. 3. Focal hypermetabolic uptake seen at the head of the right clavicle, concerning for osseous involvement. 4. Splenomegaly. 5. Increased tonsillar soft tissue, right greater than left, concerning for lymphomatous involvement.  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  3) moderate to severe protein calorie malnutrition-much improved patient is gaining weight and has been eating much better. . Wt Readings from Last 3 Encounters:  11/07/22 202 lb 12 oz (92 kg)  10/26/22 198 lb 3.2 oz (89.9 kg)  10/16/22 202 lb 2.6 oz (91.7 kg)     4) acute renal injury-likely due poor p.o. intake.  Due to spontaneous tumor lysis.  Also could be due to his hematuria and some element of urinary obstruction.  Now resolved  5) massive splenomegaly due to mantle cell lymphoma.  Now resolved on repeat imaging-  6) status post tumor lysis syndrome  PLAN: -Discussed lab results from today, 11/21/2022, with the patient. CBC showed hemoglobin of 8.7. Patient is anemic. CMP is stable.  -patient shows significant  clinical response and no notable toxicity after 1st cycle of R-CHOP -recommended wearing compression socks.  -Referral to Orthopedics.  -Patient can continue the treatment without any dose modifications.  -Recommended RSV vaccine and stay up to speed with other age appropriate vaccines.  -Answered all of patient's questions.  -chemo-immunotherapy orders reviewed and signed.  FOLLOW-UP: F/u as scheduled for C2 of R-CHOP Referral to orthopedics for locked left knee  The total time spent in the appointment was 30 minutes* .  All of the patient's questions were answered with apparent satisfaction. The patient knows to call the clinic with any problems, questions or concerns.   Sullivan Lone MD MS AAHIVMS [facility] Hematology/Oncology Physician [facility]  .*Total Encounter Time as defined by the Centers for Medicare and Medicaid Services includes, in addition to the face-to-face time of a patient visit (documented in the note above) non-face-to-face time: obtaining and reviewing outside history, ordering and reviewing medications, tests or procedures, care coordination (communications with other health care professionals or caregivers) and documentation in the medical record.   I, Cleda Mccreedy, am acting as a Education administrator for Sullivan Lone, MD.  .I have reviewed the above documentation for accuracy and completeness, and I agree with the above. Brunetta Genera MD

## 2022-11-27 ENCOUNTER — Encounter: Payer: Self-pay | Admitting: Hematology

## 2022-11-27 MED FILL — Fosaprepitant Dimeglumine For IV Infusion 150 MG (Base Eq): INTRAVENOUS | Qty: 5 | Status: AC

## 2022-11-27 MED FILL — Dexamethasone Sodium Phosphate Inj 100 MG/10ML: INTRAMUSCULAR | Qty: 1 | Status: AC

## 2022-11-28 ENCOUNTER — Inpatient Hospital Stay (HOSPITAL_BASED_OUTPATIENT_CLINIC_OR_DEPARTMENT_OTHER): Payer: Medicare Other | Admitting: Hematology

## 2022-11-28 ENCOUNTER — Telehealth: Payer: Self-pay | Admitting: Hematology

## 2022-11-28 ENCOUNTER — Other Ambulatory Visit: Payer: Self-pay

## 2022-11-28 ENCOUNTER — Inpatient Hospital Stay: Payer: Medicare Other

## 2022-11-28 VITALS — BP 139/87 | HR 60 | Temp 97.9°F | Resp 16 | Wt 192.5 lb

## 2022-11-28 DIAGNOSIS — Z7189 Other specified counseling: Secondary | ICD-10-CM

## 2022-11-28 DIAGNOSIS — C8318 Mantle cell lymphoma, lymph nodes of multiple sites: Secondary | ICD-10-CM | POA: Diagnosis not present

## 2022-11-28 DIAGNOSIS — E883 Tumor lysis syndrome: Secondary | ICD-10-CM

## 2022-11-28 DIAGNOSIS — Z5112 Encounter for antineoplastic immunotherapy: Secondary | ICD-10-CM | POA: Diagnosis not present

## 2022-11-28 LAB — CBC WITH DIFFERENTIAL (CANCER CENTER ONLY)
Abs Immature Granulocytes: 0.06 10*3/uL (ref 0.00–0.07)
Basophils Absolute: 0 10*3/uL (ref 0.0–0.1)
Basophils Relative: 0 %
Eosinophils Absolute: 0 10*3/uL (ref 0.0–0.5)
Eosinophils Relative: 0 %
HCT: 28.4 % — ABNORMAL LOW (ref 39.0–52.0)
Hemoglobin: 9.4 g/dL — ABNORMAL LOW (ref 13.0–17.0)
Immature Granulocytes: 1 %
Lymphocytes Relative: 11 %
Lymphs Abs: 0.7 10*3/uL (ref 0.7–4.0)
MCH: 27.5 pg (ref 26.0–34.0)
MCHC: 33.1 g/dL (ref 30.0–36.0)
MCV: 83 fL (ref 80.0–100.0)
Monocytes Absolute: 0.1 10*3/uL (ref 0.1–1.0)
Monocytes Relative: 2 %
Neutro Abs: 5.4 10*3/uL (ref 1.7–7.7)
Neutrophils Relative %: 86 %
Platelet Count: 241 10*3/uL (ref 150–400)
RBC: 3.42 MIL/uL — ABNORMAL LOW (ref 4.22–5.81)
RDW: 16.2 % — ABNORMAL HIGH (ref 11.5–15.5)
WBC Count: 6.2 10*3/uL (ref 4.0–10.5)
nRBC: 0 % (ref 0.0–0.2)

## 2022-11-28 LAB — CMP (CANCER CENTER ONLY)
ALT: 9 U/L (ref 0–44)
AST: 13 U/L — ABNORMAL LOW (ref 15–41)
Albumin: 3.8 g/dL (ref 3.5–5.0)
Alkaline Phosphatase: 66 U/L (ref 38–126)
Anion gap: 8 (ref 5–15)
BUN: 28 mg/dL — ABNORMAL HIGH (ref 8–23)
CO2: 26 mmol/L (ref 22–32)
Calcium: 9.8 mg/dL (ref 8.9–10.3)
Chloride: 106 mmol/L (ref 98–111)
Creatinine: 1.01 mg/dL (ref 0.61–1.24)
GFR, Estimated: >60 mL/min (ref >60)
Glucose, Bld: 154 mg/dL — ABNORMAL HIGH (ref 70–99)
Potassium: 3.5 mmol/L (ref 3.5–5.1)
Sodium: 140 mmol/L (ref 135–145)
Total Bilirubin: 0.3 mg/dL (ref 0.3–1.2)
Total Protein: 7.1 g/dL (ref 6.5–8.1)

## 2022-11-28 MED ORDER — DOXORUBICIN HCL CHEMO IV INJECTION 2 MG/ML
25.0000 mg/m2 | Freq: Once | INTRAVENOUS | Status: AC
  Administered 2022-11-28: 56 mg via INTRAVENOUS
  Filled 2022-11-28: qty 28

## 2022-11-28 MED ORDER — SODIUM CHLORIDE 0.9% FLUSH
10.0000 mL | INTRAVENOUS | Status: DC | PRN
  Administered 2022-11-28: 10 mL

## 2022-11-28 MED ORDER — PALONOSETRON HCL INJECTION 0.25 MG/5ML
0.2500 mg | Freq: Once | INTRAVENOUS | Status: AC
  Administered 2022-11-28: 0.25 mg via INTRAVENOUS
  Filled 2022-11-28: qty 5

## 2022-11-28 MED ORDER — HEPARIN SOD (PORK) LOCK FLUSH 100 UNIT/ML IV SOLN
500.0000 [IU] | Freq: Once | INTRAVENOUS | Status: AC | PRN
  Administered 2022-11-28: 500 [IU]

## 2022-11-28 MED ORDER — FAMOTIDINE IN NACL 20-0.9 MG/50ML-% IV SOLN
20.0000 mg | Freq: Once | INTRAVENOUS | Status: AC
  Administered 2022-11-28: 20 mg via INTRAVENOUS
  Filled 2022-11-28: qty 50

## 2022-11-28 MED ORDER — SODIUM CHLORIDE 0.9 % IV SOLN: Freq: Once | INTRAVENOUS | Status: AC

## 2022-11-28 MED ORDER — SODIUM CHLORIDE 0.9 % IV SOLN
375.0000 mg/m2 | Freq: Once | INTRAVENOUS | Status: AC
  Administered 2022-11-28: 800 mg via INTRAVENOUS
  Filled 2022-11-28: qty 50

## 2022-11-28 MED ORDER — SODIUM CHLORIDE 0.9% FLUSH
10.0000 mL | Freq: Once | INTRAVENOUS | Status: AC
  Administered 2022-11-28: 10 mL

## 2022-11-28 MED ORDER — DIPHENHYDRAMINE HCL 25 MG PO CAPS
50.0000 mg | ORAL_CAPSULE | Freq: Once | ORAL | Status: AC
  Administered 2022-11-28: 50 mg via ORAL
  Filled 2022-11-28: qty 2

## 2022-11-28 MED ORDER — SODIUM CHLORIDE 0.9 % IV SOLN
10.0000 mg | Freq: Once | INTRAVENOUS | Status: AC
  Administered 2022-11-28: 10 mg via INTRAVENOUS
  Filled 2022-11-28: qty 10

## 2022-11-28 MED ORDER — ACETAMINOPHEN 325 MG PO TABS
650.0000 mg | ORAL_TABLET | Freq: Once | ORAL | Status: AC
  Administered 2022-11-28: 650 mg via ORAL
  Filled 2022-11-28: qty 2

## 2022-11-28 MED ORDER — VINCRISTINE SULFATE CHEMO INJECTION 1 MG/ML
1.0000 mg | Freq: Once | INTRAVENOUS | Status: AC
  Administered 2022-11-28: 1 mg via INTRAVENOUS
  Filled 2022-11-28: qty 1

## 2022-11-28 MED ORDER — SODIUM CHLORIDE 0.9 % IV SOLN
150.0000 mg | Freq: Once | INTRAVENOUS | Status: AC
  Administered 2022-11-28: 150 mg via INTRAVENOUS
  Filled 2022-11-28: qty 150

## 2022-11-28 MED ORDER — SODIUM CHLORIDE 0.9 % IV SOLN
400.0000 mg/m2 | Freq: Once | INTRAVENOUS | Status: AC
  Administered 2022-11-28: 880 mg via INTRAVENOUS
  Filled 2022-11-28: qty 44

## 2022-11-28 NOTE — Patient Instructions (Addendum)
Jerry Tran  Discharge Instructions: Thank you for choosing [facility] to provide your oncology and hematology care.   If you have a lab appointment with the [facility], please go directly to the [facility] and check in at the registration area.   Wear comfortable clothing and clothing appropriate for easy access to any Portacath or PICC line.   We strive to give you quality time with your provider. You may need to reschedule your appointment if you arrive late (15 or more minutes).  Arriving late affects you and other patients whose appointments are after yours.  Also, if you miss three or more appointments without notifying the office, you may be dismissed from the clinic at the provider's discretion.      For prescription refill requests, have your pharmacy contact our office and allow 72 hours for refills to be completed.    Today you received the following chemotherapy and/or immunotherapy agents: Adriamycin/Vincristine/Cytoxan/Truxima   To help prevent nausea and vomiting after your treatment, we encourage you to take your nausea medication as directed.  BELOW ARE SYMPTOMS THAT SHOULD BE REPORTED IMMEDIATELY: *FEVER GREATER THAN 100.4 F (38 C) OR HIGHER *CHILLS OR SWEATING *NAUSEA AND VOMITING THAT IS NOT CONTROLLED WITH YOUR NAUSEA MEDICATION *UNUSUAL SHORTNESS OF BREATH *UNUSUAL BRUISING OR BLEEDING *URINARY PROBLEMS (pain or burning when urinating, or frequent urination) *BOWEL PROBLEMS (unusual diarrhea, constipation, pain near the anus) TENDERNESS IN MOUTH AND THROAT WITH OR WITHOUT PRESENCE OF ULCERS (sore throat, sores in mouth, or a toothache) UNUSUAL RASH, SWELLING OR PAIN  UNUSUAL VAGINAL DISCHARGE OR ITCHING   Items with * indicate a potential emergency and should be followed up as soon as possible or go to the Emergency Department if any problems should occur.  Please show the CHEMOTHERAPY ALERT CARD or  IMMUNOTHERAPY ALERT CARD at check-in to the Emergency Department and triage nurse.  Should you have questions after your visit or need to cancel or reschedule your appointment, please contact [facility]  Dept: [phone]  and follow the prompts.  Office hours are 8:00 a.m. to 4:30 p.m. Monday - Friday. Please note that voicemails left after 4:00 p.m. may not be returned until the following business day.  We are closed weekends and major holidays. You have access to a nurse at all times for urgent questions. Please call the main number to the clinic Dept: [phone] and follow the prompts.   For any non-urgent questions, you may also contact your provider using MyChart. We now offer e-Visits for anyone 74 and older to request care online for non-urgent symptoms. For details visit [email or web address].   Also download the MyChart app! Go to the app store, search "MyChart", open the app, select [facility], and log in with your MyChart username and password.

## 2022-11-28 NOTE — Progress Notes (Signed)
HEMATOLOGY/ONCOLOGY CLINIC VISIT NOTE  Date of Service: 11/28/22    Patient Care Team: [facility] [place] as PCP - General (General Practice)  CHIEF COMPLAINTS/PURPOSE OF CONSULTATION:  Follow-up for continued evaluation and management of relapsed mantle cell lymphoma  HISTORY OF PRESENTING ILLNESS:  Please see previous note for details of initial presentation  INTERVAL HISTORY  Mr. Grieco is here for continued evaluation and management of his relapsed mantle cell lymphoma. He is here to start cycle 2 day 1 of R-CHOP.  Patient was last seen by me on 11/21/2022 and he was doing well overall. He did complain of mild chills, mild bilateral leg swelling, skin rash throughout the body, and left locked knee.   Patient is accompanied by his wife during this visit. He reports he has been doing well overall without any new medical concerns since our last visit.   Patient denies fever, chills, unexpected weight loss, fatigue, skin rash, enlarged lymph nodes, bone pain, back pain, abdominal pain, chest pain. However, he complains of mild bilateral leg swelling due to problems with his knees. His wife reports that the patient had one episode of night sweat yesterday night.   He notes that his left locked knee is improving after receiving steroid shot at the [facility].    MEDICAL HISTORY:  Past Medical History:  Diagnosis Date   Arthritis    Chronic kidney disease, stage 3b ([place]) 12/09/2021   Dyslipidemia    GERD (gastroesophageal reflux disease)    Hypertension    PAF (paroxysmal atrial fibrillation) (HCC)    Prostate cancer ([place]) 03/2021   s/p radiation therapy   PTSD (post-traumatic stress disorder)     SURGICAL HISTORY: Past Surgical History:  Procedure Laterality Date   IR IMAGING GUIDED PORT INSERTION  11/06/2022   LIPOMA EXCISION Right 12/09/2021   Procedure: RIGHT SUPERCLAVICULAR LYMPH NODE EXCISION;  Surgeon: Ileana Roup, MD;  Location: [facility] OR;  Service:  General;  Laterality: Right;    SOCIAL HISTORY: Social History   Socioeconomic History   Marital status: Married    Spouse name: Not on file   Number of children: Not on file   Years of education: Not on file   Highest education level: Not on file  Occupational History   Occupation: retired  Tobacco Use   Smoking status: Former    Packs/day: 1.00    Years: 15.00    Total pack years: 15.00    Types: Cigarettes   Smokeless tobacco: Never  Substance and Sexual Activity   Alcohol use: Not Currently    Comment: h/o heavy use   Drug use: Not Currently    Types: Marijuana, Cocaine    Comment: remote   Sexual activity: Not on file  Other Topics Concern   Not on file  Social History Narrative   Not on file   Social Determinants of Health   Financial Resource Strain: Not on file  Food Insecurity: Not on file  Transportation Needs: Not on file  Physical Activity: Not on file  Stress: Not on file  Social Connections: Not on file  Intimate Partner Violence: Not on file    FAMILY HISTORY: Family History  Problem Relation Age of Onset   Cancer Neg Hx     ALLERGIES:  is allergic to sildenafil.  MEDICATIONS:  Current Outpatient Medications  Medication Sig Dispense Refill   acyclovir (ZOVIRAX) 400 MG tablet Take 1 tablet (400 mg total) by mouth daily. 30 tablet 3   allopurinol (  ZYLOPRIM) 100 MG tablet Take 1 tablet (100 mg total) by mouth 2 (two) times daily. 60 tablet 0   ammonium lactate (LAC-HYDRIN) 12 % lotion Apply 1 application topically 2 (two) times daily as needed for dry skin.     atorvastatin (LIPITOR) 20 MG tablet Take 10 mg by mouth at bedtime.     Cholecalciferol (VITAMIN D3 PO) Take 1 tablet by mouth every morning.     diclofenac Sodium (VOLTAREN) 1 % GEL Apply 2 g topically 4 (four) times daily as needed (pain).     feeding supplement (ENSURE ENLIVE / ENSURE PLUS) LIQD Take 237 mLs by mouth 2 (two) times daily between meals. 237 mL 12   flecainide  (TAMBOCOR) 100 MG tablet Take 100 mg by mouth every 12 (twelve) hours.     fluticasone (FLONASE) 50 MCG/ACT nasal spray Place 1 spray into both nostrils daily as needed for allergies or rhinitis.     hydroxypropyl methylcellulose / hypromellose (ISOPTO TEARS / GONIOVISC) 2.5 % ophthalmic solution Place 2 drops into both eyes 2 (two) times daily as needed for dry eyes.     lidocaine-prilocaine (EMLA) cream Apply to affected area once 30 g 3   LORazepam (ATIVAN) 0.5 MG tablet Take 1 tablet (0.5 mg total) by mouth every 6 (six) hours as needed (Nausea or vomiting). 30 tablet 0   metoprolol tartrate (LOPRESSOR) 25 MG tablet Take 12.5 mg by mouth 2 (two) times daily. Take with flecainide     mirtazapine (REMERON) 30 MG tablet Take 30 mg by mouth at bedtime.     omeprazole (PRILOSEC) 20 MG capsule Take 20 mg by mouth daily before breakfast.     ondansetron (ZOFRAN) 8 MG tablet Take 1 tablet (8 mg total) by mouth every 8 (eight) hours as needed for nausea or vomiting. Start on the third day after cyclophosphamide chemotherapy. 30 tablet 1   potassium chloride SA (KLOR-CON M) 20 MEQ tablet Take 1 tablet (20 mEq total) by mouth 2 (two) times daily. 60 tablet 0   prazosin (MINIPRESS) 2 MG capsule Take 6 mg by mouth at bedtime.     predniSONE (DELTASONE) 20 MG tablet Take 3 tablets (60 mg total) by mouth daily. Take with food on days 1-5 of chemotherapy. 25 tablet 5   prochlorperazine (COMPAZINE) 10 MG tablet Take 1 tablet (10 mg total) by mouth every 6 (six) hours as needed for nausea or vomiting. 30 tablet 6   terbinafine (LAMISIL) 1 % cream Apply 1 application topically 2 (two) times daily as needed (athlete's foot).     torsemide (DEMADEX) 10 MG tablet Take 20 mg by mouth daily.     triamcinolone cream (KENALOG) 0.1 % Apply 1 application topically 2 (two) times daily as needed (rash).     No current facility-administered medications for this visit.    REVIEW OF SYSTEMS:   10 Point review of Systems was  done is negative except as noted above.  PHYSICAL EXAMINATION: Vs stable NAD GENERAL:alert, in no acute distress and comfortable SKIN: no acute rashes, no significant lesions EYES: conjunctiva are pink and non-injected, sclera anicteric OROPHARYNX: MMM, no exudates, no oropharyngeal erythema or ulceration NECK: supple, no JVD LYMPH: Significant new palpable lymphadenopathy in the cervical axillary areas as well as the inguinal areas LUNGS: clear to auscultation b/l with normal respiratory effort HEART: regular rate & rhythm ABDOMEN:  normoactive bowel sounds , non tender, not distended. Extremity: no pedal edema PSYCH: alert & oriented x 3 with fluent speech NEURO:  no focal motor/sensory deficits   LABORATORY DATA:  I have reviewed the data as listed  .    Latest Ref Rng & Units 11/28/2022   10:08 AM 11/21/2022   10:04 AM 11/07/2022   11:44 AM  CBC  WBC 4.0 - 10.5 K/uL 6.2  4.6  4.9   Hemoglobin 13.0 - 17.0 g/dL 9.4  8.7  8.5   Hematocrit 39.0 - 52.0 % 28.4  26.9  26.4   Platelets 150 - 400 K/uL 241  154  149   ANC 700     Latest Ref Rng & Units 11/21/2022   10:04 AM 11/07/2022   11:44 AM 10/26/2022   11:10 AM  CMP  Glucose 70 - 99 mg/dL 83  85  98   BUN 8 - 23 mg/dL '17  18  19   '$ Creatinine 0.61 - 1.24 mg/dL 1.04  0.93  1.09   Sodium 135 - 145 mmol/L 143  143  141   Potassium 3.5 - 5.1 mmol/L 3.6  3.2  3.3   Chloride 98 - 111 mmol/L 108  109  105   CO2 22 - 32 mmol/L '29  31  28   '$ Calcium 8.9 - 10.3 mg/dL 9.3  8.8  9.6   Total Protein 6.5 - 8.1 g/dL 6.0  5.8  7.4   Total Bilirubin 0.3 - 1.2 mg/dL 0.3  0.4  0.5   Alkaline Phos 38 - 126 U/L 74  61  78   AST 15 - 41 U/L '13  15  26   '$ ALT 0 - 44 U/L '9  11  14   '$ . Lab Results  Component Value Date   LDH 151 05/25/2022    . Uric acid 4.9 Surgical Pathology  CASE: [id number]  PATIENT: Rawlins Burges  Flow Pathology Report  Clinical history: Mantle Cell Lymphoma of lymph nodes of multiple  regions  DIAGNOSIS:   -Monoclonal B-cell population identified  -See comment   COMMENT:   The findings are consistent with involvement by previously known mantle  cell lymphoma.    RADIOGRAPHIC STUDIES: I have personally reviewed the radiological images as listed and agreed with the findings in the report.  IR IMAGING GUIDED PORT INSERTION  Result Date: 11/06/2022 INDICATION: 77 year old male with mantle cell lymphoma. He requires durable venous access prior to initiating chemotherapy and presents for port catheter placement. EXAM: IMPLANTED PORT A CATH PLACEMENT WITH ULTRASOUND AND FLUOROSCOPIC GUIDANCE MEDICATIONS: None. ANESTHESIA/SEDATION: Versed 1 mg IV; Fentanyl 50 mcg IV; Moderate Sedation Time:  19 minutes The patient's vital signs and level of consciousness were continuously monitored during the procedure by the interventional radiology nurse under my direct supervision. FLUOROSCOPY: Radiation exposure index: 2 mGy reference air kerma COMPLICATIONS: None immediate. PROCEDURE: The right neck and chest was prepped with chlorhexidine, and draped in the usual sterile fashion using maximum barrier technique (cap and mask, sterile gown, sterile gloves, large sterile sheet, hand hygiene and cutaneous antiseptic). Local anesthesia was attained by infiltration with 1% lidocaine with epinephrine. Ultrasound demonstrated patency of the right internal jugular vein, and this was documented with an image. Under real-time ultrasound guidance, this vein was accessed with a 21 gauge micropuncture needle and image documentation was performed. A small dermatotomy was made at the access site with an 11 scalpel. A 0.018" wire was advanced into the SVC and the access needle exchanged for a 56F micropuncture vascular sheath. The 0.018" wire was then removed and a 0.035" wire advanced into the IVC. An  appropriate location for the subcutaneous reservoir was selected below the clavicle and an incision was made through the skin and  underlying soft tissues. The subcutaneous tissues were then dissected using a combination of blunt and sharp surgical technique and a pocket was formed. A single lumen power injectable portacatheter was then tunneled through the subcutaneous tissues from the pocket to the dermatotomy and the port reservoir placed within the subcutaneous pocket. The venous access site was then serially dilated and a peel away vascular sheath placed over the wire. The wire was removed and the port catheter advanced into position under fluoroscopic guidance. The catheter tip is positioned in the superior cavoatrial junction. This was documented with a spot image. The portacatheter was then tested and found to flush and aspirate well. The port was flushed with saline followed by 100 units/mL heparinized saline. The pocket was then closed in two layers using first subdermal inverted interrupted absorbable sutures followed by a running subcuticular suture. The epidermis was then sealed with Dermabond. The dermatotomy at the venous access site was also closed with Dermabond. IMPRESSION: Successful placement of a right IJ approach Power Port with ultrasound and fluoroscopic guidance. The catheter is ready for use. Electronically Signed   By: Jacqulynn Cadet M.D.   On: 11/06/2022 13:29    ASSESSMENT & PLAN:   77 year old male with history of hypertension, dyslipidemia, paroxysmal atrial fibrillation on flecainide not on anticoagulation, recent Helicobacter pylori infection, history of prostate cancer treated with radiation therapy and 1 dose of Lupron in June 2022 with  1) recently diagnosed stage IV mantle cell lymphoma With extensive lymphadenopathy and massive splenomegaly Likely bone marrow involvement-peripheral blood flow cytometry positive for mantle cell lymphoma. PET CT scan 12/29/2021  1. Enlarged hypermetabolic cervical, bilateral axillary, retroperitoneal, pelvic, and inguinal lymph nodes. [place] 4. 2. Subcentimeter  mediastinal lymph nodes with uptake similar to mediastinal blood pool. 3. Focal hypermetabolic uptake seen at the head of the right clavicle, concerning for osseous involvement. 4. Splenomegaly. 5. Increased tonsillar soft tissue, right greater than left, concerning for lymphomatous involvement.  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  3) moderate to severe protein calorie malnutrition-much improved patient is gaining weight and has been eating much better. . Wt Readings from Last 3 Encounters:  11/21/22 195 lb 4.8 oz (88.6 kg)  11/07/22 202 lb 12 oz (92 kg)  10/26/22 198 lb 3.2 oz (89.9 kg)     4) acute renal injury-likely due poor p.o. intake.  Due to spontaneous tumor lysis.  Also could be due to his hematuria and some element of urinary obstruction.  Now resolved  5) massive splenomegaly due to mantle cell lymphoma.  Now resolved on repeat imaging-  6) status post tumor lysis syndrome  PLAN: -Discussed lab results from today, 11/28/2022, with the patient and his wife. CBC shows that hemoglobin has increased from 8.7 to 9.4, still anemic. CMP is stable.  -Recommended to stay well hydrated.  -Patient still needs referral to orthopedic.- prefers to see Dr Hector Shade  -patient shows significant clinical response and no notable toxicity after 1st cycle of R-CHOP -Patient can continue the treatment without any dose modifications.  -Recommended RSV vaccine and stay up to speed with other age appropriate vaccines.  -Answered all of patient's questions.  -chemotherapy orders reviewed and signed.  FOLLOW-UP: Per integrated scheduling   The total time spent in the appointment was 32 minutes* .  All of the patient's questions were answered with apparent satisfaction. The patient knows to call the clinic with any problems, questions or concerns.   Sullivan Lone MD MS  AAHIVMS [facility] Hematology/Oncology Physician [facility]  .*Total Encounter Time as defined by the Centers for Medicare and Medicaid Services includes, in addition to the face-to-face time of a patient visit (documented in the note above) non-face-to-face time: obtaining and reviewing outside history, ordering and reviewing medications, tests or procedures, care coordination (communications with other health care professionals or caregivers) and documentation in the medical record.   I, Cleda Mccreedy, am acting as a Education administrator for Sullivan Lone, MD. .I have reviewed the above documentation for accuracy and completeness, and I agree with the above. Brunetta Genera MD

## 2022-11-28 NOTE — Telephone Encounter (Signed)
Patient's spouse called to say they were not going to be able to make appointments before treatment. Included RN, MD and charge to figure out options for patient.

## 2022-11-30 ENCOUNTER — Inpatient Hospital Stay: Payer: Medicare Other | Attending: Hematology and Oncology

## 2022-11-30 VITALS — BP 108/67 | HR 75 | Temp 98.4°F | Resp 16

## 2022-11-30 DIAGNOSIS — I129 Hypertensive chronic kidney disease with stage 1 through stage 4 chronic kidney disease, or unspecified chronic kidney disease: Secondary | ICD-10-CM | POA: Insufficient documentation

## 2022-11-30 DIAGNOSIS — Z87891 Personal history of nicotine dependence: Secondary | ICD-10-CM | POA: Insufficient documentation

## 2022-11-30 DIAGNOSIS — I480 Paroxysmal atrial fibrillation: Secondary | ICD-10-CM | POA: Diagnosis not present

## 2022-11-30 DIAGNOSIS — N1832 Chronic kidney disease, stage 3b: Secondary | ICD-10-CM | POA: Insufficient documentation

## 2022-11-30 DIAGNOSIS — D696 Thrombocytopenia, unspecified: Secondary | ICD-10-CM | POA: Diagnosis not present

## 2022-11-30 DIAGNOSIS — E883 Tumor lysis syndrome: Secondary | ICD-10-CM | POA: Insufficient documentation

## 2022-11-30 DIAGNOSIS — R059 Cough, unspecified: Secondary | ICD-10-CM | POA: Insufficient documentation

## 2022-11-30 DIAGNOSIS — R0981 Nasal congestion: Secondary | ICD-10-CM | POA: Diagnosis not present

## 2022-11-30 DIAGNOSIS — D649 Anemia, unspecified: Secondary | ICD-10-CM | POA: Diagnosis not present

## 2022-11-30 DIAGNOSIS — E785 Hyperlipidemia, unspecified: Secondary | ICD-10-CM | POA: Insufficient documentation

## 2022-11-30 DIAGNOSIS — Z8546 Personal history of malignant neoplasm of prostate: Secondary | ICD-10-CM | POA: Diagnosis not present

## 2022-11-30 DIAGNOSIS — Z5189 Encounter for other specified aftercare: Secondary | ICD-10-CM | POA: Insufficient documentation

## 2022-11-30 DIAGNOSIS — C8318 Mantle cell lymphoma, lymph nodes of multiple sites: Secondary | ICD-10-CM | POA: Diagnosis present

## 2022-11-30 DIAGNOSIS — R590 Localized enlarged lymph nodes: Secondary | ICD-10-CM | POA: Diagnosis not present

## 2022-11-30 DIAGNOSIS — Z7189 Other specified counseling: Secondary | ICD-10-CM

## 2022-11-30 MED ORDER — PEGFILGRASTIM-CBQV 6 MG/0.6ML ~~LOC~~ SOSY
6.0000 mg | PREFILLED_SYRINGE | Freq: Once | SUBCUTANEOUS | Status: AC
  Administered 2022-11-30: 6 mg via SUBCUTANEOUS
  Filled 2022-11-30: qty 0.6

## 2022-11-30 NOTE — Patient Instructions (Signed)

## 2022-12-04 ENCOUNTER — Encounter: Payer: Self-pay | Admitting: Hematology

## 2022-12-08 ENCOUNTER — Other Ambulatory Visit: Payer: Non-veteran care

## 2022-12-08 ENCOUNTER — Ambulatory Visit: Payer: Non-veteran care

## 2022-12-08 ENCOUNTER — Ambulatory Visit: Payer: Non-veteran care | Admitting: Hematology

## 2022-12-10 ENCOUNTER — Other Ambulatory Visit: Payer: Self-pay

## 2022-12-10 ENCOUNTER — Emergency Department (HOSPITAL_COMMUNITY): Payer: No Typology Code available for payment source

## 2022-12-10 ENCOUNTER — Emergency Department (HOSPITAL_COMMUNITY)
Admission: EM | Admit: 2022-12-10 | Discharge: 2022-12-10 | Disposition: A | Discharge status: Home / Self Care | Payer: No Typology Code available for payment source | Attending: Emergency Medicine | Admitting: Emergency Medicine

## 2022-12-10 DIAGNOSIS — N183 Chronic kidney disease, stage 3 unspecified: Secondary | ICD-10-CM | POA: Diagnosis not present

## 2022-12-10 DIAGNOSIS — R600 Localized edema: Secondary | ICD-10-CM | POA: Insufficient documentation

## 2022-12-10 DIAGNOSIS — D649 Anemia, unspecified: Secondary | ICD-10-CM | POA: Diagnosis not present

## 2022-12-10 DIAGNOSIS — R61 Generalized hyperhidrosis: Secondary | ICD-10-CM | POA: Diagnosis not present

## 2022-12-10 DIAGNOSIS — I951 Orthostatic hypotension: Secondary | ICD-10-CM | POA: Diagnosis not present

## 2022-12-10 DIAGNOSIS — Z20822 Contact with and (suspected) exposure to covid-19: Secondary | ICD-10-CM | POA: Diagnosis not present

## 2022-12-10 DIAGNOSIS — I509 Heart failure, unspecified: Secondary | ICD-10-CM | POA: Insufficient documentation

## 2022-12-10 DIAGNOSIS — Z8546 Personal history of malignant neoplasm of prostate: Secondary | ICD-10-CM | POA: Insufficient documentation

## 2022-12-10 DIAGNOSIS — I959 Hypotension, unspecified: Secondary | ICD-10-CM | POA: Diagnosis present

## 2022-12-10 LAB — CBC WITH DIFFERENTIAL/PLATELET
Abs Immature Granulocytes: 0.09 10*3/uL — ABNORMAL HIGH (ref 0.00–0.07)
Basophils Absolute: 0 10*3/uL (ref 0.0–0.1)
Basophils Relative: 0 %
Eosinophils Absolute: 0 10*3/uL (ref 0.0–0.5)
Eosinophils Relative: 0 %
HCT: 25.5 % — ABNORMAL LOW (ref 39.0–52.0)
Hemoglobin: 8 g/dL — ABNORMAL LOW (ref 13.0–17.0)
Immature Granulocytes: 1 %
Lymphocytes Relative: 9 %
Lymphs Abs: 0.6 10*3/uL — ABNORMAL LOW (ref 0.7–4.0)
MCH: 27.1 pg (ref 26.0–34.0)
MCHC: 31.4 g/dL (ref 30.0–36.0)
MCV: 86.4 fL (ref 80.0–100.0)
Monocytes Absolute: 0.7 10*3/uL (ref 0.1–1.0)
Monocytes Relative: 10 %
Neutro Abs: 5.1 10*3/uL (ref 1.7–7.7)
Neutrophils Relative %: 80 %
Platelets: 111 10*3/uL — ABNORMAL LOW (ref 150–400)
RBC: 2.95 MIL/uL — ABNORMAL LOW (ref 4.22–5.81)
RDW: 16.3 % — ABNORMAL HIGH (ref 11.5–15.5)
WBC: 6.4 10*3/uL (ref 4.0–10.5)
nRBC: 0 % (ref 0.0–0.2)

## 2022-12-10 LAB — COMPREHENSIVE METABOLIC PANEL
ALT: 10 U/L (ref 0–44)
AST: 12 U/L — ABNORMAL LOW (ref 15–41)
Albumin: 3 g/dL — ABNORMAL LOW (ref 3.5–5.0)
Alkaline Phosphatase: 55 U/L (ref 38–126)
Anion gap: 10 (ref 5–15)
BUN: 25 mg/dL — ABNORMAL HIGH (ref 8–23)
CO2: 27 mmol/L (ref 22–32)
Calcium: 8.8 mg/dL — ABNORMAL LOW (ref 8.9–10.3)
Chloride: 102 mmol/L (ref 98–111)
Creatinine, Ser: 1.24 mg/dL (ref 0.61–1.24)
GFR, Estimated: >60 mL/min (ref >60)
Glucose, Bld: 116 mg/dL — ABNORMAL HIGH (ref 70–99)
Potassium: 3.1 mmol/L — ABNORMAL LOW (ref 3.5–5.1)
Sodium: 139 mmol/L (ref 135–145)
Total Bilirubin: 0.4 mg/dL (ref 0.3–1.2)
Total Protein: 6.3 g/dL — ABNORMAL LOW (ref 6.5–8.1)

## 2022-12-10 LAB — MAGNESIUM: Magnesium: 2.1 mg/dL (ref 1.7–2.4)

## 2022-12-10 LAB — TROPONIN I (HIGH SENSITIVITY)
Troponin I (High Sensitivity): 8 ng/L (ref <18)
Troponin I (High Sensitivity): 7 ng/L (ref <18)

## 2022-12-10 LAB — RESP PANEL BY RT-PCR (RSV, FLU A&B, COVID)  RVPGX2
Influenza A by PCR: NEGATIVE
Influenza B by PCR: NEGATIVE
Resp Syncytial Virus by PCR: NEGATIVE
SARS Coronavirus 2 by RT PCR: NEGATIVE

## 2022-12-10 LAB — BRAIN NATRIURETIC PEPTIDE: B Natriuretic Peptide: 84.1 pg/mL (ref 0.0–100.0)

## 2022-12-10 MED ORDER — HEPARIN SOD (PORK) LOCK FLUSH 100 UNIT/ML IV SOLN
INTRAVENOUS | Status: AC
  Administered 2022-12-10: 500 [IU]
  Filled 2022-12-10: qty 5

## 2022-12-10 MED ORDER — SODIUM CHLORIDE 0.9 % IV BOLUS
500.0000 mL | Freq: Once | INTRAVENOUS | Status: AC
  Administered 2022-12-10: 500 mL via INTRAVENOUS

## 2022-12-10 NOTE — ED Triage Notes (Signed)
Pt BIB [facility] from home. Pt c/o diaphoresis that woke him up. Pt took his daily morning pressure and realized it was low. Denies symptoms.  102/66 68 HR 98 SPO2 16 RR

## 2022-12-10 NOTE — Discharge Instructions (Signed)
Workup today was reassuring.  We think your blood pressure is low causing the symptoms, I suspect this is likely side effect from the chemotherapy.  Please follow-up with your oncologist this week for reevaluation.  Return to the ED if you have another episode, loss conscious, chest pain, new or concerning symptoms or fevers.

## 2022-12-10 NOTE — ED Provider Notes (Signed)
[facility] EMERGENCY DEPARTMENT AT [facility] Provider Note   CSN: [id number] Arrival date & time: 12/10/22  [id number]     History  Chief Complaint  Patient presents with   Hypotension    Jerry Tran is a 77 y.o. male.  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  Patient states this morning he went to use the bathroom, he felt sweaty and diaphoretic, felt weak but did not experience syncope or presyncope.  He denies any chest pain, shortness of breath, lateralized weakness, headache.  This happened 1 other time a few days ago after chemo therapy infusion 3 days ago.  He denies any recent changes in his medications, he has been having lower extremity swelling.  Per EMS patient was found with a systolic blood pressure in the 90s.  Home Medications Prior to Admission medications   Medication Sig Start Date End Date Taking? Authorizing Provider  acyclovir (ZOVIRAX) 400 MG tablet Take 1 tablet (400 mg total) by mouth daily. 10/17/22   Brunetta Genera, MD  allopurinol (ZYLOPRIM) 100 MG tablet Take 1 tablet (100 mg total) by mouth 2 (two) times daily. 10/17/22   Brunetta Genera, MD  ammonium lactate (LAC-HYDRIN) 12 % lotion Apply 1 application topically 2 (two) times daily as needed for dry skin.    [provider]  atorvastatin (LIPITOR) 20 MG tablet Take 10 mg by mouth at bedtime.    [provider]  Cholecalciferol (VITAMIN D3 PO) Take 1 tablet by mouth every morning.    [provider]  diclofenac Sodium (VOLTAREN) 1 % GEL Apply 2 g topically 4 (four) times daily as needed (pain).    [provider]  feeding supplement (ENSURE ENLIVE / ENSURE PLUS) LIQD Take 237 mLs by mouth 2 (two) times daily between meals. 12/11/21   Regalado, Belkys A, MD  flecainide (TAMBOCOR) 100 MG tablet  Take 100 mg by mouth every 12 (twelve) hours.    [provider]  fluticasone (FLONASE) 50 MCG/ACT nasal spray Place 1 spray into both nostrils daily as needed for allergies or rhinitis.    [provider]  hydroxypropyl methylcellulose / hypromellose (ISOPTO TEARS / GONIOVISC) 2.5 % ophthalmic solution Place 2 drops into both eyes 2 (two) times daily as needed for dry eyes.    [provider]  lidocaine-prilocaine (EMLA) cream Apply to affected area once 10/17/22   Brunetta Genera, MD  LORazepam (ATIVAN) 0.5 MG tablet Take 1 tablet (0.5 mg total) by mouth every 6 (six) hours as needed (Nausea or vomiting). 12/16/21   Brunetta Genera, MD  metoprolol tartrate (LOPRESSOR) 25 MG tablet Take 12.5 mg by mouth 2 (two) times daily. Take with flecainide    [provider]  mirtazapine (REMERON) 30 MG tablet Take 30 mg by mouth at bedtime. 01/31/22   [provider]  omeprazole (PRILOSEC) 20 MG capsule Take 20 mg by mouth daily before breakfast.    [provider]  ondansetron (ZOFRAN) 8 MG tablet Take 1 tablet (8 mg total) by mouth every 8 (eight) hours as needed for nausea or vomiting. Start on the third day after cyclophosphamide chemotherapy. 10/17/22   Brunetta Genera, MD  potassium chloride SA (KLOR-CON M) 20 MEQ tablet Take 1 tablet (20 mEq total) by mouth 2 (two) times daily. 04/25/22   Irene Limbo,  Cloria Spring, MD  prazosin (MINIPRESS) 2 MG capsule Take 6 mg by mouth at bedtime.    [provider]  predniSONE (DELTASONE) 20 MG tablet Take 3 tablets (60 mg total) by mouth daily. Take with food on days 1-5 of chemotherapy. 10/17/22   Brunetta Genera, MD  prochlorperazine (COMPAZINE) 10 MG tablet Take 1 tablet (10 mg total) by mouth every 6 (six) hours as needed for nausea or vomiting. 10/17/22   Brunetta Genera, MD  terbinafine (LAMISIL) 1 % cream Apply 1 application topically 2 (two) times daily as needed (athlete's foot).     [provider]  torsemide (DEMADEX) 10 MG tablet Take 20 mg by mouth daily.    [provider]  triamcinolone cream (KENALOG) 0.1 % Apply 1 application topically 2 (two) times daily as needed (rash).    [provider]      Allergies    Sildenafil    Review of Systems   Review of Systems  Physical Exam Updated Vital Signs BP 114/75 (BP Location: Right Arm)   Pulse 66   Temp 98 F (36.7 C) (Oral)   Resp 16   SpO2 98%  Physical Exam Vitals and nursing note reviewed. Exam conducted with a chaperone present.  Constitutional:      Appearance: Normal appearance.     Comments: Chronically ill-appearing  HENT:     Head: Normocephalic and atraumatic.  Eyes:     General: No scleral icterus.       Right eye: No discharge.        Left eye: No discharge.     Extraocular Movements: Extraocular movements intact.     Pupils: Pupils are equal, round, and reactive to light.  Cardiovascular:     Rate and Rhythm: Normal rate and regular rhythm.     Pulses: Normal pulses.     Heart sounds: Normal heart sounds.     No friction rub. No gallop.     Comments: Upper and lower extremity pulses are symmetric bilaterally Pulmonary:     Effort: Pulmonary effort is normal. No respiratory distress.     Breath sounds: Normal breath sounds.  Abdominal:     General: Abdomen is flat. Bowel sounds are normal. There is no distension.     Palpations: Abdomen is soft.     Tenderness: There is no abdominal tenderness.     Comments: Soft and nontender  Musculoskeletal:     Right lower leg: Edema present.     Left lower leg: Edema present.     Comments: Bilateral LE pitting edema  Skin:    General: Skin is warm and dry.     Coloration: Skin is not jaundiced.  Neurological:     Mental Status: He is alert. Mental status is at baseline.     Coordination: Coordination normal.     ED Results / Procedures / Treatments   Labs (all labs ordered are listed, but only abnormal  results are displayed) Labs Reviewed  CBC WITH DIFFERENTIAL/PLATELET - Abnormal; Notable for the following components:      Result Value   RBC 2.95 (*)    Hemoglobin 8.0 (*)    HCT 25.5 (*)    RDW 16.3 (*)    Platelets 111 (*)    Lymphs Abs 0.6 (*)    Abs Immature Granulocytes 0.09 (*)    All other components within normal limits  COMPREHENSIVE METABOLIC PANEL - Abnormal; Notable for the following components:   Potassium 3.1 (*)  Glucose, Bld 116 (*)    BUN 25 (*)    Calcium 8.8 (*)    Total Protein 6.3 (*)    Albumin 3.0 (*)    AST 12 (*)    All other components within normal limits  RESP PANEL BY RT-PCR (RSV, FLU A&B, COVID)  RVPGX2  BRAIN NATRIURETIC PEPTIDE  MAGNESIUM  TSH  TROPONIN I (HIGH SENSITIVITY)  TROPONIN I (HIGH SENSITIVITY)    EKG EKG Interpretation  Date/Time:  Sunday December 10 2022 07:14:36 EST Ventricular Rate:  70 PR Interval:  208 QRS Duration: 91 QT Interval:  376 QTC Calculation: 406 R Axis:   80 Text Interpretation: Sinus rhythm Low voltage, extremity leads No acute changes No significant change since last tracing Confirmed by Varney Biles [phone]) on 12/10/2022 8:14:50 AM  Radiology DG Chest Portable 1 View  Result Date: 12/10/2022 CLINICAL DATA:  Irregular heartbeat. EXAM: PORTABLE CHEST 1 VIEW COMPARISON:  10/03/2022 FINDINGS: 0723 hours. Cardiopericardial silhouette is at upper limits of normal for size. The lungs are clear without focal pneumonia, edema, pneumothorax or pleural effusion. Right Port-A-Cath noted with tip overlying the distal SVC level. Telemetry leads overlie the chest. IMPRESSION: No active disease. Electronically Signed   By: Misty Stanley M.D.   On: 12/10/2022 07:31    Procedures Procedures    Medications Ordered in ED Medications  sodium chloride 0.9 % bolus 500 mL (0 mLs Intravenous Stopped 12/10/22 0938)  heparin lock flush 100 UNIT/ML injection (500 Units  Given 12/10/22 1002)    ED Course/ Medical Decision  Making/ A&P Clinical Course as of 12/10/22 1432  Sun Dec 10, 2022  0746 Patient is in sinus rhythm on cardiac monitor per my interpretation [HS]  0746 DG Chest Portable 1 View No acute process or obvious infection.  No consolidation or widened mediastinum [HS]  0818 Last EF 10/25/22 60 to 65%. [HS]  0820 CBC with Differential(!) Patient is not leukopenic or neutropenic.  He is anemic with a hemoglobin of 8, per chart review is 1.3 decreased from baseline 12 days ago but patient is chronically pretty anemic. [HS]  0820 Patient became impressively orthostatic during orthostatic vitals, systolic dropped from [id number] from laying down to standing  [HS]  0826 Resp panel by RT-PCR (RSV, Flu A&B, Covid) Anterior Nasal Swab Negative for COVID and flu [HS]  0827 Comprehensive metabolic panel(!) Slightly hypokalemic with a potassium of 3.1, slightly hypoalbuminemia 3.0.  No significant electrolyte derangement or transaminitis [HS]  0827 Magnesium Wnl  [HS]  0827 Troponin I (High Sensitivity) Low, given no cp or EKG changes doubt ACS [HS]    Clinical Course User Index [HS] Sherrill Raring, PA-C                             Medical Decision Making Amount and/or Complexity of Data Reviewed Labs: ordered. Decision-making details documented in ED Course. Radiology: ordered. Decision-making details documented in ED Course.   This is a 77 year old male presenting to the emergency department due to diaphoresis and hypotension.  Differential is broad and includes ACS, PE, pneumonia, electrolyte derangement, vasovagal, dissection. On exam patient is normotensive.  He has symmetric upper and lower extremity pulses, regular rate and rhythm.  Abdomen is nontender.  Will check broad laboratory workup, chest x-ray, EKG and keep on cardiac monitoring.  Patient's wife is at bedside providing additional history.  Also the external medical records including patient's oncology treatment and medication  changes.  I  ordered, viewed and interpreted laboratory workup as documented in ED course.   No sirs, clinically not septic. I suspect patient's symptoms most likely secondary to orthostatic hypotension.  Could also be worsened by recent chemotherapy treatment. Doubt dissection based exam, not consistent with ACS given normal troponin and EKG is without ischemic changes.  Also no underlying arrhythmia detected.  There is no gross electrolyte derangement, patient is anemic with a hemoglobin of 8 but per chart review he is chronically anemic, encouraged to follow-up with his primary regarding this.  I considered admission but patient is well-appearing and asymptomatic with ambulation to I think it is reasonable to do close outpatient follow-up with strict cautions.  Discussed HPI, physical exam and plan of care for this patient with attending Ankit Nanavati. The attending physician evaluated this patient as part of a shared visit and agrees with plan of care.         Final Clinical Impression(s) / ED Diagnoses Final diagnoses:  Orthostatic hypotension  Chronic anemia  Night sweats    Rx / DC Orders ED Discharge Orders     None         Sherrill Raring, PA-C 12/10/22 [place], Ankit, MD 12/13/22 [phone]

## 2022-12-11 ENCOUNTER — Ambulatory Visit: Payer: Non-veteran care

## 2022-12-18 MED FILL — Dexamethasone Sodium Phosphate Inj 100 MG/10ML: INTRAMUSCULAR | Qty: 1 | Status: AC

## 2022-12-18 MED FILL — Fosaprepitant Dimeglumine For IV Infusion 150 MG (Base Eq): INTRAVENOUS | Qty: 5 | Status: AC

## 2022-12-19 ENCOUNTER — Inpatient Hospital Stay: Payer: Medicare Other

## 2022-12-19 ENCOUNTER — Inpatient Hospital Stay (HOSPITAL_BASED_OUTPATIENT_CLINIC_OR_DEPARTMENT_OTHER): Payer: Medicare Other | Admitting: Hematology

## 2022-12-19 VITALS — BP 108/65 | HR 70 | Temp 97.9°F | Resp 18 | Wt 191.9 lb

## 2022-12-19 VITALS — BP 108/66 | HR 62 | Temp 98.2°F | Resp 18

## 2022-12-19 DIAGNOSIS — C8318 Mantle cell lymphoma, lymph nodes of multiple sites: Secondary | ICD-10-CM

## 2022-12-19 DIAGNOSIS — Z7189 Other specified counseling: Secondary | ICD-10-CM

## 2022-12-19 DIAGNOSIS — Z5111 Encounter for antineoplastic chemotherapy: Secondary | ICD-10-CM | POA: Diagnosis not present

## 2022-12-19 LAB — CMP (CANCER CENTER ONLY)
ALT: 7 U/L (ref 0–44)
AST: 11 U/L — ABNORMAL LOW (ref 15–41)
Albumin: 3.5 g/dL (ref 3.5–5.0)
Alkaline Phosphatase: 62 U/L (ref 38–126)
Anion gap: 6 (ref 5–15)
BUN: 22 mg/dL (ref 8–23)
CO2: 28 mmol/L (ref 22–32)
Calcium: 9 mg/dL (ref 8.9–10.3)
Chloride: 106 mmol/L (ref 98–111)
Creatinine: 1.02 mg/dL (ref 0.61–1.24)
GFR, Estimated: >60 mL/min (ref >60)
Glucose, Bld: 111 mg/dL — ABNORMAL HIGH (ref 70–99)
Potassium: 3.4 mmol/L — ABNORMAL LOW (ref 3.5–5.1)
Sodium: 140 mmol/L (ref 135–145)
Total Bilirubin: 0.2 mg/dL — ABNORMAL LOW (ref 0.3–1.2)
Total Protein: 6.5 g/dL (ref 6.5–8.1)

## 2022-12-19 LAB — CBC WITH DIFFERENTIAL (CANCER CENTER ONLY)
Abs Immature Granulocytes: 0.03 10*3/uL (ref 0.00–0.07)
Basophils Absolute: 0 10*3/uL (ref 0.0–0.1)
Basophils Relative: 1 %
Eosinophils Absolute: 0 10*3/uL (ref 0.0–0.5)
Eosinophils Relative: 1 %
HCT: 25.8 % — ABNORMAL LOW (ref 39.0–52.0)
Hemoglobin: 8.3 g/dL — ABNORMAL LOW (ref 13.0–17.0)
Immature Granulocytes: 1 %
Lymphocytes Relative: 19 %
Lymphs Abs: 0.6 10*3/uL — ABNORMAL LOW (ref 0.7–4.0)
MCH: 27.2 pg (ref 26.0–34.0)
MCHC: 32.2 g/dL (ref 30.0–36.0)
MCV: 84.6 fL (ref 80.0–100.0)
Monocytes Absolute: 0.4 10*3/uL (ref 0.1–1.0)
Monocytes Relative: 12 %
Neutro Abs: 2.2 10*3/uL (ref 1.7–7.7)
Neutrophils Relative %: 66 %
Platelet Count: 300 10*3/uL (ref 150–400)
RBC: 3.05 MIL/uL — ABNORMAL LOW (ref 4.22–5.81)
RDW: 15.6 % — ABNORMAL HIGH (ref 11.5–15.5)
WBC Count: 3.3 10*3/uL — ABNORMAL LOW (ref 4.0–10.5)
nRBC: 0 % (ref 0.0–0.2)

## 2022-12-19 MED ORDER — ACETAMINOPHEN 325 MG PO TABS
650.0000 mg | ORAL_TABLET | Freq: Once | ORAL | Status: AC
  Administered 2022-12-19: 650 mg via ORAL
  Filled 2022-12-19: qty 2

## 2022-12-19 MED ORDER — FAMOTIDINE IN NACL 20-0.9 MG/50ML-% IV SOLN
20.0000 mg | Freq: Once | INTRAVENOUS | Status: AC
  Administered 2022-12-19: 20 mg via INTRAVENOUS
  Filled 2022-12-19: qty 50

## 2022-12-19 MED ORDER — PALONOSETRON HCL INJECTION 0.25 MG/5ML
0.2500 mg | Freq: Once | INTRAVENOUS | Status: AC
  Administered 2022-12-19: 0.25 mg via INTRAVENOUS
  Filled 2022-12-19: qty 5

## 2022-12-19 MED ORDER — SODIUM CHLORIDE 0.9% FLUSH
10.0000 mL | INTRAVENOUS | Status: DC | PRN
  Administered 2022-12-19: 10 mL

## 2022-12-19 MED ORDER — SODIUM CHLORIDE 0.9 % IV SOLN
375.0000 mg/m2 | Freq: Once | INTRAVENOUS | Status: AC
  Administered 2022-12-19: 800 mg via INTRAVENOUS
  Filled 2022-12-19: qty 50

## 2022-12-19 MED ORDER — SODIUM CHLORIDE 0.9 % IV SOLN
150.0000 mg | Freq: Once | INTRAVENOUS | Status: AC
  Administered 2022-12-19: 150 mg via INTRAVENOUS
  Filled 2022-12-19: qty 150

## 2022-12-19 MED ORDER — DIPHENHYDRAMINE HCL 25 MG PO CAPS
50.0000 mg | ORAL_CAPSULE | Freq: Once | ORAL | Status: AC
  Administered 2022-12-19: 50 mg via ORAL
  Filled 2022-12-19: qty 2

## 2022-12-19 MED ORDER — HEPARIN SOD (PORK) LOCK FLUSH 100 UNIT/ML IV SOLN
500.0000 [IU] | Freq: Once | INTRAVENOUS | Status: AC | PRN
  Administered 2022-12-19: 500 [IU]

## 2022-12-19 MED ORDER — DOXORUBICIN HCL CHEMO IV INJECTION 2 MG/ML
25.0000 mg/m2 | Freq: Once | INTRAVENOUS | Status: AC
  Administered 2022-12-19: 56 mg via INTRAVENOUS
  Filled 2022-12-19: qty 28

## 2022-12-19 MED ORDER — SODIUM CHLORIDE 0.9 % IV SOLN
10.0000 mg | Freq: Once | INTRAVENOUS | Status: AC
  Administered 2022-12-19: 10 mg via INTRAVENOUS
  Filled 2022-12-19: qty 10

## 2022-12-19 MED ORDER — SODIUM CHLORIDE 0.9 % IV SOLN: Freq: Once | INTRAVENOUS | Status: AC

## 2022-12-19 MED ORDER — VINCRISTINE SULFATE CHEMO INJECTION 1 MG/ML
1.0000 mg | Freq: Once | INTRAVENOUS | Status: AC
  Administered 2022-12-19: 1 mg via INTRAVENOUS
  Filled 2022-12-19: qty 1

## 2022-12-19 MED ORDER — SODIUM CHLORIDE 0.9 % IV SOLN
400.0000 mg/m2 | Freq: Once | INTRAVENOUS | Status: AC
  Administered 2022-12-19: 880 mg via INTRAVENOUS
  Filled 2022-12-19: qty 44

## 2022-12-19 NOTE — Progress Notes (Signed)
Patient seen by MD today  Vitals are within treatment parameters.  Labs reviewed: and are within treatment parameters./ CMP still pending but pt ok to tx  Per physician team, patient is ready for treatment and there are NO modifications to the treatment plan.

## 2022-12-19 NOTE — Patient Instructions (Signed)
[facility]  Discharge Instructions: Thank you for choosing [facility] to provide your oncology and hematology care.   If you have a lab appointment with the [facility], please go directly to the [facility] and check in at the registration area.   Wear comfortable clothing and clothing appropriate for easy access to any Portacath or PICC line.   We strive to give you quality time with your provider. You may need to reschedule your appointment if you arrive late (15 or more minutes).  Arriving late affects you and other patients whose appointments are after yours.  Also, if you miss three or more appointments without notifying the office, you may be dismissed from the clinic at the provider's discretion.      For prescription refill requests, have your pharmacy contact our office and allow 72 hours for refills to be completed.    Today you received the following chemotherapy and/or immunotherapy agents Adriamycin, Vincristine, Cytoxan and Rituxan      To help prevent nausea and vomiting after your treatment, we encourage you to take your nausea medication as directed.  BELOW ARE SYMPTOMS THAT SHOULD BE REPORTED IMMEDIATELY: *FEVER GREATER THAN 100.4 F (38 C) OR HIGHER *CHILLS OR SWEATING *NAUSEA AND VOMITING THAT IS NOT CONTROLLED WITH YOUR NAUSEA MEDICATION *UNUSUAL SHORTNESS OF BREATH *UNUSUAL BRUISING OR BLEEDING *URINARY PROBLEMS (pain or burning when urinating, or frequent urination) *BOWEL PROBLEMS (unusual diarrhea, constipation, pain near the anus) TENDERNESS IN MOUTH AND THROAT WITH OR WITHOUT PRESENCE OF ULCERS (sore throat, sores in mouth, or a toothache) UNUSUAL RASH, SWELLING OR PAIN  UNUSUAL VAGINAL DISCHARGE OR ITCHING   Items with * indicate a potential emergency and should be followed up as soon as possible or go to the Emergency Department if any problems should occur.  Please show the CHEMOTHERAPY ALERT CARD or  IMMUNOTHERAPY ALERT CARD at check-in to the Emergency Department and triage nurse.  Should you have questions after your visit or need to cancel or reschedule your appointment, please contact [facility]  Dept: [phone]  and follow the prompts.  Office hours are 8:00 a.m. to 4:30 p.m. Monday - Friday. Please note that voicemails left after 4:00 p.m. may not be returned until the following business day.  We are closed weekends and major holidays. You have access to a nurse at all times for urgent questions. Please call the main number to the clinic Dept: [phone] and follow the prompts.   For any non-urgent questions, you may also contact your provider using MyChart. We now offer e-Visits for anyone 63 and older to request care online for non-urgent symptoms. For details visit [email or web address].   Also download the MyChart app! Go to the app store, search "MyChart", open the app, select [facility], and log in with your MyChart username and password.

## 2022-12-19 NOTE — Progress Notes (Signed)
HEMATOLOGY/ONCOLOGY CLINIC VISIT NOTE  Date of Service: 12/19/22  Patient Care Team: Patient, No Pcp Per as PCP - General (General Practice)  CHIEF COMPLAINTS/PURPOSE OF CONSULTATION:  Follow-up for continued evaluation and management of relapsed mantle cell lymphoma  HISTORY OF PRESENTING ILLNESS:  Please see previous note for details of initial presentation  INTERVAL HISTORY  Jerry Tran is here for continued evaluation and management of his relapsed mantle cell lymphoma. He is here to start cycle 3 day 1 of R-CHOP.  Patient was last seen by me on 11/28/2022 and he complained of mild bilateral leg swelling due to knee problems and one episode of night sweat.   Patient is accompanied by his wife during this visit. Patient reports he has been doing well overall without any new medical concerns. He denies any throat soreness, problems swallowing, fever, chill, infection issues, new lumps/bumps, abnormal bowel movement, abdominal pain, chest pain, back pain, or leg swelling.   He does complain of mild bilateral leg swelling and thick secretion from mouth. His wife notes he is having some night sweats.   MEDICAL HISTORY:  Past Medical History:  Diagnosis Date   Arthritis    Chronic kidney disease, stage 3b ([place]) 12/09/2021   Dyslipidemia    GERD (gastroesophageal reflux disease)    Hypertension    PAF (paroxysmal atrial fibrillation) (HCC)    Prostate cancer ([place]) 03/2021   s/p radiation therapy   PTSD (post-traumatic stress disorder)     SURGICAL HISTORY: Past Surgical History:  Procedure Laterality Date   IR IMAGING GUIDED PORT INSERTION  11/06/2022   LIPOMA EXCISION Right 12/09/2021   Procedure: RIGHT SUPERCLAVICULAR LYMPH NODE EXCISION;  Surgeon: Ileana Roup, MD;  Location: [facility] OR;  Service: General;  Laterality: Right;    SOCIAL HISTORY: Social History   Socioeconomic History   Marital status: Married    Spouse name: Not on file   Number of children: Not  on file   Years of education: Not on file   Highest education level: Not on file  Occupational History   Occupation: retired  Tobacco Use   Smoking status: Former    Packs/day: 1.00    Years: 15.00    Total pack years: 15.00    Types: Cigarettes   Smokeless tobacco: Never  Substance and Sexual Activity   Alcohol use: Not Currently    Comment: h/o heavy use   Drug use: Not Currently    Types: Marijuana, Cocaine    Comment: remote   Sexual activity: Not on file  Other Topics Concern   Not on file  Social History Narrative   Not on file   Social Determinants of Health   Financial Resource Strain: Not on file  Food Insecurity: Not on file  Transportation Needs: Not on file  Physical Activity: Not on file  Stress: Not on file  Social Connections: Not on file  Intimate Partner Violence: Not on file    FAMILY HISTORY: Family History  Problem Relation Age of Onset   Cancer Neg Hx     ALLERGIES:  is allergic to sildenafil.  MEDICATIONS:  Current Outpatient Medications  Medication Sig Dispense Refill   acyclovir (ZOVIRAX) 400 MG tablet Take 1 tablet (400 mg total) by mouth daily. 30 tablet 3   allopurinol (ZYLOPRIM) 100 MG tablet Take 1 tablet (100 mg total) by mouth 2 (two) times daily. 60 tablet 0   ammonium lactate (LAC-HYDRIN) 12 % lotion Apply 1 application topically 2 (two) times daily  as needed for dry skin.     atorvastatin (LIPITOR) 20 MG tablet Take 10 mg by mouth at bedtime.     Cholecalciferol (VITAMIN D3 PO) Take 1 tablet by mouth every morning.     diclofenac Sodium (VOLTAREN) 1 % GEL Apply 2 g topically 4 (four) times daily as needed (pain).     feeding supplement (ENSURE ENLIVE / ENSURE PLUS) LIQD Take 237 mLs by mouth 2 (two) times daily between meals. 237 mL 12   flecainide (TAMBOCOR) 100 MG tablet Take 100 mg by mouth every 12 (twelve) hours.     fluticasone (FLONASE) 50 MCG/ACT nasal spray Place 1 spray into both nostrils daily as needed for allergies  or rhinitis.     hydroxypropyl methylcellulose / hypromellose (ISOPTO TEARS / GONIOVISC) 2.5 % ophthalmic solution Place 2 drops into both eyes 2 (two) times daily as needed for dry eyes.     lidocaine-prilocaine (EMLA) cream Apply to affected area once 30 g 3   LORazepam (ATIVAN) 0.5 MG tablet Take 1 tablet (0.5 mg total) by mouth every 6 (six) hours as needed (Nausea or vomiting). 30 tablet 0   metoprolol tartrate (LOPRESSOR) 25 MG tablet Take 12.5 mg by mouth 2 (two) times daily. Take with flecainide     mirtazapine (REMERON) 30 MG tablet Take 30 mg by mouth at bedtime.     omeprazole (PRILOSEC) 20 MG capsule Take 20 mg by mouth daily before breakfast.     ondansetron (ZOFRAN) 8 MG tablet Take 1 tablet (8 mg total) by mouth every 8 (eight) hours as needed for nausea or vomiting. Start on the third day after cyclophosphamide chemotherapy. 30 tablet 1   potassium chloride SA (KLOR-CON M) 20 MEQ tablet Take 1 tablet (20 mEq total) by mouth 2 (two) times daily. 60 tablet 0   prazosin (MINIPRESS) 2 MG capsule Take 6 mg by mouth at bedtime.     predniSONE (DELTASONE) 20 MG tablet Take 3 tablets (60 mg total) by mouth daily. Take with food on days 1-5 of chemotherapy. 25 tablet 5   prochlorperazine (COMPAZINE) 10 MG tablet Take 1 tablet (10 mg total) by mouth every 6 (six) hours as needed for nausea or vomiting. 30 tablet 6   terbinafine (LAMISIL) 1 % cream Apply 1 application topically 2 (two) times daily as needed (athlete's foot).     torsemide (DEMADEX) 10 MG tablet Take 20 mg by mouth daily.     triamcinolone cream (KENALOG) 0.1 % Apply 1 application topically 2 (two) times daily as needed (rash).     No current facility-administered medications for this visit.    REVIEW OF SYSTEMS:   .10 Point review of Systems was done is negative except as noted above.  PHYSICAL EXAMINATION: .BP 108/65   Pulse 70   Temp 97.9 F (36.6 C)   Resp 18   Wt 191 lb 14.4 oz (87 kg)   SpO2 100%   BMI 23.36  kg/m   NAD GENERAL:alert, in no acute distress and comfortable SKIN: no acute rashes, no significant lesions EYES: conjunctiva are pink and non-injected, sclera anicteric OROPHARYNX: MMM, no exudates, no oropharyngeal erythema or ulceration NECK: supple, no JVD LYMPH: Significant new palpable lymphadenopathy in the cervical axillary areas as well as the inguinal areas LUNGS: clear to auscultation b/l with normal respiratory effort HEART: regular rate & rhythm ABDOMEN:  normoactive bowel sounds , non tender, not distended. Extremity: no pedal edema PSYCH: alert & oriented x 3 with fluent speech NEURO:  no focal motor/sensory deficits   LABORATORY DATA:  I have reviewed the data as listed  .    Latest Ref Rng & Units 12/19/2022    9:43 AM 12/10/2022    7:01 AM 11/28/2022   10:08 AM  CBC  WBC 4.0 - 10.5 K/uL 3.3  6.4  6.2   Hemoglobin 13.0 - 17.0 g/dL 8.3  8.0  9.4   Hematocrit 39.0 - 52.0 % 25.8  25.5  28.4   Platelets 150 - 400 K/uL 300  111  241   ANC 700     Latest Ref Rng & Units 12/19/2022    9:43 AM 12/10/2022    7:01 AM 11/28/2022   10:08 AM  CMP  Glucose 70 - 99 mg/dL 111  116  154   BUN 8 - 23 mg/dL '22  25  28   '$ Creatinine 0.61 - 1.24 mg/dL 1.02  1.24  1.01   Sodium 135 - 145 mmol/L 140  139  140   Potassium 3.5 - 5.1 mmol/L 3.4  3.1  3.5   Chloride 98 - 111 mmol/L 106  102  106   CO2 22 - 32 mmol/L '28  27  26   '$ Calcium 8.9 - 10.3 mg/dL 9.0  8.8  9.8   Total Protein 6.5 - 8.1 g/dL 6.5  6.3  7.1   Total Bilirubin 0.3 - 1.2 mg/dL 0.2  0.4  0.3   Alkaline Phos 38 - 126 U/L 62  55  66   AST 15 - 41 U/L '11  12  13   '$ ALT 0 - 44 U/L '7  10  9   '$ . Lab Results  Component Value Date   LDH 151 05/25/2022    Surgical Pathology  CASE: [id number]  PATIENT: Soma Saulters  Flow Pathology Report  Clinical history: Mantle Cell Lymphoma of lymph nodes of multiple  regions  DIAGNOSIS:  -Monoclonal B-cell population identified  -See comment   COMMENT:   The findings  are consistent with involvement by previously known mantle  cell lymphoma.    RADIOGRAPHIC STUDIES: I have personally reviewed the radiological images as listed and agreed with the findings in the report.  DG Chest Portable 1 View  Result Date: 12/10/2022 CLINICAL DATA:  Irregular heartbeat. EXAM: PORTABLE CHEST 1 VIEW COMPARISON:  10/03/2022 FINDINGS: 0723 hours. Cardiopericardial silhouette is at upper limits of normal for size. The lungs are clear without focal pneumonia, edema, pneumothorax or pleural effusion. Right Port-A-Cath noted with tip overlying the distal SVC level. Telemetry leads overlie the chest. IMPRESSION: No active disease. Electronically Signed   By: Misty Stanley M.D.   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  3) moderate to severe protein calorie malnutrition-much improved patient is gaining weight and has been eating much better. . Wt Readings from Last 3 Encounters:  12/19/22 191 lb 14.4 oz (87 kg)  11/28/22 192 lb 8 oz (87.3 kg)  11/21/22 195 lb 4.8 oz (88.6 kg)    PLAN: -Discussed  lab results from today, 12/19/2022, with the patient and his wife. CBC showed slightly decreased WBC of 3.3, slightly decreased hemoglobin of 8.3, and hematocrit of 25.8. Patient is anemic.  CMP is stable. -recommended baking soda mouthwash.  -Discussed the option of blood transfusion if patient is extremely anemic.   -Recommended to stay well  hydrated.  -patient shows significant clinical response and no notable toxicity after 2nd cycle of R-CHOP. -Patient can continue the treatment without any dose modifications.  -Schedule PET scan before cycle 4.  -Answered all of patient's questions.    FOLLOW-UP: Port flush , labs and 1 unit of PRBC in 10 days Plz schedule next 2 cycles of R-CHOP per integrated scheduling PET/CT in 2 weeks  The total time spent in the appointment was 32 minutes* .  All of the patient's questions were answered with apparent satisfaction. The patient knows to call the clinic with any problems, questions or concerns.   Sullivan Lone MD MS AAHIVMS [facility] Hematology/Oncology Physician [facility]  .*Total Encounter Time as defined by the Centers for Medicare and Medicaid Services includes, in addition to the face-to-face time of a patient visit (documented in the note above) non-face-to-face time: obtaining and reviewing outside history, ordering and reviewing medications, tests or procedures, care coordination (communications with other health care professionals or caregivers) and documentation in the medical record.   I, Jerry Tran, am acting as a Education administrator for Sullivan Lone, MD. .I have reviewed the above documentation for accuracy and completeness, and I agree with the above. Brunetta Genera MD

## 2022-12-21 ENCOUNTER — Other Ambulatory Visit: Payer: Self-pay

## 2022-12-21 ENCOUNTER — Inpatient Hospital Stay: Payer: Medicare Other

## 2022-12-21 VITALS — BP 101/68 | HR 76 | Temp 98.3°F | Resp 18

## 2022-12-21 DIAGNOSIS — C8318 Mantle cell lymphoma, lymph nodes of multiple sites: Secondary | ICD-10-CM

## 2022-12-21 DIAGNOSIS — Z7189 Other specified counseling: Secondary | ICD-10-CM

## 2022-12-21 MED ORDER — PEGFILGRASTIM-CBQV 6 MG/0.6ML ~~LOC~~ SOSY
6.0000 mg | PREFILLED_SYRINGE | Freq: Once | SUBCUTANEOUS | Status: AC
  Administered 2022-12-21: 6 mg via SUBCUTANEOUS
  Filled 2022-12-21: qty 0.6

## 2022-12-25 ENCOUNTER — Encounter: Payer: Self-pay | Admitting: Hematology

## 2022-12-26 ENCOUNTER — Other Ambulatory Visit: Payer: Self-pay

## 2022-12-26 DIAGNOSIS — C8318 Mantle cell lymphoma, lymph nodes of multiple sites: Secondary | ICD-10-CM

## 2022-12-29 ENCOUNTER — Inpatient Hospital Stay: Payer: Medicare Other

## 2022-12-29 ENCOUNTER — Inpatient Hospital Stay: Payer: Medicare Other | Attending: Hematology and Oncology

## 2022-12-29 ENCOUNTER — Other Ambulatory Visit: Payer: Non-veteran care

## 2022-12-29 ENCOUNTER — Ambulatory Visit: Payer: Non-veteran care | Admitting: Hematology

## 2022-12-29 ENCOUNTER — Ambulatory Visit: Payer: Non-veteran care

## 2022-12-29 DIAGNOSIS — I480 Paroxysmal atrial fibrillation: Secondary | ICD-10-CM | POA: Diagnosis not present

## 2022-12-29 DIAGNOSIS — C8318 Mantle cell lymphoma, lymph nodes of multiple sites: Secondary | ICD-10-CM

## 2022-12-29 DIAGNOSIS — E883 Tumor lysis syndrome: Secondary | ICD-10-CM

## 2022-12-29 DIAGNOSIS — Z87891 Personal history of nicotine dependence: Secondary | ICD-10-CM | POA: Insufficient documentation

## 2022-12-29 DIAGNOSIS — I129 Hypertensive chronic kidney disease with stage 1 through stage 4 chronic kidney disease, or unspecified chronic kidney disease: Secondary | ICD-10-CM | POA: Diagnosis not present

## 2022-12-29 DIAGNOSIS — D649 Anemia, unspecified: Secondary | ICD-10-CM | POA: Insufficient documentation

## 2022-12-29 DIAGNOSIS — R059 Cough, unspecified: Secondary | ICD-10-CM | POA: Diagnosis not present

## 2022-12-29 DIAGNOSIS — E785 Hyperlipidemia, unspecified: Secondary | ICD-10-CM | POA: Insufficient documentation

## 2022-12-29 DIAGNOSIS — N1832 Chronic kidney disease, stage 3b: Secondary | ICD-10-CM | POA: Insufficient documentation

## 2022-12-29 DIAGNOSIS — Z8546 Personal history of malignant neoplasm of prostate: Secondary | ICD-10-CM | POA: Diagnosis not present

## 2022-12-29 DIAGNOSIS — R0981 Nasal congestion: Secondary | ICD-10-CM | POA: Diagnosis not present

## 2022-12-29 DIAGNOSIS — D696 Thrombocytopenia, unspecified: Secondary | ICD-10-CM | POA: Diagnosis not present

## 2022-12-29 DIAGNOSIS — Z5112 Encounter for antineoplastic immunotherapy: Secondary | ICD-10-CM | POA: Insufficient documentation

## 2022-12-29 DIAGNOSIS — Z5189 Encounter for other specified aftercare: Secondary | ICD-10-CM | POA: Diagnosis not present

## 2022-12-29 DIAGNOSIS — R590 Localized enlarged lymph nodes: Secondary | ICD-10-CM | POA: Insufficient documentation

## 2022-12-29 LAB — CMP (CANCER CENTER ONLY)
ALT: 10 U/L (ref 0–44)
AST: 13 U/L — ABNORMAL LOW (ref 15–41)
Albumin: 3.3 g/dL — ABNORMAL LOW (ref 3.5–5.0)
Alkaline Phosphatase: 70 U/L (ref 38–126)
Anion gap: 7 (ref 5–15)
BUN: 17 mg/dL (ref 8–23)
CO2: 29 mmol/L (ref 22–32)
Calcium: 8.9 mg/dL (ref 8.9–10.3)
Chloride: 104 mmol/L (ref 98–111)
Creatinine: 1.08 mg/dL (ref 0.61–1.24)
GFR, Estimated: >60 mL/min (ref >60)
Glucose, Bld: 107 mg/dL — ABNORMAL HIGH (ref 70–99)
Potassium: 2.9 mmol/L — ABNORMAL LOW (ref 3.5–5.1)
Sodium: 140 mmol/L (ref 135–145)
Total Bilirubin: 0.3 mg/dL (ref 0.3–1.2)
Total Protein: 6.8 g/dL (ref 6.5–8.1)

## 2022-12-29 LAB — CBC WITH DIFFERENTIAL (CANCER CENTER ONLY)
Abs Immature Granulocytes: 0.07 10*3/uL (ref 0.00–0.07)
Basophils Absolute: 0 10*3/uL (ref 0.0–0.1)
Basophils Relative: 0 %
Eosinophils Absolute: 0 10*3/uL (ref 0.0–0.5)
Eosinophils Relative: 1 %
HCT: 24.4 % — ABNORMAL LOW (ref 39.0–52.0)
Hemoglobin: 8 g/dL — ABNORMAL LOW (ref 13.0–17.0)
Immature Granulocytes: 1 %
Lymphocytes Relative: 8 %
Lymphs Abs: 0.5 10*3/uL — ABNORMAL LOW (ref 0.7–4.0)
MCH: 27.5 pg (ref 26.0–34.0)
MCHC: 32.8 g/dL (ref 30.0–36.0)
MCV: 83.8 fL (ref 80.0–100.0)
Monocytes Absolute: 0.7 10*3/uL (ref 0.1–1.0)
Monocytes Relative: 11 %
Neutro Abs: 5.2 10*3/uL (ref 1.7–7.7)
Neutrophils Relative %: 79 %
Platelet Count: 169 10*3/uL (ref 150–400)
RBC: 2.91 MIL/uL — ABNORMAL LOW (ref 4.22–5.81)
RDW: 15.9 % — ABNORMAL HIGH (ref 11.5–15.5)
WBC Count: 6.5 10*3/uL (ref 4.0–10.5)
nRBC: 0 % (ref 0.0–0.2)

## 2022-12-29 LAB — SAMPLE TO BLOOD BANK

## 2022-12-29 LAB — TYPE AND SCREEN
ABO/RH(D): B POS
Antibody Screen: NEGATIVE

## 2022-12-29 MED ORDER — HEPARIN SOD (PORK) LOCK FLUSH 100 UNIT/ML IV SOLN
500.0000 [IU] | Freq: Once | INTRAVENOUS | Status: AC | PRN
  Administered 2022-12-29: 500 [IU]

## 2022-12-29 MED ORDER — SODIUM CHLORIDE 0.9% FLUSH
10.0000 mL | Freq: Once | INTRAVENOUS | Status: AC | PRN
  Administered 2022-12-29: 10 mL

## 2022-12-29 NOTE — Progress Notes (Signed)
Patient was here today for a blood transfusion. Hemoglobin is 8.0 and hematocrit was 24.4. Dr. Irene Limbo contacted with results. Dr. Irene Limbo stated that if patient is not symptomatic then 8.0 is sufficient and that he does not need a blood transfusion. Patient was specifically questioned about status- no significant SOB, dizziness or light-headedness. Walking around with no signs of labored breathing. Patient and wife informed of Dr. Grier Mitts decision- patient PAC de-accessed.

## 2023-01-01 ENCOUNTER — Ambulatory Visit: Payer: Non-veteran care

## 2023-01-08 MED FILL — Fosaprepitant Dimeglumine For IV Infusion 150 MG (Base Eq): INTRAVENOUS | Qty: 5 | Status: AC

## 2023-01-08 MED FILL — Dexamethasone Sodium Phosphate Inj 100 MG/10ML: INTRAMUSCULAR | Qty: 1 | Status: AC

## 2023-01-09 ENCOUNTER — Inpatient Hospital Stay: Payer: Medicare Other

## 2023-01-09 ENCOUNTER — Inpatient Hospital Stay (HOSPITAL_BASED_OUTPATIENT_CLINIC_OR_DEPARTMENT_OTHER): Payer: Medicare Other | Admitting: Hematology

## 2023-01-09 VITALS — BP 106/69 | HR 69 | Temp 98.1°F | Resp 20 | Wt 192.4 lb

## 2023-01-09 VITALS — BP 140/92 | HR 72 | Temp 97.9°F | Resp 18

## 2023-01-09 DIAGNOSIS — C8318 Mantle cell lymphoma, lymph nodes of multiple sites: Secondary | ICD-10-CM | POA: Diagnosis not present

## 2023-01-09 DIAGNOSIS — Z7189 Other specified counseling: Secondary | ICD-10-CM

## 2023-01-09 DIAGNOSIS — Z5112 Encounter for antineoplastic immunotherapy: Secondary | ICD-10-CM | POA: Diagnosis not present

## 2023-01-09 DIAGNOSIS — E883 Tumor lysis syndrome: Secondary | ICD-10-CM

## 2023-01-09 LAB — CMP (CANCER CENTER ONLY)
ALT: 7 U/L (ref 0–44)
AST: 11 U/L — ABNORMAL LOW (ref 15–41)
Albumin: 3.6 g/dL (ref 3.5–5.0)
Alkaline Phosphatase: 69 U/L (ref 38–126)
Anion gap: 6 (ref 5–15)
BUN: 19 mg/dL (ref 8–23)
CO2: 29 mmol/L (ref 22–32)
Calcium: 9.1 mg/dL (ref 8.9–10.3)
Chloride: 108 mmol/L (ref 98–111)
Creatinine: 0.98 mg/dL (ref 0.61–1.24)
GFR, Estimated: >60 mL/min (ref >60)
Glucose, Bld: 101 mg/dL — ABNORMAL HIGH (ref 70–99)
Potassium: 3.5 mmol/L (ref 3.5–5.1)
Sodium: 143 mmol/L (ref 135–145)
Total Bilirubin: 0.2 mg/dL — ABNORMAL LOW (ref 0.3–1.2)
Total Protein: 6.5 g/dL (ref 6.5–8.1)

## 2023-01-09 LAB — CBC WITH DIFFERENTIAL (CANCER CENTER ONLY)
Abs Immature Granulocytes: 0.02 10*3/uL (ref 0.00–0.07)
Basophils Absolute: 0 10*3/uL (ref 0.0–0.1)
Basophils Relative: 1 %
Eosinophils Absolute: 0.1 10*3/uL (ref 0.0–0.5)
Eosinophils Relative: 2 %
HCT: 26.1 % — ABNORMAL LOW (ref 39.0–52.0)
Hemoglobin: 8.5 g/dL — ABNORMAL LOW (ref 13.0–17.0)
Immature Granulocytes: 1 %
Lymphocytes Relative: 14 %
Lymphs Abs: 0.5 10*3/uL — ABNORMAL LOW (ref 0.7–4.0)
MCH: 27.8 pg (ref 26.0–34.0)
MCHC: 32.6 g/dL (ref 30.0–36.0)
MCV: 85.3 fL (ref 80.0–100.0)
Monocytes Absolute: 0.3 10*3/uL (ref 0.1–1.0)
Monocytes Relative: 8 %
Neutro Abs: 3 10*3/uL (ref 1.7–7.7)
Neutrophils Relative %: 74 %
Platelet Count: 207 10*3/uL (ref 150–400)
RBC: 3.06 MIL/uL — ABNORMAL LOW (ref 4.22–5.81)
RDW: 16.9 % — ABNORMAL HIGH (ref 11.5–15.5)
WBC Count: 4 10*3/uL (ref 4.0–10.5)
nRBC: 0 % (ref 0.0–0.2)

## 2023-01-09 MED ORDER — SODIUM CHLORIDE 0.9 % IV SOLN
400.0000 mg/m2 | Freq: Once | INTRAVENOUS | Status: AC
  Administered 2023-01-09: 880 mg via INTRAVENOUS
  Filled 2023-01-09: qty 44

## 2023-01-09 MED ORDER — SODIUM CHLORIDE 0.9 % IV SOLN
10.0000 mg | Freq: Once | INTRAVENOUS | Status: AC
  Administered 2023-01-09: 10 mg via INTRAVENOUS
  Filled 2023-01-09: qty 10

## 2023-01-09 MED ORDER — SODIUM CHLORIDE 0.9% FLUSH
10.0000 mL | Freq: Once | INTRAVENOUS | Status: AC
  Administered 2023-01-09: 10 mL

## 2023-01-09 MED ORDER — DIPHENHYDRAMINE HCL 25 MG PO CAPS
50.0000 mg | ORAL_CAPSULE | Freq: Once | ORAL | Status: AC
  Administered 2023-01-09: 50 mg via ORAL
  Filled 2023-01-09: qty 2

## 2023-01-09 MED ORDER — SODIUM CHLORIDE 0.9 % IV SOLN
150.0000 mg | Freq: Once | INTRAVENOUS | Status: AC
  Administered 2023-01-09: 150 mg via INTRAVENOUS
  Filled 2023-01-09: qty 150

## 2023-01-09 MED ORDER — VINCRISTINE SULFATE CHEMO INJECTION 1 MG/ML
1.0000 mg | Freq: Once | INTRAVENOUS | Status: AC
  Administered 2023-01-09: 1 mg via INTRAVENOUS
  Filled 2023-01-09: qty 1

## 2023-01-09 MED ORDER — ACETAMINOPHEN 325 MG PO TABS
650.0000 mg | ORAL_TABLET | Freq: Once | ORAL | Status: AC
  Administered 2023-01-09: 650 mg via ORAL
  Filled 2023-01-09: qty 2

## 2023-01-09 MED ORDER — SODIUM CHLORIDE 0.9 % IV SOLN
375.0000 mg/m2 | Freq: Once | INTRAVENOUS | Status: AC
  Administered 2023-01-09: 800 mg via INTRAVENOUS
  Filled 2023-01-09: qty 50

## 2023-01-09 MED ORDER — PALONOSETRON HCL INJECTION 0.25 MG/5ML
0.2500 mg | Freq: Once | INTRAVENOUS | Status: AC
  Administered 2023-01-09: 0.25 mg via INTRAVENOUS
  Filled 2023-01-09: qty 5

## 2023-01-09 MED ORDER — SODIUM CHLORIDE 0.9% FLUSH
10.0000 mL | INTRAVENOUS | Status: DC | PRN
  Administered 2023-01-09: 10 mL

## 2023-01-09 MED ORDER — HEPARIN SOD (PORK) LOCK FLUSH 100 UNIT/ML IV SOLN
500.0000 [IU] | Freq: Once | INTRAVENOUS | Status: AC | PRN
  Administered 2023-01-09: 500 [IU]

## 2023-01-09 MED ORDER — SODIUM CHLORIDE 0.9 % IV SOLN: Freq: Once | INTRAVENOUS | Status: AC

## 2023-01-09 MED ORDER — DOXORUBICIN HCL CHEMO IV INJECTION 2 MG/ML
25.0000 mg/m2 | Freq: Once | INTRAVENOUS | Status: AC
  Administered 2023-01-09: 56 mg via INTRAVENOUS
  Filled 2023-01-09: qty 28

## 2023-01-09 MED ORDER — FAMOTIDINE IN NACL 20-0.9 MG/50ML-% IV SOLN
20.0000 mg | Freq: Once | INTRAVENOUS | Status: AC
  Administered 2023-01-09: 20 mg via INTRAVENOUS
  Filled 2023-01-09: qty 50

## 2023-01-09 NOTE — Patient Instructions (Addendum)
[facility]  Discharge Instructions: Thank you for choosing [facility] to provide your oncology and hematology care.   If you have a lab appointment with the Cancer Center, please go directly to the Cancer Center and check in at the registration area.   Wear comfortable clothing and clothing appropriate for easy access to any Portacath or PICC line.   We strive to give you quality time with your provider. You may need to reschedule your appointment if you arrive late (15 or more minutes).  Arriving late affects you and other patients whose appointments are after yours.  Also, if you miss three or more appointments without notifying the office, you may be dismissed from the clinic at the provider's discretion.      For prescription refill requests, have your pharmacy contact our office and allow 72 hours for refills to be completed.    Today you received the following chemotherapy and/or immunotherapy agents Adriamycin, Vincristine, Cytoxan and Rituxan      To help prevent nausea and vomiting after your treatment, we encourage you to take your nausea medication as directed.  BELOW ARE SYMPTOMS THAT SHOULD BE REPORTED IMMEDIATELY: *FEVER GREATER THAN 100.4 F (38 C) OR HIGHER *CHILLS OR SWEATING *NAUSEA AND VOMITING THAT IS NOT CONTROLLED WITH YOUR NAUSEA MEDICATION *UNUSUAL SHORTNESS OF BREATH *UNUSUAL BRUISING OR BLEEDING *URINARY PROBLEMS (pain or burning when urinating, or frequent urination) *BOWEL PROBLEMS (unusual diarrhea, constipation, pain near the anus) TENDERNESS IN MOUTH AND THROAT WITH OR WITHOUT PRESENCE OF ULCERS (sore throat, sores in mouth, or a toothache) UNUSUAL RASH, SWELLING OR PAIN  UNUSUAL VAGINAL DISCHARGE OR ITCHING   Items with * indicate a potential emergency and should be followed up as soon as possible or go to the Emergency Department if any problems should occur.  Please show the CHEMOTHERAPY ALERT CARD or  IMMUNOTHERAPY ALERT CARD at check-in to the Emergency Department and triage nurse.  Should you have questions after your visit or need to cancel or reschedule your appointment, please contact [facility]  Dept: [phone]  and follow the prompts.  Office hours are 8:00 a.m. to 4:30 p.m. Monday - Friday. Please note that voicemails left after 4:00 p.m. may not be returned until the following business day.  We are closed weekends and major holidays. You have access to a nurse at all times for urgent questions. Please call the main number to the clinic Dept: [phone] and follow the prompts.   For any non-urgent questions, you may also contact your provider using MyChart. We now offer e-Visits for anyone 18 and older to request care online for non-urgent symptoms. For details visit [email or web address].   Also download the MyChart app! Go to the app store, search "MyChart", open the app, select [facility], and log in with your MyChart username and password.   

## 2023-01-09 NOTE — Progress Notes (Signed)
Patient seen by MD today  Vitals are within treatment parameters.  Labs reviewed: and are within treatment parameters."CMP has not resulted   Per physician team, patient is ready for treatment and there are NO modifications to the treatment plan.  

## 2023-01-09 NOTE — Progress Notes (Signed)
HEMATOLOGY/ONCOLOGY CLINIC VISIT NOTE  Date of Service: 01/09/23  Patient Care Team: Patient, No Pcp Per as PCP - General (General Practice)  CHIEF COMPLAINTS/PURPOSE OF CONSULTATION:  Follow-up for continued evaluation and management of relapsed mantle cell lymphoma  HISTORY OF PRESENTING ILLNESS:  Please see previous note for details of initial presentation  INTERVAL HISTORY  [name] is here for continued evaluation and management of his relapsed mantle cell lymphoma. He is here to start cycle 4 day 1 of R-CHOP.  Patient was last seen by me on 12/19/2022 and he complained of mild bilateral leg swelling, thick secretion from mouth, and mild night sweats.   Patient is accompanied by his wife during this visit. He reports that he has been doing well overall without any new medical concerns since our last visit. He complains of mild bilateral leg swelling and mild diarrhea. Patient notes that diarrhea due to lactose intolerance.   He denies fever, chills, night sweats, shortness of breath, swallowing problem, wheezing, abnormal bowel movement, abdominal pain, back pain, chest pain.   Patient does report that his voice has changed in the pat few months.  Patient reports his appetite has improved.   MEDICAL HISTORY:  Past Medical History:  Diagnosis Date   Arthritis    Chronic kidney disease, stage 3b ([place]) 12/09/2021   Dyslipidemia    GERD (gastroesophageal reflux disease)    Hypertension    PAF (paroxysmal atrial fibrillation) (HCC)    Prostate cancer ([place]) 03/2021   s/p radiation therapy   PTSD (post-traumatic stress disorder)     SURGICAL HISTORY: Past Surgical History:  Procedure Laterality Date   IR IMAGING GUIDED PORT INSERTION  11/06/2022   LIPOMA EXCISION Right 12/09/2021   Procedure: RIGHT SUPERCLAVICULAR LYMPH NODE EXCISION;  Surgeon: Ileana Roup, MD;  Location: [facility] OR;  Service: General;  Laterality: Right;    SOCIAL HISTORY: Social History    Socioeconomic History   Marital status: Married    Spouse name: Not on file   Number of children: Not on file   Years of education: Not on file   Highest education level: Not on file  Occupational History   Occupation: retired  Tobacco Use   Smoking status: Former    Packs/day: 1.00    Years: 15.00    Total pack years: 15.00    Types: Cigarettes   Smokeless tobacco: Never  Substance and Sexual Activity   Alcohol use: Not Currently    Comment: h/o heavy use   Drug use: Not Currently    Types: Marijuana, Cocaine    Comment: remote   Sexual activity: Not on file  Other Topics Concern   Not on file  Social History Narrative   Not on file   Social Determinants of Health   Financial Resource Strain: Not on file  Food Insecurity: Not on file  Transportation Needs: Not on file  Physical Activity: Not on file  Stress: Not on file  Social Connections: Not on file  Intimate Partner Violence: Not on file    FAMILY HISTORY: Family History  Problem Relation Age of Onset   Cancer Neg Hx     ALLERGIES:  is allergic to sildenafil.  MEDICATIONS:  Current Outpatient Medications  Medication Sig Dispense Refill   acyclovir (ZOVIRAX) 400 MG tablet Take 1 tablet (400 mg total) by mouth daily. 30 tablet 3   allopurinol (ZYLOPRIM) 100 MG tablet Take 1 tablet (100 mg total) by mouth 2 (two) times daily. 60 tablet  0   ammonium lactate (LAC-HYDRIN) 12 % lotion Apply 1 application topically 2 (two) times daily as needed for dry skin.     atorvastatin (LIPITOR) 20 MG tablet Take 10 mg by mouth at bedtime.     Cholecalciferol (VITAMIN D3 PO) Take 1 tablet by mouth every morning.     diclofenac Sodium (VOLTAREN) 1 % GEL Apply 2 g topically 4 (four) times daily as needed (pain).     feeding supplement (ENSURE ENLIVE / ENSURE PLUS) LIQD Take 237 mLs by mouth 2 (two) times daily between meals. 237 mL 12   flecainide (TAMBOCOR) 100 MG tablet Take 100 mg by mouth every 12 (twelve) hours.      fluticasone (FLONASE) 50 MCG/ACT nasal spray Place 1 spray into both nostrils daily as needed for allergies or rhinitis.     hydroxypropyl methylcellulose / hypromellose (ISOPTO TEARS / GONIOVISC) 2.5 % ophthalmic solution Place 2 drops into both eyes 2 (two) times daily as needed for dry eyes.     lidocaine-prilocaine (EMLA) cream Apply to affected area once 30 g 3   LORazepam (ATIVAN) 0.5 MG tablet Take 1 tablet (0.5 mg total) by mouth every 6 (six) hours as needed (Nausea or vomiting). 30 tablet 0   metoprolol tartrate (LOPRESSOR) 25 MG tablet Take 12.5 mg by mouth 2 (two) times daily. Take with flecainide     mirtazapine (REMERON) 30 MG tablet Take 30 mg by mouth at bedtime.     omeprazole (PRILOSEC) 20 MG capsule Take 20 mg by mouth daily before breakfast.     ondansetron (ZOFRAN) 8 MG tablet Take 1 tablet (8 mg total) by mouth every 8 (eight) hours as needed for nausea or vomiting. Start on the third day after cyclophosphamide chemotherapy. 30 tablet 1   potassium chloride SA (KLOR-CON M) 20 MEQ tablet Take 1 tablet (20 mEq total) by mouth 2 (two) times daily. 60 tablet 0   prazosin (MINIPRESS) 2 MG capsule Take 6 mg by mouth at bedtime.     predniSONE (DELTASONE) 20 MG tablet Take 3 tablets (60 mg total) by mouth daily. Take with food on days 1-5 of chemotherapy. 25 tablet 5   prochlorperazine (COMPAZINE) 10 MG tablet Take 1 tablet (10 mg total) by mouth every 6 (six) hours as needed for nausea or vomiting. 30 tablet 6   terbinafine (LAMISIL) 1 % cream Apply 1 application topically 2 (two) times daily as needed (athlete's foot).     torsemide (DEMADEX) 10 MG tablet Take 20 mg by mouth daily.     triamcinolone cream (KENALOG) 0.1 % Apply 1 application topically 2 (two) times daily as needed (rash).     No current facility-administered medications for this visit.    REVIEW OF SYSTEMS:   .10 Point review of Systems was done is negative except as noted above.  PHYSICAL EXAMINATION: .BP  106/69   Pulse 69   Temp 98.1 F (36.7 C)   Resp 20   Wt 192 lb 6.4 oz (87.3 kg)   SpO2 100%   BMI 23.42 kg/m   NAD GENERAL:alert, in no acute distress and comfortable SKIN: no acute rashes, no significant lesions EYES: conjunctiva are pink and non-injected, sclera anicteric OROPHARYNX: MMM, no exudates, no oropharyngeal erythema or ulceration NECK: supple, no JVD LYMPH: Significant new palpable lymphadenopathy in the cervical axillary areas as well as the inguinal areas LUNGS: clear to auscultation b/l with normal respiratory effort HEART: regular rate & rhythm ABDOMEN:  normoactive bowel sounds , non  tender, not distended. Extremity: no pedal edema PSYCH: alert & oriented x 3 with fluent speech NEURO: no focal motor/sensory deficits   LABORATORY DATA:  I have reviewed the data as listed  .    Latest Ref Rng & Units 01/09/2023   11:57 AM 12/29/2022    7:57 AM 12/19/2022    9:43 AM  CBC  WBC 4.0 - 10.5 K/uL 4.0  6.5  3.3   Hemoglobin 13.0 - 17.0 g/dL 8.5  8.0  8.3   Hematocrit 39.0 - 52.0 % 26.1  24.4  25.8   Platelets 150 - 400 K/uL 207  169  300   ANC 700     Latest Ref Rng & Units 01/09/2023   11:57 AM 12/29/2022    7:57 AM 12/19/2022    9:43 AM  CMP  Glucose 70 - 99 mg/dL 101  107  111   BUN 8 - 23 mg/dL 19  17  22    Creatinine 0.61 - 1.24 mg/dL 0.98  1.08  1.02   Sodium 135 - 145 mmol/L 143  140  140   Potassium 3.5 - 5.1 mmol/L 3.5  2.9  3.4   Chloride 98 - 111 mmol/L 108  104  106   CO2 22 - 32 mmol/L 29  29  28    Calcium 8.9 - 10.3 mg/dL 9.1  8.9  9.0   Total Protein 6.5 - 8.1 g/dL 6.5  6.8  6.5   Total Bilirubin 0.3 - 1.2 mg/dL 0.2  0.3  0.2   Alkaline Phos 38 - 126 U/L 69  70  62   AST 15 - 41 U/L 11  13  11    ALT 0 - 44 U/L 7  10  7    . Lab Results  Component Value Date   LDH 151 05/25/2022    Surgical Pathology  CASE: [id number]  PATIENT: Kemuel Poplar  Flow Pathology Report  Clinical history: Mantle Cell Lymphoma of lymph nodes of multiple   regions  DIAGNOSIS:  -Monoclonal B-cell population identified  -See comment   COMMENT:   The findings are consistent with involvement by previously known mantle  cell lymphoma.    RADIOGRAPHIC STUDIES: I have personally reviewed the radiological images as listed and agreed with the findings in the report.  No results found.  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  3) moderate to severe protein calorie malnutrition-much improved patient is gaining weight and has been eating much better. . Wt Readings from Last 3 Encounters:  12/19/22 191 lb 14.4 oz (87 kg)  11/28/22 192 lb 8 oz (87.3 kg)  11/21/22 195 lb 4.8 oz (88.6 kg)    PLAN: -Discussed lab results today, 01/09/2023, with the patient and his wife. CBC shows hemoglobin has improved from 8.0 to 8.5 and hematocrit has improved from 24.4 to 26.1.  CMP is sttable -Patient is scheduled for PET scan on 01/15/2023.  -Discussed that we will see if we need to change treatment plan after PET scan results.  -Recommended to stay well hydrated.  -  patient shows significant clinical response and no notable toxicity after 3rd cycle of R-mini CHOP. And is appropriate to proceed with C4 of R-mini CHOP -Patient can continue the treatment without any dose modifications.   -Answered all of patient's questions.    FOLLOW-UP: PET CT scan is scheduled on 01/15/2023  Return to clinic with Dr. Candise Che with port flush and labs in 9 to 10  days with appointment for 1 unit of PRBC.  Please schedule patient for next 2 cycles of chemotherapy with port flush labs and MD visits  The total time spent in the appointment was 32 minutes* .  All of the patient's questions were answered with apparent satisfaction. The patient knows to call the clinic with any problems, questions or concerns.   Wyvonnia Lora MD MS AAHIVMS [facility] Hematology/Oncology Physician [facility]  .*Total Encounter Time as defined by the Centers for Medicare and Medicaid Services includes, in addition to the face-to-face time of a patient visit (documented in the note above) non-face-to-face time: obtaining and reviewing outside history, ordering and reviewing medications, tests or procedures, care coordination (communications with other health care professionals or caregivers) and documentation in the medical record.   I, Ok Edwards, am acting as a Neurosurgeon for Wyvonnia Lora, MD. .I have reviewed the above documentation for accuracy and completeness, and I agree with the above. [name] MD

## 2023-01-11 ENCOUNTER — Inpatient Hospital Stay: Payer: Medicare Other

## 2023-01-11 ENCOUNTER — Encounter: Payer: Self-pay | Admitting: Hematology

## 2023-01-11 VITALS — BP 101/65 | HR 63 | Temp 98.5°F | Resp 18

## 2023-01-11 DIAGNOSIS — Z7189 Other specified counseling: Secondary | ICD-10-CM

## 2023-01-11 DIAGNOSIS — Z5112 Encounter for antineoplastic immunotherapy: Secondary | ICD-10-CM | POA: Diagnosis not present

## 2023-01-11 DIAGNOSIS — C8318 Mantle cell lymphoma, lymph nodes of multiple sites: Secondary | ICD-10-CM

## 2023-01-11 MED ORDER — PEGFILGRASTIM-CBQV 6 MG/0.6ML ~~LOC~~ SOSY
6.0000 mg | PREFILLED_SYRINGE | Freq: Once | SUBCUTANEOUS | Status: AC
  Administered 2023-01-11: 6 mg via SUBCUTANEOUS
  Filled 2023-01-11: qty 0.6

## 2023-01-11 NOTE — Patient Instructions (Signed)

## 2023-01-12 ENCOUNTER — Telehealth: Payer: Self-pay | Admitting: Hematology

## 2023-01-12 NOTE — Telephone Encounter (Signed)
Scheduled per 03/12 los, patient has been called and notified. 

## 2023-01-15 ENCOUNTER — Encounter: Payer: Self-pay | Admitting: Hematology

## 2023-01-15 ENCOUNTER — Encounter (HOSPITAL_COMMUNITY)
Admission: RE | Admit: 2023-01-15 | Discharge: 2023-01-15 | Disposition: A | Discharge status: Home / Self Care | Payer: Medicare Other | Source: Ambulatory Visit | Attending: Hematology | Admitting: Hematology

## 2023-01-15 DIAGNOSIS — C8318 Mantle cell lymphoma, lymph nodes of multiple sites: Secondary | ICD-10-CM | POA: Diagnosis not present

## 2023-01-15 LAB — GLUCOSE, CAPILLARY: Glucose-Capillary: 108 mg/dL — ABNORMAL HIGH (ref 70–99)

## 2023-01-15 MED ORDER — FLUDEOXYGLUCOSE F - 18 (FDG) INJECTION
10.0000 | Freq: Once | INTRAVENOUS | Status: AC | PRN
  Administered 2023-01-15: 9.55 via INTRAVENOUS

## 2023-01-17 ENCOUNTER — Other Ambulatory Visit: Payer: Self-pay

## 2023-01-17 DIAGNOSIS — C8318 Mantle cell lymphoma, lymph nodes of multiple sites: Secondary | ICD-10-CM

## 2023-01-17 DIAGNOSIS — Z7189 Other specified counseling: Secondary | ICD-10-CM

## 2023-01-18 ENCOUNTER — Inpatient Hospital Stay (HOSPITAL_BASED_OUTPATIENT_CLINIC_OR_DEPARTMENT_OTHER): Payer: Medicare Other | Admitting: Hematology

## 2023-01-18 ENCOUNTER — Inpatient Hospital Stay: Payer: Medicare Other

## 2023-01-18 VITALS — BP 124/77 | HR 63 | Temp 97.7°F | Resp 20 | Wt 188.7 lb

## 2023-01-18 DIAGNOSIS — C8318 Mantle cell lymphoma, lymph nodes of multiple sites: Secondary | ICD-10-CM

## 2023-01-18 DIAGNOSIS — Z5111 Encounter for antineoplastic chemotherapy: Secondary | ICD-10-CM

## 2023-01-18 DIAGNOSIS — E876 Hypokalemia: Secondary | ICD-10-CM

## 2023-01-18 DIAGNOSIS — Z5112 Encounter for antineoplastic immunotherapy: Secondary | ICD-10-CM | POA: Diagnosis not present

## 2023-01-18 DIAGNOSIS — E883 Tumor lysis syndrome: Secondary | ICD-10-CM

## 2023-01-18 LAB — CMP (CANCER CENTER ONLY)
ALT: 10 U/L (ref 0–44)
AST: 16 U/L (ref 15–41)
Albumin: 3.4 g/dL — ABNORMAL LOW (ref 3.5–5.0)
Alkaline Phosphatase: 66 U/L (ref 38–126)
Anion gap: 9 (ref 5–15)
BUN: 24 mg/dL — ABNORMAL HIGH (ref 8–23)
CO2: 29 mmol/L (ref 22–32)
Calcium: 9.2 mg/dL (ref 8.9–10.3)
Chloride: 102 mmol/L (ref 98–111)
Creatinine: 1.31 mg/dL — ABNORMAL HIGH (ref 0.61–1.24)
GFR, Estimated: 56 mL/min — ABNORMAL LOW (ref >60)
Glucose, Bld: 110 mg/dL — ABNORMAL HIGH (ref 70–99)
Potassium: 2.8 mmol/L — ABNORMAL LOW (ref 3.5–5.1)
Sodium: 140 mmol/L (ref 135–145)
Total Bilirubin: 0.3 mg/dL (ref 0.3–1.2)
Total Protein: 6.2 g/dL — ABNORMAL LOW (ref 6.5–8.1)

## 2023-01-18 LAB — CBC WITH DIFFERENTIAL (CANCER CENTER ONLY)
Abs Immature Granulocytes: 0.12 10*3/uL — ABNORMAL HIGH (ref 0.00–0.07)
Basophils Absolute: 0 10*3/uL (ref 0.0–0.1)
Basophils Relative: 1 %
Eosinophils Absolute: 0 10*3/uL (ref 0.0–0.5)
Eosinophils Relative: 1 %
HCT: 23.8 % — ABNORMAL LOW (ref 39.0–52.0)
Hemoglobin: 7.8 g/dL — ABNORMAL LOW (ref 13.0–17.0)
Immature Granulocytes: 3 %
Lymphocytes Relative: 8 %
Lymphs Abs: 0.4 10*3/uL — ABNORMAL LOW (ref 0.7–4.0)
MCH: 27.5 pg (ref 26.0–34.0)
MCHC: 32.8 g/dL (ref 30.0–36.0)
MCV: 83.8 fL (ref 80.0–100.0)
Monocytes Absolute: 0.4 10*3/uL (ref 0.1–1.0)
Monocytes Relative: 8 %
Neutro Abs: 3.4 10*3/uL (ref 1.7–7.7)
Neutrophils Relative %: 79 %
Platelet Count: 166 10*3/uL (ref 150–400)
RBC: 2.84 MIL/uL — ABNORMAL LOW (ref 4.22–5.81)
RDW: 16.3 % — ABNORMAL HIGH (ref 11.5–15.5)
WBC Count: 4.3 10*3/uL (ref 4.0–10.5)
nRBC: 0 % (ref 0.0–0.2)

## 2023-01-18 LAB — SAMPLE TO BLOOD BANK

## 2023-01-18 MED ORDER — SODIUM CHLORIDE 0.9% FLUSH
10.0000 mL | Freq: Once | INTRAVENOUS | Status: AC
  Administered 2023-01-18: 10 mL

## 2023-01-18 NOTE — Progress Notes (Signed)
HEMATOLOGY/ONCOLOGY CLINIC VISIT NOTE  Date of Service: 01/18/23  Patient Care Team: Patient, No Pcp Per as PCP - General (General Practice)  CHIEF COMPLAINTS/PURPOSE OF CONSULTATION:  Follow-up for continued evaluation and management of relapsed mantle cell lymphoma  HISTORY OF PRESENTING ILLNESS:  Please see previous note for details of initial presentation  INTERVAL HISTORY  Jerry Tran is here for continued evaluation and management of his relapsed mantle cell lymphoma. Patient was last seen by me on 01/09/2023 and complained of mild bilateral leg edema and mild diarrhea, which he attributed to lactose intolerance. He also reported that his voice had changed in the prior months.   Today, he is accompanied by his wife. He reports that his throat heaviness has completely resolved. He denies any swallowing problems and denies any toxicities with his last treatment. Patient also denies any SOB or chest pain.  He did experience some lightheadedness earlier today, but this has resolved currently. Patient does complain of a loss of appetite for the last couple of days and some mild LE. His wife reports that she did need to call the ED due to pt experiencing urinary frequency.  MEDICAL HISTORY:  Past Medical History:  Diagnosis Date   Arthritis    Chronic kidney disease, stage 3b ([facility]) 12/09/2021   Dyslipidemia    GERD (gastroesophageal reflux disease)    Hypertension    PAF (paroxysmal atrial fibrillation) (HCC)    Prostate cancer ([facility]) 03/2021   s/p radiation therapy   PTSD (post-traumatic stress disorder)     SURGICAL HISTORY: Past Surgical History:  Procedure Laterality Date   IR IMAGING GUIDED PORT INSERTION  11/06/2022   LIPOMA EXCISION Right 12/09/2021   Procedure: RIGHT SUPERCLAVICULAR LYMPH NODE EXCISION;  Surgeon: Ileana Roup, MD;  Location: [facility] OR;  Service: General;  Laterality: Right;    SOCIAL HISTORY: Social History   Socioeconomic History   Marital  status: Married    Spouse name: Not on file   Number of children: Not on file   Years of education: Not on file   Highest education level: Not on file  Occupational History   Occupation: retired  Tobacco Use   Smoking status: Former    Packs/day: 1.00    Years: 15.00    Additional pack years: 0.00    Total pack years: 15.00    Types: Cigarettes   Smokeless tobacco: Never  Substance and Sexual Activity   Alcohol use: Not Currently    Comment: h/o heavy use   Drug use: Not Currently    Types: Marijuana, Cocaine    Comment: remote   Sexual activity: Not on file  Other Topics Concern   Not on file  Social History Narrative   Not on file   Social Determinants of Health   Financial Resource Strain: Not on file  Food Insecurity: Not on file  Transportation Needs: Not on file  Physical Activity: Not on file  Stress: Not on file  Social Connections: Not on file  Intimate Partner Violence: Not on file    FAMILY HISTORY: Family History  Problem Relation Age of Onset   Cancer Neg Hx     ALLERGIES:  is allergic to sildenafil.  MEDICATIONS:  Current Outpatient Medications  Medication Sig Dispense Refill   acyclovir (ZOVIRAX) 400 MG tablet Take 1 tablet (400 mg total) by mouth daily. 30 tablet 3   allopurinol (ZYLOPRIM) 100 MG tablet Take 1 tablet (100 mg total) by mouth 2 (two) times daily. [place]  tablet 0   ammonium lactate (LAC-HYDRIN) 12 % lotion Apply 1 application topically 2 (two) times daily as needed for dry skin.     atorvastatin (LIPITOR) 20 MG tablet Take 10 mg by mouth at bedtime.     Cholecalciferol (VITAMIN D3 PO) Take 1 tablet by mouth every morning.     diclofenac Sodium (VOLTAREN) 1 % GEL Apply 2 g topically 4 (four) times daily as needed (pain).     feeding supplement (ENSURE ENLIVE / ENSURE PLUS) LIQD Take 237 mLs by mouth 2 (two) times daily between meals. 237 mL 12   flecainide (TAMBOCOR) 100 MG tablet Take 100 mg by mouth every 12 (twelve) hours.      fluticasone (FLONASE) 50 MCG/ACT nasal spray Place 1 spray into both nostrils daily as needed for allergies or rhinitis.     furosemide (LASIX) 20 MG tablet Take 20 mg by mouth.     hydroxypropyl methylcellulose / hypromellose (ISOPTO TEARS / GONIOVISC) 2.5 % ophthalmic solution Place 2 drops into both eyes 2 (two) times daily as needed for dry eyes.     lidocaine-prilocaine (EMLA) cream Apply to affected area once 30 g 3   LORazepam (ATIVAN) 0.5 MG tablet Take 1 tablet (0.5 mg total) by mouth every 6 (six) hours as needed (Nausea or vomiting). 30 tablet 0   metoprolol tartrate (LOPRESSOR) 25 MG tablet Take 12.5 mg by mouth 2 (two) times daily. Take with flecainide     mirtazapine (REMERON) 30 MG tablet Take 30 mg by mouth at bedtime.     omeprazole (PRILOSEC) 20 MG capsule Take 20 mg by mouth daily before breakfast.     ondansetron (ZOFRAN) 8 MG tablet Take 1 tablet (8 mg total) by mouth every 8 (eight) hours as needed for nausea or vomiting. Start on the third day after cyclophosphamide chemotherapy. 30 tablet 1   potassium chloride SA (KLOR-CON M) 20 MEQ tablet Take 1 tablet (20 mEq total) by mouth 2 (two) times daily. 60 tablet 0   prazosin (MINIPRESS) 2 MG capsule Take 6 mg by mouth at bedtime.     predniSONE (DELTASONE) 20 MG tablet Take 3 tablets (60 mg total) by mouth daily. Take with food on days 1-5 of chemotherapy. 25 tablet 5   prochlorperazine (COMPAZINE) 10 MG tablet Take 1 tablet (10 mg total) by mouth every 6 (six) hours as needed for nausea or vomiting. 30 tablet 6   terbinafine (LAMISIL) 1 % cream Apply 1 application topically 2 (two) times daily as needed (athlete's foot).     torsemide (DEMADEX) 10 MG tablet Take 20 mg by mouth daily.     triamcinolone cream (KENALOG) 0.1 % Apply 1 application topically 2 (two) times daily as needed (rash).     No current facility-administered medications for this visit.    REVIEW OF SYSTEMS:    10 Point review of Systems was done is  negative except as noted above.   PHYSICAL EXAMINATION: .BP 124/77   Pulse 63   Temp 97.7 F (36.5 C)   Resp 20   Wt 188 lb 11.2 oz (85.6 kg)   SpO2 100%   BMI 22.97 kg/m  GENERAL:alert, in no acute distress and comfortable SKIN: no acute rashes, no significant lesions EYES: conjunctiva are pink and non-injected, sclera anicteric OROPHARYNX: MMM, no exudates, no oropharyngeal erythema or ulceration NECK: supple, no JVD LYMPH:  no palpable lymphadenopathy in the cervical, axillary or inguinal regions LUNGS: clear to auscultation b/l with normal respiratory effort HEART:  regular rate & rhythm ABDOMEN:  normoactive bowel sounds , non tender, not distended. Extremity: no pedal edema PSYCH: alert & oriented x 3 with fluent speech NEURO: no focal motor/sensory deficits    LABORATORY DATA:  I have reviewed the data as listed  .    Latest Ref Rng & Units 01/18/2023    8:57 AM 01/09/2023   11:57 AM 12/29/2022    7:57 AM  CBC  WBC 4.0 - 10.5 K/uL 4.3  4.0  6.5   Hemoglobin 13.0 - 17.0 g/dL 7.8  8.5  8.0   Hematocrit 39.0 - 52.0 % 23.8  26.1  24.4   Platelets 150 - 400 K/uL 166  207  169   ANC 700     Latest Ref Rng & Units 01/18/2023    8:57 AM 01/09/2023   11:57 AM 12/29/2022    7:57 AM  CMP  Glucose 70 - 99 mg/dL 110  101  107   BUN 8 - 23 mg/dL 24  19  17    Creatinine 0.61 - 1.24 mg/dL 1.31  0.98  1.08   Sodium 135 - 145 mmol/L 140  143  140   Potassium 3.5 - 5.1 mmol/L 2.8  3.5  2.9   Chloride 98 - 111 mmol/L 102  108  104   CO2 22 - 32 mmol/L 29  29  29    Calcium 8.9 - 10.3 mg/dL 9.2  9.1  8.9   Total Protein 6.5 - 8.1 g/dL 6.2  6.5  6.8   Total Bilirubin 0.3 - 1.2 mg/dL 0.3  0.2  0.3   Alkaline Phos 38 - 126 U/L 66  69  70   AST 15 - 41 U/L 16  11  13    ALT 0 - 44 U/L 10  7  10    . Lab Results  Component Value Date   LDH 151 05/25/2022    Surgical Pathology  CASE: [id number]  PATIENT: Yazen Jiron  Flow Pathology Report  Clinical history: Mantle Cell  Lymphoma of lymph nodes of multiple  regions  DIAGNOSIS:  -Monoclonal B-cell population identified  -See comment   COMMENT:   The findings are consistent with involvement by previously known mantle  cell lymphoma.    RADIOGRAPHIC STUDIES: I have personally reviewed the radiological images as listed and agreed with the findings in the report.  NM PET Image Restag (PS) Skull Base To Thigh  Result Date: 01/16/2023 CLINICAL DATA:  Subsequent treatment strategy for mantle cell lymphoma. EXAM: NUCLEAR MEDICINE PET SKULL BASE TO THIGH TECHNIQUE: 9.6 mCi F-18 FDG was injected intravenously. Full-ring PET imaging was performed from the skull base to thigh after the radiotracer. CT data was obtained and used for attenuation correction and anatomic localization. Fasting blood glucose: 108 mg/dl COMPARISON:  10/09/2022. FINDINGS: Mediastinal blood pool activity: SUV max 1.6 Liver activity: SUV max 2.9 NECK: Decreased bulk of central nasopharyngeal soft tissue mass, now measuring 1.9 x 3.0 cm, SUV max 8.5 compared to 10.0 previously. Small residual lymph nodes in the neck. Index right level 2 lymph node measures 6 mm (4/22), SUV max 2.9. Hypermetabolic soft tissue thickening and/or debris in the right oropharynx (4/23), SUV max 6.7. Incidental CT findings: None. CHEST: No residual hypermetabolic adenopathy. Previously seen hypermetabolic left gynecomastia does not show metabolism above blood pool. Incidental CT findings: Right IJ Port-A-Cath terminates in the right atrium. Atherosclerotic calcification of the aorta. Heart is enlarged. No pericardial effusion. Trace bilateral pleural effusions. ABDOMEN/PELVIS: Spleen is mildly hypermetabolic  with respect of the liver, SUV max 3.4. No residual hypermetabolic adenopathy in the abdomen or pelvis. Dominant right inguinal lymph node measures 1.4 cm (4/186), SUV max 2.1, compared to 3.2 cm and 5.4 previously. Incidental CT findings: Low-attenuation lesions in the liver  are too small to characterize. Liver, gallbladder, adrenal glands, kidneys, spleen, pancreas, stomach and bowel are grossly unremarkable. Small pelvic free fluid. SKELETON: Extensive uptake throughout the osseous structures, new from prior. Incidental CT findings: Degenerative changes in the spine. IMPRESSION: 1. Interval response to therapy as evidenced by decrease in size and hypermetabolism involving the nasopharyngeal mass as well as lymph nodes throughout the neck, chest, abdomen and pelvis (nasopharynx and spleen [place] 4, elsewhere [place] 3). 2. Diffuse uptake throughout the visualized osseous structures, presumably treatment related. 3. Trace bilateral pleural effusions. 4.  Aortic atherosclerosis (ICD10-I70.0). Electronically Signed   By: Lorin Picket M.D.   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  3) moderate to severe protein calorie malnutrition-much improved patient is gaining weight and has been eating much better. . Wt Readings from Last 3 Encounters:  01/09/23 192 lb 6.4 oz (87.3 kg)  12/19/22 191 lb 14.4 oz (87 kg)  11/28/22 192 lb 8 oz (87.3 kg)    PLAN:  -Discussed lab results on 01/18/2023 in detail with patient. CBC showed WBC of 4.3K, hemoglobin of 7.8, and platelets of 166K. -slightly anemic -potassium 2.8-- recommended to increase the Potassium intake to 48meq bid for 1 week and then back to 18meq po BID and f/u with cardiology for  K+ monitoring on his diuretics -PET scan 01/15/2023 revealed Interval response to therapy as evidenced by decrease in size and hypermetabolism involving the nasopharyngeal mass as well as lymph nodes throughout the neck, chest, abdomen and pelvis, Diffuse uptake throughout the visualized osseous structures, Trace bilateral pleural effusions, and Aortic atherosclerosis. -Discussed that the mass has significantly decreased in size and most of the lymph nodes have shrunk -some lymph nodes in throat have decreased in size -Patient is having a good response to his treatment regimen according to PET scan -discussed possibility of aggressive maintenance treatment with Rituxan and additional targeted therapy -discussed option of receiving a transfusion as hemoglobin may drop further with next cycle of chemotherapy -Patient would like to continue to monitor hemoglobin levels at this time and will let us know if he feels any lightheadedness/dizziness -Recommend patient to wear compression socks to improve blood pressure and LE edema -Recommended patient to consume adequate amounts of food and water to improve energy levels -informed patient that urinary frequency may be associated with growth factor shots  FOLLOW-UP: Per integrated scheduling  The total time spent in the appointment was 30 minutes* .  All of the patient's questions were answered with apparent satisfaction. The patient knows to call the clinic with any problems, questions or concerns.   Sullivan Lone MD MS AAHIVMS [facility] Hematology/Oncology Physician [employer]  .*Total Encounter Time as defined by the Centers for Medicare and Medicaid  Services includes, in addition to the face-to-face time of a patient visit (documented in the note above) non-face-to-face time: obtaining and reviewing outside history, ordering and reviewing medications, tests or procedures, care coordination (communications with other health care  professionals or caregivers) and documentation in the medical record.    I,Mitra Faeizi,acting as a Education administrator for Sullivan Lone, MD.,have documented all relevant documentation on the behalf of Sullivan Lone, MD,as directed by  Sullivan Lone, MD while in the presence of Sullivan Lone, MD.   .I have reviewed the above documentation for accuracy and completeness, and I agree with the above. Brunetta Genera MD

## 2023-01-24 ENCOUNTER — Encounter: Payer: Self-pay | Admitting: Hematology

## 2023-01-29 ENCOUNTER — Telehealth: Payer: Self-pay

## 2023-01-29 MED FILL — Fosaprepitant Dimeglumine For IV Infusion 150 MG (Base Eq): INTRAVENOUS | Qty: 5 | Status: AC

## 2023-01-29 MED FILL — Dexamethasone Sodium Phosphate Inj 100 MG/10ML: INTRAMUSCULAR | Qty: 1 | Status: AC

## 2023-01-29 NOTE — Telephone Encounter (Signed)
Contacted pt's wife per Dr Irene Limbo (Pt's wife manages pt's medications) to confirm that the patient is taking the increased dose of his potassium at 5meq po BID.  Pt currently not taking any K at all. Wife stated she did not think he was supposed to be on it at this time. Instructed wife to have pt to restart K per prescription 20 meg po bid and will update Dede Query [facility] for pt's appointment tomorrow. Pt will restart K this pm.

## 2023-01-30 ENCOUNTER — Inpatient Hospital Stay: Payer: Medicare Other

## 2023-01-30 ENCOUNTER — Inpatient Hospital Stay (HOSPITAL_BASED_OUTPATIENT_CLINIC_OR_DEPARTMENT_OTHER): Payer: Medicare Other | Admitting: Physician Assistant

## 2023-01-30 ENCOUNTER — Inpatient Hospital Stay: Payer: Medicare Other | Attending: Hematology and Oncology

## 2023-01-30 ENCOUNTER — Other Ambulatory Visit: Payer: Self-pay

## 2023-01-30 VITALS — BP 151/85 | HR 60 | Temp 98.0°F | Resp 18

## 2023-01-30 DIAGNOSIS — E43 Unspecified severe protein-calorie malnutrition: Secondary | ICD-10-CM | POA: Diagnosis not present

## 2023-01-30 DIAGNOSIS — R5383 Other fatigue: Secondary | ICD-10-CM | POA: Insufficient documentation

## 2023-01-30 DIAGNOSIS — C8318 Mantle cell lymphoma, lymph nodes of multiple sites: Secondary | ICD-10-CM

## 2023-01-30 DIAGNOSIS — I700 Atherosclerosis of aorta: Secondary | ICD-10-CM | POA: Insufficient documentation

## 2023-01-30 DIAGNOSIS — Z7189 Other specified counseling: Secondary | ICD-10-CM

## 2023-01-30 DIAGNOSIS — E876 Hypokalemia: Secondary | ICD-10-CM | POA: Diagnosis not present

## 2023-01-30 DIAGNOSIS — Z5189 Encounter for other specified aftercare: Secondary | ICD-10-CM | POA: Diagnosis not present

## 2023-01-30 DIAGNOSIS — I480 Paroxysmal atrial fibrillation: Secondary | ICD-10-CM | POA: Diagnosis not present

## 2023-01-30 DIAGNOSIS — D649 Anemia, unspecified: Secondary | ICD-10-CM

## 2023-01-30 DIAGNOSIS — Z8546 Personal history of malignant neoplasm of prostate: Secondary | ICD-10-CM | POA: Insufficient documentation

## 2023-01-30 DIAGNOSIS — Z87891 Personal history of nicotine dependence: Secondary | ICD-10-CM | POA: Diagnosis not present

## 2023-01-30 DIAGNOSIS — Z7952 Long term (current) use of systemic steroids: Secondary | ICD-10-CM | POA: Insufficient documentation

## 2023-01-30 DIAGNOSIS — J90 Pleural effusion, not elsewhere classified: Secondary | ICD-10-CM | POA: Insufficient documentation

## 2023-01-30 DIAGNOSIS — Z5112 Encounter for antineoplastic immunotherapy: Secondary | ICD-10-CM | POA: Diagnosis present

## 2023-01-30 DIAGNOSIS — D696 Thrombocytopenia, unspecified: Secondary | ICD-10-CM | POA: Insufficient documentation

## 2023-01-30 DIAGNOSIS — I129 Hypertensive chronic kidney disease with stage 1 through stage 4 chronic kidney disease, or unspecified chronic kidney disease: Secondary | ICD-10-CM | POA: Insufficient documentation

## 2023-01-30 DIAGNOSIS — Z5111 Encounter for antineoplastic chemotherapy: Secondary | ICD-10-CM | POA: Insufficient documentation

## 2023-01-30 DIAGNOSIS — Z79899 Other long term (current) drug therapy: Secondary | ICD-10-CM | POA: Insufficient documentation

## 2023-01-30 DIAGNOSIS — E883 Tumor lysis syndrome: Secondary | ICD-10-CM | POA: Insufficient documentation

## 2023-01-30 DIAGNOSIS — N1832 Chronic kidney disease, stage 3b: Secondary | ICD-10-CM | POA: Diagnosis not present

## 2023-01-30 DIAGNOSIS — E785 Hyperlipidemia, unspecified: Secondary | ICD-10-CM | POA: Insufficient documentation

## 2023-01-30 LAB — CBC WITH DIFFERENTIAL (CANCER CENTER ONLY)
Abs Immature Granulocytes: 0.04 10*3/uL (ref 0.00–0.07)
Basophils Absolute: 0 10*3/uL (ref 0.0–0.1)
Basophils Relative: 1 %
Eosinophils Absolute: 0 10*3/uL (ref 0.0–0.5)
Eosinophils Relative: 0 %
HCT: 23.9 % — ABNORMAL LOW (ref 39.0–52.0)
Hemoglobin: 7.7 g/dL — ABNORMAL LOW (ref 13.0–17.0)
Immature Granulocytes: 1 %
Lymphocytes Relative: 13 %
Lymphs Abs: 0.4 10*3/uL — ABNORMAL LOW (ref 0.7–4.0)
MCH: 27.6 pg (ref 26.0–34.0)
MCHC: 32.2 g/dL (ref 30.0–36.0)
MCV: 85.7 fL (ref 80.0–100.0)
Monocytes Absolute: 0.2 10*3/uL (ref 0.1–1.0)
Monocytes Relative: 7 %
Neutro Abs: 2.4 10*3/uL (ref 1.7–7.7)
Neutrophils Relative %: 78 %
Platelet Count: 258 10*3/uL (ref 150–400)
RBC: 2.79 MIL/uL — ABNORMAL LOW (ref 4.22–5.81)
RDW: 16.9 % — ABNORMAL HIGH (ref 11.5–15.5)
WBC Count: 3.1 10*3/uL — ABNORMAL LOW (ref 4.0–10.5)
nRBC: 0 % (ref 0.0–0.2)

## 2023-01-30 LAB — CMP (CANCER CENTER ONLY)
ALT: 7 U/L (ref 0–44)
AST: 12 U/L — ABNORMAL LOW (ref 15–41)
Albumin: 3.5 g/dL (ref 3.5–5.0)
Alkaline Phosphatase: 59 U/L (ref 38–126)
Anion gap: 5 (ref 5–15)
BUN: 18 mg/dL (ref 8–23)
CO2: 30 mmol/L (ref 22–32)
Calcium: 9.5 mg/dL (ref 8.9–10.3)
Chloride: 107 mmol/L (ref 98–111)
Creatinine: 1.13 mg/dL (ref 0.61–1.24)
GFR, Estimated: >60 mL/min (ref >60)
Glucose, Bld: 100 mg/dL — ABNORMAL HIGH (ref 70–99)
Potassium: 3.3 mmol/L — ABNORMAL LOW (ref 3.5–5.1)
Sodium: 142 mmol/L (ref 135–145)
Total Bilirubin: 0.3 mg/dL (ref 0.3–1.2)
Total Protein: 6.4 g/dL — ABNORMAL LOW (ref 6.5–8.1)

## 2023-01-30 LAB — PREPARE RBC (CROSSMATCH)

## 2023-01-30 MED ORDER — SODIUM CHLORIDE 0.9 % IV SOLN
150.0000 mg | Freq: Once | INTRAVENOUS | Status: AC
  Administered 2023-01-30: 150 mg via INTRAVENOUS
  Filled 2023-01-30: qty 150

## 2023-01-30 MED ORDER — POTASSIUM CHLORIDE CRYS ER 20 MEQ PO TBCR
40.0000 meq | EXTENDED_RELEASE_TABLET | Freq: Once | ORAL | Status: AC
  Administered 2023-01-30: 40 meq via ORAL
  Filled 2023-01-30: qty 2

## 2023-01-30 MED ORDER — FAMOTIDINE IN NACL 20-0.9 MG/50ML-% IV SOLN
20.0000 mg | Freq: Once | INTRAVENOUS | Status: AC
  Administered 2023-01-30: 20 mg via INTRAVENOUS
  Filled 2023-01-30: qty 50

## 2023-01-30 MED ORDER — SODIUM CHLORIDE 0.9 % IV SOLN
375.0000 mg/m2 | Freq: Once | INTRAVENOUS | Status: AC
  Administered 2023-01-30: 800 mg via INTRAVENOUS
  Filled 2023-01-30: qty 50

## 2023-01-30 MED ORDER — SODIUM CHLORIDE 0.9% FLUSH
10.0000 mL | INTRAVENOUS | Status: DC | PRN
  Administered 2023-01-30: 10 mL

## 2023-01-30 MED ORDER — ACETAMINOPHEN 325 MG PO TABS
650.0000 mg | ORAL_TABLET | Freq: Once | ORAL | Status: AC
  Administered 2023-01-30: 650 mg via ORAL
  Filled 2023-01-30: qty 2

## 2023-01-30 MED ORDER — DOXORUBICIN HCL CHEMO IV INJECTION 2 MG/ML
25.0000 mg/m2 | Freq: Once | INTRAVENOUS | Status: AC
  Administered 2023-01-30: 56 mg via INTRAVENOUS
  Filled 2023-01-30: qty 28

## 2023-01-30 MED ORDER — DIPHENHYDRAMINE HCL 25 MG PO CAPS
50.0000 mg | ORAL_CAPSULE | Freq: Once | ORAL | Status: AC
  Administered 2023-01-30: 50 mg via ORAL
  Filled 2023-01-30: qty 2

## 2023-01-30 MED ORDER — VINCRISTINE SULFATE CHEMO INJECTION 1 MG/ML
1.0000 mg | Freq: Once | INTRAVENOUS | Status: AC
  Administered 2023-01-30: 1 mg via INTRAVENOUS
  Filled 2023-01-30: qty 1

## 2023-01-30 MED ORDER — HEPARIN SOD (PORK) LOCK FLUSH 100 UNIT/ML IV SOLN
500.0000 [IU] | Freq: Once | INTRAVENOUS | Status: AC | PRN
  Administered 2023-01-30: 500 [IU]

## 2023-01-30 MED ORDER — SODIUM CHLORIDE 0.9 % IV SOLN: Freq: Once | INTRAVENOUS | Status: AC

## 2023-01-30 MED ORDER — SODIUM CHLORIDE 0.9 % IV SOLN
400.0000 mg/m2 | Freq: Once | INTRAVENOUS | Status: AC
  Administered 2023-01-30: 880 mg via INTRAVENOUS
  Filled 2023-01-30: qty 44

## 2023-01-30 MED ORDER — SODIUM CHLORIDE 0.9% FLUSH
10.0000 mL | Freq: Once | INTRAVENOUS | Status: AC
  Administered 2023-01-30: 10 mL

## 2023-01-30 MED ORDER — PALONOSETRON HCL INJECTION 0.25 MG/5ML
0.2500 mg | Freq: Once | INTRAVENOUS | Status: AC
  Administered 2023-01-30: 0.25 mg via INTRAVENOUS
  Filled 2023-01-30: qty 5

## 2023-01-30 MED ORDER — SODIUM CHLORIDE 0.9 % IV SOLN
10.0000 mg | Freq: Once | INTRAVENOUS | Status: AC
  Administered 2023-01-30: 10 mg via INTRAVENOUS
  Filled 2023-01-30: qty 10

## 2023-01-30 MED ORDER — HYDROCORTISONE ACETATE 25 MG RE SUPP: 25.0000 mg | Freq: Two times a day (BID) | RECTAL | 0 refills | Status: DC

## 2023-01-30 NOTE — Patient Instructions (Signed)
[facility]  Discharge Instructions: Thank you for choosing [facility] to provide your oncology and hematology care.   If you have a lab appointment with the [facility], please go directly to the [facility] and check in at the registration area.   Wear comfortable clothing and clothing appropriate for easy access to any Portacath or PICC line.   We strive to give you quality time with your provider. You may need to reschedule your appointment if you arrive late (15 or more minutes).  Arriving late affects you and other patients whose appointments are after yours.  Also, if you miss three or more appointments without notifying the office, you may be dismissed from the clinic at the provider's discretion.      For prescription refill requests, have your pharmacy contact our office and allow 72 hours for refills to be completed.    Today you received the following chemotherapy and/or immunotherapy agents Adriamycin, Vincristine, Cytoxan, Rituxumab.      To help prevent nausea and vomiting after your treatment, we encourage you to take your nausea medication as directed.  BELOW ARE SYMPTOMS THAT SHOULD BE REPORTED IMMEDIATELY: *FEVER GREATER THAN 100.4 F (38 C) OR HIGHER *CHILLS OR SWEATING *NAUSEA AND VOMITING THAT IS NOT CONTROLLED WITH YOUR NAUSEA MEDICATION *UNUSUAL SHORTNESS OF BREATH *UNUSUAL BRUISING OR BLEEDING *URINARY PROBLEMS (pain or burning when urinating, or frequent urination) *BOWEL PROBLEMS (unusual diarrhea, constipation, pain near the anus) TENDERNESS IN MOUTH AND THROAT WITH OR WITHOUT PRESENCE OF ULCERS (sore throat, sores in mouth, or a toothache) UNUSUAL RASH, SWELLING OR PAIN  UNUSUAL VAGINAL DISCHARGE OR ITCHING   Items with * indicate a potential emergency and should be followed up as soon as possible or go to the Emergency Department if any problems should occur.  Please show the CHEMOTHERAPY ALERT CARD or  IMMUNOTHERAPY ALERT CARD at check-in to the Emergency Department and triage nurse.  Should you have questions after your visit or need to cancel or reschedule your appointment, please contact [facility]  Dept: [phone]  and follow the prompts.  Office hours are 8:00 a.m. to 4:30 p.m. Monday - Friday. Please note that voicemails left after 4:00 p.m. may not be returned until the following business day.  We are closed weekends and major holidays. You have access to a nurse at all times for urgent questions. Please call the main number to the clinic Dept: [phone] and follow the prompts.   For any non-urgent questions, you may also contact your provider using MyChart. We now offer e-Visits for anyone 24 and older to request care online for non-urgent symptoms. For details visit [email or web address].   Also download the MyChart app! Go to the app store, search "MyChart", open the app, select , and log in with your MyChart username and password.

## 2023-01-30 NOTE — Progress Notes (Signed)
Per Joyice Faster, PA, patient is ok for treatment today with Hgb 7.7. Will get blood transfusion tomorrow.

## 2023-01-30 NOTE — Progress Notes (Signed)
HEMATOLOGY/ONCOLOGY CLINIC VISIT NOTE  Date of Service: 01/30/23  Patient Care Team: Patient, No Pcp Per as PCP - General (General Practice)  CHIEF COMPLAINTS/PURPOSE OF CONSULTATION:  Follow-up for continued evaluation and management of relapsed mantle cell lymphoma  HISTORY OF PRESENTING ILLNESS:  Please see previous note for details of initial presentation  INTERVAL HISTORY  Jerry Tran is here for continued evaluation and management of his relapsed mantle cell lymphoma. Patient was last seen by Dr. Yordan Martindale Limbo on 01/18/2023. He presents today for Cycle 6 of R-CHOP. He is accompanied by his wife for this visit.   Today, Jerry Tran reports his energy levels are fairly stable. He does have fatigue but can complete her ADLs on his own. He reports having a good appetite. He denies nausea, vomiting or abdominal pain. He is struggling with hemorrhoidal pain and adds having some straining with a bowel movement. He started taking senokot yesterday. He denies any signs of bleeding including hematochezia or melena. He denies fevers, chills, sweats, shortness of breath, chest pain or cough. He has no other complaints.    MEDICAL HISTORY:  Past Medical History:  Diagnosis Date   Arthritis    Chronic kidney disease, stage 3b ([place]) 12/09/2021   Dyslipidemia    GERD (gastroesophageal reflux disease)    Hypertension    PAF (paroxysmal atrial fibrillation) (HCC)    Prostate cancer ([place]) 03/2021   s/p radiation therapy   PTSD (post-traumatic stress disorder)     SURGICAL HISTORY: Past Surgical History:  Procedure Laterality Date   IR IMAGING GUIDED PORT INSERTION  11/06/2022   LIPOMA EXCISION Right 12/09/2021   Procedure: RIGHT SUPERCLAVICULAR LYMPH NODE EXCISION;  Surgeon: Ileana Roup, MD;  Location: [facility] OR;  Service: General;  Laterality: Right;    SOCIAL HISTORY: Social History   Socioeconomic History   Marital status: Married    Spouse name: Not on file   Number of children: Not  on file   Years of education: Not on file   Highest education level: Not on file  Occupational History   Occupation: retired  Tobacco Use   Smoking status: Former    Packs/day: 1.00    Years: 15.00    Additional pack years: 0.00    Total pack years: 15.00    Types: Cigarettes   Smokeless tobacco: Never  Substance and Sexual Activity   Alcohol use: Not Currently    Comment: h/o heavy use   Drug use: Not Currently    Types: Marijuana, Cocaine    Comment: remote   Sexual activity: Not on file  Other Topics Concern   Not on file  Social History Narrative   Not on file   Social Determinants of Health   Financial Resource Strain: Not on file  Food Insecurity: Not on file  Transportation Needs: Not on file  Physical Activity: Not on file  Stress: Not on file  Social Connections: Not on file  Intimate Partner Violence: Not on file    FAMILY HISTORY: Family History  Problem Relation Age of Onset   Cancer Neg Hx     ALLERGIES:  is allergic to sildenafil.  MEDICATIONS:  Current Outpatient Medications  Medication Sig Dispense Refill   acyclovir (ZOVIRAX) 400 MG tablet Take 1 tablet (400 mg total) by mouth daily. 30 tablet 3   allopurinol (ZYLOPRIM) 100 MG tablet Take 1 tablet (100 mg total) by mouth 2 (two) times daily. 60 tablet 0   ammonium lactate (LAC-HYDRIN) 12 % lotion Apply  1 application topically 2 (two) times daily as needed for dry skin.     atorvastatin (LIPITOR) 20 MG tablet Take 10 mg by mouth at bedtime.     Cholecalciferol (VITAMIN D3 PO) Take 1 tablet by mouth every morning.     diclofenac Sodium (VOLTAREN) 1 % GEL Apply 2 g topically 4 (four) times daily as needed (pain).     feeding supplement (ENSURE ENLIVE / ENSURE PLUS) LIQD Take 237 mLs by mouth 2 (two) times daily between meals. 237 mL 12   flecainide (TAMBOCOR) 100 MG tablet Take 100 mg by mouth every 12 (twelve) hours.     fluticasone (FLONASE) 50 MCG/ACT nasal spray Place 1 spray into both nostrils  daily as needed for allergies or rhinitis.     furosemide (LASIX) 20 MG tablet Take 20 mg by mouth.     hydrocortisone (ANUSOL-HC) 25 MG suppository Place 1 suppository (25 mg total) rectally 2 (two) times daily. 12 suppository 0   hydroxypropyl methylcellulose / hypromellose (ISOPTO TEARS / GONIOVISC) 2.5 % ophthalmic solution Place 2 drops into both eyes 2 (two) times daily as needed for dry eyes.     lidocaine-prilocaine (EMLA) cream Apply to affected area once 30 g 3   LORazepam (ATIVAN) 0.5 MG tablet Take 1 tablet (0.5 mg total) by mouth every 6 (six) hours as needed (Nausea or vomiting). 30 tablet 0   metoprolol tartrate (LOPRESSOR) 25 MG tablet Take 12.5 mg by mouth 2 (two) times daily. Take with flecainide     mirtazapine (REMERON) 30 MG tablet Take 30 mg by mouth at bedtime.     omeprazole (PRILOSEC) 20 MG capsule Take 20 mg by mouth daily before breakfast.     ondansetron (ZOFRAN) 8 MG tablet Take 1 tablet (8 mg total) by mouth every 8 (eight) hours as needed for nausea or vomiting. Start on the third day after cyclophosphamide chemotherapy. 30 tablet 1   potassium chloride SA (KLOR-CON M) 20 MEQ tablet Take 1 tablet (20 mEq total) by mouth 2 (two) times daily. 60 tablet 0   prazosin (MINIPRESS) 2 MG capsule Take 6 mg by mouth at bedtime.     predniSONE (DELTASONE) 20 MG tablet Take 3 tablets (60 mg total) by mouth daily. Take with food on days 1-5 of chemotherapy. 25 tablet 5   prochlorperazine (COMPAZINE) 10 MG tablet Take 1 tablet (10 mg total) by mouth every 6 (six) hours as needed for nausea or vomiting. 30 tablet 6   terbinafine (LAMISIL) 1 % cream Apply 1 application topically 2 (two) times daily as needed (athlete's foot).     torsemide (DEMADEX) 10 MG tablet Take 20 mg by mouth daily.     triamcinolone cream (KENALOG) 0.1 % Apply 1 application topically 2 (two) times daily as needed (rash).     No current facility-administered medications for this visit.   Facility-Administered  Medications Ordered in Other Visits  Medication Dose Route Frequency Provider Last Rate Last Admin   heparin lock flush 100 unit/mL  500 Units Intracatheter Once PRN Brunetta Genera, MD       sodium chloride flush (NS) 0.9 % injection 10 mL  10 mL Intracatheter PRN Brunetta Genera, MD        REVIEW OF SYSTEMS:    10 Point review of Systems was done is negative except as noted above.   PHYSICAL EXAMINATION: .There were no vitals taken for this visit. GENERAL:alert, in no acute distress and comfortable SKIN: no acute rashes, no  significant lesions EYES: conjunctiva are pink and non-injected, sclera anicteric LYMPH:  no palpable lymphadenopathy in the cervical and supraclavicular regions LUNGS: clear to auscultation b/l with normal respiratory effort HEART: regular rate & rhythm Extremity: no pedal edema PSYCH: alert & oriented x 3 with fluent speech NEURO: no focal motor/sensory deficits    LABORATORY DATA:  I have reviewed the data as listed  .    Latest Ref Rng & Units 01/30/2023   10:20 AM 01/18/2023    8:57 AM 01/09/2023   11:57 AM  CBC  WBC 4.0 - 10.5 K/uL 3.1  4.3  4.0   Hemoglobin 13.0 - 17.0 g/dL 7.7  7.8  8.5   Hematocrit 39.0 - 52.0 % 23.9  23.8  26.1   Platelets 150 - 400 K/uL 258  166  207   ANC 700     Latest Ref Rng & Units 01/30/2023   10:20 AM 01/18/2023    8:57 AM 01/09/2023   11:57 AM  CMP  Glucose 70 - 99 mg/dL 100  110  101   BUN 8 - 23 mg/dL 18  24  19    Creatinine 0.61 - 1.24 mg/dL 1.13  1.31  0.98   Sodium 135 - 145 mmol/L 142  140  143   Potassium 3.5 - 5.1 mmol/L 3.3  2.8  3.5   Chloride 98 - 111 mmol/L 107  102  108   CO2 22 - 32 mmol/L 30  29  29    Calcium 8.9 - 10.3 mg/dL 9.5  9.2  9.1   Total Protein 6.5 - 8.1 g/dL 6.4  6.2  6.5   Total Bilirubin 0.3 - 1.2 mg/dL 0.3  0.3  0.2   Alkaline Phos 38 - 126 U/L 59  66  69   AST 15 - 41 U/L 12  16  11    ALT 0 - 44 U/L 7  10  7    . Lab Results  Component Value Date   LDH 151 05/25/2022     Surgical Pathology  CASE: [id number]  PATIENT: Cortavious Cullipher  Flow Pathology Report  Clinical history: Mantle Cell Lymphoma of lymph nodes of multiple  regions  DIAGNOSIS:  -Monoclonal B-cell population identified  -See comment   COMMENT:   The findings are consistent with involvement by previously known mantle  cell lymphoma.    RADIOGRAPHIC STUDIES: I have personally reviewed the radiological images as listed and agreed with the findings in the report.  NM PET Image Restag (PS) Skull Base To Thigh  Result Date: 01/16/2023 CLINICAL DATA:  Subsequent treatment strategy for mantle cell lymphoma. EXAM: NUCLEAR MEDICINE PET SKULL BASE TO THIGH TECHNIQUE: 9.6 mCi F-18 FDG was injected intravenously. Full-ring PET imaging was performed from the skull base to thigh after the radiotracer. CT data was obtained and used for attenuation correction and anatomic localization. Fasting blood glucose: 108 mg/dl COMPARISON:  10/09/2022. FINDINGS: Mediastinal blood pool activity: SUV max 1.6 Liver activity: SUV max 2.9 NECK: Decreased bulk of central nasopharyngeal soft tissue mass, now measuring 1.9 x 3.0 cm, SUV max 8.5 compared to 10.0 previously. Small residual lymph nodes in the neck. Index right level 2 lymph node measures 6 mm (4/22), SUV max 2.9. Hypermetabolic soft tissue thickening and/or debris in the right oropharynx (4/23), SUV max 6.7. Incidental CT findings: None. CHEST: No residual hypermetabolic adenopathy. Previously seen hypermetabolic left gynecomastia does not show metabolism above blood pool. Incidental CT findings: Right IJ Port-A-Cath terminates in the right atrium. Atherosclerotic  calcification of the aorta. Heart is enlarged. No pericardial effusion. Trace bilateral pleural effusions. ABDOMEN/PELVIS: Spleen is mildly hypermetabolic with respect of the liver, SUV max 3.4. No residual hypermetabolic adenopathy in the abdomen or pelvis. Dominant right inguinal lymph node measures  1.4 cm (4/186), SUV max 2.1, compared to 3.2 cm and 5.4 previously. Incidental CT findings: Low-attenuation lesions in the liver are too small to characterize. Liver, gallbladder, adrenal glands, kidneys, spleen, pancreas, stomach and bowel are grossly unremarkable. Small pelvic free fluid. SKELETON: Extensive uptake throughout the osseous structures, new from prior. Incidental CT findings: Degenerative changes in the spine. IMPRESSION: 1. Interval response to therapy as evidenced by decrease in size and hypermetabolism involving the nasopharyngeal mass as well as lymph nodes throughout the neck, chest, abdomen and pelvis (nasopharynx and spleen Deau[place] 4, elsewhere [place] 3). 2. Diffuse uptake throughout the visualized osseous structures, presumably treatment related. 3. Trace bilateral pleural effusions. 4.  Aortic atherosclerosis (ICD10-I70.0). Electronically Signed   By: Lorin Picket M.D.   On: 01/16/2023 14:50    ASSESSMENT & PLAN:  Jagjit Klahn is a 77 y.o. male who presents to the clinic for a follow up for relapsed mantel cell lymphoma.   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 -Most recent PET scan from 01/15/2023 revealed interval response to therapy as evidenced by decrease in size and hypermetabolism involving the nasopharyngeal mass as well as lymph nodes throughout the neck, chest, abdomen and pelvis, Diffuse uptake throughout the visualized osseous structures, Trace bilateral pleural effusions, and Aortic atherosclerosis.  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  3) Moderate to severe protein calorie malnutrition: -Much improved patient is gaining weight and has been eating much better.    PLAN: -Due for Cycle 6, Day 1 of R-CHOP -Labs from today were reviewed and adequate for  treatment. WBC 3.1, Hg 7.7, Plt 258. K 3.3,  -Discussed lab results on 01/18/2023 in detail with patient. CBC showed WBC of 4.3K, hemoglobin of 7.8, and platelets of 166K. Creatinine and LFTs adequate.  -Patient recently started PO prescription of potassium chloride, did not take today's dose. Will give potassium chloride 40 mEq x 1 dose today with infusion -Will arrange of 1 unit of PRBC tomorrow.  -Encouraged stool softeners to help with straining and minimize  hemorrhoids. Sent anusol ointment to minimize hemorrhoidal pain.   FOLLOW-UP: Per integrated scheduling  All of the patient's questions were answered with apparent satisfaction. The patient knows to call the clinic with any problems, questions or concerns.  I have spent a total of 30 minutes minutes of face-to-face and non-face-to-face time, preparing to see the [place] a medically appropriate examination, counseling and educating the patient,  documenting clinical information in the electronic health record, and care coordination.   Dede Query PA-C Dept of Hematology and [place] [facility] Phone: [phone]

## 2023-01-31 ENCOUNTER — Inpatient Hospital Stay: Payer: Medicare Other

## 2023-01-31 DIAGNOSIS — Z5112 Encounter for antineoplastic immunotherapy: Secondary | ICD-10-CM | POA: Diagnosis not present

## 2023-01-31 DIAGNOSIS — D649 Anemia, unspecified: Secondary | ICD-10-CM

## 2023-01-31 MED ORDER — DIPHENHYDRAMINE HCL 25 MG PO CAPS
25.0000 mg | ORAL_CAPSULE | Freq: Once | ORAL | Status: AC
  Administered 2023-01-31: 25 mg via ORAL
  Filled 2023-01-31: qty 1

## 2023-01-31 MED ORDER — ACETAMINOPHEN 325 MG PO TABS
650.0000 mg | ORAL_TABLET | Freq: Once | ORAL | Status: AC
  Administered 2023-01-31: 650 mg via ORAL
  Filled 2023-01-31: qty 2

## 2023-01-31 MED ORDER — SODIUM CHLORIDE 0.9% IV SOLUTION
250.0000 mL | Freq: Once | INTRAVENOUS | Status: AC
  Administered 2023-01-31: 250 mL via INTRAVENOUS

## 2023-01-31 NOTE — Patient Instructions (Signed)

## 2023-02-01 ENCOUNTER — Inpatient Hospital Stay: Payer: Medicare Other

## 2023-02-01 VITALS — BP 131/81 | HR 56 | Temp 97.8°F | Resp 18

## 2023-02-01 DIAGNOSIS — Z7189 Other specified counseling: Secondary | ICD-10-CM

## 2023-02-01 DIAGNOSIS — C8318 Mantle cell lymphoma, lymph nodes of multiple sites: Secondary | ICD-10-CM

## 2023-02-01 DIAGNOSIS — Z5112 Encounter for antineoplastic immunotherapy: Secondary | ICD-10-CM | POA: Diagnosis not present

## 2023-02-01 LAB — TYPE AND SCREEN
ABO/RH(D): B POS
Antibody Screen: NEGATIVE
Unit division: 0

## 2023-02-01 LAB — BPAM RBC
Blood Product Expiration Date: 202404142359
ISSUE DATE / TIME: 202404031044
Unit Type and Rh: 7300

## 2023-02-01 MED ORDER — PEGFILGRASTIM-CBQV 6 MG/0.6ML ~~LOC~~ SOSY
6.0000 mg | PREFILLED_SYRINGE | Freq: Once | SUBCUTANEOUS | Status: AC
  Administered 2023-02-01: 6 mg via SUBCUTANEOUS
  Filled 2023-02-01: qty 0.6

## 2023-02-01 NOTE — Patient Instructions (Signed)

## 2023-02-07 IMAGING — PT NM PET TUM IMG RESTAG (PS) SKULL BASE T - THIGH
1 of 7 series · 1 of 25 positions shown · non-contrast
Comparison: PET-CT dated 12/28/2021

CLINICAL DATA: Subsequent treatment strategy for mantle cell
lymphoma.

EXAM:
NUCLEAR MEDICINE PET SKULL BASE TO THIGH
TECHNIQUE: 9.9 mCi F-18 FDG was injected intravenously. Full-ring PET imaging
was performed from the skull base to thigh after the radiotracer. CT
data was obtained and used for attenuation correction and anatomic
localization.
Fasting blood glucose: 86 mg/dl

[Series 4: ct sk_thigh 5.0 br38 · axial · 5.0mm · 0.98mm/px · 1 of 227 slices shown]
[im 227/227  brain]
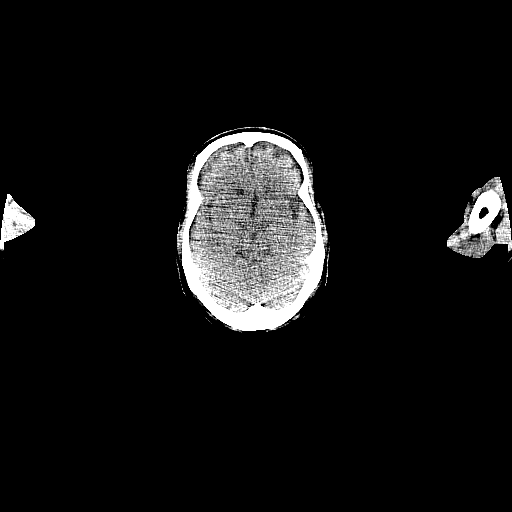

[1 of 25 positions shown; findings below may reference images not displayed]

FINDINGS: Mediastinal blood pool activity: SUV max

Liver activity: SUV max

NECK: No hypermetabolic cervical lymphadenopathy.

Incidental CT findings: none

CHEST: No hypermetabolic thoracic lymphadenopathy.

No suspicious pulmonary nodules.

Incidental CT findings: Mild atherosclerotic calcifications of the
aortic arch.

ABDOMEN/PELVIS: Small residual bilateral pelvic nodes, including:

--13 mm short axis left external iliac node (series 4/image 158),
max SUV

--11 mm short axis left pelvic sidewall node (series 4/image 165),
max SUV

--11 mm short axis right pelvic sidewall node (series 4/image 168),
max SUV

Spleen is within the upper limits of normal for size. Reference
splenic max SUV 2.2.

No abnormal hypermetabolism in the liver, pancreas, or adrenal
glands.

Incidental CT findings: Fiducial markers in the prostate. Mildly
thick-walled bladder, although underdistended.

SKELETON: No focal hypermetabolic activity to suggest skeletal
metastasis.

Incidental CT findings: Degenerative changes of the visualized
thoracolumbar spine.
IMPRESSION: Near complete resolution, with mild residual bilateral pelvic
lymphadenopathy, as above. [HOSPITAL] criteria 3.

## 2023-02-19 MED FILL — Dexamethasone Sodium Phosphate Inj 100 MG/10ML: INTRAMUSCULAR | Qty: 1 | Status: AC

## 2023-02-19 MED FILL — Fosaprepitant Dimeglumine For IV Infusion 150 MG (Base Eq): INTRAVENOUS | Qty: 5 | Status: AC

## 2023-02-20 ENCOUNTER — Inpatient Hospital Stay: Payer: Medicare Other

## 2023-02-20 ENCOUNTER — Inpatient Hospital Stay (HOSPITAL_BASED_OUTPATIENT_CLINIC_OR_DEPARTMENT_OTHER): Payer: Medicare Other | Admitting: Hematology

## 2023-02-20 VITALS — BP 125/84 | HR 64 | Resp 17

## 2023-02-20 VITALS — BP 111/70 | HR 69 | Temp 97.8°F | Resp 20 | Wt 180.8 lb

## 2023-02-20 DIAGNOSIS — C8318 Mantle cell lymphoma, lymph nodes of multiple sites: Secondary | ICD-10-CM | POA: Diagnosis not present

## 2023-02-20 DIAGNOSIS — R490 Dysphonia: Secondary | ICD-10-CM

## 2023-02-20 DIAGNOSIS — Z7189 Other specified counseling: Secondary | ICD-10-CM | POA: Diagnosis not present

## 2023-02-20 DIAGNOSIS — Z5112 Encounter for antineoplastic immunotherapy: Secondary | ICD-10-CM | POA: Diagnosis not present

## 2023-02-20 DIAGNOSIS — E883 Tumor lysis syndrome: Secondary | ICD-10-CM

## 2023-02-20 LAB — CBC WITH DIFFERENTIAL (CANCER CENTER ONLY)
Abs Immature Granulocytes: 0.14 10*3/uL — ABNORMAL HIGH (ref 0.00–0.07)
Basophils Absolute: 0 10*3/uL (ref 0.0–0.1)
Basophils Relative: 1 %
Eosinophils Absolute: 0 10*3/uL (ref 0.0–0.5)
Eosinophils Relative: 1 %
HCT: 29.7 % — ABNORMAL LOW (ref 39.0–52.0)
Hemoglobin: 9.6 g/dL — ABNORMAL LOW (ref 13.0–17.0)
Immature Granulocytes: 2 %
Lymphocytes Relative: 9 %
Lymphs Abs: 0.5 10*3/uL — ABNORMAL LOW (ref 0.7–4.0)
MCH: 27.9 pg (ref 26.0–34.0)
MCHC: 32.3 g/dL (ref 30.0–36.0)
MCV: 86.3 fL (ref 80.0–100.0)
Monocytes Absolute: 0.5 10*3/uL (ref 0.1–1.0)
Monocytes Relative: 8 %
Neutro Abs: 4.8 10*3/uL (ref 1.7–7.7)
Neutrophils Relative %: 79 %
Platelet Count: 194 10*3/uL (ref 150–400)
RBC: 3.44 MIL/uL — ABNORMAL LOW (ref 4.22–5.81)
RDW: 17 % — ABNORMAL HIGH (ref 11.5–15.5)
WBC Count: 6 10*3/uL (ref 4.0–10.5)
nRBC: 0 % (ref 0.0–0.2)

## 2023-02-20 LAB — CMP (CANCER CENTER ONLY)
ALT: 10 U/L (ref 0–44)
AST: 15 U/L (ref 15–41)
Albumin: 4 g/dL (ref 3.5–5.0)
Alkaline Phosphatase: 61 U/L (ref 38–126)
Anion gap: 6 (ref 5–15)
BUN: 28 mg/dL — ABNORMAL HIGH (ref 8–23)
CO2: 26 mmol/L (ref 22–32)
Calcium: 9.7 mg/dL (ref 8.9–10.3)
Chloride: 107 mmol/L (ref 98–111)
Creatinine: 1.21 mg/dL (ref 0.61–1.24)
GFR, Estimated: >60 mL/min (ref >60)
Glucose, Bld: 107 mg/dL — ABNORMAL HIGH (ref 70–99)
Potassium: 4.1 mmol/L (ref 3.5–5.1)
Sodium: 139 mmol/L (ref 135–145)
Total Bilirubin: 0.4 mg/dL (ref 0.3–1.2)
Total Protein: 6.9 g/dL (ref 6.5–8.1)

## 2023-02-20 MED ORDER — SODIUM CHLORIDE 0.9 % IV SOLN
150.0000 mg | Freq: Once | INTRAVENOUS | Status: AC
  Administered 2023-02-20: 150 mg via INTRAVENOUS
  Filled 2023-02-20: qty 150

## 2023-02-20 MED ORDER — SODIUM CHLORIDE 0.9 % IV SOLN
10.0000 mg | Freq: Once | INTRAVENOUS | Status: AC
  Administered 2023-02-20: 10 mg via INTRAVENOUS
  Filled 2023-02-20: qty 10

## 2023-02-20 MED ORDER — VINCRISTINE SULFATE CHEMO INJECTION 1 MG/ML
1.0000 mg | Freq: Once | INTRAVENOUS | Status: AC
  Administered 2023-02-20: 1 mg via INTRAVENOUS
  Filled 2023-02-20: qty 1

## 2023-02-20 MED ORDER — SODIUM CHLORIDE 0.9 % IV SOLN
400.0000 mg/m2 | Freq: Once | INTRAVENOUS | Status: AC
  Administered 2023-02-20: 880 mg via INTRAVENOUS
  Filled 2023-02-20: qty 44

## 2023-02-20 MED ORDER — SODIUM CHLORIDE 0.9 % IV SOLN: Freq: Once | INTRAVENOUS | Status: AC

## 2023-02-20 MED ORDER — DOXORUBICIN HCL CHEMO IV INJECTION 2 MG/ML
25.0000 mg/m2 | Freq: Once | INTRAVENOUS | Status: AC
  Administered 2023-02-20: 56 mg via INTRAVENOUS
  Filled 2023-02-20: qty 28

## 2023-02-20 MED ORDER — DIPHENHYDRAMINE HCL 25 MG PO CAPS
50.0000 mg | ORAL_CAPSULE | Freq: Once | ORAL | Status: AC
  Administered 2023-02-20: 50 mg via ORAL
  Filled 2023-02-20: qty 2

## 2023-02-20 MED ORDER — HEPARIN SOD (PORK) LOCK FLUSH 100 UNIT/ML IV SOLN
500.0000 [IU] | Freq: Once | INTRAVENOUS | Status: AC | PRN
  Administered 2023-02-20: 500 [IU]

## 2023-02-20 MED ORDER — ACETAMINOPHEN 325 MG PO TABS
650.0000 mg | ORAL_TABLET | Freq: Once | ORAL | Status: AC
  Administered 2023-02-20: 650 mg via ORAL
  Filled 2023-02-20: qty 2

## 2023-02-20 MED ORDER — SODIUM CHLORIDE 0.9% FLUSH
10.0000 mL | INTRAVENOUS | Status: DC | PRN
  Administered 2023-02-20: 10 mL

## 2023-02-20 MED ORDER — SODIUM CHLORIDE 0.9% FLUSH
10.0000 mL | Freq: Once | INTRAVENOUS | Status: AC
  Administered 2023-02-20: 10 mL

## 2023-02-20 MED ORDER — SODIUM CHLORIDE 0.9 % IV SOLN
375.0000 mg/m2 | Freq: Once | INTRAVENOUS | Status: AC
  Administered 2023-02-20: 800 mg via INTRAVENOUS
  Filled 2023-02-20: qty 50

## 2023-02-20 MED ORDER — FAMOTIDINE IN NACL 20-0.9 MG/50ML-% IV SOLN
20.0000 mg | Freq: Once | INTRAVENOUS | Status: AC
  Administered 2023-02-20: 20 mg via INTRAVENOUS
  Filled 2023-02-20: qty 50

## 2023-02-20 MED ORDER — PALONOSETRON HCL INJECTION 0.25 MG/5ML
0.2500 mg | Freq: Once | INTRAVENOUS | Status: AC
  Administered 2023-02-20: 0.25 mg via INTRAVENOUS
  Filled 2023-02-20: qty 5

## 2023-02-20 NOTE — Progress Notes (Signed)
Patient seen by Dr. Kale  Vitals are within treatment parameters.  Labs reviewed: and are within treatment parameters.  Per physician team, patient is ready for treatment and there are NO modifications to the treatment plan.  

## 2023-02-20 NOTE — Progress Notes (Signed)
HEMATOLOGY/ONCOLOGY CLINIC VISIT NOTE  Date of Service: 02/20/23  Patient Care Team: Patient, No Pcp Per as PCP - General (General Practice)  CHIEF COMPLAINTS/PURPOSE OF CONSULTATION:  Follow-up for continued evaluation and management of relapsed mantle cell lymphoma  HISTORY OF PRESENTING ILLNESS:  Please see previous note for details of initial presentation  INTERVAL HISTORY  Jerry Tran is here for continued evaluation and management of his relapsed mantle cell lymphoma.   Patient was last seen by me on 01/18/23 and was doing well overall at that time. He did  noted some decreased appetite and mild BLE swelling.  He has since been seen by PA Georga Kaufmann on 01/30/23 and was doing well but noted some fatigue. His appetite had improved. He was experiencing aggravation of his hemorrhoidal pain.  Today, he states that he has been doing well overall. He continues to have hemorrhoidal pain but denies any rectal bleeding. He was having x4-5 episodes of loose stools per day but this has improved in frequency. However, he does continue to have some daily loose stools. He has not been using any antidiarrheal medications. He is eating and trying to drink a few Ensure shakes per week. He has lost 8lbs since 01/18/23. He states that he has noticed improvement of burning hemorrhoid pain since starting the drink the Ensure.  He denies any fevers, chills, night sweats, recent infections, leg swelling, shortness of breath, abdominal pain, groin pain, or testicular swelling.  He would like further evaluation regarding his continued hoarse voice.   MEDICAL HISTORY:  Past Medical History:  Diagnosis Date   Arthritis    Chronic kidney disease, stage 3b (HCC) 12/09/2021   Dyslipidemia    GERD (gastroesophageal reflux disease)    Hypertension    PAF (paroxysmal atrial fibrillation) (HCC)    Prostate cancer (HCC) 03/2021   s/p radiation therapy   PTSD (post-traumatic stress disorder)      SURGICAL HISTORY: Past Surgical History:  Procedure Laterality Date   IR IMAGING GUIDED PORT INSERTION  11/06/2022   LIPOMA EXCISION Right 12/09/2021   Procedure: RIGHT SUPERCLAVICULAR LYMPH NODE EXCISION;  Surgeon: Andria Meuse, MD;  Location: [facility] OR;  Service: General;  Laterality: Right;    SOCIAL HISTORY: Social History   Socioeconomic History   Marital status: Married    Spouse name: Not on file   Number of children: Not on file   Years of education: Not on file   Highest education level: Not on file  Occupational History   Occupation: retired  Tobacco Use   Smoking status: Former    Packs/day: 1.00    Years: 15.00    Additional pack years: 0.00    Total pack years: 15.00    Types: Cigarettes   Smokeless tobacco: Never  Substance and Sexual Activity   Alcohol use: Not Currently    Comment: h/o heavy use   Drug use: Not Currently    Types: Marijuana, Cocaine    Comment: remote   Sexual activity: Not on file  Other Topics Concern   Not on file  Social History Narrative   Not on file   Social Determinants of Health   Financial Resource Strain: Not on file  Food Insecurity: Not on file  Transportation Needs: Not on file  Physical Activity: Not on file  Stress: Not on file  Social Connections: Not on file  Intimate Partner Violence: Not on file    FAMILY HISTORY: Family History  Problem Relation Age of Onset  Cancer Neg Hx     ALLERGIES:  is allergic to sildenafil.  MEDICATIONS:  Current Outpatient Medications  Medication Sig Dispense Refill   acyclovir (ZOVIRAX) 400 MG tablet Take 1 tablet (400 mg total) by mouth daily. 30 tablet 3   allopurinol (ZYLOPRIM) 100 MG tablet Take 1 tablet (100 mg total) by mouth 2 (two) times daily. 60 tablet 0   ammonium lactate (LAC-HYDRIN) 12 % lotion Apply 1 application topically 2 (two) times daily as needed for dry skin.     atorvastatin (LIPITOR) 20 MG tablet Take 10 mg by mouth at bedtime.      Cholecalciferol (VITAMIN D3 PO) Take 1 tablet by mouth every morning.     diclofenac Sodium (VOLTAREN) 1 % GEL Apply 2 g topically 4 (four) times daily as needed (pain).     feeding supplement (ENSURE ENLIVE / ENSURE PLUS) LIQD Take 237 mLs by mouth 2 (two) times daily between meals. 237 mL 12   flecainide (TAMBOCOR) 100 MG tablet Take 100 mg by mouth every 12 (twelve) hours.     fluticasone (FLONASE) 50 MCG/ACT nasal spray Place 1 spray into both nostrils daily as needed for allergies or rhinitis.     furosemide (LASIX) 20 MG tablet Take 20 mg by mouth.     hydrocortisone (ANUSOL-HC) 25 MG suppository Place 1 suppository (25 mg total) rectally 2 (two) times daily. 12 suppository 0   hydroxypropyl methylcellulose / hypromellose (ISOPTO TEARS / GONIOVISC) 2.5 % ophthalmic solution Place 2 drops into both eyes 2 (two) times daily as needed for dry eyes.     lidocaine-prilocaine (EMLA) cream Apply to affected area once 30 g 3   LORazepam (ATIVAN) 0.5 MG tablet Take 1 tablet (0.5 mg total) by mouth every 6 (six) hours as needed (Nausea or vomiting). 30 tablet 0   metoprolol tartrate (LOPRESSOR) 25 MG tablet Take 12.5 mg by mouth 2 (two) times daily. Take with flecainide     mirtazapine (REMERON) 30 MG tablet Take 30 mg by mouth at bedtime.     omeprazole (PRILOSEC) 20 MG capsule Take 20 mg by mouth daily before breakfast.     ondansetron (ZOFRAN) 8 MG tablet Take 1 tablet (8 mg total) by mouth every 8 (eight) hours as needed for nausea or vomiting. Start on the third day after cyclophosphamide chemotherapy. 30 tablet 1   potassium chloride SA (KLOR-CON M) 20 MEQ tablet Take 1 tablet (20 mEq total) by mouth 2 (two) times daily. 60 tablet 0   prazosin (MINIPRESS) 2 MG capsule Take 6 mg by mouth at bedtime.     predniSONE (DELTASONE) 20 MG tablet Take 3 tablets (60 mg total) by mouth daily. Take with food on days 1-5 of chemotherapy. 25 tablet 5   prochlorperazine (COMPAZINE) 10 MG tablet Take 1 tablet (10  mg total) by mouth every 6 (six) hours as needed for nausea or vomiting. 30 tablet 6   terbinafine (LAMISIL) 1 % cream Apply 1 application topically 2 (two) times daily as needed (athlete's foot).     torsemide (DEMADEX) 10 MG tablet Take 20 mg by mouth daily.     triamcinolone cream (KENALOG) 0.1 % Apply 1 application topically 2 (two) times daily as needed (rash).     No current facility-administered medications for this visit.    REVIEW OF SYSTEMS:    10 Point review of Systems was done is negative except as noted above.   PHYSICAL EXAMINATION: .BP 111/70   Pulse 69   Temp  97.8 F (36.6 C)   Resp 20   Wt 180 lb 12.8 oz (82 kg)   SpO2 100%   BMI 22.01 kg/m  GENERAL:alert, in no acute distress and comfortable SKIN: no acute rashes, no significant lesions EYES: conjunctiva are pink and non-injected, sclera anicteric OROPHARYNX: MMM, no exudates, no oropharyngeal erythema or ulceration NECK: supple, no JVD LYMPH:  no palpable lymphadenopathy in the cervical, axillary or inguinal regions LUNGS: clear to auscultation b/l with normal respiratory effort HEART: regular rate & rhythm ABDOMEN:  normoactive bowel sounds , non tender, not distended. Extremity: no pedal edema PSYCH: alert & oriented x 3 with fluent speech NEURO: no focal motor/sensory deficits    LABORATORY DATA:  I have reviewed the data as listed     Latest Ref Rng & Units 02/20/2023   10:32 AM 01/30/2023   10:20 AM 01/18/2023    8:57 AM  CBC  WBC 4.0 - 10.5 K/uL 6.0  3.1  4.3   Hemoglobin 13.0 - 17.0 g/dL 9.6  7.7  7.8   Hematocrit 39.0 - 52.0 % 29.7  23.9  23.8   Platelets 150 - 400 K/uL 194  258  166       Latest Ref Rng & Units 02/20/2023   10:32 AM 01/30/2023   10:20 AM 01/18/2023    8:57 AM  CMP  Glucose 70 - 99 mg/dL 161  096  045   BUN 8 - 23 mg/dL 28  18  24    Creatinine 0.61 - 1.24 mg/dL 4.09  8.11  9.14   Sodium 135 - 145 mmol/L 139  142  140   Potassium 3.5 - 5.1 mmol/L 4.1  3.3  2.8   Chloride  98 - 111 mmol/L 107  107  102   CO2 22 - 32 mmol/L 26  30  29    Calcium 8.9 - 10.3 mg/dL 9.7  9.5  9.2   Total Protein 6.5 - 8.1 g/dL 6.9  6.4  6.2   Total Bilirubin 0.3 - 1.2 mg/dL 0.4  0.3  0.3   Alkaline Phos 38 - 126 U/L 61  59  66   AST 15 - 41 U/L 15  12  16    ALT 0 - 44 U/L 10  7  10     Lab Results  Component Value Date   LDH 151 05/25/2022    Surgical Pathology 10/16/2022: A. 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    RADIOGRAPHIC STUDIES: I have personally reviewed the radiological images as listed and agreed with the findings in the report.  No results found.   ASSESSMENT & PLAN:   77 y.o.  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  3) moderate to severe protein calorie malnutrition-He has  lost 8lbs but is drinking x2-3 Ensure drinks per week.  Wt Readings from Last 3 Encounters:  02/20/23 180 lb 12.8 oz (82 kg)  01/18/23 188 lb 11.2 oz (85.6 kg)  01/09/23 192 lb 6.4 oz (87.3 kg)   4) Loose stools- He is having a few small, loose stools per day.  5) BLE swelling- resolved with compression socks.  6) Hemorrhoids-  He reports pain with his hemorrhoid flare but denies any rectal bleeding. He has not seen GI or surgeon for this problem, but [facility]   7) Hoarse voice- He reports more difficulty projecting his voice. Denies any noticeable reflux but he does take acid suppression medication. He denies any significant seasonal allergies.  PLAN: -Discussed lab results from today, 02/20/23, in detail with patient and his wife. CBC showed WBC of 6.0K, hemoglobin improved to 9.6, and platelets improved to 194K. CMP WNL. -He is tolerating R-CHOP overall without significant toxicities. He will receive cycle 7 day 1 today. -Repeat PET scan x1 month after completing R-CHOP therapy. -Continue tuck's pads and start sitz baths as needed for hemorrhoids. -Recommended patient to consume adequate amounts of food and water to improve energy levels. -I advised him to increase his Ensure use to x1-2 drinks per day. -I encouraged him to increase his fiber intake given his loose stools. He was advised to use Imodium on days when his BM are more bothersome or when he plans to go out. -Follow up with GI as referred by [facility] provider. He sees his [place] provider in the next week. -Will refer to ENT for further evaluation of his hoarse voice. -will plan to start Ibrutinib + Rituxan maintainence  FOLLOW-UP: Pet/ct in 3 weeks Starting Maintenance Rituxan in 4 weeks with portflush, labs and MD visit   The total time spent in the appointment was 35 minutes* .  All of the patient's questions were answered with apparent satisfaction. The patient knows to call the clinic with any problems, questions or concerns.  Wyvonnia Lora MD MS AAHIVMS [facility] Hematology/Oncology Physician [facility]  .*Total Encounter Time as defined by the Centers for Medicare and Medicaid Services includes, in addition to the face-to-face time of a patient visit (documented in the note above) non-face-to-face time:  obtaining and reviewing outside history, ordering and reviewing medications, tests or procedures, care coordination (communications with other health care professionals or caregivers) and documentation in the medical record.   I,Alexis Herring,acting as a Neurosurgeon for Wyvonnia Lora, MD.,have documented all relevant documentation on the behalf of Wyvonnia Lora, MD,as directed by  Wyvonnia Lora, MD while in the presence of Wyvonnia Lora, MD.  .I have reviewed the above documentation for accuracy and completeness, and I agree with the above. [name] MD

## 2023-02-20 NOTE — Patient Instructions (Addendum)
[facility]  Discharge Instructions: Thank you for choosing [facility] to provide your oncology and hematology care.   If you have a lab appointment with the Cancer Center, please go directly to the Cancer Center and check in at the registration area.   Wear comfortable clothing and clothing appropriate for easy access to any Portacath or PICC line.   We strive to give you quality time with your provider. You may need to reschedule your appointment if you arrive late (15 or more minutes).  Arriving late affects you and other patients whose appointments are after yours.  Also, if you miss three or more appointments without notifying the office, you may be dismissed from the clinic at the provider's discretion.      For prescription refill requests, have your pharmacy contact our office and allow 72 hours for refills to be completed.    Today you received the following chemotherapy and/or immunotherapy agents: Rituximab, Cytoxan, Adriamycin, Vincristine      To help prevent nausea and vomiting after your treatment, we encourage you to take your nausea medication as directed.  BELOW ARE SYMPTOMS THAT SHOULD BE REPORTED IMMEDIATELY: *FEVER GREATER THAN 100.4 F (38 C) OR HIGHER *CHILLS OR SWEATING *NAUSEA AND VOMITING THAT IS NOT CONTROLLED WITH YOUR NAUSEA MEDICATION *UNUSUAL SHORTNESS OF BREATH *UNUSUAL BRUISING OR BLEEDING *URINARY PROBLEMS (pain or burning when urinating, or frequent urination) *BOWEL PROBLEMS (unusual diarrhea, constipation, pain near the anus) TENDERNESS IN MOUTH AND THROAT WITH OR WITHOUT PRESENCE OF ULCERS (sore throat, sores in mouth, or a toothache) UNUSUAL RASH, SWELLING OR PAIN  UNUSUAL VAGINAL DISCHARGE OR ITCHING   Items with * indicate a potential emergency and should be followed up as soon as possible or go to the Emergency Department if any problems should occur.  Please show the CHEMOTHERAPY ALERT CARD or  IMMUNOTHERAPY ALERT CARD at check-in to the Emergency Department and triage nurse.  Should you have questions after your visit or need to cancel or reschedule your appointment, please contact [facility]  Dept: [phone]  and follow the prompts.  Office hours are 8:00 a.m. to 4:30 p.m. Monday - Friday. Please note that voicemails left after 4:00 p.m. may not be returned until the following business day.  We are closed weekends and major holidays. You have access to a nurse at all times for urgent questions. Please call the main number to the clinic Dept: [phone] and follow the prompts.   For any non-urgent questions, you may also contact your provider using MyChart. We now offer e-Visits for anyone 19 and older to request care online for non-urgent symptoms. For details visit [email or web address].   Also download the MyChart app! Go to the app store, search "MyChart", open the app, select [facility], and log in with your MyChart username and password.

## 2023-02-22 ENCOUNTER — Other Ambulatory Visit: Payer: Self-pay

## 2023-02-22 ENCOUNTER — Inpatient Hospital Stay: Payer: Medicare Other

## 2023-02-22 VITALS — BP 115/76 | HR 67 | Temp 98.4°F | Resp 18

## 2023-02-22 DIAGNOSIS — Z5112 Encounter for antineoplastic immunotherapy: Secondary | ICD-10-CM | POA: Diagnosis not present

## 2023-02-22 DIAGNOSIS — Z7189 Other specified counseling: Secondary | ICD-10-CM

## 2023-02-22 DIAGNOSIS — C8318 Mantle cell lymphoma, lymph nodes of multiple sites: Secondary | ICD-10-CM

## 2023-02-22 MED ORDER — PEGFILGRASTIM-CBQV 6 MG/0.6ML ~~LOC~~ SOSY
6.0000 mg | PREFILLED_SYRINGE | Freq: Once | SUBCUTANEOUS | Status: AC
  Administered 2023-02-22: 6 mg via SUBCUTANEOUS
  Filled 2023-02-22: qty 0.6

## 2023-02-22 NOTE — Patient Instructions (Signed)

## 2023-02-23 ENCOUNTER — Other Ambulatory Visit: Payer: Self-pay

## 2023-02-26 ENCOUNTER — Encounter: Payer: Self-pay | Admitting: Hematology

## 2023-02-26 MED ORDER — IBRUTINIB 140 MG PO TABS
420.0000 mg | ORAL_TABLET | Freq: Every day | ORAL | 0 refills | Status: DC
  Filled 2023-02-26: qty 90, 30d supply, fill #0

## 2023-02-27 ENCOUNTER — Telehealth: Payer: Self-pay | Admitting: Pharmacy Technician

## 2023-02-27 ENCOUNTER — Other Ambulatory Visit (HOSPITAL_COMMUNITY): Payer: Self-pay

## 2023-02-27 ENCOUNTER — Telehealth: Payer: Self-pay | Admitting: Pharmacist

## 2023-02-27 ENCOUNTER — Encounter: Payer: Self-pay | Admitting: Hematology

## 2023-02-27 ENCOUNTER — Other Ambulatory Visit: Payer: Self-pay

## 2023-02-27 DIAGNOSIS — C8318 Mantle cell lymphoma, lymph nodes of multiple sites: Secondary | ICD-10-CM

## 2023-02-27 MED ORDER — IBRUTINIB 420 MG PO TABS
420.0000 mg | ORAL_TABLET | Freq: Every day | ORAL | 0 refills | Status: DC
Start: 2023-02-27 — End: 2023-04-12
  Filled 2023-02-27 – 2023-03-23 (×2): qty 28, 28d supply, fill #0

## 2023-02-27 NOTE — Telephone Encounter (Signed)
Oral Oncology Patient Advocate Encounter   Was successful in securing patient an $3,250 grant from [employer] [facility]) to provide copayment coverage for Imbruvica.  This will keep the out of pocket expense at $0.     I have spoken with the patient.    The billing information is as follows and has been shared with [facility].   Member ID: [id number] Group ID: [id number] RxBin: [id number] Dates of Eligibility: 02/27/23 through 02/25/24  Fund:  [employer]  Jinger Neighbors, CPhT-Adv Oncology Pharmacy Patient Advocate [facility] Direct Number: [phone]  Fax: [phone]

## 2023-02-27 NOTE — Telephone Encounter (Signed)
Oral Oncology Patient Advocate Encounter  Prior Authorization for Jerry Tran has been approved.    PA#  [id number] Effective dates: 02/27/23 through 10/30/23  Patients co-pay is $3,326.53.    Jinger Neighbors, CPhT-Adv Oncology Pharmacy Patient Advocate [facility] Direct Number: [phone]  Fax: [phone]

## 2023-02-27 NOTE — Telephone Encounter (Addendum)
Oral Oncology Pharmacist Encounter  Received new prescription for Imbruvica (ibrutinib) for the treatment of mantle cell lymphoma in conjunction with rituximab, planned duration until disease progression or unacceptable drug toxicity.  CMP and CBC w/ Diff from 02/20/23 assessed, patient with Scr of 1.21 mg/dL (CrCl ~40.9 mL/min) - no baseline renal dose adjustments required. Patient's last cycle of R-CHOP was received 02/20/23 - per MD note patient not planned to start next line of therapy for ~4 weeks. Per Dr. Candise Che, patient will be started on 420 mg/d and escalated to 560 mg/d dose if tolerated. Prescription dose and frequency assessed for appropriateness.  Current medication list in Epic reviewed, no relevant/significant DDIs with Imbruvica identified.  Evaluated chart and no patient barriers to medication adherence noted.   Patient agreement for treatment documented in MD note on 02/20/23.  Prescription has been e-scribed to the [facility] for benefits analysis and approval.  Oral Oncology Clinic will continue to follow for insurance authorization, copayment issues, initial counseling and start date.  Lenord Carbo, PharmD, BCPS, [facility] Hematology/Oncology Clinical Pharmacist [facility] Oral Chemotherapy Navigation Clinics [phone] 02/27/2023 8:48 AM

## 2023-02-27 NOTE — Telephone Encounter (Signed)
Oral Oncology Patient Advocate Encounter   Received notification that prior authorization for Imbruvica is required.   PA submitted on 02/27/23 Key [id number] Status is pending     Jinger Neighbors, CPhT-Adv Oncology Pharmacy Patient Advocate [facility] Direct Number: [phone]  Fax: [phone]

## 2023-02-27 NOTE — Telephone Encounter (Signed)
Oral Oncology Patient Advocate Encounter  Attempted to contact patient to confirm estimated income for grant application with the [employer]. Left vm.  Jinger Neighbors, CPhT-Adv Oncology Pharmacy Patient Advocate [facility] Direct Number: [phone]  Fax: [phone]

## 2023-03-01 ENCOUNTER — Other Ambulatory Visit: Payer: Self-pay | Admitting: Hematology

## 2023-03-02 ENCOUNTER — Other Ambulatory Visit: Payer: Self-pay

## 2023-03-14 ENCOUNTER — Other Ambulatory Visit (HOSPITAL_COMMUNITY): Payer: Self-pay

## 2023-03-15 ENCOUNTER — Telehealth: Payer: Self-pay | Admitting: Pharmacy Technician

## 2023-03-15 ENCOUNTER — Other Ambulatory Visit (HOSPITAL_COMMUNITY): Payer: Self-pay

## 2023-03-15 ENCOUNTER — Encounter: Payer: Self-pay | Admitting: Hematology

## 2023-03-15 NOTE — Telephone Encounter (Addendum)
Oral Oncology Patient Advocate Encounter  Was successful in securing patient a $8,500 grant from [employer] to provide copayment coverage for Imbruvica.  This will keep the out of pocket expense at $0.     [employer] ID: [id number]   The billing information is as follows and has been shared with [facility].    RxBin: [id number] PCN: [id number] Member ID: [id number] Group ID: [id number] Dates of Eligibility: 02/13/23 through 02/12/24  Fund:  [employer]  Jinger Neighbors, CPhT-Adv Oncology Pharmacy Patient Advocate [facility] Direct Number: [phone]  Fax: [phone]

## 2023-03-16 ENCOUNTER — Encounter: Payer: Self-pay | Admitting: Hematology

## 2023-03-16 ENCOUNTER — Encounter (HOSPITAL_COMMUNITY)
Admission: RE | Admit: 2023-03-16 | Discharge: 2023-03-16 | Disposition: A | Discharge status: Home / Self Care | Payer: Medicare Other | Source: Ambulatory Visit | Attending: Hematology | Admitting: Hematology

## 2023-03-16 ENCOUNTER — Other Ambulatory Visit (HOSPITAL_COMMUNITY): Payer: Self-pay

## 2023-03-16 DIAGNOSIS — C8318 Mantle cell lymphoma, lymph nodes of multiple sites: Secondary | ICD-10-CM | POA: Insufficient documentation

## 2023-03-16 LAB — GLUCOSE, CAPILLARY: Glucose-Capillary: 89 mg/dL (ref 70–99)

## 2023-03-16 MED ORDER — HEPARIN SOD (PORK) LOCK FLUSH 100 UNIT/ML IV SOLN
500.0000 [IU] | Freq: Once | INTRAVENOUS | Status: AC
  Administered 2023-03-16: 500 [IU] via INTRAVENOUS
  Filled 2023-03-16: qty 5

## 2023-03-16 MED ORDER — FLUDEOXYGLUCOSE F - 18 (FDG) INJECTION
9.0200 | Freq: Once | INTRAVENOUS | Status: AC
  Administered 2023-03-16: 9.02 via INTRAVENOUS

## 2023-03-20 ENCOUNTER — Other Ambulatory Visit: Payer: Self-pay | Admitting: Hematology

## 2023-03-20 DIAGNOSIS — Z7189 Other specified counseling: Secondary | ICD-10-CM

## 2023-03-20 DIAGNOSIS — C8318 Mantle cell lymphoma, lymph nodes of multiple sites: Secondary | ICD-10-CM

## 2023-03-22 ENCOUNTER — Other Ambulatory Visit (HOSPITAL_COMMUNITY): Payer: Self-pay

## 2023-03-22 ENCOUNTER — Other Ambulatory Visit: Payer: Self-pay | Admitting: Hematology

## 2023-03-23 ENCOUNTER — Other Ambulatory Visit: Payer: Self-pay

## 2023-03-23 ENCOUNTER — Inpatient Hospital Stay: Payer: Medicare Other | Attending: Hematology and Oncology | Admitting: Hematology

## 2023-03-23 ENCOUNTER — Telehealth: Payer: Self-pay | Admitting: Radiation Oncology

## 2023-03-23 ENCOUNTER — Inpatient Hospital Stay: Payer: Medicare Other

## 2023-03-23 ENCOUNTER — Other Ambulatory Visit (HOSPITAL_COMMUNITY): Payer: Self-pay

## 2023-03-23 VITALS — BP 104/69 | HR 73 | Temp 97.9°F | Resp 17 | Ht 76.0 in | Wt 183.1 lb

## 2023-03-23 DIAGNOSIS — Z7189 Other specified counseling: Secondary | ICD-10-CM

## 2023-03-23 DIAGNOSIS — C8318 Mantle cell lymphoma, lymph nodes of multiple sites: Secondary | ICD-10-CM

## 2023-03-23 DIAGNOSIS — D696 Thrombocytopenia, unspecified: Secondary | ICD-10-CM | POA: Diagnosis not present

## 2023-03-23 DIAGNOSIS — D649 Anemia, unspecified: Secondary | ICD-10-CM | POA: Diagnosis not present

## 2023-03-23 LAB — CBC WITH DIFFERENTIAL (CANCER CENTER ONLY)
Abs Immature Granulocytes: 0.08 10*3/uL — ABNORMAL HIGH (ref 0.00–0.07)
Basophils Absolute: 0 10*3/uL (ref 0.0–0.1)
Basophils Relative: 0 %
Eosinophils Absolute: 0.1 10*3/uL (ref 0.0–0.5)
Eosinophils Relative: 1 %
HCT: 31.2 % — ABNORMAL LOW (ref 39.0–52.0)
Hemoglobin: 9.8 g/dL — ABNORMAL LOW (ref 13.0–17.0)
Immature Granulocytes: 2 %
Lymphocytes Relative: 9 %
Lymphs Abs: 0.5 10*3/uL — ABNORMAL LOW (ref 0.7–4.0)
MCH: 27.6 pg (ref 26.0–34.0)
MCHC: 31.4 g/dL (ref 30.0–36.0)
MCV: 87.9 fL (ref 80.0–100.0)
Monocytes Absolute: 0.5 10*3/uL (ref 0.1–1.0)
Monocytes Relative: 8 %
Neutro Abs: 4.3 10*3/uL (ref 1.7–7.7)
Neutrophils Relative %: 80 %
Platelet Count: 197 10*3/uL (ref 150–400)
RBC: 3.55 MIL/uL — ABNORMAL LOW (ref 4.22–5.81)
RDW: 15.6 % — ABNORMAL HIGH (ref 11.5–15.5)
WBC Count: 5.3 10*3/uL (ref 4.0–10.5)
nRBC: 0 % (ref 0.0–0.2)

## 2023-03-23 LAB — CMP (CANCER CENTER ONLY)
ALT: 7 U/L (ref 0–44)
AST: 14 U/L — ABNORMAL LOW (ref 15–41)
Albumin: 3.9 g/dL (ref 3.5–5.0)
Alkaline Phosphatase: 66 U/L (ref 38–126)
Anion gap: 5 (ref 5–15)
BUN: 28 mg/dL — ABNORMAL HIGH (ref 8–23)
CO2: 29 mmol/L (ref 22–32)
Calcium: 9.6 mg/dL (ref 8.9–10.3)
Chloride: 107 mmol/L (ref 98–111)
Creatinine: 1.3 mg/dL — ABNORMAL HIGH (ref 0.61–1.24)
GFR, Estimated: 57 mL/min — ABNORMAL LOW (ref >60)
Glucose, Bld: 85 mg/dL (ref 70–99)
Potassium: 4.4 mmol/L (ref 3.5–5.1)
Sodium: 141 mmol/L (ref 135–145)
Total Bilirubin: 0.3 mg/dL (ref 0.3–1.2)
Total Protein: 6.9 g/dL (ref 6.5–8.1)

## 2023-03-23 LAB — LACTATE DEHYDROGENASE: LDH: 196 U/L — ABNORMAL HIGH (ref 98–192)

## 2023-03-23 MED ORDER — PREDNISONE 20 MG PO TABS
60.0000 mg | ORAL_TABLET | Freq: Every day | ORAL | 0 refills | Status: DC
Start: 2023-03-23 — End: 2023-03-29
  Filled 2023-03-23: qty 30, 10d supply, fill #0

## 2023-03-23 MED ORDER — NYSTATIN 100000 UNIT/ML MT SUSP
5.0000 mL | Freq: Four times a day (QID) | OROMUCOSAL | 2 refills | Status: DC
  Filled 2023-03-23: qty 200, 10d supply, fill #0

## 2023-03-23 NOTE — Progress Notes (Signed)
HEMATOLOGY/ONCOLOGY CLINIC VISIT NOTE  Date of Service: 03/23/23  Patient Care Team: Patient, No Pcp Per as PCP - General (General Practice)  CHIEF COMPLAINTS/PURPOSE OF CONSULTATION:  Follow-up for continued evaluation and management of relapsed mantle cell lymphoma  HISTORY OF PRESENTING ILLNESS:  Please see previous note for details of initial presentation  INTERVAL HISTORY  [name] is here for continued evaluation and management of his relapsed mantle cell lymphoma.   Patient was last seen by me on 02/20/2023 and complained of continued hemorrhoidal pain, daily loose stools, and hoarse voice.  Today, he is accompanied by his wife. He reports that he has not yet started taking Ibrutinib. Patient has been taking Clotrimazole since 03/20/2023 to manage thrush symptoms. Patient's sense of throat fullness and swallowing issues has worsened and did cause him to present to the emergency room on 03/20/2023. His wife has noticed that patient has experienced choking with food consumption. He does take Clotrimazole for thrush symptoms. Patient continues to have difficulty breathing through his nose and his voice continues to be nasally.  MEDICAL HISTORY:  Past Medical History:  Diagnosis Date   Arthritis    Chronic kidney disease, stage 3b (HCC) 12/09/2021   Dyslipidemia    GERD (gastroesophageal reflux disease)    Hypertension    PAF (paroxysmal atrial fibrillation) (HCC)    Prostate cancer (HCC) 03/2021   s/p radiation therapy   PTSD (post-traumatic stress disorder)     SURGICAL HISTORY: Past Surgical History:  Procedure Laterality Date   IR IMAGING GUIDED PORT INSERTION  11/06/2022   LIPOMA EXCISION Right 12/09/2021   Procedure: RIGHT SUPERCLAVICULAR LYMPH NODE EXCISION;  Surgeon: Andria Meuse, MD;  Location: [facility] OR;  Service: General;  Laterality: Right;    SOCIAL HISTORY: Social History   Socioeconomic History   Marital status: Married    Spouse name: Not on  file   Number of children: Not on file   Years of education: Not on file   Highest education level: Not on file  Occupational History   Occupation: retired  Tobacco Use   Smoking status: Former    Packs/day: 1.00    Years: 15.00    Additional pack years: 0.00    Total pack years: 15.00    Types: Cigarettes   Smokeless tobacco: Never  Substance and Sexual Activity   Alcohol use: Not Currently    Comment: h/o heavy use   Drug use: Not Currently    Types: Marijuana, Cocaine    Comment: remote   Sexual activity: Not on file  Other Topics Concern   Not on file  Social History Narrative   Not on file   Social Determinants of Health   Financial Resource Strain: Not on file  Food Insecurity: Not on file  Transportation Needs: Not on file  Physical Activity: Not on file  Stress: Not on file  Social Connections: Not on file  Intimate Partner Violence: Not on file    FAMILY HISTORY: Family History  Problem Relation Age of Onset   Cancer Neg Hx     ALLERGIES:  is allergic to sildenafil.  MEDICATIONS:  Current Outpatient Medications  Medication Sig Dispense Refill   acyclovir (ZOVIRAX) 400 MG tablet TAKE 1 TABLET(400 MG) BY MOUTH DAILY 30 tablet 3   allopurinol (ZYLOPRIM) 100 MG tablet Take 1 tablet (100 mg total) by mouth 2 (two) times daily. 60 tablet 0   ammonium lactate (LAC-HYDRIN) 12 % lotion Apply 1 application topically 2 (two) times  daily as needed for dry skin.     atorvastatin (LIPITOR) 20 MG tablet Take 10 mg by mouth at bedtime.     Cholecalciferol (VITAMIN D3 PO) Take 1 tablet by mouth every morning.     diclofenac Sodium (VOLTAREN) 1 % GEL Apply 2 g topically 4 (four) times daily as needed (pain).     feeding supplement (ENSURE ENLIVE / ENSURE PLUS) LIQD Take 237 mLs by mouth 2 (two) times daily between meals. 237 mL 12   flecainide (TAMBOCOR) 100 MG tablet Take 100 mg by mouth every 12 (twelve) hours.     fluticasone (FLONASE) 50 MCG/ACT nasal spray Place 1  spray into both nostrils daily as needed for allergies or rhinitis.     furosemide (LASIX) 20 MG tablet Take 20 mg by mouth.     hydrocortisone (ANUSOL-HC) 25 MG suppository Place 1 suppository (25 mg total) rectally 2 (two) times daily. 12 suppository 0   hydroxypropyl methylcellulose / hypromellose (ISOPTO TEARS / GONIOVISC) 2.5 % ophthalmic solution Place 2 drops into both eyes 2 (two) times daily as needed for dry eyes.     ibrutinib (IMBRUVICA) 420 MG tablet Take 1 tablet (420 mg total) by mouth daily. Take with a glass of water. 28 tablet 0   lidocaine-prilocaine (EMLA) cream Apply to affected area once 30 g 3   LORazepam (ATIVAN) 0.5 MG tablet Take 1 tablet (0.5 mg total) by mouth every 6 (six) hours as needed (Nausea or vomiting). 30 tablet 0   metoprolol tartrate (LOPRESSOR) 25 MG tablet Take 12.5 mg by mouth 2 (two) times daily. Take with flecainide     mirtazapine (REMERON) 30 MG tablet Take 30 mg by mouth at bedtime.     omeprazole (PRILOSEC) 20 MG capsule Take 20 mg by mouth daily before breakfast.     ondansetron (ZOFRAN) 8 MG tablet Take 1 tablet (8 mg total) by mouth every 8 (eight) hours as needed for nausea or vomiting. Start on the third day after cyclophosphamide chemotherapy. 30 tablet 1   potassium chloride SA (KLOR-CON M) 20 MEQ tablet TAKE 1 TABLET(20 MEQ) BY MOUTH TWICE DAILY 180 tablet 2   prazosin (MINIPRESS) 2 MG capsule Take 6 mg by mouth at bedtime.     predniSONE (DELTASONE) 20 MG tablet Take 3 tablets (60 mg total) by mouth daily. Take with food on days 1-5 of chemotherapy. 25 tablet 5   prochlorperazine (COMPAZINE) 10 MG tablet Take 1 tablet (10 mg total) by mouth every 6 (six) hours as needed for nausea or vomiting. 30 tablet 6   terbinafine (LAMISIL) 1 % cream Apply 1 application topically 2 (two) times daily as needed (athlete's foot).     torsemide (DEMADEX) 10 MG tablet Take 20 mg by mouth daily.     triamcinolone cream (KENALOG) 0.1 % Apply 1 application  topically 2 (two) times daily as needed (rash).     No current facility-administered medications for this visit.    REVIEW OF SYSTEMS:    10 Point review of Systems was done is negative except as noted above.   PHYSICAL EXAMINATION: .BP 104/69 (BP Location: Left Arm, Patient Position: Sitting)   Pulse 73   Temp 97.9 F (36.6 C) (Temporal)   Resp 17   Ht 6\' 4"  (1.93 m)   Wt 183 lb 1.6 oz (83.1 kg)   SpO2 100%   BMI 22.29 kg/m   GENERAL:alert, in no acute distress and comfortable SKIN: no acute rashes, no significant lesions EYES: conjunctiva  are pink and non-injected, sclera anicteric OROPHARYNX: MMM, no exudates, no oropharyngeal erythema or ulceration NECK: supple, no JVD LYMPH:  no palpable lymphadenopathy in the cervical, axillary or inguinal regions LUNGS: clear to auscultation b/l with normal respiratory effort HEART: regular rate & rhythm ABDOMEN:  normoactive bowel sounds , non tender, not distended. Extremity: no pedal edema PSYCH: alert & oriented x 3 with fluent speech NEURO: no focal motor/sensory deficits   LABORATORY DATA:  I have reviewed the data as listed     Latest Ref Rng & Units 02/20/2023   10:32 AM 01/30/2023   10:20 AM 01/18/2023    8:57 AM  CBC  WBC 4.0 - 10.5 K/uL 6.0  3.1  4.3   Hemoglobin 13.0 - 17.0 g/dL 9.6  7.7  7.8   Hematocrit 39.0 - 52.0 % 29.7  23.9  23.8   Platelets 150 - 400 K/uL 194  258  166       Latest Ref Rng & Units 02/20/2023   10:32 AM 01/30/2023   10:20 AM 01/18/2023    8:57 AM  CMP  Glucose 70 - 99 mg/dL 161  096  045   BUN 8 - 23 mg/dL 28  18  24    Creatinine 0.61 - 1.24 mg/dL 4.09  8.11  9.14   Sodium 135 - 145 mmol/L 139  142  140   Potassium 3.5 - 5.1 mmol/L 4.1  3.3  2.8   Chloride 98 - 111 mmol/L 107  107  102   CO2 22 - 32 mmol/L 26  30  29    Calcium 8.9 - 10.3 mg/dL 9.7  9.5  9.2   Total Protein 6.5 - 8.1 g/dL 6.9  6.4  6.2   Total Bilirubin 0.3 - 1.2 mg/dL 0.4  0.3  0.3   Alkaline Phos 38 - 126 U/L 61  59   66   AST 15 - 41 U/L 15  12  16    ALT 0 - 44 U/L 10  7  10     Lab Results  Component Value Date   LDH 151 05/25/2022    Surgical Pathology 10/16/2022: A. 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    RADIOGRAPHIC STUDIES: I have personally reviewed the radiological images as listed and agreed with the findings in the report.  NM PET Image Restag (PS) Skull Base To Thigh  Result Date: 03/21/2023 CLINICAL DATA:  Subsequent treatment strategy for mantle cell lymphoma. EXAM: NUCLEAR MEDICINE PET SKULL BASE TO THIGH TECHNIQUE: 9.0 mCi F-18 FDG was injected intravenously. Full-ring PET imaging was performed from the skull base to thigh after the radiotracer. CT data was obtained and used for attenuation correction and anatomic localization. Fasting blood glucose: 89 mg/dl COMPARISON:  78/29/5621. FINDINGS: Mediastinal blood pool activity: SUV max 2.0 Liver activity: SUV max 3.0 NECK: Nasopharyngeal mass has enlarged, measuring 2.9 x 3.9 cm (4/14), SUV max 11.1, previously 1.9 x 3.0 cm and SUV max 8.5. Masslike tonsillar enlargement, also increased from prior but difficult to measure. For reference, right paracentral component measures 2.5 x 3.3 cm (4/21), SUV max 8.9, previously 0.9 x 2.3 cm and SUV max 6.7. Hypermetabolic level  II lymph nodes with index lymph node on the right measuring 10 mm (4/22), SUV max 7.5, compared to 6 mm and SUV max 2.9 previously. Incidental CT findings: None. CHEST: No abnormal hypermetabolism. Incidental CT findings: Right IJ Port-A-Cath terminates in the right atrium. Atherosclerotic calcification  of the aorta. Heart is enlarged. Small amount of pericardial fluid is unchanged and may be physiologic. ABDOMEN/PELVIS: Hypermetabolic inguinal lymph nodes extend to the upper medial thighs. Index left inguinal lymph node measures 1.6 cm (4/77), SUV max 5.4, previously 1.4 cm and SUV max 1.8. No additional abnormal hypermetabolism. Incidental CT findings: Probable cysts in the liver. Liver, gallbladder, adrenal glands, kidneys, spleen, pancreas, stomach and bowel are otherwise grossly unremarkable. SKELETON: No abnormal hypermetabolism. Incidental CT findings: Degenerative changes in the spine. IMPRESSION: 1. Interval progression of lymphoma as evidenced by enlarging and increasingly hypermetabolic nasopharyngeal and bilateral tonsillar masses as well as inguinal adenopathy. 2.  Aortic atherosclerosis (ICD10-I70.0). Electronically Signed   By: Leanna Battles M.D.   On: 03/21/2023 09:56     ASSESSMENT & PLAN:   77 y.o.  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  3) moderate to severe protein calorie malnutrition-He has lost 8lbs but is drinking x2-3 Ensure drinks per week.  Wt Readings from Last 3 Encounters:  03/23/23 183 lb 1.6 oz (83.1 kg)  02/20/23 180 lb 12.8 oz (82 kg)  01/18/23 188 lb 11.2 oz (85.6 kg)   4) Loose stools- He is having a few small, loose stools per day.  5) BLE swelling- resolved with compression socks.  6) Hemorrhoids- He reports pain with his hemorrhoid flare but denies any rectal bleeding. He has  not seen GI or surgeon for this problem, but [facility]   7) Hoarse voice- He reports more difficulty projecting his voice. Denies any noticeable reflux but he does take acid suppression medication. He denies any significant seasonal allergies.  PLAN:  -Discussed results of 03/16/2023 PET scan which showed marginal changes: 1. Interval progression of lymphoma as evidenced by enlarging and increasingly hypermetabolic nasopharyngeal and bilateral tonsillar masses as well as inguinal adenopathy. 2.  Aortic atherosclerosis 3. Index left inguinal lymph node measures 1.6 cm, previously 1.4 cm -Start Ibutanib today. Will start at a low dose and patient will eventually be on full dose in one month -Discussed that Ibutanib works by blocking one of the chemical pathways in lymphoma cells that make them grow faster. -advised patient to watch for any potential side effects such as abnormal bleeding/bruising or abnormal heart rhythms -Will add Gazyva antibody treatment as an additional medication from a maintenance treatment standpoint -will connect patient to [facility] for an additional opinion and to evaluative for any additional treatment needed down the line -will order baseline labs -informed patient to watch for any shortness of breath as it would be a reason to admit patient as inpatient and initiate treatment -answered all of patient's and his wife's questions in detail -Will start Nystatin after finishing Clotrimazole to manage thrush symptoms -will start Prednisone to  improve patient's swallowing issues -would recommend patient to be seen by a radiology oncologist to consider shrinking a tonsillar mass if patient is experiencing active symptoms -not unreasonable to be seen by ENT for further evaluation of tonsillar mass  FOLLOW-UP: -Urgent referral to Radiation oncology to consider palliative ISRT to nasopharyngeal mass and tonsillar/pharyngeal mass  from mantle cell lymphoma progressed on 2nd line treatment -labs today -RTC with Dr Candise Che with labs and to start [facility] in 2 weeks  The total time spent in the appointment was *** minutes* .  All of the patient's questions were answered with apparent satisfaction. The patient knows to call the clinic with any problems, questions or concerns.   Wyvonnia Lora MD MS AAHIVMS [facility] Hematology/Oncology Physician [facility]  .*Total Encounter Time as defined by the Centers for Medicare and Medicaid Services includes, in addition to the face-to-face time of a patient visit (documented in the note above) non-face-to-face time: obtaining and reviewing outside history, ordering and reviewing medications, tests or procedures, care coordination (communications with other health care professionals or caregivers) and documentation in the medical record.    I,Mitra Faeizi,acting as a Neurosurgeon for Wyvonnia Lora, MD.,have documented all relevant documentation on the behalf of Wyvonnia Lora, MD,as directed by  Wyvonnia Lora, MD while in the presence of Wyvonnia Lora, MD.  ***

## 2023-03-23 NOTE — Telephone Encounter (Signed)
Oral Chemotherapy Pharmacist Encounter  I spoke with patient's wife, Jerry Tran, for overview of: Imbruvica (ibrutinib) for the treatment of mantle cell lymphoma in conjunction with rituximab, planned duration until disease progression or unacceptable toxicity.   Counseled patient's wife on administration, dosing, side effects, monitoring, drug-food interactions, safe handling, storage, and disposal.  Patient will take Imbruvica 420mg  tablet, 1 tablet (420mg ) by mouth once daily.  Patient will take Imbruvica at approximately the same time each day with a full glass of water and maintain adequate hydration throughout the day.  Patient's wife knows that patient will need to avoid grapefruit or grapefruit juice while on therapy with Imbruvica.  Imbruvica start date: 03/24/23 (patient's wife will pick up at pharmacy after MD OV on 03/23/23).  Adverse effects include but are not limited to: bruising/bleeding, decreased blood counts, N/V, diarrhea, musculoskeletal pain, arthralgias, peripheral edema. Diarrhea: Patient's wife will obtain Imodium (loperamide) to have on hand if patient experiences diarrhea. Patient's wife knows to alert the office of 4 or more loose stools above baseline.  Reviewed importance of keeping a medication schedule and plan for any missed doses. No barriers to medication adherence identified.  Medication reconciliation performed and medication/allergy list updated.  All questions answered.  [name] voiced understanding and appreciation.   Medication education handout will be given to patient in clinic on 03/23/23. Patient's wife knows to call the office with questions or concerns. Oral Chemotherapy Clinic phone number provided.   Lenord Carbo, PharmD, BCPS, BCOP Hematology/Oncology Clinical Pharmacist [facility] Oral Chemotherapy Navigation Clinics [phone] 03/23/2023 11:18 AM

## 2023-03-23 NOTE — Telephone Encounter (Signed)
5/24 @ 3:44 pm Left voicemail for patient to call our office to confirm, if he will be using [employer], before we can schedule him.  If so, will need authorization from [facility].

## 2023-03-27 ENCOUNTER — Telehealth: Payer: Self-pay | Admitting: Radiation Oncology

## 2023-03-27 NOTE — Telephone Encounter (Signed)
5/28 @ 8:33 am follow up call to patient to see if he will be using [employer] or [facility] Medicare.  Waiting on call back before we can schedule patient.

## 2023-03-29 ENCOUNTER — Other Ambulatory Visit: Payer: Self-pay

## 2023-03-29 ENCOUNTER — Encounter: Payer: Self-pay | Admitting: Hematology

## 2023-03-30 ENCOUNTER — Telehealth: Payer: Self-pay | Admitting: Hematology

## 2023-03-31 ENCOUNTER — Other Ambulatory Visit: Payer: Self-pay

## 2023-04-01 ENCOUNTER — Other Ambulatory Visit: Payer: Self-pay

## 2023-04-01 ENCOUNTER — Emergency Department (HOSPITAL_COMMUNITY)
Admission: EM | Admit: 2023-04-01 | Discharge: 2023-04-01 | Disposition: A | Discharge status: Home / Self Care | Payer: No Typology Code available for payment source | Attending: Emergency Medicine | Admitting: Emergency Medicine

## 2023-04-01 ENCOUNTER — Encounter (HOSPITAL_COMMUNITY): Payer: Self-pay

## 2023-04-01 DIAGNOSIS — N189 Chronic kidney disease, unspecified: Secondary | ICD-10-CM | POA: Insufficient documentation

## 2023-04-01 DIAGNOSIS — Z8546 Personal history of malignant neoplasm of prostate: Secondary | ICD-10-CM | POA: Diagnosis not present

## 2023-04-01 DIAGNOSIS — I509 Heart failure, unspecified: Secondary | ICD-10-CM | POA: Diagnosis not present

## 2023-04-01 DIAGNOSIS — R300 Dysuria: Secondary | ICD-10-CM | POA: Diagnosis present

## 2023-04-01 LAB — URINALYSIS, ROUTINE W REFLEX MICROSCOPIC
Bacteria, UA: NONE SEEN
Bilirubin Urine: NEGATIVE
Glucose, UA: NEGATIVE mg/dL
Ketones, ur: NEGATIVE mg/dL
Nitrite: NEGATIVE
Protein, ur: NEGATIVE mg/dL
Specific Gravity, Urine: 1.024 (ref 1.005–1.030)
pH: 6 (ref 5.0–8.0)

## 2023-04-01 NOTE — ED Provider Notes (Signed)
[place] EMERGENCY DEPARTMENT AT [facility] Provider Note   CSN: [id number] Arrival date & time: 04/01/23  1601     History  Chief Complaint  Patient presents with   Dysuria    Jerry Tran is a 77 y.o. male.   Dysuria Presenting symptoms: dysuria   Context: spontaneously   Relieved by:  Nothing Worsened by:  Nothing Ineffective treatments:  None tried Associated symptoms: urinary frequency and urinary hesitation   Associated symptoms: no abdominal pain, no diarrhea, no fever, no groin pain, no nausea and no vomiting        Home Medications Prior to Admission medications   Medication Sig Start Date End Date Taking? Authorizing Provider  ammonium lactate (LAC-HYDRIN) 12 % lotion Apply 1 application topically 2 (two) times daily as needed for dry skin.    [provider]  atorvastatin (LIPITOR) 20 MG tablet Take 10 mg by mouth at bedtime.    [provider]  Cholecalciferol (VITAMIN D3 PO) Take 1 tablet by mouth every morning.    [provider]  diclofenac Sodium (VOLTAREN) 1 % GEL Apply 2 g topically 4 (four) times daily as needed (pain).    [provider]  feeding supplement (ENSURE ENLIVE / ENSURE PLUS) LIQD Take 237 mLs by mouth 2 (two) times daily between meals. 12/11/21   Regalado, Belkys A, MD  flecainide (TAMBOCOR) 100 MG tablet Take 100 mg by mouth every 12 (twelve) hours.    [provider]  fluticasone (FLONASE) 50 MCG/ACT nasal spray Place 1 spray into both nostrils daily as needed for allergies or rhinitis.    [provider]  furosemide (LASIX) 20 MG tablet Take 20 mg by mouth.    [provider]  hydrocortisone (ANUSOL-HC) 25 MG suppository Place 1 suppository (25 mg total) rectally 2 (two) times daily. 01/30/23   Briant Cedar, PA-C  hydroxypropyl methylcellulose / hypromellose (ISOPTO TEARS / GONIOVISC) 2.5 % ophthalmic solution Place 2 drops into both eyes 2 (two) times daily as  needed for dry eyes.    [provider]  ibrutinib (IMBRUVICA) 420 MG tablet Take 1 tablet (420 mg total) by mouth daily. Take with a glass of water. 02/27/23   [name], MD  LORazepam (ATIVAN) 0.5 MG tablet Take 1 tablet (0.5 mg total) by mouth every 6 (six) hours as needed (Nausea or vomiting). 12/16/21   [name], MD  metoprolol tartrate (LOPRESSOR) 25 MG tablet Take 12.5 mg by mouth 2 (two) times daily. Take with flecainide    [provider]  mirtazapine (REMERON) 30 MG tablet Take 30 mg by mouth at bedtime. 01/31/22   [provider]  nystatin (MYCOSTATIN) 100000 UNIT/ML suspension Take 5 mLs (500,000 Units total) by mouth 4 (four) times daily. 03/23/23   [name], MD  omeprazole (PRILOSEC) 20 MG capsule Take 20 mg by mouth daily before breakfast.    [provider]  potassium chloride SA (KLOR-CON M) 20 MEQ tablet TAKE 1 TABLET(20 MEQ) BY MOUTH TWICE DAILY 03/01/23   [name], MD  prazosin (MINIPRESS) 2 MG capsule Take 6 mg by mouth at bedtime.    [provider]  terbinafine (LAMISIL) 1 % cream Apply 1 application topically 2 (two) times daily as needed (athlete's foot).    [provider]  torsemide (DEMADEX) 10 MG tablet Take 20 mg by mouth daily.    [provider]  triamcinolone cream (KENALOG) 0.1 % Apply 1 application topically  2 (two) times daily as needed (rash).    [provider]      Allergies    Sildenafil    Review of Systems   Review of Systems  Constitutional:  Negative for chills, fatigue and fever.  HENT:  Negative for congestion.   Respiratory:  Negative for chest tightness, shortness of breath and wheezing.   Cardiovascular:  Negative for chest pain and palpitations.  Gastrointestinal:  Negative for abdominal pain, diarrhea, nausea and vomiting.  Genitourinary:  Positive for dysuria, frequency, hesitancy and urgency. Negative for genital sores.   Musculoskeletal:  Negative for back pain.  Neurological:  Negative for light-headedness and headaches.  Psychiatric/Behavioral:  Negative for agitation.   All other systems reviewed and are negative.   Physical Exam Updated Vital Signs BP 103/82 (BP Location: Left Arm)   Pulse 78   Temp 98.1 F (36.7 C) (Oral)   Resp 19   Ht 6\' 4"  (1.93 m)   Wt 79.4 kg   SpO2 100%   BMI 21.30 kg/m  Physical Exam Vitals and nursing note reviewed.  Constitutional:      General: He is not in acute distress.    Appearance: He is well-developed. He is not ill-appearing, toxic-appearing or diaphoretic.  HENT:     Head: Normocephalic and atraumatic.     Nose: Nose normal.     Mouth/Throat:     Mouth: Mucous membranes are moist.  Eyes:     Conjunctiva/sclera: Conjunctivae normal.  Cardiovascular:     Rate and Rhythm: Normal rate and regular rhythm.     Heart sounds: No murmur heard. Pulmonary:     Effort: Pulmonary effort is normal. No respiratory distress.     Breath sounds: Normal breath sounds. No wheezing, rhonchi or rales.  Chest:     Chest wall: No tenderness.  Abdominal:     Palpations: Abdomen is soft.     Tenderness: There is no abdominal tenderness. There is no right CVA tenderness, left CVA tenderness, guarding or rebound.  Musculoskeletal:        General: No swelling or tenderness.     Cervical back: Neck supple.  Skin:    General: Skin is warm and dry.     Capillary Refill: Capillary refill takes less than 2 seconds.     Findings: No erythema.  Neurological:     Mental Status: He is alert.  Psychiatric:        Mood and Affect: Mood normal.     ED Results / Procedures / Treatments   Labs (all labs ordered are listed, but only abnormal results are displayed) Labs Reviewed  URINALYSIS, ROUTINE W REFLEX MICROSCOPIC - Abnormal; Notable for the following components:      Result Value   APPearance CLOUDY (*)    Hgb urine dipstick SMALL (*)    Leukocytes,Ua MODERATE (*)     All other components within normal limits  URINE CULTURE    EKG None  Radiology No results found.  Procedures Procedures    Medications Ordered in ED Medications - No data to display  ED Course/ Medical Decision Making/ A&P                             Medical Decision Making Amount and/or Complexity of Data Reviewed Labs: ordered.    Jerry Tran is a 77 y.o. male with a past medical history significant for CKD, paroxysmal atrial fibrillation, dyslipidemia, PTSD, GERD, CHF, prostate  cancer, and lymphoma who presents with dysuria.  According to patient, he thinks he is having some urinary changes related to his chemo and radiation therapy but wanted to make sure he not have an infection.  He reports some dysuria urgency and hesitancy but is denying fevers or chills.  Denies any abdominal pain chest pain or back pain.  Denies flank pain or hematuria.  He denies any trauma.  Denies any rashes.  Denies any testicle or groin or scrotal pain.  Denies any inguinal pain.  He just had some urinary symptoms with some mild discomfort when he urinated and wanted to make sure he not have a UTI.  He reports he had a UTI years ago and felt similar.  Otherwise he is feeling well.  On exam, lungs clear and chest nontender.  Abdomen nontender.  Flanks and back nontender.  Good pulses in extremities.  Patient resting comfortably.  He deferred a GU exam given lack of GU symptoms.  We had a shared decision-making conversation given his lack of other complaints, he did not want to get other workup but agreed to get a urinalysis and culture.  Urinalysis did not show bacteria or nitrites but did have some leukocytes.  We had a culture ordered and patient be discharged home to follow-up on the culture results and follow-up with his PCP.  Patient agrees with plan of care and has no complaints.  He still wants to hold on other workup.  There are no questions or concerns and patient discharged in good  condition to follow-up with cultures.            Final Clinical Impression(s) / ED Diagnoses Final diagnoses:  Dysuria    Rx / DC Orders ED Discharge Orders     None      Clinical Impression: 1. Dysuria     Disposition: Discharge  Condition: Good  I have discussed the results, Dx and Tx plan with the pt(& family if present). He/she/they expressed understanding and agree(s) with the plan. Discharge instructions discussed at great length. Strict return precautions discussed and pt &/or family have verbalized understanding of the instructions. No further questions at time of discharge.    Discharge Medication List as of 04/01/2023  8:20 PM      Follow Up: Center, [facility] [address] [place] [address] [phone]     [facility] Emergency Department at [facility] [address] [id number] mc [place] [address] [phone]       Harlie Buening, Canary Brim, MD 04/01/23 2232

## 2023-04-01 NOTE — Discharge Instructions (Signed)
Your history, exam, and evaluation today did not show convincing evidence of infection in the urine at this time.  The urinalysis showed no bacteria and no nitrites but did show some leukocytes as we discussed.  A culture was sent and if something grows positive that is concerning for infection they will call you and call in antibiotics.  Please rest and stay hydrated and follow-up with your primary team.  We had a shared decision-making conversation and agreed to hold on more extensive lab workup given your lack of other complaints today.  If any symptoms change or worsen acutely, please return to the nearest emergency department.

## 2023-04-01 NOTE — ED Triage Notes (Signed)
Patient has had burning urination and increased frequency for 2 days. No blood in urine. 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 No abdominal pain.

## 2023-04-02 NOTE — Progress Notes (Signed)
Radiation Oncology         [phone] ________________________________  Initial Outpatient Consultation  Name: Jerry Tran MRN: [id number]  Date: 04/03/2023  DOB: May 22, 1946  WU:[id number], [facility]  [place], Corene Cornea, MD   REFERRING PHYSICIAN: [name], MD  DIAGNOSIS:    ICD-10-CM   1. 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 male who was initially diagnosed with mantle cell lymphoma in February of 2023 after presenting to medical attention on 12/16/21 with hematuria, dizziness, progressive weakness, progressive weight loss, and shortness of breath. He was admitted and underwent CT's of the chest and abdomen at that time which showed abdominal pelvic lymphadenopathy with significant splenomegaly, and axillary mediastinal lymphadenopathy. He subsequently underwent biopsies of a right supraclavicular lymph node on 12/09/2021 which showed findings consistent with newly diagnosed mantle cell lymphoma. He was also likely suspected to have bone marrow involvement based on peripheral blood flow cytometry being positive for mantle cell lymphoma.   He was accordingly referred to Dr. Candise Che and began chemotherapy consisting of Bendamustine and Rituxan on 12/23/21. 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 He tolerated chemotherapy well overall and completed his 6th and final cycle on 04/26/23.    PET/CT performed on 05/25/2022  showed a complete metabolic response of the patient's mantle cell lymphoma.   Dr. Candise Che advised that he proceed with Rituxan therapy for at least 2 to 3 years if tolerated. However, the patient was lost to follow up with Dr. Candise Che until 10/04/22. During which time, the patient endorsed new cervical lymphadenopathy x 3 weeks. He also endorsed difficulty swallowing food (solid food), a new enlarged lymph node in his groin x 2 days, and new lump on his head which he attributed to hitting his head.   He also presented to the ED on 10/04/23 dry cough sore throat over the last 3 to 4 weeks. Soft tissue neck CT performed at that time showed irregular bulky enlargement of the lymphoid tissues including the palatine tonsils and adenoidal soft tissues, right worse than left. CT also showed evidence of bilateral cervical, supraclavicular, and axillary adenopathy, also presumably related to history of lymphoma. Maxillofacial and head CT's were also performed in the ED which showed an indeterminate 1.6 x 1.2 cm right parietal scalp soft tissue density, and a likely subacute 3 mm right subdural hematoma.   The patient also reports being seen in the emergency department prior to this where he was diagnosed with uveitis. He was given steroids and antibiotics with some improvement but he continued to have symptoms. Dr. Candise Che was consulted at that time (12/05) who recommended proceeding with steroids and OP follow-up for biopsies.   Restaging PET scan on 10/09/22 demonstrated extensive recurrent hypermetabolic lymphadenopathy throughout the neck, chest, abdomen and pelvis consistent with recurrent lymphoma. PET also showed a hypermetabolic soft tissue nodule medial to the left areola consistent with a soft tissue metastasis; possible intramuscular lesions in the left upper arm; and prominent  involvement of the lymphoid tissue of Waldeyer's ring with resulting nasopharyngeal airway obstruction. PET otherwise showed no evidence  of solid organ or osseous involvement.   Subsequently, the patient proceeded with biopsies on 10/27/23. Biopsy of the left inguinal lymph node collected showed findings consistent with non-hodgkin b-cell lymphoma consistent with mantle cell lymphoma.   Per Dr. Clyda Greener recommendation, the patient ultimately opted to proceed with chemotherapy consisting of R-CHOP which he began on 11/07/22. He is now s/p 7 cycles of R-CHOP (last cycle on 02/20/23).   PET scan 01/15/2023 revealed an interval response to therapy evidenced by decrease in size and hypermetabolism involving the nasopharyngeal mass as well as lymph nodes throughout the neck, chest, abdomen and pelvis. PET also showed diffuse uptake throughout the visualized osseous structures, and trace bilateral pleural effusions.  PET scan on 03/16/23 however demonstrated: interval progression of lymphoma as evidenced by enlarging and increasingly hypermetabolic nasopharyngeal and bilateral tonsillar masses as well as inguinal adenopathy. An index left inguinal nodule was also appreciated measuring 1.6 cm, previously 1.4 cm.   In light lymphoma progression while undergoing second line treatment, Dr. Candise Che recommends urgent palliative ISRT to nasopharyngeal mass and tonsillar/pharyngeal mass.   Swallowing issues, if any: Throat fullness and swallowing issues which have recently worsened and caused him to present to the emergency room on 03/20/2023. His wife has noticed that the patient has been choking on solid foods.   Weight Changes: ***  Pain status: hemorrhoidal pain as noted below  Other symptoms: Recent issues with hemorrhoidal pain, daily loose stools, and hoarse voice, oral thrush managed with Clotrimazole since 03/20/2023. Continues to have difficulty breathing through his nose and his voice continues to be nasally   Tobacco history, if any: former smoker 15 pack year smoking history   ETOH abuse, if any: none  Prior cancers, if any: prostate  cancer as noted below  PREVIOUS RADIATION THERAPY: Yes   History of prostate cancer treated with radiation therapy at the [facility] and 1 dose of Lupron in June 2022   PAST MEDICAL HISTORY:  has a past medical history of Arthritis, Chronic kidney disease, stage 3b (HCC) (12/09/2021), Dyslipidemia, GERD (gastroesophageal reflux disease), Hypertension, PAF (paroxysmal atrial fibrillation) (HCC), Prostate cancer (HCC) (03/2021), and PTSD (post-traumatic stress disorder).    PAST SURGICAL HISTORY: Past Surgical History:  Procedure Laterality Date   IR IMAGING GUIDED PORT INSERTION  11/06/2022   LIPOMA EXCISION Right 12/09/2021   Procedure: RIGHT SUPERCLAVICULAR LYMPH NODE EXCISION;  Surgeon: Andria Meuse, MD;  Location: [facility] OR;  Service: General;  Laterality: Right;    FAMILY HISTORY: family history is not on file.  SOCIAL HISTORY:  reports that he has quit smoking. His smoking use included cigarettes. He has a 15.00 pack-year smoking history. He has never used smokeless tobacco. He reports that he does not currently use alcohol. He reports that he does not currently use drugs after having used the following drugs: Marijuana and Cocaine.  ALLERGIES: Sildenafil  MEDICATIONS:  Current Outpatient Medications  Medication Sig Dispense Refill   ammonium lactate (LAC-HYDRIN) 12 % lotion Apply 1 application topically 2 (two) times daily as needed for dry skin.     atorvastatin (LIPITOR) 20 MG tablet Take 10 mg by mouth at bedtime.     Cholecalciferol (VITAMIN D3 PO) Take 1 tablet by mouth every morning.     diclofenac Sodium (VOLTAREN) 1 % GEL Apply 2 g topically 4 (four) times daily as needed (pain).     feeding supplement (  ENSURE ENLIVE / ENSURE PLUS) LIQD Take 237 mLs by mouth 2 (two) times daily between meals. 237 mL 12   flecainide (TAMBOCOR) 100 MG tablet Take 100 mg by mouth every 12 (twelve) hours.     fluticasone (FLONASE) 50 MCG/ACT nasal spray Place 1 spray into both nostrils daily  as needed for allergies or rhinitis.     furosemide (LASIX) 20 MG tablet Take 20 mg by mouth.     hydrocortisone (ANUSOL-HC) 25 MG suppository Place 1 suppository (25 mg total) rectally 2 (two) times daily. 12 suppository 0   hydroxypropyl methylcellulose / hypromellose (ISOPTO TEARS / GONIOVISC) 2.5 % ophthalmic solution Place 2 drops into both eyes 2 (two) times daily as needed for dry eyes.     ibrutinib (IMBRUVICA) 420 MG tablet Take 1 tablet (420 mg total) by mouth daily. Take with a glass of water. 28 tablet 0   LORazepam (ATIVAN) 0.5 MG tablet Take 1 tablet (0.5 mg total) by mouth every 6 (six) hours as needed (Nausea or vomiting). 30 tablet 0   metoprolol tartrate (LOPRESSOR) 25 MG tablet Take 12.5 mg by mouth 2 (two) times daily. Take with flecainide     mirtazapine (REMERON) 30 MG tablet Take 30 mg by mouth at bedtime.     nystatin (MYCOSTATIN) 100000 UNIT/ML suspension Take 5 mLs (500,000 Units total) by mouth 4 (four) times daily. 200 mL 2   omeprazole (PRILOSEC) 20 MG capsule Take 20 mg by mouth daily before breakfast.     potassium chloride SA (KLOR-CON M) 20 MEQ tablet TAKE 1 TABLET(20 MEQ) BY MOUTH TWICE DAILY 180 tablet 2   prazosin (MINIPRESS) 2 MG capsule Take 6 mg by mouth at bedtime.     terbinafine (LAMISIL) 1 % cream Apply 1 application topically 2 (two) times daily as needed (athlete's foot).     torsemide (DEMADEX) 10 MG tablet Take 20 mg by mouth daily.     triamcinolone cream (KENALOG) 0.1 % Apply 1 application topically 2 (two) times daily as needed (rash).     No current facility-administered medications for this encounter.    REVIEW OF SYSTEMS:  Notable for that above.   PHYSICAL EXAM:  vitals were not taken for this visit.   General: Alert and oriented, in no acute distress HEENT: Head is normocephalic. Extraocular movements are intact. Oropharynx is notable for ***. Neck: Neck is notable for *** Heart: Regular in rate and rhythm with no murmurs, rubs, or  gallops. Chest: Clear to auscultation bilaterally, with no rhonchi, wheezes, or rales. Abdomen: Soft, nontender, nondistended, with no rigidity or guarding. Extremities: No cyanosis or edema. Lymphatics: see Neck Exam Skin: No concerning lesions. Musculoskeletal: symmetric strength and muscle tone throughout. Neurologic: Cranial nerves II through XII are grossly intact. No obvious focalities. Speech is fluent. Coordination is intact. Psychiatric: Judgment and insight are intact. Affect is appropriate.   ECOG = ***  0 - Asymptomatic (Fully active, able to carry on all predisease activities without restriction)  1 - Symptomatic but completely ambulatory (Restricted in physically strenuous activity but ambulatory and able to carry out work of a light or sedentary nature. For example, light housework, office work)  2 - Symptomatic, <50% in bed during the day (Ambulatory and capable of all self care but unable to carry out any work activities. Up and about more than 50% of waking hours)  3 - Symptomatic, >50% in bed, but not bedbound (Capable of only limited self-care, confined to bed or chair 50% or more  of waking hours)  4 - Bedbound (Completely disabled. Cannot carry on any self-care. Totally confined to bed or chair)  5 - Death   Santiago Glad MM, Creech RH, Tormey DC, et al. [phone]). "Toxicity and response criteria of the [facility]". Am. Evlyn Clines. Oncol. 5 (6): 649-55   LABORATORY DATA:  Lab Results  Component Value Date   WBC 5.3 03/23/2023   HGB 9.8 (L) 03/23/2023   HCT 31.2 (L) 03/23/2023   MCV 87.9 03/23/2023   PLT 197 03/23/2023   CMP     Component Value Date/Time   NA 141 03/23/2023 1513   K 4.4 03/23/2023 1513   CL 107 03/23/2023 1513   CO2 29 03/23/2023 1513   GLUCOSE 85 03/23/2023 1513   BUN 28 (H) 03/23/2023 1513   CREATININE 1.30 (H) 03/23/2023 1513   CALCIUM 9.6 03/23/2023 1513   PROT 6.9 03/23/2023 1513   ALBUMIN 3.9 03/23/2023 1513   AST  14 (L) 03/23/2023 1513   ALT 7 03/23/2023 1513   ALKPHOS 66 03/23/2023 1513   BILITOT 0.3 03/23/2023 1513   GFRNONAA 57 (L) 03/23/2023 1513      No results found for: "TSH"   RADIOGRAPHY: NM PET Image Restag (PS) Skull Base To Thigh  Result Date: 03/21/2023 CLINICAL DATA:  Subsequent treatment strategy for mantle cell lymphoma. EXAM: NUCLEAR MEDICINE PET SKULL BASE TO THIGH TECHNIQUE: 9.0 mCi F-18 FDG was injected intravenously. Full-ring PET imaging was performed from the skull base to thigh after the radiotracer. CT data was obtained and used for attenuation correction and anatomic localization. Fasting blood glucose: 89 mg/dl COMPARISON:  11/91/4782. FINDINGS: Mediastinal blood pool activity: SUV max 2.0 Liver activity: SUV max 3.0 NECK: Nasopharyngeal mass has enlarged, measuring 2.9 x 3.9 cm (4/14), SUV max 11.1, previously 1.9 x 3.0 cm and SUV max 8.5. Masslike tonsillar enlargement, also increased from prior but difficult to measure. For reference, right paracentral component measures 2.5 x 3.3 cm (4/21), SUV max 8.9, previously 0.9 x 2.3 cm and SUV max 6.7. Hypermetabolic level II lymph nodes with index lymph node on the right measuring 10 mm (4/22), SUV max 7.5, compared to 6 mm and SUV max 2.9 previously. Incidental CT findings: None. CHEST: No abnormal hypermetabolism. Incidental CT findings: Right IJ Port-A-Cath terminates in the right atrium. Atherosclerotic calcification of the aorta. Heart is enlarged. Small amount of pericardial fluid is unchanged and may be physiologic. ABDOMEN/PELVIS: Hypermetabolic inguinal lymph nodes extend to the upper medial thighs. Index left inguinal lymph node measures 1.6 cm (4/77), SUV max 5.4, previously 1.4 cm and SUV max 1.8. No additional abnormal hypermetabolism. Incidental CT findings: Probable cysts in the liver. Liver, gallbladder, adrenal glands, kidneys, spleen, pancreas, stomach and bowel are otherwise grossly unremarkable. SKELETON: No abnormal  hypermetabolism. Incidental CT findings: Degenerative changes in the spine. IMPRESSION: 1. Interval progression of lymphoma as evidenced by enlarging and increasingly hypermetabolic nasopharyngeal and bilateral tonsillar masses as well as inguinal adenopathy. 2.  Aortic atherosclerosis (ICD10-I70.0). Electronically Signed   By: Leanna Battles M.D.   On: 03/21/2023 09:56      IMPRESSION/PLAN:  This is a delightful patient with head and neck cancer. I *** recommend radiotherapy for this patient.  We discussed the potential risks, benefits, and side effects of radiotherapy. We talked in detail about acute and late effects. We discussed that some of the most bothersome acute effects may be mucositis, dysgeusia, salivary changes, skin irritation, hair loss, dehydration, weight loss and fatigue. We talked  about late effects which include but are not necessarily limited to dysphagia, hypothyroidism, nerve injury, vascular injury, spinal cord injury, xerostomia, trismus, neck edema, and potential injury to any of the tissues in the head and neck region. No guarantees of treatment were given. A consent form was signed and placed in the patient's medical record. The patient is enthusiastic about proceeding with treatment. I look forward to participating in the patient's care.    Simulation (treatment planning) will take place ***  We also discussed that the treatment of head and neck cancer is a multidisciplinary process to maximize treatment outcomes and quality of life. For this reason the following referrals have been or will be made:  *** Medical oncology to discuss chemotherapy   *** Dentistry for dental evaluation, possible extractions in the radiation fields, and /or advice on reducing risk of cavities, osteoradionecrosis, or other oral issues.  *** Nutritionist for nutrition support during and after treatment.  *** Speech language pathology for swallowing and/or speech therapy.  *** Social work  for social support.   *** Physical therapy due to risk of lymphedema in neck and deconditioning.  *** Baseline labs including TSH.  On date of service, in total, I spent *** minutes on this encounter. Patient was seen in person.  __________________________________________   Lonie Peak, MD  This document serves as a record of services personally performed by Lonie Peak, MD. It was created on her behalf by Neena Rhymes, a trained medical scribe. The creation of this record is based on the scribe's personal observations and the provider's statements to them. This document has been checked and approved by the attending provider.

## 2023-04-03 ENCOUNTER — Ambulatory Visit
Admission: RE | Admit: 2023-04-03 | Discharge: 2023-04-03 | Disposition: A | Discharge status: Home / Self Care | Payer: Medicare Other | Source: Ambulatory Visit | Attending: Radiation Oncology | Admitting: Radiation Oncology

## 2023-04-03 ENCOUNTER — Encounter: Payer: Self-pay | Admitting: Radiation Oncology

## 2023-04-03 ENCOUNTER — Other Ambulatory Visit: Payer: Self-pay

## 2023-04-03 VITALS — BP 100/74 | HR 86 | Temp 97.6°F | Resp 20 | Ht 76.0 in | Wt 180.2 lb

## 2023-04-03 DIAGNOSIS — E785 Hyperlipidemia, unspecified: Secondary | ICD-10-CM | POA: Insufficient documentation

## 2023-04-03 DIAGNOSIS — N1832 Chronic kidney disease, stage 3b: Secondary | ICD-10-CM | POA: Diagnosis not present

## 2023-04-03 DIAGNOSIS — C8318 Mantle cell lymphoma, lymph nodes of multiple sites: Secondary | ICD-10-CM | POA: Insufficient documentation

## 2023-04-03 DIAGNOSIS — Z9221 Personal history of antineoplastic chemotherapy: Secondary | ICD-10-CM | POA: Insufficient documentation

## 2023-04-03 DIAGNOSIS — Z87891 Personal history of nicotine dependence: Secondary | ICD-10-CM | POA: Insufficient documentation

## 2023-04-03 DIAGNOSIS — I480 Paroxysmal atrial fibrillation: Secondary | ICD-10-CM | POA: Insufficient documentation

## 2023-04-03 DIAGNOSIS — Z510 Encounter for antineoplastic radiation therapy: Secondary | ICD-10-CM | POA: Diagnosis not present

## 2023-04-03 DIAGNOSIS — I1310 Hypertensive heart and chronic kidney disease without heart failure, with stage 1 through stage 4 chronic kidney disease, or unspecified chronic kidney disease: Secondary | ICD-10-CM | POA: Diagnosis not present

## 2023-04-03 DIAGNOSIS — K219 Gastro-esophageal reflux disease without esophagitis: Secondary | ICD-10-CM | POA: Diagnosis not present

## 2023-04-03 DIAGNOSIS — Z79899 Other long term (current) drug therapy: Secondary | ICD-10-CM | POA: Insufficient documentation

## 2023-04-03 NOTE — Progress Notes (Signed)
Oncology Nurse Navigator Documentation   I Introduced myself as the H&N oncology nurse navigator that works with Dr. Basilio Cairo to whom he has been referred by Dr. Candise Che. I explained my role as his navigator, provided my contact information. I encouraged him to call with questions/concerns as he moves forward with appts and procedures. He and his wife verbalized understanding of information provided, expressed appreciation for my call. He will have CT simulation today as well and start 10 fractions next week.    Hedda Slade, RN, BSN, OCN Head & Neck Oncology Nurse Navigator [facility] [place] [phone] [facility] RN, BSN, OCN Head & Neck Oncology Nurse Navigator [facility] Phone # [phone]  Fax # [phone]

## 2023-04-03 NOTE — Progress Notes (Signed)
Lymphoma Location(s) / Histology: nasopharyngeal mass and tonsillar/pharyngeal mass from mantle cell lymphoma   NM PET Image Restag 03/16/2023  IMPRESSION: 1. Interval progression of lymphoma as evidenced by enlarging and increasingly hypermetabolic nasopharyngeal and bilateral tonsillar masses as well as inguinal adenopathy. 2.  Aortic atherosclerosis (ICD10-I70.0).  Jerry Tran presented with symptoms of: (per referral) patient with swallowing issues/choking sensation and nasal voice/fullness   Biopsies of (if applicable) revealed:  10-16-22 FINAL MICROSCOPIC DIAGNOSIS:   A. 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 []  [x]    Swallowing concerns? []  [x]    PEG? []  [x]     Referrals Yes No Comments  Social Work? []  [x]    Dentistry? []  [x]    Swallowing therapy? []  [x]    Nutrition? []  [x]    Med/Onc? [x]  []  Already sees Dr. Candise Che   Safety Issues Yes No Comments  Prior radiation? []  [x]    Pacemaker/ICD? []  [x]    Possible current pregnancy? []  [x]  Na, pt male  Is the patient on methotrexate? []  [x]     Tobacco/Marijuana/Snuff/ETOH use: former smoker  Past/Anticipated interventions by medical oncology, if any:  Dr. Candise Che 02-20-23 ASSESSMENT & PLAN:    78 y.o.  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   3) moderate to severe protein calorie malnutrition-He has lost  8lbs but is drinking x2-3 Ensure drinks per week.     Wt Readings from Last 3 Encounters:  02/20/23 180 lb 12.8 oz (82 kg)  01/18/23 188 lb 11.2 oz (85.6 kg)  01/09/23 192 lb 6.4 oz (87.3 kg)    4) Loose stools- He is having a few small, loose stools per day.   5) BLE swelling- resolved with compression socks.   6) Hemorrhoids- He reports pain with his hemorrhoid flare but denies any rectal bleeding. He has not seen GI or surgeon for this problem, but [facility]    7) Hoarse voice- He reports more difficulty projecting his voice. Denies any noticeable reflux but he does take acid suppression medication. He denies any significant seasonal allergies.   PLAN: -Discussed lab results from today, 02/20/23, in detail with patient and his wife. CBC showed WBC of 6.0K, hemoglobin improved to 9.6, and platelets improved to 194K. CMP WNL. -He is tolerating R-CHOP overall without significant toxicities. He will receive cycle 7 day 1 today. -Repeat PET scan x1 month after completing R-CHOP therapy. -Continue tuck's pads and start sitz baths as needed for hemorrhoids. -Recommended patient to consume adequate amounts of food and water to improve energy levels. -I advised him to increase his Ensure use to x1-2 drinks per day. -I encouraged him to increase his fiber intake given his loose stools. He was advised to use Imodium on days when his BM are more bothersome or when he plans to go out. -Follow up with GI as referred by [facility] provider. He sees his [place] provider in the next week. -Will refer  to ENT for further evaluation of his hoarse voice. -will plan to start Ibrutinib + Rituxan maintainence   FOLLOW-UP: Pet/ct in 3 weeks Starting Maintenance Rituxan in 4 weeks with portflush, labs and MD visit  Weight changes, if any, over the past 6 months: no  Recurrent fevers, or drenching night sweats, if any: no  Current Complaints / other details:  Pt had port placed on  11-06-22 Vitals:   04/03/23 0932  BP: 100/74  Pulse: 86  Resp: 20  Temp: 97.6 F (36.4 C)  SpO2: 100%  Weight: 81.7 kg  Height: 6\' 4"  (1.93 m)

## 2023-04-04 ENCOUNTER — Other Ambulatory Visit: Payer: Self-pay

## 2023-04-04 ENCOUNTER — Encounter: Payer: Self-pay | Admitting: Hematology

## 2023-04-04 LAB — URINE CULTURE: Culture: 10000 — AB

## 2023-04-05 ENCOUNTER — Other Ambulatory Visit: Payer: Self-pay

## 2023-04-05 ENCOUNTER — Telehealth (HOSPITAL_BASED_OUTPATIENT_CLINIC_OR_DEPARTMENT_OTHER): Payer: Self-pay

## 2023-04-05 DIAGNOSIS — C8318 Mantle cell lymphoma, lymph nodes of multiple sites: Secondary | ICD-10-CM

## 2023-04-05 NOTE — Telephone Encounter (Signed)
Post ED Visit - Positive Culture Follow-up  Culture report reviewed by antimicrobial stewardship pharmacist: [facility] []  Enzo Bi, Pharm.D. []  Celedonio Miyamoto, Pharm.D., BCPS AQ-ID []  Garvin Fila, Pharm.D., BCPS []  Georgina Pillion, Pharm.D., BCPS []  [place], [place].D., BCPS, AAHIVP []  Estella Husk, Pharm.D., BCPS, AAHIVP []  Lysle Pearl, PharmD, BCPS []  Phillips Climes, PharmD, BCPS []  Agapito Games, PharmD, BCPS []  Verlan Friends, PharmD []  Mervyn Gay, PharmD, BCPS []  Vinnie Level, PharmD  [facility] [x]  Sharin Mons, PharmD []  Greer Pickerel, PharmD []  Adalberto Cole, PharmD []  Perlie Gold, Rph []  Lonell Face) Jean Rosenthal, PharmD []  Earl Many, PharmD []  Junita Push, PharmD []  Dorna Leitz, PharmD []  Terrilee Files, PharmD []  Lynann Beaver, PharmD []  Keturah Barre, PharmD []  Loralee Pacas, PharmD []  Bernadene Person, PharmD   Positive urine culture No treatment indicated, not consistent with infection may be related to chemo and no further patient follow-up is required at this time.  Jerry Tran 04/05/2023, 9:15 AM

## 2023-04-05 NOTE — Progress Notes (Signed)
ED Antimicrobial Stewardship Positive Culture Follow Up   Jerry Tran is an 77 y.o. male who presented to [facility] on 04/01/2023 with a chief complaint of  Chief Complaint  Patient presents with   Dysuria    Recent Results (from the past 720 hour(s))  Urine Culture     Status: Abnormal   Collection Time: 04/01/23  5:43 PM   Specimen: Urine, Clean Catch  Result Value Ref Range Status   Specimen Description   Final    URINE, CLEAN CATCH Performed at [facility], [address], [place], [place] [address]    Special Requests   Final    NONE Performed at [facility], [address] [place], [place] [address]    Culture 10,000 COLONIES/mL STAPHYLOCOCCUS HAEMOLYTICUS (A)  Final   Report Status 04/04/2023 FINAL  Final   Organism ID, Bacteria STAPHYLOCOCCUS HAEMOLYTICUS (A)  Final      Susceptibility   Staphylococcus haemolyticus - MIC*    CIPROFLOXACIN <=0.5 SENSITIVE Sensitive     GENTAMICIN <=0.5 SENSITIVE Sensitive     NITROFURANTOIN <=16 SENSITIVE Sensitive     OXACILLIN >=4 RESISTANT Resistant     TETRACYCLINE >=16 RESISTANT Resistant     VANCOMYCIN <=0.5 SENSITIVE Sensitive     TRIMETH/SULFA <=10 SENSITIVE Sensitive     CLINDAMYCIN RESISTANT Resistant     RIFAMPIN <=0.5 SENSITIVE Sensitive     Inducible Clindamycin POSITIVE Resistant     * 10,000 COLONIES/mL STAPHYLOCOCCUS HAEMOLYTICUS   A: Pt came in due to urinary symptoms.Pt culture grew only 10,000 colonies/ mL of staph. Haemolyticus. Pts UA had 21-50 squamous epithelial cells, which is consistent with contamination.   Plan: pt needs no treatment due to culture inconsistent with infection.  ED Provider: Aline August, PA-C   Erasmo Leventhal, Ilda Basset D Candidate 04/05/2023, 8:54 AM Clinical Pharmacist Monday - Friday phone -  [phone] Saturday - Sunday phone - [phone]

## 2023-04-06 ENCOUNTER — Other Ambulatory Visit: Payer: Self-pay

## 2023-04-06 ENCOUNTER — Inpatient Hospital Stay: Payer: Medicare Other | Attending: Hematology and Oncology

## 2023-04-06 ENCOUNTER — Inpatient Hospital Stay: Payer: Medicare Other

## 2023-04-06 VITALS — BP 115/63 | HR 68 | Temp 97.5°F | Resp 16

## 2023-04-06 DIAGNOSIS — C8318 Mantle cell lymphoma, lymph nodes of multiple sites: Secondary | ICD-10-CM | POA: Insufficient documentation

## 2023-04-06 DIAGNOSIS — E883 Tumor lysis syndrome: Secondary | ICD-10-CM

## 2023-04-06 DIAGNOSIS — Z7189 Other specified counseling: Secondary | ICD-10-CM

## 2023-04-06 DIAGNOSIS — E860 Dehydration: Secondary | ICD-10-CM | POA: Insufficient documentation

## 2023-04-06 DIAGNOSIS — D649 Anemia, unspecified: Secondary | ICD-10-CM | POA: Insufficient documentation

## 2023-04-06 DIAGNOSIS — Z5112 Encounter for antineoplastic immunotherapy: Secondary | ICD-10-CM | POA: Insufficient documentation

## 2023-04-06 DIAGNOSIS — Z510 Encounter for antineoplastic radiation therapy: Secondary | ICD-10-CM | POA: Diagnosis not present

## 2023-04-06 LAB — CMP (CANCER CENTER ONLY)
ALT: 8 U/L (ref 0–44)
AST: 15 U/L (ref 15–41)
Albumin: 3.7 g/dL (ref 3.5–5.0)
Alkaline Phosphatase: 64 U/L (ref 38–126)
Anion gap: 6 (ref 5–15)
BUN: 36 mg/dL — ABNORMAL HIGH (ref 8–23)
CO2: 25 mmol/L (ref 22–32)
Calcium: 9.4 mg/dL (ref 8.9–10.3)
Chloride: 103 mmol/L (ref 98–111)
Creatinine: 1.63 mg/dL — ABNORMAL HIGH (ref 0.61–1.24)
GFR, Estimated: 43 mL/min — ABNORMAL LOW (ref >60)
Glucose, Bld: 97 mg/dL (ref 70–99)
Potassium: 4.2 mmol/L (ref 3.5–5.1)
Sodium: 134 mmol/L — ABNORMAL LOW (ref 135–145)
Total Bilirubin: 0.3 mg/dL (ref 0.3–1.2)
Total Protein: 7.1 g/dL (ref 6.5–8.1)

## 2023-04-06 LAB — CBC WITH DIFFERENTIAL (CANCER CENTER ONLY)
Abs Immature Granulocytes: 0.07 10*3/uL (ref 0.00–0.07)
Basophils Absolute: 0 10*3/uL (ref 0.0–0.1)
Basophils Relative: 1 %
Eosinophils Absolute: 0 10*3/uL (ref 0.0–0.5)
Eosinophils Relative: 1 %
HCT: 30 % — ABNORMAL LOW (ref 39.0–52.0)
Hemoglobin: 9.7 g/dL — ABNORMAL LOW (ref 13.0–17.0)
Immature Granulocytes: 2 %
Lymphocytes Relative: 20 %
Lymphs Abs: 0.9 10*3/uL (ref 0.7–4.0)
MCH: 27.4 pg (ref 26.0–34.0)
MCHC: 32.3 g/dL (ref 30.0–36.0)
MCV: 84.7 fL (ref 80.0–100.0)
Monocytes Absolute: 0.6 10*3/uL (ref 0.1–1.0)
Monocytes Relative: 13 %
Neutro Abs: 2.9 10*3/uL (ref 1.7–7.7)
Neutrophils Relative %: 63 %
Platelet Count: 145 10*3/uL — ABNORMAL LOW (ref 150–400)
RBC: 3.54 MIL/uL — ABNORMAL LOW (ref 4.22–5.81)
RDW: 14.6 % (ref 11.5–15.5)
WBC Count: 4.4 10*3/uL (ref 4.0–10.5)
nRBC: 0 % (ref 0.0–0.2)

## 2023-04-06 LAB — LACTATE DEHYDROGENASE: LDH: 201 U/L — ABNORMAL HIGH (ref 98–192)

## 2023-04-06 MED ORDER — HEPARIN SOD (PORK) LOCK FLUSH 100 UNIT/ML IV SOLN
500.0000 [IU] | Freq: Once | INTRAVENOUS | Status: AC | PRN
  Administered 2023-04-06: 500 [IU]

## 2023-04-06 MED ORDER — SODIUM CHLORIDE 0.9% FLUSH
10.0000 mL | Freq: Once | INTRAVENOUS | Status: AC
  Administered 2023-04-06: 10 mL

## 2023-04-06 MED ORDER — SODIUM CHLORIDE 0.9 % IV SOLN: INTRAVENOUS | Status: DC

## 2023-04-06 MED ORDER — SODIUM CHLORIDE 0.9% FLUSH
10.0000 mL | Freq: Once | INTRAVENOUS | Status: AC | PRN
  Administered 2023-04-06: 10 mL

## 2023-04-06 NOTE — Progress Notes (Signed)
Pt reported feeling weak and falling this am "legs gave out". Per Dr Candise Che we will hold tx today and give pt a liter of Normal saline. Orders placed.

## 2023-04-06 NOTE — Patient Instructions (Signed)

## 2023-04-09 ENCOUNTER — Other Ambulatory Visit: Payer: Self-pay

## 2023-04-10 ENCOUNTER — Other Ambulatory Visit: Payer: Self-pay

## 2023-04-11 ENCOUNTER — Ambulatory Visit
Admission: RE | Admit: 2023-04-11 | Discharge: 2023-04-11 | Disposition: A | Discharge status: Home / Self Care | Payer: Medicare Other | Source: Ambulatory Visit | Attending: Radiation Oncology | Admitting: Radiation Oncology

## 2023-04-11 ENCOUNTER — Other Ambulatory Visit: Payer: Self-pay

## 2023-04-11 DIAGNOSIS — Z510 Encounter for antineoplastic radiation therapy: Secondary | ICD-10-CM | POA: Diagnosis not present

## 2023-04-11 LAB — RAD ONC ARIA SESSION SUMMARY
Course Elapsed Days: 0
Plan Fractions Treated to Date: 1
Plan Prescribed Dose Per Fraction: 2.5 Gy
Plan Total Fractions Prescribed: 10
Plan Total Prescribed Dose: 25 Gy
Reference Point Dosage Given to Date: 2.5 Gy
Reference Point Session Dosage Given: 2.5 Gy
Session Number: 1

## 2023-04-11 NOTE — Progress Notes (Signed)
Oncology Nurse Navigator Documentation   To provide support, encouragement and care continuity, met with Mr. Quintero for his initial RT.  He was accompanied by his wife. I reviewed the 2-step treatment process, answered questions.  Mr. Kinslow completed treatment without difficulty, denied questions/concerns. I reviewed the registration/arrival procedure for subsequent treatments. I encouraged them to call me with questions/concerns as treatments proceed.   Hedda Slade RN, BSN, OCN Head & Neck Oncology Nurse Navigator [facility] at [facility] Phone # [phone]  Fax # [phone]

## 2023-04-12 ENCOUNTER — Ambulatory Visit
Admission: RE | Admit: 2023-04-12 | Discharge: 2023-04-12 | Disposition: A | Discharge status: Home / Self Care | Payer: Medicare Other | Source: Ambulatory Visit | Attending: Radiation Oncology | Admitting: Radiation Oncology

## 2023-04-12 ENCOUNTER — Other Ambulatory Visit: Payer: Self-pay

## 2023-04-12 ENCOUNTER — Other Ambulatory Visit: Payer: Self-pay | Admitting: Hematology

## 2023-04-12 ENCOUNTER — Other Ambulatory Visit (HOSPITAL_COMMUNITY): Payer: Self-pay

## 2023-04-12 DIAGNOSIS — Z510 Encounter for antineoplastic radiation therapy: Secondary | ICD-10-CM | POA: Diagnosis not present

## 2023-04-12 DIAGNOSIS — C8318 Mantle cell lymphoma, lymph nodes of multiple sites: Secondary | ICD-10-CM

## 2023-04-12 LAB — RAD ONC ARIA SESSION SUMMARY
Course Elapsed Days: 1
Plan Fractions Treated to Date: 2
Plan Prescribed Dose Per Fraction: 2.5 Gy
Plan Total Fractions Prescribed: 10
Plan Total Prescribed Dose: 25 Gy
Reference Point Dosage Given to Date: 5 Gy
Reference Point Session Dosage Given: 2.5 Gy
Session Number: 2

## 2023-04-12 MED ORDER — IBRUTINIB 420 MG PO TABS
420.0000 mg | ORAL_TABLET | Freq: Every day | ORAL | 0 refills | Status: DC
Start: 2023-04-12 — End: 2023-05-16
  Filled 2023-04-12: qty 28, 28d supply, fill #0

## 2023-04-13 ENCOUNTER — Ambulatory Visit
Admission: RE | Admit: 2023-04-13 | Discharge: 2023-04-13 | Disposition: A | Discharge status: Home / Self Care | Payer: Medicare Other | Source: Ambulatory Visit | Attending: Radiation Oncology | Admitting: Radiation Oncology

## 2023-04-13 ENCOUNTER — Other Ambulatory Visit: Payer: Self-pay

## 2023-04-13 ENCOUNTER — Other Ambulatory Visit (HOSPITAL_COMMUNITY): Payer: Self-pay

## 2023-04-13 DIAGNOSIS — Z510 Encounter for antineoplastic radiation therapy: Secondary | ICD-10-CM | POA: Diagnosis not present

## 2023-04-13 LAB — RAD ONC ARIA SESSION SUMMARY
Course Elapsed Days: 2
Plan Fractions Treated to Date: 3
Plan Prescribed Dose Per Fraction: 2.5 Gy
Plan Total Fractions Prescribed: 10
Plan Total Prescribed Dose: 25 Gy
Reference Point Dosage Given to Date: 7.5 Gy
Reference Point Session Dosage Given: 2.5 Gy
Session Number: 3

## 2023-04-16 ENCOUNTER — Ambulatory Visit
Admission: RE | Admit: 2023-04-16 | Discharge: 2023-04-16 | Disposition: A | Discharge status: Home / Self Care | Payer: Medicare Other | Source: Ambulatory Visit | Attending: Radiation Oncology | Admitting: Radiation Oncology

## 2023-04-16 ENCOUNTER — Other Ambulatory Visit (HOSPITAL_COMMUNITY): Payer: Self-pay

## 2023-04-16 ENCOUNTER — Other Ambulatory Visit: Payer: Self-pay

## 2023-04-16 DIAGNOSIS — Z510 Encounter for antineoplastic radiation therapy: Secondary | ICD-10-CM | POA: Diagnosis not present

## 2023-04-16 LAB — RAD ONC ARIA SESSION SUMMARY
Course Elapsed Days: 5
Plan Fractions Treated to Date: 4
Plan Prescribed Dose Per Fraction: 2.5 Gy
Plan Total Fractions Prescribed: 10
Plan Total Prescribed Dose: 25 Gy
Reference Point Dosage Given to Date: 10 Gy
Reference Point Session Dosage Given: 2.5 Gy
Session Number: 4

## 2023-04-17 ENCOUNTER — Other Ambulatory Visit: Payer: Self-pay

## 2023-04-17 ENCOUNTER — Ambulatory Visit
Admission: RE | Admit: 2023-04-17 | Discharge: 2023-04-17 | Disposition: A | Discharge status: Home / Self Care | Payer: Medicare Other | Source: Ambulatory Visit | Attending: Radiation Oncology | Admitting: Radiation Oncology

## 2023-04-17 DIAGNOSIS — Z510 Encounter for antineoplastic radiation therapy: Secondary | ICD-10-CM | POA: Diagnosis not present

## 2023-04-17 LAB — RAD ONC ARIA SESSION SUMMARY
Course Elapsed Days: 6
Plan Fractions Treated to Date: 5
Plan Prescribed Dose Per Fraction: 2.5 Gy
Plan Total Fractions Prescribed: 10
Plan Total Prescribed Dose: 25 Gy
Reference Point Dosage Given to Date: 12.5 Gy
Reference Point Session Dosage Given: 2.5 Gy
Session Number: 5

## 2023-04-18 ENCOUNTER — Ambulatory Visit
Admission: RE | Admit: 2023-04-18 | Discharge: 2023-04-18 | Disposition: A | Discharge status: Home / Self Care | Payer: Medicare Other | Source: Ambulatory Visit | Attending: Radiation Oncology | Admitting: Radiation Oncology

## 2023-04-18 ENCOUNTER — Other Ambulatory Visit (HOSPITAL_COMMUNITY): Payer: Self-pay

## 2023-04-18 ENCOUNTER — Other Ambulatory Visit: Payer: Self-pay

## 2023-04-18 DIAGNOSIS — Z510 Encounter for antineoplastic radiation therapy: Secondary | ICD-10-CM | POA: Diagnosis not present

## 2023-04-18 LAB — RAD ONC ARIA SESSION SUMMARY
Course Elapsed Days: 7
Plan Fractions Treated to Date: 6
Plan Prescribed Dose Per Fraction: 2.5 Gy
Plan Total Fractions Prescribed: 10
Plan Total Prescribed Dose: 25 Gy
Reference Point Dosage Given to Date: 15 Gy
Reference Point Session Dosage Given: 2.5 Gy
Session Number: 6

## 2023-04-19 ENCOUNTER — Other Ambulatory Visit: Payer: Self-pay

## 2023-04-19 ENCOUNTER — Ambulatory Visit
Admission: RE | Admit: 2023-04-19 | Discharge: 2023-04-19 | Disposition: A | Discharge status: Home / Self Care | Payer: Medicare Other | Source: Ambulatory Visit | Attending: Radiation Oncology | Admitting: Radiation Oncology

## 2023-04-19 DIAGNOSIS — Z510 Encounter for antineoplastic radiation therapy: Secondary | ICD-10-CM | POA: Diagnosis not present

## 2023-04-19 LAB — RAD ONC ARIA SESSION SUMMARY
Course Elapsed Days: 8
Plan Fractions Treated to Date: 7
Plan Prescribed Dose Per Fraction: 2.5 Gy
Plan Total Fractions Prescribed: 10
Plan Total Prescribed Dose: 25 Gy
Reference Point Dosage Given to Date: 17.5 Gy
Reference Point Session Dosage Given: 2.5 Gy
Session Number: 7

## 2023-04-20 ENCOUNTER — Telehealth: Payer: Self-pay

## 2023-04-20 ENCOUNTER — Ambulatory Visit
Admission: RE | Admit: 2023-04-20 | Discharge: 2023-04-20 | Disposition: A | Discharge status: Home / Self Care | Payer: Medicare Other | Source: Ambulatory Visit | Attending: Radiation Oncology | Admitting: Radiation Oncology

## 2023-04-20 ENCOUNTER — Other Ambulatory Visit (HOSPITAL_COMMUNITY): Payer: Self-pay

## 2023-04-20 ENCOUNTER — Other Ambulatory Visit: Payer: Self-pay

## 2023-04-20 DIAGNOSIS — Z510 Encounter for antineoplastic radiation therapy: Secondary | ICD-10-CM | POA: Diagnosis not present

## 2023-04-20 LAB — RAD ONC ARIA SESSION SUMMARY
Course Elapsed Days: 9
Plan Fractions Treated to Date: 8
Plan Prescribed Dose Per Fraction: 2.5 Gy
Plan Total Fractions Prescribed: 10
Plan Total Prescribed Dose: 25 Gy
Reference Point Dosage Given to Date: 20 Gy
Reference Point Session Dosage Given: 2.5 Gy
Session Number: 8

## 2023-04-20 NOTE — Telephone Encounter (Signed)
Contacted pt's wife to let her know about appointment date and time change. Pt and wife agreeable and acknowledge change date and time.

## 2023-04-23 ENCOUNTER — Ambulatory Visit
Admission: RE | Admit: 2023-04-23 | Discharge: 2023-04-23 | Disposition: A | Discharge status: Home / Self Care | Payer: Medicare Other | Source: Ambulatory Visit | Attending: Radiation Oncology | Admitting: Radiation Oncology

## 2023-04-23 ENCOUNTER — Other Ambulatory Visit: Payer: Self-pay

## 2023-04-23 DIAGNOSIS — Z510 Encounter for antineoplastic radiation therapy: Secondary | ICD-10-CM | POA: Diagnosis not present

## 2023-04-23 LAB — RAD ONC ARIA SESSION SUMMARY
Course Elapsed Days: 12
Plan Fractions Treated to Date: 9
Plan Prescribed Dose Per Fraction: 2.5 Gy
Plan Total Fractions Prescribed: 10
Plan Total Prescribed Dose: 25 Gy
Reference Point Dosage Given to Date: 22.5 Gy
Reference Point Session Dosage Given: 2.5 Gy
Session Number: 9

## 2023-04-24 ENCOUNTER — Other Ambulatory Visit: Payer: Self-pay

## 2023-04-24 ENCOUNTER — Ambulatory Visit
Admission: RE | Admit: 2023-04-24 | Discharge: 2023-04-24 | Disposition: A | Discharge status: Home / Self Care | Payer: Medicare Other | Source: Ambulatory Visit | Attending: Radiation Oncology | Admitting: Radiation Oncology

## 2023-04-24 DIAGNOSIS — Z510 Encounter for antineoplastic radiation therapy: Secondary | ICD-10-CM | POA: Diagnosis not present

## 2023-04-24 LAB — RAD ONC ARIA SESSION SUMMARY
Course Elapsed Days: 13
Plan Fractions Treated to Date: 10
Plan Prescribed Dose Per Fraction: 2.5 Gy
Plan Total Fractions Prescribed: 10
Plan Total Prescribed Dose: 25 Gy
Reference Point Dosage Given to Date: 25 Gy
Reference Point Session Dosage Given: 2.5 Gy
Session Number: 10

## 2023-04-25 NOTE — Radiation Completion Notes (Signed)
Patient Name: LEHMAN, WHITELEY MRN: [id number] Date of Birth: [birth year] Referring Physician: Wyvonnia Lora, M.D. Date of Service: 2023-04-25 Radiation Oncologist: Lonie Peak, M.D. 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

## 2023-04-26 ENCOUNTER — Other Ambulatory Visit: Payer: Self-pay

## 2023-04-26 ENCOUNTER — Other Ambulatory Visit (HOSPITAL_COMMUNITY): Payer: Self-pay

## 2023-04-26 ENCOUNTER — Inpatient Hospital Stay (HOSPITAL_BASED_OUTPATIENT_CLINIC_OR_DEPARTMENT_OTHER): Payer: Medicare Other | Admitting: Hematology

## 2023-04-26 ENCOUNTER — Inpatient Hospital Stay: Payer: Medicare Other

## 2023-04-26 VITALS — BP 109/72 | HR 67 | Temp 98.4°F | Resp 18 | Ht 76.0 in | Wt 181.4 lb

## 2023-04-26 VITALS — BP 140/81 | HR 62 | Temp 97.8°F | Resp 16

## 2023-04-26 DIAGNOSIS — Z5111 Encounter for antineoplastic chemotherapy: Secondary | ICD-10-CM | POA: Diagnosis not present

## 2023-04-26 DIAGNOSIS — Z7189 Other specified counseling: Secondary | ICD-10-CM

## 2023-04-26 DIAGNOSIS — C8318 Mantle cell lymphoma, lymph nodes of multiple sites: Secondary | ICD-10-CM | POA: Diagnosis not present

## 2023-04-26 DIAGNOSIS — E883 Tumor lysis syndrome: Secondary | ICD-10-CM

## 2023-04-26 DIAGNOSIS — K1233 Oral mucositis (ulcerative) due to radiation: Secondary | ICD-10-CM | POA: Diagnosis not present

## 2023-04-26 DIAGNOSIS — Z5112 Encounter for antineoplastic immunotherapy: Secondary | ICD-10-CM | POA: Diagnosis not present

## 2023-04-26 LAB — CBC WITH DIFFERENTIAL (CANCER CENTER ONLY)
Abs Immature Granulocytes: 0.04 10*3/uL (ref 0.00–0.07)
Basophils Absolute: 0 10*3/uL (ref 0.0–0.1)
Basophils Relative: 1 %
Eosinophils Absolute: 0 10*3/uL (ref 0.0–0.5)
Eosinophils Relative: 1 %
HCT: 28.8 % — ABNORMAL LOW (ref 39.0–52.0)
Hemoglobin: 9.1 g/dL — ABNORMAL LOW (ref 13.0–17.0)
Immature Granulocytes: 1 %
Lymphocytes Relative: 26 %
Lymphs Abs: 0.9 10*3/uL (ref 0.7–4.0)
MCH: 27.1 pg (ref 26.0–34.0)
MCHC: 31.6 g/dL (ref 30.0–36.0)
MCV: 85.7 fL (ref 80.0–100.0)
Monocytes Absolute: 0.4 10*3/uL (ref 0.1–1.0)
Monocytes Relative: 13 %
Neutro Abs: 2 10*3/uL (ref 1.7–7.7)
Neutrophils Relative %: 58 %
Platelet Count: 113 10*3/uL — ABNORMAL LOW (ref 150–400)
RBC: 3.36 MIL/uL — ABNORMAL LOW (ref 4.22–5.81)
RDW: 14.7 % (ref 11.5–15.5)
WBC Count: 3.4 10*3/uL — ABNORMAL LOW (ref 4.0–10.5)
nRBC: 0 % (ref 0.0–0.2)

## 2023-04-26 MED ORDER — DIPHENHYDRAMINE HCL 25 MG PO CAPS
50.0000 mg | ORAL_CAPSULE | Freq: Once | ORAL | Status: AC
  Administered 2023-04-26: 50 mg via ORAL
  Filled 2023-04-26: qty 2

## 2023-04-26 MED ORDER — SODIUM CHLORIDE 0.9% FLUSH
3.0000 mL | Freq: Once | INTRAVENOUS | Status: AC | PRN
  Administered 2023-04-26: 10 mL

## 2023-04-26 MED ORDER — STERILE WATER FOR INJECTION IJ SOLN
5.0000 mL | Freq: Four times a day (QID) | OROMUCOSAL | 1 refills | Status: DC
  Filled 2023-04-26: qty 400, 20d supply, fill #0

## 2023-04-26 MED ORDER — HEPARIN SOD (PORK) LOCK FLUSH 100 UNIT/ML IV SOLN
500.0000 [IU] | Freq: Once | INTRAVENOUS | Status: AC | PRN
  Administered 2023-04-26: 500 [IU]

## 2023-04-26 MED ORDER — ACETAMINOPHEN 325 MG PO TABS
650.0000 mg | ORAL_TABLET | Freq: Once | ORAL | Status: AC
  Administered 2023-04-26: 650 mg via ORAL
  Filled 2023-04-26: qty 2

## 2023-04-26 MED ORDER — SODIUM CHLORIDE 0.9% FLUSH
10.0000 mL | Freq: Once | INTRAVENOUS | Status: AC | PRN
  Administered 2023-04-26: 10 mL

## 2023-04-26 MED ORDER — METHYLPREDNISOLONE SODIUM SUCC 125 MG IJ SOLR
125.0000 mg | Freq: Once | INTRAMUSCULAR | Status: AC
  Administered 2023-04-26: 125 mg via INTRAVENOUS
  Filled 2023-04-26: qty 2

## 2023-04-26 MED ORDER — FAMOTIDINE IN NACL 20-0.9 MG/50ML-% IV SOLN
20.0000 mg | Freq: Once | INTRAVENOUS | Status: AC
  Administered 2023-04-26: 20 mg via INTRAVENOUS
  Filled 2023-04-26: qty 50

## 2023-04-26 MED ORDER — MAGIC MOUTHWASH W/LIDOCAINE: 5.0000 mL | Freq: Four times a day (QID) | ORAL | 1 refills | Status: DC | PRN

## 2023-04-26 MED ORDER — SODIUM CHLORIDE 0.9 % IV SOLN: Freq: Once | INTRAVENOUS | Status: AC

## 2023-04-26 MED ORDER — ALTEPLASE 2 MG IJ SOLR: 2.0000 mg | Freq: Once | INTRAMUSCULAR | Status: AC | PRN

## 2023-04-26 MED ORDER — HEPARIN SOD (PORK) LOCK FLUSH 100 UNIT/ML IV SOLN: 250.0000 [IU] | Freq: Once | INTRAVENOUS | Status: DC | PRN

## 2023-04-26 MED ORDER — SODIUM CHLORIDE 0.9 % IV SOLN
375.0000 mg/m2 | Freq: Once | INTRAVENOUS | Status: AC
  Administered 2023-04-26: 800 mg via INTRAVENOUS
  Filled 2023-04-26: qty 50

## 2023-04-26 MED ORDER — ALTEPLASE 2 MG IJ SOLR
2.0000 mg | Freq: Once | INTRAMUSCULAR | Status: AC
  Administered 2023-04-26: 2 mg
  Filled 2023-04-26: qty 2

## 2023-04-26 NOTE — Addendum Note (Signed)
Addended by: Vanessa Kick on: 04/26/2023 12:57 PM   Modules accepted: Orders

## 2023-04-26 NOTE — Progress Notes (Signed)
HEMATOLOGY/ONCOLOGY CLINIC VISIT NOTE  Date of Service: 04/26/23  Patient Care Team: [facility] as PCP - General (General Practice)  CHIEF COMPLAINTS/PURPOSE OF CONSULTATION:  Follow-up for continued evaluation and management of relapsed mantle cell lymphoma  HISTORY OF PRESENTING ILLNESS:  Please see previous note for details of initial presentation  INTERVAL HISTORY  Jerry Tran is here for continued evaluation and management of his relapsed mantle cell lymphoma.   Patient was last seen by me on 03/23/2023 and complained of worsened throat fullness with associated swallowing issues, choking, difficulty breathing through nose, and nasally voice.   Today, he is accompanied by his wife. He reports that he completed his radiation therapy yesterday and has tolerated it well with no toxicities. His voice has become more clear.   He complains of throat irritation which does affect his swallowing and food consumption. Patient does regularly gargle warm water with salt but no baking soda.   Patient has been tolerating Ibrutinib well with no toxicities. He denies any vomiting, nausea, diarrhea, abdominal pain, bleeding, bruising, new leg swelling, SOB, chest pain, fever, chills, or night sweats. He denies any pain with his port placement or pain along the spine.   He reports a lump on his inner thigh which is not persistent. Patient denies any other bumps anywhere else.  Patient complains of lack of taste and notes fluctuating weight. He continues to eat as much as possible and stays regularly hydrated.   MEDICAL HISTORY:  Past Medical History:  Diagnosis Date   Arthritis    Chronic kidney disease, stage 3b (HCC) 12/09/2021   Dyslipidemia    GERD (gastroesophageal reflux disease)    Hypertension    PAF (paroxysmal atrial fibrillation) (HCC)    Prostate cancer (HCC) 03/2021   s/p radiation therapy   PTSD (post-traumatic stress disorder)     SURGICAL HISTORY: Past  Surgical History:  Procedure Laterality Date   IR IMAGING GUIDED PORT INSERTION  11/06/2022   LIPOMA EXCISION Right 12/09/2021   Procedure: RIGHT SUPERCLAVICULAR LYMPH NODE EXCISION;  Surgeon: Andria Meuse, MD;  Location: [facility] OR;  Service: General;  Laterality: Right;    SOCIAL HISTORY: Social History   Socioeconomic History   Marital status: Married    Spouse name: Not on file   Number of children: Not on file   Years of education: Not on file   Highest education level: Not on file  Occupational History   Occupation: retired  Tobacco Use   Smoking status: Former    Packs/day: 1.00    Years: 15.00    Additional pack years: 0.00    Total pack years: 15.00    Types: Cigarettes   Smokeless tobacco: Never  Substance and Sexual Activity   Alcohol use: Not Currently    Comment: h/o heavy use   Drug use: Not Currently    Types: Marijuana, Cocaine    Comment: remote   Sexual activity: Not on file  Other Topics Concern   Not on file  Social History Narrative   Not on file   Social Determinants of Health   Financial Resource Strain: Not on file  Food Insecurity: No Food Insecurity (04/03/2023)   Hunger Vital Sign    Worried About Running Out of Food in the Last Year: Never true    Ran Out of Food in the Last Year: Never true  Transportation Needs: No Transportation Needs (04/03/2023)   PRAPARE - Administrator, Civil Service (Medical): No  Lack of Transportation (Non-Medical): No  Physical Activity: Not on file  Stress: Not on file  Social Connections: Not on file  Intimate Partner Violence: Not At Risk (04/03/2023)   Humiliation, Afraid, Rape, and Kick questionnaire    Fear of Current or Ex-Partner: No    Emotionally Abused: No    Physically Abused: No    Sexually Abused: No    FAMILY HISTORY: Family History  Problem Relation Age of Onset   Cancer Neg Hx     ALLERGIES:  is allergic to sildenafil.  MEDICATIONS:  Current Outpatient Medications   Medication Sig Dispense Refill   ammonium lactate (LAC-HYDRIN) 12 % lotion Apply 1 application topically 2 (two) times daily as needed for dry skin.     atorvastatin (LIPITOR) 20 MG tablet Take 10 mg by mouth at bedtime.     Cholecalciferol (VITAMIN D3 PO) Take 1 tablet by mouth every morning.     diclofenac Sodium (VOLTAREN) 1 % GEL Apply 2 g topically 4 (four) times daily as needed (pain).     feeding supplement (ENSURE ENLIVE / ENSURE PLUS) LIQD Take 237 mLs by mouth 2 (two) times daily between meals. 237 mL 12   flecainide (TAMBOCOR) 100 MG tablet Take 100 mg by mouth every 12 (twelve) hours.     fluticasone (FLONASE) 50 MCG/ACT nasal spray Place 1 spray into both nostrils daily as needed for allergies or rhinitis.     furosemide (LASIX) 20 MG tablet Take 20 mg by mouth.     hydrocortisone (ANUSOL-HC) 25 MG suppository Place 1 suppository (25 mg total) rectally 2 (two) times daily. 12 suppository 0   hydroxypropyl methylcellulose / hypromellose (ISOPTO TEARS / GONIOVISC) 2.5 % ophthalmic solution Place 2 drops into both eyes 2 (two) times daily as needed for dry eyes.     ibrutinib (IMBRUVICA) 420 MG tablet Take 1 tablet (420 mg total) by mouth daily. Take with a glass of water. 28 tablet 0   LORazepam (ATIVAN) 0.5 MG tablet Take 1 tablet (0.5 mg total) by mouth every 6 (six) hours as needed (Nausea or vomiting). 30 tablet 0   metoprolol tartrate (LOPRESSOR) 25 MG tablet Take 12.5 mg by mouth 2 (two) times daily. Take with flecainide     mirtazapine (REMERON) 30 MG tablet Take 30 mg by mouth at bedtime.     nystatin (MYCOSTATIN) 100000 UNIT/ML suspension Take 5 mLs (500,000 Units total) by mouth 4 (four) times daily. 200 mL 2   omeprazole (PRILOSEC) 20 MG capsule Take 20 mg by mouth daily before breakfast.     potassium chloride SA (KLOR-CON M) 20 MEQ tablet TAKE 1 TABLET(20 MEQ) BY MOUTH TWICE DAILY 180 tablet 2   prazosin (MINIPRESS) 2 MG capsule Take 6 mg by mouth at bedtime.      terbinafine (LAMISIL) 1 % cream Apply 1 application topically 2 (two) times daily as needed (athlete's foot).     torsemide (DEMADEX) 10 MG tablet Take 20 mg by mouth daily.     triamcinolone cream (KENALOG) 0.1 % Apply 1 application topically 2 (two) times daily as needed (rash).     No current facility-administered medications for this visit.    REVIEW OF SYSTEMS:    10 Point review of Systems was done is negative except as noted above.   PHYSICAL EXAMINATION: .BP 109/72 (BP Location: Left Arm, Patient Position: Sitting)   Pulse 67   Temp 98.4 F (36.9 C) (Oral)   Resp 18   Ht 6\' 4"  (1.93  m)   Wt 181 lb 6.4 oz (82.3 kg)   SpO2 100%   BMI 22.08 kg/m   GENERAL:alert, in no acute distress and comfortable SKIN: no acute rashes, no significant lesions EYES: conjunctiva are pink and non-injected, sclera anicteric OROPHARYNX: MMM, no exudates, no oropharyngeal erythema or ulceration NECK: supple, no JVD LYMPH:  no palpable lymphadenopathy in the cervical, axillary or inguinal regions LUNGS: clear to auscultation b/l with normal respiratory effort HEART: regular rate & rhythm ABDOMEN:  normoactive bowel sounds , non tender, not distended. Extremity: no pedal edema PSYCH: alert & oriented x 3 with fluent speech NEURO: no focal motor/sensory deficits   LABORATORY DATA:  I have reviewed the data as listed     Latest Ref Rng & Units 04/26/2023   11:11 AM 04/06/2023   12:09 PM 03/23/2023    3:13 PM  CBC  WBC 4.0 - 10.5 K/uL 3.4  4.4  5.3   Hemoglobin 13.0 - 17.0 g/dL 9.1  9.7  9.8   Hematocrit 39.0 - 52.0 % 28.8  30.0  31.2   Platelets 150 - 400 K/uL 113  145  197       Latest Ref Rng & Units 04/06/2023   12:09 PM 03/23/2023    3:13 PM 02/20/2023   10:32 AM  CMP  Glucose 70 - 99 mg/dL 97  85  409   BUN 8 - 23 mg/dL 36  28  28   Creatinine 0.61 - 1.24 mg/dL 8.11  9.14  7.82   Sodium 135 - 145 mmol/L 134  141  139   Potassium 3.5 - 5.1 mmol/L 4.2  4.4  4.1   Chloride 98 - 111  mmol/L 103  107  107   CO2 22 - 32 mmol/L 25  29  26    Calcium 8.9 - 10.3 mg/dL 9.4  9.6  9.7   Total Protein 6.5 - 8.1 g/dL 7.1  6.9  6.9   Total Bilirubin 0.3 - 1.2 mg/dL 0.3  0.3  0.4   Alkaline Phos 38 - 126 U/L 64  66  61   AST 15 - 41 U/L 15  14  15    ALT 0 - 44 U/L 8  7  10     Lab Results  Component Value Date   LDH 201 (H) 04/06/2023    Surgical Pathology 10/16/2022: A. 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    RADIOGRAPHIC STUDIES: I have personally reviewed the radiological images as listed and agreed with the findings in the report.  No results found.   ASSESSMENT & PLAN:   77 y.o.  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  3) moderate to severe protein calorie malnutrition-He has lost 8lbs but is drinking x2-3  Ensure drinks per week.  Wt Readings from Last 3 Encounters:  04/26/23 181 lb 6.4 oz (82.3 kg)  04/03/23 180 lb 3.2 oz (81.7 kg)  04/01/23 175 lb (79.4 kg)   4) Loose stools- He is having a few small, loose stools per day.  5) BLE swelling- resolved with compression socks.  6) Hemorrhoids- He reports  pain with his hemorrhoid flare but denies any rectal bleeding. He has not seen GI or surgeon for this problem, but [facility]   7) Hoarse voice- He reports more difficulty projecting his voice. Denies any noticeable reflux but he does take acid suppression medication. He denies any significant seasonal allergies.  PLAN:  -Discussed lab results on 04/26/2023 in detail with patient. CBC showed WBC of 3.4K, hemoglobin of 9.1, and platelets of 113K. -Patient does have some anemia -platelets slightly low, though not concerning at this time -continue Ibrutinib 420mg  at this time, may consider full dose at 560mg  down the line -will order PET scan in two months after completion of radiation therapy -recommend patient to gargle 1/2 L of warm water with 1 tsp of salt and 1 tsp of baking soda at least 4-5 times day to support throat healing -recommend to use magic mouth wash with lidocaine and soothing medications to improve throat issues as needed -recommend OTC lozenges to address throat issues -discussed option to connect patient with an academic physician at [facility] to consider options of other therapies if patient were to progress down the line, such as CAR T cell therapies, which patient is agreeable to connect with -answered all of patient's and his wife's questions in detail regarding effects of treatment -discussed goal to control cancer as much as possible though treatment would not be curative  FOLLOW-UP: RTC with Dr Candise Che with labs in 1 months Maintenance Rituxan per integrated scheduling  The total time spent in the appointment was 30 minutes* .  All of the patient's questions were answered with apparent satisfaction. The patient knows to call the clinic with any problems, questions or concerns.   Wyvonnia Lora MD MS AAHIVMS [facility] Hematology/Oncology Physician [facility]  .*Total Encounter Time as defined by the Centers for  Medicare and Medicaid Services includes, in addition to the face-to-face time of a patient visit (documented in the note above) non-face-to-face time: obtaining and reviewing outside history, ordering and reviewing medications, tests or procedures, care coordination (communications with other health care professionals or caregivers) and documentation in the medical record.    I,Jerry Tran,acting as a Neurosurgeon for Wyvonnia Lora, MD.,have documented all relevant documentation on the behalf of Wyvonnia Lora, MD,as directed by  Wyvonnia Lora, MD while in the presence of Wyvonnia Lora, MD.  .I have reviewed the above documentation for accuracy and completeness, and I agree with the above. [name] MD

## 2023-04-26 NOTE — Patient Instructions (Signed)
[place] [facility]   Discharge Instructions: Thank you for choosing [facility] to provide your oncology and hematology care.   If you have a lab appointment with the Cancer Center, please go directly to the Cancer Center and check in at the registration area.   Wear comfortable clothing and clothing appropriate for easy access to any Portacath or PICC line.   We strive to give you quality time with your provider. You may need to reschedule your appointment if you arrive late (15 or more minutes).  Arriving late affects you and other patients whose appointments are after yours.  Also, if you miss three or more appointments without notifying the office, you may be dismissed from the clinic at the provider's discretion.      For prescription refill requests, have your pharmacy contact our office and allow 72 hours for refills to be completed.    Today you received the following chemotherapy and/or immunotherapy agents: Rituximab (Rituxan)      To help prevent nausea and vomiting after your treatment, we encourage you to take your nausea medication as directed.  BELOW ARE SYMPTOMS THAT SHOULD BE REPORTED IMMEDIATELY: *FEVER GREATER THAN 100.4 F (38 C) OR HIGHER *CHILLS OR SWEATING *NAUSEA AND VOMITING THAT IS NOT CONTROLLED WITH YOUR NAUSEA MEDICATION *UNUSUAL SHORTNESS OF BREATH *UNUSUAL BRUISING OR BLEEDING *URINARY PROBLEMS (pain or burning when urinating, or frequent urination) *BOWEL PROBLEMS (unusual diarrhea, constipation, pain near the anus) TENDERNESS IN MOUTH AND THROAT WITH OR WITHOUT PRESENCE OF ULCERS (sore throat, sores in mouth, or a toothache) UNUSUAL RASH, SWELLING OR PAIN  UNUSUAL VAGINAL DISCHARGE OR ITCHING   Items with * indicate a potential emergency and should be followed up as soon as possible or go to the Emergency Department if any problems should occur.  Please show the CHEMOTHERAPY ALERT CARD or IMMUNOTHERAPY ALERT  CARD at check-in to the Emergency Department and triage nurse.  Should you have questions after your visit or need to cancel or reschedule your appointment, please contact [facility]  Dept: [phone]  and follow the prompts.  Office hours are 8:00 a.m. to 4:30 p.m. Monday - Friday. Please note that voicemails left after 4:00 p.m. may not be returned until the following business day.  We are closed weekends and major holidays. You have access to a nurse at all times for urgent questions. Please call the main number to the clinic Dept: [phone] and follow the prompts.   For any non-urgent questions, you may also contact your provider using MyChart. We now offer e-Visits for anyone 25 and older to request care online for non-urgent symptoms. For details visit [email or web address].   Also download the MyChart app! Go to the app store, search "MyChart", open the app, select , and log in with your MyChart username and password.

## 2023-04-27 ENCOUNTER — Ambulatory Visit: Payer: Medicare Other

## 2023-04-27 ENCOUNTER — Ambulatory Visit: Payer: Medicare Other | Admitting: Hematology

## 2023-04-27 ENCOUNTER — Other Ambulatory Visit: Payer: Medicare Other

## 2023-04-28 ENCOUNTER — Other Ambulatory Visit (HOSPITAL_COMMUNITY): Payer: Self-pay

## 2023-04-30 ENCOUNTER — Inpatient Hospital Stay: Payer: Medicare Other | Attending: Hematology and Oncology

## 2023-04-30 ENCOUNTER — Inpatient Hospital Stay (HOSPITAL_BASED_OUTPATIENT_CLINIC_OR_DEPARTMENT_OTHER): Payer: Medicare Other | Admitting: Physician Assistant

## 2023-04-30 ENCOUNTER — Other Ambulatory Visit (HOSPITAL_COMMUNITY): Payer: Self-pay

## 2023-04-30 VITALS — BP 104/72 | HR 77 | Temp 98.3°F | Resp 16 | Wt 173.5 lb

## 2023-04-30 DIAGNOSIS — C8318 Mantle cell lymphoma, lymph nodes of multiple sites: Secondary | ICD-10-CM | POA: Diagnosis present

## 2023-04-30 DIAGNOSIS — J029 Acute pharyngitis, unspecified: Secondary | ICD-10-CM | POA: Diagnosis not present

## 2023-04-30 DIAGNOSIS — R638 Other symptoms and signs concerning food and fluid intake: Secondary | ICD-10-CM | POA: Diagnosis not present

## 2023-04-30 DIAGNOSIS — Z87891 Personal history of nicotine dependence: Secondary | ICD-10-CM | POA: Diagnosis not present

## 2023-04-30 DIAGNOSIS — E860 Dehydration: Secondary | ICD-10-CM | POA: Diagnosis present

## 2023-04-30 DIAGNOSIS — E883 Tumor lysis syndrome: Secondary | ICD-10-CM

## 2023-04-30 LAB — CMP (CANCER CENTER ONLY)
ALT: 7 U/L (ref 0–44)
AST: 13 U/L — ABNORMAL LOW (ref 15–41)
Albumin: 3.4 g/dL — ABNORMAL LOW (ref 3.5–5.0)
Alkaline Phosphatase: 55 U/L (ref 38–126)
Anion gap: 7 (ref 5–15)
BUN: 14 mg/dL (ref 8–23)
CO2: 29 mmol/L (ref 22–32)
Calcium: 9 mg/dL (ref 8.9–10.3)
Chloride: 101 mmol/L (ref 98–111)
Creatinine: 1.22 mg/dL (ref 0.61–1.24)
GFR, Estimated: >60 mL/min (ref >60)
Glucose, Bld: 88 mg/dL (ref 70–99)
Potassium: 3.5 mmol/L (ref 3.5–5.1)
Sodium: 137 mmol/L (ref 135–145)
Total Bilirubin: 0.5 mg/dL (ref 0.3–1.2)
Total Protein: 7 g/dL (ref 6.5–8.1)

## 2023-04-30 LAB — CBC WITH DIFFERENTIAL (CANCER CENTER ONLY)
Abs Immature Granulocytes: 0.08 10*3/uL — ABNORMAL HIGH (ref 0.00–0.07)
Basophils Absolute: 0 10*3/uL (ref 0.0–0.1)
Basophils Relative: 1 %
Eosinophils Absolute: 0 10*3/uL (ref 0.0–0.5)
Eosinophils Relative: 0 %
HCT: 31.6 % — ABNORMAL LOW (ref 39.0–52.0)
Hemoglobin: 10.4 g/dL — ABNORMAL LOW (ref 13.0–17.0)
Immature Granulocytes: 2 %
Lymphocytes Relative: 18 %
Lymphs Abs: 0.7 10*3/uL (ref 0.7–4.0)
MCH: 27.8 pg (ref 26.0–34.0)
MCHC: 32.9 g/dL (ref 30.0–36.0)
MCV: 84.5 fL (ref 80.0–100.0)
Monocytes Absolute: 0.6 10*3/uL (ref 0.1–1.0)
Monocytes Relative: 15 %
Neutro Abs: 2.7 10*3/uL (ref 1.7–7.7)
Neutrophils Relative %: 64 %
Platelet Count: 132 10*3/uL — ABNORMAL LOW (ref 150–400)
RBC: 3.74 MIL/uL — ABNORMAL LOW (ref 4.22–5.81)
RDW: 14.5 % (ref 11.5–15.5)
WBC Count: 4.1 10*3/uL (ref 4.0–10.5)
nRBC: 0 % (ref 0.0–0.2)

## 2023-04-30 LAB — MAGNESIUM: Magnesium: 1.5 mg/dL — ABNORMAL LOW (ref 1.7–2.4)

## 2023-04-30 MED ORDER — SODIUM CHLORIDE 0.9 % IV SOLN: Freq: Once | INTRAVENOUS | Status: AC

## 2023-04-30 MED ORDER — HEPARIN SOD (PORK) LOCK FLUSH 100 UNIT/ML IV SOLN
500.0000 [IU] | Freq: Once | INTRAVENOUS | Status: AC | PRN
  Administered 2023-04-30: 500 [IU]

## 2023-04-30 MED ORDER — SODIUM CHLORIDE 0.9% FLUSH
10.0000 mL | Freq: Once | INTRAVENOUS | Status: AC | PRN
  Administered 2023-04-30: 10 mL

## 2023-04-30 MED ORDER — SUCRALFATE 1 GM/10ML PO SUSP
1.0000 g | Freq: Three times a day (TID) | ORAL | 0 refills | Status: DC
  Filled 2023-04-30: qty 420, 11d supply, fill #0

## 2023-04-30 MED ORDER — MAGNESIUM SULFATE 2 GM/50ML IV SOLN
2.0000 g | Freq: Once | INTRAVENOUS | Status: AC
  Administered 2023-04-30: 2 g via INTRAVENOUS
  Filled 2023-04-30: qty 50

## 2023-04-30 NOTE — Progress Notes (Signed)
Symptom Management Consult Note [facility]    Patient Care Team: Center, [facility] as PCP - General (General Practice)    Name / MRN / DOB: Jerry Tran  [id number]  August 16, 1946   Date of visit: 04/30/2023   Chief Complaint/Reason for visit: throat pain   Current Therapy: Rituxumab-abbs  Last treatment:  Day 1   Cycle 1 on 04/26/23   ASSESSMENT & PLAN: Patient is a 77 y.o. male  with oncologic history of relapsed mantle cell lymphoma followed by Dr. Candise Che.  I have viewed most recent oncology note and lab work.    #Relapsed mantle cell lymphoma  -Next appointment with oncologist is 06/29/23.  #Sore throat -Exam without obvious mucositis. -Discussed symptom management and sent prescription for Carafate suspension. -Patient with signs of early thrush. Already has refill for Nystatin suspension. -Patient received a liter of normal saline in clinic for hydration support.  BP soft on arrival 97/63.  Rechecked after fluids and BP is 104/72 which is closer to his baseline. -Patient will return for IV fluids on Wednesday and Friday of this week for supportive care.  #Hypomagnesemia -Magnesium today is 1.5.  Will replete with 2 g IV magnesium.   Strict ED precautions discussed should symptoms worsen.     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)  12/16/2021 Initial Diagnosis   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)   12/23/2021 - 04/28/2022 Chemotherapy   Patient is on Treatment Plan : NON-HODGKINS LYMPHOMA Rituximab D1 + Bendamustine D1,2 q28d x 6 cycles     10/03/2022 - 10/03/2022 Chemotherapy   Patient is on Treatment Plan : NON-HODGKINS LYMPHOMA Rituximab q60d Maintenance     10/27/2022 - 02/22/2023 Chemotherapy   Patient is on Treatment Plan : NON-HODGKINS LYMPHOMA R-CHOP q21d     04/26/2023 -  Chemotherapy   Patient is on Treatment Plan : NON-HODGKINS LYMPHOMA  Rituximab q60d Maintenance     /    Interval history-: Jerry Tran is a 77 y.o. male with oncologic history as above presenting to [facility] today with chief complaint of sore throat. He is accompanied by his spouse.   Patient states he has had difficulty swallowing x 4 days.  He noticed it after finishing radiation.  He underwent 10 rounds of radiation and overall tolerated them well until now.  He describes the pain as sharp and burning.  He rates the pain 5 out of 10 in severity.  He has not taken any over-the-counter medications for his symptoms.  He is able to tolerate liquid intake although has difficulty with solid food.  He has been drinking 1 Ensure per day.  He is coughing up clear phlegm, denies any hemoptysis.  Yesterday he drank 20 ounces of fluids and ate no solid food.  He denies any fever or chills.  No sick contacts. He had thrush recently that resolved with Nystatin.  Patient has Magic mouthwash at home as well.      ROS  All other systems are reviewed and are negative for acute change except as noted in the HPI.    Allergies  Allergen Reactions   Sildenafil Other (See Comments)    Unknown reaction - reported by [facility]     Past Medical History:  Diagnosis Date   Arthritis    Chronic kidney disease, stage 3b (HCC) 12/09/2021   Dyslipidemia    GERD (gastroesophageal reflux disease)  Hypertension    PAF (paroxysmal atrial fibrillation) (HCC)    Prostate cancer (HCC) 03/2021   s/p radiation therapy   PTSD (post-traumatic stress disorder)      Past Surgical History:  Procedure Laterality Date   IR IMAGING GUIDED PORT INSERTION  11/06/2022   LIPOMA EXCISION Right 12/09/2021   Procedure: RIGHT SUPERCLAVICULAR LYMPH NODE EXCISION;  Surgeon: Andria Meuse, MD;  Location: [facility] OR;  Service: General;  Laterality: Right;    Social History   Socioeconomic History   Marital status: Married    Spouse name: Not on file   Number of children: Not on file   Years of  education: Not on file   Highest education level: Not on file  Occupational History   Occupation: retired  Tobacco Use   Smoking status: Former    Packs/day: 1.00    Years: 15.00    Additional pack years: 0.00    Total pack years: 15.00    Types: Cigarettes   Smokeless tobacco: Never  Substance and Sexual Activity   Alcohol use: Not Currently    Comment: h/o heavy use   Drug use: Not Currently    Types: Marijuana, Cocaine    Comment: remote   Sexual activity: Not on file  Other Topics Concern   Not on file  Social History Narrative   Not on file   Social Determinants of Health   Financial Resource Strain: Not on file  Food Insecurity: No Food Insecurity (04/03/2023)   Hunger Vital Sign    Worried About Running Out of Food in the Last Year: Never true    Ran Out of Food in the Last Year: Never true  Transportation Needs: No Transportation Needs (04/03/2023)   PRAPARE - Administrator, Civil Service (Medical): No    Lack of Transportation (Non-Medical): No  Physical Activity: Not on file  Stress: Not on file  Social Connections: Not on file  Intimate Partner Violence: Not At Risk (04/03/2023)   Humiliation, Afraid, Rape, and Kick questionnaire    Fear of Current or Ex-Partner: No    Emotionally Abused: No    Physically Abused: No    Sexually Abused: No    Family History  Problem Relation Age of Onset   Cancer Neg Hx      Current Outpatient Medications:    sucralfate (CARAFATE) 1 GM/10ML suspension, Take 10 mLs by mouth 4 times daily -  with meals and at bedtime., Disp: 420 mL, Rfl: 0   ammonium lactate (LAC-HYDRIN) 12 % lotion, Apply 1 application topically 2 (two) times daily as needed for dry skin., Disp: , Rfl:    atorvastatin (LIPITOR) 20 MG tablet, Take 10 mg by mouth at bedtime., Disp: , Rfl:    Cholecalciferol (VITAMIN D3 PO), Take 1 tablet by mouth every morning., Disp: , Rfl:    diclofenac Sodium (VOLTAREN) 1 % GEL, Apply 2 g topically 4 (four)  times daily as needed (pain)., Disp: , Rfl:    feeding supplement (ENSURE ENLIVE / ENSURE PLUS) LIQD, Take 237 mLs by mouth 2 (two) times daily between meals., Disp: 237 mL, Rfl: 12   flecainide (TAMBOCOR) 100 MG tablet, Take 100 mg by mouth every 12 (twelve) hours., Disp: , Rfl:    fluticasone (FLONASE) 50 MCG/ACT nasal spray, Place 1 spray into both nostrils daily as needed for allergies or rhinitis., Disp: , Rfl:    furosemide (LASIX) 20 MG tablet, Take 20 mg by mouth., Disp: , Rfl:  hydrocortisone (ANUSOL-HC) 25 MG suppository, Place 1 suppository (25 mg total) rectally 2 (two) times daily., Disp: 12 suppository, Rfl: 0   hydroxypropyl methylcellulose / hypromellose (ISOPTO TEARS / GONIOVISC) 2.5 % ophthalmic solution, Place 2 drops into both eyes 2 (two) times daily as needed for dry eyes., Disp: , Rfl:    ibrutinib (IMBRUVICA) 420 MG tablet, Take 1 tablet (420 mg total) by mouth daily. Take with a glass of water., Disp: 28 tablet, Rfl: 0   LORazepam (ATIVAN) 0.5 MG tablet, Take 1 tablet (0.5 mg total) by mouth every 6 (six) hours as needed (Nausea or vomiting)., Disp: 30 tablet, Rfl: 0   magic mouthwash (multi-ingredient) oral suspension, Take 5 mLs by mouth 4 times daily as needed for mouth and throat pain., Disp: 400 mL, Rfl: 1   magic mouthwash w/lidocaine SOLN, Take 5 mLs by mouth 4 (four) times daily as needed for mouth pain (mouth and throat pain related to radiation). Compound 400 ml total volume with  80ml of Maalox 80ml of Nystatin 100,000 unit/ml 80ml of 2% viscous lidocaine 80ml of Diphenhydramine 12.5mg /44ml 80ml of Distilled water., Disp: 400 mL, Rfl: 1   metoprolol tartrate (LOPRESSOR) 25 MG tablet, Take 12.5 mg by mouth 2 (two) times daily. Take with flecainide, Disp: , Rfl:    mirtazapine (REMERON) 30 MG tablet, Take 30 mg by mouth at bedtime., Disp: , Rfl:    nystatin (MYCOSTATIN) 100000 UNIT/ML suspension, Take 5 mLs (500,000 Units total) by mouth 4 (four) times daily., Disp:  200 mL, Rfl: 2   omeprazole (PRILOSEC) 20 MG capsule, Take 20 mg by mouth daily before breakfast., Disp: , Rfl:    potassium chloride SA (KLOR-CON M) 20 MEQ tablet, TAKE 1 TABLET(20 MEQ) BY MOUTH TWICE DAILY, Disp: 180 tablet, Rfl: 2   prazosin (MINIPRESS) 2 MG capsule, Take 6 mg by mouth at bedtime., Disp: , Rfl:    terbinafine (LAMISIL) 1 % cream, Apply 1 application topically 2 (two) times daily as needed (athlete's foot)., Disp: , Rfl:    torsemide (DEMADEX) 10 MG tablet, Take 20 mg by mouth daily., Disp: , Rfl:    triamcinolone cream (KENALOG) 0.1 %, Apply 1 application topically 2 (two) times daily as needed (rash)., Disp: , Rfl:   PHYSICAL EXAM: ECOG FS:1 - Symptomatic but completely ambulatory    Vitals:   04/30/23 1314 04/30/23 1548  BP: 97/63 104/72  Pulse: 84 77  Resp: 16 16  Temp: 98.3 F (36.8 C)   TempSrc: Oral   SpO2: 98% 100%  Weight: 173 lb 8 oz (78.7 kg)    Physical Exam Vitals and nursing note reviewed.  Constitutional:      Appearance: He is not ill-appearing or toxic-appearing.  HENT:     Head: Normocephalic.  Eyes:     Conjunctiva/sclera: Conjunctivae normal.  Cardiovascular:     Rate and Rhythm: Normal rate and regular rhythm.     Pulses: Normal pulses.     Heart sounds: Normal heart sounds.  Pulmonary:     Effort: Pulmonary effort is normal.     Breath sounds: Normal breath sounds.  Abdominal:     General: There is no distension.  Musculoskeletal:     Cervical back: Normal range of motion.  Skin:    General: Skin is warm and dry.  Neurological:     Mental Status: He is alert.        LABORATORY DATA: I have reviewed the data as listed    Latest Ref Rng &  Units 04/30/2023    1:29 PM 04/26/2023   11:11 AM 04/06/2023   12:09 PM  CBC  WBC 4.0 - 10.5 K/uL 4.1  3.4  4.4   Hemoglobin 13.0 - 17.0 g/dL 16.1  9.1  9.7   Hematocrit 39.0 - 52.0 % 31.6  28.8  30.0   Platelets 150 - 400 K/uL 132  113  145         Latest Ref Rng & Units 04/30/2023     1:29 PM 04/06/2023   12:09 PM 03/23/2023    3:13 PM  CMP  Glucose 70 - 99 mg/dL 88  97  85   BUN 8 - 23 mg/dL 14  36  28   Creatinine 0.61 - 1.24 mg/dL 0.96  0.45  4.09   Sodium 135 - 145 mmol/L 137  134  141   Potassium 3.5 - 5.1 mmol/L 3.5  4.2  4.4   Chloride 98 - 111 mmol/L 101  103  107   CO2 22 - 32 mmol/L 29  25  29    Calcium 8.9 - 10.3 mg/dL 9.0  9.4  9.6   Total Protein 6.5 - 8.1 g/dL 7.0  7.1  6.9   Total Bilirubin 0.3 - 1.2 mg/dL 0.5  0.3  0.3   Alkaline Phos 38 - 126 U/L 55  64  66   AST 15 - 41 U/L 13  15  14    ALT 0 - 44 U/L 7  8  7         RADIOGRAPHIC STUDIES (from last 24 hours if applicable) I have personally reviewed the radiological images as listed and agreed with the findings in the report. No results found.      Visit Diagnosis: 1. Sore throat   2. Hypomagnesemia   3. Poor fluid intake   4. Mantle cell lymphoma of lymph nodes of multiple regions [facility])      Orders Placed This Encounter  Procedures   CBC with Differential (Cancer Center Only)    Standing Status:   Future    Number of Occurrences:   1    Standing Expiration Date:   04/29/2024   CMP (Cancer Center only)    Standing Status:   Future    Number of Occurrences:   1    Standing Expiration Date:   04/29/2024   Magnesium    Standing Status:   Future    Number of Occurrences:   1    Standing Expiration Date:   04/29/2024    All questions were answered. The patient knows to call the clinic with any problems, questions or concerns. No barriers to learning was detected.  A total of more than 30 minutes were spent on this encounter with face-to-face time and non-face-to-face time, including preparing to see the patient, ordering tests and/or medications, counseling the patient and coordination of care as outlined above.    Thank you for allowing me to participate in the care of this patient.    Shanon Ace, PA-C Department of Hematology/Oncology [facility] at  [facility] Phone: [phone]  [phone]    04/30/2023 4:00 PM

## 2023-05-01 ENCOUNTER — Other Ambulatory Visit: Payer: Self-pay

## 2023-05-02 ENCOUNTER — Other Ambulatory Visit: Payer: Self-pay

## 2023-05-02 ENCOUNTER — Inpatient Hospital Stay: Payer: Medicare Other

## 2023-05-02 VITALS — BP 118/67 | HR 78 | Temp 98.1°F | Resp 16

## 2023-05-02 DIAGNOSIS — C8318 Mantle cell lymphoma, lymph nodes of multiple sites: Secondary | ICD-10-CM

## 2023-05-02 DIAGNOSIS — E883 Tumor lysis syndrome: Secondary | ICD-10-CM

## 2023-05-02 MED ORDER — SODIUM CHLORIDE 0.9% FLUSH
10.0000 mL | Freq: Once | INTRAVENOUS | Status: AC | PRN
  Administered 2023-05-02: 10 mL

## 2023-05-02 MED ORDER — HEPARIN SOD (PORK) LOCK FLUSH 100 UNIT/ML IV SOLN
500.0000 [IU] | Freq: Once | INTRAVENOUS | Status: AC | PRN
  Administered 2023-05-02: 500 [IU]

## 2023-05-02 MED ORDER — SODIUM CHLORIDE 0.9 % IV SOLN: INTRAVENOUS | Status: DC

## 2023-05-02 NOTE — Patient Instructions (Signed)

## 2023-05-03 ENCOUNTER — Encounter: Payer: Self-pay | Admitting: Hematology

## 2023-05-04 ENCOUNTER — Inpatient Hospital Stay: Payer: Medicare Other

## 2023-05-04 ENCOUNTER — Other Ambulatory Visit: Payer: Self-pay

## 2023-05-04 VITALS — BP 146/89 | HR 61 | Temp 98.5°F | Resp 16

## 2023-05-04 DIAGNOSIS — E883 Tumor lysis syndrome: Secondary | ICD-10-CM

## 2023-05-04 DIAGNOSIS — C8318 Mantle cell lymphoma, lymph nodes of multiple sites: Secondary | ICD-10-CM

## 2023-05-04 MED ORDER — SODIUM CHLORIDE 0.9% FLUSH
10.0000 mL | Freq: Once | INTRAVENOUS | Status: AC | PRN
  Administered 2023-05-04: 10 mL

## 2023-05-04 MED ORDER — HEPARIN SOD (PORK) LOCK FLUSH 100 UNIT/ML IV SOLN
500.0000 [IU] | Freq: Once | INTRAVENOUS | Status: AC | PRN
  Administered 2023-05-04: 500 [IU]

## 2023-05-04 MED ORDER — SODIUM CHLORIDE 0.9 % IV SOLN: INTRAVENOUS | Status: DC

## 2023-05-04 NOTE — Progress Notes (Signed)
Jerry Tran presents to clinic today for follow up after completing radiation to his nasopharyngeal bilateral tonsillar area. He also had inguinal adenopathy. He completed treatment on 04-24-23.   Pain issues, if any: Denies Using a feeding tube?: N/A Weight changes, if any:  Wt Readings from Last 3 Encounters:  05/18/23 183 lb 2 oz (83.1 kg)  04/30/23 173 lb 8 oz (78.7 kg)  04/26/23 181 lb 6.4 oz (82.3 kg)   Swallowing issues, if any: Reports occasional discomfort with swallowing solid foods, but overall denies any issues or concerns and reports a healthy appetite Smoking or chewing tobacco? None Using fluoride toothpaste daily? Yes--denies any mouth sores or dental concerns Last ENT visit was on: N/A--last saw his medical oncologist Dr. Candise Che on 04/26/2023  Other notable issues, if any: Reports occasional dry mouth and thick saliva. Denies any jaw or ear pain, or difficulty opening his mouth. Denies any new areas of swelling or lumps/bumps to his neck.

## 2023-05-04 NOTE — Patient Instructions (Signed)

## 2023-05-08 ENCOUNTER — Other Ambulatory Visit: Payer: Self-pay

## 2023-05-08 ENCOUNTER — Other Ambulatory Visit (HOSPITAL_COMMUNITY): Payer: Self-pay

## 2023-05-10 ENCOUNTER — Encounter: Payer: Self-pay | Admitting: Hematology

## 2023-05-10 ENCOUNTER — Other Ambulatory Visit (HOSPITAL_COMMUNITY): Payer: Self-pay

## 2023-05-11 ENCOUNTER — Other Ambulatory Visit: Payer: Self-pay | Admitting: Physician Assistant

## 2023-05-11 ENCOUNTER — Other Ambulatory Visit (HOSPITAL_COMMUNITY): Payer: Self-pay

## 2023-05-14 ENCOUNTER — Other Ambulatory Visit (HOSPITAL_COMMUNITY): Payer: Self-pay

## 2023-05-14 ENCOUNTER — Encounter (HOSPITAL_COMMUNITY): Payer: Self-pay

## 2023-05-14 MED ORDER — SUCRALFATE 1 GM/10ML PO SUSP
1.0000 g | Freq: Three times a day (TID) | ORAL | 0 refills | Status: DC
  Filled 2023-05-14: qty 420, 11d supply, fill #0

## 2023-05-16 ENCOUNTER — Other Ambulatory Visit: Payer: Self-pay

## 2023-05-16 ENCOUNTER — Other Ambulatory Visit (HOSPITAL_COMMUNITY): Payer: Self-pay

## 2023-05-16 DIAGNOSIS — C8318 Mantle cell lymphoma, lymph nodes of multiple sites: Secondary | ICD-10-CM

## 2023-05-16 MED ORDER — IBRUTINIB 420 MG PO TABS
420.0000 mg | ORAL_TABLET | Freq: Every day | ORAL | 0 refills | Status: DC
Start: 2023-05-16 — End: 2023-06-07
  Filled 2023-05-16 (×3): qty 28, 28d supply, fill #0

## 2023-05-18 ENCOUNTER — Ambulatory Visit
Admission: RE | Admit: 2023-05-18 | Discharge: 2023-05-18 | Disposition: A | Discharge status: Home / Self Care | Payer: Medicare Other | Source: Ambulatory Visit | Attending: Radiation Oncology | Admitting: Radiation Oncology

## 2023-05-18 ENCOUNTER — Other Ambulatory Visit (HOSPITAL_COMMUNITY): Payer: Self-pay

## 2023-05-18 ENCOUNTER — Other Ambulatory Visit: Payer: Self-pay

## 2023-05-18 VITALS — BP 121/79 | HR 62 | Temp 97.9°F | Resp 18 | Ht 76.0 in | Wt 183.1 lb

## 2023-05-18 DIAGNOSIS — Z79899 Other long term (current) drug therapy: Secondary | ICD-10-CM | POA: Insufficient documentation

## 2023-05-18 DIAGNOSIS — C8318 Mantle cell lymphoma, lymph nodes of multiple sites: Secondary | ICD-10-CM | POA: Insufficient documentation

## 2023-05-18 DIAGNOSIS — Z923 Personal history of irradiation: Secondary | ICD-10-CM | POA: Insufficient documentation

## 2023-05-18 NOTE — Progress Notes (Signed)
Radiation Oncology         [phone] ________________________________  Name: Jerry Tran MRN: [id number]  Date: 05/18/2023  DOB: [birth year]  Follow-Up Visit Note  Outpatient  CC: Center, [facility]  Jerry Maine, MD  Diagnosis and Prior Radiotherapy:    ICD-10-CM   1. 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)  C83.18       CHIEF COMPLAINT: Here for follow-up and surveillance of lymphoma  Narrative:  The patient returns today for routine follow-up.  He is feeling well after receiving 25Gy/23fx to H+N lymphoma three weeks ago.  Mild taste changes persist.  Swallowing issues, if any: Reports occasional discomfort with swallowing solid foods, but overall denies any issues or concerns and reports a healthy appetite Smoking or chewing tobacco? None Using fluoride toothpaste daily? Yes--denies any mouth sores or dental concerns Last ENT visit was on: N/A--last saw his medical oncologist Dr. Candise Che on 04/26/2023  Other notable issues, if any: Reports occasional dry mouth and thick saliva. Denies any jaw or ear pain, or difficulty opening his mouth. Denies any new areas of swelling or lumps/bumps to his neck.   ALLERGIES:  is allergic to sildenafil.  Meds: Current Outpatient Medications  Medication Sig Dispense Refill   ammonium lactate (LAC-HYDRIN) 12 % lotion Apply 1 application topically 2 (two) times daily as needed for dry skin.     atorvastatin (LIPITOR) 20 MG tablet Take 10 mg by mouth at bedtime.     Cholecalciferol (VITAMIN D3 PO) Take 1 tablet by mouth every morning.     diclofenac Sodium (VOLTAREN) 1 % GEL Apply 2 g topically 4 (four) times daily as needed (pain).     feeding supplement (ENSURE ENLIVE / ENSURE PLUS) LIQD Take 237 mLs by mouth 2 (two) times daily between meals. 237 mL 12   flecainide (TAMBOCOR) 100 MG tablet Take 100 mg by mouth every 12 (twelve) hours.     fluticasone (FLONASE) 50 MCG/ACT nasal spray Place 1 spray into  both nostrils daily as needed for allergies or rhinitis.     furosemide (LASIX) 20 MG tablet Take 20 mg by mouth.     hydrocortisone (ANUSOL-HC) 25 MG suppository Place 1 suppository (25 mg total) rectally 2 (two) times daily. 12 suppository 0   hydroxypropyl methylcellulose / hypromellose (ISOPTO TEARS / GONIOVISC) 2.5 % ophthalmic solution Place 2 drops into both eyes 2 (two) times daily as needed for dry eyes.     ibrutinib (IMBRUVICA) 420 MG tablet Take 1 tablet (420 mg total) by mouth daily. Take with a glass of water. 28 tablet 0   LORazepam (ATIVAN) 0.5 MG tablet Take 1 tablet (0.5 mg total) by mouth every 6 (six) hours as needed (Nausea or vomiting). 30 tablet 0   magic mouthwash (multi-ingredient) oral suspension Take 5 mLs by mouth 4 times daily as needed for mouth and throat pain. 400 mL 1   magic mouthwash w/lidocaine SOLN Take 5 mLs by mouth 4 (four) times daily as needed for mouth pain (mouth and throat pain related to radiation). Compound 400 ml total volume with  80ml of Maalox 80ml of Nystatin 100,000 unit/ml 80ml of 2% viscous lidocaine 80ml of Diphenhydramine 12.5mg /32ml 80ml of Distilled water. 400 mL 1   metoprolol tartrate (LOPRESSOR) 25 MG tablet Take 12.5 mg by mouth 2 (two) times daily. Take with flecainide     mirtazapine (REMERON) 30 MG tablet Take 30 mg by mouth at bedtime.     nystatin (  MYCOSTATIN) 100000 UNIT/ML suspension Take 5 mLs (500,000 Units total) by mouth 4 (four) times daily. 200 mL 2   omeprazole (PRILOSEC) 20 MG capsule Take 20 mg by mouth daily before breakfast.     potassium chloride SA (KLOR-CON M) 20 MEQ tablet TAKE 1 TABLET(20 MEQ) BY MOUTH TWICE DAILY 180 tablet 2   prazosin (MINIPRESS) 2 MG capsule Take 6 mg by mouth at bedtime.     sucralfate (CARAFATE) 1 GM/10ML suspension Take 10 mLs by mouth 4 times daily -  with meals and at bedtime. 420 mL 0   terbinafine (LAMISIL) 1 % cream Apply 1 application topically 2 (two) times daily as needed  (athlete's foot).     torsemide (DEMADEX) 10 MG tablet Take 20 mg by mouth daily.     triamcinolone cream (KENALOG) 0.1 % Apply 1 application topically 2 (two) times daily as needed (rash).     No current facility-administered medications for this encounter.    Physical Findings: The patient is in no acute distress. Patient is alert and oriented.  height is 6\' 4"  (1.93 m) and weight is 183 lb 2 oz (83.1 kg). His temporal temperature is 97.9 F (36.6 C). His blood pressure is 121/79 and his pulse is 62. His respiration is 18 and oxygen saturation is 100%. Marland Kitchen    HEENT: No visible tumor on exam in throat- no thrush NECK: no masses Skin: no lesions of skin of neck/face  Lab Findings: Lab Results  Component Value Date   WBC 4.1 04/30/2023   HGB 10.4 (L) 04/30/2023   HCT 31.6 (L) 04/30/2023   MCV 84.5 04/30/2023   PLT 132 (L) 04/30/2023    Radiographic Findings: No results found.  Impression/Plan:  doing well with mild taste changes that will likely improve with time.  Healing well from RT. No visible or palpable tumor on exam. I will see him PRN - f/u with med onc as scheduled.  On date of service, in total, I spent 30 minutes on this encounter. Patient was seen in person.  _____________________________________   Lonie Peak, MD

## 2023-05-23 ENCOUNTER — Telehealth: Payer: Self-pay | Admitting: *Deleted

## 2023-05-23 NOTE — Telephone Encounter (Signed)
Returned PC to patient's wife [name] - she called earlier stating patient has two knots on each of his thighs near his groin which are new.  He also has a knot on the back of his left upper arm. The knots are not painful or red.   IAlfonse Spruce PA-C informed, she would like patient to be seen in [facility] for evaluation.  Staff message sent to K. Walisiewicz PA-C to see if he can have an appointment in [facility] tomorrow.  Patient's wife informed, she verbalizes understanding.

## 2023-05-24 ENCOUNTER — Telehealth: Payer: Self-pay | Admitting: *Deleted

## 2023-05-24 ENCOUNTER — Inpatient Hospital Stay (HOSPITAL_BASED_OUTPATIENT_CLINIC_OR_DEPARTMENT_OTHER): Payer: Medicare Other | Admitting: Physician Assistant

## 2023-05-24 VITALS — BP 103/71 | HR 62 | Temp 97.7°F | Resp 16 | Wt 189.4 lb

## 2023-05-24 DIAGNOSIS — C8318 Mantle cell lymphoma, lymph nodes of multiple sites: Secondary | ICD-10-CM | POA: Diagnosis not present

## 2023-05-24 DIAGNOSIS — R591 Generalized enlarged lymph nodes: Secondary | ICD-10-CM | POA: Diagnosis not present

## 2023-05-24 NOTE — Telephone Encounter (Signed)
Attempted to contact patient to inform that appt available for him to be seen today at 2 p in [facility]. Called [phone] - not in service. Contacted patient on other available number and contacted spouse on her number. LVM for both requesting call back to office to confirm if he can come to appt today. Ms Holeman called office @ [phone]. She states patient will come to appt today at 2p.

## 2023-05-24 NOTE — Progress Notes (Signed)
Symptom Management Consult Note [facility]    Patient Care Team: Center, [facility] as PCP - General (General Practice)    Name / MRN / DOB: Jerry Tran  [id number]  1946-10-12   Date of visit: 05/24/2023   Chief Complaint/Reason for visit: swollen lymph nodes   Current Therapy: Rituximab-abbs and PO Ibrutinib    Last treatment:  Day 1   Cycle 1 on 04/26/23   ASSESSMENT & PLAN: Patient is a 77 y.o. male  with oncologic history of mantle cell lymphoma followed by Dr. Candise Che.  I have viewed most recent oncology note and lab work.    #Mantel cell lymphoma - Finished radiation 04/24/23 - Had first maintenance rituxan on 04/26/23 - Next appointment with oncologist is 06/29/23   #Lymphadenopathy -Exam with multiple enlarge non tender immobile axillary and inguinal lymph nodes -Plan per last oncology note was for PET scan 2 months after completing radiation therapy.  With sudden onset of new multiple enlarged lymph nodes concern for rapid disease progression and PET scan has been ordered.  -Will discuss results with patient when available.  Strict ED precautions discussed should symptoms worsen.    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 male with oncologic history  as above presenting to [facility] today with chief complaint of swollen lymph nodes. He is accompanied by his spouse who provides additional information.  Patient reports he noticed the lymph nodes were swollen x 2 days ago. He reports swollen lymph nodes in both arms and his groin. He denies associated pain. The lymph nodes have grown since he first noticed them. He denies any recent illness. He did apply Voltaren gel to the lymph node on his left arm and thinks it might have made it smaller. Patient admits he reported he had a lump on his inner thigh that was not persistent at last oncology visit. Patient reports he is finally recovering from radiation therapy and is relieved to be able to eat solid foods again. He is starting to gain weight. He thinks he is tolerating Ibrutinib well so far.      ROS  All other systems are reviewed and are negative for acute change except as noted in the HPI.    Allergies  Allergen Reactions   Sildenafil Other (See Comments)    Unknown reaction - reported by [facility]     Past Medical History:  Diagnosis Date   Arthritis    Chronic kidney disease, stage 3b (HCC) 12/09/2021   Dyslipidemia    GERD (gastroesophageal reflux disease)    Hypertension    PAF (paroxysmal atrial fibrillation) (HCC)    Prostate cancer (HCC) 03/2021   s/p radiation therapy   PTSD (post-traumatic stress disorder)  Past Surgical History:  Procedure Laterality Date   IR IMAGING GUIDED PORT INSERTION  11/06/2022   LIPOMA EXCISION Right 12/09/2021   Procedure: RIGHT SUPERCLAVICULAR LYMPH NODE EXCISION;  Surgeon: Andria Meuse, MD;  Location: [facility] OR;  Service: General;  Laterality: Right;    Social History   Socioeconomic History   Marital status: Married    Spouse name: Not on file   Number of children: Not on file   Years of education: Not on file   Highest education level: Not on file  Occupational History   Occupation: retired  Tobacco Use   Smoking status: Former     Current packs/day: 1.00    Average packs/day: 1 pack/day for 15.0 years (15.0 ttl pk-yrs)    Types: Cigarettes   Smokeless tobacco: Never  Substance and Sexual Activity   Alcohol use: Not Currently    Comment: h/o heavy use   Drug use: Not Currently    Types: Marijuana, Cocaine    Comment: remote   Sexual activity: Not on file  Other Topics Concern   Not on file  Social History Narrative   Not on file   Social Determinants of Health   Financial Resource Strain: Not on file  Food Insecurity: No Food Insecurity (04/03/2023)   Hunger Vital Sign    Worried About Running Out of Food in the Last Year: Never true    Ran Out of Food in the Last Year: Never true  Transportation Needs: No Transportation Needs (04/03/2023)   PRAPARE - Administrator, Civil Service (Medical): No    Lack of Transportation (Non-Medical): No  Physical Activity: Not on file  Stress: Not on file  Social Connections: Not on file  Intimate Partner Violence: Not At Risk (04/03/2023)   Humiliation, Afraid, Rape, and Kick questionnaire    Fear of Current or Ex-Partner: No    Emotionally Abused: No    Physically Abused: No    Sexually Abused: No    Family History  Problem Relation Age of Onset   Cancer Neg Hx      Current Outpatient Medications:    ammonium lactate (LAC-HYDRIN) 12 % lotion, Apply 1 application topically 2 (two) times daily as needed for dry skin., Disp: , Rfl:    atorvastatin (LIPITOR) 20 MG tablet, Take 10 mg by mouth at bedtime., Disp: , Rfl:    Cholecalciferol (VITAMIN D3 PO), Take 1 tablet by mouth every morning., Disp: , Rfl:    diclofenac Sodium (VOLTAREN) 1 % GEL, Apply 2 g topically 4 (four) times daily as needed (pain)., Disp: , Rfl:    feeding supplement (ENSURE ENLIVE / ENSURE PLUS) LIQD, Take 237 mLs by mouth 2 (two) times daily between meals., Disp: 237 mL, Rfl: 12   flecainide (TAMBOCOR) 100 MG tablet, Take 100 mg by mouth every 12 (twelve) hours., Disp: , Rfl:     fluticasone (FLONASE) 50 MCG/ACT nasal spray, Place 1 spray into both nostrils daily as needed for allergies or rhinitis., Disp: , Rfl:    furosemide (LASIX) 20 MG tablet, Take 20 mg by mouth., Disp: , Rfl:    hydrocortisone (ANUSOL-HC) 25 MG suppository, Place 1 suppository (25 mg total) rectally 2 (two) times daily., Disp: 12 suppository, Rfl: 0   hydroxypropyl methylcellulose / hypromellose (ISOPTO TEARS / GONIOVISC) 2.5 % ophthalmic solution, Place 2 drops into both eyes 2 (two) times daily as needed for dry eyes., Disp: , Rfl:    ibrutinib (IMBRUVICA) 420 MG tablet, Take 1  tablet (420 mg total) by mouth daily. Take with a glass of water., Disp: 28 tablet, Rfl: 0   LORazepam (ATIVAN) 0.5 MG tablet, Take 1 tablet (0.5 mg total) by mouth every 6 (six) hours as needed (Nausea or vomiting)., Disp: 30 tablet, Rfl: 0   magic mouthwash (multi-ingredient) oral suspension, Take 5 mLs by mouth 4 times daily as needed for mouth and throat pain., Disp: 400 mL, Rfl: 1   magic mouthwash w/lidocaine SOLN, Take 5 mLs by mouth 4 (four) times daily as needed for mouth pain (mouth and throat pain related to radiation). Compound 400 ml total volume with  80ml of Maalox 80ml of Nystatin 100,000 unit/ml 80ml of 2% viscous lidocaine 80ml of Diphenhydramine 12.5mg /53ml 80ml of Distilled water., Disp: 400 mL, Rfl: 1   metoprolol tartrate (LOPRESSOR) 25 MG tablet, Take 12.5 mg by mouth 2 (two) times daily. Take with flecainide, Disp: , Rfl:    mirtazapine (REMERON) 30 MG tablet, Take 30 mg by mouth at bedtime., Disp: , Rfl:    nystatin (MYCOSTATIN) 100000 UNIT/ML suspension, Take 5 mLs (500,000 Units total) by mouth 4 (four) times daily., Disp: 200 mL, Rfl: 2   omeprazole (PRILOSEC) 20 MG capsule, Take 20 mg by mouth daily before breakfast., Disp: , Rfl:    potassium chloride SA (KLOR-CON M) 20 MEQ tablet, TAKE 1 TABLET(20 MEQ) BY MOUTH TWICE DAILY, Disp: 180 tablet, Rfl: 2   prazosin (MINIPRESS) 2 MG capsule, Take 6 mg by  mouth at bedtime., Disp: , Rfl:    sucralfate (CARAFATE) 1 GM/10ML suspension, Take 10 mLs by mouth 4 times daily -  with meals and at bedtime., Disp: 420 mL, Rfl: 0   terbinafine (LAMISIL) 1 % cream, Apply 1 application topically 2 (two) times daily as needed (athlete's foot)., Disp: , Rfl:    torsemide (DEMADEX) 10 MG tablet, Take 20 mg by mouth daily., Disp: , Rfl:    triamcinolone cream (KENALOG) 0.1 %, Apply 1 application topically 2 (two) times daily as needed (rash)., Disp: , Rfl:   PHYSICAL EXAM: ECOG FS:1 - Symptomatic but completely ambulatory    Vitals:   05/24/23 1420  BP: 103/71  Pulse: 62  Resp: 16  Temp: 97.7 F (36.5 C)  TempSrc: Oral  SpO2: 100%  Weight: 189 lb 6.4 oz (85.9 kg)   Physical Exam Vitals and nursing note reviewed.  Constitutional:      Appearance: He is not ill-appearing or toxic-appearing.  HENT:     Head: Normocephalic.  Eyes:     Conjunctiva/sclera: Conjunctivae normal.  Cardiovascular:     Rate and Rhythm: Normal rate.  Pulmonary:     Effort: Pulmonary effort is normal.     Breath sounds: Normal breath sounds.  Abdominal:     General: There is no distension.  Musculoskeletal:     Cervical back: Normal range of motion.  Lymphadenopathy:     Cervical: No cervical adenopathy.     Upper Body:     Right upper body: Epitrochlear adenopathy (2 quarter sized lymph nodes, non tender, immobile) present. No supraclavicular, axillary or pectoral adenopathy.     Left upper body: Epitrochlear adenopathy (2 quarter sized lymph nodes palpated. Non tender, immobile) present. No supraclavicular, axillary or pectoral adenopathy.     Lower Body: Right inguinal adenopathy (5 x 3 cm and 5 x 4 cm. Non tender, immobile) present. Left inguinal adenopathy (6 x 5 cm, non tender, immobile) present.  Skin:    General: Skin is warm and dry.  Findings: No rash.  Neurological:     Mental Status: He is alert.        LABORATORY DATA: I have reviewed the data  as listed    Latest Ref Rng & Units 04/30/2023    1:29 PM 04/26/2023   11:11 AM 04/06/2023   12:09 PM  CBC  WBC 4.0 - 10.5 K/uL 4.1  3.4  4.4   Hemoglobin 13.0 - 17.0 g/dL 16.1  9.1  9.7   Hematocrit 39.0 - 52.0 % 31.6  28.8  30.0   Platelets 150 - 400 K/uL 132  113  145         Latest Ref Rng & Units 04/30/2023    1:29 PM 04/06/2023   12:09 PM 03/23/2023    3:13 PM  CMP  Glucose 70 - 99 mg/dL 88  97  85   BUN 8 - 23 mg/dL 14  36  28   Creatinine 0.61 - 1.24 mg/dL 0.96  0.45  4.09   Sodium 135 - 145 mmol/L 137  134  141   Potassium 3.5 - 5.1 mmol/L 3.5  4.2  4.4   Chloride 98 - 111 mmol/L 101  103  107   CO2 22 - 32 mmol/L 29  25  29    Calcium 8.9 - 10.3 mg/dL 9.0  9.4  9.6   Total Protein 6.5 - 8.1 g/dL 7.0  7.1  6.9   Total Bilirubin 0.3 - 1.2 mg/dL 0.5  0.3  0.3   Alkaline Phos 38 - 126 U/L 55  64  66   AST 15 - 41 U/L 13  15  14    ALT 0 - 44 U/L 7  8  7         RADIOGRAPHIC STUDIES (from last 24 hours if applicable) I have personally reviewed the radiological images as listed and agreed with the findings in the report. No results found.      Visit Diagnosis: 1. 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 Lymphadenopathy      Orders Placed This Encounter  Procedures   NM PET Image Restag (PS) Skull Base To Thigh    Standing Status:   Future    Standing Expiration Date:   05/23/2024    Order Specific Question:   If indicated for the ordered procedure, I authorize the administration of a radiopharmaceutical per Radiology protocol    Answer:   Yes    Order Specific Question:   Preferred imaging location?    Answer:   [facility]    All questions were answered. The patient knows to call the clinic with any problems, questions or concerns. No barriers to learning was detected.  A total of more than 30 minutes were spent on this encounter with face-to-face time and non-face-to-face time, including preparing to see the patient, ordering tests  and/or medications, counseling the patient and coordination of care as outlined above.    Thank you for allowing me to participate in the care of this patient.    Shanon Ace, PA-C Department of Hematology/Oncology [facility] [place] at [facility] Phone: [phone]  [phone]    05/25/2023 12:11 PM

## 2023-05-25 ENCOUNTER — Encounter: Payer: Self-pay | Admitting: Hematology

## 2023-05-28 ENCOUNTER — Other Ambulatory Visit: Payer: Self-pay | Admitting: Physician Assistant

## 2023-05-28 ENCOUNTER — Telehealth: Payer: Self-pay

## 2023-05-28 DIAGNOSIS — C8318 Mantle cell lymphoma, lymph nodes of multiple sites: Secondary | ICD-10-CM

## 2023-05-28 DIAGNOSIS — R591 Generalized enlarged lymph nodes: Secondary | ICD-10-CM

## 2023-05-28 NOTE — Telephone Encounter (Signed)
This RN called and spoke with patient and his wife regarding upcoming CT scan. Patient verbalized understanding to arrive at [facility] entrance A tomorrow, 05/29/23, at 0900 to drink oral contrast. Patient is aware to be NPO 4 hours prior to scan. No questions at this time.

## 2023-05-28 NOTE — Progress Notes (Signed)
Informed by prior authorization team that PET scan ordered on 05/24/23 for new, rapid growth epitrochlear and inguinal lymphadenopathy. I have canceled the PET and ordered CT CAP for further evaluation of lymphadenopathy. Patient will need labs prior to CT CAP as he has not had any since 04/30/23.  CT CAP pending prior authorization. Patient and spouse will be updated on plan.

## 2023-05-29 ENCOUNTER — Ambulatory Visit (HOSPITAL_COMMUNITY)
Admission: RE | Admit: 2023-05-29 | Discharge: 2023-05-29 | Disposition: A | Discharge status: Home / Self Care | Payer: Medicare Other | Source: Ambulatory Visit | Attending: Physician Assistant | Admitting: Physician Assistant

## 2023-05-29 ENCOUNTER — Other Ambulatory Visit: Payer: Self-pay | Admitting: Hematology and Oncology

## 2023-05-29 DIAGNOSIS — C8318 Mantle cell lymphoma, lymph nodes of multiple sites: Secondary | ICD-10-CM | POA: Insufficient documentation

## 2023-05-29 DIAGNOSIS — R591 Generalized enlarged lymph nodes: Secondary | ICD-10-CM | POA: Diagnosis present

## 2023-05-29 MED ORDER — IOHEXOL 350 MG/ML SOLN
75.0000 mL | Freq: Once | INTRAVENOUS | Status: AC | PRN
  Administered 2023-05-29: 75 mL via INTRAVENOUS

## 2023-05-30 ENCOUNTER — Other Ambulatory Visit: Payer: Self-pay | Admitting: Physician Assistant

## 2023-05-30 ENCOUNTER — Inpatient Hospital Stay (HOSPITAL_BASED_OUTPATIENT_CLINIC_OR_DEPARTMENT_OTHER): Payer: Medicare Other | Admitting: Physician Assistant

## 2023-05-30 ENCOUNTER — Other Ambulatory Visit: Payer: Self-pay

## 2023-05-30 ENCOUNTER — Inpatient Hospital Stay: Payer: Medicare Other

## 2023-05-30 DIAGNOSIS — C8318 Mantle cell lymphoma, lymph nodes of multiple sites: Secondary | ICD-10-CM

## 2023-05-30 LAB — CBC WITH DIFFERENTIAL (CANCER CENTER ONLY)
Abs Immature Granulocytes: 0.03 10*3/uL (ref 0.00–0.07)
Basophils Absolute: 0 10*3/uL (ref 0.0–0.1)
Basophils Relative: 1 %
Eosinophils Absolute: 0.1 10*3/uL (ref 0.0–0.5)
Eosinophils Relative: 1 %
HCT: 31.3 % — ABNORMAL LOW (ref 39.0–52.0)
Hemoglobin: 10.1 g/dL — ABNORMAL LOW (ref 13.0–17.0)
Immature Granulocytes: 1 %
Lymphocytes Relative: 34 %
Lymphs Abs: 1.2 10*3/uL (ref 0.7–4.0)
MCH: 27.5 pg (ref 26.0–34.0)
MCHC: 32.3 g/dL (ref 30.0–36.0)
MCV: 85.3 fL (ref 80.0–100.0)
Monocytes Absolute: 0.3 10*3/uL (ref 0.1–1.0)
Monocytes Relative: 10 %
Neutro Abs: 1.9 10*3/uL (ref 1.7–7.7)
Neutrophils Relative %: 53 %
Platelet Count: 126 10*3/uL — ABNORMAL LOW (ref 150–400)
RBC: 3.67 MIL/uL — ABNORMAL LOW (ref 4.22–5.81)
RDW: 16.1 % — ABNORMAL HIGH (ref 11.5–15.5)
WBC Count: 3.6 10*3/uL — ABNORMAL LOW (ref 4.0–10.5)
nRBC: 0 % (ref 0.0–0.2)

## 2023-05-30 LAB — CMP (CANCER CENTER ONLY)
ALT: 10 U/L (ref 0–44)
AST: 19 U/L (ref 15–41)
Albumin: 3.6 g/dL (ref 3.5–5.0)
Alkaline Phosphatase: 50 U/L (ref 38–126)
Anion gap: 4 — ABNORMAL LOW (ref 5–15)
BUN: 32 mg/dL — ABNORMAL HIGH (ref 8–23)
CO2: 27 mmol/L (ref 22–32)
Calcium: 9.3 mg/dL (ref 8.9–10.3)
Chloride: 108 mmol/L (ref 98–111)
Creatinine: 1.06 mg/dL (ref 0.61–1.24)
GFR, Estimated: >60 mL/min (ref >60)
Glucose, Bld: 76 mg/dL (ref 70–99)
Potassium: 4 mmol/L (ref 3.5–5.1)
Sodium: 139 mmol/L (ref 135–145)
Total Bilirubin: 0.3 mg/dL (ref 0.3–1.2)
Total Protein: 6.1 g/dL — ABNORMAL LOW (ref 6.5–8.1)

## 2023-05-30 LAB — LACTATE DEHYDROGENASE: LDH: 240 U/L — ABNORMAL HIGH (ref 98–192)

## 2023-05-30 NOTE — Progress Notes (Signed)
I connected with Jerry Tran on 05/30/23 at 12:30 PM EDT by telephone and verified that I am speaking with the correct person using two identifiers.   I discussed the limitations, risks, security and privacy concerns of performing an evaluation and management service by telemedicine and the availability of in-person appointments. I also discussed with the patient that there may be a patient responsible charge related to this service. The patient expressed understanding and agreed to proceed.   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)  12/16/2021 Initial Diagnosis   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)   12/23/2021 - 04/28/2022 Chemotherapy   Patient is on Treatment Plan : NON-HODGKINS LYMPHOMA Rituximab D1 + Bendamustine D1,2 q28d x 6 cycles     10/03/2022 - 10/03/2022 Chemotherapy   Patient is on Treatment Plan : NON-HODGKINS LYMPHOMA Rituximab q60d Maintenance     10/27/2022 - 02/22/2023 Chemotherapy   Patient is on Treatment Plan : NON-HODGKINS LYMPHOMA R-CHOP q21d     04/26/2023 -  Chemotherapy   Patient is on Treatment Plan : NON-HODGKINS LYMPHOMA Rituximab q60d Maintenance       Current Therapy: Rituximab-abbs and PO Ibrutinib    Interval history- Jerry Tran is a 77 yo male with oncologic history as above contacted today via telephone to discuss results of CT CAP. Patient had recent [facility] visit for new lymphadenopathy. He had CT CAP on 05/29/23 for further evaluation of this.  Patient denies any significant changes since clinic visit. He states lymph nodes are about the same size, still non tender. No fever or chills. No additional complaints.      ROS  All other systems are reviewed and are negative for acute change except as noted in the HPI.    Allergies  Allergen Reactions   Sildenafil Other (See Comments)    Unknown reaction - reported by [facility]     Past Medical History:  Diagnosis Date   Arthritis    Chronic kidney disease, stage 3b (HCC) 12/09/2021   Dyslipidemia    GERD (gastroesophageal reflux disease)    Hypertension    PAF (paroxysmal atrial fibrillation) (HCC)    Prostate cancer (HCC) 03/2021   s/p radiation therapy   PTSD (post-traumatic stress disorder)      Past Surgical History:  Procedure Laterality Date   IR IMAGING GUIDED PORT INSERTION  11/06/2022   LIPOMA EXCISION Right 12/09/2021   Procedure: RIGHT SUPERCLAVICULAR LYMPH NODE EXCISION;  Surgeon: Andria Meuse, MD;  Location: [facility] OR;  Service: General;  Laterality: Right;    Social History   Socioeconomic History   Marital status: Married    Spouse name: Not on file   Number of children: Not on file   Years of education: Not on file   Highest education level: Not on file  Occupational History   Occupation: retired  Tobacco Use   Smoking status: Former    Current packs/day: 1.00    Average packs/day: 1 pack/day  for 15.0 years (15.0 ttl pk-yrs)    Types: Cigarettes   Smokeless tobacco: Never  Substance and Sexual Activity   Alcohol use: Not Currently    Comment: h/o heavy use   Drug use: Not Currently    Types: Marijuana, Cocaine    Comment: remote   Sexual activity: Not on file  Other Topics Concern   Not on file  Social History Narrative   Not on file   Social Determinants of Health   Financial Resource Strain: Not on file  Food Insecurity: No Food Insecurity (04/03/2023)   Hunger Vital Sign    Worried About Running Out of Food in the Last Year:  Never true    Ran Out of Food in the Last Year: Never true  Transportation Needs: No Transportation Needs (04/03/2023)   PRAPARE - Administrator, Civil Service (Medical): No    Lack of Transportation (Non-Medical): No  Physical Activity: Not on file  Stress: Not on file  Social Connections: Not on file  Intimate Partner Violence: Not At Risk (04/03/2023)   Humiliation, Afraid, Rape, and Kick questionnaire    Fear of Current or Ex-Partner: No    Emotionally Abused: No    Physically Abused: No    Sexually Abused: No    Family History  Problem Relation Age of Onset   Cancer Neg Hx      Current Outpatient Medications:    ammonium lactate (LAC-HYDRIN) 12 % lotion, Apply 1 application topically 2 (two) times daily as needed for dry skin., Disp: , Rfl:    atorvastatin (LIPITOR) 20 MG tablet, Take 10 mg by mouth at bedtime., Disp: , Rfl:    Cholecalciferol (VITAMIN D3 PO), Take 1 tablet by mouth every morning., Disp: , Rfl:    diclofenac Sodium (VOLTAREN) 1 % GEL, Apply 2 g topically 4 (four) times daily as needed (pain)., Disp: , Rfl:    feeding supplement (ENSURE ENLIVE / ENSURE PLUS) LIQD, Take 237 mLs by mouth 2 (two) times daily between meals., Disp: 237 mL, Rfl: 12   flecainide (TAMBOCOR) 100 MG tablet, Take 100 mg by mouth every 12 (twelve) hours., Disp: , Rfl:    fluticasone (FLONASE) 50 MCG/ACT nasal spray, Place 1 spray into both nostrils daily as needed for allergies or rhinitis., Disp: , Rfl:    furosemide (LASIX) 20 MG tablet, Take 20 mg by mouth., Disp: , Rfl:    hydrocortisone (ANUSOL-HC) 25 MG suppository, Place 1 suppository (25 mg total) rectally 2 (two) times daily., Disp: 12 suppository, Rfl: 0   hydroxypropyl methylcellulose / hypromellose (ISOPTO TEARS / GONIOVISC) 2.5 % ophthalmic solution, Place 2 drops into both eyes 2 (two) times daily as needed for dry eyes., Disp: , Rfl:    ibrutinib (IMBRUVICA) 420 MG tablet, Take 1 tablet (420 mg total) by mouth daily.  Take with a glass of water., Disp: 28 tablet, Rfl: 0   LORazepam (ATIVAN) 0.5 MG tablet, Take 1 tablet (0.5 mg total) by mouth every 6 (six) hours as needed (Nausea or vomiting)., Disp: 30 tablet, Rfl: 0   magic mouthwash (multi-ingredient) oral suspension, Take 5 mLs by mouth 4 times daily as needed for mouth and throat pain., Disp: 400 mL, Rfl: 1   magic mouthwash w/lidocaine SOLN, Take 5 mLs by mouth 4 (four) times daily as needed for mouth pain (mouth and throat pain related to radiation). Compound 400 ml total volume with  80ml of Maalox 80ml of Nystatin 100,000 unit/ml 80ml of 2% viscous  lidocaine 80ml of Diphenhydramine 12.5mg /26ml 80ml of Distilled water., Disp: 400 mL, Rfl: 1   metoprolol tartrate (LOPRESSOR) 25 MG tablet, Take 12.5 mg by mouth 2 (two) times daily. Take with flecainide, Disp: , Rfl:    mirtazapine (REMERON) 30 MG tablet, Take 30 mg by mouth at bedtime., Disp: , Rfl:    nystatin (MYCOSTATIN) 100000 UNIT/ML suspension, Take 5 mLs (500,000 Units total) by mouth 4 (four) times daily., Disp: 200 mL, Rfl: 2   omeprazole (PRILOSEC) 20 MG capsule, Take 20 mg by mouth daily before breakfast., Disp: , Rfl:    potassium chloride SA (KLOR-CON M) 20 MEQ tablet, TAKE 1 TABLET(20 MEQ) BY MOUTH TWICE DAILY, Disp: 180 tablet, Rfl: 2   prazosin (MINIPRESS) 2 MG capsule, Take 6 mg by mouth at bedtime., Disp: , Rfl:    sucralfate (CARAFATE) 1 GM/10ML suspension, Take 10 mLs by mouth 4 times daily -  with meals and at bedtime., Disp: 420 mL, Rfl: 0   terbinafine (LAMISIL) 1 % cream, Apply 1 application topically 2 (two) times daily as needed (athlete's foot)., Disp: , Rfl:    torsemide (DEMADEX) 10 MG tablet, Take 20 mg by mouth daily., Disp: , Rfl:    triamcinolone cream (KENALOG) 0.1 %, Apply 1 application topically 2 (two) times daily as needed (rash)., Disp: , Rfl:   PHYSICAL EXAM: ECOG FS:1 - Symptomatic but completely ambulatory   There were no vitals filed for this visit.  Patient  speaking in clear sentences, no respiratory distress    LABORATORY DATA: I have reviewed the data as listed    Latest Ref Rng & Units 04/30/2023    1:29 PM 04/26/2023   11:11 AM 04/06/2023   12:09 PM  CBC  WBC 4.0 - 10.5 K/uL 4.1  3.4  4.4   Hemoglobin 13.0 - 17.0 g/dL 16.1  9.1  9.7   Hematocrit 39.0 - 52.0 % 31.6  28.8  30.0   Platelets 150 - 400 K/uL 132  113  145         Latest Ref Rng & Units 04/30/2023    1:29 PM 04/06/2023   12:09 PM 03/23/2023    3:13 PM  CMP  Glucose 70 - 99 mg/dL 88  97  85   BUN 8 - 23 mg/dL 14  36  28   Creatinine 0.61 - 1.24 mg/dL 0.96  0.45  4.09   Sodium 135 - 145 mmol/L 137  134  141   Potassium 3.5 - 5.1 mmol/L 3.5  4.2  4.4   Chloride 98 - 111 mmol/L 101  103  107   CO2 22 - 32 mmol/L 29  25  29    Calcium 8.9 - 10.3 mg/dL 9.0  9.4  9.6   Total Protein 6.5 - 8.1 g/dL 7.0  7.1  6.9   Total Bilirubin 0.3 - 1.2 mg/dL 0.5  0.3  0.3   Alkaline Phos 38 - 126 U/L 55  64  66   AST 15 - 41 U/L 13  15  14    ALT 0 - 44 U/L 7  8  7         RADIOGRAPHIC STUDIES: I have personally reviewed the radiological images as listed and agreed with the findings in the report. No images are attached to the encounter. CT CHEST ABDOMEN PELVIS W CONTRAST  Result Date: 05/29/2023 CLINICAL DATA:  Worsening lymphadenopathy. History of mantle cell lymphoma. * Tracking Code: BO * EXAM: CT CHEST, ABDOMEN, AND PELVIS WITH CONTRAST  TECHNIQUE: Multidetector CT imaging of the chest, abdomen and pelvis was performed following the standard protocol during bolus administration of intravenous contrast. RADIATION DOSE REDUCTION: This exam was performed according to the departmental dose-optimization program which includes automated exposure control, adjustment of the mA and/or kV according to patient size and/or use of iterative reconstruction technique. CONTRAST:  75mL OMNIPAQUE IOHEXOL 350 MG/ML SOLN COMPARISON:  PET-CT 03/16/2023 and 01/15/2023. Abdominopelvic CT 12/08/2021. FINDINGS:  CT CHEST FINDINGS Cardiovascular: No acute vascular findings are demonstrated. There is mild atherosclerosis of the aorta, great vessels and coronary arteries. Right IJ Port-A-Cath extends to superior cavoatrial junction. The heart size is normal. Stable small pericardial effusion. Mediastinum/Nodes: New mildly enlarged right axillary lymph node measuring 1.3 cm short axis on image 13/3. No other enlarged mediastinal, hilar or axillary lymph nodes identified. The thyroid gland, trachea and esophagus demonstrate no significant findings. Lungs/Pleura: No pleural effusion or pneumothorax. Interval increased linear opacities at the left lung base consistent with atelectasis or scarring. There is a new small perihilar nodule in the right lower lobe measuring 6 x 6 mm on image 76/4, possibly a lymph node. No other pulmonary nodules are identified. Musculoskeletal/Chest wall: No chest wall mass or suspicious osseous findings. Multilevel spondylosis. Degenerative changes at both shoulders. CT ABDOMEN AND PELVIS FINDINGS Hepatobiliary: The liver is normal in density without suspicious focal abnormality. Scattered hepatic cysts are unchanged. No evidence of gallstones, gallbladder wall thickening or biliary dilatation. Pancreas: Unremarkable. No pancreatic ductal dilatation or surrounding inflammatory changes. Spleen: Normal in size without focal abnormality. Adrenals/Urinary Tract: Both adrenal glands appear normal. No evidence of urinary tract calculus, suspicious renal lesion or hydronephrosis. Small renal cysts bilaterally for which no follow-up imaging is recommended. The bladder appears unremarkable for its degree of distention. Stomach/Bowel: Enteric contrast has passed into the proximal colon. The stomach appears unremarkable for its degree of distension. No evidence of bowel wall thickening, distention or surrounding inflammatory change. The appendix appears normal. Mildly prominent stool throughout the colon.  Vascular/Lymphatic: The previously demonstrated bilateral inguinal adenopathy has worsened. Index left inguinal node measures 4.1 x 3.1 cm on image 124/3 (previously 2.0 x 1.6 cm). The largest right inguinal node measures 2.7 x 2.2 cm on image 123/3. Minimally more prominent right external iliac node measuring 1.0 cm short axis on image 107/3. No other enlarged lymph nodes are identified within the abdomen or pelvis. Mild iliac atherosclerosis. No evidence of aneurysm or large vessel occlusion. Reproductive: Seed implants noted within the prostate gland. Other: No evidence of abdominal wall mass or hernia. No ascites or pneumoperitoneum. Musculoskeletal: No acute or significant osseous findings. Multilevel lumbar spondylosis. Mild degenerative changes at both hips. Unless specific follow-up recommendations are mentioned in the findings or impression sections, no imaging follow-up of any mentioned incidental findings is recommended. IMPRESSION: 1. Interval enlargement of previously demonstrated bilateral inguinal adenopathy consistent with progressive lymphoma. 2. New mildly enlarged right axillary lymph node and right external iliac lymph node, also suspicious for lymphoma. 3. New small right lower lobe pulmonary nodule, possibly a lymph node. Recommend attention on follow-up. 4. No other evidence of lymphoma in the chest, abdomen or pelvis. 5. Stable incidental findings including small pericardial effusion, hepatic and renal cysts and mild lumbar spondylosis. Mildly prominent stool throughout the colon suggesting constipation. 6.  Aortic Atherosclerosis (ICD10-I70.0). Electronically Signed   By: Carey Bullocks M.D.   On: 05/29/2023 12:55     ASSESSMENT & PLAN: Patient is a 76 y.o. male with oncologic history of mantle  cell lymphoma followed by oncologist Dr. Candise Che.  #Mantle cell lymphoma -CT CAP showing  Interval enlargement of previously demonstrated bilateral inguinal adenopathy consistent with progressive  lymphoma. Dicussed result with covering oncologist Dr. Leonides Schanz who recommends continued observation as he only recently started Ibrutinib. I updated patient on results and recommendation. -Patient will come in for lab appointment today. Results showing elevation of LDH, 240 from 201 on 04/06/23. WBC with mild neutropenia, anemia and thrombocytopenia consistent with results of the last month. No acute intervention needed. Labs will be trended at future oncology appointments.   Visit Diagnosis: 1. Mantle cell lymphoma of lymph nodes of multiple regions [facility])      Orders Placed This Encounter  Procedures   CBC with Differential (Cancer Center Only)    Standing Status:   Future    Standing Expiration Date:   05/29/2024   CMP (Cancer Center only)    Standing Status:   Future    Standing Expiration Date:   05/29/2024   Lactate dehydrogenase (LDH)    Standing Status:   Future    Standing Expiration Date:   05/29/2024    All questions were answered. The patient knows to call the clinic with any problems, questions or concerns. No barriers to learning was detected.  Time spent with patient on telephone encounter: 10 minutes   Thank you for allowing me to participate in the care of this patient.    Shanon Ace, PA-C Department of Hematology/Oncology [facility] Phone: [phone]  [phone]    05/30/2023 12:24 PM

## 2023-06-07 ENCOUNTER — Other Ambulatory Visit (HOSPITAL_COMMUNITY): Payer: Self-pay

## 2023-06-07 ENCOUNTER — Other Ambulatory Visit: Payer: Self-pay | Admitting: Hematology

## 2023-06-07 ENCOUNTER — Encounter (HOSPITAL_COMMUNITY): Payer: Medicare Other

## 2023-06-07 DIAGNOSIS — C8318 Mantle cell lymphoma, lymph nodes of multiple sites: Secondary | ICD-10-CM

## 2023-06-07 MED ORDER — IBRUTINIB 420 MG PO TABS
420.0000 mg | ORAL_TABLET | Freq: Every day | ORAL | 0 refills | Status: DC
Start: 2023-06-07 — End: 2023-07-05
  Filled 2023-06-07: qty 28, 28d supply, fill #0

## 2023-06-08 ENCOUNTER — Other Ambulatory Visit (HOSPITAL_COMMUNITY): Payer: Self-pay

## 2023-06-08 ENCOUNTER — Ambulatory Visit: Payer: Medicare Other

## 2023-06-08 ENCOUNTER — Ambulatory Visit: Payer: Medicare Other | Admitting: Hematology

## 2023-06-08 ENCOUNTER — Other Ambulatory Visit: Payer: Medicare Other

## 2023-06-15 ENCOUNTER — Other Ambulatory Visit (HOSPITAL_COMMUNITY): Payer: Self-pay

## 2023-06-27 ENCOUNTER — Other Ambulatory Visit: Payer: Self-pay

## 2023-06-28 ENCOUNTER — Encounter: Payer: Self-pay | Admitting: Hematology

## 2023-06-28 ENCOUNTER — Other Ambulatory Visit: Payer: Self-pay

## 2023-06-28 ENCOUNTER — Other Ambulatory Visit: Payer: Self-pay | Admitting: Hematology

## 2023-06-28 DIAGNOSIS — C8318 Mantle cell lymphoma, lymph nodes of multiple sites: Secondary | ICD-10-CM

## 2023-06-28 DIAGNOSIS — Z7189 Other specified counseling: Secondary | ICD-10-CM

## 2023-06-29 ENCOUNTER — Inpatient Hospital Stay (HOSPITAL_BASED_OUTPATIENT_CLINIC_OR_DEPARTMENT_OTHER): Payer: Medicare Other | Admitting: Hematology

## 2023-06-29 ENCOUNTER — Inpatient Hospital Stay: Payer: Medicare Other | Attending: Hematology and Oncology

## 2023-06-29 ENCOUNTER — Inpatient Hospital Stay: Payer: Medicare Other

## 2023-06-29 ENCOUNTER — Ambulatory Visit: Payer: Medicare Other | Admitting: Physician Assistant

## 2023-06-29 VITALS — BP 117/78 | HR 64 | Temp 98.2°F | Resp 18

## 2023-06-29 DIAGNOSIS — Z5112 Encounter for antineoplastic immunotherapy: Secondary | ICD-10-CM | POA: Diagnosis present

## 2023-06-29 DIAGNOSIS — E883 Tumor lysis syndrome: Secondary | ICD-10-CM

## 2023-06-29 DIAGNOSIS — R197 Diarrhea, unspecified: Secondary | ICD-10-CM | POA: Insufficient documentation

## 2023-06-29 DIAGNOSIS — Z923 Personal history of irradiation: Secondary | ICD-10-CM | POA: Diagnosis not present

## 2023-06-29 DIAGNOSIS — D649 Anemia, unspecified: Secondary | ICD-10-CM | POA: Insufficient documentation

## 2023-06-29 DIAGNOSIS — Z7189 Other specified counseling: Secondary | ICD-10-CM

## 2023-06-29 DIAGNOSIS — Z79899 Other long term (current) drug therapy: Secondary | ICD-10-CM | POA: Insufficient documentation

## 2023-06-29 DIAGNOSIS — D696 Thrombocytopenia, unspecified: Secondary | ICD-10-CM | POA: Insufficient documentation

## 2023-06-29 DIAGNOSIS — C8318 Mantle cell lymphoma, lymph nodes of multiple sites: Secondary | ICD-10-CM | POA: Insufficient documentation

## 2023-06-29 DIAGNOSIS — K649 Unspecified hemorrhoids: Secondary | ICD-10-CM | POA: Insufficient documentation

## 2023-06-29 DIAGNOSIS — Z8546 Personal history of malignant neoplasm of prostate: Secondary | ICD-10-CM | POA: Diagnosis not present

## 2023-06-29 DIAGNOSIS — Z95828 Presence of other vascular implants and grafts: Secondary | ICD-10-CM

## 2023-06-29 DIAGNOSIS — Z87891 Personal history of nicotine dependence: Secondary | ICD-10-CM | POA: Insufficient documentation

## 2023-06-29 LAB — CMP (CANCER CENTER ONLY)
ALT: 10 U/L (ref 0–44)
AST: 20 U/L (ref 15–41)
Albumin: 3.6 g/dL (ref 3.5–5.0)
Alkaline Phosphatase: 52 U/L (ref 38–126)
Anion gap: 5 (ref 5–15)
BUN: 32 mg/dL — ABNORMAL HIGH (ref 8–23)
CO2: 26 mmol/L (ref 22–32)
Calcium: 9.2 mg/dL (ref 8.9–10.3)
Chloride: 108 mmol/L (ref 98–111)
Creatinine: 1.09 mg/dL (ref 0.61–1.24)
GFR, Estimated: >60 mL/min (ref >60)
Glucose, Bld: 84 mg/dL (ref 70–99)
Potassium: 3.8 mmol/L (ref 3.5–5.1)
Sodium: 139 mmol/L (ref 135–145)
Total Bilirubin: 0.4 mg/dL (ref 0.3–1.2)
Total Protein: 6.3 g/dL — ABNORMAL LOW (ref 6.5–8.1)

## 2023-06-29 LAB — CBC WITH DIFFERENTIAL (CANCER CENTER ONLY)
Abs Immature Granulocytes: 0.03 10*3/uL (ref 0.00–0.07)
Basophils Absolute: 0 10*3/uL (ref 0.0–0.1)
Basophils Relative: 1 %
Eosinophils Absolute: 0 10*3/uL (ref 0.0–0.5)
Eosinophils Relative: 1 %
HCT: 32.5 % — ABNORMAL LOW (ref 39.0–52.0)
Hemoglobin: 10.4 g/dL — ABNORMAL LOW (ref 13.0–17.0)
Immature Granulocytes: 1 %
Lymphocytes Relative: 36 %
Lymphs Abs: 1.3 10*3/uL (ref 0.7–4.0)
MCH: 27 pg (ref 26.0–34.0)
MCHC: 32 g/dL (ref 30.0–36.0)
MCV: 84.4 fL (ref 80.0–100.0)
Monocytes Absolute: 0.4 10*3/uL (ref 0.1–1.0)
Monocytes Relative: 10 %
Neutro Abs: 1.8 10*3/uL (ref 1.7–7.7)
Neutrophils Relative %: 51 %
Platelet Count: 120 10*3/uL — ABNORMAL LOW (ref 150–400)
RBC: 3.85 MIL/uL — ABNORMAL LOW (ref 4.22–5.81)
RDW: 15.9 % — ABNORMAL HIGH (ref 11.5–15.5)
WBC Count: 3.6 10*3/uL — ABNORMAL LOW (ref 4.0–10.5)
nRBC: 0 % (ref 0.0–0.2)

## 2023-06-29 MED ORDER — SODIUM CHLORIDE 0.9% FLUSH
10.0000 mL | INTRAVENOUS | Status: DC | PRN
  Administered 2023-06-29: 10 mL

## 2023-06-29 MED ORDER — ACETAMINOPHEN 325 MG PO TABS
650.0000 mg | ORAL_TABLET | Freq: Once | ORAL | Status: AC
  Administered 2023-06-29: 650 mg via ORAL
  Filled 2023-06-29: qty 2

## 2023-06-29 MED ORDER — METHYLPREDNISOLONE SODIUM SUCC 125 MG IJ SOLR
125.0000 mg | Freq: Once | INTRAMUSCULAR | Status: AC
  Administered 2023-06-29: 125 mg via INTRAVENOUS
  Filled 2023-06-29: qty 2

## 2023-06-29 MED ORDER — SODIUM CHLORIDE 0.9% FLUSH
10.0000 mL | Freq: Once | INTRAVENOUS | Status: AC
  Administered 2023-06-29: 10 mL

## 2023-06-29 MED ORDER — HEPARIN SOD (PORK) LOCK FLUSH 100 UNIT/ML IV SOLN
500.0000 [IU] | Freq: Once | INTRAVENOUS | Status: AC | PRN
  Administered 2023-06-29: 500 [IU]

## 2023-06-29 MED ORDER — DIPHENHYDRAMINE HCL 25 MG PO CAPS
50.0000 mg | ORAL_CAPSULE | Freq: Once | ORAL | Status: AC
  Administered 2023-06-29: 50 mg via ORAL
  Filled 2023-06-29: qty 2

## 2023-06-29 MED ORDER — SODIUM CHLORIDE 0.9 % IV SOLN: Freq: Once | INTRAVENOUS | Status: AC

## 2023-06-29 MED ORDER — FAMOTIDINE IN NACL 20-0.9 MG/50ML-% IV SOLN
20.0000 mg | Freq: Once | INTRAVENOUS | Status: AC
  Administered 2023-06-29: 20 mg via INTRAVENOUS
  Filled 2023-06-29: qty 50

## 2023-06-29 MED ORDER — SODIUM CHLORIDE 0.9 % IV SOLN
375.0000 mg/m2 | Freq: Once | INTRAVENOUS | Status: AC
  Administered 2023-06-29: 800 mg via INTRAVENOUS
  Filled 2023-06-29: qty 50

## 2023-06-29 NOTE — Patient Instructions (Signed)
[place] [facility]   Discharge Instructions: Thank you for choosing [facility] to provide your oncology and hematology care.   If you have a lab appointment with the Cancer Center, please go directly to the Cancer Center and check in at the registration area.   Wear comfortable clothing and clothing appropriate for easy access to any Portacath or PICC line.   We strive to give you quality time with your provider. You may need to reschedule your appointment if you arrive late (15 or more minutes).  Arriving late affects you and other patients whose appointments are after yours.  Also, if you miss three or more appointments without notifying the office, you may be dismissed from the clinic at the provider's discretion.      For prescription refill requests, have your pharmacy contact our office and allow 72 hours for refills to be completed.    Today you received the following chemotherapy and/or immunotherapy agents: Rituximab (Rituxan)      To help prevent nausea and vomiting after your treatment, we encourage you to take your nausea medication as directed.  BELOW ARE SYMPTOMS THAT SHOULD BE REPORTED IMMEDIATELY: *FEVER GREATER THAN 100.4 F (38 C) OR HIGHER *CHILLS OR SWEATING *NAUSEA AND VOMITING THAT IS NOT CONTROLLED WITH YOUR NAUSEA MEDICATION *UNUSUAL SHORTNESS OF BREATH *UNUSUAL BRUISING OR BLEEDING *URINARY PROBLEMS (pain or burning when urinating, or frequent urination) *BOWEL PROBLEMS (unusual diarrhea, constipation, pain near the anus) TENDERNESS IN MOUTH AND THROAT WITH OR WITHOUT PRESENCE OF ULCERS (sore throat, sores in mouth, or a toothache) UNUSUAL RASH, SWELLING OR PAIN  UNUSUAL VAGINAL DISCHARGE OR ITCHING   Items with * indicate a potential emergency and should be followed up as soon as possible or go to the Emergency Department if any problems should occur.  Please show the CHEMOTHERAPY ALERT CARD or IMMUNOTHERAPY ALERT  CARD at check-in to the Emergency Department and triage nurse.  Should you have questions after your visit or need to cancel or reschedule your appointment, please contact [facility]  Dept: [phone]  and follow the prompts.  Office hours are 8:00 a.m. to 4:30 p.m. Monday - Friday. Please note that voicemails left after 4:00 p.m. may not be returned until the following business day.  We are closed weekends and major holidays. You have access to a nurse at all times for urgent questions. Please call the main number to the clinic Dept: [phone] and follow the prompts.   For any non-urgent questions, you may also contact your provider using MyChart. We now offer e-Visits for anyone 25 and older to request care online for non-urgent symptoms. For details visit [email or web address].   Also download the MyChart app! Go to the app store, search "MyChart", open the app, select , and log in with your MyChart username and password.

## 2023-06-29 NOTE — Progress Notes (Signed)
HEMATOLOGY/ONCOLOGY CLINIC VISIT NOTE  Date of Service: 06/29/23  Patient Care Team: [facility] as PCP - General (General Practice)  CHIEF COMPLAINTS/PURPOSE OF CONSULTATION:  Follow-up for continued evaluation and management of relapsed mantle cell lymphoma  HISTORY OF PRESENTING ILLNESS:  Please see previous note for details of initial presentation  INTERVAL HISTORY  Jerry Tran is here for continued evaluation and management of his relapsed mantle cell lymphoma.   Patient was last seen by me on 04/26/2023 and complained of throat irritation affecting swallowing and food intake. He reported lack of taste, fluctuating weight, and a lump in his inner thigh.   He was most recently connected with by Liston Alba, PA via telemedicine visit on 05/30/2023 to discuss results of CT CAP.  Today, he is accompanied by his wife. He continues to take Ibrutinib daily and denies any new or severe toxicities. Patient denies any bleeding or bruisng issues. He has normal energy and appetitie levels. Patient has gained 6 pounds in the last month.   He complains of fluctuating bilateral inguinal swelling. Patient also reports bilateral lower extremity swelling. He denies any lumps/bumps in the throat.   Patient denies any pain in the groin, change in bowel habits, change in urinary habits, infection issues, back pain, or abdominal pain. His voice has normalized and he denies any throat soreness at this time.   MEDICAL HISTORY:  Past Medical History:  Diagnosis Date   Arthritis    Chronic kidney disease, stage 3b (HCC) 12/09/2021   Dyslipidemia    GERD (gastroesophageal reflux disease)    Hypertension    PAF (paroxysmal atrial fibrillation) (HCC)    Prostate cancer (HCC) 03/2021   s/p radiation therapy   PTSD (post-traumatic stress disorder)     SURGICAL HISTORY: Past Surgical History:  Procedure Laterality Date   IR IMAGING GUIDED PORT INSERTION  11/06/2022   LIPOMA EXCISION  Right 12/09/2021   Procedure: RIGHT SUPERCLAVICULAR LYMPH NODE EXCISION;  Surgeon: Andria Meuse, MD;  Location: [facility] OR;  Service: General;  Laterality: Right;    SOCIAL HISTORY: Social History   Socioeconomic History   Marital status: Married    Spouse name: Not on file   Number of children: Not on file   Years of education: Not on file   Highest education level: Not on file  Occupational History   Occupation: retired  Tobacco Use   Smoking status: Former    Current packs/day: 1.00    Average packs/day: 1 pack/day for 15.0 years (15.0 ttl pk-yrs)    Types: Cigarettes   Smokeless tobacco: Never  Substance and Sexual Activity   Alcohol use: Not Currently    Comment: h/o heavy use   Drug use: Not Currently    Types: Marijuana, Cocaine    Comment: remote   Sexual activity: Not on file  Other Topics Concern   Not on file  Social History Narrative   Not on file   Social Determinants of Health   Financial Resource Strain: Not on file  Food Insecurity: No Food Insecurity (04/03/2023)   Hunger Vital Sign    Worried About Running Out of Food in the Last Year: Never true    Ran Out of Food in the Last Year: Never true  Transportation Needs: No Transportation Needs (04/03/2023)   PRAPARE - Administrator, Civil Service (Medical): No    Lack of Transportation (Non-Medical): No  Physical Activity: Not on file  Stress: Not on file  Social  Connections: Not on file  Intimate Partner Violence: Not At Risk (04/03/2023)   Humiliation, Afraid, Rape, and Kick questionnaire    Fear of Current or Ex-Partner: No    Emotionally Abused: No    Physically Abused: No    Sexually Abused: No    FAMILY HISTORY: Family History  Problem Relation Age of Onset   Cancer Neg Hx     ALLERGIES:  is allergic to sildenafil.  MEDICATIONS:  Current Outpatient Medications  Medication Sig Dispense Refill   ammonium lactate (LAC-HYDRIN) 12 % lotion Apply 1 application topically 2 (two)  times daily as needed for dry skin.     atorvastatin (LIPITOR) 20 MG tablet Take 10 mg by mouth at bedtime.     Cholecalciferol (VITAMIN D3 PO) Take 1 tablet by mouth every morning.     diclofenac Sodium (VOLTAREN) 1 % GEL Apply 2 g topically 4 (four) times daily as needed (pain).     feeding supplement (ENSURE ENLIVE / ENSURE PLUS) LIQD Take 237 mLs by mouth 2 (two) times daily between meals. 237 mL 12   flecainide (TAMBOCOR) 100 MG tablet Take 100 mg by mouth every 12 (twelve) hours.     fluticasone (FLONASE) 50 MCG/ACT nasal spray Place 1 spray into both nostrils daily as needed for allergies or rhinitis.     furosemide (LASIX) 20 MG tablet Take 20 mg by mouth.     hydrocortisone (ANUSOL-HC) 25 MG suppository Place 1 suppository (25 mg total) rectally 2 (two) times daily. 12 suppository 0   hydroxypropyl methylcellulose / hypromellose (ISOPTO TEARS / GONIOVISC) 2.5 % ophthalmic solution Place 2 drops into both eyes 2 (two) times daily as needed for dry eyes.     ibrutinib (IMBRUVICA) 420 MG tablet Take 1 tablet (420 mg total) by mouth daily. Take with a glass of water. 28 tablet 0   LORazepam (ATIVAN) 0.5 MG tablet Take 1 tablet (0.5 mg total) by mouth every 6 (six) hours as needed (Nausea or vomiting). 30 tablet 0   magic mouthwash (multi-ingredient) oral suspension Take 5 mLs by mouth 4 times daily as needed for mouth and throat pain. 400 mL 1   magic mouthwash w/lidocaine SOLN Take 5 mLs by mouth 4 (four) times daily as needed for mouth pain (mouth and throat pain related to radiation). Compound 400 ml total volume with  80ml of Maalox 80ml of Nystatin 100,000 unit/ml 80ml of 2% viscous lidocaine 80ml of Diphenhydramine 12.5mg /62ml 80ml of Distilled water. 400 mL 1   metoprolol tartrate (LOPRESSOR) 25 MG tablet Take 12.5 mg by mouth 2 (two) times daily. Take with flecainide     mirtazapine (REMERON) 30 MG tablet Take 30 mg by mouth at bedtime.     nystatin (MYCOSTATIN) 100000 UNIT/ML  suspension Take 5 mLs (500,000 Units total) by mouth 4 (four) times daily. 200 mL 2   omeprazole (PRILOSEC) 20 MG capsule Take 20 mg by mouth daily before breakfast.     potassium chloride SA (KLOR-CON M) 20 MEQ tablet TAKE 1 TABLET(20 MEQ) BY MOUTH TWICE DAILY 180 tablet 2   prazosin (MINIPRESS) 2 MG capsule Take 6 mg by mouth at bedtime.     sucralfate (CARAFATE) 1 GM/10ML suspension Take 10 mLs by mouth 4 times daily -  with meals and at bedtime. 420 mL 0   terbinafine (LAMISIL) 1 % cream Apply 1 application topically 2 (two) times daily as needed (athlete's foot).     torsemide (DEMADEX) 10 MG tablet Take 20 mg by  mouth daily.     triamcinolone cream (KENALOG) 0.1 % Apply 1 application topically 2 (two) times daily as needed (rash).     No current facility-administered medications for this visit.    REVIEW OF SYSTEMS:    10 Point review of Systems was done is negative except as noted above.   PHYSICAL EXAMINATION: .BP (!) 110/54 (BP Location: Left Arm, Patient Position: Sitting) Comment: Nurse was notified  Pulse 66   Temp 98.6 F (37 C) (Oral)   Resp 13   Wt 195 lb 1.6 oz (88.5 kg)   SpO2 100%   BMI 23.75 kg/m   GENERAL:alert, in no acute distress and comfortable SKIN: no acute rashes, no significant lesions EYES: conjunctiva are pink and non-injected, sclera anicteric OROPHARYNX: MMM, no exudates, no oropharyngeal erythema or ulceration NECK: supple, no JVD LYMPH:  no palpable lymphadenopathy in the cervical, axillary . Palpable b/l inguinal LNadenopathy. LUNGS: clear to auscultation b/l with normal respiratory effort HEART: regular rate & rhythm ABDOMEN:  normoactive bowel sounds , non tender, not distended. Extremity: b/l 1+ pedal edema PSYCH: alert & oriented x 3 with fluent speech NEURO: no focal motor/sensory deficits   LABORATORY DATA:  I have reviewed the data as listed     Latest Ref Rng & Units 06/29/2023    9:37 AM 05/30/2023    1:06 PM 04/30/2023    1:29 PM   CBC  WBC 4.0 - 10.5 K/uL 3.6  3.6  4.1   Hemoglobin 13.0 - 17.0 g/dL 16.1  09.6  04.5   Hematocrit 39.0 - 52.0 % 32.5  31.3  31.6   Platelets 150 - 400 K/uL 120  126  132       Latest Ref Rng & Units 06/29/2023    9:37 AM 05/30/2023    1:06 PM 04/30/2023    1:29 PM  CMP  Glucose 70 - 99 mg/dL 84  76  88   BUN 8 - 23 mg/dL 32  32  14   Creatinine 0.61 - 1.24 mg/dL 4.09  8.11  9.14   Sodium 135 - 145 mmol/L 139  139  137   Potassium 3.5 - 5.1 mmol/L 3.8  4.0  3.5   Chloride 98 - 111 mmol/L 108  108  101   CO2 22 - 32 mmol/L 26  27  29    Calcium 8.9 - 10.3 mg/dL 9.2  9.3  9.0   Total Protein 6.5 - 8.1 g/dL 6.3  6.1  7.0   Total Bilirubin 0.3 - 1.2 mg/dL 0.4  0.3  0.5   Alkaline Phos 38 - 126 U/L 52  50  55   AST 15 - 41 U/L 20  19  13    ALT 0 - 44 U/L 10  10  7     Lab Results  Component Value Date   LDH 240 (H) 05/30/2023    Surgical Pathology 10/16/2022: A. 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    RADIOGRAPHIC STUDIES: I have personally reviewed the radiological images as listed and agreed with the findings in the report.  CT Chest/abd/pelvis 7/30:  IMPRESSION: 1. Interval enlargement of previously demonstrated bilateral inguinal  adenopathy consistent with progressive lymphoma. 2. New mildly enlarged right axillary lymph node and right external iliac lymph node, also suspicious for lymphoma. 3. New small right lower lobe pulmonary nodule, possibly a lymph node. Recommend attention on follow-up. 4. No other evidence of lymphoma in the chest, abdomen or pelvis. 5. Stable incidental findings  including small pericardial effusion, hepatic and renal cysts and mild lumbar spondylosis. Mildly prominent stool throughout the colon suggesting constipation. 6.  Aortic Atherosclerosis (ICD10-I70.0).     Electronically Signed   By: Carey Bullocks M.D.   On: 05/29/2023 12:55   ASSESSMENT & PLAN:   77 y.o.  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  3) moderate to severe protein calorie malnutrition-He has lost 8lbs but is drinking x2-3 Ensure drinks per week.  Wt Readings from Last 3 Encounters:  06/29/23 195 lb 1.6 oz (88.5 kg)  05/24/23 189 lb 6.4 oz (85.9 kg)  05/18/23 183 lb 2 oz (83.1 kg)   4) Loose stools- He is having a few small, loose stools per day.  5) BLE swelling- resolved with compression socks.  6) Hemorrhoids- He reports pain with his hemorrhoid flare but denies any rectal bleeding. He has not seen GI or surgeon for this problem, but [facility]   7) Hoarse voice- He reports more difficulty projecting his voice. Denies any noticeable reflux but he does take acid suppression medication. He denies any significant seasonal allergies.  PLAN:  -Discussed lab results on 06/29/23 in detail with patient. CBC showed WBC of 3.6K, hemoglobin of 10.4, and platelets of 120K. -CT chest/abdm/pelvis scan on 05/29/2023 showed: 1. Interval enlargement of previously demonstrated bilateral inguinal adenopathy consistent with progressive lymphoma. 2. New mildly enlarged  right axillary lymph node and right external iliac lymph node, also suspicious for lymphoma. 3. New small right lower lobe pulmonary nodule, possibly a lymph node. Recommend attention on follow-up. 4. No other evidence of lymphoma in the chest, abdomen or pelvis. -proceed with rituxan today and continue every 2 months -will increase Ibrutinib dose to full dose of 560 MG given demonstrated tolerance to treatment at 560mg . --will refer to Dr. Basilio Cairo to consider ISRT palliatively to his b/l inguinal /iliac LN given limited sites of progression at this time. -if patient progresses on current regimen low threshold to switch him to non covalent BTKi Pirtobrutinib , allternatively could also consider Revlimid. -Given aggressive behavior patients MCL will refer patient to lymphoma specialist  (Dr Alphonsa Gin or Truddie Crumble) at [facility] to receive an additional opinion regarding proceeding options such as CAR T-cell therapies or other clinical trials. 1-answered all of patient's questions in detail  FOLLOW-UP: -Referral to Dr. Basilio Cairo radiation oncology for involved site radiation therapy to bilateral pelvic and inguinal lymph nodes. -Increasing dose of ibrutinib to 560 mg p.o. daily and considering addition of venetoclax -Return to clinic with Dr. Candise Che with labs in 4 weeks  The total time spent in the appointment was 41 minutes* .  All of the patient's questions were answered with apparent satisfaction.  The patient knows to call the clinic with any problems, questions or concerns.   Wyvonnia Lora MD MS AAHIVMS [facility] Hematology/Oncology Physician [facility]  .*Total Encounter Time as defined by the Centers for Medicare and Medicaid Services includes, in addition to the face-to-face time of a patient visit (documented in the note above) non-face-to-face time: obtaining and reviewing outside history, ordering and reviewing medications, tests or procedures, care coordination (communications with  other health care professionals or caregivers) and documentation in the medical record.    I,Mitra Faeizi,acting as a Neurosurgeon for Wyvonnia Lora, MD.,have documented all relevant documentation on the behalf of Wyvonnia Lora, MD,as directed by  Wyvonnia Lora, MD while in the presence of Wyvonnia Lora, MD.  .I have reviewed the above documentation for accuracy and completeness, and I agree with the above. [name] MD

## 2023-06-29 NOTE — Progress Notes (Signed)
Patient seen by Dr. Kale  Vitals are within treatment parameters.  Labs reviewed: and are within treatment parameters.  Per physician team, patient is ready for treatment and there are NO modifications to the treatment plan.  

## 2023-07-03 ENCOUNTER — Other Ambulatory Visit: Payer: Self-pay

## 2023-07-03 ENCOUNTER — Other Ambulatory Visit: Payer: Self-pay | Admitting: Hematology

## 2023-07-03 ENCOUNTER — Other Ambulatory Visit (HOSPITAL_COMMUNITY): Payer: Self-pay

## 2023-07-03 DIAGNOSIS — C8318 Mantle cell lymphoma, lymph nodes of multiple sites: Secondary | ICD-10-CM

## 2023-07-04 ENCOUNTER — Other Ambulatory Visit: Payer: Self-pay

## 2023-07-05 ENCOUNTER — Other Ambulatory Visit (HOSPITAL_COMMUNITY): Payer: Self-pay

## 2023-07-05 ENCOUNTER — Other Ambulatory Visit: Payer: Self-pay

## 2023-07-05 ENCOUNTER — Encounter: Payer: Self-pay | Admitting: Hematology

## 2023-07-05 MED ORDER — IBRUTINIB 560 MG PO TABS
560.0000 mg | ORAL_TABLET | Freq: Every day | ORAL | 2 refills | Status: DC
Start: 2023-07-05 — End: 2023-07-06
  Filled 2023-07-05: qty 28, 28d supply, fill #0

## 2023-07-06 ENCOUNTER — Other Ambulatory Visit (HOSPITAL_COMMUNITY): Payer: Self-pay

## 2023-07-06 ENCOUNTER — Other Ambulatory Visit: Payer: Self-pay

## 2023-07-06 ENCOUNTER — Telehealth: Payer: Self-pay | Admitting: Pharmacy Technician

## 2023-07-06 DIAGNOSIS — C8318 Mantle cell lymphoma, lymph nodes of multiple sites: Secondary | ICD-10-CM

## 2023-07-06 MED ORDER — IBRUTINIB 280 MG PO TABS
560.0000 mg | ORAL_TABLET | Freq: Every day | ORAL | 1 refills | Status: DC
Start: 2023-07-06 — End: 2023-07-25
  Filled 2023-07-06: qty 56, 28d supply, fill #0

## 2023-07-06 NOTE — Telephone Encounter (Addendum)
Oral Oncology Patient Advocate Encounter   Received notification that prior authorization for Imbruvica 280mg  tablets is required.   PA submitted on 07/05/23 Key [id number] Status is pending     Jinger Neighbors, CPhT-Adv Oncology Pharmacy Patient Advocate [facility] Direct Number: [phone]  Fax: [phone]

## 2023-07-06 NOTE — Telephone Encounter (Addendum)
Oral Oncology Patient Advocate Encounter  Prior Authorization for Imbruvica 280mg  tabs 56tabs/28days has been approved.    PA# [id number]. Effective dates: 07/06/23 through 10/30/23  Patients co-pay is $0.    Jinger Neighbors, CPhT-Adv Oncology Pharmacy Patient Advocate [facility] Direct Number: [phone]  Fax: [phone]

## 2023-07-09 ENCOUNTER — Other Ambulatory Visit: Payer: Self-pay

## 2023-07-09 ENCOUNTER — Other Ambulatory Visit (HOSPITAL_COMMUNITY): Payer: Self-pay

## 2023-07-12 ENCOUNTER — Other Ambulatory Visit: Payer: Self-pay

## 2023-07-12 DIAGNOSIS — C8318 Mantle cell lymphoma, lymph nodes of multiple sites: Secondary | ICD-10-CM

## 2023-07-12 NOTE — Progress Notes (Signed)
Referral faxed to : Dr Alphonsa Gin or Truddie Crumble at [facility] to receive an additional opinion regarding proceeding options such as CAR T-cell therapies or other clinical trials.

## 2023-07-13 ENCOUNTER — Other Ambulatory Visit (HOSPITAL_COMMUNITY): Payer: Self-pay

## 2023-07-24 ENCOUNTER — Other Ambulatory Visit: Payer: Self-pay

## 2023-07-24 DIAGNOSIS — D649 Anemia, unspecified: Secondary | ICD-10-CM

## 2023-07-24 DIAGNOSIS — C8318 Mantle cell lymphoma, lymph nodes of multiple sites: Secondary | ICD-10-CM

## 2023-07-25 ENCOUNTER — Inpatient Hospital Stay: Payer: Medicare Other | Attending: Hematology and Oncology

## 2023-07-25 ENCOUNTER — Other Ambulatory Visit (HOSPITAL_COMMUNITY): Payer: Self-pay

## 2023-07-25 ENCOUNTER — Inpatient Hospital Stay (HOSPITAL_BASED_OUTPATIENT_CLINIC_OR_DEPARTMENT_OTHER): Payer: Medicare Other | Admitting: Hematology

## 2023-07-25 ENCOUNTER — Telehealth: Payer: Self-pay

## 2023-07-25 ENCOUNTER — Other Ambulatory Visit: Payer: Self-pay | Admitting: Pharmacy Technician

## 2023-07-25 VITALS — BP 106/75 | HR 72 | Temp 98.5°F | Resp 18 | Wt 199.5 lb

## 2023-07-25 DIAGNOSIS — Z79899 Other long term (current) drug therapy: Secondary | ICD-10-CM | POA: Insufficient documentation

## 2023-07-25 DIAGNOSIS — K649 Unspecified hemorrhoids: Secondary | ICD-10-CM | POA: Insufficient documentation

## 2023-07-25 DIAGNOSIS — R490 Dysphonia: Secondary | ICD-10-CM | POA: Diagnosis not present

## 2023-07-25 DIAGNOSIS — D649 Anemia, unspecified: Secondary | ICD-10-CM | POA: Insufficient documentation

## 2023-07-25 DIAGNOSIS — E43 Unspecified severe protein-calorie malnutrition: Secondary | ICD-10-CM | POA: Diagnosis not present

## 2023-07-25 DIAGNOSIS — I480 Paroxysmal atrial fibrillation: Secondary | ICD-10-CM | POA: Insufficient documentation

## 2023-07-25 DIAGNOSIS — R634 Abnormal weight loss: Secondary | ICD-10-CM | POA: Diagnosis not present

## 2023-07-25 DIAGNOSIS — C8318 Mantle cell lymphoma, lymph nodes of multiple sites: Secondary | ICD-10-CM

## 2023-07-25 DIAGNOSIS — Z87891 Personal history of nicotine dependence: Secondary | ICD-10-CM | POA: Insufficient documentation

## 2023-07-25 DIAGNOSIS — R197 Diarrhea, unspecified: Secondary | ICD-10-CM | POA: Insufficient documentation

## 2023-07-25 DIAGNOSIS — Z8546 Personal history of malignant neoplasm of prostate: Secondary | ICD-10-CM | POA: Insufficient documentation

## 2023-07-25 DIAGNOSIS — R911 Solitary pulmonary nodule: Secondary | ICD-10-CM | POA: Diagnosis not present

## 2023-07-25 DIAGNOSIS — Z7189 Other specified counseling: Secondary | ICD-10-CM | POA: Diagnosis not present

## 2023-07-25 DIAGNOSIS — D696 Thrombocytopenia, unspecified: Secondary | ICD-10-CM | POA: Insufficient documentation

## 2023-07-25 DIAGNOSIS — E883 Tumor lysis syndrome: Secondary | ICD-10-CM

## 2023-07-25 LAB — CBC WITH DIFFERENTIAL (CANCER CENTER ONLY)
Abs Immature Granulocytes: 0.04 10*3/uL (ref 0.00–0.07)
Basophils Absolute: 0 10*3/uL (ref 0.0–0.1)
Basophils Relative: 1 %
Eosinophils Absolute: 0 10*3/uL (ref 0.0–0.5)
Eosinophils Relative: 1 %
HCT: 31.3 % — ABNORMAL LOW (ref 39.0–52.0)
Hemoglobin: 10.3 g/dL — ABNORMAL LOW (ref 13.0–17.0)
Immature Granulocytes: 1 %
Lymphocytes Relative: 30 %
Lymphs Abs: 1.2 10*3/uL (ref 0.7–4.0)
MCH: 28.1 pg (ref 26.0–34.0)
MCHC: 32.9 g/dL (ref 30.0–36.0)
MCV: 85.5 fL (ref 80.0–100.0)
Monocytes Absolute: 0.4 10*3/uL (ref 0.1–1.0)
Monocytes Relative: 9 %
Neutro Abs: 2.4 10*3/uL (ref 1.7–7.7)
Neutrophils Relative %: 58 %
Platelet Count: 113 10*3/uL — ABNORMAL LOW (ref 150–400)
RBC: 3.66 MIL/uL — ABNORMAL LOW (ref 4.22–5.81)
RDW: 15.3 % (ref 11.5–15.5)
WBC Count: 4 10*3/uL (ref 4.0–10.5)
nRBC: 0 % (ref 0.0–0.2)

## 2023-07-25 LAB — CMP (CANCER CENTER ONLY)
ALT: 11 U/L (ref 0–44)
AST: 29 U/L (ref 15–41)
Albumin: 3.6 g/dL (ref 3.5–5.0)
Alkaline Phosphatase: 61 U/L (ref 38–126)
Anion gap: 4 — ABNORMAL LOW (ref 5–15)
BUN: 35 mg/dL — ABNORMAL HIGH (ref 8–23)
CO2: 26 mmol/L (ref 22–32)
Calcium: 9.2 mg/dL (ref 8.9–10.3)
Chloride: 111 mmol/L (ref 98–111)
Creatinine: 1.27 mg/dL — ABNORMAL HIGH (ref 0.61–1.24)
GFR, Estimated: 58 mL/min — ABNORMAL LOW (ref >60)
Glucose, Bld: 87 mg/dL (ref 70–99)
Potassium: 4 mmol/L (ref 3.5–5.1)
Sodium: 141 mmol/L (ref 135–145)
Total Bilirubin: 0.4 mg/dL (ref 0.3–1.2)
Total Protein: 6.3 g/dL — ABNORMAL LOW (ref 6.5–8.1)

## 2023-07-25 LAB — LACTATE DEHYDROGENASE: LDH: 545 U/L — ABNORMAL HIGH (ref 98–192)

## 2023-07-25 MED ORDER — HEPARIN SOD (PORK) LOCK FLUSH 100 UNIT/ML IV SOLN
500.0000 [IU] | Freq: Once | INTRAVENOUS | Status: AC
  Administered 2023-07-25: 500 [IU]

## 2023-07-25 MED ORDER — SODIUM CHLORIDE 0.9% FLUSH
10.0000 mL | Freq: Once | INTRAVENOUS | Status: AC
  Administered 2023-07-25: 10 mL

## 2023-07-25 MED ORDER — PIRTOBRUTINIB 100 MG PO TABS
200.0000 mg | ORAL_TABLET | Freq: Every day | ORAL | 1 refills | Status: DC
  Filled 2023-07-30: qty 60, 30d supply, fill #0

## 2023-07-25 NOTE — Progress Notes (Signed)
Oral Oncology Patient Advocate Encounter   Received notification that prior authorization for Jaypirca is required.   PA submitted on 07/25/23 Key [id number] Status is pending     Jinger Neighbors, CPhT-Adv Oncology Pharmacy Patient Advocate [facility] Direct Number: [phone]  Fax: [phone]

## 2023-07-25 NOTE — Progress Notes (Signed)
Oral Oncology Patient Advocate Encounter  Prior Authorization for Joyice Faster has been approved.    PA#  [id number] Effective dates: 07/25/23 through 10/30/23  Patients co-pay is $0.    Jinger Neighbors, CPhT-Adv Oncology Pharmacy Patient Advocate [facility] Direct Number: [phone]  Fax: [phone]

## 2023-07-25 NOTE — Progress Notes (Signed)
HEMATOLOGY/ONCOLOGY CLINIC VISIT NOTE  Date of Service: 07/25/23  Patient Care Team: [facility] as PCP - General (General Practice)  CHIEF COMPLAINTS/PURPOSE OF CONSULTATION:  Follow-up for continued evaluation and management of relapsed mantle cell lymphoma  HISTORY OF PRESENTING ILLNESS:  Please see previous note for details of initial presentation  INTERVAL HISTORY  Jerry Tran is here for continued evaluation and management of his relapsed mantle cell lymphoma.   Patient was last seen by me on 06/29/2023 and complained of bilateral inguinal swelling and bilateral lower extremity swelling. He reported that he had gained 6 pounds in the last month.  Today, he is accompanied by his wife and reports feeling well overall. Patient's wife reports that patient has not been taking the increased dose of Ibrutinib as prescribed due to misreading instructions. He began taking the increased dose of ibrutinib last week and he was taking two tablets at a time.   He complains of large amounts of yellow nasal secretions. He denies any pain over the sinus. He notes that this symptoms started since starting the new medication. He denies any sore throat or fever. No previous sinus infections. His nasal secretions are improving at this time.   He reports a new mass in his left forearm which he initially noticed 3 weeks ago. He reports another nodule near his left elbow.  Patient notes that the swelling was previously fluctuating 1-2 weeks ago. However, it is persistently swollen at this time. His forearm pain is intermittent and the nodule is not painful on palpation.   Patient reports that the nodule in his groin has increased in size. He also reports a lymph node in his left axillary. Patient has not connected with a radiation oncologist yet.   He complains of a painful burning sensation in his right forearm, which began a couple days ago.   Patient has normal breathing habits and  denies any SOB, chest pain, abdominal pain. He does endorse mild leg swelling.   Patient has gained 10 pounds since 05/24/2023 and his current weight is 199 pounds. He stays well hydrated.   MEDICAL HISTORY:  Past Medical History:  Diagnosis Date   Arthritis    Chronic kidney disease, stage 3b (HCC) 12/09/2021   Dyslipidemia    GERD (gastroesophageal reflux disease)    Hypertension    PAF (paroxysmal atrial fibrillation) (HCC)    Prostate cancer (HCC) 03/2021   s/p radiation therapy   PTSD (post-traumatic stress disorder)     SURGICAL HISTORY: Past Surgical History:  Procedure Laterality Date   IR IMAGING GUIDED PORT INSERTION  11/06/2022   LIPOMA EXCISION Right 12/09/2021   Procedure: RIGHT SUPERCLAVICULAR LYMPH NODE EXCISION;  Surgeon: Andria Meuse, MD;  Location: [facility] OR;  Service: General;  Laterality: Right;    SOCIAL HISTORY: Social History   Socioeconomic History   Marital status: Married    Spouse name: Not on file   Number of children: Not on file   Years of education: Not on file   Highest education level: Not on file  Occupational History   Occupation: retired  Tobacco Use   Smoking status: Former    Current packs/day: 1.00    Average packs/day: 1 pack/day for 15.0 years (15.0 ttl pk-yrs)    Types: Cigarettes   Smokeless tobacco: Never  Substance and Sexual Activity   Alcohol use: Not Currently    Comment: h/o heavy use   Drug use: Not Currently    Types: Marijuana, Cocaine  Comment: remote   Sexual activity: Not on file  Other Topics Concern   Not on file  Social History Narrative   Not on file   Social Determinants of Health   Financial Resource Strain: Not on file  Food Insecurity: No Food Insecurity (04/03/2023)   Hunger Vital Sign    Worried About Running Out of Food in the Last Year: Never true    Ran Out of Food in the Last Year: Never true  Transportation Needs: No Transportation Needs (04/03/2023)   PRAPARE - Scientist, research (physical sciences) (Medical): No    Lack of Transportation (Non-Medical): No  Physical Activity: Not on file  Stress: Not on file  Social Connections: Not on file  Intimate Partner Violence: Not At Risk (04/03/2023)   Humiliation, Afraid, Rape, and Kick questionnaire    Fear of Current or Ex-Partner: No    Emotionally Abused: No    Physically Abused: No    Sexually Abused: No    FAMILY HISTORY: Family History  Problem Relation Age of Onset   Cancer Neg Hx     ALLERGIES:  is allergic to sildenafil.  MEDICATIONS:  Current Outpatient Medications  Medication Sig Dispense Refill   ammonium lactate (LAC-HYDRIN) 12 % lotion Apply 1 application topically 2 (two) times daily as needed for dry skin.     atorvastatin (LIPITOR) 20 MG tablet Take 10 mg by mouth at bedtime.     Cholecalciferol (VITAMIN D3 PO) Take 1 tablet by mouth every morning.     diclofenac Sodium (VOLTAREN) 1 % GEL Apply 2 g topically 4 (four) times daily as needed (pain).     feeding supplement (ENSURE ENLIVE / ENSURE PLUS) LIQD Take 237 mLs by mouth 2 (two) times daily between meals. 237 mL 12   flecainide (TAMBOCOR) 100 MG tablet Take 100 mg by mouth every 12 (twelve) hours.     fluticasone (FLONASE) 50 MCG/ACT nasal spray Place 1 spray into both nostrils daily as needed for allergies or rhinitis.     furosemide (LASIX) 20 MG tablet Take 20 mg by mouth.     hydrocortisone (ANUSOL-HC) 25 MG suppository Place 1 suppository (25 mg total) rectally 2 (two) times daily. 12 suppository 0   hydroxypropyl methylcellulose / hypromellose (ISOPTO TEARS / GONIOVISC) 2.5 % ophthalmic solution Place 2 drops into both eyes 2 (two) times daily as needed for dry eyes.     ibrutinib (IMBRUVICA) 280 MG tablet Take 2 tablets (560 mg Total) daily.  Take with a glass of water. 56 tablet 1   LORazepam (ATIVAN) 0.5 MG tablet Take 1 tablet (0.5 mg total) by mouth every 6 (six) hours as needed (Nausea or vomiting). 30 tablet 0   magic mouthwash  (multi-ingredient) oral suspension Take 5 mLs by mouth 4 times daily as needed for mouth and throat pain. 400 mL 1   magic mouthwash w/lidocaine SOLN Take 5 mLs by mouth 4 (four) times daily as needed for mouth pain (mouth and throat pain related to radiation). Compound 400 ml total volume with  80ml of Maalox 80ml of Nystatin 100,000 unit/ml 80ml of 2% viscous lidocaine 80ml of Diphenhydramine 12.5mg /75ml 80ml of Distilled water. 400 mL 1   metoprolol tartrate (LOPRESSOR) 25 MG tablet Take 12.5 mg by mouth 2 (two) times daily. Take with flecainide     mirtazapine (REMERON) 30 MG tablet Take 30 mg by mouth at bedtime.     nystatin (MYCOSTATIN) 100000 UNIT/ML suspension Take 5 mLs (500,000 Units  total) by mouth 4 (four) times daily. 200 mL 2   omeprazole (PRILOSEC) 20 MG capsule Take 20 mg by mouth daily before breakfast.     potassium chloride SA (KLOR-CON M) 20 MEQ tablet TAKE 1 TABLET(20 MEQ) BY MOUTH TWICE DAILY 180 tablet 2   prazosin (MINIPRESS) 2 MG capsule Take 6 mg by mouth at bedtime.     sucralfate (CARAFATE) 1 GM/10ML suspension Take 10 mLs by mouth 4 times daily -  with meals and at bedtime. 420 mL 0   terbinafine (LAMISIL) 1 % cream Apply 1 application topically 2 (two) times daily as needed (athlete's foot).     torsemide (DEMADEX) 10 MG tablet Take 20 mg by mouth daily.     triamcinolone cream (KENALOG) 0.1 % Apply 1 application topically 2 (two) times daily as needed (rash).     No current facility-administered medications for this visit.    REVIEW OF SYSTEMS:    10 Point review of Systems was done is negative except as noted above.   PHYSICAL EXAMINATION: .BP 106/75 (BP Location: Left Arm, Patient Position: Sitting)   Pulse 72   Temp 98.5 F (36.9 C) (Oral)   Resp 18   Wt 199 lb 8 oz (90.5 kg)   SpO2 100%   BMI 24.28 kg/m   GENERAL:alert, in no acute distress and comfortable SKIN: no acute rashes, no significant lesions EYES: conjunctiva are pink and  non-injected, sclera anicteric OROPHARYNX: MMM, no exudates, no oropharyngeal erythema or ulceration NECK: supple, no JVD LYMPH:  no palpable lymphadenopathy in the cervical, axillary or inguinal regions LUNGS: clear to auscultation b/l with normal respiratory effort HEART: regular rate & rhythm ABDOMEN:  normoactive bowel sounds , non tender, not distended. Extremity: no pedal edema PSYCH: alert & oriented x 3 with fluent speech NEURO: no focal motor/sensory deficits   LABORATORY DATA:  I have reviewed the data as listed     Latest Ref Rng & Units 07/25/2023   11:32 AM 06/29/2023    9:37 AM 05/30/2023    1:06 PM  CBC  WBC 4.0 - 10.5 K/uL 4.0  3.6  3.6   Hemoglobin 13.0 - 17.0 g/dL 33.2  95.1  88.4   Hematocrit 39.0 - 52.0 % 31.3  32.5  31.3   Platelets 150 - 400 K/uL 113  120  126       Latest Ref Rng & Units 07/25/2023   11:32 AM 06/29/2023    9:37 AM 05/30/2023    1:06 PM  CMP  Glucose 70 - 99 mg/dL 87  84  76   BUN 8 - 23 mg/dL 35  32  32   Creatinine 0.61 - 1.24 mg/dL 1.66  0.63  0.16   Sodium 135 - 145 mmol/L 141  139  139   Potassium 3.5 - 5.1 mmol/L 4.0  3.8  4.0   Chloride 98 - 111 mmol/L 111  108  108   CO2 22 - 32 mmol/L 26  26  27    Calcium 8.9 - 10.3 mg/dL 9.2  9.2  9.3   Total Protein 6.5 - 8.1 g/dL 6.3  6.3  6.1   Total Bilirubin 0.3 - 1.2 mg/dL 0.4  0.4  0.3   Alkaline Phos 38 - 126 U/L 61  52  50   AST 15 - 41 U/L 29  20  19    ALT 0 - 44 U/L 11  10  10     Lab Results  Component Value Date  LDH 545 (H) 07/25/2023    Surgical Pathology 10/16/2022: A. 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    RADIOGRAPHIC STUDIES: I have personally reviewed the radiological images as listed and agreed with the findings in the report.  CT Chest/abd/pelvis 7/30:  IMPRESSION: 1. Interval enlargement of previously demonstrated bilateral inguinal adenopathy consistent with progressive lymphoma. 2. New mildly enlarged right axillary lymph node and right external iliac lymph node, also suspicious for lymphoma. 3. New small right lower lobe pulmonary nodule, possibly a lymph node. Recommend attention on follow-up. 4. No other evidence of lymphoma in the chest, abdomen or pelvis. 5. Stable incidental findings including small pericardial effusion, hepatic and renal cysts and mild lumbar spondylosis. Mildly prominent stool throughout the colon suggesting constipation. 6.  Aortic Atherosclerosis (ICD10-I70.0).     Electronically Signed   By: Carey Bullocks M.D.   On: 05/29/2023 12:55   ASSESSMENT & PLAN:   77 y.o.  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  3) moderate to severe protein calorie malnutrition-He has lost 8lbs but is drinking x2-3 Ensure drinks per week.  Wt Readings from Last 3 Encounters:  06/29/23 195 lb 1.6 oz (88.5 kg)  05/24/23 189 lb 6.4 oz (85.9 kg)  05/18/23 183 lb 2 oz (83.1 kg)   4) Loose stools- He is having a few small, loose stools per day.  5) BLE swelling- resolved with compression socks.  6) Hemorrhoids- He reports pain with his hemorrhoid flare but denies any  rectal bleeding. He has not seen GI or surgeon for this problem, but [facility]   7) Hoarse voice- He reports more difficulty projecting his voice. Denies any noticeable reflux but he does take acid suppression medication. He denies any significant seasonal allergies.  PLAN:  -Discussed lab results on 07/25/23  in detail with patient. CBC showed WBC of 4.0K, hemoglobin of 10.3, and platelets slightly low at 113K. -mild anemia -CMP fairly stable. Findings suggest mild dehydration -continue to stay well hydrated -there is concern for mantle cell lymphoma progression due to factors including mass and nodule in left arm, as well as axillary lymph nodes -patient has not been taking Ibrutinib as prescribed due to misreading instructions -Continue Ibrutinib at full dose of 560 MG at this time -will switch to pirtobrutinib and also refer him for consideration of palliative radiation therapy for symptomatic areas of involvement. -Given aggressive behavior patients MCL will refer patient to lymphoma specialist  (Dr Alphonsa Gin or Truddie Crumble) at [facility] to receive an additional opinion regarding proceeding options such as CAR T-cell therapies or other clinical trials.  -Will connect with Dr. Basilio Cairo to consider radiation therapy to his b/l inguinal /iliac LN and  symptomatic disease in arm and axillae and based on PET/40CT -advised patient to let us know if he does not hear from radiation oncology or [facility] in the next week  -advised patient to inform us if he develops a fever, sinus infection or sore throat, which may require antibiotics -recommend sterile saline nasal spray and steam inhalation to improve nasal secretions. Claritin use would be okay. -do not believe nasal secretions are related to medication.  -will plan to repeat PET scan  FOLLOW-UP: PET/CT in 5-7 days Referral to radiation oncology for consideration of palliative radiation for progressive mantle  cell lymphoma in bilateral groins left arm and bilateral axillae. Referral to Dr. Seegar/Dr. Alphonsa Gin [facility] for additional opinion about continued treatment of the patient's mantle cell lymphoma. Return to clinic with Dr. Candise Che as scheduled 08/24/2023 with next dose of Rituxan.  The total time spent in the appointment was 40 minutes* .  All of the patient's questions were answered with apparent satisfaction. The patient knows to call the clinic with any problems, questions or concerns.   Wyvonnia Lora MD MS AAHIVMS [facility] Hematology/Oncology Physician [facility]  .*Total Encounter Time as defined by the Centers for Medicare and Medicaid Services includes, in addition to the face-to-face time of a patient visit (documented in the note above) non-face-to-face time: obtaining and reviewing outside history, ordering and reviewing medications, tests or procedures, care coordination (communications with other health care professionals or caregivers) and documentation in the medical record.    I,Mitra Faeizi,acting as a Neurosurgeon for Wyvonnia Lora, MD.,have documented all relevant documentation on the behalf of Wyvonnia Lora, MD,as directed by  Wyvonnia Lora, MD while in the presence of Wyvonnia Lora, MD.  .I have reviewed the above documentation for accuracy and completeness, and I agree with the above. [name] MD

## 2023-07-25 NOTE — Telephone Encounter (Unsigned)
Oral Oncology Pharmacist Encounter  Received new prescription for Jaypirca (pirtobrutinib) for the treatment of relapsed mantle cell lymphoma, planned duration until disease progression or unacceptable toxicity.  Labs from 07/24/24 (CBC and CMP) assessed, no dose adjustments needed. Prescription dose and frequency assessed for appropriateness.   Current medication list in Epic reviewed, DDIs with Jaypirca identified: - Omeprazole (Cat B): Joyice Faster is a weak CYP2C19 inhibitor which can increase the concentration of omeprazole. Patient is not on the highest dose and no interventions are needed.   Evaluated chart and no patient barriers to medication adherence noted.   Patient agreement for treatment documented in MD note on 07/25/2023.  Prescription has been e-scribed to the [facility] for benefits analysis and approval.  Oral Oncology Clinic will continue to follow for insurance authorization, copayment issues, initial counseling and start date.  Bethel Born, PharmD Hematology/Oncology Clinical Pharmacist [facility] Oral Chemotherapy Navigation Clinic [phone] 07/25/2023 1:11 PM

## 2023-07-30 ENCOUNTER — Other Ambulatory Visit: Payer: Self-pay

## 2023-07-30 ENCOUNTER — Other Ambulatory Visit (HOSPITAL_COMMUNITY): Payer: Self-pay

## 2023-07-30 ENCOUNTER — Telehealth: Payer: Self-pay | Admitting: Radiation Oncology

## 2023-07-30 MED ORDER — PIRTOBRUTINIB 100 MG PO TABS
200.0000 mg | ORAL_TABLET | Freq: Every day | ORAL | 1 refills | Status: DC
  Filled 2023-07-30: qty 60, 30d supply, fill #0
  Filled 2023-08-28: qty 60, 30d supply, fill #1

## 2023-07-30 NOTE — Telephone Encounter (Addendum)
Oral Chemotherapy Pharmacist Encounter  I spoke with patient for overview of: Jaypirca (pirtobrutinib) for the treatment of mantle cell lymphoma, planned duration until disease progression or unacceptable toxicity.   Counseled patient on administration, dosing, side effects, monitoring, drug-food interactions, safe handling, storage, and disposal.  Patient will take Jaypirca 100mg  tablets, 2 tablets (200mg ) by mouth once daily.   Patient knows to avoid grapefruit or grapefruit juice while on therapy with Imbruvica.  Jaypirca start date: 08/02/2023  Adverse effects include but are not limited to: decreased blood counts, diarrhea, bruising, musculoskeletal pain, arthralgias, peripheral edema, changes in kidney function, and hemorrhage.    Patient will stop the Imbruvica on Wednesday, 08/01/23 prior to starting the Jaypirca.   Reviewed with patient importance of keeping a medication schedule and plan for any missed doses. No barriers to medication adherence identified.  Medication reconciliation performed and medication/allergy list updated.  Insurance authorization for Maurine Simmering has been obtained. Test claim at the pharmacy revealed copayment $0 for 1st fill of 30 days. Patient will pick up the Jaypirca from [facility] on 08/01/2023.   Discussed with MD that prescription should be written for qty of 60 not qty 30 so that patient can have a full 30 day supply. Modified prescription to a qty of 60.   Patient informed the pharmacy will reach out 5-7 days prior to needing next fill of Jaypirca to coordinate continued medication acquisition to prevent break in therapy.  All questions answered.  Patient voiced understanding and appreciation.   Medication education handout placed in mail for patient. Patient knows to call the office with questions or concerns. Oral Chemotherapy Clinic phone number provided to patient.   Bethel Born, PharmD Hematology/Oncology  Clinical Pharmacist [facility] Oral Chemotherapy Navigation Clinic [phone] 07/30/2023   12:22 PM

## 2023-07-30 NOTE — Progress Notes (Signed)
Oral Oncology Pharmacist Encounter  Patient counseling performed in encounter from 07/25/23.  Bethel Born, PharmD Hematology/Oncology Clinical Pharmacist [facility] Oral Chemotherapy Navigation Clinic [phone]

## 2023-07-30 NOTE — Addendum Note (Signed)
Addended by: Belinda Block on: 07/30/2023 01:10 PM   Modules accepted: Orders

## 2023-07-30 NOTE — Telephone Encounter (Signed)
LVM to main number, secondary phone number not in service.

## 2023-07-30 NOTE — Progress Notes (Signed)
Specialty Pharmacy Initial Fill Coordination Note  Jerry Tran is a 77 y.o. male contacted today regarding refills of specialty medication(s) Pirtobrutinib .  Patient requested [name] at [facility] at [place]  on 08/01/23  Medication will need to be ordered on 07/30/23. Medication will be filled on 07/31/23.   Patient is aware of $0 copayment.

## 2023-07-31 ENCOUNTER — Telehealth: Payer: Self-pay | Admitting: Radiation Oncology

## 2023-07-31 ENCOUNTER — Encounter: Payer: Self-pay | Admitting: Hematology

## 2023-07-31 ENCOUNTER — Other Ambulatory Visit (HOSPITAL_COMMUNITY): Payer: Self-pay

## 2023-07-31 NOTE — Telephone Encounter (Signed)
LVM to schedule RECON with Dr. Isidore Moos.  ?

## 2023-08-01 ENCOUNTER — Other Ambulatory Visit: Payer: Self-pay

## 2023-08-01 ENCOUNTER — Other Ambulatory Visit (HOSPITAL_COMMUNITY): Payer: Self-pay

## 2023-08-01 NOTE — Progress Notes (Signed)
Lymphoma Location(s) / Histology: mantle cell lymphoma,   b/l inguinal /iliac LN and symptomatic disease in arm and axillae and based on PET/40CT   NM PET Image Restag (PS) Skull Base To Thigh 03/16/2023  IMPRESSION: 1. Interval progression of lymphoma as evidenced by enlarging and increasingly hypermetabolic nasopharyngeal and bilateral tonsillar masses as well as inguinal adenopathy. 2.  Aortic atherosclerosis (ICD10-I70.0).   CT CHEST ABDOMEN PELVIS W CONTRAST 05/29/2023  IMPRESSION: 1. Interval enlargement of previously demonstrated bilateral inguinal adenopathy consistent with progressive lymphoma. 2. New mildly enlarged right axillary lymph node and right external iliac lymph node, also suspicious for lymphoma. 3. New small right lower lobe pulmonary nodule, possibly a lymph node. Recommend attention on follow-up. 4. No other evidence of lymphoma in the chest, abdomen or pelvis. 5. Stable incidental findings including small pericardial effusion, hepatic and renal cysts and mild lumbar spondylosis. Mildly prominent stool throughout the colon suggesting constipation. 6.  Aortic Atherosclerosis (ICD10-I70.0).   Sunday Spillers presented with symptoms of:  Patient was last seen by me on 06/29/2023 and complained of bilateral inguinal swelling and bilateral lower extremity swelling. He reported that he had gained 6 pounds in the last month.   He complains of large amounts of yellow nasal secretions. He denies any pain over the sinus. He notes that this symptoms started since starting the new medication. He denies any sore throat or fever. No previous sinus infections. His nasal secretions are improving at this time.    He reports a new mass in his left forearm which he initially noticed 3 weeks ago. He reports another nodule near his left elbow.  Patient notes that the swelling was previously fluctuating 1-2 weeks ago. However, it is persistently swollen at this time. His forearm pain is  intermittent and the nodule is not painful on palpation.    Patient reports that the nodule in his groin has increased in size. He also reports a lymph node in his left axillary. Patient has not connected with a radiation oncologist yet.    He complains of a painful burning sensation in his right forearm, which began a couple days ago.    Patient has normal breathing habits and denies any SOB, chest pain, abdominal pain. He does endorse mild leg swelling.    Patient has gained 10 pounds since 05/24/2023 and his current weight is 199 pounds. He stays well hydrated.    Biopsies (if applicable) revealed:  10/16/2022  FINAL MICROSCOPIC DIAGNOSIS:   A. 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   GATING AND PHENOTYPIC ANALYSIS:  Gated population: Flow cytometric immunophenotyping is performed using antibodies to the antigens listed in the table below. Electronic gates are placed around a cell cluster displaying light scatter properties corresponding to: lymphocytes  Abnormal Cells in gated population: 95%  Phenotype of Abnormal Cells: CD5, CD19, CD20, CD38, Kappa                        Lymphoid Antigens       Myeloid Antigens Miscellaneous CD2  NEG  CD10 NEG  CD11b     ND   CD45 POS CD3  NEG  CD19 POS  CD11c  ND   HLA-Dr    ND CD4  NEG  CD20 POS  CD13 ND   CD34 NEG CD5  POS  CD22 ND   CD14 ND   CD38 POS CD7  NEG  CD79b     ND   CD15 ND   CD138     ND CD8  NEG  CD103     ND   CD16 ND   TdT  ND CD25 ND   CD200     NEG  CD33 ND   CD123     ND TCRab     ND   sKappa    POS  CD64 ND   CD41 ND TCRgd     NEG  sLambda   NEG  CD117      ND   CD61 ND CD56 NEG  cKappa    ND   MPO  ND   CD71 ND CD57 ND   cLambda   ND        CD235aND  Past/Anticipated interventions by medical oncology, if any:  [name], MD 07/25/2023  INTERVAL HISTORY   Mr. Timberman is here for continued evaluation and management of his relapsed mantle cell lymphoma.    Patient was last seen by me on 06/29/2023 and complained of bilateral inguinal swelling and bilateral lower extremity swelling. He reported that he had gained 6 pounds in the last month.   Today, he is accompanied by his wife and reports feeling well overall. Patient's wife reports that patient has not been taking the increased dose of Ibrutinib as prescribed due to misreading instructions. He began taking the increased dose of ibrutinib last week and he was taking two tablets at a time.    He complains of large amounts of yellow nasal secretions. He denies any pain over the sinus. He notes that this symptoms started since starting the new medication. He denies any sore throat or fever. No previous sinus infections. His nasal secretions are improving at this time.    He reports a new mass in his left forearm which he initially noticed 3 weeks ago. He reports another nodule near his left elbow.  Patient notes that the swelling was previously fluctuating 1-2 weeks ago. However, it is persistently swollen at this time. His forearm pain is intermittent and the nodule is not painful on palpation.    Patient reports that the nodule in his groin has increased in size. He also reports a lymph node in his left axillary. Patient has not connected with a radiation oncologist yet.    He complains of a painful burning sensation in his right forearm, which began a couple days ago.    Patient has normal breathing habits and denies any SOB, chest pain, abdominal pain. He does endorse mild leg swelling.    Patient has gained 10 pounds since 05/24/2023 and his current weight is 199 pounds. He stays well  hydrated.   ASSESSMENT & PLAN:    77 y.o.  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   3) moderate to severe protein  calorie malnutrition-He has lost 8lbs but is drinking x2-3 Ensure drinks per week.     Wt Readings from Last 3 Encounters:  06/29/23 195 lb 1.6 oz (88.5 kg)  05/24/23 189 lb 6.4 oz (85.9 kg)  05/18/23 183 lb 2 oz (83.1 kg)    4) Loose stools- He is having a few small, loose stools per day.   5) BLE swelling- resolved with compression socks.   6) Hemorrhoids- He reports pain with his hemorrhoid flare but denies any rectal bleeding. He has not seen GI or surgeon for this problem, but [facility]    7) Hoarse voice- He reports more difficulty projecting his voice. Denies any noticeable reflux but he does take acid suppression medication. He denies any significant seasonal allergies.   PLAN:   -Discussed lab results on 07/25/23  in detail with patient. CBC showed WBC of 4.0K, hemoglobin of 10.3, and platelets slightly low at 113K. -mild anemia -CMP fairly stable. Findings suggest mild dehydration -continue to stay well hydrated -there is concern for mantle cell lymphoma progression due to factors including mass and nodule in left arm, as well as axillary lymph nodes -patient has not been taking Ibrutinib as prescribed due to misreading instructions -Continue Ibrutinib at full dose of 560 MG at this time -will switch to pirtobrutinib and also refer him for  consideration of palliative radiation therapy for symptomatic areas of involvement. -Given aggressive behavior patients MCL will refer patient to lymphoma specialist  (Dr Alphonsa Gin or Truddie Crumble) at [facility] to receive an additional opinion regarding proceeding options such as CAR T-cell therapies or other clinical trials.  -Will connect with Dr. Basilio Cairo to consider radiation therapy to his b/l inguinal /iliac LN and symptomatic disease in arm and axillae and based on PET/40CT -advised patient to let us know if he does not hear from radiation oncology or [facility] in the next week  -advised patient to inform us if he develops a fever, sinus infection or sore throat, which may require antibiotics -recommend sterile saline nasal spray and steam inhalation to improve nasal secretions. Claritin use would be okay. -do not believe nasal secretions are related to medication.  -will plan to repeat PET scan   FOLLOW-UP: PET/CT in 5-7 days Referral to radiation oncology for consideration of palliative radiation for progressive mantle cell lymphoma in bilateral groins left arm and bilateral axillae. Referral to Dr. Seegar/Dr. Alphonsa Gin [facility] for additional opinion about continued treatment of the patient's mantle cell lymphoma. Return to clinic with Dr. Candise Che as scheduled 08/24/2023   Weight changes, if any, over the past 6 months:  Patient has gained 10 pounds since 05/24/2023 and his current weight is 199 pounds. He stays well hydrated.   Recurrent fevers, or drenching night sweats, if any: no  SAFETY ISSUES: Prior radiation? yes Pacemaker/ICD? no Possible current pregnancy? no Is the patient on methotrexate? no  Current Complaints / other details:  Pt has had swollen lymph nodes as well as burning and itching skin in bilateral arms. Swelling goes up and down. Hoping for same success as with head and neck radiation.   Vitals:   08/08/23 1445  BP: 116/72  Pulse: 72  Resp: 18  Temp: (!) 97 F  (36.1 C)  SpO2: 99%

## 2023-08-02 ENCOUNTER — Other Ambulatory Visit (HOSPITAL_COMMUNITY): Payer: Medicare Other

## 2023-08-03 ENCOUNTER — Ambulatory Visit (HOSPITAL_COMMUNITY)
Admission: RE | Admit: 2023-08-03 | Discharge: 2023-08-03 | Disposition: A | Discharge status: Home / Self Care | Payer: Medicare Other | Source: Ambulatory Visit | Attending: Hematology

## 2023-08-03 DIAGNOSIS — C8318 Mantle cell lymphoma, lymph nodes of multiple sites: Secondary | ICD-10-CM | POA: Diagnosis present

## 2023-08-03 LAB — GLUCOSE, CAPILLARY: Glucose-Capillary: 97 mg/dL (ref 70–99)

## 2023-08-03 MED ORDER — FLUDEOXYGLUCOSE F - 18 (FDG) INJECTION
9.5000 | Freq: Once | INTRAVENOUS | Status: AC
  Administered 2023-08-03: 10.04 via INTRAVENOUS

## 2023-08-08 ENCOUNTER — Ambulatory Visit
Admission: RE | Admit: 2023-08-08 | Discharge: 2023-08-08 | Disposition: A | Discharge status: Home / Self Care | Payer: Medicare Other | Source: Ambulatory Visit | Attending: Radiation Oncology | Admitting: Radiation Oncology

## 2023-08-08 ENCOUNTER — Encounter: Payer: Self-pay | Admitting: Radiation Oncology

## 2023-08-08 VITALS — BP 116/72 | HR 72 | Temp 97.0°F | Resp 18 | Ht 76.0 in | Wt 204.2 lb

## 2023-08-08 DIAGNOSIS — N1832 Chronic kidney disease, stage 3b: Secondary | ICD-10-CM | POA: Diagnosis not present

## 2023-08-08 DIAGNOSIS — E785 Hyperlipidemia, unspecified: Secondary | ICD-10-CM | POA: Diagnosis not present

## 2023-08-08 DIAGNOSIS — Z87891 Personal history of nicotine dependence: Secondary | ICD-10-CM | POA: Insufficient documentation

## 2023-08-08 DIAGNOSIS — Z79899 Other long term (current) drug therapy: Secondary | ICD-10-CM | POA: Diagnosis not present

## 2023-08-08 DIAGNOSIS — I129 Hypertensive chronic kidney disease with stage 1 through stage 4 chronic kidney disease, or unspecified chronic kidney disease: Secondary | ICD-10-CM | POA: Insufficient documentation

## 2023-08-08 DIAGNOSIS — I890 Lymphedema, not elsewhere classified: Secondary | ICD-10-CM | POA: Diagnosis not present

## 2023-08-08 DIAGNOSIS — K219 Gastro-esophageal reflux disease without esophagitis: Secondary | ICD-10-CM | POA: Diagnosis not present

## 2023-08-08 DIAGNOSIS — Z923 Personal history of irradiation: Secondary | ICD-10-CM | POA: Diagnosis not present

## 2023-08-08 DIAGNOSIS — C8318 Mantle cell lymphoma, lymph nodes of multiple sites: Secondary | ICD-10-CM | POA: Insufficient documentation

## 2023-08-08 DIAGNOSIS — I480 Paroxysmal atrial fibrillation: Secondary | ICD-10-CM | POA: Diagnosis not present

## 2023-08-08 NOTE — Progress Notes (Signed)
Radiation Oncology         [phone] ________________________________  Outpatient Re-Consultation  Name: Jerry Tran MRN: [id number]  Date: 08/08/2023  DOB: 1946/04/30  NW:GNFAOZ, [name]  [place], Corene Cornea, MD   REFERRING PHYSICIAN: [name], MD  DIAGNOSIS:    ICD-10-CM   1. 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)  C83.18      Metastastic Disease- Recent progression of mantle cell lymphoma involving the groin, left arm, and bilateral axillae.  CHIEF COMPLAINT: Here to discuss management of mantle cell lymphoma   Patient provided consent to use AI ABRIDGE recording technology  for documentation  HISTORY OF PRESENT ILLNESS::Jerry Tran is a 77 y.o. male who presents today to discuss the role of palliative radiation therapy in management of his recent progression of mantle cell lymphoma. He was last seen here for follow-up on 05/18/23 for follow-up of radiation to the lymphoma in his nasopharynx. He had some mild taste changes from radiation but otherwise tolerated treatment well. He has also also continued to take Ibrutinib (daily) and receive rituxan q2 months.   Since his last visit, the patient followed up with medical oncology on 05/24/23. Exam performed at that time revealed multiple enlarged non-tender immobile axillary and inguinal lymph nodes.   A CT CAP with contrast was subsequently performed on 05/29/23 which demonstrated: interval enlargement of the previously seen bilateral inguinal adenopathy, consistent with progressive lymphoma; a new mildly enlarged right axillary lymph node and right external iliac lymph node also suspicious for lymphoma; and a new small right lower lobe pulmonary nodule possibly representing a lymph node. No other sites concerning for lymphoma were seen in the chest, abdomen, or pelvis.   CT results were reviewed by Dr. Leonides Schanz who ultimately recommended continued observation since he only recently  started Ibrutinib at that time. He also followed up with Dr. Candise Che on 06/29/23 who recommended changing ibrutinib to pirtobrutinib. With regards to the limited sites of progression, Dr. Candise Che also discussed the role of palliative ISRT to the bilateral pelvic and inguinal lymph nodes at that time.   During a visit with Dr. Candise Che on 07/25/23, the patient also reported noticing a new mass to his left forearm (associated with intermitted forearm pain and burning), another new nodule near his left elbow, and an increase in size of the nodule in his groin.   At this time, Dr. Candise Che recommends radiation therapy to the progressive mantle cell lymphoma to the groin bilaterally, left arm, and bilateral axillae. Dr. Candise Che has also placed a referral to Dr. Seegar/Dr. Alphonsa Gin at [facility] for an additional opinion regarding systemic treatment.    Other pertinent imaging performed thus far includes a PET scan on 08/03/23. Results are pending at this time but I personally reviewed his imaging with him which confirms multiple masses in various parts of their body including the upper left arm, forearm, axillae, and b/l groin. The patient reports occasional soreness in these areas, but states that the pain has significantly reduced since starting on a new medication, pirtobrutinib. The patient has previously received radiation treatment for a tumor in the nasopharynx, which has shown significant improvement on imaging.   The patient also reports new swelling in the lower legs, which is suspected to be lymphedema from his groin masses.  PREVIOUS RADIATION THERAPY: Yes   Radiation treatment dates: 04/11/23 through 04/24/23  Site: Nasopharynx Technique: IMRT Mode: Photon Dose Per Fraction: 2.5 Gy Prescribed Dose (Delivered / Prescribed): 25 Gy /  25 Gy Prescribed Fxs (Delivered / Prescribed): 10 / 10  PAST MEDICAL HISTORY:  has a past medical history of Arthritis, Chronic kidney disease, stage 3b (HCC) (12/09/2021),  Dyslipidemia, GERD (gastroesophageal reflux disease), Hypertension, PAF (paroxysmal atrial fibrillation) (HCC), Prostate cancer (HCC) (03/2021), and PTSD (post-traumatic stress disorder).    PAST SURGICAL HISTORY: Past Surgical History:  Procedure Laterality Date   IR IMAGING GUIDED PORT INSERTION  11/06/2022   LIPOMA EXCISION Right 12/09/2021   Procedure: RIGHT SUPERCLAVICULAR LYMPH NODE EXCISION;  Surgeon: Andria Meuse, MD;  Location: [facility] OR;  Service: General;  Laterality: Right;    FAMILY HISTORY: family history is not on file.  SOCIAL HISTORY:  reports that he has quit smoking. His smoking use included cigarettes. He has a 15 pack-year smoking history. He has never used smokeless tobacco. He reports that he does not currently use alcohol. He reports that he does not currently use drugs after having used the following drugs: Marijuana and Cocaine.  ALLERGIES: Sildenafil  MEDICATIONS:  Current Outpatient Medications  Medication Sig Dispense Refill   ammonium lactate (LAC-HYDRIN) 12 % lotion Apply 1 application topically 2 (two) times daily as needed for dry skin.     atorvastatin (LIPITOR) 20 MG tablet Take 10 mg by mouth at bedtime.     Cholecalciferol (VITAMIN D3 PO) Take 1 tablet by mouth every morning.     diclofenac Sodium (VOLTAREN) 1 % GEL Apply 2 g topically 4 (four) times daily as needed (pain).     feeding supplement (ENSURE ENLIVE / ENSURE PLUS) LIQD Take 237 mLs by mouth 2 (two) times daily between meals. 237 mL 12   flecainide (TAMBOCOR) 100 MG tablet Take 100 mg by mouth every 12 (twelve) hours.     fluticasone (FLONASE) 50 MCG/ACT nasal spray Place 1 spray into both nostrils daily as needed for allergies or rhinitis.     furosemide (LASIX) 20 MG tablet Take 20 mg by mouth.     hydrocortisone (ANUSOL-HC) 25 MG suppository Place 1 suppository (25 mg total) rectally 2 (two) times daily. 12 suppository 0   hydroxypropyl methylcellulose / hypromellose (ISOPTO TEARS /  GONIOVISC) 2.5 % ophthalmic solution Place 2 drops into both eyes 2 (two) times daily as needed for dry eyes.     LORazepam (ATIVAN) 0.5 MG tablet Take 1 tablet (0.5 mg total) by mouth every 6 (six) hours as needed (Nausea or vomiting). 30 tablet 0   magic mouthwash (multi-ingredient) oral suspension Take 5 mLs by mouth 4 times daily as needed for mouth and throat pain. 400 mL 1   magic mouthwash w/lidocaine SOLN Take 5 mLs by mouth 4 (four) times daily as needed for mouth pain (mouth and throat pain related to radiation). Compound 400 ml total volume with  80ml of Maalox 80ml of Nystatin 100,000 unit/ml 80ml of 2% viscous lidocaine 80ml of Diphenhydramine 12.5mg /86ml 80ml of Distilled water. 400 mL 1   metoprolol tartrate (LOPRESSOR) 25 MG tablet Take 12.5 mg by mouth 2 (two) times daily. Take with flecainide     mirtazapine (REMERON) 30 MG tablet Take 30 mg by mouth at bedtime.     Nutritional Supplements (ENSURE COMPLETE PO) Take 1 Container by mouth 2 (two) times daily.     nystatin (MYCOSTATIN) 100000 UNIT/ML suspension Take 5 mLs (500,000 Units total) by mouth 4 (four) times daily. 200 mL 2   omeprazole (PRILOSEC) 20 MG capsule Take 20 mg by mouth daily before breakfast.     pirtobrutinib (JAYPIRCA) 100 MG  tablet Take 2 tablets (200 mg total) by mouth daily. 60 tablet 1   potassium chloride SA (KLOR-CON M) 20 MEQ tablet TAKE 1 TABLET(20 MEQ) BY MOUTH TWICE DAILY 180 tablet 2   prazosin (MINIPRESS) 2 MG capsule Take 6 mg by mouth at bedtime.     sucralfate (CARAFATE) 1 GM/10ML suspension Take 10 mLs by mouth 4 times daily -  with meals and at bedtime. 420 mL 0   terbinafine (LAMISIL) 1 % cream Apply 1 application topically 2 (two) times daily as needed (athlete's foot).     torsemide (DEMADEX) 10 MG tablet Take 20 mg by mouth daily.     triamcinolone cream (KENALOG) 0.1 % Apply 1 application topically 2 (two) times daily as needed (rash).     No current facility-administered medications for  this encounter.    REVIEW OF SYSTEMS:  Notable for that above.   PHYSICAL EXAM:  height is 6\' 4"  (1.93 m) and weight is 204 lb 3.2 oz (92.6 kg). His oral temperature is 97 F (36.1 C) (abnormal). His blood pressure is 116/72 and his pulse is 72. His respiration is 18 and oxygen saturation is 99%.   General: Alert and oriented, in no acute distress   HEENT: Head normocephalic and atraumatic. Extraocular movements intact. Face symmetric. Oral cavity without thrush, tongue midline. No tumor in throat.  Soft tissue mass approximately 1 cm in dimension over right temple. NECK: Small, mobile lymph node in left supraclavicular fossa approximately 1.5 cm in size. No obvious masses in cervical region. CHEST: Clear to auscultation. CARDIOVASCULAR: Regular rhythm, no murmurs. SKIN: Swelling in left upper arm above elbow. Soft tissue mass in triceps region to elbow several centimeters in size. Soft tissue mass approximately 2 cm in size over left forearm. Soft tissue mass in medial right elbow region.  Lymph: Mobile lymph nodes in bilateral axilla. Multiple bulky lymph node masses in bilateral groin extending into inner thighs. No sign of skin ulceration.  See skin exam Ext: Pitting Lymphedema in ankles.   ECOG = 1  0 - Asymptomatic (Fully active, able to carry on all predisease activities without restriction)  1 - Symptomatic but completely ambulatory (Restricted in physically strenuous activity but ambulatory and able to carry out work of a light or sedentary nature. For example, light housework, office work)  2 - Symptomatic, <50% in bed during the day (Ambulatory and capable of all self care but unable to carry out any work activities. Up and about more than 50% of waking hours)  3 - Symptomatic, >50% in bed, but not bedbound (Capable of only limited self-care, confined to bed or chair 50% or more of waking hours)  4 - Bedbound (Completely disabled. Cannot carry on any self-care. Totally confined  to bed or chair)  5 - Death   Santiago Glad MM, Creech RH, Tormey DC, et al. [phone]). "Toxicity and response criteria of the [facility]". Am. Evlyn Clines. Oncol. 5 (6): 649-55   LABORATORY DATA:  Lab Results  Component Value Date   WBC 4.0 07/25/2023   HGB 10.3 (L) 07/25/2023   HCT 31.3 (L) 07/25/2023   MCV 85.5 07/25/2023   PLT 113 (L) 07/25/2023   CMP     Component Value Date/Time   NA 141 07/25/2023 1132   K 4.0 07/25/2023 1132   CL 111 07/25/2023 1132   CO2 26 07/25/2023 1132   GLUCOSE 87 07/25/2023 1132   BUN 35 (H) 07/25/2023 1132   CREATININE 1.27 (H) 07/25/2023  1132   CALCIUM 9.2 07/25/2023 1132   PROT 6.3 (L) 07/25/2023 1132   ALBUMIN 3.6 07/25/2023 1132   AST 29 07/25/2023 1132   ALT 11 07/25/2023 1132   ALKPHOS 61 07/25/2023 1132   BILITOT 0.4 07/25/2023 1132   GFRNONAA 58 (L) 07/25/2023 1132         RADIOGRAPHY:  as above   IMPRESSION/PLAN:  MANTLE CELL Lymphoma Multiple lymphadenopathies in the left upper arm, left forearm, bilateral axilla, and bilateral groin. The patient is currently on pirtobrutinib [facility]) and has previously received ibrutinib / Rituxan. The patient has had a significant response to previous radiation therapy for a nasopharyngeal tumor. -Schedule a radiation planning session targeting the symptomatic lymph node masses. -Med onc plan based on recommendations from Dr. Alphonsa Gin //Seegers and Dr Candise Che -Expect potential side effects including skin irritation, fatigue, temporary hair loss, nausea, and possibly dysuria. Consent signed today -may have improvement in lymphedema in the lower legs post-radiation therapy.  Nasopharyngeal tumor Significant improvement post-radiation therapy. No visible tumor in the nasopharynx on PET/ examination. -Continue monitoring for recurrence.  Lymphedema New onset lymphedema in the lower legs, likely secondary to lymph node masses in the groin. - Potential improvement post-radiation  therapy.  Follow-up Appointment scheduled with Dr. Adin Hector med onc next week for further management recommendations.    On date of service, in total, I spent 45 minutes on this encounter. Patient was seen in person.   __________________________________________   Lonie Peak, MD  This document serves as a record of services personally performed by Lonie Peak, MD. It was created on her behalf by Neena Rhymes, a trained medical scribe. The creation of this record is based on the scribe's personal observations and the provider's statements to them. This document has been checked and approved by the attending provider.

## 2023-08-09 NOTE — Addendum Note (Signed)
Encounter addended by: Lonie Peak, MD on: 08/09/2023 10:45 AM  Actions taken: Clinical Note Signed

## 2023-08-14 ENCOUNTER — Other Ambulatory Visit: Payer: Self-pay

## 2023-08-14 ENCOUNTER — Ambulatory Visit
Admission: RE | Admit: 2023-08-14 | Discharge: 2023-08-14 | Disposition: A | Discharge status: Home / Self Care | Payer: Medicare Other | Source: Ambulatory Visit | Attending: Radiation Oncology | Admitting: Radiation Oncology

## 2023-08-14 DIAGNOSIS — Z510 Encounter for antineoplastic radiation therapy: Secondary | ICD-10-CM | POA: Insufficient documentation

## 2023-08-14 DIAGNOSIS — C8318 Mantle cell lymphoma, lymph nodes of multiple sites: Secondary | ICD-10-CM

## 2023-08-15 ENCOUNTER — Other Ambulatory Visit: Payer: Self-pay

## 2023-08-17 ENCOUNTER — Telehealth: Payer: Self-pay | Admitting: Surgery

## 2023-08-17 DIAGNOSIS — Z510 Encounter for antineoplastic radiation therapy: Secondary | ICD-10-CM | POA: Diagnosis not present

## 2023-08-17 NOTE — Telephone Encounter (Signed)
Patient called Jerry Tran dry cough that started after last radiation treatment. Denies fevers or chills. Patient advised to suck on hard candy and rinse mouth with salt water as needed. Advised patient that Dr Candise Che would be notified of his concerns and that his office would reach out with any additional recommendations.

## 2023-08-18 ENCOUNTER — Other Ambulatory Visit: Payer: Self-pay

## 2023-08-20 ENCOUNTER — Other Ambulatory Visit: Payer: Self-pay

## 2023-08-20 ENCOUNTER — Ambulatory Visit: Admission: RE | Admit: 2023-08-20 | Payer: Medicare Other | Source: Ambulatory Visit | Admitting: Radiation Oncology

## 2023-08-20 ENCOUNTER — Ambulatory Visit
Admission: RE | Admit: 2023-08-20 | Discharge: 2023-08-20 | Disposition: A | Discharge status: Home / Self Care | Payer: Medicare Other | Source: Ambulatory Visit | Attending: Radiation Oncology

## 2023-08-20 DIAGNOSIS — Z510 Encounter for antineoplastic radiation therapy: Secondary | ICD-10-CM | POA: Diagnosis not present

## 2023-08-20 LAB — RAD ONC ARIA SESSION SUMMARY
Course Elapsed Days: 0
Plan Fractions Treated to Date: 1
Plan Prescribed Dose Per Fraction: 4 Gy
Plan Total Fractions Prescribed: 2
Plan Total Prescribed Dose: 8 Gy
Reference Point Dosage Given to Date: 4 Gy
Reference Point Session Dosage Given: 4 Gy
Session Number: 1

## 2023-08-21 ENCOUNTER — Ambulatory Visit
Admission: RE | Admit: 2023-08-21 | Discharge: 2023-08-21 | Disposition: A | Discharge status: Home / Self Care | Payer: Medicare Other | Source: Ambulatory Visit | Attending: Radiation Oncology | Admitting: Radiation Oncology

## 2023-08-21 ENCOUNTER — Other Ambulatory Visit: Payer: Self-pay

## 2023-08-21 ENCOUNTER — Ambulatory Visit: Payer: Medicare Other

## 2023-08-21 ENCOUNTER — Ambulatory Visit: Payer: Medicare Other | Admitting: Radiation Oncology

## 2023-08-21 DIAGNOSIS — Z510 Encounter for antineoplastic radiation therapy: Secondary | ICD-10-CM | POA: Diagnosis not present

## 2023-08-21 LAB — RAD ONC ARIA SESSION SUMMARY
Course Elapsed Days: 1
Plan Fractions Treated to Date: 1
Plan Fractions Treated to Date: 1
Plan Fractions Treated to Date: 1
Plan Fractions Treated to Date: 1
Plan Prescribed Dose Per Fraction: 4 Gy
Plan Prescribed Dose Per Fraction: 4 Gy
Plan Prescribed Dose Per Fraction: 4 Gy
Plan Prescribed Dose Per Fraction: 4 Gy
Plan Total Fractions Prescribed: 2
Plan Total Fractions Prescribed: 2
Plan Total Fractions Prescribed: 2
Plan Total Fractions Prescribed: 2
Plan Total Prescribed Dose: 8 Gy
Plan Total Prescribed Dose: 8 Gy
Plan Total Prescribed Dose: 8 Gy
Plan Total Prescribed Dose: 8 Gy
Reference Point Dosage Given to Date: 4 Gy
Reference Point Dosage Given to Date: 4 Gy
Reference Point Dosage Given to Date: 4 Gy
Reference Point Dosage Given to Date: 4 Gy
Reference Point Session Dosage Given: 4 Gy
Reference Point Session Dosage Given: 4 Gy
Reference Point Session Dosage Given: 4 Gy
Reference Point Session Dosage Given: 4 Gy
Session Number: 2

## 2023-08-22 ENCOUNTER — Other Ambulatory Visit: Payer: Self-pay

## 2023-08-22 ENCOUNTER — Ambulatory Visit: Payer: Medicare Other | Admitting: Radiation Oncology

## 2023-08-22 ENCOUNTER — Ambulatory Visit
Admission: RE | Admit: 2023-08-22 | Discharge: 2023-08-22 | Disposition: A | Discharge status: Home / Self Care | Payer: Medicare Other | Source: Ambulatory Visit | Attending: Radiation Oncology | Admitting: Radiation Oncology

## 2023-08-22 DIAGNOSIS — Z510 Encounter for antineoplastic radiation therapy: Secondary | ICD-10-CM | POA: Diagnosis not present

## 2023-08-22 LAB — RAD ONC ARIA SESSION SUMMARY
Course Elapsed Days: 2
Plan Fractions Treated to Date: 2
Plan Fractions Treated to Date: 2
Plan Fractions Treated to Date: 2
Plan Fractions Treated to Date: 2
Plan Prescribed Dose Per Fraction: 4 Gy
Plan Prescribed Dose Per Fraction: 4 Gy
Plan Prescribed Dose Per Fraction: 4 Gy
Plan Prescribed Dose Per Fraction: 4 Gy
Plan Total Fractions Prescribed: 2
Plan Total Fractions Prescribed: 2
Plan Total Fractions Prescribed: 2
Plan Total Fractions Prescribed: 2
Plan Total Prescribed Dose: 8 Gy
Plan Total Prescribed Dose: 8 Gy
Plan Total Prescribed Dose: 8 Gy
Plan Total Prescribed Dose: 8 Gy
Reference Point Dosage Given to Date: 8 Gy
Reference Point Dosage Given to Date: 8 Gy
Reference Point Dosage Given to Date: 8 Gy
Reference Point Dosage Given to Date: 8 Gy
Reference Point Session Dosage Given: 4 Gy
Reference Point Session Dosage Given: 4 Gy
Reference Point Session Dosage Given: 4 Gy
Reference Point Session Dosage Given: 4 Gy
Session Number: 3

## 2023-08-23 ENCOUNTER — Other Ambulatory Visit (HOSPITAL_COMMUNITY): Payer: Self-pay

## 2023-08-23 ENCOUNTER — Ambulatory Visit
Admission: RE | Admit: 2023-08-23 | Discharge: 2023-08-23 | Disposition: A | Discharge status: Home / Self Care | Payer: Medicare Other | Source: Ambulatory Visit | Attending: Radiation Oncology | Admitting: Radiation Oncology

## 2023-08-23 ENCOUNTER — Other Ambulatory Visit: Payer: Self-pay

## 2023-08-23 ENCOUNTER — Ambulatory Visit: Payer: Medicare Other

## 2023-08-23 DIAGNOSIS — Z510 Encounter for antineoplastic radiation therapy: Secondary | ICD-10-CM | POA: Diagnosis not present

## 2023-08-23 DIAGNOSIS — C8318 Mantle cell lymphoma, lymph nodes of multiple sites: Secondary | ICD-10-CM

## 2023-08-23 LAB — RAD ONC ARIA SESSION SUMMARY
Course Elapsed Days: 3
Plan Fractions Treated to Date: 1
Plan Fractions Treated to Date: 2
Plan Prescribed Dose Per Fraction: 4 Gy
Plan Prescribed Dose Per Fraction: 4 Gy
Plan Total Fractions Prescribed: 2
Plan Total Fractions Prescribed: 2
Plan Total Prescribed Dose: 8 Gy
Plan Total Prescribed Dose: 8 Gy
Reference Point Dosage Given to Date: 4 Gy
Reference Point Dosage Given to Date: 8 Gy
Reference Point Session Dosage Given: 4 Gy
Reference Point Session Dosage Given: 4 Gy
Session Number: 4

## 2023-08-24 ENCOUNTER — Other Ambulatory Visit: Payer: Self-pay

## 2023-08-24 ENCOUNTER — Ambulatory Visit
Admission: RE | Admit: 2023-08-24 | Discharge: 2023-08-24 | Disposition: A | Discharge status: Home / Self Care | Payer: Medicare Other | Source: Ambulatory Visit | Attending: Radiation Oncology

## 2023-08-24 ENCOUNTER — Inpatient Hospital Stay: Payer: Medicare Other | Attending: Hematology and Oncology | Admitting: Hematology

## 2023-08-24 ENCOUNTER — Inpatient Hospital Stay: Payer: Medicare Other

## 2023-08-24 ENCOUNTER — Ambulatory Visit
Admission: RE | Admit: 2023-08-24 | Discharge: 2023-08-24 | Disposition: A | Discharge status: Home / Self Care | Payer: Medicare Other | Source: Ambulatory Visit | Attending: Radiation Oncology | Admitting: Radiation Oncology

## 2023-08-24 VITALS — BP 122/80 | HR 83 | Temp 97.7°F | Resp 16

## 2023-08-24 DIAGNOSIS — Z79899 Other long term (current) drug therapy: Secondary | ICD-10-CM | POA: Diagnosis not present

## 2023-08-24 DIAGNOSIS — R911 Solitary pulmonary nodule: Secondary | ICD-10-CM | POA: Diagnosis not present

## 2023-08-24 DIAGNOSIS — D649 Anemia, unspecified: Secondary | ICD-10-CM | POA: Insufficient documentation

## 2023-08-24 DIAGNOSIS — Z87891 Personal history of nicotine dependence: Secondary | ICD-10-CM | POA: Diagnosis not present

## 2023-08-24 DIAGNOSIS — C8318 Mantle cell lymphoma, lymph nodes of multiple sites: Secondary | ICD-10-CM

## 2023-08-24 DIAGNOSIS — I480 Paroxysmal atrial fibrillation: Secondary | ICD-10-CM | POA: Insufficient documentation

## 2023-08-24 DIAGNOSIS — Z5112 Encounter for antineoplastic immunotherapy: Secondary | ICD-10-CM | POA: Insufficient documentation

## 2023-08-24 DIAGNOSIS — Z8546 Personal history of malignant neoplasm of prostate: Secondary | ICD-10-CM | POA: Diagnosis not present

## 2023-08-24 DIAGNOSIS — N1832 Chronic kidney disease, stage 3b: Secondary | ICD-10-CM | POA: Insufficient documentation

## 2023-08-24 DIAGNOSIS — D696 Thrombocytopenia, unspecified: Secondary | ICD-10-CM | POA: Insufficient documentation

## 2023-08-24 DIAGNOSIS — E43 Unspecified severe protein-calorie malnutrition: Secondary | ICD-10-CM | POA: Diagnosis not present

## 2023-08-24 DIAGNOSIS — Z923 Personal history of irradiation: Secondary | ICD-10-CM | POA: Insufficient documentation

## 2023-08-24 DIAGNOSIS — E883 Tumor lysis syndrome: Secondary | ICD-10-CM

## 2023-08-24 DIAGNOSIS — Z7189 Other specified counseling: Secondary | ICD-10-CM

## 2023-08-24 LAB — CBC WITH DIFFERENTIAL (CANCER CENTER ONLY)
Abs Immature Granulocytes: 0.08 10*3/uL — ABNORMAL HIGH (ref 0.00–0.07)
Basophils Absolute: 0 10*3/uL (ref 0.0–0.1)
Basophils Relative: 1 %
Eosinophils Absolute: 0.1 10*3/uL (ref 0.0–0.5)
Eosinophils Relative: 5 %
HCT: 26.7 % — ABNORMAL LOW (ref 39.0–52.0)
Hemoglobin: 8.5 g/dL — ABNORMAL LOW (ref 13.0–17.0)
Immature Granulocytes: 3 %
Lymphocytes Relative: 18 %
Lymphs Abs: 0.5 10*3/uL — ABNORMAL LOW (ref 0.7–4.0)
MCH: 27.9 pg (ref 26.0–34.0)
MCHC: 31.8 g/dL (ref 30.0–36.0)
MCV: 87.5 fL (ref 80.0–100.0)
Monocytes Absolute: 0.3 10*3/uL (ref 0.1–1.0)
Monocytes Relative: 9 %
Neutro Abs: 1.9 10*3/uL (ref 1.7–7.7)
Neutrophils Relative %: 64 %
Platelet Count: 118 10*3/uL — ABNORMAL LOW (ref 150–400)
RBC: 3.05 MIL/uL — ABNORMAL LOW (ref 4.22–5.81)
RDW: 15.5 % (ref 11.5–15.5)
WBC Count: 2.9 10*3/uL — ABNORMAL LOW (ref 4.0–10.5)
nRBC: 0 % (ref 0.0–0.2)

## 2023-08-24 LAB — CMP (CANCER CENTER ONLY)
ALT: 10 U/L (ref 0–44)
AST: 47 U/L — ABNORMAL HIGH (ref 15–41)
Albumin: 3.5 g/dL (ref 3.5–5.0)
Alkaline Phosphatase: 72 U/L (ref 38–126)
Anion gap: 2 — ABNORMAL LOW (ref 5–15)
BUN: 47 mg/dL — ABNORMAL HIGH (ref 8–23)
CO2: 27 mmol/L (ref 22–32)
Calcium: 9.2 mg/dL (ref 8.9–10.3)
Chloride: 109 mmol/L (ref 98–111)
Creatinine: 1.29 mg/dL — ABNORMAL HIGH (ref 0.61–1.24)
GFR, Estimated: 57 mL/min — ABNORMAL LOW (ref >60)
Glucose, Bld: 105 mg/dL — ABNORMAL HIGH (ref 70–99)
Potassium: 4.4 mmol/L (ref 3.5–5.1)
Sodium: 138 mmol/L (ref 135–145)
Total Bilirubin: 0.6 mg/dL (ref 0.3–1.2)
Total Protein: 6.4 g/dL — ABNORMAL LOW (ref 6.5–8.1)

## 2023-08-24 LAB — RAD ONC ARIA SESSION SUMMARY
Course Elapsed Days: 4
Plan Fractions Treated to Date: 2
Plan Prescribed Dose Per Fraction: 4 Gy
Plan Total Fractions Prescribed: 2
Plan Total Prescribed Dose: 8 Gy
Reference Point Dosage Given to Date: 8 Gy
Reference Point Session Dosage Given: 4 Gy
Session Number: 5

## 2023-08-24 LAB — LACTATE DEHYDROGENASE: LDH: 1404 U/L — ABNORMAL HIGH (ref 98–192)

## 2023-08-24 MED ORDER — SODIUM CHLORIDE 0.9% FLUSH
10.0000 mL | Freq: Once | INTRAVENOUS | Status: AC
  Administered 2023-08-24: 10 mL

## 2023-08-24 MED ORDER — METHYLPREDNISOLONE SODIUM SUCC 125 MG IJ SOLR
125.0000 mg | Freq: Every day | INTRAMUSCULAR | Status: DC
  Administered 2023-08-24: 125 mg via INTRAVENOUS
  Filled 2023-08-24: qty 2

## 2023-08-24 MED ORDER — HEPARIN SOD (PORK) LOCK FLUSH 100 UNIT/ML IV SOLN: 500.0000 [IU] | Freq: Once | INTRAVENOUS | Status: DC

## 2023-08-24 MED ORDER — FAMOTIDINE IN NACL 20-0.9 MG/50ML-% IV SOLN
20.0000 mg | Freq: Once | INTRAVENOUS | Status: AC
  Administered 2023-08-24: 20 mg via INTRAVENOUS
  Filled 2023-08-24: qty 50

## 2023-08-24 MED ORDER — SODIUM CHLORIDE 0.9 % IV SOLN: Freq: Once | INTRAVENOUS | Status: AC

## 2023-08-24 MED ORDER — SODIUM CHLORIDE 0.9% FLUSH
10.0000 mL | INTRAVENOUS | Status: DC | PRN
  Administered 2023-08-24: 10 mL

## 2023-08-24 MED ORDER — RITUXIMAB-ABBS CHEMO 500 MG/50ML IV SOLN
375.0000 mg/m2 | Freq: Once | INTRAVENOUS | Status: AC
  Administered 2023-08-24: 800 mg via INTRAVENOUS
  Filled 2023-08-24: qty 50

## 2023-08-24 MED ORDER — HEPARIN SOD (PORK) LOCK FLUSH 100 UNIT/ML IV SOLN
500.0000 [IU] | Freq: Once | INTRAVENOUS | Status: AC | PRN
  Administered 2023-08-24: 500 [IU]

## 2023-08-24 MED ORDER — DIPHENHYDRAMINE HCL 25 MG PO CAPS
50.0000 mg | ORAL_CAPSULE | Freq: Once | ORAL | Status: AC
  Administered 2023-08-24: 50 mg via ORAL
  Filled 2023-08-24: qty 2

## 2023-08-24 MED ORDER — ACETAMINOPHEN 325 MG PO TABS
650.0000 mg | ORAL_TABLET | Freq: Once | ORAL | Status: AC
  Administered 2023-08-24: 650 mg via ORAL
  Filled 2023-08-24: qty 2

## 2023-08-24 NOTE — Progress Notes (Signed)
HEMATOLOGY/ONCOLOGY CLINIC VISIT NOTE  Date of Service: 08/24/23  Patient Care Team: [facility], [facility] as PCP - General (General Practice) [name], MD as Consulting Physician (Hematology)  CHIEF COMPLAINTS/PURPOSE OF CONSULTATION:  Follow-up for continued evaluation and management of relapsed mantle cell lymphoma  HISTORY OF PRESENTING ILLNESS:  Please see previous note for details of initial presentation  INTERVAL HISTORY  Jerry Tran is here for continued evaluation and management of his relapsed mantle cell lymphoma.   Patient was last seen by me on 07/25/2023 and complained of plenty of yellow nasal secretions, new mass in his left forearm and nodule near his left elbow with persistent swelling and intermittent pain, increased size of nodule in groin, lymph node in left axillary, painful burning sensation in his right forearm, mild leg swelling.  He was seen by Dr. Colen Darling on 08/22/2023 and discussed details of CAR T process.   Today, he is accompanied by his wife. Patient reports that he has been feeling well overall since his last clinical visit.     He has been tolerating Pirtobrutinib well with no major toxicity issues.     [facility] 23 pathologist sugg aggressive version of mantle cell lymphoma. She agress plan treatment so far. Suggested rather aggressive, not sure how long oral med will ocntrol this. Discusedd car t cell therapy and trials with bispecific antibodies.    Concerned that progresses b4 plan  Pt take time to pray about his condition  He has no other new symptom. Patient denies any fever, chills, night sweats, voice issues, breathing issues, swallowing issues, infection issues, back pain, abdominal pain, change in bowel habits, black stools, blood in stools  His mild leg swelling has been stable and he denies any new or different leg swelling.   In regards to the mass in his groin, he reports some excess foreskin and swelling in  his penis. He reports that the lower lymph node had resolved and the high lymph node fluctuates.  He reports that the mass in his left arm has improved and he denies any other new lumps.   I did not feel any major lymph nodes in his neck during physical examination.     Patient has been eating well  no issues tolerating new medicine   Since as soon as CT scan 2 weeks ago. Slowinly improving     -Discussed lab results on 08/24/23 in detail with patient. CBC showed WBC of 2.9K, hemoglobin of 8.5, and platelets mildly low at 118K. -some anemia related to treatment and radiation therapy -Low platelets are stable -WBC are low, though this is not immediately concerning at this time. Will continue to monitor with Pirtobrutinib and radiation therapy. -CMP pending at time of visit   -he is scheduled for cycle 3, day 1 of Rituxan today  -dr Basilio Cairo rec next step of treatment at [facility]. She did provide contact info one he has made a decision  -will continue to monitor with labs in 2-3 weeks   -advised patient to call us if he has any questions  -next week or two after praying. Contact Dr. Seth Bake if comes to a conclusion before that.   -recommend if is considering car t cell, to time to plan treamtnet. If actively bothersome, ay not able to offer treatent.   -do not feel that chemotherapy would be as effective and may be difficult to tolerate  Patient reports that he would like to take some time to Pray this weekend  with family.   -generally repeat scans every 2-3 months unless any new symptoms.    He reports that the spot in his forehead previously grew in size and has been treated with radiation therapy as well.   -answered all of patient's and his wife's questions regarding treatment in detail  -next best option CAR T cell therapies. Discussed that the response rate may be as high as 80-90%.   -discussed options for patient to either be kept comfortable or actively receive  treatment        -Patient has started radiation and will receive his final treatment today  -patient was recently switched from Ibrutinib to a newer generation of BTK inhibitor, Pirtobrutinib -He has been tolerating Pirtobrutinib well with no major toxicity issues  -continue Pirtobrutinib      MEDICAL HISTORY:  Past Medical History:  Diagnosis Date   Arthritis    Chronic kidney disease, stage 3b (HCC) 12/09/2021   Dyslipidemia    GERD (gastroesophageal reflux disease)    Hypertension    PAF (paroxysmal atrial fibrillation) (HCC)    Prostate cancer (HCC) 03/2021   s/p radiation therapy   PTSD (post-traumatic stress disorder)     SURGICAL HISTORY: Past Surgical History:  Procedure Laterality Date   IR IMAGING GUIDED PORT INSERTION  11/06/2022   LIPOMA EXCISION Right 12/09/2021   Procedure: RIGHT SUPERCLAVICULAR LYMPH NODE EXCISION;  Surgeon: Andria Meuse, MD;  Location: [facility] OR;  Service: General;  Laterality: Right;    SOCIAL HISTORY: Social History   Socioeconomic History   Marital status: Married    Spouse name: Not on file   Number of children: Not on file   Years of education: Not on file   Highest education level: Not on file  Occupational History   Occupation: retired  Tobacco Use   Smoking status: Former    Current packs/day: 1.00    Average packs/day: 1 pack/day for 15.0 years (15.0 ttl pk-yrs)    Types: Cigarettes   Smokeless tobacco: Never  Substance and Sexual Activity   Alcohol use: Not Currently    Comment: h/o heavy use   Drug use: Not Currently    Types: Marijuana, Cocaine    Comment: remote   Sexual activity: Not on file  Other Topics Concern   Not on file  Social History Narrative   Not on file   Social Determinants of Health   Financial Resource Strain: Not on file  Food Insecurity: No Food Insecurity (04/03/2023)   Hunger Vital Sign    Worried About Running Out of Food in the Last Year: Never true    Ran Out of Food in the  Last Year: Never true  Transportation Needs: No Transportation Needs (04/03/2023)   PRAPARE - Administrator, Civil Service (Medical): No    Lack of Transportation (Non-Medical): No  Physical Activity: Not on file  Stress: Not on file  Social Connections: Not on file  Intimate Partner Violence: Not At Risk (04/03/2023)   Humiliation, Afraid, Rape, and Kick questionnaire    Fear of Current or Ex-Partner: No    Emotionally Abused: No    Physically Abused: No    Sexually Abused: No    FAMILY HISTORY: Family History  Problem Relation Age of Onset   Cancer Neg Hx     ALLERGIES:  is allergic to sildenafil.  MEDICATIONS:  Current Outpatient Medications  Medication Sig Dispense Refill   ammonium lactate (LAC-HYDRIN) 12 % lotion Apply 1 application topically 2 (  two) times daily as needed for dry skin.     atorvastatin (LIPITOR) 20 MG tablet Take 10 mg by mouth at bedtime.     Cholecalciferol (VITAMIN D3 PO) Take 1 tablet by mouth every morning.     diclofenac Sodium (VOLTAREN) 1 % GEL Apply 2 g topically 4 (four) times daily as needed (pain).     feeding supplement (ENSURE ENLIVE / ENSURE PLUS) LIQD Take 237 mLs by mouth 2 (two) times daily between meals. 237 mL 12   flecainide (TAMBOCOR) 100 MG tablet Take 100 mg by mouth every 12 (twelve) hours.     fluticasone (FLONASE) 50 MCG/ACT nasal spray Place 1 spray into both nostrils daily as needed for allergies or rhinitis.     furosemide (LASIX) 20 MG tablet Take 20 mg by mouth.     hydrocortisone (ANUSOL-HC) 25 MG suppository Place 1 suppository (25 mg total) rectally 2 (two) times daily. 12 suppository 0   hydroxypropyl methylcellulose / hypromellose (ISOPTO TEARS / GONIOVISC) 2.5 % ophthalmic solution Place 2 drops into both eyes 2 (two) times daily as needed for dry eyes.     LORazepam (ATIVAN) 0.5 MG tablet Take 1 tablet (0.5 mg total) by mouth every 6 (six) hours as needed (Nausea or vomiting). 30 tablet 0   magic mouthwash  (multi-ingredient) oral suspension Take 5 mLs by mouth 4 times daily as needed for mouth and throat pain. 400 mL 1   magic mouthwash w/lidocaine SOLN Take 5 mLs by mouth 4 (four) times daily as needed for mouth pain (mouth and throat pain related to radiation). Compound 400 ml total volume with  80ml of Maalox 80ml of Nystatin 100,000 unit/ml 80ml of 2% viscous lidocaine 80ml of Diphenhydramine 12.5mg /33ml 80ml of Distilled water. 400 mL 1   metoprolol tartrate (LOPRESSOR) 25 MG tablet Take 12.5 mg by mouth 2 (two) times daily. Take with flecainide     mirtazapine (REMERON) 30 MG tablet Take 30 mg by mouth at bedtime.     Nutritional Supplements (ENSURE COMPLETE PO) Take 1 Container by mouth 2 (two) times daily.     nystatin (MYCOSTATIN) 100000 UNIT/ML suspension Take 5 mLs (500,000 Units total) by mouth 4 (four) times daily. 200 mL 2   omeprazole (PRILOSEC) 20 MG capsule Take 20 mg by mouth daily before breakfast.     pirtobrutinib (JAYPIRCA) 100 MG tablet Take 2 tablets (200 mg total) by mouth daily. 60 tablet 1   potassium chloride SA (KLOR-CON M) 20 MEQ tablet TAKE 1 TABLET(20 MEQ) BY MOUTH TWICE DAILY 180 tablet 2   prazosin (MINIPRESS) 2 MG capsule Take 6 mg by mouth at bedtime.     sucralfate (CARAFATE) 1 GM/10ML suspension Take 10 mLs by mouth 4 times daily -  with meals and at bedtime. 420 mL 0   terbinafine (LAMISIL) 1 % cream Apply 1 application topically 2 (two) times daily as needed (athlete's foot).     torsemide (DEMADEX) 10 MG tablet Take 20 mg by mouth daily.     triamcinolone cream (KENALOG) 0.1 % Apply 1 application topically 2 (two) times daily as needed (rash).     No current facility-administered medications for this visit.    REVIEW OF SYSTEMS:    10 Point review of Systems was done is negative except as noted above.   PHYSICAL EXAMINATION: .BP (!) 112/58   Pulse 76   Temp (!) 97 F (36.1 C)   Resp 20   Wt 218 lb 9.6 oz (99.2 kg)  SpO2 100%   BMI 26.61 kg/m    GENERAL:alert, in no acute distress and comfortable SKIN: no acute rashes, no significant lesions EYES: conjunctiva are pink and non-injected, sclera anicteric OROPHARYNX: MMM, no exudates, no oropharyngeal erythema or ulceration NECK: supple, no JVD LYMPH:  no palpable lymphadenopathy in the cervical, axillary or inguinal regions LUNGS: clear to auscultation b/l with normal respiratory effort HEART: regular rate & rhythm ABDOMEN:  normoactive bowel sounds , non tender, not distended. Extremity: no pedal edema PSYCH: alert & oriented x 3 with fluent speech NEURO: no focal motor/sensory deficits   LABORATORY DATA:  I have reviewed the data as listed     Latest Ref Rng & Units 08/24/2023   10:20 AM 07/25/2023   11:32 AM 06/29/2023    9:37 AM  CBC  WBC 4.0 - 10.5 K/uL 2.9  4.0  3.6   Hemoglobin 13.0 - 17.0 g/dL 8.5  96.0  45.4   Hematocrit 39.0 - 52.0 % 26.7  31.3  32.5   Platelets 150 - 400 K/uL 118  113  120       Latest Ref Rng & Units 07/25/2023   11:32 AM 06/29/2023    9:37 AM 05/30/2023    1:06 PM  CMP  Glucose 70 - 99 mg/dL 87  84  76   BUN 8 - 23 mg/dL 35  32  32   Creatinine 0.61 - 1.24 mg/dL 0.98  1.19  1.47   Sodium 135 - 145 mmol/L 141  139  139   Potassium 3.5 - 5.1 mmol/L 4.0  3.8  4.0   Chloride 98 - 111 mmol/L 111  108  108   CO2 22 - 32 mmol/L 26  26  27    Calcium 8.9 - 10.3 mg/dL 9.2  9.2  9.3   Total Protein 6.5 - 8.1 g/dL 6.3  6.3  6.1   Total Bilirubin 0.3 - 1.2 mg/dL 0.4  0.4  0.3   Alkaline Phos 38 - 126 U/L 61  52  50   AST 15 - 41 U/L 29  20  19    ALT 0 - 44 U/L 11  10  10     Lab Results  Component Value Date   LDH 545 (H) 07/25/2023    Surgical Pathology 10/16/2022: A. 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    RADIOGRAPHIC STUDIES: I have personally reviewed the radiological images as listed and agreed with the findings in the report.  CT Chest/abd/pelvis 7/30:  IMPRESSION: 1. Interval enlargement of previously demonstrated bilateral inguinal adenopathy consistent with progressive lymphoma. 2. New mildly enlarged right axillary lymph node and right external iliac lymph node, also suspicious for lymphoma. 3. New small right lower lobe pulmonary nodule, possibly a lymph node. Recommend attention on follow-up. 4. No other evidence of lymphoma in the chest, abdomen or pelvis. 5. Stable incidental findings including small pericardial effusion, hepatic and renal cysts and mild lumbar spondylosis. Mildly prominent stool throughout the colon suggesting constipation. 6.  Aortic Atherosclerosis (ICD10-I70.0).     Electronically Signed   By: Carey Bullocks M.D.   On: 05/29/2023 12:55   ASSESSMENT & PLAN:  77 y.o.  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  3) moderate to severe protein calorie malnutrition-He has lost 8lbs but is drinking x2-3 Ensure drinks per week.  Wt Readings from Last 3 Encounters:  08/24/23 218 lb 9.6 oz (99.2 kg)  08/08/23 204 lb 3.2 oz (92.6 kg)  07/25/23 199 lb 8 oz (90.5 kg)   4) Loose stools- He is having a few small, loose stools per  day.  5) BLE swelling- resolved with compression socks.  6) Hemorrhoids- He reports pain with his hemorrhoid flare but denies any rectal bleeding. He has not seen GI or surgeon for this problem, but [facility]   7) Hoarse voice- He reports more difficulty projecting his voice. Denies any noticeable reflux but he does take acid suppression medication. He denies any significant seasonal allergies.  PLAN:  -Discussed lab results on 07/25/23  in detail with patient. CBC showed WBC of 4.0K, hemoglobin of 10.3, and platelets slightly low at 113K. -mild anemia -CMP fairly stable. Findings suggest mild dehydration -continue to stay well hydrated -there is concern for mantle cell lymphoma progression due to factors including mass and nodule in left arm, as well as axillary lymph nodes -patient has not been taking Ibrutinib as prescribed due to misreading instructions -Continue Ibrutinib at full dose of 560 MG at this time -will switch to pirtobrutinib and also refer him for consideration of palliative radiation therapy for symptomatic areas of involvement. -Given aggressive behavior patients MCL will refer patient to lymphoma specialist  (Dr Alphonsa Gin or Truddie Crumble) at [facility] to receive an additional opinion regarding proceeding options such as CAR T-cell therapies or other clinical trials.  -Will connect with Dr. Basilio Cairo to consider radiation therapy to his b/l inguinal /iliac LN and symptomatic disease in arm and axillae and based on PET/40CT -advised patient to let us know if he does not hear from radiation oncology or [facility] in the next week  -advised patient to inform us if he develops a fever, sinus infection or sore throat, which may require antibiotics -recommend sterile saline nasal spray and steam inhalation to improve nasal secretions. Claritin use would be okay. -do not believe nasal secretions are related to medication.  -will plan to repeat PET  scan  FOLLOW-UP: ***  The total time spent in the appointment was *** minutes* .  All of the patient's questions were answered with apparent satisfaction. The patient knows to call the clinic with any problems, questions or concerns.   Wyvonnia Lora MD MS AAHIVMS [facility] Hematology/Oncology Physician [facility]  .*Total Encounter Time as defined by the Centers for Medicare and Medicaid Services includes, in addition to the face-to-face time of a patient visit (documented in the note above) non-face-to-face time: obtaining and reviewing outside history, ordering and reviewing medications, tests or procedures, care coordination (communications with other health care professionals or caregivers) and documentation in the medical record.    I,Jerry Tran,acting as a Neurosurgeon for Wyvonnia Lora, MD.,have documented all relevant documentation on the  behalf of Wyvonnia Lora, MD,as directed by  Wyvonnia Lora, MD while in the presence of Wyvonnia Lora, MD.   ***

## 2023-08-27 ENCOUNTER — Other Ambulatory Visit: Payer: Self-pay

## 2023-08-27 NOTE — Radiation Completion Notes (Signed)
Patient Name: Jerry Tran, Jerry Tran MRN: [id number] Date of Birth: 04-11-1946 Referring Physician: Wyvonnia Lora, M.D. Date of Service: 2023-08-27 Radiation Oncologist: Lonie Peak, M.D. [facility]                             RADIATION ONCOLOGY END OF TREATMENT NOTE     Diagnosis: C83.18 Mantle cell lymphoma, lymph nodes of multiple sites Intent: Curative     ==========DELIVERED PLANS==========  First Treatment Date: 2023-08-20 - Last Treatment Date: 2023-08-24   Plan Name: Chest_L_Axill Site: Chest Technique: 3D Mode: Photon Dose Per Fraction: 4 Gy Prescribed Dose (Delivered / Prescribed): 8 Gy / 8 Gy Prescribed Fxs (Delivered / Prescribed): 2 / 2   Plan Name: Ext_L_Arm Site: Arm, Left Technique: 3D Mode: Photon Dose Per Fraction: 4 Gy Prescribed Dose (Delivered / Prescribed): 8 Gy / 8 Gy Prescribed Fxs (Delivered / Prescribed): 2 / 2   Plan Name: Ext_R_Arm Site: Arm, Right Technique: 3D Mode: Photon Dose Per Fraction: 4 Gy Prescribed Dose (Delivered / Prescribed): 8 Gy / 8 Gy Prescribed Fxs (Delivered / Prescribed): 2 / 2   Plan Name: Pelvis_Groins Site: Deep inguinal Nodes Technique: 3D Mode: Photon Dose Per Fraction: 4 Gy Prescribed Dose (Delivered / Prescribed): 8 Gy / 8 Gy Prescribed Fxs (Delivered / Prescribed): 2 / 2   Plan Name: HN_R_Temple Site: Face Technique: Electron Mode: Electron Dose Per Fraction: 4 Gy Prescribed Dose (Delivered / Prescribed): 8 Gy / 8 Gy Prescribed Fxs (Delivered / Prescribed): 2 / 2   Plan Name: Chest_R_Axill Site: Chest Technique: 3D Mode: Photon Dose Per Fraction: 4 Gy Prescribed Dose (Delivered / Prescribed): 8 Gy / 8 Gy Prescribed Fxs (Delivered / Prescribed): 2 / 2     ==========ON TREATMENT VISIT DATES========== 2023-08-20, 2023-08-24     ==========UPCOMING VISITS==========       ==========APPENDIX - ON TREATMENT VISIT NOTES==========   See weekly On Treatment Notes in Epic for  details.

## 2023-08-28 ENCOUNTER — Other Ambulatory Visit: Payer: Self-pay

## 2023-08-28 NOTE — Progress Notes (Signed)
Specialty Pharmacy Refill Coordination Note  Jerry Tran is a 77 y.o. male who's wife, [name] was contacted today regarding refills of specialty medication(s) Pirtobrutinib   Patient requested Delivery   Delivery date: 08/30/23   Verified address: [address]   Medication will be filled on 08/29/23.

## 2023-08-28 NOTE — Progress Notes (Signed)
Specialty Pharmacy Ongoing Clinical Assessment Note  Jerry Tran is a 77 y.o. male who is being followed by the specialty pharmacy service for RxSp Oncology   Patient's specialty medication(s) reviewed today: Pirtobrutinib   Missed doses in the last 4 weeks: 0   Patient/Caregiver did not have any additional questions or concerns.   Therapeutic benefit summary: Unable to assess   Adverse events/side effects summary: No adverse events/side effects   Patient's therapy is appropriate to: Continue    Goals Addressed             This Visit's Progress    Stabilization of disease       Patient is on track. Patient will maintain adherence         Follow up:  3 months  Bobette Mo Specialty Pharmacist

## 2023-08-29 ENCOUNTER — Other Ambulatory Visit (HOSPITAL_COMMUNITY): Payer: Self-pay

## 2023-08-29 ENCOUNTER — Other Ambulatory Visit: Payer: Self-pay

## 2023-08-30 ENCOUNTER — Encounter: Payer: Self-pay | Admitting: Hematology

## 2023-09-04 ENCOUNTER — Other Ambulatory Visit: Payer: Self-pay | Admitting: Hematology

## 2023-09-04 DIAGNOSIS — Z7189 Other specified counseling: Secondary | ICD-10-CM

## 2023-09-04 DIAGNOSIS — C8318 Mantle cell lymphoma, lymph nodes of multiple sites: Secondary | ICD-10-CM

## 2023-09-07 ENCOUNTER — Other Ambulatory Visit: Payer: Self-pay

## 2023-09-07 ENCOUNTER — Emergency Department (HOSPITAL_COMMUNITY)
Admission: EM | Admit: 2023-09-07 | Discharge: 2023-09-07 | Disposition: A | Discharge status: Home / Self Care | Payer: No Typology Code available for payment source | Attending: Emergency Medicine | Admitting: Emergency Medicine

## 2023-09-07 DIAGNOSIS — I129 Hypertensive chronic kidney disease with stage 1 through stage 4 chronic kidney disease, or unspecified chronic kidney disease: Secondary | ICD-10-CM | POA: Insufficient documentation

## 2023-09-07 DIAGNOSIS — N4889 Other specified disorders of penis: Secondary | ICD-10-CM

## 2023-09-07 DIAGNOSIS — Z79899 Other long term (current) drug therapy: Secondary | ICD-10-CM | POA: Diagnosis not present

## 2023-09-07 DIAGNOSIS — N1832 Chronic kidney disease, stage 3b: Secondary | ICD-10-CM | POA: Diagnosis not present

## 2023-09-07 DIAGNOSIS — Z8546 Personal history of malignant neoplasm of prostate: Secondary | ICD-10-CM | POA: Insufficient documentation

## 2023-09-07 DIAGNOSIS — N4829 Other inflammatory disorders of penis: Secondary | ICD-10-CM | POA: Insufficient documentation

## 2023-09-07 DIAGNOSIS — R1909 Other intra-abdominal and pelvic swelling, mass and lump: Secondary | ICD-10-CM

## 2023-09-07 LAB — CBC
HCT: 29.4 % — ABNORMAL LOW (ref 39.0–52.0)
Hemoglobin: 9.1 g/dL — ABNORMAL LOW (ref 13.0–17.0)
MCH: 27.9 pg (ref 26.0–34.0)
MCHC: 31 g/dL (ref 30.0–36.0)
MCV: 90.2 fL (ref 80.0–100.0)
Platelets: 110 10*3/uL — ABNORMAL LOW (ref 150–400)
RBC: 3.26 MIL/uL — ABNORMAL LOW (ref 4.22–5.81)
RDW: 16 % — ABNORMAL HIGH (ref 11.5–15.5)
WBC: 2.3 10*3/uL — ABNORMAL LOW (ref 4.0–10.5)
nRBC: 0 % (ref 0.0–0.2)

## 2023-09-07 LAB — DIFFERENTIAL
Abs Immature Granulocytes: 0.02 10*3/uL (ref 0.00–0.07)
Basophils Absolute: 0 10*3/uL (ref 0.0–0.1)
Basophils Relative: 1 %
Eosinophils Absolute: 0 10*3/uL (ref 0.0–0.5)
Eosinophils Relative: 1 %
Immature Granulocytes: 1 %
Lymphocytes Relative: 18 %
Lymphs Abs: 0.4 10*3/uL — ABNORMAL LOW (ref 0.7–4.0)
Monocytes Absolute: 0.3 10*3/uL (ref 0.1–1.0)
Monocytes Relative: 13 %
Neutro Abs: 1.4 10*3/uL — ABNORMAL LOW (ref 1.7–7.7)
Neutrophils Relative %: 66 %

## 2023-09-07 LAB — COMPREHENSIVE METABOLIC PANEL
ALT: 13 U/L (ref 0–44)
AST: 26 U/L (ref 15–41)
Albumin: 3.6 g/dL (ref 3.5–5.0)
Alkaline Phosphatase: 64 U/L (ref 38–126)
Anion gap: 7 (ref 5–15)
BUN: 36 mg/dL — ABNORMAL HIGH (ref 8–23)
CO2: 22 mmol/L (ref 22–32)
Calcium: 9.3 mg/dL (ref 8.9–10.3)
Chloride: 110 mmol/L (ref 98–111)
Creatinine, Ser: 1.36 mg/dL — ABNORMAL HIGH (ref 0.61–1.24)
GFR, Estimated: 54 mL/min — ABNORMAL LOW (ref >60)
Glucose, Bld: 89 mg/dL (ref 70–99)
Potassium: 4 mmol/L (ref 3.5–5.1)
Sodium: 139 mmol/L (ref 135–145)
Total Bilirubin: 0.6 mg/dL (ref <1.2)
Total Protein: 6.9 g/dL (ref 6.5–8.1)

## 2023-09-07 MED ORDER — PREDNISONE 20 MG PO TABS
60.0000 mg | ORAL_TABLET | Freq: Once | ORAL | Status: AC
  Administered 2023-09-07: 60 mg via ORAL
  Filled 2023-09-07: qty 3

## 2023-09-07 MED ORDER — PREDNISONE 10 MG PO TABS: 60.0000 mg | ORAL_TABLET | Freq: Every day | ORAL | 0 refills | Status: AC

## 2023-09-07 NOTE — ED Provider Notes (Signed)
[place] EMERGENCY DEPARTMENT AT [facility] Provider Note   CSN: [id number] Arrival date & time: 09/07/23  1318     History  Chief Complaint  Patient presents with   Groin Swelling    Jerry Tran is a 77 y.o. male.  Patient undergoing treatment for lymphoma.  Followed by Dr. Candise Che and followed by radiation oncology here at the [facility].  Patient week ago finished some radiation treatment to the groin area for groin adenopathy and lymphedema.  Patient's had longstanding swelling to both legs for a period of time and is on Lasix.  Patient starting 2 days after completion of radiation treatment got swelling to the penis.  And is having difficulty pulling his foreskin back.  But it is not entrapped back.  No scrotal swelling.  But marked swelling to the thighs and lower extremities.  Patient states that that is been longstanding.  And that is not new.  Patient concerned about the soft tissue swelling of the scrotum.  And the difficulties with the foreskin.  Past medical history otherwise significant for hypertension dyslipidemia and atrial fibrillation posttraumatic stress disorder.  Chronic kidney disease stage IIIb.  History of heavy use of alcohol but none recently.  Patient is able to void.  But he says not being able to pull the foreskin back makes as a little bit more difficult.  Patient seems to have a remote history of prostate CA as well.       Home Medications Prior to Admission medications   Medication Sig Start Date End Date Taking? Authorizing Provider  predniSONE (DELTASONE) 10 MG tablet Take 6 tablets (60 mg total) by mouth daily for 7 days. 09/07/23 09/14/23 Yes Vanetta Mulders, MD  acyclovir (ZOVIRAX) 400 MG tablet TAKE 1 TABLET(400 MG) BY MOUTH DAILY 09/04/23   [name], MD  ammonium lactate (LAC-HYDRIN) 12 % lotion Apply 1 application topically 2 (two) times daily as needed for dry skin.    [provider]  atorvastatin (LIPITOR)  20 MG tablet Take 10 mg by mouth at bedtime.    [provider]  Cholecalciferol (VITAMIN D3 PO) Take 1 tablet by mouth every morning.    [provider]  diclofenac Sodium (VOLTAREN) 1 % GEL Apply 2 g topically 4 (four) times daily as needed (pain).    [provider]  feeding supplement (ENSURE ENLIVE / ENSURE PLUS) LIQD Take 237 mLs by mouth 2 (two) times daily between meals. 12/11/21   Regalado, Belkys A, MD  flecainide (TAMBOCOR) 100 MG tablet Take 100 mg by mouth every 12 (twelve) hours.    [provider]  fluticasone (FLONASE) 50 MCG/ACT nasal spray Place 1 spray into both nostrils daily as needed for allergies or rhinitis.    [provider]  furosemide (LASIX) 20 MG tablet Take 20 mg by mouth.    [provider]  hydrocortisone (ANUSOL-HC) 25 MG suppository Place 1 suppository (25 mg total) rectally 2 (two) times daily. 01/30/23   Briant Cedar, PA-C  hydroxypropyl methylcellulose / hypromellose (ISOPTO TEARS / GONIOVISC) 2.5 % ophthalmic solution Place 2 drops into both eyes 2 (two) times daily as needed for dry eyes.    [provider]  LORazepam (ATIVAN) 0.5 MG tablet Take 1 tablet (0.5 mg total) by mouth every 6 (six) hours as needed (Nausea or vomiting). 12/16/21   [name], MD  magic mouthwash (multi-ingredient) oral suspension Take 5 mLs by mouth 4 times daily as needed for  mouth and throat pain. 04/26/23   [name], MD  magic mouthwash w/lidocaine SOLN Take 5 mLs by mouth 4 (four) times daily as needed for mouth pain (mouth and throat pain related to radiation). Compound 400 ml total volume with  80ml of Maalox 80ml of Nystatin 100,000 unit/ml 80ml of 2% viscous lidocaine 80ml of Diphenhydramine 12.5mg /66ml 80ml of Distilled water. 04/26/23   [name], MD  metoprolol tartrate (LOPRESSOR) 25 MG tablet Take 12.5 mg by mouth 2 (two) times daily. Take with flecainide    [provider]  mirtazapine (REMERON) 30 MG tablet Take 30 mg by mouth at bedtime. 01/31/22   [provider]  Nutritional Supplements (ENSURE COMPLETE PO) Take 1 Container by mouth 2 (two) times daily. 07/26/23   [provider]  nystatin (MYCOSTATIN) 100000 UNIT/ML suspension Take 5 mLs (500,000 Units total) by mouth 4 (four) times daily. 03/23/23   [name], MD  omeprazole (PRILOSEC) 20 MG capsule Take 20 mg by mouth daily before breakfast.    [provider]  pirtobrutinib (JAYPIRCA) 100 MG tablet Take 2 tablets (200 mg total) by mouth daily. 07/30/23   [name], MD  potassium chloride SA (KLOR-CON M) 20 MEQ tablet TAKE 1 TABLET(20 MEQ) BY MOUTH TWICE DAILY 03/01/23   [name], MD  prazosin (MINIPRESS) 2 MG capsule Take 6 mg by mouth at bedtime.    [provider]  sucralfate (CARAFATE) 1 GM/10ML suspension Take 10 mLs by mouth 4 times daily -  with meals and at bedtime. 05/14/23   Walisiewicz, Yvonna Alanis E, PA-C  terbinafine (LAMISIL) 1 % cream Apply 1 application topically 2 (two) times daily as needed (athlete's foot).    [provider]  torsemide (DEMADEX) 10 MG tablet Take 20 mg by mouth daily.    [provider]  triamcinolone cream (KENALOG) 0.1 % Apply 1 application topically 2 (two) times daily as needed (rash).    [provider]      Allergies    Sildenafil    Review of Systems   Review of Systems  Constitutional:  Negative for chills and fever.  HENT:  Negative for ear pain and sore throat.   Eyes:  Negative for pain and visual disturbance.  Respiratory:  Negative for cough and shortness of breath.   Cardiovascular:  Positive for leg swelling. Negative for chest pain and palpitations.  Gastrointestinal:  Negative for abdominal pain and vomiting.  Genitourinary:  Positive for difficulty urinating and penile swelling. Negative for dysuria, hematuria, scrotal swelling and testicular pain.   Musculoskeletal:  Negative for arthralgias and back pain.  Skin:  Negative for color change and rash.  Neurological:  Negative for seizures and syncope.  All other systems reviewed and are negative.   Physical Exam Updated Vital Signs BP 122/76 (BP Location: Left Arm)   Pulse (!) 57   Temp 97.9 F (36.6 C)   Resp 16   Ht 1.93 m (6\' 4" )   Wt 97.1 kg   SpO2 100%   BMI 26.05 kg/m  Physical Exam Vitals and nursing note reviewed.  Constitutional:      General: He is not in acute distress.    Appearance: He is well-developed.  HENT:     Head: Normocephalic and atraumatic.  Eyes:     Conjunctiva/sclera: Conjunctivae normal.  Cardiovascular:     Rate and Rhythm: Normal rate and regular rhythm.     Heart sounds: No murmur heard. Pulmonary:  Effort: Pulmonary effort is normal. No respiratory distress.     Breath sounds: Normal breath sounds.  Abdominal:     Palpations: Abdomen is soft.     Tenderness: There is no abdominal tenderness.  Genitourinary:    Comments: Soft tissue swelling to the penis not consistent with erection.  Glans is normal.  Foreskin can be pulled back does not get entrapped.  No scrotal swelling.  Testicles bilaterally nontender.  Marked swelling to the groin with obvious groin adenopathy bilaterally that was undergoing radiation treatment also the penile skin has no blisters no peeling.  No signs of any skin irritation or infection.  And there is also swelling of the lower extremities all the way down to the feet bilateral significant edema. Musculoskeletal:        General: No swelling.     Cervical back: Neck supple.     Right lower leg: Edema present.     Left lower leg: Edema present.  Skin:    General: Skin is warm and dry.     Capillary Refill: Capillary refill takes less than 2 seconds.  Neurological:     General: No focal deficit present.     Mental Status: He is alert and oriented to person, place, and time.  Psychiatric:        Mood and  Affect: Mood normal.     ED Results / Procedures / Treatments   Labs (all labs ordered are listed, but only abnormal results are displayed) Labs Reviewed  CBC - Abnormal; Notable for the following components:      Result Value   WBC 2.3 (*)    RBC 3.26 (*)    Hemoglobin 9.1 (*)    HCT 29.4 (*)    RDW 16.0 (*)    Platelets 110 (*)    All other components within normal limits  COMPREHENSIVE METABOLIC PANEL - Abnormal; Notable for the following components:   BUN 36 (*)    Creatinine, Ser 1.36 (*)    GFR, Estimated 54 (*)    All other components within normal limits  DIFFERENTIAL - Abnormal; Notable for the following components:   Neutro Abs 1.4 (*)    Lymphs Abs 0.4 (*)    All other components within normal limits    EKG None  Radiology No results found.  Procedures Procedures    Medications Ordered in ED Medications  predniSONE (DELTASONE) tablet 60 mg (has no administration in time range)    ED Course/ Medical Decision Making/ A&P                                 Medical Decision Making Amount and/or Complexity of Data Reviewed Labs: ordered.  Risk Prescription drug management.  Discussed with on-call radiation oncology Dr. Basilio Cairo.  Felt that he discussed low-dose radiation to that area as a palliative practice.  Also discussed with Dr. Bertis Ruddy who is recommending that we start him on 60 mg of prednisone and have him follow-up with urology and then follow-up with his oncology doctor next week.  Will send messages to both of them.  Patient able to void.  Foreskin is not entrapped.  Will give first dose of prednisone here today. Final Clinical Impression(s) / ED Diagnoses Final diagnoses:  Groin swelling  Penile swelling    Rx / DC Orders ED Discharge Orders          Ordered    predniSONE (DELTASONE) 10  MG tablet  Daily        09/07/23 1724              Vanetta Mulders, MD 09/07/23 1730

## 2023-09-07 NOTE — ED Triage Notes (Signed)
Pt arrived via POV. C/o swelling in scrotum and foreskin for a week after having lymph nodes removed out of groin and having radiation treatment for his lymphoma.  AOx4

## 2023-09-07 NOTE — Discharge Instructions (Signed)
Call on Monday to set up an appointment with urology.  Information provided above.  Also contact your oncologist on Monday for follow-up.  Take the prednisone as directed.  Return if your foreskin gets stuck back.  Because this can cause strangulation.

## 2023-09-12 ENCOUNTER — Telehealth: Payer: Self-pay | Admitting: Hematology

## 2023-09-12 NOTE — Telephone Encounter (Signed)
Spoke with patient confirming upcoming appointment  

## 2023-09-13 ENCOUNTER — Other Ambulatory Visit: Payer: Self-pay

## 2023-09-20 ENCOUNTER — Other Ambulatory Visit: Payer: Self-pay

## 2023-09-24 ENCOUNTER — Encounter: Payer: Self-pay | Admitting: Radiation Oncology

## 2023-09-24 NOTE — Progress Notes (Signed)
Patient identity verified x2.  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   Location-  Lymph nodes of multiple sites  They completed their radiation on: 08/24/2023  Does the patient complain of any of the following: Chest pains/ SOB: No Headaches/ Dizziness: No Post radiation skin issues: Yes, raised lesion under the skin of superior region of LT forearm w/ NO irritation to the actual surface of the skin. Joint Pain: Yes 7/10 in bilateral knees Joint Swelling: No Range of Motion limitations: None Fatigue post radiation: Yes- moderate Appetite good/fair/poor: Good w/ approx 30lb weight gain over the course of 1 1/2 months  Additional comments if applicable: None  Vitals: BP 117/70 (BP Location: Left Arm, Patient Position: Sitting, Cuff Size: Normal)   Pulse 85   Temp 97.6 F (36.4 C) (Temporal)   Resp 20   Ht 6\' 4"  (1.93 m)   Wt 228 lb 9.6 oz (103.7 kg)   SpO2 100%   BMI 27.83 kg/m   This concludes the interaction.  Ruel Favors, LPN

## 2023-09-24 NOTE — Progress Notes (Signed)
Radiation Oncology         [phone] ________________________________  Name: Jerry Tran MRN: [id number]  Date: 09/25/2023  DOB: 1946-02-23  Follow-Up Visit Note  Outpatient  CC: Center, [facility]  [name], MD  Diagnosis and Prior Radiotherapy: No diagnosis found.   Metastatic Disease- Recent progression of mantle cell lymphoma involving the groin, left arm, and bilateral axillae.    CHIEF COMPLAINT: Here for follow-up and surveillance of mantle cell lymphoma.   Narrative:  The patient returns today for routine follow-up.  ***                              ALLERGIES:  is allergic to sildenafil.  Meds: Current Outpatient Medications  Medication Sig Dispense Refill   acyclovir (ZOVIRAX) 400 MG tablet TAKE 1 TABLET(400 MG) BY MOUTH DAILY 30 tablet 3   ammonium lactate (LAC-HYDRIN) 12 % lotion Apply 1 application topically 2 (two) times daily as needed for dry skin.     atorvastatin (LIPITOR) 20 MG tablet Take 10 mg by mouth at bedtime.     Cholecalciferol (VITAMIN D3 PO) Take 1 tablet by mouth every morning.     diclofenac Sodium (VOLTAREN) 1 % GEL Apply 2 g topically 4 (four) times daily as needed (pain).     feeding supplement (ENSURE ENLIVE / ENSURE PLUS) LIQD Take 237 mLs by mouth 2 (two) times daily between meals. 237 mL 12   flecainide (TAMBOCOR) 100 MG tablet Take 100 mg by mouth every 12 (twelve) hours.     fluticasone (FLONASE) 50 MCG/ACT nasal spray Place 1 spray into both nostrils daily as needed for allergies or rhinitis.     furosemide (LASIX) 20 MG tablet Take 20 mg by mouth.     hydrocortisone (ANUSOL-HC) 25 MG suppository Place 1 suppository (25 mg total) rectally 2 (two) times daily. 12 suppository 0   hydroxypropyl methylcellulose / hypromellose (ISOPTO TEARS / GONIOVISC) 2.5 % ophthalmic solution Place 2 drops into both eyes 2 (two) times daily as needed for dry eyes.     LORazepam (ATIVAN) 0.5 MG tablet Take 1 tablet (0.5 mg total) by  mouth every 6 (six) hours as needed (Nausea or vomiting). 30 tablet 0   magic mouthwash (multi-ingredient) oral suspension Take 5 mLs by mouth 4 times daily as needed for mouth and throat pain. 400 mL 1   magic mouthwash w/lidocaine SOLN Take 5 mLs by mouth 4 (four) times daily as needed for mouth pain (mouth and throat pain related to radiation). Compound 400 ml total volume with  80ml of Maalox 80ml of Nystatin 100,000 unit/ml 80ml of 2% viscous lidocaine 80ml of Diphenhydramine 12.5mg /48ml 80ml of Distilled water. 400 mL 1   metoprolol tartrate (LOPRESSOR) 25 MG tablet Take 12.5 mg by mouth 2 (two) times daily. Take with flecainide     mirtazapine (REMERON) 30 MG tablet Take 30 mg by mouth at bedtime.     Nutritional Supplements (ENSURE COMPLETE PO) Take 1 Container by mouth 2 (two) times daily.     nystatin (MYCOSTATIN) 100000 UNIT/ML suspension Take 5 mLs (500,000 Units total) by mouth 4 (four) times daily. 200 mL 2   omeprazole (PRILOSEC) 20 MG capsule Take 20 mg by mouth daily before breakfast.     pirtobrutinib (JAYPIRCA) 100 MG tablet Take 2 tablets (200 mg total) by mouth daily. 60 tablet 1   potassium chloride SA (KLOR-CON M) 20 MEQ tablet TAKE 1  TABLET(20 MEQ) BY MOUTH TWICE DAILY 180 tablet 2   prazosin (MINIPRESS) 2 MG capsule Take 6 mg by mouth at bedtime.     sucralfate (CARAFATE) 1 GM/10ML suspension Take 10 mLs by mouth 4 times daily -  with meals and at bedtime. 420 mL 0   terbinafine (LAMISIL) 1 % cream Apply 1 application topically 2 (two) times daily as needed (athlete's foot).     torsemide (DEMADEX) 10 MG tablet Take 20 mg by mouth daily.     triamcinolone cream (KENALOG) 0.1 % Apply 1 application topically 2 (two) times daily as needed (rash).     No current facility-administered medications for this encounter.    Physical Findings: The patient is in no acute distress. Patient is alert and oriented.  vitals were not taken for this visit. .      Lab Findings: Lab  Results  Component Value Date   WBC 2.3 (L) 09/07/2023   HGB 9.1 (L) 09/07/2023   HCT 29.4 (L) 09/07/2023   MCV 90.2 09/07/2023   PLT 110 (L) 09/07/2023    Radiographic Findings: No results found.  Impression/Plan:  ***  On date of service, in total, I spent *** minutes on this encounter. Patient was seen in person.  _____________________________________    Joyice Faster, PA-C

## 2023-09-25 ENCOUNTER — Encounter: Payer: Self-pay | Admitting: Radiation Oncology

## 2023-09-25 ENCOUNTER — Other Ambulatory Visit: Payer: Self-pay

## 2023-09-25 ENCOUNTER — Ambulatory Visit
Admission: RE | Admit: 2023-09-25 | Discharge: 2023-09-25 | Disposition: A | Discharge status: Home / Self Care | Payer: Medicare Other | Source: Ambulatory Visit | Attending: Radiation Oncology | Admitting: Radiation Oncology

## 2023-09-25 VITALS — BP 117/70 | HR 85 | Temp 97.6°F | Resp 20 | Ht 76.0 in | Wt 228.6 lb

## 2023-09-25 DIAGNOSIS — C8318 Mantle cell lymphoma, lymph nodes of multiple sites: Secondary | ICD-10-CM | POA: Insufficient documentation

## 2023-09-25 DIAGNOSIS — I890 Lymphedema, not elsewhere classified: Secondary | ICD-10-CM | POA: Diagnosis not present

## 2023-09-25 DIAGNOSIS — Z79899 Other long term (current) drug therapy: Secondary | ICD-10-CM | POA: Diagnosis not present

## 2023-09-25 DIAGNOSIS — Z79624 Long term (current) use of inhibitors of nucleotide synthesis: Secondary | ICD-10-CM | POA: Insufficient documentation

## 2023-09-26 ENCOUNTER — Other Ambulatory Visit (HOSPITAL_COMMUNITY): Payer: Self-pay

## 2023-09-26 ENCOUNTER — Telehealth: Payer: Self-pay | Admitting: Radiation Oncology

## 2023-09-26 ENCOUNTER — Encounter: Payer: Self-pay | Admitting: Radiation Oncology

## 2023-09-26 ENCOUNTER — Inpatient Hospital Stay: Payer: Medicare Other | Attending: Hematology and Oncology | Admitting: Hematology

## 2023-09-26 ENCOUNTER — Inpatient Hospital Stay: Payer: Medicare Other

## 2023-09-26 VITALS — BP 111/64 | HR 86 | Temp 97.5°F | Resp 18 | Wt 226.5 lb

## 2023-09-26 DIAGNOSIS — R911 Solitary pulmonary nodule: Secondary | ICD-10-CM | POA: Diagnosis not present

## 2023-09-26 DIAGNOSIS — C8318 Mantle cell lymphoma, lymph nodes of multiple sites: Secondary | ICD-10-CM | POA: Diagnosis present

## 2023-09-26 DIAGNOSIS — Z87891 Personal history of nicotine dependence: Secondary | ICD-10-CM | POA: Insufficient documentation

## 2023-09-26 DIAGNOSIS — D649 Anemia, unspecified: Secondary | ICD-10-CM | POA: Insufficient documentation

## 2023-09-26 DIAGNOSIS — R339 Retention of urine, unspecified: Secondary | ICD-10-CM | POA: Diagnosis not present

## 2023-09-26 DIAGNOSIS — Z8546 Personal history of malignant neoplasm of prostate: Secondary | ICD-10-CM | POA: Diagnosis not present

## 2023-09-26 DIAGNOSIS — N1832 Chronic kidney disease, stage 3b: Secondary | ICD-10-CM | POA: Diagnosis not present

## 2023-09-26 DIAGNOSIS — Z923 Personal history of irradiation: Secondary | ICD-10-CM | POA: Diagnosis not present

## 2023-09-26 DIAGNOSIS — D696 Thrombocytopenia, unspecified: Secondary | ICD-10-CM | POA: Insufficient documentation

## 2023-09-26 DIAGNOSIS — I480 Paroxysmal atrial fibrillation: Secondary | ICD-10-CM | POA: Diagnosis not present

## 2023-09-26 DIAGNOSIS — R350 Frequency of micturition: Secondary | ICD-10-CM | POA: Insufficient documentation

## 2023-09-26 DIAGNOSIS — N5089 Other specified disorders of the male genital organs: Secondary | ICD-10-CM | POA: Diagnosis not present

## 2023-09-26 DIAGNOSIS — Z79899 Other long term (current) drug therapy: Secondary | ICD-10-CM | POA: Diagnosis not present

## 2023-09-26 DIAGNOSIS — E43 Unspecified severe protein-calorie malnutrition: Secondary | ICD-10-CM | POA: Diagnosis not present

## 2023-09-26 LAB — CBC WITH DIFFERENTIAL (CANCER CENTER ONLY)
Abs Immature Granulocytes: 0.05 10*3/uL (ref 0.00–0.07)
Basophils Absolute: 0 10*3/uL (ref 0.0–0.1)
Basophils Relative: 1 %
Eosinophils Absolute: 0 10*3/uL (ref 0.0–0.5)
Eosinophils Relative: 1 %
HCT: 29.9 % — ABNORMAL LOW (ref 39.0–52.0)
Hemoglobin: 9.5 g/dL — ABNORMAL LOW (ref 13.0–17.0)
Immature Granulocytes: 2 %
Lymphocytes Relative: 18 %
Lymphs Abs: 0.5 10*3/uL — ABNORMAL LOW (ref 0.7–4.0)
MCH: 28 pg (ref 26.0–34.0)
MCHC: 31.8 g/dL (ref 30.0–36.0)
MCV: 88.2 fL (ref 80.0–100.0)
Monocytes Absolute: 0.3 10*3/uL (ref 0.1–1.0)
Monocytes Relative: 10 %
Neutro Abs: 2 10*3/uL (ref 1.7–7.7)
Neutrophils Relative %: 68 %
Platelet Count: 114 10*3/uL — ABNORMAL LOW (ref 150–400)
RBC: 3.39 MIL/uL — ABNORMAL LOW (ref 4.22–5.81)
RDW: 17.2 % — ABNORMAL HIGH (ref 11.5–15.5)
WBC Count: 2.9 10*3/uL — ABNORMAL LOW (ref 4.0–10.5)
nRBC: 0 % (ref 0.0–0.2)

## 2023-09-26 LAB — CMP (CANCER CENTER ONLY)
ALT: 16 U/L (ref 0–44)
AST: 38 U/L (ref 15–41)
Albumin: 4 g/dL (ref 3.5–5.0)
Alkaline Phosphatase: 68 U/L (ref 38–126)
Anion gap: 13 (ref 5–15)
BUN: 71 mg/dL — ABNORMAL HIGH (ref 8–23)
CO2: 20 mmol/L — ABNORMAL LOW (ref 22–32)
Calcium: 9.4 mg/dL (ref 8.9–10.3)
Chloride: 105 mmol/L (ref 98–111)
Creatinine: 2.78 mg/dL — ABNORMAL HIGH (ref 0.61–1.24)
GFR, Estimated: 23 mL/min — ABNORMAL LOW (ref >60)
Glucose, Bld: 122 mg/dL — ABNORMAL HIGH (ref 70–99)
Potassium: 4.1 mmol/L (ref 3.5–5.1)
Sodium: 138 mmol/L (ref 135–145)
Total Bilirubin: 0.4 mg/dL (ref <1.2)
Total Protein: 7.2 g/dL (ref 6.5–8.1)

## 2023-09-26 LAB — LACTATE DEHYDROGENASE: LDH: 553 U/L — ABNORMAL HIGH (ref 98–192)

## 2023-09-26 NOTE — Progress Notes (Signed)
HEMATOLOGY/ONCOLOGY CLINIC VISIT NOTE  Date of Service: 09/26/23  Patient Care Team: Center, [facility] as PCP - General (General Practice) [name], MD as Consulting Physician (Hematology)  CHIEF COMPLAINTS/PURPOSE OF CONSULTATION:  Follow-up for continued evaluation and management of relapsed mantle cell lymphoma  HISTORY OF PRESENTING ILLNESS:  Please see previous note for details of initial presentation  INTERVAL HISTORY  [name] is here for continued evaluation and management of his relapsed mantle cell lymphoma.   Patient was last seen by me on 08/24/2023 and reported fluctuating swelling of lymph nodes in the groin, excess foreskin, and stable mild leg swelling.   He presented to the ED on 09/20/2023 for increasing lymphedema.   He was seen by Dr. Alphonsa Gin to discuss details of CAR T-cell therapies. Patient has decided that he would not like to proceed with CAR T-cell therapy.   Today, he is accompanied by his wife. Patient reports that he is tolerating Protrutinib well with no major toxicities.   Patient reports that his groin swelling has worsened. He reports that the swelling does improve overnight and eventually returns. His lymph nodes in his groin appear very swollen upon physical examination. He denies any other new lumps or bumps. Patient has received radiation therapy to the large lymph node in the groin. Patient reports that there are plans for radiation therapy to his left forearm.  He reports frequent urination with use of diuretics. Patient does endorse urinary retention. He denies any constriction with regards to his urinary habits. His wife reports that patient's urologist recommended a catheter, which patient was not agreeable to. His wife reports that his urologist suggested that it would be okay to have a catheter placed next week.   His wife reports that his urologist believed that his foreskin was causing compression, and there was  consideration of circumcision.   MEDICAL HISTORY:  Past Medical History:  Diagnosis Date   Arthritis    Chronic kidney disease, stage 3b (HCC) 12/09/2021   Dyslipidemia    GERD (gastroesophageal reflux disease)    Hypertension    PAF (paroxysmal atrial fibrillation) (HCC)    Prostate cancer (HCC) 03/2021   s/p radiation therapy   PTSD (post-traumatic stress disorder)     SURGICAL HISTORY: Past Surgical History:  Procedure Laterality Date   IR IMAGING GUIDED PORT INSERTION  11/06/2022   LIPOMA EXCISION Right 12/09/2021   Procedure: RIGHT SUPERCLAVICULAR LYMPH NODE EXCISION;  Surgeon: Andria Meuse, MD;  Location: [facility] OR;  Service: General;  Laterality: Right;    SOCIAL HISTORY: Social History   Socioeconomic History   Marital status: Married    Spouse name: Not on file   Number of children: Not on file   Years of education: Not on file   Highest education level: Not on file  Occupational History   Occupation: retired  Tobacco Use   Smoking status: Former    Current packs/day: 1.00    Average packs/day: 1 pack/day for 15.0 years (15.0 ttl pk-yrs)    Types: Cigarettes   Smokeless tobacco: Never  Substance and Sexual Activity   Alcohol use: Not Currently    Comment: h/o heavy use   Drug use: Not Currently    Types: Marijuana, Cocaine    Comment: remote   Sexual activity: Not on file  Other Topics Concern   Not on file  Social History Narrative   Not on file   Social Determinants of Health   Financial Resource Strain: Not  on file  Food Insecurity: No Food Insecurity (04/03/2023)   Hunger Vital Sign    Worried About Running Out of Food in the Last Year: Never true    Ran Out of Food in the Last Year: Never true  Transportation Needs: No Transportation Needs (04/03/2023)   PRAPARE - Administrator, Civil Service (Medical): No    Lack of Transportation (Non-Medical): No  Physical Activity: Not on file  Stress: Not on file  Social Connections: Not on  file  Intimate Partner Violence: Not At Risk (04/03/2023)   Humiliation, Afraid, Rape, and Kick questionnaire    Fear of Current or Ex-Partner: No    Emotionally Abused: No    Physically Abused: No    Sexually Abused: No    FAMILY HISTORY: Family History  Problem Relation Age of Onset   Cancer Neg Hx     ALLERGIES:  is allergic to sildenafil.  MEDICATIONS:  Current Outpatient Medications  Medication Sig Dispense Refill   acyclovir (ZOVIRAX) 400 MG tablet TAKE 1 TABLET(400 MG) BY MOUTH DAILY 30 tablet 3   ammonium lactate (LAC-HYDRIN) 12 % lotion Apply 1 application topically 2 (two) times daily as needed for dry skin.     atorvastatin (LIPITOR) 20 MG tablet Take 10 mg by mouth at bedtime.     Cholecalciferol (VITAMIN D3 PO) Take 1 tablet by mouth every morning.     diclofenac Sodium (VOLTAREN) 1 % GEL Apply 2 g topically 4 (four) times daily as needed (pain).     feeding supplement (ENSURE ENLIVE / ENSURE PLUS) LIQD Take 237 mLs by mouth 2 (two) times daily between meals. 237 mL 12   flecainide (TAMBOCOR) 100 MG tablet Take 100 mg by mouth every 12 (twelve) hours.     fluticasone (FLONASE) 50 MCG/ACT nasal spray Place 1 spray into both nostrils daily as needed for allergies or rhinitis.     furosemide (LASIX) 20 MG tablet Take 20 mg by mouth.     hydrocortisone (ANUSOL-HC) 25 MG suppository Place 1 suppository (25 mg total) rectally 2 (two) times daily. 12 suppository 0   hydroxypropyl methylcellulose / hypromellose (ISOPTO TEARS / GONIOVISC) 2.5 % ophthalmic solution Place 2 drops into both eyes 2 (two) times daily as needed for dry eyes.     LORazepam (ATIVAN) 0.5 MG tablet Take 1 tablet (0.5 mg total) by mouth every 6 (six) hours as needed (Nausea or vomiting). 30 tablet 0   magic mouthwash (multi-ingredient) oral suspension Take 5 mLs by mouth 4 times daily as needed for mouth and throat pain. 400 mL 1   magic mouthwash w/lidocaine SOLN Take 5 mLs by mouth 4 (four) times daily as  needed for mouth pain (mouth and throat pain related to radiation). Compound 400 ml total volume with  80ml of Maalox 80ml of Nystatin 100,000 unit/ml 80ml of 2% viscous lidocaine 80ml of Diphenhydramine 12.5mg /74ml 80ml of Distilled water. 400 mL 1   metoprolol tartrate (LOPRESSOR) 25 MG tablet Take 12.5 mg by mouth 2 (two) times daily. Take with flecainide     mirtazapine (REMERON) 30 MG tablet Take 30 mg by mouth at bedtime.     Nutritional Supplements (ENSURE COMPLETE PO) Take 1 Container by mouth 2 (two) times daily.     nystatin (MYCOSTATIN) 100000 UNIT/ML suspension Take 5 mLs (500,000 Units total) by mouth 4 (four) times daily. 200 mL 2   omeprazole (PRILOSEC) 20 MG capsule Take 20 mg by mouth daily before breakfast.  pirtobrutinib (JAYPIRCA) 100 MG tablet Take 2 tablets (200 mg total) by mouth daily. 60 tablet 1   potassium chloride SA (KLOR-CON M) 20 MEQ tablet TAKE 1 TABLET(20 MEQ) BY MOUTH TWICE DAILY 180 tablet 2   prazosin (MINIPRESS) 2 MG capsule Take 6 mg by mouth at bedtime.     sucralfate (CARAFATE) 1 GM/10ML suspension Take 10 mLs by mouth 4 times daily -  with meals and at bedtime. 420 mL 0   terbinafine (LAMISIL) 1 % cream Apply 1 application topically 2 (two) times daily as needed (athlete's foot).     torsemide (DEMADEX) 10 MG tablet Take 20 mg by mouth daily.     triamcinolone cream (KENALOG) 0.1 % Apply 1 application topically 2 (two) times daily as needed (rash).     No current facility-administered medications for this visit.    REVIEW OF SYSTEMS:    10 Point review of Systems was done is negative except as noted above.   PHYSICAL EXAMINATION: .BP 111/64   Pulse 86   Temp (!) 97.5 F (36.4 C)   Resp 18   Wt 226 lb 8 oz (102.7 kg)   SpO2 100%   BMI 27.57 kg/m   GENERAL:alert, in no acute distress and comfortable SKIN: no acute rashes, no significant lesions EYES: conjunctiva are pink and non-injected, sclera anicteric OROPHARYNX: MMM, no exudates,  no oropharyngeal erythema or ulceration NECK: supple, no JVD LYMPH:  no palpable lymphadenopathy in the cervical, axillary or inguinal regions LUNGS: clear to auscultation b/l with normal respiratory effort HEART: regular rate & rhythm ABDOMEN:  normoactive bowel sounds , non tender, not distended. Extremity: no pedal edema PSYCH: alert & oriented x 3 with fluent speech NEURO: no focal motor/sensory deficits   LABORATORY DATA:  I have reviewed the data as listed     Latest Ref Rng & Units 09/07/2023    2:29 PM 08/24/2023   10:20 AM 07/25/2023   11:32 AM  CBC  WBC 4.0 - 10.5 K/uL 2.3  2.9  4.0   Hemoglobin 13.0 - 17.0 g/dL 9.1  8.5  21.3   Hematocrit 39.0 - 52.0 % 29.4  26.7  31.3   Platelets 150 - 400 K/uL 110  118  113       Latest Ref Rng & Units 09/07/2023    2:29 PM 08/24/2023   10:20 AM 07/25/2023   11:32 AM  CMP  Glucose 70 - 99 mg/dL 89  086  87   BUN 8 - 23 mg/dL 36  47  35   Creatinine 0.61 - 1.24 mg/dL 5.78  4.69  6.29   Sodium 135 - 145 mmol/L 139  138  141   Potassium 3.5 - 5.1 mmol/L 4.0  4.4  4.0   Chloride 98 - 111 mmol/L 110  109  111   CO2 22 - 32 mmol/L 22  27  26    Calcium 8.9 - 10.3 mg/dL 9.3  9.2  9.2   Total Protein 6.5 - 8.1 g/dL 6.9  6.4  6.3   Total Bilirubin <1.2 mg/dL 0.6  0.6  0.4   Alkaline Phos 38 - 126 U/L 64  72  61   AST 15 - 41 U/L 26  47  29   ALT 0 - 44 U/L 13  10  11     Lab Results  Component Value Date   LDH 1,404 (H) 08/24/2023    Surgical Pathology 10/16/2022: A. LYMPH NODE, LEFT INGUINAL, NEEDLE CORE BIOPSY:  -Non-Hodgkin  B-cell lymphoma consistent with mantle cell lymphoma    Surgical Pathology 12/16/2021: Flow Pathology Report  Clinical history: Mantle Cell Lymphoma of lymph nodes of multiple  regions  DIAGNOSIS:  -Monoclonal B-cell population identified  -See comment  COMMENT:  The findings are consistent with involvement by previously known mantle  cell lymphoma.    RADIOGRAPHIC STUDIES: I have personally  reviewed the radiological images as listed and agreed with the findings in the report.  CT Chest/abd/pelvis 7/30:  IMPRESSION: 1. Interval enlargement of previously demonstrated bilateral inguinal adenopathy consistent with progressive lymphoma. 2. New mildly enlarged right axillary lymph node and right external iliac lymph node, also suspicious for lymphoma. 3. New small right lower lobe pulmonary nodule, possibly a lymph node. Recommend attention on follow-up. 4. No other evidence of lymphoma in the chest, abdomen or pelvis. 5. Stable incidental findings including small pericardial effusion, hepatic and renal cysts and mild lumbar spondylosis. Mildly prominent stool throughout the colon suggesting constipation. 6.  Aortic Atherosclerosis (ICD10-I70.0).     Electronically Signed   By: Carey Bullocks M.D.   On: 05/29/2023 12:55   ASSESSMENT & PLAN:   77 y.o.  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  3) moderate to severe protein calorie malnutrition-He has lost 8lbs but is drinking x2-3 Ensure drinks per week.  Wt Readings from Last 3 Encounters:  09/24/23 228 lb 9.6 oz (103.7 kg)  09/07/23 214 lb (97.1 kg)  08/24/23 218 lb 9.6 oz (99.2 kg)   4) Loose stools- He is having a few small, loose stools per day.  5) BLE swelling- resolved with compression socks.  6) Hemorrhoids- He reports pain with his hemorrhoid flare but denies any rectal bleeding. He has not seen GI or surgeon for this problem, but [facility]   7) Hoarse voice- He reports more difficulty projecting his voice. Denies any noticeable reflux but he does take acid suppression medication. He denies any significant seasonal allergies.  PLAN:  -Patient has seen Dr. Alphonsa Gin and patient reports that he would not like to proceed with CAR T cell therapy -discussed that CAR T cell therapy would be the best treatment option for progression of mantle cell lymphoma. Discussed that CAR T-cell therapy generally would have a 50-60% response rate -discussed that given his aggressive blastoid variant of mantle cell lymphoma, if patient would not like to consider CAR T-cell therapy, there would not be many other treatment options remaining and there would be a role to consider hospice care to focus on comfort.  -discussed primary concern that his mantle cell lymphoma is progressing causing worsening symptoms, including more leg and scrotal swelling -discussed that there may be a role for a catheter, decompression of foreskin, or both -discussed that his swelling could be due to a combination of factors, such as: His protein levels in the blood dropping Lymph nodes are not draining lymph quick enough The veins are not draining quick enough -discussed that while Lasix could improve swelling, Lasix may not improve symptoms if there is  plenty of lymphoma in the lymph nodes -would recommend patient to be admitted to the ED today due to multiple considerations, including: Consideration of IV diuretics to remove extra fluid and reduce swelling Address scrotal swelling limiting his urination and causing pain Scan to evaluate lymphoma status -patient reports that he would like to wait until next week to make an official decision regarding next steps and would not like to present to the ED at this time.  -advised patient to present to the ED if he is uncomfortable. Discussed that he would likely be able to have a CT scan  sooner as an inpatient compared to as an outpatient.  -Discussed that if the CT scan shows signs of progression,  there would be a need to have a plan in place due to worsening symptoms -recommend patient to connect with urologist to at least have a catheter placed to address his urinary retention -will blood tests today including to evaluate kidney function -discussed option of pain medications, which patient would not like at this time -He has been tolerating Pirtobrutinib well with no major toxicity issues  -continue Pirtobrutinib at this time -answered all of patient's and his wife's questions in detail  FOLLOW-UP: Recommended going to ED  The total time spent in the appointment was 40 minutes* .  All of the patient's questions were answered with apparent satisfaction. The patient knows to call the clinic with any problems, questions or concerns.   Wyvonnia Lora MD MS AAHIVMS [facility] Hematology/Oncology Physician [facility]  .*Total Encounter Time as defined by the Centers for Medicare and Medicaid Services includes, in addition to the face-to-face time of a patient visit (documented in the note above) non-face-to-face time: obtaining and reviewing outside history, ordering and reviewing medications, tests or procedures, care coordination (communications with other health care professionals or caregivers) and documentation in the medical record.   I,Mitra Faeizi,acting as a Neurosurgeon for Wyvonnia Lora, MD.,have documented all relevant documentation on the behalf of Wyvonnia Lora, MD,as directed by  Wyvonnia Lora, MD while in the presence of Wyvonnia Lora, MD.  .I have reviewed the above documentation for accuracy and completeness, and I agree with the above. [name] MD

## 2023-09-26 NOTE — Telephone Encounter (Signed)
Left message for patient to call back to schedule CT Sim per 11/26 inbasket message.

## 2023-09-28 ENCOUNTER — Encounter (HOSPITAL_COMMUNITY): Payer: Self-pay

## 2023-09-28 ENCOUNTER — Emergency Department (HOSPITAL_COMMUNITY): Payer: No Typology Code available for payment source

## 2023-09-28 ENCOUNTER — Other Ambulatory Visit: Payer: Self-pay | Admitting: Hematology

## 2023-09-28 ENCOUNTER — Other Ambulatory Visit (HOSPITAL_BASED_OUTPATIENT_CLINIC_OR_DEPARTMENT_OTHER): Payer: Self-pay

## 2023-09-28 ENCOUNTER — Other Ambulatory Visit: Payer: Self-pay

## 2023-09-28 ENCOUNTER — Inpatient Hospital Stay (HOSPITAL_COMMUNITY)
Admission: EM | Admit: 2023-09-28 | Discharge: 2023-10-22 | DRG: 823 | Disposition: A | Discharge status: Skilled Nursing Facility | Payer: No Typology Code available for payment source | Attending: Internal Medicine | Admitting: Internal Medicine

## 2023-09-28 DIAGNOSIS — I5032 Chronic diastolic (congestive) heart failure: Secondary | ICD-10-CM | POA: Diagnosis present

## 2023-09-28 DIAGNOSIS — N390 Urinary tract infection, site not specified: Secondary | ICD-10-CM | POA: Diagnosis not present

## 2023-09-28 DIAGNOSIS — C8318 Mantle cell lymphoma, lymph nodes of multiple sites: Principal | ICD-10-CM | POA: Diagnosis present

## 2023-09-28 DIAGNOSIS — E871 Hypo-osmolality and hyponatremia: Secondary | ICD-10-CM | POA: Diagnosis not present

## 2023-09-28 DIAGNOSIS — Z7901 Long term (current) use of anticoagulants: Secondary | ICD-10-CM

## 2023-09-28 DIAGNOSIS — K219 Gastro-esophageal reflux disease without esophagitis: Secondary | ICD-10-CM | POA: Diagnosis present

## 2023-09-28 DIAGNOSIS — R601 Generalized edema: Secondary | ICD-10-CM

## 2023-09-28 DIAGNOSIS — D62 Acute posthemorrhagic anemia: Secondary | ICD-10-CM | POA: Diagnosis not present

## 2023-09-28 DIAGNOSIS — Z888 Allergy status to other drugs, medicaments and biological substances status: Secondary | ICD-10-CM

## 2023-09-28 DIAGNOSIS — I82411 Acute embolism and thrombosis of right femoral vein: Secondary | ICD-10-CM | POA: Diagnosis not present

## 2023-09-28 DIAGNOSIS — E876 Hypokalemia: Secondary | ICD-10-CM | POA: Diagnosis not present

## 2023-09-28 DIAGNOSIS — Z7189 Other specified counseling: Secondary | ICD-10-CM

## 2023-09-28 DIAGNOSIS — R600 Localized edema: Secondary | ICD-10-CM | POA: Diagnosis not present

## 2023-09-28 DIAGNOSIS — D731 Hypersplenism: Secondary | ICD-10-CM | POA: Diagnosis present

## 2023-09-28 DIAGNOSIS — N1330 Unspecified hydronephrosis: Secondary | ICD-10-CM | POA: Diagnosis not present

## 2023-09-28 DIAGNOSIS — N1832 Chronic kidney disease, stage 3b: Secondary | ICD-10-CM | POA: Diagnosis present

## 2023-09-28 DIAGNOSIS — K683 Retroperitoneal hematoma: Secondary | ICD-10-CM | POA: Diagnosis not present

## 2023-09-28 DIAGNOSIS — D631 Anemia in chronic kidney disease: Secondary | ICD-10-CM | POA: Diagnosis present

## 2023-09-28 DIAGNOSIS — Z79899 Other long term (current) drug therapy: Secondary | ICD-10-CM

## 2023-09-28 DIAGNOSIS — D6481 Anemia due to antineoplastic chemotherapy: Secondary | ICD-10-CM | POA: Diagnosis present

## 2023-09-28 DIAGNOSIS — R591 Generalized enlarged lymph nodes: Secondary | ICD-10-CM | POA: Diagnosis present

## 2023-09-28 DIAGNOSIS — Z515 Encounter for palliative care: Secondary | ICD-10-CM | POA: Diagnosis not present

## 2023-09-28 DIAGNOSIS — N138 Other obstructive and reflux uropathy: Secondary | ICD-10-CM | POA: Diagnosis present

## 2023-09-28 DIAGNOSIS — I130 Hypertensive heart and chronic kidney disease with heart failure and stage 1 through stage 4 chronic kidney disease, or unspecified chronic kidney disease: Secondary | ICD-10-CM | POA: Diagnosis present

## 2023-09-28 DIAGNOSIS — I2699 Other pulmonary embolism without acute cor pulmonale: Secondary | ICD-10-CM | POA: Diagnosis not present

## 2023-09-28 DIAGNOSIS — M1990 Unspecified osteoarthritis, unspecified site: Secondary | ICD-10-CM | POA: Diagnosis present

## 2023-09-28 DIAGNOSIS — E785 Hyperlipidemia, unspecified: Secondary | ICD-10-CM | POA: Diagnosis present

## 2023-09-28 DIAGNOSIS — T451X5A Adverse effect of antineoplastic and immunosuppressive drugs, initial encounter: Secondary | ICD-10-CM | POA: Diagnosis not present

## 2023-09-28 DIAGNOSIS — Z8619 Personal history of other infectious and parasitic diseases: Secondary | ICD-10-CM

## 2023-09-28 DIAGNOSIS — E8721 Acute metabolic acidosis: Secondary | ICD-10-CM | POA: Diagnosis not present

## 2023-09-28 DIAGNOSIS — J9601 Acute respiratory failure with hypoxia: Secondary | ICD-10-CM | POA: Diagnosis not present

## 2023-09-28 DIAGNOSIS — K5730 Diverticulosis of large intestine without perforation or abscess without bleeding: Secondary | ICD-10-CM | POA: Diagnosis present

## 2023-09-28 DIAGNOSIS — D6959 Other secondary thrombocytopenia: Secondary | ICD-10-CM | POA: Diagnosis present

## 2023-09-28 DIAGNOSIS — E861 Hypovolemia: Secondary | ICD-10-CM | POA: Diagnosis not present

## 2023-09-28 DIAGNOSIS — Z66 Do not resuscitate: Secondary | ICD-10-CM | POA: Diagnosis not present

## 2023-09-28 DIAGNOSIS — N1831 Chronic kidney disease, stage 3a: Secondary | ICD-10-CM | POA: Diagnosis present

## 2023-09-28 DIAGNOSIS — E440 Moderate protein-calorie malnutrition: Secondary | ICD-10-CM | POA: Diagnosis present

## 2023-09-28 DIAGNOSIS — I480 Paroxysmal atrial fibrillation: Secondary | ICD-10-CM | POA: Diagnosis present

## 2023-09-28 DIAGNOSIS — R319 Hematuria, unspecified: Secondary | ICD-10-CM | POA: Diagnosis not present

## 2023-09-28 DIAGNOSIS — Z923 Personal history of irradiation: Secondary | ICD-10-CM

## 2023-09-28 DIAGNOSIS — Z87891 Personal history of nicotine dependence: Secondary | ICD-10-CM

## 2023-09-28 DIAGNOSIS — I252 Old myocardial infarction: Secondary | ICD-10-CM

## 2023-09-28 DIAGNOSIS — G4733 Obstructive sleep apnea (adult) (pediatric): Secondary | ICD-10-CM | POA: Diagnosis present

## 2023-09-28 DIAGNOSIS — K5900 Constipation, unspecified: Secondary | ICD-10-CM | POA: Diagnosis present

## 2023-09-28 DIAGNOSIS — I959 Hypotension, unspecified: Secondary | ICD-10-CM | POA: Diagnosis present

## 2023-09-28 DIAGNOSIS — I10 Essential (primary) hypertension: Secondary | ICD-10-CM | POA: Diagnosis present

## 2023-09-28 DIAGNOSIS — N170 Acute kidney failure with tubular necrosis: Secondary | ICD-10-CM | POA: Diagnosis not present

## 2023-09-28 DIAGNOSIS — M7989 Other specified soft tissue disorders: Secondary | ICD-10-CM | POA: Diagnosis not present

## 2023-09-28 DIAGNOSIS — I82432 Acute embolism and thrombosis of left popliteal vein: Secondary | ICD-10-CM | POA: Diagnosis not present

## 2023-09-28 DIAGNOSIS — B965 Pseudomonas (aeruginosa) (mallei) (pseudomallei) as the cause of diseases classified elsewhere: Secondary | ICD-10-CM | POA: Diagnosis not present

## 2023-09-28 DIAGNOSIS — Z6827 Body mass index (BMI) 27.0-27.9, adult: Secondary | ICD-10-CM

## 2023-09-28 DIAGNOSIS — N131 Hydronephrosis with ureteral stricture, not elsewhere classified: Secondary | ICD-10-CM | POA: Diagnosis not present

## 2023-09-28 DIAGNOSIS — F4310 Post-traumatic stress disorder, unspecified: Secondary | ICD-10-CM | POA: Diagnosis present

## 2023-09-28 DIAGNOSIS — Z8546 Personal history of malignant neoplasm of prostate: Secondary | ICD-10-CM

## 2023-09-28 DIAGNOSIS — D6832 Hemorrhagic disorder due to extrinsic circulating anticoagulants: Secondary | ICD-10-CM | POA: Diagnosis not present

## 2023-09-28 DIAGNOSIS — D61818 Other pancytopenia: Secondary | ICD-10-CM | POA: Diagnosis present

## 2023-09-28 DIAGNOSIS — N179 Acute kidney failure, unspecified: Secondary | ICD-10-CM | POA: Diagnosis not present

## 2023-09-28 DIAGNOSIS — R579 Shock, unspecified: Secondary | ICD-10-CM | POA: Diagnosis not present

## 2023-09-28 LAB — URINALYSIS, ROUTINE W REFLEX MICROSCOPIC
Bilirubin Urine: NEGATIVE
Glucose, UA: NEGATIVE mg/dL
Hgb urine dipstick: NEGATIVE
Ketones, ur: NEGATIVE mg/dL
Leukocytes,Ua: NEGATIVE
Nitrite: NEGATIVE
Protein, ur: NEGATIVE mg/dL
Specific Gravity, Urine: 1.014 (ref 1.005–1.030)
pH: 5 (ref 5.0–8.0)

## 2023-09-28 LAB — HEPATIC FUNCTION PANEL
ALT: 15 U/L (ref 0–44)
AST: 39 U/L (ref 15–41)
Albumin: 3.7 g/dL (ref 3.5–5.0)
Alkaline Phosphatase: 63 U/L (ref 38–126)
Bilirubin, Direct: <0.1 mg/dL (ref 0.0–0.2)
Total Bilirubin: 0.4 mg/dL (ref <1.2)
Total Protein: 6.8 g/dL (ref 6.5–8.1)

## 2023-09-28 LAB — CBC WITH DIFFERENTIAL/PLATELET
Abs Immature Granulocytes: 0.12 10*3/uL — ABNORMAL HIGH (ref 0.00–0.07)
Basophils Absolute: 0 10*3/uL (ref 0.0–0.1)
Basophils Relative: 1 %
Eosinophils Absolute: 0 10*3/uL (ref 0.0–0.5)
Eosinophils Relative: 2 %
HCT: 30.6 % — ABNORMAL LOW (ref 39.0–52.0)
Hemoglobin: 9.9 g/dL — ABNORMAL LOW (ref 13.0–17.0)
Immature Granulocytes: 5 %
Lymphocytes Relative: 17 %
Lymphs Abs: 0.5 10*3/uL — ABNORMAL LOW (ref 0.7–4.0)
MCH: 28.7 pg (ref 26.0–34.0)
MCHC: 32.4 g/dL (ref 30.0–36.0)
MCV: 88.7 fL (ref 80.0–100.0)
Monocytes Absolute: 0.4 10*3/uL (ref 0.1–1.0)
Monocytes Relative: 13 %
Neutro Abs: 1.7 10*3/uL (ref 1.7–7.7)
Neutrophils Relative %: 62 %
Platelets: 106 10*3/uL — ABNORMAL LOW (ref 150–400)
RBC: 3.45 MIL/uL — ABNORMAL LOW (ref 4.22–5.81)
RDW: 17.3 % — ABNORMAL HIGH (ref 11.5–15.5)
WBC: 2.7 10*3/uL — ABNORMAL LOW (ref 4.0–10.5)
nRBC: 0 % (ref 0.0–0.2)

## 2023-09-28 LAB — BASIC METABOLIC PANEL
Anion gap: 10 (ref 5–15)
BUN: 87 mg/dL — ABNORMAL HIGH (ref 8–23)
CO2: 20 mmol/L — ABNORMAL LOW (ref 22–32)
Calcium: 9.2 mg/dL (ref 8.9–10.3)
Chloride: 106 mmol/L (ref 98–111)
Creatinine, Ser: 3.08 mg/dL — ABNORMAL HIGH (ref 0.61–1.24)
GFR, Estimated: 20 mL/min — ABNORMAL LOW (ref >60)
Glucose, Bld: 100 mg/dL — ABNORMAL HIGH (ref 70–99)
Potassium: 4.3 mmol/L (ref 3.5–5.1)
Sodium: 136 mmol/L (ref 135–145)

## 2023-09-28 LAB — LACTATE DEHYDROGENASE: LDH: 547 U/L — ABNORMAL HIGH (ref 98–192)

## 2023-09-28 LAB — PROTIME-INR
INR: 1.1 (ref 0.8–1.2)
Prothrombin Time: 14.1 s (ref 11.4–15.2)

## 2023-09-28 LAB — TROPONIN I (HIGH SENSITIVITY): Troponin I (High Sensitivity): 8 ng/L (ref <18)

## 2023-09-28 LAB — BRAIN NATRIURETIC PEPTIDE: B Natriuretic Peptide: 44.2 pg/mL (ref 0.0–100.0)

## 2023-09-28 LAB — SODIUM, URINE, RANDOM: Sodium, Ur: 10 mmol/L

## 2023-09-28 LAB — CREATININE, URINE, RANDOM: Creatinine, Urine: 147 mg/dL

## 2023-09-28 MED ORDER — PIRTOBRUTINIB 100 MG PO TABS
200.0000 mg | ORAL_TABLET | Freq: Every day | ORAL | 1 refills | Status: DC
  Filled 2023-09-28: qty 60, 30d supply, fill #0

## 2023-09-28 NOTE — ED Provider Notes (Signed)
[place] EMERGENCY DEPARTMENT AT [facility] Provider Note   CSN: [id number] Arrival date & time: 09/28/23  1959     History  Chief Complaint  Patient presents with   Leg Swelling    Jerry Tran is a 77 y.o. male.  With a history of CKD, mantle cell lymphoma, CHF, paroxysmal A-fib on metoprolol presenting to the emergency department for evaluation of bilateral lower extremity swelling to the level of the groin.  He follows with Dr. Candise Che with radiation oncology.  He reports that Dr. Candise Che recommended admission and CT abdomen/pelvis 12 weeks ago 2 days ago.  Prior to this appointment 2 days ago, they followed up with urology who  reportedly recommended urinary catheterization to relieve pressure.  He reports declining both of these requests at that time but states he is now agreeable to the plan.  He reports his penis and testicles have continuously increase in swelling over the past 2 weeks.  He denies any pain to this area.  He is still able to urinate without difficulty.  He denies any fevers or chills.  He reports stiffness in bilateral knees secondary to the swelling.  He reports doubling his dose of Lasix at home 2 days ago which has made him urinate more.  HPI     Home Medications Prior to Admission medications   Medication Sig Start Date End Date Taking? Authorizing Provider  acyclovir (ZOVIRAX) 400 MG tablet TAKE 1 TABLET(400 MG) BY MOUTH DAILY 09/04/23   [name], MD  ammonium lactate (LAC-HYDRIN) 12 % lotion Apply 1 application topically 2 (two) times daily as needed for dry skin.    [provider]  atorvastatin (LIPITOR) 20 MG tablet Take 10 mg by mouth at bedtime.    [provider]  Cholecalciferol (VITAMIN D3 PO) Take 1 tablet by mouth every morning.    [provider]  diclofenac Sodium (VOLTAREN) 1 % GEL Apply 2 g topically 4 (four) times daily as needed (pain).    [provider]  feeding supplement (ENSURE  ENLIVE / ENSURE PLUS) LIQD Take 237 mLs by mouth 2 (two) times daily between meals. 12/11/21   Regalado, Belkys A, MD  flecainide (TAMBOCOR) 100 MG tablet Take 100 mg by mouth every 12 (twelve) hours.    [provider]  fluticasone (FLONASE) 50 MCG/ACT nasal spray Place 1 spray into both nostrils daily as needed for allergies or rhinitis.    [provider]  furosemide (LASIX) 20 MG tablet Take 20 mg by mouth.    [provider]  hydrocortisone (ANUSOL-HC) 25 MG suppository Place 1 suppository (25 mg total) rectally 2 (two) times daily. 01/30/23   Briant Cedar, PA-C  hydroxypropyl methylcellulose / hypromellose (ISOPTO TEARS / GONIOVISC) 2.5 % ophthalmic solution Place 2 drops into both eyes 2 (two) times daily as needed for dry eyes.    [provider]  LORazepam (ATIVAN) 0.5 MG tablet Take 1 tablet (0.5 mg total) by mouth every 6 (six) hours as needed (Nausea or vomiting). 12/16/21   [name], MD  magic mouthwash (multi-ingredient) oral suspension Take 5 mLs by mouth 4 times daily as needed for mouth and throat pain. 04/26/23   [name], MD  magic mouthwash w/lidocaine SOLN Take 5 mLs by mouth 4 (four) times daily as needed for mouth pain (mouth and throat pain related to radiation). Compound 400 ml total volume with  80ml of Maalox 80ml of Nystatin 100,000 unit/ml 80ml of 2%  viscous lidocaine 80ml of Diphenhydramine 12.5mg /28ml 80ml of Distilled water. 04/26/23   [name], MD  metoprolol tartrate (LOPRESSOR) 25 MG tablet Take 12.5 mg by mouth 2 (two) times daily. Take with flecainide    [provider]  mirtazapine (REMERON) 30 MG tablet Take 30 mg by mouth at bedtime. 01/31/22   [provider]  Nutritional Supplements (ENSURE COMPLETE PO) Take 1 Container by mouth 2 (two) times daily. 07/26/23   [provider]  nystatin (MYCOSTATIN) 100000 UNIT/ML suspension Take 5 mLs (500,000 Units total) by mouth 4  (four) times daily. 03/23/23   [name], MD  omeprazole (PRILOSEC) 20 MG capsule Take 20 mg by mouth daily before breakfast.    [provider]  pirtobrutinib (JAYPIRCA) 100 MG tablet Take 2 tablets (200 mg total) by mouth daily. 09/28/23   [name], MD  potassium chloride SA (KLOR-CON M) 20 MEQ tablet TAKE 1 TABLET(20 MEQ) BY MOUTH TWICE DAILY 03/01/23   [name], MD  prazosin (MINIPRESS) 2 MG capsule Take 6 mg by mouth at bedtime.    [provider]  sucralfate (CARAFATE) 1 GM/10ML suspension Take 10 mLs by mouth 4 times daily -  with meals and at bedtime. 05/14/23   Walisiewicz, Yvonna Alanis E, PA-C  terbinafine (LAMISIL) 1 % cream Apply 1 application topically 2 (two) times daily as needed (athlete's foot).    [provider]  torsemide (DEMADEX) 10 MG tablet Take 20 mg by mouth daily.    [provider]  triamcinolone cream (KENALOG) 0.1 % Apply 1 application topically 2 (two) times daily as needed (rash).    [provider]      Allergies    Sildenafil    Review of Systems   Review of Systems  Genitourinary:  Positive for penile swelling and scrotal swelling.  Musculoskeletal:  Positive for joint swelling.  All other systems reviewed and are negative.   Physical Exam Updated Vital Signs BP 121/79   Pulse 94   Temp 97.7 F (36.5 C) (Oral)   Resp 20   Ht 6\' 4"  (1.93 m)   Wt 104.3 kg   SpO2 100%   BMI 28.00 kg/m  Physical Exam Vitals and nursing note reviewed. Exam conducted with a chaperone present Lamar Laundry, RN).  Constitutional:      General: He is not in acute distress.    Appearance: He is well-developed. He is not ill-appearing, toxic-appearing or diaphoretic.     Comments: Resting comfortably in bed  HENT:     Head: Normocephalic and atraumatic.  Eyes:     Conjunctiva/sclera: Conjunctivae normal.  Cardiovascular:     Rate and Rhythm: Normal rate and regular rhythm.     Heart sounds: No murmur  heard. Pulmonary:     Effort: Pulmonary effort is normal. No respiratory distress.     Breath sounds: Normal breath sounds.  Abdominal:     Palpations: Abdomen is soft.     Tenderness: There is no abdominal tenderness.  Genitourinary:    Comments: Markedly edematous penile shaft and scrotum.  Scrotal swelling is equal bilaterally.  Easily retractable foreskin.  No overlying erythema or TTP. Musculoskeletal:        General: Swelling present.     Cervical back: Neck supple.     Comments: Significant swelling to bilateral lower extremities to mid thigh  Skin:    General: Skin is warm and dry.     Capillary Refill: Capillary refill takes less than 2  seconds.  Neurological:     Mental Status: He is alert.  Psychiatric:        Mood and Affect: Mood normal.     ED Results / Procedures / Treatments   Labs (all labs ordered are listed, but only abnormal results are displayed) Labs Reviewed  BASIC METABOLIC PANEL - Abnormal; Notable for the following components:      Result Value   CO2 20 (*)    Glucose, Bld 100 (*)    BUN 87 (*)    Creatinine, Ser 3.08 (*)    GFR, Estimated 20 (*)    All other components within normal limits  CBC WITH DIFFERENTIAL/PLATELET  BRAIN NATRIURETIC PEPTIDE  LACTATE DEHYDROGENASE    EKG None  Radiology CT ABDOMEN PELVIS WO CONTRAST  Result Date: 09/28/2023 CLINICAL DATA:  Lower extremity and groin swelling for 2 weeks with shortness of breath. Most recent PET-CT with extensive bulky abdominopelvic adenopathy. Currently undergoing XRT for lymphoma. EXAM: CT ABDOMEN AND PELVIS WITHOUT CONTRAST TECHNIQUE: Multidetector CT imaging of the abdomen and pelvis was performed following the standard protocol without IV contrast. RADIATION DOSE REDUCTION: This exam was performed according to the departmental dose-optimization program which includes automated exposure control, adjustment of the mA and/or kV according to patient size and/or use of iterative  reconstruction technique. COMPARISON:  PET-CT 08/03/2023, CT chest, abdomen and pelvis with IV contrast 05/29/2023. FINDINGS: Lower chest: No acute abnormality. No lung nodules. The cardiac size is normal. The coronary blood pool is less dense than the myocardium consistent with anemia. No pericardial effusion. Hepatobiliary: There are several small scattered hepatic cysts, with no follow-up imaging required. No new liver abnormality is seen without contrast. The gallbladder and bile ducts are unremarkable. Pancreas: Unremarkable without contrast. Spleen: Unremarkable without contrast.  No splenomegaly. Adrenals/Urinary Tract: There is no adrenal mass. No contour deforming abnormality of either unenhanced kidneys. Interval new mild bilateral hydroureteronephrosis. There is no urinary stone. This is probably due to compressive effect related to extensive progressive bilateral retroperitoneal adenopathy. The bladder is not fully distended but does appear increasingly thickened, which could be due to cystitis, nondistention, or XRT. Stomach/Bowel: No dilatation or wall thickening is seen without contrast. There is mild-to-moderate fecal stasis. Diverticulosis is noted without focal colitis or diverticulitis. The appendix is normal. Vascular/Lymphatic: Mild aortic atherosclerosis. No AAA. Extensive progressive retroperitoneal adenopathy. There are multilevel bulky retroperitoneal lymph nodes. No adenopathy was previously seen along the aorta and IVC This is suboptimally evaluated without contrast. Example lymph nodes are as follows: New retrocaval lymph node measuring 2.7 x 1.9 cm on 2:27; New aortocaval lymph node measuring 3.2 x 3.4 cm on 2:30; Numerous other new lymph nodes around the between the IVC and aorta are also noted. There are multiple new left periaortic chain nodes, largest of these is 5.4 x 3.2 cm on 2:36. There is extensive progressive adenopathy in the pelvis. Examples include: A right common iliac  chain node measuring 4.9 x 3.9 cm on 2:46, was previously 3.3 x 2.0 cm; Bulky pelvic sidewall nodes with additional progressive lymph nodes in the more distal common iliac chains, for example a left common iliac chain lymph node is estimated 4.9 x 3.5 cm on 2:53, was previously 2.1 x 1.8 cm; There are bulky bilateral external iliac chain nodes. On the right the largest is 6.1 x 4.6 cm on 2:65, previously 6 x 4.5 cm; Largest of the left external iliac chain nodes is 6.2 x 4.9 cm on 2:61, previously was 5  x 3.7 cm. Bulky bilateral inguinal adenopathy is again noted, largest slightly improved on the right, now 5.6 x 4.0 cm on 2:88, previously was 5.7 x 4.6 cm. Largest left inguinal chain node is less dense than previously possibly due to XRT, today measuring 3.9 x 3.0 cm on 2:84, was previously 5.7 x 4.8 cm. Lymph nodes in medial upper thighs there also slightly improved, on the left measuring 3.6 x 3.0 cm on 2:110, previously 4.0 x 3.6 cm. At the same level on the right, a medial upper thigh lymph node measures 3.0 x 2.6 cm on 2:105, was previously 5.7 x 4.4 cm. 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 The prostate is not enlarged. Other: Minimal pelvic ascites. Diffuse worsening body wall anasarca which could be due to malnutrition, hepatic dysfunction, or fluid overload. There is severe penoscrotal edema, small hydroceles. Mild diffuse haziness to the mesentery is also noted but without focality. Musculoskeletal: Advanced degenerative change lumbar spine. Mild hip DJD. No metastatic bone lesion is seen. Ankylosis right SI joint. IMPRESSION: 1. Extensive progressive retroperitoneal and pelvic adenopathy, with some slightly improved inguinal and medial upper thigh adenopathy. 2. Interval new mild bilateral hydroureteronephrosis, probably due to compressive effect from new extensive retroperitoneal adenopathy. 3. Increasing bladder wall thickening which could be due to  cystitis, nondistention, or XRT. 4. Constipation and diverticulosis. 5. Minimal pelvic ascites. 6. Worsening body wall anasarca which could be due to malnutrition, hepatic dysfunction, or fluid overload. 7. Severe penoscrotal edema, small hydroceles. 8. Aortic atherosclerosis. Aortic Atherosclerosis (ICD10-I70.0). Electronically Signed   By: Almira Bar M.D.   On: 09/28/2023 21:55   DG Chest Port 1 View  Result Date: 09/28/2023 CLINICAL DATA:  Shortness of breath with history of lymphoma. Receiving XRT for lymphoma. EXAM: PORTABLE CHEST 1 VIEW COMPARISON:  Chest CT with contrast 05/29/2023 FINDINGS: Right IJ port catheter again terminates at about the superior cavoatrial junction. A skin fold overlies the outer right upper lung field simulating a pneumothorax. No pneumothorax is seen. The lungs are clear. The sulci are sharp. There is mild cardiomegaly. No vascular congestion is seen. Stable mediastinum with aortic tortuosity and atherosclerosis. DJD both shoulders with no new osseous findings. IMPRESSION: 1. No evidence of acute chest disease. 2. Mild cardiomegaly. 3. Aortic atherosclerosis. 4. Right IJ port catheter. Electronically Signed   By: Almira Bar M.D.   On: 09/28/2023 21:17    Procedures Procedures    Medications Ordered in ED Medications - No data to display  ED Course/ Medical Decision Making/ A&P Clinical Course as of 09/28/23 2204  Fri Sep 28, 2023  2121 Discussed with oncology Dr. Arlana Pouch.  He requests secure chat message after CT abdomen pelvis is resulted for disposition planning [AS]    Clinical Course User Index [AS] Michelle Piper, PA-C                                 Medical Decision Making This patient presents to the ED for concern of lower extremity swelling, this involves an extensive number of treatment options, and is a complaint that carries with it a high risk of complications and morbidity.  The differential diagnosis includes CHF exacerbation,  lymphedema, bilateral DVT  My initial workup includes labs, imaging  Additional history obtained from: Nursing notes from this visit. Previous records within EMR system office visit on 09/25/2023 and 09/26/2023 with Dr. Basilio Cairo and Dr. Candise Che Family  wife at bedside provides portion of the history  I ordered, reviewed and interpreted labs which include: CBC, BMP, BNP  I ordered imaging studies including chest x-ray, CT abdomen pelvis I independently visualized and interpreted imaging which showed no acute cardiopulmonary abnormalities on chest x-ray I agree with the radiologist interpretation  Consultations Obtained:  I requested consultation with oncology Dr. Arlana Pouch,  and discussed lab and imaging findings as well as pertinent plan - they recommend: Pending at shift change  Afebrile, hemodynamically stable.  77 year old male presenting for evaluation of bilateral lower extremity swelling up to the scrotum and penile shaft.  He follows with Dr. Candise Che.  He denies any pain or urinary complaints.  Was reportedly told 2 days ago to present for admission and CT of the abdomen and pelvis.  Unable to view these notes in EMR.  Lab workup pending at shift range.  CT abdomen pelvis reveals progressive pelvic adenopathy.  Awaiting recommendations from oncology. Care handed off to oncoming provider pending labs and oncology recommendations. Please see his note for final disposition and decision making.   Patient's case discussed with Dr. Maple Hudson who agrees with plan to consult oncology regarding recommendations.   Note: Portions of this report may have been transcribed using voice recognition software. Every effort was made to ensure accuracy; however, inadvertent computerized transcription errors may still be present.        Final Clinical Impression(s) / ED Diagnoses Final diagnoses:  Bilateral lower extremity edema    Rx / DC Orders ED Discharge Orders     None         Michelle Piper, Cordelia Poche 09/28/23 2204    Coral Spikes, DO 09/28/23 2234

## 2023-09-28 NOTE — Progress Notes (Signed)
Specialty Pharmacy Refill Coordination Note  Jerry Tran is a 77 y.o. male contacted today regarding refills of specialty medication(s) Pirtobrutinib   Patient requested Delivery   Delivery date: 10/02/23   Verified address: [address] [place] [address]   Medication will be filled on 10/01/23.  *pending refill request-- call if any delays*

## 2023-09-28 NOTE — ED Triage Notes (Signed)
Bilateral leg swelling and groin swelling x 2 weeks. Admits recent shortness of breath.   Receiving radiation for lymphoma.

## 2023-09-28 NOTE — ED Provider Notes (Signed)
Patient here with extensive leg swelling and anasarca.  Hx of lymphoma.  CT shows progression of disease and probable compressive effect from new retroperitoneal adenopathy causing hydroureteronephrosis and likely the cause of his AKI.  Will need admission.    I reassessed the patient and let him know the plan for admission.  Per. Dr. Arlana Pouch, continue prednisone 60mg .  Oncology to consult in the AM.  Dr. Arlana Pouch also recommends urology evaluation in the AM.  Will consult hospitalist for admission.  10:55 PM I discussed the case with Dr. Adela Glimpse, who is appreciated for admitting.   Roxy Horseman, PA-C 09/28/23 2256    Coral Spikes, DO 09/28/23 2314

## 2023-09-28 NOTE — H&P (Signed)
Jerry Tran HQI:[id number] DOB: [birth year] DOA: 09/28/2023     PCP: [facility]   Outpatient Specialists:  [facility]    Oncology  Dr.Kale    Patient arrived to ER on 09/28/23 at 1959 Referred by Attending Coral Spikes, DO   Patient coming from:    home Lives With family     Chief Complaint:   Chief Complaint  Patient presents with   Leg Swelling    HPI: Jerry Tran is a 77 y.o. male with medical history significant of mantle cell lymphoma, CKD, diastolic CHF, A-fib, HLD    Presented with   edema Noted leg swelling and groin swelling for the past 2 weeks and worsening shortness of breath no history of CKD and mantle cell lymphoma He was seen by oncology recommended him to have CT abdomen and pelvis He also was seen by urology who felt that he needs a Foley catheter placement His penis and testicles has been continuously increased in swelling for the past 2 weeks but he is not having any pain he still able to urinate no fevers or chills he has been trying to double up on his Lasix but did not seem to help with swelling is much CT scan done in ER showed progressive pelvic adenopathy    Denies significant ETOH intake   Does not smoke   Lab Results  Component Value Date   SARSCOV2NAA NEGATIVE 12/10/2022   SARSCOV2NAA NEGATIVE 10/03/2022   SARSCOV2NAA NEGATIVE 12/09/2021    Regarding pertinent Chronic problems:    Hyperlipidemia - on statins Lipitor (atorvastatin)  Lipid Panel  No results found for: "CHOL", "TRIG", "HDL", "CHOLHDL", "VLDL", "LDLCALC", "LDLDIRECT", "LABVLDL"   HTN on lasix, metoprolol, minipress   chronic CHF diastolic/systolic/ combined - last echo  Recent Results (from the past 13244 hour(s))  ECHOCARDIOGRAM COMPLETE   Collection Time: 10/25/22 12:06 PM  Result Value   S' Lateral 3.20   Single Plane A4C EF 61.5   Single Plane A2C EF 73.3   Calc EF 68.2   Area-P 1/2 2.93   Narrative      ECHOCARDIOGRAM REPORT       IMPRESSIONS    1. Left ventricular ejection fraction, by estimation, is 60 to 65%. Left ventricular ejection fraction by 3D volume is 63 %. The left ventricle has normal function. The left ventricle has no regional wall motion abnormalities. Left ventricular diastolic  parameters are consistent with Grade I diastolic dysfunction (impaired relaxation). The average left ventricular global longitudinal strain is -23.3 %. The global longitudinal strain is normal.  2. Right ventricular systolic function is normal. The right ventricular size is normal. There is normal pulmonary artery systolic pressure. The estimated right ventricular systolic pressure is 27.2 mmHg.  3. There is no evidence of cardiac tamponade.  4. The mitral valve is grossly normal. Trivial mitral valve regurgitation.  5. The aortic valve is tricuspid. Aortic valve regurgitation is not visualized. No aortic stenosis is present.  6. The inferior vena cava is normal in size with greater than 50% respiratory variability, suggesting right atrial pressure of 3 mmHg.  7. Small PFO suspected with left to right flow.  Comparison(s): Changes from prior study are noted. 12/10/2021: LVEF 55-60%, RVSP 45.7 mmHg.            OSA -no longer on CPAP  A. Fib -   atrial fibrillation CHA2DS2 vas score  4     Not on anticoagulation          -  Rate control:  Currently controlled with  Metoprolol       CKD stage IIIa  baseline Cr 1.3 Estimated Creatinine Clearance: 26.6 mL/min (A) (by C-G formula based on SCr of 3.08 mg/dL (H)).  Lab Results  Component Value Date   CREATININE 3.08 (H) 09/28/2023   CREATININE 2.78 (H) 09/26/2023   CREATININE 1.36 (H) 09/07/2023   Lab Results  Component Value Date   NA 136 09/28/2023   CL 106 09/28/2023   K 4.3 09/28/2023   CO2 20 (L) 09/28/2023   BUN 87 (H) 09/28/2023   CREATININE 3.08 (H) 09/28/2023   GFRNONAA 20 (L) 09/28/2023   CALCIUM 9.2 09/28/2023   PHOS 3.9 12/22/2021   ALBUMIN 4.0  09/26/2023   GLUCOSE 100 (H) 09/28/2023    Chronic anemia - baseline hg Hemoglobin & Hematocrit  Recent Labs    09/07/23 1429 09/26/23 1529 09/28/23 2038  HGB 9.1* 9.5* 9.9*     Cancer: mantel cell lymphoma on radiation and chemo     While in ER: Clinical Course as of 09/28/23 2246  Fri Sep 28, 2023  2121 Discussed with oncology Dr. Arlana Pouch.  He requests secure chat message after CT abdomen pelvis is resulted for disposition planning [AS]    Clinical Course User Index [AS] Michelle Piper, PA-C       Lab Orders         Basic metabolic panel         CBC with Differential         Brain natriuretic peptide         Lactate dehydrogenase         Urinalysis, Routine w reflex microscopic -Urine, Clean Catch         CXR - 1. No evidence of acute chest disease. 2. Mild cardiomegaly. 3. Aortic atherosclerosis. 4. Right IJ port catheter.  CTabd/pelvis -  1. Extensive progressive retroperitoneal and pelvic adenopathy, with some slightly improved inguinal and medial upper thigh adenopathy. 2. Interval new mild bilateral hydroureteronephrosis, probably due to compressive effect from new extensive retroperitoneal adenopathy. 3. Increasing bladder wall thickening which could be due to cystitis, nondistention, or XRT. 4. Constipation and diverticulosis. 5. Minimal pelvic ascites. 6. Worsening body wall anasarca which could be due to malnutrition, hepatic dysfunction, or fluid overload. 7. Severe penoscrotal edema, small hydroceles. 8. Aortic atherosclerosis.    Following Medications were ordered in ER: Medications - No data to display  _______________________________________________________ ER Provider Called:       Dr. Marrian Salvage They Recommend admit to medicine  Will see in AM   Continue steroids Am consult to Urology     ED Triage Vitals  Encounter Vitals Group     BP 09/28/23 2008 113/68     Systolic BP Percentile --      Diastolic BP Percentile --      Pulse  Rate 09/28/23 2008 95     Resp 09/28/23 2008 20     Temp 09/28/23 2008 97.7 F (36.5 C)     Temp Source 09/28/23 2008 Oral     SpO2 09/28/23 2008 100 %     Weight 09/28/23 2012 230 lb (104.3 kg)     Height 09/28/23 2012 6\' 4"  (1.93 m)     Head Circumference --      Peak Flow --      Pain Score 09/28/23 2012 0     Pain Loc --      Pain Education --  Exclude from Growth Chart --   OZHY(86)@     _________________________________________ Significant initial  Findings: Abnormal Labs Reviewed  BASIC METABOLIC PANEL - Abnormal; Notable for the following components:      Result Value   CO2 20 (*)    Glucose, Bld 100 (*)    BUN 87 (*)    Creatinine, Ser 3.08 (*)    GFR, Estimated 20 (*)    All other components within normal limits  CBC WITH DIFFERENTIAL/PLATELET - Abnormal; Notable for the following components:   WBC 2.7 (*)    RBC 3.45 (*)    Hemoglobin 9.9 (*)    HCT 30.6 (*)    RDW 17.3 (*)    Platelets 106 (*)    Lymphs Abs 0.5 (*)    Abs Immature Granulocytes 0.12 (*)    All other components within normal limits  LACTATE DEHYDROGENASE - Abnormal; Notable for the following components:   LDH 547 (*)    All other components within normal limits      _________________________ Troponin ***ordered Cardiac Panel (last 3 results) No results for input(s): "CKTOTAL", "CKMB", "TROPONINIHS", "RELINDX" in the last 72 hours.   ECG: Ordered Personally reviewed and interpreted by me showing: HR : *** Rhythm: *NSR, Sinus tachycardia * A.fib. W RVR, RBBB, LBBB, Paced Ischemic changes*nonspecific changes, no evidence of ischemic changes QTC*  BNP (last 3 results) Recent Labs    12/10/22 0701  BNP 84.1     COVID-19 Labs  Recent Labs    09/26/23 1526 09/28/23 2122  LDH 553* 547*    Lab Results  Component Value Date   SARSCOV2NAA NEGATIVE 12/10/2022   SARSCOV2NAA NEGATIVE 10/03/2022   SARSCOV2NAA NEGATIVE 12/09/2021       The recent clinical data is shown  below. Vitals:   09/28/23 2008 09/28/23 2012 09/28/23 2015 09/28/23 2200  BP: 113/68  121/79 123/60  Pulse: 95  94 76  Resp: 20     Temp: 97.7 F (36.5 C)     TempSrc: Oral     SpO2: 100%  100% 100%  Weight:  104.3 kg    Height:  6\' 4"  (1.93 m)      WBC     Component Value Date/Time   WBC 2.7 (L) 09/28/2023 2038   LYMPHSABS 0.5 (L) 09/28/2023 2038   MONOABS 0.4 09/28/2023 2038   EOSABS 0.0 09/28/2023 2038   BASOSABS 0.0 09/28/2023 2038    Lactic Acid, Venous No results found for: "LATICACIDVEN"    Lactic Acid, Venous No results found for: "LATICACIDVEN"  Procalcitonin *** Ordered      UA *** no evidence of UTI  ***Pending ***not ordered   Urine analysis:    Component Value Date/Time   COLORURINE YELLOW 04/01/2023 1730   APPEARANCEUR CLOUDY (A) 04/01/2023 1730   LABSPEC 1.024 04/01/2023 1730   PHURINE 6.0 04/01/2023 1730   GLUCOSEU NEGATIVE 04/01/2023 1730   HGBUR SMALL (A) 04/01/2023 1730   BILIRUBINUR NEGATIVE 04/01/2023 1730   KETONESUR NEGATIVE 04/01/2023 1730   PROTEINUR NEGATIVE 04/01/2023 1730   NITRITE NEGATIVE 04/01/2023 1730   LEUKOCYTESUR MODERATE (A) 04/01/2023 1730    Results for orders placed or performed during the hospital encounter of 04/01/23  Urine Culture     Status: Abnormal   Collection Time: 04/01/23  5:43 PM   Specimen: Urine, Clean Catch  Result Value Ref Range Status   Specimen Description   Final    URINE, CLEAN CATCH Performed at [facility], [address], [place], [place] [address]    Special Requests   Final    NONE Performed at [facility], [address] [place], [place] [address]    Culture 10,000 COLONIES/mL STAPHYLOCOCCUS HAEMOLYTICUS (A)  Final   Report Status 04/04/2023 FINAL  Final   Organism ID, Bacteria STAPHYLOCOCCUS HAEMOLYTICUS (A)  Final      Susceptibility   Staphylococcus haemolyticus - MIC*    CIPROFLOXACIN <=0.5 SENSITIVE Sensitive     GENTAMICIN <=0.5  SENSITIVE Sensitive     NITROFURANTOIN <=16 SENSITIVE Sensitive     OXACILLIN >=4 RESISTANT Resistant     TETRACYCLINE >=16 RESISTANT Resistant     VANCOMYCIN <=0.5 SENSITIVE Sensitive     TRIMETH/SULFA <=10 SENSITIVE Sensitive     CLINDAMYCIN RESISTANT Resistant     RIFAMPIN <=0.5 SENSITIVE Sensitive     Inducible Clindamycin POSITIVE Resistant     * 10,000 COLONIES/mL STAPHYLOCOCCUS HAEMOLYTICUS    ABX started Antibiotics Given (last 72 hours)     None       No results found for the last 90 days.     ________________________________________________________________  Arterial ***Venous  Blood Gas result:  pH *** pCO2 ***; pO2 ***;     %O2 Sat ***.  ABG No results found for: "PHART", "PCO2ART", "PO2ART", "HCO3", "TCO2", "ACIDBASEDEF", "O2SAT"     __________________________________________________________ Recent Labs  Lab 09/26/23 1529 09/28/23 2038  NA 138 136  K 4.1 4.3  CO2 20* 20*  GLUCOSE 122* 100*  BUN 71* 87*  CREATININE 2.78* 3.08*  CALCIUM 9.4 9.2    Cr  * stable,  Up from baseline see below Lab Results  Component Value Date   CREATININE 3.08 (H) 09/28/2023   CREATININE 2.78 (H) 09/26/2023   CREATININE 1.36 (H) 09/07/2023    Recent Labs  Lab 09/26/23 1529  AST 38  ALT 16  ALKPHOS 68  BILITOT 0.4  PROT 7.2  ALBUMIN 4.0   Lab Results  Component Value Date   CALCIUM 9.2 09/28/2023   PHOS 3.9 12/22/2021          Plt: Lab Results  Component Value Date   PLT 106 (L) 09/28/2023         Recent Labs  Lab 09/26/23 1529 09/28/23 2038  WBC 2.9* 2.7*  NEUTROABS 2.0 1.7  HGB 9.5* 9.9*  HCT 29.9* 30.6*  MCV 88.2 88.7  PLT 114* 106*    HG/HCT * stable,  Down *Up from baseline see below    Component Value Date/Time   HGB 9.9 (L) 09/28/2023 2038   HGB 9.5 (L) 09/26/2023 1529   HCT 30.6 (L) 09/28/2023 2038   MCV 88.7 09/28/2023 2038      No results for input(s): "LIPASE", "AMYLASE" in the last 168 hours. No results for  input(s): "AMMONIA" in the last 168 hours.    .lab  _______________________________________________ Hospitalist was called for admission for   Bilateral lower extremity edema  AKI   Anasarca    The following Work up has been ordered so far:  Orders Placed This Encounter  Procedures   CT ABDOMEN PELVIS WO CONTRAST   DG Chest Port 1 View   Basic metabolic panel   CBC with Differential   Brain natriuretic peptide   Lactate dehydrogenase   Urinalysis, Routine w reflex microscopic -Urine, Clean Catch   Consult to oncology  Genital swelling [phone]   Consult to hospitalist   Insert peripheral IV     OTHER Significant initial  Findings:  labs  showing:     DM  labs:  HbA1C: No results for input(s): "HGBA1C" in the last 8760 hours.     CBG (last 3)  No results for input(s): "GLUCAP" in the last 72 hours.        Cultures:    Component Value Date/Time   SDES  04/01/2023 1743    URINE, CLEAN CATCH Performed at [facility], [address], [place], [place] [address]    SPECREQUEST  04/01/2023 1743    NONE Performed at [facility], [address] [place], [place] [address]    CULT 10,000 COLONIES/mL STAPHYLOCOCCUS HAEMOLYTICUS (A) 04/01/2023 1743   REPTSTATUS 04/04/2023 FINAL 04/01/2023 1743     Radiological Exams on Admission: CT ABDOMEN PELVIS WO CONTRAST  Result Date: 09/28/2023 CLINICAL DATA:  Lower extremity and groin swelling for 2 weeks with shortness of breath. Most recent PET-CT with extensive bulky abdominopelvic adenopathy. Currently undergoing XRT for lymphoma. EXAM: CT ABDOMEN AND PELVIS WITHOUT CONTRAST TECHNIQUE: Multidetector CT imaging of the abdomen and pelvis was performed following the standard protocol without IV contrast. RADIATION DOSE REDUCTION: This exam was performed according to the departmental dose-optimization program which includes automated exposure control, adjustment of the mA and/or kV  according to patient size and/or use of iterative reconstruction technique. COMPARISON:  PET-CT 08/03/2023, CT chest, abdomen and pelvis with IV contrast 05/29/2023. FINDINGS: Lower chest: No acute abnormality. No lung nodules. The cardiac size is normal. The coronary blood pool is less dense than the myocardium consistent with anemia. No pericardial effusion. Hepatobiliary: There are several small scattered hepatic cysts, with no follow-up imaging required. No new liver abnormality is seen without contrast. The gallbladder and bile ducts are unremarkable. Pancreas: Unremarkable without contrast. Spleen: Unremarkable without contrast.  No splenomegaly. Adrenals/Urinary Tract: There is no adrenal mass. No contour deforming abnormality of either unenhanced kidneys. Interval new mild bilateral hydroureteronephrosis. There is no urinary stone. This is probably due to compressive effect related to extensive progressive bilateral retroperitoneal adenopathy. The bladder is not fully distended but does appear increasingly thickened, which could be due to cystitis, nondistention, or XRT. Stomach/Bowel: No dilatation or wall thickening is seen without contrast. There is mild-to-moderate fecal stasis. Diverticulosis is noted without focal colitis or diverticulitis. The appendix is normal. Vascular/Lymphatic: Mild aortic atherosclerosis. No AAA. Extensive progressive retroperitoneal adenopathy. There are multilevel bulky retroperitoneal lymph nodes. No adenopathy was previously seen along the aorta and IVC This is suboptimally evaluated without contrast. Example lymph nodes are as follows: New retrocaval lymph node measuring 2.7 x 1.9 cm on 2:27; New aortocaval lymph node measuring 3.2 x 3.4 cm on 2:30; Numerous other new lymph nodes around the between the IVC and aorta are also noted. There are multiple new left periaortic chain nodes, largest of these is 5.4 x 3.2 cm on 2:36. There is extensive progressive adenopathy in the  pelvis. Examples include: A right common iliac chain node measuring 4.9 x 3.9 cm on 2:46, was previously 3.3 x 2.0 cm; Bulky pelvic sidewall nodes with additional progressive lymph nodes in the more distal common iliac chains, for example a left common iliac chain lymph node is estimated 4.9 x 3.5 cm on 2:53, was previously 2.1 x 1.8 cm; There are bulky bilateral external iliac chain nodes. On the right the largest is 6.1 x 4.6 cm on 2:65, previously 6 x 4.5 cm; Largest of the left external iliac chain nodes is 6.2 x 4.9 cm on 2:61, previously was 5 x 3.7  cm. Bulky bilateral inguinal adenopathy is again noted, largest slightly improved on the right, now 5.6 x 4.0 cm on 2:88, previously was 5.7 x 4.6 cm. Largest left inguinal chain node is less dense than previously possibly due to XRT, today measuring 3.9 x 3.0 cm on 2:84, was previously 5.7 x 4.8 cm. Lymph nodes in medial upper thighs there also slightly improved, on the left measuring 3.6 x 3.0 cm on 2:110, previously 4.0 x 3.6 cm. At the same level on the right, a medial upper thigh lymph node measures 3.0 x 2.6 cm on 2:105, was previously 5.7 x 4.4 cm. 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 The prostate is not enlarged. Other: Minimal pelvic ascites. Diffuse worsening body wall anasarca which could be due to malnutrition, hepatic dysfunction, or fluid overload. There is severe penoscrotal edema, small hydroceles. Mild diffuse haziness to the mesentery is also noted but without focality. Musculoskeletal: Advanced degenerative change lumbar spine. Mild hip DJD. No metastatic bone lesion is seen. Ankylosis right SI joint. IMPRESSION: 1. Extensive progressive retroperitoneal and pelvic adenopathy, with some slightly improved inguinal and medial upper thigh adenopathy. 2. Interval new mild bilateral hydroureteronephrosis, probably due to compressive effect from new extensive retroperitoneal adenopathy. 3. Increasing  bladder wall thickening which could be due to cystitis, nondistention, or XRT. 4. Constipation and diverticulosis. 5. Minimal pelvic ascites. 6. Worsening body wall anasarca which could be due to malnutrition, hepatic dysfunction, or fluid overload. 7. Severe penoscrotal edema, small hydroceles. 8. Aortic atherosclerosis. Aortic Atherosclerosis (ICD10-I70.0). Electronically Signed   By: Almira Bar M.D.   On: 09/28/2023 21:55   DG Chest Port 1 View  Result Date: 09/28/2023 CLINICAL DATA:  Shortness of breath with history of lymphoma. Receiving XRT for lymphoma. EXAM: PORTABLE CHEST 1 VIEW COMPARISON:  Chest CT with contrast 05/29/2023 FINDINGS: Right IJ port catheter again terminates at about the superior cavoatrial junction. A skin fold overlies the outer right upper lung field simulating a pneumothorax. No pneumothorax is seen. The lungs are clear. The sulci are sharp. There is mild cardiomegaly. No vascular congestion is seen. Stable mediastinum with aortic tortuosity and atherosclerosis. DJD both shoulders with no new osseous findings. IMPRESSION: 1. No evidence of acute chest disease. 2. Mild cardiomegaly. 3. Aortic atherosclerosis. 4. Right IJ port catheter. Electronically Signed   By: Almira Bar M.D.   On: 09/28/2023 21:17   _______________________________________________________________________________________________________ Latest  Blood pressure 123/60, pulse 76, temperature 97.7 F (36.5 C), temperature source Oral, resp. rate 20, height 6\' 4"  (1.93 m), weight 104.3 kg, SpO2 100%.   Vitals  labs and radiology finding personally reviewed  Review of Systems:    Pertinent positives include: ***  Constitutional:  No weight loss, night sweats, Fevers, chills, fatigue, weight loss  HEENT:  No headaches, Difficulty swallowing,Tooth/dental problems,Sore throat,  No sneezing, itching, ear ache, nasal congestion, post nasal drip,  Cardio-vascular:  No chest pain, Orthopnea, PND,  anasarca, dizziness, palpitations.no Bilateral lower extremity swelling  GI:  No heartburn, indigestion, abdominal pain, nausea, vomiting, diarrhea, change in bowel habits, loss of appetite, melena, blood in stool, hematemesis Resp:  no shortness of breath at rest. No dyspnea on exertion, No excess mucus, no productive cough, No non-productive cough, No coughing up of blood.No change in color of mucus.No wheezing. Skin:  no rash or lesions. No jaundice GU:  no dysuria, change in color of urine, no urgency or frequency. No straining to urinate.  No flank pain.  Musculoskeletal:  No  joint pain or no joint swelling. No decreased range of motion. No back pain.  Psych:  No change in mood or affect. No depression or anxiety. No memory loss.  Neuro: no localizing neurological complaints, no tingling, no weakness, no double vision, no gait abnormality, no slurred speech, no confusion  All systems reviewed and apart from HOPI all are negative _______________________________________________________________________________________________ Past Medical History:   Past Medical History:  Diagnosis Date   Arthritis    Chronic kidney disease, stage 3b (HCC) 12/09/2021   Dyslipidemia    GERD (gastroesophageal reflux disease)    Hypertension    PAF (paroxysmal atrial fibrillation) (HCC)    Prostate cancer (HCC) 03/2021   s/p radiation therapy   PTSD (post-traumatic stress disorder)       Past Surgical History:  Procedure Laterality Date   IR IMAGING GUIDED PORT INSERTION  11/06/2022   LIPOMA EXCISION Right 12/09/2021   Procedure: RIGHT SUPERCLAVICULAR LYMPH NODE EXCISION;  Surgeon: Andria Meuse, MD;  Location: [facility] OR;  Service: General;  Laterality: Right;    Social History:  Ambulatory  cane,     reports that he has quit smoking. His smoking use included cigarettes. He has a 15 pack-year smoking history. He has never used smokeless tobacco. He reports that he does not currently use  alcohol. He reports that he does not currently use drugs after having used the following drugs: Marijuana and Cocaine.   Family History:   Family History  Problem Relation Age of Onset   Cancer Neg Hx    ______________________________________________________________________________________________ Allergies: Allergies  Allergen Reactions   Sildenafil Other (See Comments)    Unknown reaction - reported by [facility]     Prior to Admission medications   Medication Sig Start Date End Date Taking? Authorizing Provider  acyclovir (ZOVIRAX) 400 MG tablet TAKE 1 TABLET(400 MG) BY MOUTH DAILY 09/04/23  Yes [name], MD  atorvastatin (LIPITOR) 20 MG tablet Take 10 mg by mouth at bedtime.   Yes [provider]  Cholecalciferol (VITAMIN D3 PO) Take 1 tablet by mouth every morning.   Yes [provider]  diclofenac Sodium (VOLTAREN) 1 % GEL Apply 2 g topically 4 (four) times daily as needed (pain).   Yes [provider]  fluticasone (FLONASE) 50 MCG/ACT nasal spray Place 1 spray into both nostrils daily as needed for allergies or rhinitis.   Yes [provider]  furosemide (LASIX) 20 MG tablet Take 40 mg by mouth daily.   Yes [provider]  LORazepam (ATIVAN) 0.5 MG tablet Take 1 tablet (0.5 mg total) by mouth every 6 (six) hours as needed (Nausea or vomiting). 12/16/21  Yes [name], MD  metoprolol tartrate (LOPRESSOR) 25 MG tablet Take 12.5 mg by mouth 2 (two) times daily. Take with flecainide   Yes [provider]  mirtazapine (REMERON) 30 MG tablet Take 30 mg by mouth at bedtime. 01/31/22  Yes [provider]  omeprazole (PRILOSEC) 20 MG capsule Take 20 mg by mouth daily before breakfast.   Yes [provider]  ammonium lactate (LAC-HYDRIN) 12 % lotion Apply 1 application topically 2 (two) times daily as needed for dry skin.    [provider]  feeding supplement (ENSURE ENLIVE / ENSURE PLUS) LIQD  Take 237 mLs by mouth 2 (two) times daily between meals. 12/11/21   Regalado, Belkys A, MD  flecainide (TAMBOCOR) 100 MG tablet Take 100 mg by mouth every 12 (twelve) hours.    [provider]  hydrocortisone (ANUSOL-HC) 25 MG suppository Place 1 suppository (25 mg total) rectally 2 (two) times daily. 01/30/23   Briant Cedar, PA-C  hydroxypropyl methylcellulose / hypromellose (ISOPTO TEARS / GONIOVISC) 2.5 % ophthalmic solution Place 2 drops into both eyes 2 (two) times daily as needed for dry eyes.    [provider]  magic mouthwash (multi-ingredient) oral suspension Take 5 mLs by mouth 4 times daily as needed for mouth and throat pain. 04/26/23   [name], MD  magic mouthwash w/lidocaine SOLN Take 5 mLs by mouth 4 (four) times daily as needed for mouth pain (mouth and throat pain related to radiation). Compound 400 ml total volume with  80ml of Maalox 80ml of Nystatin 100,000 unit/ml 80ml of 2% viscous lidocaine 80ml of Diphenhydramine 12.5mg /89ml 80ml of Distilled water. 04/26/23   [name], MD  Nutritional Supplements (ENSURE COMPLETE PO) Take 1 Container by mouth 2 (two) times daily. 07/26/23   [provider]  nystatin (MYCOSTATIN) 100000 UNIT/ML suspension Take 5 mLs (500,000 Units total) by mouth 4 (four) times daily. Patient not taking: Reported on 09/28/2023 03/23/23   [name], MD  pirtobrutinib (JAYPIRCA) 100 MG tablet Take 2 tablets (200 mg total) by mouth daily. 09/28/23   [name], MD  Potassium Chloride ER 20 MEQ TBCR Take 1 tablet by mouth 2 (two) times daily.    [provider]  potassium chloride SA (KLOR-CON M) 20 MEQ tablet TAKE 1 TABLET(20 MEQ) BY MOUTH TWICE DAILY 03/01/23   [name], MD  prazosin (MINIPRESS) 2 MG capsule Take 6 mg by mouth at bedtime.    [provider]  sucralfate (CARAFATE) 1 GM/10ML suspension Take 10 mLs by mouth 4 times daily -  with meals and at bedtime.  05/14/23   Walisiewicz, Yvonna Alanis E, PA-C  terbinafine (LAMISIL) 1 % cream Apply 1 application topically 2 (two) times daily as needed (athlete's foot).    [provider]  torsemide (DEMADEX) 10 MG tablet Take 20 mg by mouth daily.    [provider]  triamcinolone cream (KENALOG) 0.1 % Apply 1 application topically 2 (two) times daily as needed (rash).    [provider]    ___________________________________________________________________________________________________ Physical Exam:    09/28/2023   10:00 PM 09/28/2023    8:15 PM 09/28/2023    8:12 PM  Vitals with BMI  Height   6\' 4"   Weight   230 lbs  BMI   28.01  Systolic 123 121   Diastolic 60 79   Pulse 76 94      1. General:  in No ***Acute distress***increased work of breathing ***complaining of severe pain****agitated * Chronically ill *well *cachectic *toxic acutely ill -appearing 2. Psychological: Alert and *** Oriented 3. Head/ENT:   Moist *** Dry Mucous Membranes                          Head Non traumatic, neck supple                          Normal *** Poor Dentition 4. SKIN: normal *** decreased Skin turgor,  Skin clean Dry and intact no rash    5. Heart: Regular rate and rhythm no*** Murmur, no Rub or gallop 6. Lungs: ***Clear to auscultation bilaterally, no wheezes or crackles   7. Abdomen: Soft, ***non-tender, Non distended *** obese ***bowel sounds present 8. Lower extremities: no clubbing,  cyanosis, no ***edema 9. Neurologically Grossly intact, moving all 4 extremities equally *** strength 5 out of 5 in all 4 extremities cranial nerves II through XII intact 10. MSK: Normal range of motion    Chart has been reviewed  ______________________________________________________________________________________________  Assessment/Plan  ***  Admitted for *** Bilateral lower extremity edema ***  AKI (acute kidney injury) (HCC) ***  Anasarca ***    Present on  Admission: **None**     No problem-specific Assessment & Plan notes found for this encounter.    Other plan as per orders.  DVT prophylaxis:  SCD *** Lovenox       Code Status:    Code Status: Prior FULL CODE as per patient   I had personally discussed CODE STATUS with patient and family   ACP   none    Family Communication:   Family   at  Bedside  plan of care was discussed   with  Wife,    Diet    Disposition Plan:   *** likely will need placement for rehabilitation                          Back to current facility when stable                            To home once workup is complete and patient is stable  ***Following barriers for discharge:                             Chest pain *** Stroke *** work up is complete                            Electrolytes corrected                               Anemia corrected h/H stable                             Pain controlled with PO medications                               Afebrile, white count improving able to transition to PO antibiotics                             Will need to be able to tolerate PO                            Will likely need home health, home O2, set up                           Will need consultants to evaluate patient prior to discharge       Consult Orders  (From admission, onward)           Start     Ordered   09/28/23 2218  Consult to hospitalist  Once       Provider:  (Not yet assigned)  Question Answer Comment  Place call to: [facility]   Reason for Consult Admit  09/28/23 2217                              ***Would benefit from PT/OT eval prior to DC  Ordered                   Swallow eval - SLP ordered                   Diabetes care coordinator                   Transition of care consulted                   Nutrition    consulted                  Wound care  consulted                   Palliative care    consulted                   Behavioral health  consulted                     Consults called: ***     Admission status:  ED Disposition     ED Disposition  Admit   Condition  --   Comment  The patient appears reasonably stabilized for admission considering the current resources, flow, and capabilities available in the ED at this time, and I doubt any other [facility] requiring further screening and/or treatment in the ED prior to admission is  present.           Obs***  ***  inpatient     I Expect 2 midnight stay secondary to severity of patient's current illness need for inpatient interventions justified by the following: ***hemodynamic instability despite optimal treatment (tachycardia *hypotension * tachypnea *hypoxia, hypercapnia) * Severe lab/radiological/exam abnormalities including:     and extensive comorbidities including: *substance abuse  *Chronic pain *DM2  * CHF * CAD  * COPD/asthma *Morbid Obesity * CKD *dementia *liver disease *history of stroke with residual deficits *  malignancy, * sickle cell disease  History of amputation Chronic anticoagulation  That are currently affecting medical management.   I expect  patient to be hospitalized for 2 midnights requiring inpatient medical care.  Patient is at high risk for adverse outcome (such as loss of life or disability) if not treated.  Indication for inpatient stay as follows:  Severe change from baseline regarding mental status Hemodynamic instability despite maximal medical therapy,  ongoing suicidal ideations,  severe pain requiring acute inpatient management,  inability to maintain oral hydration   persistent chest pain despite medical management Need for operative/procedural  intervention New or worsening hypoxia   Need for IV antibiotics, IV fluids, IV rate controling medications, IV antihypertensives, IV pain medications, IV anticoagulation, need for biPAP    Level of care   *** tele  For 12H 24H     medical floor       progressive     stepdown    tele indefinitely please discontinue once patient no longer qualifies COVID-19 Labs    Lab Results  Component Value Date   SARSCOV2NAA NEGATIVE 12/10/2022     Precautions: admitted as *** Covid Negative  ***asymptomatic screening protocol****PUI *** covid positive No active isolations ***If Covid PCR is negative  - please DC precautions -  would need additional investigation given very high risk for false native test result    Critical***  Patient is critically ill due to  hemodynamic instability * respiratory failure *severe sepsis* ongoing chest pain*  They are at high risk for life/limb threatening clinical deterioration requiring frequent reassessment and modifications of care.  Services provided include examination of the patient, review of relevant ancillary tests, prescription of lifesaving therapies, review of medications and prophylactic therapy.  Total critical care time excluding separately billable procedures: 60*  Minutes.    Rishawn Walck 09/28/2023, 10:46 PM ***  [facility]     after 2 AM please page floor coverage PA If 7AM-7PM, please contact the day team taking care of the patient using [email or web address]

## 2023-09-28 NOTE — Subjective & Objective (Signed)
Noted leg swelling and groin swelling for the past 2 weeks and worsening shortness of breath no history of CKD and mantle cell lymphoma He was seen by oncology recommended him to have CT abdomen and pelvis He also was seen by urology who felt that he needs a Foley catheter placement His penis and testicles has been continuously increased in swelling for the past 2 weeks but he is not having any pain he still able to urinate no fevers or chills he has been trying to double up on his Lasix but did not seem to help with swelling is much CT scan done in ER showed progressive pelvic adenopathy

## 2023-09-29 ENCOUNTER — Other Ambulatory Visit: Payer: Self-pay

## 2023-09-29 DIAGNOSIS — R591 Generalized enlarged lymph nodes: Secondary | ICD-10-CM | POA: Diagnosis not present

## 2023-09-29 DIAGNOSIS — R600 Localized edema: Secondary | ICD-10-CM | POA: Diagnosis not present

## 2023-09-29 DIAGNOSIS — T451X5A Adverse effect of antineoplastic and immunosuppressive drugs, initial encounter: Secondary | ICD-10-CM | POA: Diagnosis present

## 2023-09-29 DIAGNOSIS — C8318 Mantle cell lymphoma, lymph nodes of multiple sites: Secondary | ICD-10-CM | POA: Diagnosis not present

## 2023-09-29 DIAGNOSIS — D6481 Anemia due to antineoplastic chemotherapy: Secondary | ICD-10-CM | POA: Diagnosis present

## 2023-09-29 DIAGNOSIS — R601 Generalized edema: Secondary | ICD-10-CM

## 2023-09-29 DIAGNOSIS — N179 Acute kidney failure, unspecified: Secondary | ICD-10-CM | POA: Diagnosis not present

## 2023-09-29 LAB — COMPREHENSIVE METABOLIC PANEL
ALT: 14 U/L (ref 0–44)
AST: 35 U/L (ref 15–41)
Albumin: 3.6 g/dL (ref 3.5–5.0)
Alkaline Phosphatase: 57 U/L (ref 38–126)
Anion gap: 10 (ref 5–15)
BUN: 82 mg/dL — ABNORMAL HIGH (ref 8–23)
CO2: 20 mmol/L — ABNORMAL LOW (ref 22–32)
Calcium: 9.1 mg/dL (ref 8.9–10.3)
Chloride: 107 mmol/L (ref 98–111)
Creatinine, Ser: 2.99 mg/dL — ABNORMAL HIGH (ref 0.61–1.24)
GFR, Estimated: 21 mL/min — ABNORMAL LOW (ref >60)
Glucose, Bld: 91 mg/dL (ref 70–99)
Potassium: 4.2 mmol/L (ref 3.5–5.1)
Sodium: 137 mmol/L (ref 135–145)
Total Bilirubin: 0.6 mg/dL (ref <1.2)
Total Protein: 6.6 g/dL (ref 6.5–8.1)

## 2023-09-29 LAB — MAGNESIUM: Magnesium: 2.7 mg/dL — ABNORMAL HIGH (ref 1.7–2.4)

## 2023-09-29 LAB — RETICULOCYTES
Immature Retic Fract: 5.4 % (ref 2.3–15.9)
RBC.: 3.37 MIL/uL — ABNORMAL LOW (ref 4.22–5.81)
Retic Count, Absolute: 46.2 10*3/uL (ref 19.0–186.0)
Retic Ct Pct: 1.4 % (ref 0.4–3.1)

## 2023-09-29 LAB — OSMOLALITY: Osmolality: 308 mosm/kg — ABNORMAL HIGH (ref 275–295)

## 2023-09-29 LAB — CBC
HCT: 26.7 % — ABNORMAL LOW (ref 39.0–52.0)
Hemoglobin: 8.7 g/dL — ABNORMAL LOW (ref 13.0–17.0)
MCH: 28.9 pg (ref 26.0–34.0)
MCHC: 32.6 g/dL (ref 30.0–36.0)
MCV: 88.7 fL (ref 80.0–100.0)
Platelets: 115 10*3/uL — ABNORMAL LOW (ref 150–400)
RBC: 3.01 MIL/uL — ABNORMAL LOW (ref 4.22–5.81)
RDW: 17.3 % — ABNORMAL HIGH (ref 11.5–15.5)
WBC: 2.6 10*3/uL — ABNORMAL LOW (ref 4.0–10.5)
nRBC: 0 % (ref 0.0–0.2)

## 2023-09-29 LAB — URIC ACID: Uric Acid, Serum: 13.4 mg/dL — ABNORMAL HIGH (ref 3.7–8.6)

## 2023-09-29 LAB — PHOSPHORUS: Phosphorus: 4.7 mg/dL — ABNORMAL HIGH (ref 2.5–4.6)

## 2023-09-29 LAB — VITAMIN B12: Vitamin B-12: 841 pg/mL (ref 180–914)

## 2023-09-29 LAB — OSMOLALITY, URINE: Osmolality, Ur: 478 mosm/kg (ref 300–900)

## 2023-09-29 LAB — IRON AND TIBC
Iron: 55 ug/dL (ref 45–182)
Saturation Ratios: 20 % (ref 17.9–39.5)
TIBC: 281 ug/dL (ref 250–450)
UIBC: 226 ug/dL

## 2023-09-29 LAB — CK: Total CK: 38 U/L — ABNORMAL LOW (ref 49–397)

## 2023-09-29 LAB — FOLATE: Folate: 12.8 ng/mL (ref >5.9)

## 2023-09-29 LAB — FERRITIN: Ferritin: 188 ng/mL (ref 24–336)

## 2023-09-29 LAB — TSH: TSH: 1.458 u[IU]/mL (ref 0.350–4.500)

## 2023-09-29 LAB — PREALBUMIN: Prealbumin: 22 mg/dL (ref 18–38)

## 2023-09-29 MED ORDER — SODIUM CHLORIDE 0.9% FLUSH: 3.0000 mL | INTRAVENOUS | Status: DC | PRN

## 2023-09-29 MED ORDER — ONDANSETRON HCL 4 MG/2ML IJ SOLN: 4.0000 mg | Freq: Four times a day (QID) | INTRAMUSCULAR | Status: DC | PRN

## 2023-09-29 MED ORDER — POLYETHYLENE GLYCOL 3350 17 G PO PACK
17.0000 g | PACK | Freq: Every day | ORAL | Status: DC | PRN
  Administered 2023-10-08: 17 g via ORAL
  Filled 2023-09-29: qty 1

## 2023-09-29 MED ORDER — SODIUM CHLORIDE 0.9% FLUSH
3.0000 mL | Freq: Two times a day (BID) | INTRAVENOUS | Status: DC
  Administered 2023-09-29 – 2023-10-22 (×44): 3 mL via INTRAVENOUS

## 2023-09-29 MED ORDER — SODIUM CHLORIDE 0.9 % IV BOLUS
500.0000 mL | Freq: Once | INTRAVENOUS | Status: AC
  Administered 2023-09-29: 500 mL via INTRAVENOUS

## 2023-09-29 MED ORDER — PREDNISONE 20 MG PO TABS
40.0000 mg | ORAL_TABLET | Freq: Every day | ORAL | Status: DC
  Administered 2023-09-29 – 2023-10-22 (×23): 40 mg via ORAL
  Filled 2023-09-29 (×24): qty 2

## 2023-09-29 MED ORDER — DOCUSATE SODIUM 100 MG PO CAPS
100.0000 mg | ORAL_CAPSULE | Freq: Two times a day (BID) | ORAL | Status: DC
  Administered 2023-09-29 – 2023-10-22 (×31): 100 mg via ORAL
  Filled 2023-09-29 (×45): qty 1

## 2023-09-29 MED ORDER — BISACODYL 10 MG RE SUPP: 10.0000 mg | Freq: Every day | RECTAL | Status: DC | PRN

## 2023-09-29 MED ORDER — ACETAMINOPHEN 650 MG RE SUPP: 650.0000 mg | Freq: Four times a day (QID) | RECTAL | Status: DC | PRN

## 2023-09-29 MED ORDER — SODIUM CHLORIDE 0.9 % IV SOLN: 250.0000 mL | INTRAVENOUS | Status: AC | PRN

## 2023-09-29 MED ORDER — MIRTAZAPINE 15 MG PO TABS
30.0000 mg | ORAL_TABLET | Freq: Every day | ORAL | Status: DC
  Administered 2023-09-29 – 2023-10-05 (×7): 30 mg via ORAL
  Filled 2023-09-29 (×7): qty 2

## 2023-09-29 MED ORDER — HYDROCODONE-ACETAMINOPHEN 5-325 MG PO TABS
1.0000 | ORAL_TABLET | ORAL | Status: DC | PRN
  Administered 2023-10-08: 1 via ORAL
  Administered 2023-10-08 – 2023-10-09 (×2): 2 via ORAL
  Filled 2023-09-29 (×2): qty 2
  Filled 2023-09-29: qty 1
  Filled 2023-09-29: qty 2

## 2023-09-29 MED ORDER — PANTOPRAZOLE SODIUM 40 MG PO TBEC
40.0000 mg | DELAYED_RELEASE_TABLET | Freq: Every day | ORAL | Status: DC
  Administered 2023-09-29 – 2023-10-22 (×23): 40 mg via ORAL
  Filled 2023-09-29 (×24): qty 1

## 2023-09-29 MED ORDER — SENNA 8.6 MG PO TABS
1.0000 | ORAL_TABLET | Freq: Two times a day (BID) | ORAL | Status: DC
  Administered 2023-09-29 – 2023-10-22 (×31): 8.6 mg via ORAL
  Filled 2023-09-29 (×45): qty 1

## 2023-09-29 MED ORDER — ATORVASTATIN CALCIUM 10 MG PO TABS
10.0000 mg | ORAL_TABLET | Freq: Every day | ORAL | Status: DC
  Administered 2023-09-29 – 2023-10-21 (×23): 10 mg via ORAL
  Filled 2023-09-29 (×23): qty 1

## 2023-09-29 MED ORDER — ACETAMINOPHEN 325 MG PO TABS
650.0000 mg | ORAL_TABLET | Freq: Four times a day (QID) | ORAL | Status: DC | PRN
  Administered 2023-10-20 – 2023-10-21 (×2): 650 mg via ORAL
  Filled 2023-09-29 (×2): qty 2

## 2023-09-29 MED ORDER — ONDANSETRON HCL 4 MG PO TABS: 4.0000 mg | ORAL_TABLET | Freq: Four times a day (QID) | ORAL | Status: DC | PRN

## 2023-09-29 MED ORDER — METOPROLOL TARTRATE 25 MG PO TABS
12.5000 mg | ORAL_TABLET | Freq: Two times a day (BID) | ORAL | Status: DC
  Administered 2023-09-29 – 2023-10-05 (×13): 12.5 mg via ORAL
  Filled 2023-09-29 (×13): qty 1

## 2023-09-29 NOTE — Assessment & Plan Note (Signed)
Oncology is aware will see in a.m. recommend restarting prednisone

## 2023-09-29 NOTE — Assessment & Plan Note (Signed)
-  chronic avoid nephrotoxic medications such as NSAIDs, Vanco Zosyn combo,  avoid hypotension, continue to follow renal function  

## 2023-09-29 NOTE — Progress Notes (Signed)
Patient seen and examined personally, I reviewed the chart, history and physical and admission note, done by admitting physician this morning and agree with the same with following addendum.  Please refer to the morning admission note for more detailed plan of care.  Briefly,  77 y.o. male with medical history significant of mantle cell lymphoma, CKD, diastolic CHF, A-fib, HLD presented with edema, leg swelling and groin swelling for the past 2 weeks and worsening shortness of breath He was seen by oncology recommended him to have CT abdomen and pelvis and also seen by urology who felt that he needs a Foley catheter placement, his penis and testicles has been continuously increased in swelling for the past 2 weeks but he is not having any pain In the ED: Vitals stable afebrile, labs showed BUN/creatinine at 271/2.7- recently on 11/8 was 36/1.3 egfr 54 CBC with similar pancytopenia.   CT scan without>>showed progressive pelvic adenopathy (extensive progressive retroperitoneal and pelvic adenopathy interval new mild bilateral hydroureteronephrosis probably due to compressive effect, increasing bladder wall thickening, constipation, diverticulosis and worsening body wall anasarca, severe penoscrotal edema) Recent PET scan 10//24 with progressive bulky lymphadenopathy suspected lymphomatous involvement in the bilateral upper arms Chest x-ray in the ED unremarkable. Patient was admitted for further management, oncology was consulted Seen in ED  Wife at bedside He is aaox3 No chest or SOB Legs are edematous extensively -he thinks they are some better. LUNGS ARE CLEAR.  A/P  Worsening lymphedema/anasarca Mantle cell lymphoma: Oncology has been consulted, continue prednisone 40 mg, supportive care monitor fluid status.  Bilateral hydroureteronephrosis probably due to compression from lymphadenopathy: Given gentle IV fluids overnight, repeat labs pending-if no significant improvement will consult  urology-given obstructive nature.  AKI on CKD stage IIIa: Creatinine worsening from baseline value of around 1.3. now 3.0> 2.99, montor voids, avoid nephrotoxic medications. Recent Labs    04/06/23 1209 04/30/23 1329 05/30/23 1306 06/29/23 0937 07/25/23 1132 08/24/23 1020 09/07/23 1429 09/26/23 1529 09/28/23 2038 09/29/23 0845  BUN 36* 14 32* 32* 35* 47* 36* 71* 87* 82*  CREATININE 1.63* 1.22 1.06 1.09 1.27* 1.29* 1.36* 2.78* 3.08* 2.99*  CO2 25 29 27 26 26 27 22  20* 20* 20*  K 4.2 3.5 4.0 3.8 4.0 4.4 4.0 4.1 4.3 4.2    Pancytopenia: In the setting of malignancy, monitor anemia panel labs Recent Labs  Lab 09/26/23 1529 09/28/23 2038 09/29/23 0845  HGB 9.5* 9.9* 8.7*  HCT 29.9* 30.6* 26.7*  WBC 2.9* 2.7* 2.6*  PLT 114* 106* 115*   HLD: Continue Lipitor  Chronic diastolic CHF HTN: BP controlled, last echo with EF 60 to 65% from 10/25/2022  OSA: no longer on CPAP  Atrial fibrillation: Not on anticoagulation, on metoprolol-holding for now.

## 2023-09-29 NOTE — Hospital Course (Addendum)
77 y.o. male with medical history significant of mantle cell lymphoma, CKD, diastolic CHF, A-fib, HLD presented with edema, leg swelling and groin swelling for the past 2 weeks and worsening shortness of breath He was seen by oncology recommended him to have CT abdomen and pelvis and also seen by urology who felt that he needs a Foley catheter placement, his penis and testicles has been continuously increased in swelling for the past 2 weeks but he is not having any pain In the ED: Vitals stable afebrile, labs showed BUN/creatinine at 271/2.7- recently on 11/8 was 36/1.3 egfr 54 CBC with similar pancytopenia. CT scan without>>showed progressive pelvic adenopathy (extensive progressive retroperitoneal and pelvic adenopathy interval new mild bilateral hydroureteronephrosis probably due to compressive effect, increasing bladder wall thickening, constipation, diverticulosis and worsening body wall anasarca, severe penoscrotal edema) Recent PET scan 10//24 with progressive bulky lymphadenopathy suspected lymphomatous involvement in the b/l upper arms Chest x-ray in the ED unremarkable. Patient was admitted for further management, oncology was consulted. With oral steroid patient's swelling slowly improving, creatinine continues to down trend.  Oncology at this time is planning for outpatient chemotherapy as patient has opted to go ahead with it.  Seen by urology given patient's obstructive nephropathy discussed about bilateral ureteral stent placement which is being planned for  10/05/23

## 2023-09-29 NOTE — Assessment & Plan Note (Signed)
Appears to be postobstructive secondary to severe lymphadenopathy in and hydro appreciate oncology consult will need urology consult in a.m. per oncology team For tonight resume prednisone 40 mg a day May need to adjust the dose Patient also noted to be somewhat hypotensive he has attempted to use Lasix to help with the swelling with no improvement likely resulting in intravascular depletion and worsening AKI Give gentle fluids and see if we can improve renal function

## 2023-09-29 NOTE — Assessment & Plan Note (Signed)
Chronic obtain anemia panel

## 2023-09-29 NOTE — Assessment & Plan Note (Signed)
Hold metoprolol today given hypotension.  Patient not on anticoagulation

## 2023-09-29 NOTE — Assessment & Plan Note (Addendum)
Chronic stable continue Lipitor 20 mg a day 

## 2023-09-29 NOTE — ED Notes (Signed)
ED TO INPATIENT HANDOFF REPORT  ED Nurse Name and Phone #:  Mellody Dance  -  [phone]  S Name/Age/Gender Jerry Tran 77 y.o. male Room/Bed: WA20/WA20  Code Status   Code Status: Full Code  Home/SNF/Other Home Patient oriented to: self, place, time, and situation Is this baseline? Yes   Triage Complete: Triage complete  Chief Complaint AKI (acute kidney injury) (HCC) [N17.9]  Triage Note Bilateral leg swelling and groin swelling x 2 weeks. Admits recent shortness of breath.   Receiving radiation for lymphoma.     Allergies Allergies  Allergen Reactions   Sildenafil Other (See Comments)    Unknown reaction - reported by [facility]    Level of Care/Admitting Diagnosis ED Disposition     ED Disposition  Admit   Condition  --   Comment  Hospital Area: [facility] [[id number]]  Level of Care: Progressive [102]  Admit to Progressive based on following criteria: CARDIOVASCULAR & THORACIC of moderate stability with acute coronary syndrome symptoms/low risk myocardial infarction/hypertensive urgency/arrhythmias/heart failure potentially compromising stability and stable post cardiovascular intervention patients.  Admit to Progressive based on following criteria: NEPHROLOGY stable condition requiring close monitoring for AKI, requiring Hemodialysis or Peritoneal Dialysis either from expected electrolyte imbalance, acidosis, or fluid overload that can be managed by NIPPV or high flow oxygen.  May admit patient to [facility] or [facility] if equivalent level of care is available:: No  Covid Evaluation: Asymptomatic - no recent exposure (last 10 days) testing not required  Diagnosis: AKI (acute kidney injury) [facility]) [[id number]]  Admitting Physician: Therisa Doyne [[id number]]  Attending Physician: Therisa Doyne [[id number]]  Certification:: I certify this patient will need inpatient services for at least 2 midnights  Expected Medical Readiness: 10/01/2023           B Medical/Surgery History Past Medical History:  Diagnosis Date   Arthritis    Chronic kidney disease, stage 3b (HCC) 12/09/2021   Dyslipidemia    GERD (gastroesophageal reflux disease)    Hypertension    PAF (paroxysmal atrial fibrillation) (HCC)    Prostate cancer (HCC) 03/2021   s/p radiation therapy   PTSD (post-traumatic stress disorder)    Past Surgical History:  Procedure Laterality Date   IR IMAGING GUIDED PORT INSERTION  11/06/2022   LIPOMA EXCISION Right 12/09/2021   Procedure: RIGHT SUPERCLAVICULAR LYMPH NODE EXCISION;  Surgeon: Andria Meuse, MD;  Location: [facility] OR;  Service: General;  Laterality: Right;     A IV Location/Drains/Wounds Patient Lines/Drains/Airways Status     Active Line/Drains/Airways     Name Placement date Placement time Site Days   Implanted Port 11/06/22 Right Chest 11/06/22  1157  Chest  327   Peripheral IV 09/28/23 20 G Anterior;Left;Proximal Forearm 09/28/23  2123  Forearm  1   Peripheral IV 09/29/23 22 G Posterior;Right Hand 09/29/23  0851  Hand  less than 1            Intake/Output Last 24 hours  Intake/Output Summary (Last 24 hours) at 09/29/2023 1148 Last data filed at 09/29/2023 0050 Gross per 24 hour  Intake 500 ml  Output --  Net 500 ml    Labs/Imaging Results for orders placed or performed during the hospital encounter of 09/28/23 (from the past 48 hour(s))  Troponin I (High Sensitivity)     Status: None   Collection Time: 09/28/23  8:35 PM  Result Value Ref Range   Troponin I (High Sensitivity) 8 <18 ng/L    Comment: (NOTE) Elevated high  sensitivity troponin I (hsTnI) values and significant  changes across serial measurements may suggest ACS but many other  chronic and acute conditions are known to elevate hsTnI results.  Refer to the "Links" section for chest pain algorithms and additional  guidance. Performed at [facility], [address] [place], [place] [address]   Hepatic  function panel     Status: None   Collection Time: 09/28/23  8:35 PM  Result Value Ref Range   Total Protein 6.8 6.5 - 8.1 g/dL   Albumin 3.7 3.5 - 5.0 g/dL   AST 39 15 - 41 U/L   ALT 15 0 - 44 U/L   Alkaline Phosphatase 63 38 - 126 U/L   Total Bilirubin 0.4 <1.2 mg/dL   Bilirubin, Direct <8.6 0.0 - 0.2 mg/dL   Indirect Bilirubin NOT CALCULATED 0.3 - 0.9 mg/dL    Comment: Performed at [facility], [address], [place], [place] [address]  Protime-INR     Status: None   Collection Time: 09/28/23  8:35 PM  Result Value Ref Range   Prothrombin Time 14.1 11.4 - 15.2 seconds   INR 1.1 0.8 - 1.2    Comment: (NOTE) INR goal varies based on device and disease states. Performed at [facility], [address], [place], [place] [address]   Basic metabolic panel     Status: Abnormal   Collection Time: 09/28/23  8:38 PM  Result Value Ref Range   Sodium 136 135 - 145 mmol/L   Potassium 4.3 3.5 - 5.1 mmol/L   Chloride 106 98 - 111 mmol/L   CO2 20 (L) 22 - 32 mmol/L   Glucose, Bld 100 (H) 70 - 99 mg/dL    Comment: Glucose reference range applies only to samples taken after fasting for at least 8 hours.   BUN 87 (H) 8 - 23 mg/dL   Creatinine, Ser 2.84 (H) 0.61 - 1.24 mg/dL   Calcium 9.2 8.9 - 13.2 mg/dL   GFR, Estimated 20 (L) >60 mL/min    Comment: (NOTE) Calculated using the CKD-EPI Creatinine Equation (2021)    Anion gap 10 5 - 15    Comment: Performed at [facility], [address] [place], [place] [address]  CBC with Differential     Status: Abnormal   Collection Time: 09/28/23  8:38 PM  Result Value Ref Range   WBC 2.7 (L) 4.0 - 10.5 K/uL   RBC 3.45 (L) 4.22 - 5.81 MIL/uL   Hemoglobin 9.9 (L) 13.0 - 17.0 g/dL   HCT 27.2 (L) 53.6 - 64.4 %   MCV 88.7 80.0 - 100.0 fL   MCH 28.7 26.0 - 34.0 pg   MCHC 32.4 30.0 - 36.0 g/dL   RDW 03.4 (H) 74.2 - 59.5 %   Platelets 106 (L) 150 - 400 K/uL   nRBC 0.0 0.0 - 0.2 %    Neutrophils Relative % 62 %   Neutro Abs 1.7 1.7 - 7.7 K/uL   Lymphocytes Relative 17 %   Lymphs Abs 0.5 (L) 0.7 - 4.0 K/uL   Monocytes Relative 13 %   Monocytes Absolute 0.4 0.1 - 1.0 K/uL   Eosinophils Relative 2 %   Eosinophils Absolute 0.0 0.0 - 0.5 K/uL   Basophils Relative 1 %   Basophils Absolute 0.0 0.0 - 0.1 K/uL   Immature Granulocytes 5 %   Abs Immature Granulocytes 0.12 (H) 0.00 - 0.07 K/uL    Comment: Performed at [facility], [address], [place], [place] [address]  Brain natriuretic peptide     Status: None   Collection Time: 09/28/23  8:38 PM  Result Value Ref Range   B Natriuretic Peptide 44.2 0.0 - 100.0 pg/mL    Comment: Performed at [facility], [address], [place], [place] [address]  Lactate dehydrogenase     Status: Abnormal   Collection Time: 09/28/23  9:22 PM  Result Value Ref Range   LDH 547 (H) 98 - 192 U/L    Comment: Performed at [facility], [address], [place], [place] [address]  Urinalysis, Routine w reflex microscopic -Urine, Clean Catch     Status: None   Collection Time: 09/28/23 10:05 PM  Result Value Ref Range   Color, Urine YELLOW YELLOW   APPearance CLEAR CLEAR   Specific Gravity, Urine 1.014 1.005 - 1.030   pH 5.0 5.0 - 8.0   Glucose, UA NEGATIVE NEGATIVE mg/dL   Hgb urine dipstick NEGATIVE NEGATIVE   Bilirubin Urine NEGATIVE NEGATIVE   Ketones, ur NEGATIVE NEGATIVE mg/dL   Protein, ur NEGATIVE NEGATIVE mg/dL   Nitrite NEGATIVE NEGATIVE   Leukocytes,Ua NEGATIVE NEGATIVE    Comment: Performed at [facility], [address], [place], [place] [address]  Osmolality, urine     Status: None   Collection Time: 09/28/23 10:58 PM  Result Value Ref Range   Osmolality, Ur 478 300 - 900 mOsm/kg    Comment: Performed at [facility], [address], [place], [place] [address]  Creatinine, urine, random     Status: None   Collection Time: 09/28/23 10:58  PM  Result Value Ref Range   Creatinine, Urine 147 mg/dL    Comment: Performed at [facility], [address] [place], [place] [address]  Sodium, urine, random     Status: None   Collection Time: 09/28/23 10:58 PM  Result Value Ref Range   Sodium, Ur 10 mmol/L    Comment: Performed at [facility], [address], [place], [place] [address]  Vitamin B12     Status: None   Collection Time: 09/29/23  4:31 AM  Result Value Ref Range   Vitamin B-12 841 180 - 914 pg/mL    Comment: (NOTE) This assay is not validated for testing neonatal or myeloproliferative syndrome specimens for Vitamin B12 levels. Performed at [facility], [address], [place], [place] [address]   Folate     Status: None   Collection Time: 09/29/23  4:31 AM  Result Value Ref Range   Folate 12.8 >5.9 ng/mL    Comment: Performed at [facility], [address] [place], [place] [address]  Prealbumin     Status: None   Collection Time: 09/29/23  4:31 AM  Result Value Ref Range   Prealbumin 22 18 - 38 mg/dL    Comment: Performed at [facility], [address] [place], [place] [address]  Uric acid     Status: Abnormal   Collection Time: 09/29/23  4:31 AM  Result Value Ref Range   Uric Acid, Serum 13.4 (H) 3.7 - 8.6 mg/dL    Comment: Performed at [facility], [address], [place], [place] [address]  CK     Status: Abnormal   Collection Time: 09/29/23  8:45 AM  Result Value Ref Range   Total CK 38 (L) 49 - 397 U/L  Comment: Performed at [facility], [address] [place], [place] [address]  Magnesium     Status: Abnormal   Collection Time: 09/29/23  8:45 AM  Result Value Ref Range   Magnesium 2.7 (H) 1.7 - 2.4 mg/dL    Comment: Performed at [facility], [address], [place], [place] [address]  Phosphorus     Status: Abnormal   Collection Time: 09/29/23  8:45 AM  Result  Value Ref Range   Phosphorus 4.7 (H) 2.5 - 4.6 mg/dL    Comment: Performed at [facility], [address], [place], [place] [address]  Comprehensive metabolic panel     Status: Abnormal   Collection Time: 09/29/23  8:45 AM  Result Value Ref Range   Sodium 137 135 - 145 mmol/L   Potassium 4.2 3.5 - 5.1 mmol/L   Chloride 107 98 - 111 mmol/L   CO2 20 (L) 22 - 32 mmol/L   Glucose, Bld 91 70 - 99 mg/dL    Comment: Glucose reference range applies only to samples taken after fasting for at least 8 hours.   BUN 82 (H) 8 - 23 mg/dL   Creatinine, Ser 9.14 (H) 0.61 - 1.24 mg/dL   Calcium 9.1 8.9 - 78.2 mg/dL   Total Protein 6.6 6.5 - 8.1 g/dL   Albumin 3.6 3.5 - 5.0 g/dL   AST 35 15 - 41 U/L   ALT 14 0 - 44 U/L   Alkaline Phosphatase 57 38 - 126 U/L   Total Bilirubin 0.6 <1.2 mg/dL   GFR, Estimated 21 (L) >60 mL/min    Comment: (NOTE) Calculated using the CKD-EPI Creatinine Equation (2021)    Anion gap 10 5 - 15    Comment: Performed at [facility], [address] [place], [place] [address]  CBC     Status: Abnormal   Collection Time: 09/29/23  8:45 AM  Result Value Ref Range   WBC 2.6 (L) 4.0 - 10.5 K/uL   RBC 3.01 (L) 4.22 - 5.81 MIL/uL   Hemoglobin 8.7 (L) 13.0 - 17.0 g/dL   HCT 30.8 (L) 65.7 - 84.6 %   MCV 88.7 80.0 - 100.0 fL   MCH 28.9 26.0 - 34.0 pg   MCHC 32.6 30.0 - 36.0 g/dL   RDW 96.2 (H) 95.2 - 84.1 %   Platelets 115 (L) 150 - 400 K/uL   nRBC 0.0 0.0 - 0.2 %    Comment: Performed at [facility], [address], [place], [place] [address]   CT ABDOMEN PELVIS WO CONTRAST  Result Date: 09/28/2023 CLINICAL DATA:  Lower extremity and groin swelling for 2 weeks with shortness of breath. Most recent PET-CT with extensive bulky abdominopelvic adenopathy. Currently undergoing XRT for lymphoma. EXAM: CT ABDOMEN AND PELVIS WITHOUT CONTRAST TECHNIQUE: Multidetector CT imaging of the abdomen and pelvis was performed  following the standard protocol without IV contrast. RADIATION DOSE REDUCTION: This exam was performed according to the departmental dose-optimization program which includes automated exposure control, adjustment of the mA and/or kV according to patient size and/or use of iterative reconstruction technique. COMPARISON:  PET-CT 08/03/2023, CT chest, abdomen and pelvis with IV contrast 05/29/2023. FINDINGS: Lower chest: No acute abnormality. No lung nodules. The cardiac size is normal. The coronary blood pool is less dense than the myocardium consistent with anemia. No pericardial effusion. Hepatobiliary: There are several small scattered hepatic cysts, with no follow-up imaging required. No new liver abnormality is seen  without contrast. The gallbladder and bile ducts are unremarkable. Pancreas: Unremarkable without contrast. Spleen: Unremarkable without contrast.  No splenomegaly. Adrenals/Urinary Tract: There is no adrenal mass. No contour deforming abnormality of either unenhanced kidneys. Interval new mild bilateral hydroureteronephrosis. There is no urinary stone. This is probably due to compressive effect related to extensive progressive bilateral retroperitoneal adenopathy. The bladder is not fully distended but does appear increasingly thickened, which could be due to cystitis, nondistention, or XRT. Stomach/Bowel: No dilatation or wall thickening is seen without contrast. There is mild-to-moderate fecal stasis. Diverticulosis is noted without focal colitis or diverticulitis. The appendix is normal. Vascular/Lymphatic: Mild aortic atherosclerosis. No AAA. Extensive progressive retroperitoneal adenopathy. There are multilevel bulky retroperitoneal lymph nodes. No adenopathy was previously seen along the aorta and IVC This is suboptimally evaluated without contrast. Example lymph nodes are as follows: New retrocaval lymph node measuring 2.7 x 1.9 cm on 2:27; New aortocaval lymph node measuring 3.2 x 3.4 cm on  2:30; Numerous other new lymph nodes around the between the IVC and aorta are also noted. There are multiple new left periaortic chain nodes, largest of these is 5.4 x 3.2 cm on 2:36. There is extensive progressive adenopathy in the pelvis. Examples include: A right common iliac chain node measuring 4.9 x 3.9 cm on 2:46, was previously 3.3 x 2.0 cm; Bulky pelvic sidewall nodes with additional progressive lymph nodes in the more distal common iliac chains, for example a left common iliac chain lymph node is estimated 4.9 x 3.5 cm on 2:53, was previously 2.1 x 1.8 cm; There are bulky bilateral external iliac chain nodes. On the right the largest is 6.1 x 4.6 cm on 2:65, previously 6 x 4.5 cm; Largest of the left external iliac chain nodes is 6.2 x 4.9 cm on 2:61, previously was 5 x 3.7 cm. Bulky bilateral inguinal adenopathy is again noted, largest slightly improved on the right, now 5.6 x 4.0 cm on 2:88, previously was 5.7 x 4.6 cm. Largest left inguinal chain node is less dense than previously possibly due to XRT, today measuring 3.9 x 3.0 cm on 2:84, was previously 5.7 x 4.8 cm. Lymph nodes in medial upper thighs there also slightly improved, on the left measuring 3.6 x 3.0 cm on 2:110, previously 4.0 x 3.6 cm. At the same level on the right, a medial upper thigh lymph node measures 3.0 x 2.6 cm on 2:105, was previously 5.7 x 4.4 cm. 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 The prostate is not enlarged. Other: Minimal pelvic ascites. Diffuse worsening body wall anasarca which could be due to malnutrition, hepatic dysfunction, or fluid overload. There is severe penoscrotal edema, small hydroceles. Mild diffuse haziness to the mesentery is also noted but without focality. Musculoskeletal: Advanced degenerative change lumbar spine. Mild hip DJD. No metastatic bone lesion is seen. Ankylosis right SI joint. IMPRESSION: 1. Extensive progressive retroperitoneal and pelvic  adenopathy, with some slightly improved inguinal and medial upper thigh adenopathy. 2. Interval new mild bilateral hydroureteronephrosis, probably due to compressive effect from new extensive retroperitoneal adenopathy. 3. Increasing bladder wall thickening which could be due to cystitis, nondistention, or XRT. 4. Constipation and diverticulosis. 5. Minimal pelvic ascites. 6. Worsening body wall anasarca which could be due to malnutrition, hepatic dysfunction, or fluid overload. 7. Severe penoscrotal edema, small hydroceles. 8. Aortic atherosclerosis. Aortic Atherosclerosis (ICD10-I70.0). Electronically Signed   By: Almira Bar M.D.   On: 09/28/2023 21:55   DG Chest [facility] [address]  Result Date: 09/28/2023 CLINICAL DATA:  Shortness of breath with history of lymphoma. Receiving XRT for lymphoma. EXAM: PORTABLE CHEST 1 VIEW COMPARISON:  Chest CT with contrast 05/29/2023 FINDINGS: Right IJ port catheter again terminates at about the superior cavoatrial junction. A skin fold overlies the outer right upper lung field simulating a pneumothorax. No pneumothorax is seen. The lungs are clear. The sulci are sharp. There is mild cardiomegaly. No vascular congestion is seen. Stable mediastinum with aortic tortuosity and atherosclerosis. DJD both shoulders with no new osseous findings. IMPRESSION: 1. No evidence of acute chest disease. 2. Mild cardiomegaly. 3. Aortic atherosclerosis. 4. Right IJ port catheter. Electronically Signed   By: Almira Bar M.D.   On: 09/28/2023 21:17    Pending Labs Unresulted Labs (From admission, onward)     Start     Ordered   09/30/23 0500  Basic metabolic panel  Daily,   R      09/29/23 1126   09/30/23 0500  CBC  Daily,   R      09/29/23 1126   09/29/23 0029  Osmolality  Add-on,   AD        09/29/23 0028   09/28/23 2258  Iron and TIBC  (Anemia Panel (PNL))  Add-on,   AD        09/28/23 2257   09/28/23 2258  Ferritin  (Anemia Panel (PNL))  Add-on,   AD        09/28/23 2257    09/28/23 2258  Reticulocytes  (Anemia Panel (PNL))  Add-on,   AD        09/28/23 2257   09/28/23 2258  TSH  Add-on,   AD        09/28/23 2257            Vitals/Pain Today's Vitals   09/29/23 0420 09/29/23 0530 09/29/23 0815 09/29/23 0850  BP:  102/69 112/70 116/61  Pulse:  70 75 75  Resp:  15 (!) 23 (!) 22  Temp: 98.4 F (36.9 C)   97.8 F (36.6 C)  TempSrc: Oral   Oral  SpO2:  100% 100% 100%  Weight:      Height:      PainSc:        Isolation Precautions No active isolations  Medications Medications  atorvastatin (LIPITOR) tablet 10 mg (has no administration in time range)  mirtazapine (REMERON) tablet 30 mg (has no administration in time range)  pantoprazole (PROTONIX) EC tablet 40 mg (40 mg Oral Given 09/29/23 1011)  acetaminophen (TYLENOL) tablet 650 mg (has no administration in time range)    Or  acetaminophen (TYLENOL) suppository 650 mg (has no administration in time range)  HYDROcodone-acetaminophen (NORCO/VICODIN) 5-325 MG per tablet 1-2 tablet (has no administration in time range)  ondansetron (ZOFRAN) tablet 4 mg (has no administration in time range)    Or  ondansetron (ZOFRAN) injection 4 mg (has no administration in time range)  sodium chloride flush (NS) 0.9 % injection 3 mL (3 mLs Intravenous Given 09/29/23 1013)  sodium chloride flush (NS) 0.9 % injection 3 mL (has no administration in time range)  0.9 %  sodium chloride infusion (has no administration in time range)  docusate sodium (COLACE) capsule 100 mg (100 mg Oral Given 09/29/23 1011)  senna (SENOKOT) tablet 8.6 mg (8.6 mg Oral Given 09/29/23 1011)  polyethylene glycol (MIRALAX / GLYCOLAX) packet 17 g (has no administration in time range)  bisacodyl (DULCOLAX) suppository 10 mg (has no administration in time range)  predniSONE (DELTASONE) tablet 40 mg (40 mg Oral Given 09/29/23 0857)  sodium chloride 0.9 % bolus 500 mL (0 mLs Intravenous Stopped 09/29/23 0050)    Mobility walks      Focused Assessments    R Recommendations: See Admitting Provider Note  Report given to:   Additional Notes:

## 2023-09-29 NOTE — Plan of Care (Signed)
  Problem: Education: Goal: Knowledge of General Education information will improve Description: Including pain rating scale, medication(s)/side effects and non-pharmacologic comfort measures Outcome: Progressing   Problem: Clinical Measurements: Goal: Respiratory complications will improve Outcome: Progressing   Problem: Nutrition: Goal: Adequate nutrition will be maintained Outcome: Progressing   Problem: Coping: Goal: Level of anxiety will decrease Outcome: Progressing   Problem: Safety: Goal: Ability to remain free from injury will improve Outcome: Progressing   

## 2023-09-29 NOTE — Assessment & Plan Note (Signed)
In the setting of lymphoma.  Appreciate oncology consult

## 2023-09-29 NOTE — Consult Note (Signed)
[facility]  [phone]   HEMATOLOGY/ONCOLOGY IN-PATIENT CONSULTATION NOTE   PATIENT NAME: Jerry Tran   MR#: [id number] DOB: [birth year] CSN#: [id number]   DATE OF SERVICE: 09/29/2023  Requesting Physician: [facility]   Patient Care Team: Center, [facility] as PCP - General (General Practice) [name], MD as Consulting Physician (Hematology)  REASON FOR CONSULTATION:  Management decisions in a patient with relapsed, refractory blastoid variant mantle cell lymphoma  HISTORY OF PRESENT ILLNESS  Jerry Tran is a 77 y.o. gentleman who is being followed in our clinic by Dr. 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  Patient was recently seen by Dr.Vaidya at [facility] for evaluation for CAR-T cell therapy.   On his last clinic visit with Dr. Candise Che on 09/26/2023, patient was noted to have worsening lymphadenopathy in the groin, increasing edema in both legs, scrotal swelling.  He was advised to go to the ED for admission to hospital for consideration of IV diuretics and for urgent urology evaluation but patient deferred this at that time.  He presented to the ED yesterday, 09/28/2023 for further evaluation.  He was noted to have progressive leg swelling and groin swelling, worsening shortness of breath.  His penis and testicles have been continuously increased and swelling for the past 2 weeks.  He was also seen by urology for Foley catheter placement.   CT abdomen and pelvis done in the ED showed extensive progressive retroperitoneal and pelvic lymphadenopathy, interval new mild bilateral hydroureteronephrosis, probably due to compressive effect from new extensive retroperitoneal adenopathy.  Increasing bladder wall thickening.  Worsening body wall anasarca.  Severe penoscrotal edema, small hydroceles.  He was admitted for further management and our service was consulted  for any additional recommendations.  Patient seen and evaluated.  He is able to pee on his own.  Continues to have swelling in both legs and scrotum.  It has been difficult for him to walk as a result.   MEDICAL HISTORY Past Medical History:  Diagnosis Date   Arthritis    Chronic kidney disease, stage 3b (HCC) 12/09/2021   Dyslipidemia    GERD (gastroesophageal reflux disease)    Hypertension    PAF (paroxysmal atrial fibrillation) (HCC)    Prostate cancer (HCC) 03/2021   s/p radiation therapy   PTSD (post-traumatic stress disorder)      SURGICAL HISTORY Past Surgical History:  Procedure Laterality Date   IR IMAGING GUIDED PORT INSERTION  11/06/2022   LIPOMA EXCISION Right 12/09/2021   Procedure: RIGHT SUPERCLAVICULAR LYMPH NODE EXCISION;  Surgeon: Andria Meuse, MD;  Location: [facility] OR;  Service: General;  Laterality: Right;     ALLERGIES  Allergies  Allergen Reactions   Sildenafil Other (See Comments)    Unknown reaction - reported by [facility]    FAMILY HISTORY  Family History  Problem Relation Age of Onset   Cancer Neg Hx      SOCIAL HISTORY   Social History   Socioeconomic History   Marital status: Married    Spouse name: Not on file   Number of children: Not on file   Years of education: Not on file   Highest education level: Not on file  Occupational History   Occupation: retired  Tobacco Use   Smoking status: Former    Current packs/day: 1.00    Average packs/day: 1 pack/day for 15.0 years (15.0 ttl pk-yrs)    Types: Cigarettes  Smokeless tobacco: Never  Substance and Sexual Activity   Alcohol use: Not Currently    Comment: h/o heavy use   Drug use: Not Currently    Types: Marijuana, Cocaine    Comment: remote   Sexual activity: Not on file  Other Topics Concern   Not on file  Social History Narrative   Not on file   Social Determinants of Health   Financial Resource Strain: Not on file  Food Insecurity: No Food Insecurity (04/03/2023)    Hunger Vital Sign    Worried About Running Out of Food in the Last Year: Never true    Ran Out of Food in the Last Year: Never true  Transportation Needs: No Transportation Needs (04/03/2023)   PRAPARE - Administrator, Civil Service (Medical): No    Lack of Transportation (Non-Medical): No  Physical Activity: Not on file  Stress: Not on file  Social Connections: Not on file  Intimate Partner Violence: Not At Risk (04/03/2023)   Humiliation, Afraid, Rape, and Kick questionnaire    Fear of Current or Ex-Partner: No    Emotionally Abused: No    Physically Abused: No    Sexually Abused: No    CURRENT MEDICATIONS   Current Outpatient Medications  Medication Instructions   acyclovir (ZOVIRAX) 400 MG tablet TAKE 1 TABLET(400 MG) BY MOUTH DAILY   ammonium lactate (LAC-HYDRIN) 12 % lotion 1 application , Topical, 2 times daily PRN   atorvastatin (LIPITOR) 10 mg, Oral, Daily at bedtime   Cholecalciferol (VITAMIN D3 PO) 1 tablet, Oral, Every morning   diclofenac Sodium (VOLTAREN) 2 g, Topical, 4 times daily PRN   feeding supplement (ENSURE ENLIVE / ENSURE PLUS) LIQD 237 mLs, Oral, 2 times daily between meals   flecainide (TAMBOCOR) 100 mg, Every 12 hours   fluticasone (FLONASE) 50 MCG/ACT nasal spray 1 spray, Each Nare, Daily PRN   furosemide (LASIX) 40 mg, Oral, Daily   hydrocortisone (ANUSOL-HC) 25 mg, Rectal, 2 times daily   hydroxypropyl methylcellulose / hypromellose (ISOPTO TEARS / GONIOVISC) 2.5 % ophthalmic solution 2 drops, Both Eyes, 2 times daily PRN   LORazepam (ATIVAN) 0.5 mg, Oral, Every 6 hours PRN   magic mouthwash (multi-ingredient) oral suspension Take 5 mLs by mouth 4 times daily as needed for mouth and throat pain.   magic mouthwash w/lidocaine SOLN 5 mLs, Oral, 4 times daily PRN, Compound 400 ml total volume with <BR>68ml of Maalox<BR>94ml of Nystatin 100,000 unit/ml<BR>37ml of 2% viscous lidocaine<BR>19ml of Diphenhydramine 12.5mg /3ml<BR>80ml of Distilled water.    metoprolol tartrate (LOPRESSOR) 12.5 mg, Oral, 2 times daily, Take with flecainide   mirtazapine (REMERON) 30 mg, Oral, Daily at bedtime   Nutritional Supplements (ENSURE COMPLETE PO) 1 Container, 2 times daily   nystatin (MYCOSTATIN) 500,000 Units, Oral, 4 times daily   omeprazole (PRILOSEC) 20 mg, Oral, Daily before breakfast   pirtobrutinib (JAYPIRCA) 200 mg, Oral, Daily   Potassium Chloride ER 20 MEQ TBCR 1 tablet, Oral, 2 times daily   potassium chloride SA (KLOR-CON M) 20 MEQ tablet TAKE 1 TABLET(20 MEQ) BY MOUTH TWICE DAILY   prazosin (MINIPRESS) 6 mg, Oral, Daily at bedtime   sucralfate (CARAFATE) 1 GM/10ML suspension Take 10 mLs by mouth 4 times daily -  with meals and at bedtime.   terbinafine (LAMISIL) 1 % cream 1 application , Topical, 2 times daily PRN   torsemide (DEMADEX) 20 mg, Oral, Daily   triamcinolone cream (KENALOG) 0.1 % 1 application , Topical, 2 times daily PRN  REVIEW OF SYSTEMS   Review of Systems - Oncology  All other pertinent review of systems is negative except as mentioned above in HPI  PHYSICAL EXAMINATION  ECOG PERFORMANCE STATUS: 2 - Symptomatic, <50% confined to bed  Vitals:   09/29/23 0815 09/29/23 0850  BP: 112/70 116/61  Pulse: 75 75  Resp: (!) 23 (!) 22  Temp:  97.8 F (36.6 C)  SpO2: 100% 100%   Filed Weights   09/28/23 2012  Weight: 230 lb (104.3 kg)    Physical Exam Constitutional:      General: He is not in acute distress.    Appearance: Normal appearance.  HENT:     Head: Normocephalic and atraumatic.  Eyes:     General: No scleral icterus.    Conjunctiva/sclera: Conjunctivae normal.  Cardiovascular:     Rate and Rhythm: Normal rate and regular rhythm.     Heart sounds: Normal heart sounds.  Pulmonary:     Effort: Pulmonary effort is normal.     Breath sounds: Normal breath sounds.  Abdominal:     General: There is no distension.  Musculoskeletal:     Right lower leg: Edema present.     Left lower leg: Edema  present.     Comments: Severe lymphedema in both lower extremities extending all the way up to the groin/scrotum  Neurological:     General: No focal deficit present.     Mental Status: He is alert and oriented to person, place, and time.     LABORATORY DATA:   I have reviewed the data as listed  Results for orders placed or performed during the hospital encounter of 09/28/23 (from the past 24 hour(s))  Hepatic function panel   Collection Time: 09/28/23  8:35 PM  Result Value Ref Range   Total Protein 6.8 6.5 - 8.1 g/dL   Albumin 3.7 3.5 - 5.0 g/dL   AST 39 15 - 41 U/L   ALT 15 0 - 44 U/L   Alkaline Phosphatase 63 38 - 126 U/L   Total Bilirubin 0.4 <1.2 mg/dL   Bilirubin, Direct <6.0 0.0 - 0.2 mg/dL   Indirect Bilirubin NOT CALCULATED 0.3 - 0.9 mg/dL  Protime-INR   Collection Time: 09/28/23  8:35 PM  Result Value Ref Range   Prothrombin Time 14.1 11.4 - 15.2 seconds   INR 1.1 0.8 - 1.2  Troponin I (High Sensitivity)   Collection Time: 09/28/23  8:35 PM  Result Value Ref Range   Troponin I (High Sensitivity) 8 <18 ng/L  Basic metabolic panel   Collection Time: 09/28/23  8:38 PM  Result Value Ref Range   Sodium 136 135 - 145 mmol/L   Potassium 4.3 3.5 - 5.1 mmol/L   Chloride 106 98 - 111 mmol/L   CO2 20 (L) 22 - 32 mmol/L   Glucose, Bld 100 (H) 70 - 99 mg/dL   BUN 87 (H) 8 - 23 mg/dL   Creatinine, Ser 4.54 (H) 0.61 - 1.24 mg/dL   Calcium 9.2 8.9 - 09.8 mg/dL   GFR, Estimated 20 (L) >60 mL/min   Anion gap 10 5 - 15  CBC with Differential   Collection Time: 09/28/23  8:38 PM  Result Value Ref Range   WBC 2.7 (L) 4.0 - 10.5 K/uL   RBC 3.45 (L) 4.22 - 5.81 MIL/uL   Hemoglobin 9.9 (L) 13.0 - 17.0 g/dL   HCT 11.9 (L) 14.7 - 82.9 %   MCV 88.7 80.0 - 100.0 fL   MCH  28.7 26.0 - 34.0 pg   MCHC 32.4 30.0 - 36.0 g/dL   RDW 08.6 (H) 57.8 - 46.9 %   Platelets 106 (L) 150 - 400 K/uL   nRBC 0.0 0.0 - 0.2 %   Neutrophils Relative % 62 %   Neutro Abs 1.7 1.7 - 7.7 K/uL    Lymphocytes Relative 17 %   Lymphs Abs 0.5 (L) 0.7 - 4.0 K/uL   Monocytes Relative 13 %   Monocytes Absolute 0.4 0.1 - 1.0 K/uL   Eosinophils Relative 2 %   Eosinophils Absolute 0.0 0.0 - 0.5 K/uL   Basophils Relative 1 %   Basophils Absolute 0.0 0.0 - 0.1 K/uL   Immature Granulocytes 5 %   Abs Immature Granulocytes 0.12 (H) 0.00 - 0.07 K/uL  Brain natriuretic peptide   Collection Time: 09/28/23  8:38 PM  Result Value Ref Range   B Natriuretic Peptide 44.2 0.0 - 100.0 pg/mL  Lactate dehydrogenase   Collection Time: 09/28/23  9:22 PM  Result Value Ref Range   LDH 547 (H) 98 - 192 U/L  Urinalysis, Routine w reflex microscopic -Urine, Clean Catch   Collection Time: 09/28/23 10:05 PM  Result Value Ref Range   Color, Urine YELLOW YELLOW   APPearance CLEAR CLEAR   Specific Gravity, Urine 1.014 1.005 - 1.030   pH 5.0 5.0 - 8.0   Glucose, UA NEGATIVE NEGATIVE mg/dL   Hgb urine dipstick NEGATIVE NEGATIVE   Bilirubin Urine NEGATIVE NEGATIVE   Ketones, ur NEGATIVE NEGATIVE mg/dL   Protein, ur NEGATIVE NEGATIVE mg/dL   Nitrite NEGATIVE NEGATIVE   Leukocytes,Ua NEGATIVE NEGATIVE  Osmolality, urine   Collection Time: 09/28/23 10:58 PM  Result Value Ref Range   Osmolality, Ur 478 300 - 900 mOsm/kg  Creatinine, urine, random   Collection Time: 09/28/23 10:58 PM  Result Value Ref Range   Creatinine, Urine 147 mg/dL  Sodium, urine, random   Collection Time: 09/28/23 10:58 PM  Result Value Ref Range   Sodium, Ur 10 mmol/L  Vitamin B12   Collection Time: 09/29/23  4:31 AM  Result Value Ref Range   Vitamin B-12 841 180 - 914 pg/mL  Folate   Collection Time: 09/29/23  4:31 AM  Result Value Ref Range   Folate 12.8 >5.9 ng/mL  Prealbumin   Collection Time: 09/29/23  4:31 AM  Result Value Ref Range   Prealbumin 22 18 - 38 mg/dL  Uric acid   Collection Time: 09/29/23  4:31 AM  Result Value Ref Range   Uric Acid, Serum 13.4 (H) 3.7 - 8.6 mg/dL  CK   Collection Time: 09/29/23  8:45  AM  Result Value Ref Range   Total CK 38 (L) 49 - 397 U/L  Magnesium   Collection Time: 09/29/23  8:45 AM  Result Value Ref Range   Magnesium 2.7 (H) 1.7 - 2.4 mg/dL  Phosphorus   Collection Time: 09/29/23  8:45 AM  Result Value Ref Range   Phosphorus 4.7 (H) 2.5 - 4.6 mg/dL  Comprehensive metabolic panel   Collection Time: 09/29/23  8:45 AM  Result Value Ref Range   Sodium 137 135 - 145 mmol/L   Potassium 4.2 3.5 - 5.1 mmol/L   Chloride 107 98 - 111 mmol/L   CO2 20 (L) 22 - 32 mmol/L   Glucose, Bld 91 70 - 99 mg/dL   BUN 82 (H) 8 - 23 mg/dL   Creatinine, Ser 6.29 (H) 0.61 - 1.24 mg/dL   Calcium 9.1 8.9 -  10.3 mg/dL   Total Protein 6.6 6.5 - 8.1 g/dL   Albumin 3.6 3.5 - 5.0 g/dL   AST 35 15 - 41 U/L   ALT 14 0 - 44 U/L   Alkaline Phosphatase 57 38 - 126 U/L   Total Bilirubin 0.6 <1.2 mg/dL   GFR, Estimated 21 (L) >60 mL/min   Anion gap 10 5 - 15  CBC   Collection Time: 09/29/23  8:45 AM  Result Value Ref Range   WBC 2.6 (L) 4.0 - 10.5 K/uL   RBC 3.01 (L) 4.22 - 5.81 MIL/uL   Hemoglobin 8.7 (L) 13.0 - 17.0 g/dL   HCT 51.8 (L) 84.1 - 66.0 %   MCV 88.7 80.0 - 100.0 fL   MCH 28.9 26.0 - 34.0 pg   MCHC 32.6 30.0 - 36.0 g/dL   RDW 63.0 (H) 16.0 - 10.9 %   Platelets 115 (L) 150 - 400 K/uL   nRBC 0.0 0.0 - 0.2 %      RADIOGRAPHIC STUDIES:  I have personally reviewed the radiological images as listed and agree with the findings in the report.  CT ABDOMEN PELVIS WO CONTRAST  Result Date: 09/28/2023 CLINICAL DATA:  Lower extremity and groin swelling for 2 weeks with shortness of breath. Most recent PET-CT with extensive bulky abdominopelvic adenopathy. Currently undergoing XRT for lymphoma. EXAM: CT ABDOMEN AND PELVIS WITHOUT CONTRAST TECHNIQUE: Multidetector CT imaging of the abdomen and pelvis was performed following the standard protocol without IV contrast. RADIATION DOSE REDUCTION: This exam was performed according to the departmental dose-optimization program which  includes automated exposure control, adjustment of the mA and/or kV according to patient size and/or use of iterative reconstruction technique. COMPARISON:  PET-CT 08/03/2023, CT chest, abdomen and pelvis with IV contrast 05/29/2023. FINDINGS: Lower chest: No acute abnormality. No lung nodules. The cardiac size is normal. The coronary blood pool is less dense than the myocardium consistent with anemia. No pericardial effusion. Hepatobiliary: There are several small scattered hepatic cysts, with no follow-up imaging required. No new liver abnormality is seen without contrast. The gallbladder and bile ducts are unremarkable. Pancreas: Unremarkable without contrast. Spleen: Unremarkable without contrast.  No splenomegaly. Adrenals/Urinary Tract: There is no adrenal mass. No contour deforming abnormality of either unenhanced kidneys. Interval new mild bilateral hydroureteronephrosis. There is no urinary stone. This is probably due to compressive effect related to extensive progressive bilateral retroperitoneal adenopathy. The bladder is not fully distended but does appear increasingly thickened, which could be due to cystitis, nondistention, or XRT. Stomach/Bowel: No dilatation or wall thickening is seen without contrast. There is mild-to-moderate fecal stasis. Diverticulosis is noted without focal colitis or diverticulitis. The appendix is normal. Vascular/Lymphatic: Mild aortic atherosclerosis. No AAA. Extensive progressive retroperitoneal adenopathy. There are multilevel bulky retroperitoneal lymph nodes. No adenopathy was previously seen along the aorta and IVC This is suboptimally evaluated without contrast. Example lymph nodes are as follows: New retrocaval lymph node measuring 2.7 x 1.9 cm on 2:27; New aortocaval lymph node measuring 3.2 x 3.4 cm on 2:30; Numerous other new lymph nodes around the between the IVC and aorta are also noted. There are multiple new left periaortic chain nodes, largest of these is 5.4  x 3.2 cm on 2:36. There is extensive progressive adenopathy in the pelvis. Examples include: A right common iliac chain node measuring 4.9 x 3.9 cm on 2:46, was previously 3.3 x 2.0 cm; Bulky pelvic sidewall nodes with additional progressive lymph nodes in the more distal common iliac chains, for  example a left common iliac chain lymph node is estimated 4.9 x 3.5 cm on 2:53, was previously 2.1 x 1.8 cm; There are bulky bilateral external iliac chain nodes. On the right the largest is 6.1 x 4.6 cm on 2:65, previously 6 x 4.5 cm; Largest of the left external iliac chain nodes is 6.2 x 4.9 cm on 2:61, previously was 5 x 3.7 cm. Bulky bilateral inguinal adenopathy is again noted, largest slightly improved on the right, now 5.6 x 4.0 cm on 2:88, previously was 5.7 x 4.6 cm. Largest left inguinal chain node is less dense than previously possibly due to XRT, today measuring 3.9 x 3.0 cm on 2:84, was previously 5.7 x 4.8 cm. Lymph nodes in medial upper thighs there also slightly improved, on the left measuring 3.6 x 3.0 cm on 2:110, previously 4.0 x 3.6 cm. At the same level on the right, a medial upper thigh lymph node measures 3.0 x 2.6 cm on 2:105, was previously 5.7 x 4.4 cm. 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 The prostate is not enlarged. Other: Minimal pelvic ascites. Diffuse worsening body wall anasarca which could be due to malnutrition, hepatic dysfunction, or fluid overload. There is severe penoscrotal edema, small hydroceles. Mild diffuse haziness to the mesentery is also noted but without focality. Musculoskeletal: Advanced degenerative change lumbar spine. Mild hip DJD. No metastatic bone lesion is seen. Ankylosis right SI joint. IMPRESSION: 1. Extensive progressive retroperitoneal and pelvic adenopathy, with some slightly improved inguinal and medial upper thigh adenopathy. 2. Interval new mild bilateral hydroureteronephrosis, probably due to compressive effect  from new extensive retroperitoneal adenopathy. 3. Increasing bladder wall thickening which could be due to cystitis, nondistention, or XRT. 4. Constipation and diverticulosis. 5. Minimal pelvic ascites. 6. Worsening body wall anasarca which could be due to malnutrition, hepatic dysfunction, or fluid overload. 7. Severe penoscrotal edema, small hydroceles. 8. Aortic atherosclerosis. Aortic Atherosclerosis (ICD10-I70.0). Electronically Signed   By: Almira Bar M.D.   On: 09/28/2023 21:55   DG Chest Port 1 View  Result Date: 09/28/2023 CLINICAL DATA:  Shortness of breath with history of lymphoma. Receiving XRT for lymphoma. EXAM: PORTABLE CHEST 1 VIEW COMPARISON:  Chest CT with contrast 05/29/2023 FINDINGS: Right IJ port catheter again terminates at about the superior cavoatrial junction. A skin fold overlies the outer right upper lung field simulating a pneumothorax. No pneumothorax is seen. The lungs are clear. The sulci are sharp. There is mild cardiomegaly. No vascular congestion is seen. Stable mediastinum with aortic tortuosity and atherosclerosis. DJD both shoulders with no new osseous findings. IMPRESSION: 1. No evidence of acute chest disease. 2. Mild cardiomegaly. 3. Aortic atherosclerosis. 4. Right IJ port catheter. Electronically Signed   By: Almira Bar M.D.   On: 09/28/2023 21:17    ASSESSMENT & PLAN:   77 y.o. gentleman who is being followed in our clinic by Dr. 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  Patient was recently seen by Dr.Vaidya at [facility] for evaluation for CAR-T cell therapy.   On his last clinic visit with Dr. Candise Che on 09/26/2023, patient was noted to have worsening lymphadenopathy in the groin, increasing edema in both legs, scrotal swelling.  He was advised to go to the ED for admission to hospital for consideration of IV diuretics and for urgent urology evaluation but patient deferred this at that  time.  He presented to the ED yesterday, 09/28/2023 for further  evaluation.  He was noted to have progressive leg swelling and groin swelling, worsening shortness of breath.  His penis and testicles have been continuously increased and swelling for the past 2 weeks.  He was also seen by urology for Foley catheter placement.   CT abdomen and pelvis done in the ED showed extensive progressive retroperitoneal and pelvic lymphadenopathy, interval new mild bilateral hydroureteronephrosis, probably due to compressive effect from new extensive retroperitoneal adenopathy.  Increasing bladder wall thickening.  Worsening body wall anasarca.  Severe penoscrotal edema, small hydroceles.  Patient declined option of CAR-T cell therapy previously.  I did discuss this with him again and his family members including his sister over the phone.  Strongly suggested to reconsider CAR-T cell therapy and patient seems to be inclined to discuss this further with Dr.Kale  Recommend continuing prednisone 40 mg daily to help with lymphedema/lymph node swelling.  Appreciate urology input given bilateral hydroureteronephrosis from compressive lymphadenopathy.  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  Continue to monitor CBCD daily and transfuse as needed to maintain hemoglobin above 7 and platelet count above 20,000.  I will update Dr. Candise Che on Monday, 10/01/2023 for continued follow-up and any additional recommendations.  Rest of care as per primary team and other specialties.  Thanks for the opportunity to participate in the care of this patient. Please contact me if there are any questions.  Meryl Crutch, MD Medical Oncology and Hematology 09/29/2023 11:39 AM    This document was completed utilizing speech recognition software. Grammatical errors, random word insertions, pronoun errors, and incomplete sentences are an occasional consequence of this system due to software limitations,  ambient noise, and hardware issues. Any formal questions or concerns about the content, text or information contained within the body of this dictation should be directly addressed to the provider for clarification.

## 2023-09-29 NOTE — Assessment & Plan Note (Signed)
Allow permissive hypertension given hypotension

## 2023-09-30 DIAGNOSIS — N179 Acute kidney failure, unspecified: Secondary | ICD-10-CM | POA: Diagnosis not present

## 2023-09-30 LAB — BASIC METABOLIC PANEL
Anion gap: 9 (ref 5–15)
BUN: 76 mg/dL — ABNORMAL HIGH (ref 8–23)
CO2: 20 mmol/L — ABNORMAL LOW (ref 22–32)
Calcium: 9.3 mg/dL (ref 8.9–10.3)
Chloride: 108 mmol/L (ref 98–111)
Creatinine, Ser: 2.64 mg/dL — ABNORMAL HIGH (ref 0.61–1.24)
GFR, Estimated: 24 mL/min — ABNORMAL LOW (ref >60)
Glucose, Bld: 105 mg/dL — ABNORMAL HIGH (ref 70–99)
Potassium: 3.9 mmol/L (ref 3.5–5.1)
Sodium: 137 mmol/L (ref 135–145)

## 2023-09-30 LAB — CBC
HCT: 29.3 % — ABNORMAL LOW (ref 39.0–52.0)
Hemoglobin: 9.1 g/dL — ABNORMAL LOW (ref 13.0–17.0)
MCH: 27.7 pg (ref 26.0–34.0)
MCHC: 31.1 g/dL (ref 30.0–36.0)
MCV: 89.1 fL (ref 80.0–100.0)
Platelets: 135 10*3/uL — ABNORMAL LOW (ref 150–400)
RBC: 3.29 MIL/uL — ABNORMAL LOW (ref 4.22–5.81)
RDW: 17.2 % — ABNORMAL HIGH (ref 11.5–15.5)
WBC: 3.3 10*3/uL — ABNORMAL LOW (ref 4.0–10.5)
nRBC: 0 % (ref 0.0–0.2)

## 2023-09-30 NOTE — Evaluation (Signed)
Occupational Therapy Evaluation Patient Details Name: Jerry Tran MRN: [id number] DOB: Nov 14, 1945 Today's Date: 09/30/2023   History of Present Illness Jerry Tran is a 77 yr old male admitted to the hospital 09-28-23 with shortness of breath & lower extremity with groin swelling. He was found to have AKI, and lymphadenopathy in setting of lymphoma. PMH: mantle cell lymphoma, CKD, diastolic CHF, a fib, HLD, PTSD, prostate CA s/p radiation, arthritis   Clinical Impression   The pt performed bed mobility, sit to stand, a toilet transfer at bathroom level, and grooming standing at the sink with supervision or better. He was noted to be with increased BLE and groin edema, impacting his ability to perform lower body dressing tasks without assistance; as such, OT instructed him on compensatory techniques in this regard, including the use of AE as needed. He presented with good understanding & teach back ability. He does not require further OT services, therefore OT will sign off and recommend he return home with his spouse at discharge.        If plan is discharge home, recommend the following: Assist for transportation;Assistance with cooking/housework    Functional Status Assessment  Patient has not had a recent decline in their functional status  Equipment Recommendations  Other (comment) Lexicographer)    Recommendations for Other Services       Precautions / Restrictions Restrictions Weight Bearing Restrictions: No      Mobility Bed Mobility Overal bed mobility: Modified Independent Bed Mobility: Supine to Sit     Supine to sit: Modified independent (Device/Increase time)          Transfers Overall transfer level: Independent Equipment used: None       Balance     Sitting balance-Jerry Tran: Good       Standing balance-Jerry Tran: Good         ADL either performed or assessed with clinical judgement   ADL   Eating/Feeding: Independent;Sitting   Grooming:  Standing;Independent           Upper Body Dressing : Independent   Lower Body Dressing: Minimal assistance;Sit to/from stand;Sitting/lateral leans;Moderate assistance Lower Body Dressing Details (indicate cue type and reason): The pt is currently limited by BLE and groin edema, impacting his ability to perform lower body dressing. As such, OT educated him on optional use of a reacher and sock aid to assist in this regard, specifically use of a reacher to doff socks, sock aid to donn socks, and reacher to don lower body clothing articles such as pants and underwear. Toilet Transfer: Ambulation;Modified Community education officer Details (indicate cue type and reason): The pt ambulated to and from the bathroom in his room without an assistive device. Toileting- Clothing Manipulation and Hygiene: Modified independent;Sit to/from stand                Pertinent Vitals/Pain Pain Assessment Pain Assessment: No/denies pain     Extremity/Trunk Assessment Upper Extremity Assessment Upper Extremity Assessment: Overall WFL for tasks assessed   Lower Extremity Assessment Lower Extremity Assessment: RLE deficits/detail;LLE deficits/detail RLE Deficits / Details: edema noted LLE Deficits / Details: edema noted       Communication Communication Communication: No apparent difficulties   Cognition Arousal: Alert Behavior During Therapy: WFL for tasks assessed/performed Overall Cognitive Status: Within Functional Limits for tasks assessed          General Comments: Oriented x4, able to follow commands without difficulty  Home Living Family/patient expects to be discharged to:: Private residence Living Arrangements: Spouse/significant other   Type of Home: Apartment Home Access: Level entry     Home Layout: One level               Home Equipment: None          Prior Functioning/Environment Prior Level of Function : Independent/Modified  Independent             Mobility Comments: He was independent with ambulation. ADLs Comments: He was independent with ADLs, light cleaning, and occasional driving. His spouse performed the cooking. He is a Optician, dispensing.        OT Problem List: Increased edema      OT Treatment/Interventions:   No further OT needs identified      OT Frequency:  N/A       AM-PAC OT "6 Clicks" Daily Activity     Outcome Measure Help from another person eating meals?: None Help from another person taking care of personal grooming?: None Help from another person toileting, which includes using toliet, bedpan, or urinal?: None Help from another person bathing (including washing, rinsing, drying)?: None Help from another person to put on and taking off regular upper body clothing?: None Help from another person to put on and taking off regular lower body clothing?: A Little 6 Click Score: 23   End of Session Equipment Utilized During Treatment: Other (comment) (N/A) Nurse Communication: Other (comment)  Activity Tolerance: Patient tolerated treatment well Patient left: in bed;with call bell/phone within reach  OT Visit Diagnosis: Muscle weakness (generalized) (M62.81)                Time: 5956-3875 OT Time Calculation (min): 15 min Charges:  OT General Charges $OT Visit: 1 Visit OT Evaluation $OT Eval Low Complexity: 1 Low    Makaiya Geerdes L Meosha Castanon, OTR/L 09/30/2023, 2:35 PM

## 2023-09-30 NOTE — Progress Notes (Signed)
PROGRESS NOTE Jerry Tran  ZOX:[id number] DOB: 1946-05-05 DOA: 09/28/2023 PCP: [name]  Brief Narrative/Hospital Course: 77 y.o. male with medical history significant of mantle cell lymphoma, CKD, diastolic CHF, A-fib, HLD presented with edema, leg swelling and groin swelling for the past 2 weeks and worsening shortness of breath He was seen by oncology recommended him to have CT abdomen and pelvis and also seen by urology who felt that he needs a Foley catheter placement, his penis and testicles has been continuously increased in swelling for the past 2 weeks but he is not having any pain In the ED: Vitals stable afebrile, labs showed BUN/creatinine at 271/2.7- recently on 11/8 was 36/1.3 egfr 54 CBC with similar pancytopenia.   CT scan without>>showed progressive pelvic adenopathy (extensive progressive retroperitoneal and pelvic adenopathy interval new mild bilateral hydroureteronephrosis probably due to compressive effect, increasing bladder wall thickening, constipation, diverticulosis and worsening body wall anasarca, severe penoscrotal edema) Recent PET scan 10//24 with progressive bulky lymphadenopathy suspected lymphomatous involvement in the bilateral upper arms Chest x-ray in the ED unremarkable. 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  Continue on prednisone to help with lymphedema and lymph node swelling, patient had declined option for CAR-T cell therapy previously and strongly suggested to reconsider in the incline and will discuss with Dr. Candise Che 12/2 Continue prednisone 40 mg, supportive care monitor fluid status. Recent Labs  Lab 09/28/23 2038 09/29/23 0845 09/30/23 0351  HGB 9.9* 8.7* 9.1*  HCT 30.6* 26.7* 29.3*  WBC 2.7* 2.6* 3.3*  PLT 106* 115* 135*    Bilateral hydroureteronephrosis probably due to compression from lymphadenopathy: Patient voiding well creatinine continues to improve.  Seen by urology previously will reconsult if needed  AKI on CKD stage IIIa: Creatinine worsening from baseline value of around 1.3. now 3.0> 2.99> 2.6 improving, monitor voids and avoid nephrotoxic medication and monitor renal function closely Recent Labs    04/30/23 1329 05/30/23 1306 06/29/23 0937 07/25/23 1132 08/24/23 1020 09/07/23 1429 09/26/23 1529 09/28/23 2038 09/29/23 0845 09/30/23 0351  BUN 14 32* 32* 35* 47* 36* 71* 87* 82* 76*  CREATININE 1.22 1.06 1.09 1.27* 1.29* 1.36* 2.78* 3.08* 2.99* 2.64*  CO2 29 27 26 26 27 22  20* 20* 20* 20*  K 3.5 4.0 3.8 4.0 4.4 4.0 4.1 4.3 4.2 3.9    HLD: Continue Lipitor  Chronic diastolic CHF HTN: BP controlled, last echo with EF 60 to 65% from 10/25/2022  OSA: no longer on CPAP  Atrial fibrillation: Not on anticoagulation, on metoprolol-holding for now.  DVT prophylaxis: SCDs Start: 09/29/23 0816 Code Status:   Code Status: Full Code Family Communication: plan of  care discussed with patient at  bedside. Patient status is: Inpatient because of lymphedema Mantle cell  Level of care: Progressive   Dispo: The patient is from: Home            Anticipated disposition:  Objective: Vitals last 24 hrs: Vitals:   09/30/23 0105 09/30/23 0518 09/30/23 0901 09/30/23 1147  BP: 123/87 103/71 103/74 92/72  Pulse: 60 62 77 69  Resp: 20 17  16   Temp: 97.6 F (36.4 C) 98 F (36.7 C)  97.7 F (36.5 C)  TempSrc: Oral Oral  Oral  SpO2: 100%  100% 100%  Weight:      Height:       Weight change: 0.373 kg  Physical Examination: General exam: alert awake, orientedx3 HEENT:Oral mucosa moist, Ear/Nose WNL grossly Respiratory system: Bilaterally clear BS,no use of accessory muscle Cardiovascular system: S1 & S2 +, No JVD. Gastrointestinal system: Abdomen soft,NT,ND, BS+ Nervous System: Alert, awake, moving all extremities,and following commands. Extremities: LE edema +++,distal peripheral pulses palpable and warm.  Skin: No rashes,no icterus.  Extensive lymphadenopathy multiple spots in MSK: Normal muscle bulk,tone, power   Medications reviewed:  Scheduled Meds:  atorvastatin  10 mg Oral QHS   docusate sodium  100 mg Oral BID   metoprolol tartrate  12.5 mg Oral BID   mirtazapine  30 mg Oral QHS   pantoprazole  40 mg Oral Daily   predniSONE  40 mg Oral Q breakfast   senna  1 tablet Oral BID   sodium chloride flush  3 mL Intravenous Q12H  Continuous Infusions:   Diet Order             Diet Heart Room service appropriate? Yes; Fluid consistency: Thin  Diet effective now                    Intake/Output Summary (Last 24 hours) at 09/30/2023 1157 Last data filed at 09/30/2023 0600 Gross per 24 hour  Intake 810 ml  Output 1350 ml  Net -540 ml   Net IO Since Admission: -40 mL [09/30/23 1157]  Wt Readings from Last 3 Encounters:  09/29/23 104.7 kg  09/26/23 102.7 kg  09/24/23 103.7 kg     Unresulted Labs (From admission, onward)     Start     Ordered   09/30/23 0500   Basic metabolic panel  Daily,   R      09/29/23 1126   09/30/23 0500  CBC  Daily,   R      09/29/23 1126          Data Reviewed: I have personally reviewed following labs and imaging studies CBC: Recent Labs  Lab 09/26/23 1529 09/28/23 2038 09/29/23 0845 09/30/23 0351  WBC 2.9* 2.7* 2.6* 3.3*  NEUTROABS 2.0 1.7  --   --   HGB 9.5* 9.9* 8.7* 9.1*  HCT 29.9* 30.6* 26.7* 29.3*  MCV 88.2 88.7 88.7 89.1  PLT 114* 106* 115* 135*   Basic Metabolic Panel:  Recent Labs  Lab 09/26/23 1529 09/28/23 2038 09/29/23 0845 09/30/23 0351  NA 138 136 137 137  K 4.1 4.3 4.2 3.9  CL 105 106 107 108  CO2 20* 20* 20* 20*  GLUCOSE 122* 100* 91 105*  BUN 71* 87* 82* 76*  CREATININE 2.78* 3.08* 2.99* 2.64*  CALCIUM 9.4 9.2 9.1 9.3  MG  --   --  2.7*  --   PHOS  --   --  4.7*  --  GFR: Estimated Creatinine Clearance: 31.2 mL/min (A) (by C-G formula based on SCr of 2.64 mg/dL (H)). Liver Function Tests:  Recent Labs  Lab 09/26/23 1529 09/28/23 2035 09/29/23 0845  AST 38 39 35  ALT 16 15 14   ALKPHOS 68 63 57  BILITOT 0.4 0.4 0.6  PROT 7.2 6.8 6.6  ALBUMIN 4.0 3.7 3.6   No results for input(s): "LIPASE", "AMYLASE" in the last 168 hours. No results for input(s): "AMMONIA" in the last 168 hours. Coagulation Profile:  Recent Labs  Lab 09/28/23 2035  INR 1.1  No results for input(s): "CHOL", "HDL", "LDLCALC", "TRIG", "CHOLHDL", "LDLDIRECT" in the last 72 hours. Recent Labs    09/29/23 1421  TSH 1.458   Sepsis Labs: No results for input(s): "PROCALCITON", "LATICACIDVEN" in the last 168 hours.  No results found for this or any previous visit (from the past 240 hour(s)).  Antimicrobials: Anti-infectives (From admission, onward)    None      Culture/Microbiology none  Radiology Studies: CT ABDOMEN PELVIS WO CONTRAST  Result Date: 09/28/2023 CLINICAL DATA:  Lower extremity and groin swelling for 2 weeks with shortness of breath. Most recent PET-CT with extensive bulky  abdominopelvic adenopathy. Currently undergoing XRT for lymphoma. EXAM: CT ABDOMEN AND PELVIS WITHOUT CONTRAST TECHNIQUE: Multidetector CT imaging of the abdomen and pelvis was performed following the standard protocol without IV contrast. RADIATION DOSE REDUCTION: This exam was performed according to the departmental dose-optimization program which includes automated exposure control, adjustment of the mA and/or kV according to patient size and/or use of iterative reconstruction technique. COMPARISON:  PET-CT 08/03/2023, CT chest, abdomen and pelvis with IV contrast 05/29/2023. FINDINGS: Lower chest: No acute abnormality. No lung nodules. The cardiac size is normal. The coronary blood pool is less dense than the myocardium consistent with anemia. No pericardial effusion. Hepatobiliary: There are several small scattered hepatic cysts, with no follow-up imaging required. No new liver abnormality is seen without contrast. The gallbladder and bile ducts are unremarkable. Pancreas: Unremarkable without contrast. Spleen: Unremarkable without contrast.  No splenomegaly. Adrenals/Urinary Tract: There is no adrenal mass. No contour deforming abnormality of either unenhanced kidneys. Interval new mild bilateral hydroureteronephrosis. There is no urinary stone. This is probably due to compressive effect related to extensive progressive bilateral retroperitoneal adenopathy. The bladder is not fully distended but does appear increasingly thickened, which could be due to cystitis, nondistention, or XRT. Stomach/Bowel: No dilatation or wall thickening is seen without contrast. There is mild-to-moderate fecal stasis. Diverticulosis is noted without focal colitis or diverticulitis. The appendix is normal. Vascular/Lymphatic: Mild aortic atherosclerosis. No AAA. Extensive progressive retroperitoneal adenopathy. There are multilevel bulky retroperitoneal lymph nodes. No adenopathy was previously seen along the aorta and IVC This is  suboptimally evaluated without contrast. Example lymph nodes are as follows: New retrocaval lymph node measuring 2.7 x 1.9 cm on 2:27; New aortocaval lymph node measuring 3.2 x 3.4 cm on 2:30; Numerous other new lymph nodes around the between the IVC and aorta are also noted. There are multiple new left periaortic chain nodes, largest of these is 5.4 x 3.2 cm on 2:36. There is extensive progressive adenopathy in the pelvis. Examples include: A right common iliac chain node measuring 4.9 x 3.9 cm on 2:46, was previously 3.3 x 2.0 cm; Bulky pelvic sidewall nodes with additional progressive lymph nodes in the more distal common iliac chains, for example a left common iliac chain lymph node is estimated 4.9 x 3.5 cm on 2:53, was previously 2.1 x 1.8 cm;  There are bulky bilateral external iliac chain nodes. On the right the largest is 6.1 x 4.6 cm on 2:65, previously 6 x 4.5 cm; Largest of the left external iliac chain nodes is 6.2 x 4.9 cm on 2:61, previously was 5 x 3.7 cm. Bulky bilateral inguinal adenopathy is again noted, largest slightly improved on the right, now 5.6 x 4.0 cm on 2:88, previously was 5.7 x 4.6 cm. Largest left inguinal chain node is less dense than previously possibly due to XRT, today measuring 3.9 x 3.0 cm on 2:84, was previously 5.7 x 4.8 cm. Lymph nodes in medial upper thighs there also slightly improved, on the left measuring 3.6 x 3.0 cm on 2:110, previously 4.0 x 3.6 cm. At the same level on the right, a medial upper thigh lymph node measures 3.0 x 2.6 cm on 2:105, was previously 5.7 x 4.4 cm. 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 The prostate is not enlarged. Other: Minimal pelvic ascites. Diffuse worsening body wall anasarca which could be due to malnutrition, hepatic dysfunction, or fluid overload. There is severe penoscrotal edema, small hydroceles. Mild diffuse haziness to the mesentery is also noted but without focality. Musculoskeletal:  Advanced degenerative change lumbar spine. Mild hip DJD. No metastatic bone lesion is seen. Ankylosis right SI joint. IMPRESSION: 1. Extensive progressive retroperitoneal and pelvic adenopathy, with some slightly improved inguinal and medial upper thigh adenopathy. 2. Interval new mild bilateral hydroureteronephrosis, probably due to compressive effect from new extensive retroperitoneal adenopathy. 3. Increasing bladder wall thickening which could be due to cystitis, nondistention, or XRT. 4. Constipation and diverticulosis. 5. Minimal pelvic ascites. 6. Worsening body wall anasarca which could be due to malnutrition, hepatic dysfunction, or fluid overload. 7. Severe penoscrotal edema, small hydroceles. 8. Aortic atherosclerosis. Aortic Atherosclerosis (ICD10-I70.0). Electronically Signed   By: Almira Bar M.D.   On: 09/28/2023 21:55   DG Chest Port 1 View  Result Date: 09/28/2023 CLINICAL DATA:  Shortness of breath with history of lymphoma. Receiving XRT for lymphoma. EXAM: PORTABLE CHEST 1 VIEW COMPARISON:  Chest CT with contrast 05/29/2023 FINDINGS: Right IJ port catheter again terminates at about the superior cavoatrial junction. A skin fold overlies the outer right upper lung field simulating a pneumothorax. No pneumothorax is seen. The lungs are clear. The sulci are sharp. There is mild cardiomegaly. No vascular congestion is seen. Stable mediastinum with aortic tortuosity and atherosclerosis. DJD both shoulders with no new osseous findings. IMPRESSION: 1. No evidence of acute chest disease. 2. Mild cardiomegaly. 3. Aortic atherosclerosis. 4. Right IJ port catheter. Electronically Signed   By: Almira Bar M.D.   On: 09/28/2023 21:17     LOS: 2 days   Total time spent in review of labs and imaging, patient evaluation, formulation of plan, documentation and communication with family: 50 minutes  Lanae Boast, MD [facility]  09/30/2023, 11:57 AM

## 2023-09-30 NOTE — Plan of Care (Signed)
  Problem: Health Behavior/Discharge Planning: Goal: Ability to manage health-related needs will improve Outcome: Progressing   Problem: Clinical Measurements: Goal: Cardiovascular complication will be avoided Outcome: Progressing   Problem: Activity: Goal: Risk for activity intolerance will decrease Outcome: Progressing   Problem: Coping: Goal: Level of anxiety will decrease Outcome: Progressing   Problem: Elimination: Goal: Will not experience complications related to urinary retention Outcome: Progressing   Problem: Safety: Goal: Ability to remain free from injury will improve Outcome: Progressing   Problem: Skin Integrity: Goal: Risk for impaired skin integrity will decrease Outcome: Progressing

## 2023-09-30 NOTE — Evaluation (Signed)
Physical Therapy Brief Evaluation and Discharge Note Patient Details Name: Santa Gallipeau MRN: [id number] DOB: 10-06-1946 Today's Date: 09/30/2023   History of Present Illness  77 yr old male admitted to the hospital 09-28-23 with shortness of breath & lower extremity+ groin swelling. He was found to have AKI, and lymphadenopathy in setting of lymphoma. PMH: mantle cell lymphoma, CKD, diastolic CHF, a fib, HLD, PTSD, prostate CA s/p radiation, arthritis  Clinical Impression  On eval, pt was Mod Ind with mobility. He walked in room to/from bathroom without his cane. Walked in hallway with cane ~200+ feet. Tolerated distance well. LEs still edematous but a bit better per pt report. No acute PT needs. 1x eval. Recommend daily hallway ambulation with mobility team.        PT Assessment    Assistance Needed at Discharge       Equipment Recommendations None recommended by PT  Recommendations for Other Services       Precautions/Restrictions Restrictions Weight Bearing Restrictions: No        Mobility  Bed Mobility          Transfers Overall transfer level: Modified independent                      Ambulation/Gait Ambulation/Gait assistance: Modified independent (Device/Increase time) Gait Distance (Feet): 225 Feet Assistive device: Straight cane Gait Pattern/deviations: Step-through pattern, Decreased stride length   General Gait Details: Pt reports gait a bit awkward 2* edema. No LOB. Tolerated distance well  Home Activity Instructions    Stairs            Modified Rankin (Stroke Patients Only)        Balance                          Pertinent Vitals/Pain   Pain Assessment Pain Assessment: No/denies pain     Home Living   Living Arrangements: Spouse/significant other       Home Equipment: Cane - single point        Prior Function        UE/LE Assessment               Communication   Communication Communication:  No apparent difficulties     Cognition         General Comments      Exercises     Assessment/Plan    PT Problem List         PT Visit Diagnosis      No Skilled PT Patient is modified independent with all activity/mobility   Co-evaluation                AMPAC 6 Clicks Help needed turning from your back to your side while in a flat bed without using bedrails?: None Help needed moving from lying on your back to sitting on the side of a flat bed without using bedrails?: None Help needed moving to and from a bed to a chair (including a wheelchair)?: None Help needed standing up from a chair using your arms (e.g., wheelchair or bedside chair)?: None Help needed to walk in hospital room?: None Help needed climbing 3-5 steps with a railing? : A Little 6 Click Score: 23      End of Session   Activity Tolerance: Patient tolerated treatment well Patient left: in bed;with call bell/phone within reach;with family/visitor present         Time: 1417-1431 PT  Time Calculation (min) (ACUTE ONLY): 14 min  Charges:   PT Evaluation $PT Eval Low Complexity: 1 Low         Faye Ramsay, PT Acute Rehabilitation  Office: [phone]

## 2023-10-01 ENCOUNTER — Other Ambulatory Visit: Payer: Self-pay

## 2023-10-01 DIAGNOSIS — Z7189 Other specified counseling: Secondary | ICD-10-CM

## 2023-10-01 DIAGNOSIS — N179 Acute kidney failure, unspecified: Secondary | ICD-10-CM | POA: Diagnosis not present

## 2023-10-01 DIAGNOSIS — C8318 Mantle cell lymphoma, lymph nodes of multiple sites: Secondary | ICD-10-CM | POA: Diagnosis not present

## 2023-10-01 DIAGNOSIS — R601 Generalized edema: Secondary | ICD-10-CM | POA: Diagnosis not present

## 2023-10-01 LAB — BASIC METABOLIC PANEL
Anion gap: 9 (ref 5–15)
BUN: 70 mg/dL — ABNORMAL HIGH (ref 8–23)
CO2: 20 mmol/L — ABNORMAL LOW (ref 22–32)
Calcium: 9.5 mg/dL (ref 8.9–10.3)
Chloride: 107 mmol/L (ref 98–111)
Creatinine, Ser: 2.55 mg/dL — ABNORMAL HIGH (ref 0.61–1.24)
GFR, Estimated: 25 mL/min — ABNORMAL LOW (ref >60)
Glucose, Bld: 94 mg/dL (ref 70–99)
Potassium: 4.3 mmol/L (ref 3.5–5.1)
Sodium: 136 mmol/L (ref 135–145)

## 2023-10-01 LAB — CBC
HCT: 30.7 % — ABNORMAL LOW (ref 39.0–52.0)
Hemoglobin: 9.4 g/dL — ABNORMAL LOW (ref 13.0–17.0)
MCH: 28.1 pg (ref 26.0–34.0)
MCHC: 30.6 g/dL (ref 30.0–36.0)
MCV: 91.6 fL (ref 80.0–100.0)
Platelets: 146 10*3/uL — ABNORMAL LOW (ref 150–400)
RBC: 3.35 MIL/uL — ABNORMAL LOW (ref 4.22–5.81)
RDW: 17.2 % — ABNORMAL HIGH (ref 11.5–15.5)
WBC: 3.6 10*3/uL — ABNORMAL LOW (ref 4.0–10.5)
nRBC: 0 % (ref 0.0–0.2)

## 2023-10-01 MED ORDER — ADULT MULTIVITAMIN W/MINERALS CH
1.0000 | ORAL_TABLET | Freq: Every day | ORAL | Status: DC
  Administered 2023-10-01 – 2023-10-22 (×21): 1 via ORAL
  Filled 2023-10-01 (×22): qty 1

## 2023-10-01 NOTE — Progress Notes (Signed)
Initial Nutrition Assessment  DOCUMENTATION CODES:   Not applicable  INTERVENTION:   -MVI with minerals daily -Liberalize diet to 2 gram sodium for wider variety of meal selections  NUTRITION DIAGNOSIS:   Increased nutrient needs related to cancer and cancer related treatments as evidenced by estimated needs.  GOAL:   Patient will meet greater than or equal to 90% of their needs  MONITOR:   PO intake  REASON FOR ASSESSMENT:   Consult Assessment of nutrition requirement/status  ASSESSMENT:   Pt with history significant of mantle cell lymphoma, CKD, diastolic CHF, A-fib, HLD presented with edema, leg swelling and groin swelling for the past 2 weeks PTA and worsening shortness of breath  Pt admitted with bilateral lower extremity edema due to lymphadenopathy and AKI.   Reviewed I/O's: -240 ml x 24 hours and -280 ml since admission  UOP: 600 ml x 24 hours  Per oncology notes, possible plan to reconsider CART-T cell therapy.   Spoke with pt over the phone, who reports feeling well today and is hopeful that he will be able to go home. He shares that he has a good appetite both PTA and during hospital admission. Noted meal completions 100%.   PTA pt usually eats 2 meals per day (Breakfast: sandwich and Dinner: meat, starch, and vegetable).   No wt loss noted over the past 3 months. Pt shares that his UBW is around 250#. Pt admits that he had difficulty with ambulation due to lower extremity edema. Pt reports edema has improved and has been walking with improvement.   Discussed importance of good meal intake to promote healing. Pt with no additional concerns, but expressed appreciation for visit.   Medications reviewed and include colace, remeron, protonix, prednisone, and senokot.   No results found for: "HGBA1C" PTA DM medications are none.   Labs reviewed: Phos: 4.7, Mg: 2.7, K WDL. CBGS: 97 (inpatient orders for glycemic control are none).    Diet Order:   Diet  Order             Diet 2 gram sodium Room service appropriate? Yes; Fluid consistency: Thin  Diet effective now                   EDUCATION NEEDS:   Education needs have been addressed  Skin:  Skin Assessment: Reviewed RN Assessment  Last BM:  09/30/23  Height:   Ht Readings from Last 1 Encounters:  09/28/23 6\' 4"  (1.93 m)    Weight:   Wt Readings from Last 1 Encounters:  09/29/23 104.7 kg    Ideal Body Weight:  91.8 kg  BMI:  Body mass index is 28.1 kg/m.  Estimated Nutritional Needs:   Kcal:  2300-2500  Protein:  115-130 grams  Fluid:  > 2 L    Levada Schilling, RD, LDN, CDCES Registered Dietitian III Certified Diabetes Care and Education Specialist Please refer to [facility] for RD and/or RD on-call/weekend/after hours pager

## 2023-10-01 NOTE — Progress Notes (Cosign Needed)
Jerry Tran   DOB:02/04/1946   EN#:[id number]      ASSESSMENT & PLAN:  Mantle Cell Lymphoma -relapsed, blastoid variant. -s/p eval for CAR-T cell therapy at [facility]. Pt declined however will discuss further with primary Onc/Dr. Candise Che  2. Extensive retroperitoneal and pelvic lymphadenopathy. Lymphedema/LN swelling -likely due to malignancy -continue prednisone 40 mg po daily  3. Pancytopenia -likely due to malignancy and BM involvement -CBC reviewed:  3.6>9.4<114.  -monitor cbcd daily.  -transfusional support as needed for hgb <7 and platelet count <20k.  No transfusional intervention warranted at this time.   Code Status Full  Subjective:  Pt seen awake and alert laying in bed. Family at bedside.  Reports he feels okay, still with bil LE edema. No shortness of breath noted at this time. Denies other acute complaints.   Objective:  Vitals:   10/01/23 0452 10/01/23 1258  BP: 104/73 128/75  Pulse: 69 60  Resp:    Temp: (!) 97.5 F (36.4 C) 97.9 F (36.6 C)  SpO2:  97%     Intake/Output Summary (Last 24 hours) at 10/01/2023 1259 Last data filed at 10/01/2023 1100 Gross per 24 hour  Intake 480 ml  Output 800 ml  Net -320 ml     REVIEW OF SYSTEMS:   Constitutional: Denies fevers, chills or abnormal night sweats Eyes: Denies blurriness of vision, double vision or watery eyes Ears, nose, mouth, throat, and face: Denies mucositis or sore throat Respiratory: Denies cough, dyspnea or wheezes Cardiovascular: Denies palpitation, chest discomfort or lower extremity swelling Gastrointestinal:  Denies nausea, heartburn or change in bowel habits Skin: Denies abnormal skin rashes Lymphatics: +edema with lymphadenopathy, No bruising Neurological:Denies numbness, tingling or new weaknesses Behavioral/Psych: Mood is stable, no new changes  All other systems were reviewed with the patient and are negative.  PHYSICAL EXAMINATION: ECOG PERFORMANCE STATUS: 3 - Symptomatic, >50%  confined to bed  Vitals:   10/01/23 0452 10/01/23 1258  BP: 104/73 128/75  Pulse: 69 60  Resp:    Temp: (!) 97.5 F (36.4 C) 97.9 F (36.6 C)  SpO2:  97%   Filed Weights   09/28/23 2012 09/29/23 1312  Weight: 230 lb (104.3 kg) 230 lb 13.2 oz (104.7 kg)    GENERAL:alert, no distress and comfortable SKIN: skin color, texture, turgor are normal, no rashes or significant lesions EYES: normal, conjunctiva are pink and non-injected, sclera clear OROPHARYNX:no exudate, no erythema and lips, buccal mucosa, and tongue normal  NECK: supple, thyroid normal size, non-tender, without nodularity LYMPH:  no palpable lymphadenopathy in the cervical, axillary or inguinal LUNGS: clear to auscultation and percussion with normal breathing effort HEART: regular rate & rhythm and no murmurs and no lower extremity edema ABDOMEN:abdomen soft, non-tender and normal bowel sounds Musculoskeletal:no cyanosis of digits and no clubbing  PSYCH: alert & oriented x 3 with fluent speech NEURO: no focal motor/sensory deficits   All questions were answered. The patient knows to call the clinic with any problems, questions or concerns.   The total time spent in the appointment was 25 minutes encounter with patient including review of chart and various tests results, discussions about plan of care and coordination of care plan  [name], NP 10/01/2023 12:59 PM    Labs Reviewed:  Recent Labs    09/26/23 1529 09/28/23 2035 09/28/23 2038 09/29/23 0845 09/30/23 0351 10/01/23 0359  NA 138  --    < > 137 137 136  K 4.1  --    < >  4.2 3.9 4.3  CL 105  --    < > 107 108 107  CO2 20*  --    < > 20* 20* 20*  GLUCOSE 122*  --    < > 91 105* 94  BUN 71*  --    < > 82* 76* 70*  CREATININE 2.78*  --    < > 2.99* 2.64* 2.55*  CALCIUM 9.4  --    < > 9.1 9.3 9.5  GFRNONAA 23*  --    < > 21* 24* 25*  PROT 7.2 6.8  --  6.6  --   --   ALBUMIN 4.0 3.7  --  3.6  --   --   AST 38 39  --  35  --   --   ALT 16 15   --  14  --   --   ALKPHOS 68 63  --  57  --   --   BILITOT 0.4 0.4  --  0.6  --   --   BILIDIR  --  <0.1  --   --   --   --   IBILI  --  NOT CALCULATED  --   --   --   --    < > = values in this interval not displayed.   CBC    Component Value Date/Time   WBC 3.6 (L) 10/01/2023 0359   RBC 3.35 (L) 10/01/2023 0359   HGB 9.4 (L) 10/01/2023 0359   HGB 9.5 (L) 09/26/2023 1529   HCT 30.7 (L) 10/01/2023 0359   PLT 146 (L) 10/01/2023 0359   PLT 114 (L) 09/26/2023 1529   MCV 91.6 10/01/2023 0359   MCH 28.1 10/01/2023 0359   MCHC 30.6 10/01/2023 0359   RDW 17.2 (H) 10/01/2023 0359   LYMPHSABS 0.5 (L) 09/28/2023 2038   MONOABS 0.4 09/28/2023 2038   EOSABS 0.0 09/28/2023 2038   BASOSABS 0.0 09/28/2023 2038     Studies Reviewed:  CT ABDOMEN PELVIS WO CONTRAST  Result Date: 09/28/2023 CLINICAL DATA:  Lower extremity and groin swelling for 2 weeks with shortness of breath. Most recent PET-CT with extensive bulky abdominopelvic adenopathy. Currently undergoing XRT for lymphoma. EXAM: CT ABDOMEN AND PELVIS WITHOUT CONTRAST TECHNIQUE: Multidetector CT imaging of the abdomen and pelvis was performed following the standard protocol without IV contrast. RADIATION DOSE REDUCTION: This exam was performed according to the departmental dose-optimization program which includes automated exposure control, adjustment of the mA and/or kV according to patient size and/or use of iterative reconstruction technique. COMPARISON:  PET-CT 08/03/2023, CT chest, abdomen and pelvis with IV contrast 05/29/2023. FINDINGS: Lower chest: No acute abnormality. No lung nodules. The cardiac size is normal. The coronary blood pool is less dense than the myocardium consistent with anemia. No pericardial effusion. Hepatobiliary: There are several small scattered hepatic cysts, with no follow-up imaging required. No new liver abnormality is seen without contrast. The gallbladder and bile ducts are unremarkable. Pancreas: Unremarkable  without contrast. Spleen: Unremarkable without contrast.  No splenomegaly. Adrenals/Urinary Tract: There is no adrenal mass. No contour deforming abnormality of either unenhanced kidneys. Interval new mild bilateral hydroureteronephrosis. There is no urinary stone. This is probably due to compressive effect related to extensive progressive bilateral retroperitoneal adenopathy. The bladder is not fully distended but does appear increasingly thickened, which could be due to cystitis, nondistention, or XRT. Stomach/Bowel: No dilatation or wall thickening is seen without contrast. There is mild-to-moderate fecal stasis. Diverticulosis is noted without focal  colitis or diverticulitis. The appendix is normal. Vascular/Lymphatic: Mild aortic atherosclerosis. No AAA. Extensive progressive retroperitoneal adenopathy. There are multilevel bulky retroperitoneal lymph nodes. No adenopathy was previously seen along the aorta and IVC This is suboptimally evaluated without contrast. Example lymph nodes are as follows: New retrocaval lymph node measuring 2.7 x 1.9 cm on 2:27; New aortocaval lymph node measuring 3.2 x 3.4 cm on 2:30; Numerous other new lymph nodes around the between the IVC and aorta are also noted. There are multiple new left periaortic chain nodes, largest of these is 5.4 x 3.2 cm on 2:36. There is extensive progressive adenopathy in the pelvis. Examples include: A right common iliac chain node measuring 4.9 x 3.9 cm on 2:46, was previously 3.3 x 2.0 cm; Bulky pelvic sidewall nodes with additional progressive lymph nodes in the more distal common iliac chains, for example a left common iliac chain lymph node is estimated 4.9 x 3.5 cm on 2:53, was previously 2.1 x 1.8 cm; There are bulky bilateral external iliac chain nodes. On the right the largest is 6.1 x 4.6 cm on 2:65, previously 6 x 4.5 cm; Largest of the left external iliac chain nodes is 6.2 x 4.9 cm on 2:61, previously was 5 x 3.7 cm. Bulky bilateral  inguinal adenopathy is again noted, largest slightly improved on the right, now 5.6 x 4.0 cm on 2:88, previously was 5.7 x 4.6 cm. Largest left inguinal chain node is less dense than previously possibly due to XRT, today measuring 3.9 x 3.0 cm on 2:84, was previously 5.7 x 4.8 cm. Lymph nodes in medial upper thighs there also slightly improved, on the left measuring 3.6 x 3.0 cm on 2:110, previously 4.0 x 3.6 cm. At the same level on the right, a medial upper thigh lymph node measures 3.0 x 2.6 cm on 2:105, was previously 5.7 x 4.4 cm. 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 The prostate is not enlarged. Other: Minimal pelvic ascites. Diffuse worsening body wall anasarca which could be due to malnutrition, hepatic dysfunction, or fluid overload. There is severe penoscrotal edema, small hydroceles. Mild diffuse haziness to the mesentery is also noted but without focality. Musculoskeletal: Advanced degenerative change lumbar spine. Mild hip DJD. No metastatic bone lesion is seen. Ankylosis right SI joint. IMPRESSION: 1. Extensive progressive retroperitoneal and pelvic adenopathy, with some slightly improved inguinal and medial upper thigh adenopathy. 2. Interval new mild bilateral hydroureteronephrosis, probably due to compressive effect from new extensive retroperitoneal adenopathy. 3. Increasing bladder wall thickening which could be due to cystitis, nondistention, or XRT. 4. Constipation and diverticulosis. 5. Minimal pelvic ascites. 6. Worsening body wall anasarca which could be due to malnutrition, hepatic dysfunction, or fluid overload. 7. Severe penoscrotal edema, small hydroceles. 8. Aortic atherosclerosis. Aortic Atherosclerosis (ICD10-I70.0). Electronically Signed   By: Almira Bar M.D.   On: 09/28/2023 21:55   DG Chest Port 1 View  Result Date: 09/28/2023 CLINICAL DATA:  Shortness of breath with history of lymphoma. Receiving XRT for lymphoma. EXAM:  PORTABLE CHEST 1 VIEW COMPARISON:  Chest CT with contrast 05/29/2023 FINDINGS: Right IJ port catheter again terminates at about the superior cavoatrial junction. A skin fold overlies the outer right upper lung field simulating a pneumothorax. No pneumothorax is seen. The lungs are clear. The sulci are sharp. There is mild cardiomegaly. No vascular congestion is seen. Stable mediastinum with aortic tortuosity and atherosclerosis. DJD both shoulders with no new osseous findings. IMPRESSION: 1. No evidence of acute chest  disease. 2. Mild cardiomegaly. 3. Aortic atherosclerosis. 4. Right IJ port catheter. Electronically Signed   By: Almira Bar M.D.   On: 09/28/2023 21:17      ADDENDUM  .Patient was Personally and independently interviewed, examined and relevant elements of the history of present illness were reviewed in details and an assessment and plan was created. All elements of the patient's history of present illness , assessment and plan were discussed in details with Nyssa Sayegh, Dietrich Pates, N . The above documentation reflects our combined findings assessment and plan.   I had a detailed goals of care discussion and discussion of all the patient's labs and available imaging studies with the patient and his wife at bedside. We had an honest discussion regarding his concerning prognosis.  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  He has previously declined these treatment options over the last several months and has now unfortunately progressed rather rapidly on Pirtobrutinib. 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  He has developed significant anasarca and especially penile and scrotal swelling  as well as lower extremity swelling from severe lymphedema related to his extensive lymphadenopathy.  It is unclear that this can be diuresed significantly due to his renal dysfunction as well as the fact that this is lymphedema and not free fluid. Under control of mantle cell lymphoma would help with the symptoms. We discussed that by supportive cares through hospice would be a reasonable option.  Patient now notes that after previously refusing CAR-T cell therapy that he wants to consider all available treatment options but understands the limitations from his health. I will discuss with Dr. Alphonsa Gin his oncologist at [facility] to see if there are any other options to try to improve his condition and reassess candidacy for CAR-T cell therapy.  However his disease will need to be controlled with a treatment regimen to even get to the point. We discussed that his response to Bendamustine Rituxan and R-CHOP multidrug chemotherapy was very limited in time. We could potentially attempt R gem oxaloplatin though he will need to be stably discharged and have this treatment as outpatient. Would recommend PT OT evaluations to determine discharge needs. Other treatment options including venetoclax plus Rituxan or lenalidomide plus Rituxan are unlikely to control his blastoid variant of mantle cell lymphoma. Will need urology evaluation to determine options to address his bilateral urinary obstructions.  Wyvonnia Lora MD MS

## 2023-10-01 NOTE — Progress Notes (Signed)
Mobility Specialist - Progress Note   10/01/23 1505  Mobility  Activity Ambulated independently to bathroom;Ambulated with assistance in hallway  Level of Assistance Modified independent, requires aide device or extra time  Assistive Device [employer] Ambulated (ft) 220 ft  Range of Motion/Exercises Active  Activity Response Tolerated well  Mobility Referral Yes  $Mobility charge 1 Mobility  Mobility Specialist Start Time (ACUTE ONLY) 1450  Mobility Specialist Stop Time (ACUTE ONLY) 1505  Mobility Specialist Time Calculation (min) (ACUTE ONLY) 15 min   Pt was found in bed and agreeable to ambulate. No complaints with session. At EOS returned to bed with all needs met. Call bell in reach and RN notified.  Billey Chang Mobility Specialist

## 2023-10-01 NOTE — Progress Notes (Signed)
PROGRESS NOTE Jerry Tran  ZOX:[id number] DOB: May 08, 1946 DOA: 09/28/2023 PCP: [name]  Brief Narrative/Hospital Course: 77 y.o. male with medical history significant of mantle cell lymphoma, CKD, diastolic CHF, A-fib, HLD presented with edema, leg swelling and groin swelling for the past 2 weeks and worsening shortness of breath He was seen by oncology recommended him to have CT abdomen and pelvis and also seen by urology who felt that he needs a Foley catheter placement, his penis and testicles has been continuously increased in swelling for the past 2 weeks but he is not having any pain In the ED: Vitals stable afebrile, labs showed BUN/creatinine at 271/2.7- recently on 11/8 was 36/1.3 egfr 54 CBC with similar pancytopenia.   CT scan without>>showed progressive pelvic adenopathy (extensive progressive retroperitoneal and pelvic adenopathy interval new mild bilateral hydroureteronephrosis probably due to compressive effect, increasing bladder wall thickening, constipation, diverticulosis and worsening body wall anasarca, severe penoscrotal edema) Recent PET scan 10//24 with progressive bulky lymphadenopathy suspected lymphomatous involvement in the bilateral upper arms Chest x-ray in the ED unremarkable. Patient was admitted for further management, oncology was consulted    Subjective: Patient seen and examined this morning Is resting comfortably and has no complaint Overnight patient remained afebrile,, vitals otherwise stable Labs shows creatinine continues to improve 2.5, BUN 70 CBC with improving platelet and WBC count hemoglobin stable 9.4 UOP 1.3l charted Leg edema slowly improving he thinks  Assessment and Plan: Principal Problem:   AKI (acute kidney injury) (HCC) Active Problems:   Lymphadenopathy   Hypertension   Dyslipidemia   PAF (paroxysmal atrial fibrillation) (HCC)   Chronic kidney disease, stage 3b (HCC)   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  Continue on prednisone to help with lymphedema and lymph node swelling, patient had declined option for CAR-T cell therapy previously and strongly suggested to reconsider iand is inclined  Dr. Candise Che  to see 12/2-msg sent. Continue prednisone 40 mg, supportive care monitor fluid status. Recent Labs  Lab 09/29/23 0845 09/30/23 0351 10/01/23 0359  HGB 8.7* 9.1* 9.4*  HCT 26.7* 29.3* 30.7*  WBC 2.6* 3.3* 3.6*  PLT 115* 135* 146*    Bilateral hydroureteronephrosis probably due to compression from lymphadenopathy: Patient voiding well creatinine continues to improve slowly.Seen by urology previously will reconsult if needed  AKI on CKD stage IIIa: Creatinine worsening from baseline value of around 1.3. now 3.0> 2.99> 2.6> 2.5 slowly improving, monitor voids and avoid nephrotoxic meds and monitor renal function closely Recent Labs    05/30/23 1306 06/29/23 0937 07/25/23 1132 08/24/23 1020 09/07/23 1429 09/26/23 1529 09/28/23 2038 09/29/23 0845 09/30/23 0351 10/01/23 0359  BUN 32* 32* 35* 47* 36* 71* 87* 82* 76* 70*  CREATININE 1.06 1.09 1.27* 1.29* 1.36* 2.78* 3.08* 2.99* 2.64* 2.55*  CO2 27 26 26 27 22  20* 20* 20* 20* 20*  K 4.0 3.8 4.0 4.4 4.0 4.1 4.3 4.2 3.9 4.3    HLD: Continue Lipitor  Chronic diastolic CHF HTN: BP controlled, last echo with EF 60 to 65% from 10/25/2022  OSA: no longer on CPAP  Atrial fibrillation: Not on anticoagulation, on metoprolol-holding for now.  Goals of care: Currently DNR given  his blastoid  variant not having much option for treatment per Dr. Candise Che recommendation consulting palliative care- oncology wil lsee today  DVT prophylaxis: SCDs Start: 09/29/23 0816 Code Status:   Code Status: Full Code Family Communication: plan of care discussed with patient at bedside. Patient status is: Inpatient because of lymphedema Mantle cell  Level of care: Progressive   Dispo: The patient is from: Home            Anticipated disposition: TBD pending hematology input Objective: Vitals last 24 hrs: Vitals:   09/30/23 1400 09/30/23 1820 09/30/23 2024 10/01/23 0452  BP: 104/64 110/68 116/75 104/73  Pulse: 66 71 74 69  Resp:  14 20   Temp:  97.9 F (36.6 C) 98.3 F (36.8 C) (!) 97.5 F (36.4 C)  TempSrc:  Oral Oral Oral  SpO2:  100% 100%   Weight:      Height:       Weight change:   Physical Examination: General exam: alert awake, oriented at baseline, older than stated age HEENT:Oral mucosa moist, Ear/Nose WNL grossly Respiratory system: Bilaterally clear BS,no use of accessory muscle Cardiovascular system: S1 & S2 +, No JVD. Gastrointestinal system: Abdomen soft,NT,ND, BS+ Nervous System: Alert, awake, moving all extremities,and following commands. Extremities: LE edema +++,distal peripheral pulses palpable and warm.  Skin: No rashes,no icterus. MSK: Normal muscle bulk,tone, power   Medications reviewed:  Scheduled Meds:  atorvastatin  10 mg Oral QHS   docusate sodium  100 mg Oral BID   metoprolol tartrate  12.5 mg Oral BID   mirtazapine  30 mg Oral QHS   pantoprazole  40 mg Oral Daily   predniSONE  40 mg Oral Q breakfast   senna  1 tablet Oral BID   sodium chloride flush  3 mL Intravenous Q12H  Continuous Infusions:   Diet Order             Diet Heart Room service appropriate? Yes; Fluid consistency: Thin  Diet effective now                    Intake/Output Summary (Last 24 hours) at 10/01/2023 0902 Last data filed at 10/01/2023 0525 Gross per 24 hour   Intake 360 ml  Output 600 ml  Net -240 ml   Net IO Since Admission: -280 mL [10/01/23 0902]  Wt Readings from Last 3 Encounters:  09/29/23 104.7 kg  09/26/23 102.7 kg  09/24/23 103.7 kg     Unresulted Labs (From admission, onward)     Start     Ordered   09/30/23 0500  Basic metabolic panel  Daily,   R      09/29/23 1126   09/30/23 0500  CBC  Daily,   R      09/29/23 1126          Data Reviewed: I have personally reviewed following labs and imaging studies CBC: Recent Labs  Lab 09/26/23 1529 09/28/23 2038 09/29/23 0845 09/30/23 0351 10/01/23 0359  WBC 2.9* 2.7* 2.6* 3.3* 3.6*  NEUTROABS 2.0 1.7  --   --   --   HGB 9.5* 9.9* 8.7* 9.1* 9.4*  HCT 29.9* 30.6* 26.7* 29.3* 30.7*  MCV 88.2 88.7 88.7 89.1 91.6  PLT 114* 106* 115* 135* 146*   Basic Metabolic Panel:  Recent Labs  Lab 09/26/23 1529 09/28/23 2038 09/29/23 0845 09/30/23 0351 10/01/23 0359  NA 138 136 137 137 136  K 4.1 4.3 4.2 3.9 4.3  CL 105 106 107 108  107  CO2 20* 20* 20* 20* 20*  GLUCOSE 122* 100* 91 105* 94  BUN 71* 87* 82* 76* 70*  CREATININE 2.78* 3.08* 2.99* 2.64* 2.55*  CALCIUM 9.4 9.2 9.1 9.3 9.5  MG  --   --  2.7*  --   --   PHOS  --   --  4.7*  --   --    GFR: Estimated Creatinine Clearance: 32.3 mL/min (A) (by C-G formula based on SCr of 2.55 mg/dL (H)). Liver Function Tests:  Recent Labs  Lab 09/26/23 1529 09/28/23 2035 09/29/23 0845  AST 38 39 35  ALT 16 15 14   ALKPHOS 68 63 57  BILITOT 0.4 0.4 0.6  PROT 7.2 6.8 6.6  ALBUMIN 4.0 3.7 3.6   Recent Labs  Lab 09/28/23 2035  INR 1.1  No results for input(s): "CHOL", "HDL", "LDLCALC", "TRIG", "CHOLHDL", "LDLDIRECT" in the last 72 hours. Recent Labs    09/29/23 1421  TSH 1.458   No results found for this or any previous visit (from the past 240 hour(s)).  Antimicrobials: Anti-infectives (From admission, onward)    None      Culture/Microbiology none  Radiology Studies: No results found.   LOS: 3 days    Total time spent in review of labs and imaging, patient evaluation, formulation of plan, documentation and communication with family: 35 minutes  Lanae Boast, MD [facility]  10/01/2023, 9:02 AM

## 2023-10-02 ENCOUNTER — Encounter: Payer: Self-pay | Admitting: Hematology

## 2023-10-02 DIAGNOSIS — D61818 Other pancytopenia: Secondary | ICD-10-CM | POA: Diagnosis not present

## 2023-10-02 DIAGNOSIS — C8318 Mantle cell lymphoma, lymph nodes of multiple sites: Secondary | ICD-10-CM | POA: Diagnosis not present

## 2023-10-02 DIAGNOSIS — N179 Acute kidney failure, unspecified: Secondary | ICD-10-CM | POA: Diagnosis not present

## 2023-10-02 LAB — CBC
HCT: 28.5 % — ABNORMAL LOW (ref 39.0–52.0)
Hemoglobin: 8.7 g/dL — ABNORMAL LOW (ref 13.0–17.0)
MCH: 28.2 pg (ref 26.0–34.0)
MCHC: 30.5 g/dL (ref 30.0–36.0)
MCV: 92.2 fL (ref 80.0–100.0)
Platelets: 147 10*3/uL — ABNORMAL LOW (ref 150–400)
RBC: 3.09 MIL/uL — ABNORMAL LOW (ref 4.22–5.81)
RDW: 17.4 % — ABNORMAL HIGH (ref 11.5–15.5)
WBC: 3.4 10*3/uL — ABNORMAL LOW (ref 4.0–10.5)
nRBC: 0 % (ref 0.0–0.2)

## 2023-10-02 LAB — BASIC METABOLIC PANEL
Anion gap: 8 (ref 5–15)
BUN: 63 mg/dL — ABNORMAL HIGH (ref 8–23)
CO2: 18 mmol/L — ABNORMAL LOW (ref 22–32)
Calcium: 9 mg/dL (ref 8.9–10.3)
Chloride: 109 mmol/L (ref 98–111)
Creatinine, Ser: 2.15 mg/dL — ABNORMAL HIGH (ref 0.61–1.24)
GFR, Estimated: 31 mL/min — ABNORMAL LOW (ref >60)
Glucose, Bld: 109 mg/dL — ABNORMAL HIGH (ref 70–99)
Potassium: 3.6 mmol/L (ref 3.5–5.1)
Sodium: 135 mmol/L (ref 135–145)

## 2023-10-02 MED ORDER — ONDANSETRON HCL 8 MG PO TABS: 8.0000 mg | ORAL_TABLET | Freq: Three times a day (TID) | ORAL | 1 refills | Status: DC | PRN

## 2023-10-02 MED ORDER — LIDOCAINE-PRILOCAINE 2.5-2.5 % EX CREA: TOPICAL_CREAM | CUTANEOUS | 3 refills | Status: DC

## 2023-10-02 MED ORDER — PROCHLORPERAZINE MALEATE 10 MG PO TABS: 10.0000 mg | ORAL_TABLET | Freq: Four times a day (QID) | ORAL | 1 refills | Status: DC | PRN

## 2023-10-02 MED ORDER — ALLOPURINOL 100 MG PO TABS: 100.0000 mg | ORAL_TABLET | Freq: Every day | ORAL | 1 refills | Status: DC

## 2023-10-02 MED ORDER — DEXAMETHASONE 4 MG PO TABS: 8.0000 mg | ORAL_TABLET | Freq: Every day | ORAL | 1 refills | Status: DC

## 2023-10-02 NOTE — Progress Notes (Signed)
PROGRESS NOTE Jerry Tran  VHQ:[id number] DOB: 27-Oct-1946 DOA: 09/28/2023 [facility]: [name]  Brief Narrative/Hospital Course: 77 y.o. male with medical history significant of mantle cell lymphoma, CKD, diastolic CHF, A-fib, HLD presented with edema, leg swelling and groin swelling for the past 2 weeks and worsening shortness of breath He was seen by oncology recommended him to have CT abdomen and pelvis and also seen by urology who felt that he needs a Foley catheter placement, his penis and testicles has been continuously increased in swelling for the past 2 weeks but he is not having any pain In the ED: Vitals stable afebrile, labs showed BUN/creatinine at 271/2.7- recently on 11/8 was 36/1.3 egfr 54 CBC with similar pancytopenia.   CT scan without>>showed progressive pelvic adenopathy (extensive progressive retroperitoneal and pelvic adenopathy interval new mild bilateral hydroureteronephrosis probably due to compressive effect, increasing bladder wall thickening, constipation, diverticulosis and worsening body wall anasarca, severe penoscrotal edema) Recent PET scan 10//24 with progressive bulky lymphadenopathy suspected lymphomatous involvement in the bilateral upper arms Chest x-ray in the ED unremarkable. Patient was admitted for further management, oncology was consulted With oral steroid patient's swelling slowly improving, creatinine continues to down trend.    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 Seen by Dr. Candise Che and at this time planning for outpatient chemotherapy.  Previously had declined option for CAR-T cell therapy. On prednisone per oncology. Recent Labs  Lab 09/30/23 0351 10/01/23 0359 10/02/23 0417  HGB 9.1* 9.4* 8.7*  HCT 29.3* 30.7* 28.5*  WBC 3.3* 3.6* 3.4*  PLT 135* 146* 147*    Bilateral hydroureteronephrosis probably due to compression from lymphadenopathy: Patient voiding  and  creatinine continues to improve slowly. Seen by urology previously and notifying NP  AKI on CKD stage IIIa: Creatinine worsening from baseline value of around 1.3.  Creatinine continues to improve slowly continue prednisone, monitor voiding, will notify urology as Cipriano Mile RN to check postvoid residue. Recent Labs    06/29/23 0937 07/25/23 1132 08/24/23 1020 09/07/23 1429 09/26/23 1529 09/28/23 2038 09/29/23 0845 09/30/23 0351 10/01/23 0359 10/02/23 0417  BUN 32* 35* 47* 36* 71* 87* 82* 76* 70* 63*  CREATININE 1.09 1.27* 1.29* 1.36* 2.78* 3.08* 2.99* 2.64* 2.55* 2.15*  CO2 26 26 27 22  20* 20* 20* 20* 20* 18*  K 3.8 4.0 4.4 4.0 4.1 4.3 4.2 3.9 4.3 3.6    HLD: Continue Lipitor  Chronic diastolic CHF HTN: BP controlled, last echo with EF 60 to 65% from 10/25/2022  OSA: no longer on CPAP  Atrial fibrillation: Not on anticoagulation, on metoprolol-holding for now.  Goals of care: Currently DNR given his blastoid variant not having much option for treatment per Dr. Candise Che recommendation consulting palliative care- oncology wil lsee today  DVT prophylaxis: SCDs Start: 09/29/23 0816 Code Status:  Code Status: Full  Code Family Communication: plan of care discussed with patient at bedside. Patient status is: Inpatient because of lymphedema Mantle cell  Level of care: Progressive   Dispo: The patient is from: Home            Anticipated disposition: Anticipate discharge tomorrow once okay with oncology and urology   Objective: Vitals last 24 hrs: Vitals:   10/01/23 0452 10/01/23 1258 10/01/23 2031 10/02/23 0420  BP: 104/73 128/75 110/62 109/71  Pulse: 69 60 72 71  Resp:   18 15  Temp: (!) 97.5 F (36.4 C) 97.9 F (36.6 C) 98.2 F (36.8 C) 98.3 F (36.8 C)  TempSrc: Oral Oral Oral Oral  SpO2:  97% 100% 100%  Weight:      Height:       Weight change:   Physical Examination: General exam: alert awake, oriented at baseline, older than stated age HEENT:Oral mucosa moist, Ear/Nose WNL grossly Respiratory system: Bilaterally clear BS,no use of accessory muscle Cardiovascular system: S1 & S2 +, No JVD. Gastrointestinal system: Abdomen soft,NT,ND, BS+ Nervous System: Alert, awake, moving all extremities,and following commands. Extremities: LE edema ++,distal peripheral pulses palpable and warm.  Skin: No rashes,no icterus. MSK: Normal muscle bulk,tone, power   Medications reviewed:  Scheduled Meds:  atorvastatin  10 mg Oral QHS   docusate sodium  100 mg Oral BID   metoprolol tartrate  12.5 mg Oral BID   mirtazapine  30 mg Oral QHS   multivitamin with minerals  1 tablet Oral Daily   pantoprazole  40 mg Oral Daily   predniSONE  40 mg Oral Q breakfast   senna  1 tablet Oral BID   sodium chloride flush  3 mL Intravenous Q12H  Continuous Infusions:   Diet Order             Diet 2 gram sodium Room service appropriate? Yes; Fluid consistency: Thin  Diet effective now                    Intake/Output Summary (Last 24 hours) at 10/02/2023 1114 Last data filed at 10/02/2023 1002 Gross per 24 hour  Intake 240 ml  Output 1100 ml  Net -860 ml   Net IO Since Admission: -1,220 mL  [10/02/23 1114]  Wt Readings from Last 3 Encounters:  09/29/23 104.7 kg  09/26/23 102.7 kg  09/24/23 103.7 kg     Unresulted Labs (From admission, onward)     Start     Ordered   10/02/23 0000  CBC with Differential (Cancer Center Only)  STAT        10/02/23 1031   10/02/23 0000  CMP (Cancer Center only)  STAT        10/02/23 1031   09/30/23 0500  Basic metabolic panel  Daily,   R      09/29/23 1126   09/30/23 0500  CBC  Daily,   R      09/29/23 1126          Data Reviewed: I have personally reviewed following labs and imaging studies CBC: Recent Labs  Lab 09/26/23 1529 09/28/23 2038 09/29/23 0845 09/30/23 0351 10/01/23 0359 10/02/23 0417  WBC 2.9* 2.7* 2.6* 3.3* 3.6* 3.4*  NEUTROABS 2.0 1.7  --   --   --   --   HGB 9.5* 9.9* 8.7* 9.1* 9.4* 8.7*  HCT 29.9* 30.6* 26.7* 29.3* 30.7* 28.5*  MCV 88.2 88.7 88.7 89.1 91.6 92.2  PLT 114* 106* 115*  135* 146* 147*   Basic Metabolic Panel:  Recent Labs  Lab 09/28/23 2038 09/29/23 0845 09/30/23 0351 10/01/23 0359 10/02/23 0417  NA 136 137 137 136 135  K 4.3 4.2 3.9 4.3 3.6  CL 106 107 108 107 109  CO2 20* 20* 20* 20* 18*  GLUCOSE 100* 91 105* 94 109*  BUN 87* 82* 76* 70* 63*  CREATININE 3.08* 2.99* 2.64* 2.55* 2.15*  CALCIUM 9.2 9.1 9.3 9.5 9.0  MG  --  2.7*  --   --   --   PHOS  --  4.7*  --   --   --    GFR: Estimated Creatinine Clearance: 38.3 mL/min (A) (by C-G formula based on SCr of 2.15 mg/dL (H)). Liver Function Tests:  Recent Labs  Lab 09/26/23 1529 09/28/23 2035 09/29/23 0845  AST 38 39 35  ALT 16 15 14   ALKPHOS 68 63 57  BILITOT 0.4 0.4 0.6  PROT 7.2 6.8 6.6  ALBUMIN 4.0 3.7 3.6   Recent Labs  Lab 09/28/23 2035  INR 1.1  No results for input(s): "CHOL", "HDL", "LDLCALC", "TRIG", "CHOLHDL", "LDLDIRECT" in the last 72 hours. Recent Labs    09/29/23 1421  TSH 1.458   No results found for this or any previous visit (from the past 240 hour(s)).  Antimicrobials: Anti-infectives (From  admission, onward)    None      Culture/Microbiology none  Radiology Studies: No results found.   LOS: 4 days   Total time spent in review of labs and imaging, patient evaluation, formulation of plan, documentation and communication with family: 35 minutes  Lanae Boast, MD [facility]  10/02/2023, 11:14 AM

## 2023-10-02 NOTE — TOC CM/SW Note (Signed)
Transition of Care [facility]) - Inpatient Brief Assessment   Patient Details  Name: Jerry Tran MRN: [id number] Date of Birth: Jun 29, 1946  [facility] CM/SW Contact:    Larrie Kass, LCSW Phone Number: 10/02/2023, 11:11 AM    Transition of Care Asessment: Insurance and Status: Insurance coverage has been reviewed Patient has primary care physician: Yes Home environment has been reviewed: home with spouse Prior level of function:: mod independent Prior/Current Home Services: No current home services Social Determinants of Health Reivew: SDOH reviewed no interventions necessary Readmission risk has been reviewed: Yes Transition of care needs: no transition of care needs at this time

## 2023-10-02 NOTE — Progress Notes (Signed)
DISCONTINUE ON PATHWAY REGIMEN - Lymphoma and CLL     A cycle is every 28 days:     Rituximab-xxxx      Bendamustine   **Always confirm dose/schedule in your pharmacy ordering system**  REASON: Disease Progression PRIOR TREATMENT: [id number]: Bendamustine + Rituximab IV (90/375) q28 Days x 6 Cycles (or 2 Cycles Beyond CR) TREATMENT RESPONSE: Complete Response (CR)  START OFF PATHWAY REGIMEN - Lymphoma and CLL   [id number]:R-GemOx (Rituximab IV + Gemcitabine IV + Oxaliplatin IV) q14 Days x 6-8 Cycles:   A cycle is every 14 days:     Rituximab-xxxx      Gemcitabine      Oxaliplatin   **Always confirm dose/schedule in your pharmacy ordering system**  Patient Characteristics: Mantle Cell Lymphoma, Third Line and Beyond, Prior Covalent BTK Inhibitor, Candidate for CAR T-Cell Therapy, Treating as Specialist or Consulting with Specialist Disease Type: Not Applicable Disease Type: Mantle Cell Lymphoma Disease Type: Not Applicable Line of Therapy: Third Line and Beyond Was TP53 Mutation Testing Completed<= No, TP53 Mutation Testing Not Completed Treatment History: Prior Covalent BTK Inhibitor Patient Characteristics: Candidate for CAR T-Cell Therapy Please indicate whether you are: Consulting with a specialist Intent of Therapy: Non-Curative / Palliative Intent, Discussed with Patient

## 2023-10-02 NOTE — Plan of Care (Signed)

## 2023-10-02 NOTE — Consult Note (Signed)
Urology Consult Note   Requesting Attending Physician:  Lanae Boast, MD Service Providing Consult: Urology  Consulting Attending: Dr. Alvester Morin   Reason for Consult: Bilateral hydronephrosis  HPI: Jerry Tran is seen in consultation for reasons noted above at the request of Lanae Boast, MD. PMH significant for mantle cell lymphoma, CKD, diastolic CHF, A-fib, HLD with edema of the legs and groin for the last 2 weeks with word missing shortness of breath.  [facility] was consulted about mild bilateral hydronephrosis 2/2 extrinsic compression from bulky lymphadenopathy and severe penoscrotal edema.  ------------------  Assessment:  77 y.o. male with bilateral hydronephrosis in the context of lymphoma.   Recommendations: # Bilateral hydronephrosis 2/2 extrinsic lymphatic compression Continue to trend daily labs N.p.o. at midnight Thursday for tentative bilateral ureteral stent placement on Friday. Ureteral stent failure would necessitate percutaneous nephrostomy tubes.  Patient is aware and would like to proceed with ureteroscopy first.  # Penoscrotal edema Diffuse anasarca.  Recommend keeping scrotum/penis elevated.  If his fluid retention can be maintained this will be self-limiting.  Patient reports no difficulty urinating and understands that if at any point he is unable to urinate appropriately he needs to notify this provider so the Foley catheter can be placed.  Case and plan discussed with Dr. Alvester Morin.  Past Medical History: Past Medical History:  Diagnosis Date   Arthritis    Chronic kidney disease, stage 3b (HCC) 12/09/2021   Dyslipidemia    GERD (gastroesophageal reflux disease)    Hypertension    PAF (paroxysmal atrial fibrillation) (HCC)    Prostate cancer (HCC) 03/2021   s/p radiation therapy   PTSD (post-traumatic stress disorder)     Past Surgical History:  Past Surgical History:  Procedure Laterality Date   IR IMAGING GUIDED PORT INSERTION  11/06/2022    LIPOMA EXCISION Right 12/09/2021   Procedure: RIGHT SUPERCLAVICULAR LYMPH NODE EXCISION;  Surgeon: Andria Meuse, MD;  Location: [facility] OR;  Service: General;  Laterality: Right;    Medication: Current Facility-Administered Medications  Medication Dose Route Frequency Provider Last Rate Last Admin   acetaminophen (TYLENOL) tablet 650 mg  650 mg Oral Q6H PRN Doutova, Anastassia, MD       Or   acetaminophen (TYLENOL) suppository 650 mg  650 mg Rectal Q6H PRN Doutova, Anastassia, MD       atorvastatin (LIPITOR) tablet 10 mg  10 mg Oral QHS Doutova, Anastassia, MD   10 mg at 10/01/23 2132   bisacodyl (DULCOLAX) suppository 10 mg  10 mg Rectal Daily PRN Therisa Doyne, MD       docusate sodium (COLACE) capsule 100 mg  100 mg Oral BID Doutova, Anastassia, MD   100 mg at 10/02/23 1041   HYDROcodone-acetaminophen (NORCO/VICODIN) 5-325 MG per tablet 1-2 tablet  1-2 tablet Oral Q4H PRN Doutova, Anastassia, MD       metoprolol tartrate (LOPRESSOR) tablet 12.5 mg  12.5 mg Oral BID Kc, Ramesh, MD   12.5 mg at 10/02/23 1041   mirtazapine (REMERON) tablet 30 mg  30 mg Oral QHS Doutova, Anastassia, MD   30 mg at 10/01/23 2132   multivitamin with minerals tablet 1 tablet  1 tablet Oral Daily Kc, Ramesh, MD   1 tablet at 10/02/23 1041   ondansetron (ZOFRAN) tablet 4 mg  4 mg Oral Q6H PRN Doutova, Anastassia, MD       Or   ondansetron (ZOFRAN) injection 4 mg  4 mg Intravenous Q6H PRN Therisa Doyne, MD  pantoprazole (PROTONIX) EC tablet 40 mg  40 mg Oral Daily Doutova, Anastassia, MD   40 mg at 10/02/23 1041   polyethylene glycol (MIRALAX / GLYCOLAX) packet 17 g  17 g Oral Daily PRN Doutova, Anastassia, MD       predniSONE (DELTASONE) tablet 40 mg  40 mg Oral Q breakfast Doutova, Anastassia, MD   40 mg at 10/02/23 1041   senna (SENOKOT) tablet 8.6 mg  1 tablet Oral BID Doutova, Anastassia, MD   8.6 mg at 10/02/23 1041   sodium chloride flush (NS) 0.9 % injection 3 mL  3 mL Intravenous Q12H  Doutova, Anastassia, MD   3 mL at 10/01/23 2133   sodium chloride flush (NS) 0.9 % injection 3 mL  3 mL Intravenous PRN Therisa Doyne, MD        Allergies: Allergies  Allergen Reactions   Sildenafil Other (See Comments)    Unknown reaction - reported by [facility]    Social History: Social History   Tobacco Use   Smoking status: Former    Current packs/day: 1.00    Average packs/day: 1 pack/day for 15.0 years (15.0 ttl pk-yrs)    Types: Cigarettes   Smokeless tobacco: Never  Substance Use Topics   Alcohol use: Not Currently    Comment: h/o heavy use   Drug use: Not Currently    Types: Marijuana, Cocaine    Comment: remote    Family History Family History  Problem Relation Age of Onset   Cancer Neg Hx     Review of Systems  Genitourinary:  Negative for dysuria, flank pain, frequency, hematuria and urgency.     Objective   Vital signs in last 24 hours: BP 109/71 (BP Location: Right Arm)   Pulse 71   Temp 98.3 F (36.8 C) (Oral)   Resp 15   Ht 6\' 4"  (1.93 m)   Wt 104.7 kg   SpO2 100%   BMI 28.10 kg/m   Physical Exam General: NAD, A&O, resting, appropriate HEENT: [place]/AT Pulmonary: Normal work of breathing Cardiovascular: RRR, no cyanosis Abdomen: Soft, NTTP, nondistended GU: Severely swollen penis and scrotum. Neuro: Appropriate, no focal neurological deficits  Most Recent Labs: Lab Results  Component Value Date   WBC 3.4 (L) 10/02/2023   HGB 8.7 (L) 10/02/2023   HCT 28.5 (L) 10/02/2023   PLT 147 (L) 10/02/2023    Lab Results  Component Value Date   NA 135 10/02/2023   K 3.6 10/02/2023   CL 109 10/02/2023   CO2 18 (L) 10/02/2023   BUN 63 (H) 10/02/2023   CREATININE 2.15 (H) 10/02/2023   CALCIUM 9.0 10/02/2023   MG 2.7 (H) 09/29/2023   PHOS 4.7 (H) 09/29/2023    Lab Results  Component Value Date   INR 1.1 09/28/2023     Urine Culture: @[id number] (laburin,org,[id number],[id number])@   IMAGING: No results found.  ------  Elmon Kirschner, NP Pager: [phone]   Please contact the urology consult pager with any further questions/concerns.

## 2023-10-02 NOTE — H&P (View-Only) (Signed)
 Urology Consult Note   Requesting Attending Physician:  Lanae Boast, MD Service Providing Consult: Urology  Consulting Attending: Dr. Alvester Morin   Reason for Consult: Bilateral hydronephrosis  HPI: Jerry Tran is seen in consultation for reasons noted above at the request of Lanae Boast, MD. PMH significant for mantle cell lymphoma, CKD, diastolic CHF, A-fib, HLD with edema of the legs and groin for the last 2 weeks with word missing shortness of breath.  [facility] was consulted about mild bilateral hydronephrosis 2/2 extrinsic compression from bulky lymphadenopathy and severe penoscrotal edema.  ------------------  Assessment:  77 y.o. male with bilateral hydronephrosis in the context of lymphoma.   Recommendations: # Bilateral hydronephrosis 2/2 extrinsic lymphatic compression Continue to trend daily labs N.p.o. at midnight Thursday for tentative bilateral ureteral stent placement on Friday. Ureteral stent failure would necessitate percutaneous nephrostomy tubes.  Patient is aware and would like to proceed with ureteroscopy first.  # Penoscrotal edema Diffuse anasarca.  Recommend keeping scrotum/penis elevated.  If his fluid retention can be maintained this will be self-limiting.  Patient reports no difficulty urinating and understands that if at any point he is unable to urinate appropriately he needs to notify this provider so the Foley catheter can be placed.  Case and plan discussed with Dr. Alvester Morin.  Past Medical History: Past Medical History:  Diagnosis Date   Arthritis    Chronic kidney disease, stage 3b (HCC) 12/09/2021   Dyslipidemia    GERD (gastroesophageal reflux disease)    Hypertension    PAF (paroxysmal atrial fibrillation) (HCC)    Prostate cancer (HCC) 03/2021   s/p radiation therapy   PTSD (post-traumatic stress disorder)     Past Surgical History:  Past Surgical History:  Procedure Laterality Date   IR IMAGING GUIDED PORT INSERTION  11/06/2022    LIPOMA EXCISION Right 12/09/2021   Procedure: RIGHT SUPERCLAVICULAR LYMPH NODE EXCISION;  Surgeon: Andria Meuse, MD;  Location: [facility] OR;  Service: General;  Laterality: Right;    Medication: Current Facility-Administered Medications  Medication Dose Route Frequency Provider Last Rate Last Admin   acetaminophen (TYLENOL) tablet 650 mg  650 mg Oral Q6H PRN Doutova, Anastassia, MD       Or   acetaminophen (TYLENOL) suppository 650 mg  650 mg Rectal Q6H PRN Doutova, Anastassia, MD       atorvastatin (LIPITOR) tablet 10 mg  10 mg Oral QHS Doutova, Anastassia, MD   10 mg at 10/01/23 2132   bisacodyl (DULCOLAX) suppository 10 mg  10 mg Rectal Daily PRN Therisa Doyne, MD       docusate sodium (COLACE) capsule 100 mg  100 mg Oral BID Doutova, Anastassia, MD   100 mg at 10/02/23 1041   HYDROcodone-acetaminophen (NORCO/VICODIN) 5-325 MG per tablet 1-2 tablet  1-2 tablet Oral Q4H PRN Doutova, Anastassia, MD       metoprolol tartrate (LOPRESSOR) tablet 12.5 mg  12.5 mg Oral BID Kc, Ramesh, MD   12.5 mg at 10/02/23 1041   mirtazapine (REMERON) tablet 30 mg  30 mg Oral QHS Doutova, Anastassia, MD   30 mg at 10/01/23 2132   multivitamin with minerals tablet 1 tablet  1 tablet Oral Daily Kc, Ramesh, MD   1 tablet at 10/02/23 1041   ondansetron (ZOFRAN) tablet 4 mg  4 mg Oral Q6H PRN Doutova, Anastassia, MD       Or   ondansetron (ZOFRAN) injection 4 mg  4 mg Intravenous Q6H PRN Therisa Doyne, MD  pantoprazole (PROTONIX) EC tablet 40 mg  40 mg Oral Daily Doutova, Anastassia, MD   40 mg at 10/02/23 1041   polyethylene glycol (MIRALAX / GLYCOLAX) packet 17 g  17 g Oral Daily PRN Doutova, Anastassia, MD       predniSONE (DELTASONE) tablet 40 mg  40 mg Oral Q breakfast Doutova, Anastassia, MD   40 mg at 10/02/23 1041   senna (SENOKOT) tablet 8.6 mg  1 tablet Oral BID Doutova, Anastassia, MD   8.6 mg at 10/02/23 1041   sodium chloride flush (NS) 0.9 % injection 3 mL  3 mL Intravenous Q12H  Doutova, Anastassia, MD   3 mL at 10/01/23 2133   sodium chloride flush (NS) 0.9 % injection 3 mL  3 mL Intravenous PRN Therisa Doyne, MD        Allergies: Allergies  Allergen Reactions   Sildenafil Other (See Comments)    Unknown reaction - reported by [employer]    Social History: Social History   Tobacco Use   Smoking status: Former    Current packs/day: 1.00    Average packs/day: 1 pack/day for 15.0 years (15.0 ttl pk-yrs)    Types: Cigarettes   Smokeless tobacco: Never  Substance Use Topics   Alcohol use: Not Currently    Comment: h/o heavy use   Drug use: Not Currently    Types: Marijuana, Cocaine    Comment: remote    Family History Family History  Problem Relation Age of Onset   Cancer Neg Hx     Review of Systems  Genitourinary:  Negative for dysuria, flank pain, frequency, hematuria and urgency.     Objective   Vital signs in last 24 hours: BP 109/71 (BP Location: Right Arm)   Pulse 71   Temp 98.3 F (36.8 C) (Oral)   Resp 15   Ht 6\' 4"  (1.93 m)   Wt 104.7 kg   SpO2 100%   BMI 28.10 kg/m   Physical Exam General: NAD, A&O, resting, appropriate HEENT: [place]/AT Pulmonary: Normal work of breathing Cardiovascular: RRR, no cyanosis Abdomen: Soft, NTTP, nondistended GU: Severely swollen penis and scrotum. Neuro: Appropriate, no focal neurological deficits  Most Recent Labs: Lab Results  Component Value Date   WBC 3.4 (L) 10/02/2023   HGB 8.7 (L) 10/02/2023   HCT 28.5 (L) 10/02/2023   PLT 147 (L) 10/02/2023    Lab Results  Component Value Date   NA 135 10/02/2023   K 3.6 10/02/2023   CL 109 10/02/2023   CO2 18 (L) 10/02/2023   BUN 63 (H) 10/02/2023   CREATININE 2.15 (H) 10/02/2023   CALCIUM 9.0 10/02/2023   MG 2.7 (H) 09/29/2023   PHOS 4.7 (H) 09/29/2023    Lab Results  Component Value Date   INR 1.1 09/28/2023     Urine Culture: @[id number] (laburin,org,[id number],[id number])@   IMAGING: No results found.  ------  Elmon Kirschner, NP Pager: [phone]   Please contact the urology consult pager with any further questions/concerns.

## 2023-10-03 ENCOUNTER — Other Ambulatory Visit: Payer: Self-pay | Admitting: Urology

## 2023-10-03 ENCOUNTER — Other Ambulatory Visit: Payer: Self-pay

## 2023-10-03 DIAGNOSIS — N179 Acute kidney failure, unspecified: Secondary | ICD-10-CM | POA: Diagnosis not present

## 2023-10-03 DIAGNOSIS — C8318 Mantle cell lymphoma, lymph nodes of multiple sites: Secondary | ICD-10-CM | POA: Diagnosis not present

## 2023-10-03 DIAGNOSIS — Z515 Encounter for palliative care: Secondary | ICD-10-CM | POA: Diagnosis not present

## 2023-10-03 DIAGNOSIS — Z7189 Other specified counseling: Secondary | ICD-10-CM | POA: Diagnosis not present

## 2023-10-03 DIAGNOSIS — D61818 Other pancytopenia: Secondary | ICD-10-CM

## 2023-10-03 LAB — BASIC METABOLIC PANEL
Anion gap: 8 (ref 5–15)
BUN: 65 mg/dL — ABNORMAL HIGH (ref 8–23)
CO2: 19 mmol/L — ABNORMAL LOW (ref 22–32)
Calcium: 9.2 mg/dL (ref 8.9–10.3)
Chloride: 111 mmol/L (ref 98–111)
Creatinine, Ser: 2.34 mg/dL — ABNORMAL HIGH (ref 0.61–1.24)
GFR, Estimated: 28 mL/min — ABNORMAL LOW (ref >60)
Glucose, Bld: 86 mg/dL (ref 70–99)
Potassium: 3.9 mmol/L (ref 3.5–5.1)
Sodium: 138 mmol/L (ref 135–145)

## 2023-10-03 LAB — CBC
HCT: 26.8 % — ABNORMAL LOW (ref 39.0–52.0)
Hemoglobin: 8.4 g/dL — ABNORMAL LOW (ref 13.0–17.0)
MCH: 28.6 pg (ref 26.0–34.0)
MCHC: 31.3 g/dL (ref 30.0–36.0)
MCV: 91.2 fL (ref 80.0–100.0)
Platelets: 138 10*3/uL — ABNORMAL LOW (ref 150–400)
RBC: 2.94 MIL/uL — ABNORMAL LOW (ref 4.22–5.81)
RDW: 17.2 % — ABNORMAL HIGH (ref 11.5–15.5)
WBC: 3.8 10*3/uL — ABNORMAL LOW (ref 4.0–10.5)
nRBC: 0 % (ref 0.0–0.2)

## 2023-10-03 MED ORDER — SODIUM CHLORIDE 0.9% FLUSH: 10.0000 mL | INTRAVENOUS | Status: DC | PRN

## 2023-10-03 MED ORDER — CHLORHEXIDINE GLUCONATE CLOTH 2 % EX PADS
6.0000 | MEDICATED_PAD | Freq: Every day | CUTANEOUS | Status: DC
Start: 2023-10-03 — End: 2023-10-22
  Administered 2023-10-03 – 2023-10-22 (×19): 6 via TOPICAL

## 2023-10-03 NOTE — Progress Notes (Signed)
This RN received report from previous shift Char[employer] fundraiser. Agree with previous RN assessment. Pt comfortably watching TV in bed.

## 2023-10-03 NOTE — Progress Notes (Addendum)
Jerry Tran   DOB:22-Feb-1946   GN#:[id number]    Hematology oncology inpatient progress note Date of service 10/02/2023  Subjective:  Patient appears in better spirits today and is lying awake and alert and eating this evening.  Wife is at bedside. He notes some decrease in lower extremity swelling.  Notes he has been able to pass urine better.  He has been evaluated by urology and notes that they have consented him for ureteral stenting on Friday. I did discuss with him that his case was discussed with Dr. Alphonsa Gin and we agreed that he could proceed with R gem ox and she will be evaluating him for possible trial with BITE and subsequently also possibly reconsider him for CAR-T cell treatments. We discussed that all these treatments depend on his functional status and how he is functioning. At this point he is motivated to consider all treatment options after previously having declined the option of CAR-T cell therapy.  Objective:  Vitals:   10/03/23 0533 10/03/23 1256  BP: 109/76 110/76  Pulse: (!) 56 69  Resp: 18 16  Temp: (!) 97.5 F (36.4 C) 99.1 F (37.3 C)  SpO2: 100% 100%     Intake/Output Summary (Last 24 hours) at 10/03/2023 1645 Last data filed at 10/03/2023 1400 Gross per 24 hour  Intake 360 ml  Output 1370 ml  Net -1010 ml     REVIEW OF SYSTEMS:   .10 Point review of Systems was done is negative except as noted above.   PHYSICAL EXAMINATION: ECOG PERFORMANCE STATUS: 3 - Symptomatic, >50% confined to bed  Vitals:   10/03/23 0533 10/03/23 1256  BP: 109/76 110/76  Pulse: (!) 56 69  Resp: 18 16  Temp: (!) 97.5 F (36.4 C) 99.1 F (37.3 C)  SpO2: 100% 100%   Filed Weights   09/28/23 2012 09/29/23 1312  Weight: 230 lb (104.3 kg) 230 lb 13.2 oz (104.7 kg)    GENERAL:alert, no distress and comfortable LUNGS: clear to auscultation and percussion with normal breathing effort HEART: regular rate & rhythm and no murmurs and no lower extremity edema ABDOMEN:abdomen  soft, non-tender and normal bowel sounds Musculoskeletal: Bilateral 2-3+ pitting pedal edema lower extremities bilaterally PSYCH: alert & oriented x 3 with fluent speech    Labs Reviewed: Marland Kitchen    Latest Ref Rng & Units 10/02/2023    4:17 AM 10/01/2023    3:59 AM  CBC  WBC 4.0 - 10.5 K/uL 3.4  3.6   Hemoglobin 13.0 - 17.0 g/dL 8.7  9.4   Hematocrit 78.4 - 52.0 % 28.5  30.7   Platelets 150 - 400 K/uL 147  146    .    Latest Ref Rng & Units 10/02/2023    4:17 AM 10/01/2023    3:59 AM  CMP  Glucose 70 - 99 mg/dL 696  94   BUN 8 - 23 mg/dL 63  70   Creatinine 2.95 - 1.24 mg/dL 2.84  1.32   Sodium 440 - 145 mmol/L 135  136   Potassium 3.5 - 5.1 mmol/L 3.6  4.3   Chloride 98 - 111 mmol/L 109  107   CO2 22 - 32 mmol/L 18  20   Calcium 8.9 - 10.3 mg/dL 9.0  9.5      Studies Reviewed:  CT ABDOMEN PELVIS WO CONTRAST  Result Date: 09/28/2023 CLINICAL DATA:  Lower extremity and groin swelling for 2 weeks with shortness of breath. Most recent PET-CT with extensive bulky abdominopelvic adenopathy. Currently undergoing  XRT for lymphoma. EXAM: CT ABDOMEN AND PELVIS WITHOUT CONTRAST TECHNIQUE: Multidetector CT imaging of the abdomen and pelvis was performed following the standard protocol without IV contrast. RADIATION DOSE REDUCTION: This exam was performed according to the departmental dose-optimization program which includes automated exposure control, adjustment of the mA and/or kV according to patient size and/or use of iterative reconstruction technique. COMPARISON:  PET-CT 08/03/2023, CT chest, abdomen and pelvis with IV contrast 05/29/2023. FINDINGS: Lower chest: No acute abnormality. No lung nodules. The cardiac size is normal. The coronary blood pool is less dense than the myocardium consistent with anemia. No pericardial effusion. Hepatobiliary: There are several small scattered hepatic cysts, with no follow-up imaging required. No new liver abnormality is seen without contrast. The  gallbladder and bile ducts are unremarkable. Pancreas: Unremarkable without contrast. Spleen: Unremarkable without contrast.  No splenomegaly. Adrenals/Urinary Tract: There is no adrenal mass. No contour deforming abnormality of either unenhanced kidneys. Interval new mild bilateral hydroureteronephrosis. There is no urinary stone. This is probably due to compressive effect related to extensive progressive bilateral retroperitoneal adenopathy. The bladder is not fully distended but does appear increasingly thickened, which could be due to cystitis, nondistention, or XRT. Stomach/Bowel: No dilatation or wall thickening is seen without contrast. There is mild-to-moderate fecal stasis. Diverticulosis is noted without focal colitis or diverticulitis. The appendix is normal. Vascular/Lymphatic: Mild aortic atherosclerosis. No AAA. Extensive progressive retroperitoneal adenopathy. There are multilevel bulky retroperitoneal lymph nodes. No adenopathy was previously seen along the aorta and IVC This is suboptimally evaluated without contrast. Example lymph nodes are as follows: New retrocaval lymph node measuring 2.7 x 1.9 cm on 2:27; New aortocaval lymph node measuring 3.2 x 3.4 cm on 2:30; Numerous other new lymph nodes around the between the IVC and aorta are also noted. There are multiple new left periaortic chain nodes, largest of these is 5.4 x 3.2 cm on 2:36. There is extensive progressive adenopathy in the pelvis. Examples include: A right common iliac chain node measuring 4.9 x 3.9 cm on 2:46, was previously 3.3 x 2.0 cm; Bulky pelvic sidewall nodes with additional progressive lymph nodes in the more distal common iliac chains, for example a left common iliac chain lymph node is estimated 4.9 x 3.5 cm on 2:53, was previously 2.1 x 1.8 cm; There are bulky bilateral external iliac chain nodes. On the right the largest is 6.1 x 4.6 cm on 2:65, previously 6 x 4.5 cm; Largest of the left external iliac chain nodes is  6.2 x 4.9 cm on 2:61, previously was 5 x 3.7 cm. Bulky bilateral inguinal adenopathy is again noted, largest slightly improved on the right, now 5.6 x 4.0 cm on 2:88, previously was 5.7 x 4.6 cm. Largest left inguinal chain node is less dense than previously possibly due to XRT, today measuring 3.9 x 3.0 cm on 2:84, was previously 5.7 x 4.8 cm. Lymph nodes in medial upper thighs there also slightly improved, on the left measuring 3.6 x 3.0 cm on 2:110, previously 4.0 x 3.6 cm. At the same level on the right, a medial upper thigh lymph node measures 3.0 x 2.6 cm on 2:105, was previously 5.7 x 4.4 cm. 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 The prostate is not enlarged. Other: Minimal pelvic ascites. Diffuse worsening body wall anasarca which could be due to malnutrition, hepatic dysfunction, or fluid overload. There is severe penoscrotal edema, small hydroceles. Mild diffuse haziness to the mesentery is also noted but without focality.  Musculoskeletal: Advanced degenerative change lumbar spine. Mild hip DJD. No metastatic bone lesion is seen. Ankylosis right SI joint. IMPRESSION: 1. Extensive progressive retroperitoneal and pelvic adenopathy, with some slightly improved inguinal and medial upper thigh adenopathy. 2. Interval new mild bilateral hydroureteronephrosis, probably due to compressive effect from new extensive retroperitoneal adenopathy. 3. Increasing bladder wall thickening which could be due to cystitis, nondistention, or XRT. 4. Constipation and diverticulosis. 5. Minimal pelvic ascites. 6. Worsening body wall anasarca which could be due to malnutrition, hepatic dysfunction, or fluid overload. 7. Severe penoscrotal edema, small hydroceles. 8. Aortic atherosclerosis. Aortic Atherosclerosis (ICD10-I70.0). Electronically Signed   By: Almira Bar M.D.   On: 09/28/2023 21:55   DG Chest Port 1 View  Result Date: 09/28/2023 CLINICAL DATA:  Shortness of breath  with history of lymphoma. Receiving XRT for lymphoma. EXAM: PORTABLE CHEST 1 VIEW COMPARISON:  Chest CT with contrast 05/29/2023 FINDINGS: Right IJ port catheter again terminates at about the superior cavoatrial junction. A skin fold overlies the outer right upper lung field simulating a pneumothorax. No pneumothorax is seen. The lungs are clear. The sulci are sharp. There is mild cardiomegaly. No vascular congestion is seen. Stable mediastinum with aortic tortuosity and atherosclerosis. DJD both shoulders with no new osseous findings. IMPRESSION: 1. No evidence of acute chest disease. 2. Mild cardiomegaly. 3. Aortic atherosclerosis. 4. Right IJ port catheter. Electronically Signed   By: Almira Bar M.D.   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 Status post double lines of treatment with R-CHOP, Bendamustine Rituxan, ibrutinib, pirtobrutinib. He was referred for CAR-T cell therapy to Dr.Vaidya but initially declined this when he was in good shape to pursue this. He has now changed his mind and after detailed goals of care discussion wants to consider additional treatment options. He is clearly progressing on Pirtobrutinib at this time.  2. Extensive retroperitoneal and pelvic lymphadenopathy. Lymphedema/LN swelling -likely due to significant involvement of abdominal and pelvic lymph nodes by his blastoid variant mantle cell lymphoma.  3. 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  4.  Bilateral hydronephrosis due to ureteral obstruction from mantle cell lymphoma. Plan -10/02/2023 were discussed in detail with the patient with his wife at bedside. -I discussed his case with Dr. Alphonsa Gin at [facility].  She notes that they might have a trial for BITE therapies for mantle cell lymphoma.  He is in the process  of being evaluated by their clinical trials nurse. -In the interim we discussed and agreed that it would be reasonable to proceed with our R-GEM-OX.  -Patient is agreeable to proceed with this and I have placed the orders for this and he is scheduled to start treatment as outpatient on Monday, 10/08/2023. -He will also be evaluated for possible BITE rx clinical trial -transfusional support as needed for hgb <7 and platelet count <20k.  No transfusional intervention warranted at this time. -Reasonable to continue prednisone 40 mg p.o. daily till he is able to start his outpatient treatment. -He has been eval by urology with plans for ureteral stenting to address his obstruction--plan for procedure on 10/05/2023. -off Pirtobrutinib  .The total time spent in the appointment was 50 minutes* .  All of the patient's questions were answered with apparent satisfaction. The patient knows to call the clinic with any problems, questions or concerns.  Wyvonnia Lora MD MS AAHIVMS [facility] Hematology/Oncology Physician [facility]  .*Total Encounter Time as defined by the Centers for Medicare and Medicaid Services includes, in addition to the face-to-face time of a patient visit (documented in the note above) non-face-to-face time: obtaining and reviewing outside history, ordering and reviewing medications, tests or procedures, care coordination (communications with other health care professionals or caregivers) and documentation in the medical record.

## 2023-10-03 NOTE — Progress Notes (Signed)
PROGRESS NOTE Jerry Tran  ZOX:[id number] DOB: 12-25-1945 DOA: 09/28/2023 PCP: [name]  Brief Narrative/Hospital Course: 77 y.o. male with medical history significant of mantle cell lymphoma, CKD, diastolic CHF, A-fib, HLD presented with edema, leg swelling and groin swelling for the past 2 weeks and worsening shortness of breath He was seen by oncology recommended him to have CT abdomen and pelvis and also seen by urology who felt that he needs a Foley catheter placement, his penis and testicles has been continuously increased in swelling for the past 2 weeks but he is not having any pain In the ED: Vitals stable afebrile, labs showed BUN/creatinine at 271/2.7- recently on 11/8 was 36/1.3 egfr 54 CBC with similar pancytopenia. CT scan without>>showed progressive pelvic adenopathy (extensive progressive retroperitoneal and pelvic adenopathy interval new mild bilateral hydroureteronephrosis probably due to compressive effect, increasing bladder wall thickening, constipation, diverticulosis and worsening body wall anasarca, severe penoscrotal edema) Recent PET scan 10//24 with progressive bulky lymphadenopathy suspected lymphomatous involvement in the b/l upper arms Chest x-ray in the ED unremarkable. Patient was admitted for further management, oncology was consulted. With oral steroid patient's swelling slowly improving, creatinine continues to down trend.  Oncology at this time is planning for outpatient chemotherapy as patient has opted to go ahead with it.  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 Seen by Dr. Candise Che and at this time planning for outpatient chemotherapy soon. Previously had declined option for CAR-T cell therapy. Cont 40 mg prednisone daily per oncology. Recent Labs  Lab 10/01/23 0359 10/02/23 0417 10/03/23 0409  HGB 9.4* 8.7* 8.4*  HCT 30.7* 28.5* 26.8*  WBC 3.6* 3.4* 3.8*  PLT 146* 147* 138*    Bilateral hydroureteronephrosis probably due to compression from lymphadenopathy: Patient voiding but creatinine mains elevated, slightly worsened today, urology input appreciated planning for bilateral ureteric stent 12/6 await timing and recommendations from Uro.  AKI on CKD stage IIIa: Obstructive uropathy: Creatinine worsening from baseline value of around 1.3.  Creatinine was improving slowly however this morning trending up.  Planning for bilateral ureteral stent as above.   Recent Labs    07/25/23 1132 08/24/23 1020 09/07/23 1429 09/26/23 1529 09/28/23 2038 09/29/23 0845 09/30/23 0351 10/01/23 0359 10/02/23 0417 10/03/23 0409  BUN 35* 47* 36* 71* 87* 82* 76* 70* 63* 65*  CREATININE 1.27* 1.29* 1.36* 2.78* 3.08* 2.99* 2.64* 2.55* 2.15* 2.34*  CO2 26 27 22  20* 20* 20* 20* 20* 18* 19*  K 4.0 4.4 4.0 4.1 4.3 4.2 3.9 4.3 3.6 3.9    HLD: Continue Lipitor  Chronic diastolic CHF HTN: BP is stable.  Continue metoprolol. last echo with EF 60 to 65% from  10/25/2022  OSA:  no longer on CPAP  Atrial fibrillation: Not on anticoagulation, cont metoprolol  Goals of care: Full CODE STATUS, palliative care has been consulted. Plan is to proceed with further chemotherapy.  DVT prophylaxis: SCDs Start: 09/29/23 0816 Code Status:   Code Status: Full Code Family Communication: plan of care discussed with patient at bedside. Patient status is: Inpatient because of lymphedema Mantle cell  Level of care: Telemetry   Dispo: The patient is from: Home            Anticipated disposition: Anticipate discharge once okay with urology and oncology   Objective: Vitals last 24 hrs: Vitals:   10/02/23 0420 10/02/23 1638 10/02/23 2219 10/03/23 0533  BP: 109/71 108/69 116/71 109/76  Pulse: 71 70 70 (!) 56  Resp: 15 16 17 18   Temp: 98.3 F (36.8 C) 98.7 F (37.1 C) 99.1 F (37.3 C) (!) 97.5 F (36.4 C)  TempSrc: Oral Oral Oral Oral  SpO2: 100% 98% 100% 100%  Weight:      Height:       Weight change:   Physical Examination: General exam: alert awake, orientedx3, pleasant HEENT:Oral mucosa moist, Ear/Nose WNL grossly Respiratory system: Bilaterally clear BS,no use of accessory muscle Cardiovascular system: S1 & S2 +, No JVD. Gastrointestinal system: Abdomen soft,NT,ND, BS+ Nervous System: Alert, awake, moving all extremities,and following commands. Extremities: LE edema ++,distal peripheral pulses palpable and warm.  Skin: No rashes,no icterus. MSK: Normal muscle bulk,tone, power   Medications reviewed:  Scheduled Meds:  atorvastatin  10 mg Oral QHS   docusate sodium  100 mg Oral BID   metoprolol tartrate  12.5 mg Oral BID   mirtazapine  30 mg Oral QHS   multivitamin with minerals  1 tablet Oral Daily   pantoprazole  40 mg Oral Daily   predniSONE  40 mg Oral Q breakfast   senna  1 tablet Oral BID   sodium chloride flush  3 mL Intravenous Q12H  Continuous Infusions:   Diet Order             Diet NPO time specified  Diet effective  midnight           Diet 2 gram sodium Room service appropriate? Yes; Fluid consistency: Thin  Diet effective now                    Intake/Output Summary (Last 24 hours) at 10/03/2023 1146 Last data filed at 10/03/2023 0900 Gross per 24 hour  Intake 480 ml  Output 988 ml  Net -508 ml   Net IO Since Admission: -1,488 mL [10/03/23 1146]  Wt Readings from Last 3 Encounters:  09/29/23 104.7 kg  09/26/23 102.7 kg  09/24/23 103.7 kg     Unresulted Labs (From admission, onward)     Start     Ordered   10/02/23 0000  CBC with Differential (Cancer Center Only)  STAT        10/02/23 1031   10/02/23 0000  CMP (Cancer Center only)  STAT        10/02/23 1031   09/30/23 0500  Basic metabolic panel  Daily,   R      09/29/23 1126   09/30/23 0500  CBC  Daily,   R      09/29/23 1126          Data Reviewed: I have personally reviewed following labs and imaging studies CBC: Recent Labs  Lab 09/26/23 1529 09/26/23 1529 09/28/23 2038 09/29/23 0845 09/30/23 0351 10/01/23  1324 10/02/23 0417 10/03/23 0409  WBC 2.9*   < > 2.7* 2.6* 3.3* 3.6* 3.4* 3.8*  NEUTROABS 2.0  --  1.7  --   --   --   --   --   HGB 9.5*   < > 9.9* 8.7* 9.1* 9.4* 8.7* 8.4*  HCT 29.9*  --  30.6* 26.7* 29.3* 30.7* 28.5* 26.8*  MCV 88.2  --  88.7 88.7 89.1 91.6 92.2 91.2  PLT 114*   < > 106* 115* 135* 146* 147* 138*   < > = values in this interval not displayed.   Basic Metabolic Panel:  Recent Labs  Lab 09/29/23 0845 09/30/23 0351 10/01/23 0359 10/02/23 0417 10/03/23 0409  NA 137 137 136 135 138  K 4.2 3.9 4.3 3.6 3.9  CL 107 108 107 109 111  CO2 20* 20* 20* 18* 19*  GLUCOSE 91 105* 94 109* 86  BUN 82* 76* 70* 63* 65*  CREATININE 2.99* 2.64* 2.55* 2.15* 2.34*  CALCIUM 9.1 9.3 9.5 9.0 9.2  MG 2.7*  --   --   --   --   PHOS 4.7*  --   --   --   --    GFR: Estimated Creatinine Clearance: 35.1 mL/min (A) (by C-G formula based on SCr of 2.34 mg/dL (H)). Liver Function Tests:  Recent Labs  Lab  09/26/23 1529 09/28/23 2035 09/29/23 0845  AST 38 39 35  ALT 16 15 14   ALKPHOS 68 63 57  BILITOT 0.4 0.4 0.6  PROT 7.2 6.8 6.6  ALBUMIN 4.0 3.7 3.6   Recent Labs  Lab 09/28/23 2035  INR 1.1  No results for input(s): "CHOL", "HDL", "LDLCALC", "TRIG", "CHOLHDL", "LDLDIRECT" in the last 72 hours. No results for input(s): "TSH", "T4TOTAL", "FREET4", "T3FREE", "THYROIDAB" in the last 72 hours.  No results found for this or any previous visit (from the past 240 hour(s)).  Antimicrobials: Anti-infectives (From admission, onward)    None      Culture/Microbiology none  Radiology Studies: No results found.   LOS: 5 days   Total time spent in review of labs and imaging, patient evaluation, formulation of plan, documentation and communication with family: 35 minutes  Lanae Boast, MD [facility]  10/03/2023, 11:46 AM

## 2023-10-03 NOTE — Consult Note (Signed)
Consultation Note Date: 10/03/2023   Patient Name: Jerry Tran  DOB: [birth year]  MRN: [id number]  Age / Sex: 77 y.o., male  PCP: [name] Referring Physician: Lanae Boast, MD  Reason for Consultation: Establishing goals of care  HPI/Patient Profile: 77 y.o. male  with past medical history of mantle cell lymphoma, CKD stage 3a, HFpEF, afib, HLD admitted on 09/28/2023 with leg swelling and groin swelling with shortness of breath. Found to have progressing lymphadenopathy with compression and bilateral hydronephrosis and plans for bilateral ureteral stent placement. Following with Dr. Candise Che for further cancer treatment options.   Clinical Assessment and Goals of Care: Consult received and chart review completed. I met today with Jerry Tran. No family at bedside. Jerry Tran shares with me about his journey with his lymphoma. 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 He is focusing on his faith and trust in God to help him know how to proceed. He is looking at the positive aspects of being able to witness to people he has come in contact with because of his illness. He is hoping that Dr. Candise Che can assist him with further treatment options. He shares many anecdotes with me about how he has been able to use his faith through his health journey and this helps him to remain positive and keep moving forward. He continues to pray for answers on the right decisions to make for himself moving forward.   All questions/concerns addressed. Emotional support provided.   Primary Decision Maker PATIENT    SUMMARY OF RECOMMENDATIONS   - Ongoing goals of care conversations - Would benefit from follow up with palliative at [facility] with Willette Alma NP  Code Status/Advance Care Planning: Full code   Symptom Management:  Per attending, oncology  Prognosis:   Overall prognosis poor with advancing cancer.   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 The following aspects are pertinent.  Past Medical History:  Diagnosis Date   Arthritis    Chronic kidney disease, stage 3b (HCC) 12/09/2021   Dyslipidemia    GERD (gastroesophageal reflux disease)    Hypertension    PAF (paroxysmal atrial fibrillation) (HCC)    Prostate cancer (HCC) 03/2021   s/p radiation therapy   PTSD (post-traumatic stress disorder)    Social History   Socioeconomic History   Marital status: Married    Spouse name: Not on file   Number of children: Not on file   Years of education: Not on file   Highest education level: Not on file  Occupational History   Occupation: retired  Tobacco Use  Smoking status: Former    Current packs/day: 1.00    Average packs/day: 1 pack/day for 15.0 years (15.0 ttl pk-yrs)    Types: Cigarettes   Smokeless tobacco: Never  Substance and Sexual Activity   Alcohol use: Not Currently    Comment: h/o heavy use   Drug use: Not Currently    Types: Marijuana, Cocaine    Comment: remote   Sexual activity: Not on file  Other Topics Concern   Not on file  Social History Narrative   Not on file   Social Determinants of Health   Financial Resource Strain: Not on file  Food Insecurity: No Food Insecurity (09/29/2023)   Hunger Vital Sign    Worried About Running Out of Food in the Last Year: Never true    Ran Out of Food in the Last Year: Never true  Transportation Needs: No Transportation Needs (09/29/2023)   PRAPARE -  Administrator, Civil Service (Medical): No    Lack of Transportation (Non-Medical): No  Physical Activity: Not on file  Stress: Not on file  Social Connections: Not on file   Family History  Problem Relation Age of Onset   Cancer Neg Hx    Scheduled Meds:  atorvastatin  10 mg Oral QHS   docusate sodium  100 mg Oral BID   metoprolol tartrate  12.5 mg Oral BID   mirtazapine  30 mg Oral QHS   multivitamin with minerals  1 tablet Oral Daily   pantoprazole  40 mg Oral Daily   predniSONE  40 mg Oral Q breakfast   senna  1 tablet Oral BID   sodium chloride flush  3 mL Intravenous Q12H   Continuous Infusions: PRN Meds:.acetaminophen **OR** acetaminophen, bisacodyl, HYDROcodone-acetaminophen, ondansetron **OR** ondansetron (ZOFRAN) IV, polyethylene glycol, sodium chloride flush Allergies  Allergen Reactions   Sildenafil Other (See Comments)    Unknown reaction - reported by [facility]   Review of Systems  Constitutional:  Negative for activity change, appetite change and fatigue.  Respiratory:  Negative for shortness of breath.   Cardiovascular:  Positive for leg swelling.    Physical Exam Nursing note reviewed. Exam conducted with a chaperone present.  Constitutional:      General: He is not in acute distress.    Appearance: Normal appearance.  Cardiovascular:     Rate and Rhythm: Bradycardia present.     Comments: Edema to groin/testicles Pulmonary:     Effort: No tachypnea, accessory muscle usage or respiratory distress.  Abdominal:     Palpations: Abdomen is soft.  Musculoskeletal:     Right lower leg: Edema present.     Left lower leg: Edema present.  Neurological:     Mental Status: He is alert and oriented to person, place, and time.     Vital Signs: BP 109/76 (BP Location: Left Arm)   Pulse (!) 56   Temp (!) 97.5 F (36.4 C) (Oral)   Resp 18   Ht 6\' 4"  (1.93 m)   Wt 104.7 kg   SpO2 100%   BMI 28.10 kg/m  Pain Scale: 0-10 POSS *See Group  Information*: 1-Acceptable,Awake and alert Pain Score: 0-No pain   SpO2: SpO2: 100 % O2 Device:SpO2: 100 % O2 Flow Rate: .   IO: Intake/output summary:  Intake/Output Summary (Last 24 hours) at 10/03/2023 0750 Last data filed at 10/03/2023 0024 Gross per 24 hour  Intake 480 ml  Output 938 ml  Net -458 ml    LBM: Last BM Date :  09/30/23 Baseline Weight: Weight: 104.3 kg Most recent weight: Weight: 104.7 kg     Palliative Assessment/Data:    Time Total: 80 min  Greater than 50%  of this time was spent counseling and coordinating care related to the above assessment and plan.  Signed by: Yong Channel, NP Palliative Medicine Team Pager # [phone] (M-F 8a-5p) Team Phone # [phone] (Nights/Weekends)

## 2023-10-04 DIAGNOSIS — Z7189 Other specified counseling: Secondary | ICD-10-CM | POA: Diagnosis not present

## 2023-10-04 DIAGNOSIS — C8318 Mantle cell lymphoma, lymph nodes of multiple sites: Secondary | ICD-10-CM | POA: Diagnosis not present

## 2023-10-04 DIAGNOSIS — N179 Acute kidney failure, unspecified: Secondary | ICD-10-CM | POA: Diagnosis not present

## 2023-10-04 DIAGNOSIS — Z515 Encounter for palliative care: Secondary | ICD-10-CM | POA: Diagnosis not present

## 2023-10-04 LAB — CBC
HCT: 28 % — ABNORMAL LOW (ref 39.0–52.0)
Hemoglobin: 8.7 g/dL — ABNORMAL LOW (ref 13.0–17.0)
MCH: 28 pg (ref 26.0–34.0)
MCHC: 31.1 g/dL (ref 30.0–36.0)
MCV: 90 fL (ref 80.0–100.0)
Platelets: 157 10*3/uL (ref 150–400)
RBC: 3.11 MIL/uL — ABNORMAL LOW (ref 4.22–5.81)
RDW: 17.3 % — ABNORMAL HIGH (ref 11.5–15.5)
WBC: 4.4 10*3/uL (ref 4.0–10.5)
nRBC: 0 % (ref 0.0–0.2)

## 2023-10-04 LAB — BASIC METABOLIC PANEL
Anion gap: 9 (ref 5–15)
BUN: 69 mg/dL — ABNORMAL HIGH (ref 8–23)
CO2: 20 mmol/L — ABNORMAL LOW (ref 22–32)
Calcium: 9.4 mg/dL (ref 8.9–10.3)
Chloride: 108 mmol/L (ref 98–111)
Creatinine, Ser: 2.9 mg/dL — ABNORMAL HIGH (ref 0.61–1.24)
GFR, Estimated: 22 mL/min — ABNORMAL LOW (ref >60)
Glucose, Bld: 91 mg/dL (ref 70–99)
Potassium: 3.9 mmol/L (ref 3.5–5.1)
Sodium: 137 mmol/L (ref 135–145)

## 2023-10-04 NOTE — Progress Notes (Addendum)
Palliative:  HPI:  77 y.o. male  with past medical history of mantle cell lymphoma, CKD stage 3a, HFpEF, afib, HLD admitted on 09/28/2023 with leg swelling and groin swelling with shortness of breath. Found to have progressing lymphadenopathy with compression and bilateral hydronephrosis and plans for bilateral ureteral stent placement. Following with Dr. Candise Che for further cancer treatment options.   I met again today with Jerry Tran. No family/visitors at bedside. We had further conversation about his health and especially in relationship to his faith and religion. He shares about his study of the Bible and what he has learned. He talks about his pastor's recent sermon and speaking life and not death. Sermon also spoke to having faith so that we can be healed. Jerry Tran continues to have faith that he will be healed.   Jerry Tran is looking forward to having stents placed tomorrow and hoping to return home shortly. He has plans to follow up with Dr. Candise Che next week to continue cancer treatment.   All questions/concerns addressed. Emotional support provided.   Exam: Alert, oriented. Good spirits. No distress. Breathing regular, unlabored. Abd soft. Edema to BLE and groin.  Plan: - Full code, full scope - Continue treatment under the direction of Dr. Candise Che  35 min  Yong Channel, NP Palliative Medicine Team Pager [phone] (Please see [email or web address] for schedule) Team Phone [phone]    Greater than 50%  of this time was spent counseling and coordinating care related to the above assessment and plan

## 2023-10-04 NOTE — Plan of Care (Signed)
  Problem: Education: Goal: Knowledge of General Education information will improve Description: Including pain rating scale, medication(s)/side effects and non-pharmacologic comfort measures Outcome: Progressing   Problem: Health Behavior/Discharge Planning: Goal: Ability to manage health-related needs will improve Outcome: Progressing   Problem: Activity: Goal: Risk for activity intolerance will decrease Outcome: Progressing   Problem: Nutrition: Goal: Adequate nutrition will be maintained Outcome: Progressing   Problem: Pain Management: Goal: General experience of comfort will improve Outcome: Progressing

## 2023-10-04 NOTE — Plan of Care (Addendum)
Patient AO X 4, VSS, no complaints of pain this shift. Plan is for bilateral ureteral stent placement on 12/6, NPO at midnight. Patient SR on tele, ambulates with assistance in room.    Problem: Education: Goal: Knowledge of General Education information will improve Description: Including pain rating scale, medication(s)/side effects and non-pharmacologic comfort measures Outcome: Progressing   Problem: Health Behavior/Discharge Planning: Goal: Ability to manage health-related needs will improve Outcome: Progressing   Problem: Clinical Measurements: Goal: Ability to maintain clinical measurements within normal limits will improve Outcome: Progressing Goal: Will remain free from infection Outcome: Progressing Goal: Diagnostic test results will improve Outcome: Progressing Goal: Respiratory complications will improve Outcome: Progressing Goal: Cardiovascular complication will be avoided Outcome: Progressing   Problem: Activity: Goal: Risk for activity intolerance will decrease Outcome: Progressing   Problem: Nutrition: Goal: Adequate nutrition will be maintained Outcome: Progressing   Problem: Coping: Goal: Level of anxiety will decrease Outcome: Progressing   Problem: Elimination: Goal: Will not experience complications related to bowel motility Outcome: Progressing Goal: Will not experience complications related to urinary retention Outcome: Progressing   Problem: Pain Management: Goal: General experience of comfort will improve Outcome: Progressing   Problem: Safety: Goal: Ability to remain free from injury will improve Outcome: Progressing   Problem: Skin Integrity: Goal: Risk for impaired skin integrity will decrease Outcome: Progressing

## 2023-10-04 NOTE — Progress Notes (Signed)
PROGRESS NOTE Jerry Tran  NGE:[id number] DOB: 1946-03-17 DOA: 09/28/2023 PCP: [name]  Brief Narrative/Hospital Course: 77 y.o. male with medical history significant of mantle cell lymphoma, CKD, diastolic CHF, A-fib, HLD presented with edema, leg swelling and groin swelling for the past 2 weeks and worsening shortness of breath He was seen by oncology recommended him to have CT abdomen and pelvis and also seen by urology who felt that he needs a Foley catheter placement, his penis and testicles has been continuously increased in swelling for the past 2 weeks but he is not having any pain In the ED: Vitals stable afebrile, labs showed BUN/creatinine at 271/2.7- recently on 11/8 was 36/1.3 egfr 54 CBC with similar pancytopenia. CT scan without>>showed progressive pelvic adenopathy (extensive progressive retroperitoneal and pelvic adenopathy interval new mild bilateral hydroureteronephrosis probably due to compressive effect, increasing bladder wall thickening, constipation, diverticulosis and worsening body wall anasarca, severe penoscrotal edema) Recent PET scan 10//24 with progressive bulky lymphadenopathy suspected lymphomatous involvement in the b/l upper arms Chest x-ray in the ED unremarkable. Patient was admitted for further management, oncology was consulted. With oral steroid patient's swelling slowly improving, creatinine continues to down trend.  Oncology at this time is planning for outpatient chemotherapy as patient has opted to go ahead with it.  Seen by urology given patient's obstructive nephropathy discussed about bilateral ureteral stent placement which is being planned for  10/05/23    Subjective: Patient seen and examined this morning. 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 Seen by Dr. Candise Che and at this time planning for outpatient chemotherapy soon in the coming week. Previously had declined option for CAR-T cell therapy. Cont 40 mg prednisone daily until outpatient follow-up with oncology Recent Labs  Lab 10/02/23 0417 10/03/23 0409 10/04/23 0331  HGB 8.7* 8.4* 8.7*  HCT 28.5* 26.8* 28.0*  WBC 3.4* 3.8* 4.4  PLT 147* 138* 157   AKI on CKD stage IIIa Obstructive uropathy Bilateral hydroureteronephrosis probably due to compression from lymphadenopathy: Patient voiding but creatinine now trending up his baseline is around 1.3.urology input appreciated planning for bilateral ureteric stent 12/6 .  Monitor renal function closely encourage oral hydration.  Avoid nephrotoxic medication Recent Labs    08/24/23 1020 09/07/23 1429 09/26/23 1529 09/28/23 2038 09/29/23 0845 09/30/23 0351 10/01/23 0359 10/02/23 0417 10/03/23 0409 10/04/23 0331  BUN 47* 36* 71* 87* 82* 76* 70* 63* 65* 69*  CREATININE 1.29* 1.36* 2.78* 3.08* 2.99* 2.64* 2.55* 2.15* 2.34* 2.90*  CO2 27 22 20* 20* 20* 20* 20* 18* 19* 20*  K 4.4 4.0 4.1 4.3 4.2 3.9 4.3 3.6 3.9 3.9    HLD: On Lipitor  Chronic diastolic CHF HTN: Euvolemic.  BP is stable on metoprolol. last echo with EF 60 to 65% from 10/25/2022  OSA:  no longer  on CPAP  Atrial fibrillation: Not on anticoagulation, cont metoprolol  Goals of care: Full CODE STATUS, palliative care has been consulted. Plan is to proceed with further chemotherapy.  DVT prophylaxis: SCDs Start: 09/29/23 0816 Code Status:   Code Status: Full Code Family Communication: plan of care discussed with patient at bedside. Patient status is: Inpatient because of lymphedema Mantle cell  Level of care: Telemetry   Dispo: The patient is from: Home            Anticipated disposition: Anticipate discharge post procedure either Friday or Saturday  Objective: Vitals last 24 hrs: Vitals:   10/03/23 0533 10/03/23 1256 10/03/23 2016 10/04/23 0507  BP: 109/76 110/76 122/79 92/69  Pulse: (!) 56 69 70 63  Resp: 18 16 18 18   Temp: (!) 97.5 F (36.4 C) 99.1 F (37.3 C) 98.1 F (36.7 C) 97.8 F (36.6 C)  TempSrc: Oral Oral Oral Oral  SpO2: 100% 100% 98% 97%  Weight:      Height:       Weight change:   Physical Examination: General exam: alert awake, oriented x3, pleasant HEENT:Oral mucosa moist, Ear/Nose WNL grossly Respiratory system: Bilaterally clear BS,no use of accessory muscle Cardiovascular system: S1 & S2 +, No JVD. Gastrointestinal system: Abdomen soft,NT,ND, BS+ Nervous System: Alert, awake, moving all extremities,and following commands. Extremities: LE edema neg,distal peripheral pulses palpable and warm.  Skin: No rashes,no icterus.  Lymph node enlargement is present at various sites subcutaneously MSK: Normal muscle bulk,tone, power   Medications reviewed:  Scheduled Meds:  atorvastatin  10 mg Oral QHS   Chlorhexidine Gluconate Cloth  6 each Topical Daily   docusate sodium  100 mg Oral BID   metoprolol tartrate  12.5 mg Oral BID   mirtazapine  30 mg Oral QHS   multivitamin with minerals  1 tablet Oral Daily   pantoprazole  40 mg Oral Daily   predniSONE  40 mg Oral Q breakfast   senna  1 tablet Oral BID   sodium chloride flush  3 mL Intravenous Q12H   Continuous Infusions:   Diet Order             Diet NPO time specified  Diet effective midnight           Diet NPO time specified  Diet effective midnight           Diet 2 gram sodium Room service appropriate? Yes; Fluid consistency: Thin  Diet effective now                    Intake/Output Summary (Last 24 hours) at 10/04/2023 0902 Last data filed at 10/04/2023 0500 Gross per 24 hour  Intake 480 ml  Output 1070 ml  Net -590 ml   Net IO Since Admission: -2,078 mL [10/04/23 0902]  Wt Readings from Last 3 Encounters:  09/29/23 104.7 kg  09/26/23 102.7 kg  09/24/23 103.7 kg     Unresulted Labs (From admission, onward)     Start     Ordered   10/02/23 0000  CBC with Differential (Cancer Center Only)  STAT        10/02/23 1031   10/02/23 0000  CMP (Cancer Center only)  STAT        10/02/23 1031          Data Reviewed: I have personally reviewed following labs and imaging studies CBC: Recent Labs  Lab 09/28/23 2038 09/29/23 0845 09/30/23 0351 10/01/23 0359 10/02/23 0417  10/03/23 0409 10/04/23 0331  WBC 2.7*   < > 3.3* 3.6* 3.4* 3.8* 4.4  NEUTROABS 1.7  --   --   --   --   --   --   HGB 9.9*   < > 9.1* 9.4* 8.7* 8.4* 8.7*  HCT 30.6*   < > 29.3* 30.7* 28.5* 26.8* 28.0*  MCV 88.7   < > 89.1 91.6 92.2 91.2 90.0  PLT 106*   < > 135* 146* 147* 138* 157   < > = values in this interval not displayed.   Basic Metabolic Panel:  Recent Labs  Lab 09/29/23 0845 09/30/23 0351 10/01/23 0359 10/02/23 0417 10/03/23 0409 10/04/23 0331  NA 137 137 136 135 138 137  K 4.2 3.9 4.3 3.6 3.9 3.9  CL 107 108 107 109 111 108  CO2 20* 20* 20* 18* 19* 20*  GLUCOSE 91 105* 94 109* 86 91  BUN 82* 76* 70* 63* 65* 69*  CREATININE 2.99* 2.64* 2.55* 2.15* 2.34* 2.90*  CALCIUM 9.1 9.3 9.5 9.0 9.2 9.4  MG 2.7*  --   --   --   --   --   PHOS 4.7*  --   --   --   --   --    GFR: Estimated Creatinine Clearance: 28.4 mL/min (A) (by C-G formula based on SCr of 2.9 mg/dL (H)). Liver  Function Tests:  Recent Labs  Lab 09/28/23 2035 09/29/23 0845  AST 39 35  ALT 15 14  ALKPHOS 63 57  BILITOT 0.4 0.6  PROT 6.8 6.6  ALBUMIN 3.7 3.6   Recent Labs  Lab 09/28/23 2035  INR 1.1  No results for input(s): "CHOL", "HDL", "LDLCALC", "TRIG", "CHOLHDL", "LDLDIRECT" in the last 72 hours. No results for input(s): "TSH", "T4TOTAL", "FREET4", "T3FREE", "THYROIDAB" in the last 72 hours.  No results found for this or any previous visit (from the past 240 hour(s)).  Antimicrobials: Anti-infectives (From admission, onward)    None      Culture/Microbiology none  Radiology Studies: No results found.   LOS: 6 days   Total time spent in review of labs and imaging, patient evaluation, formulation of plan, documentation and communication with family: 35 minutes  Lanae Boast, MD [facility]  10/04/2023, 9:02 AM

## 2023-10-05 ENCOUNTER — Other Ambulatory Visit: Payer: Self-pay

## 2023-10-05 ENCOUNTER — Encounter (HOSPITAL_COMMUNITY): Payer: Self-pay | Admitting: Internal Medicine

## 2023-10-05 ENCOUNTER — Inpatient Hospital Stay: Payer: Self-pay

## 2023-10-05 ENCOUNTER — Inpatient Hospital Stay (HOSPITAL_COMMUNITY): Payer: No Typology Code available for payment source

## 2023-10-05 ENCOUNTER — Inpatient Hospital Stay (HOSPITAL_COMMUNITY): Payer: No Typology Code available for payment source | Admitting: Anesthesiology

## 2023-10-05 ENCOUNTER — Encounter (HOSPITAL_COMMUNITY)
Admission: EM | Disposition: A | Discharge status: Skilled Nursing Facility | Payer: Self-pay | Source: Home / Self Care | Attending: Family Medicine

## 2023-10-05 DIAGNOSIS — N1330 Unspecified hydronephrosis: Secondary | ICD-10-CM

## 2023-10-05 DIAGNOSIS — N179 Acute kidney failure, unspecified: Secondary | ICD-10-CM | POA: Diagnosis not present

## 2023-10-05 HISTORY — PX: CYSTOSCOPY W/ RETROGRADES: SHX1426

## 2023-10-05 LAB — CBC
HCT: 26.9 % — ABNORMAL LOW (ref 39.0–52.0)
Hemoglobin: 8.4 g/dL — ABNORMAL LOW (ref 13.0–17.0)
MCH: 28.2 pg (ref 26.0–34.0)
MCHC: 31.2 g/dL (ref 30.0–36.0)
MCV: 90.3 fL (ref 80.0–100.0)
Platelets: 132 10*3/uL — ABNORMAL LOW (ref 150–400)
RBC: 2.98 MIL/uL — ABNORMAL LOW (ref 4.22–5.81)
RDW: 17.3 % — ABNORMAL HIGH (ref 11.5–15.5)
WBC: 3.8 10*3/uL — ABNORMAL LOW (ref 4.0–10.5)
nRBC: 0 % (ref 0.0–0.2)

## 2023-10-05 LAB — BASIC METABOLIC PANEL
Anion gap: 9 (ref 5–15)
BUN: 81 mg/dL — ABNORMAL HIGH (ref 8–23)
CO2: 20 mmol/L — ABNORMAL LOW (ref 22–32)
Calcium: 9.4 mg/dL (ref 8.9–10.3)
Chloride: 106 mmol/L (ref 98–111)
Creatinine, Ser: 3.92 mg/dL — ABNORMAL HIGH (ref 0.61–1.24)
GFR, Estimated: 15 mL/min — ABNORMAL LOW (ref >60)
Glucose, Bld: 91 mg/dL (ref 70–99)
Potassium: 4.2 mmol/L (ref 3.5–5.1)
Sodium: 135 mmol/L (ref 135–145)

## 2023-10-05 SURGERY — CYSTOSCOPY, WITH RETROGRADE PYELOGRAM
Anesthesia: General | Site: Ureter | Laterality: Bilateral

## 2023-10-05 MED ORDER — PROPOFOL 10 MG/ML IV BOLUS
INTRAVENOUS | Status: DC | PRN
  Administered 2023-10-05: 150 mg via INTRAVENOUS

## 2023-10-05 MED ORDER — HYOSCYAMINE SULFATE 0.125 MG PO TBDP
0.1250 mg | ORAL_TABLET | Freq: Four times a day (QID) | ORAL | Status: DC | PRN
  Administered 2023-10-07: 0.125 mg via SUBLINGUAL
  Filled 2023-10-05 (×2): qty 1

## 2023-10-05 MED ORDER — PHENYLEPHRINE HCL (PRESSORS) 10 MG/ML IV SOLN
INTRAVENOUS | Status: DC | PRN
  Administered 2023-10-05: 80 ug via INTRAVENOUS
  Administered 2023-10-05: 160 ug via INTRAVENOUS
  Administered 2023-10-05: 80 ug via INTRAVENOUS

## 2023-10-05 MED ORDER — LACTATED RINGERS IV SOLN: INTRAVENOUS | Status: DC | PRN

## 2023-10-05 MED ORDER — FENTANYL CITRATE PF 50 MCG/ML IJ SOSY
PREFILLED_SYRINGE | INTRAMUSCULAR | Status: AC
  Administered 2023-10-05: 50 ug via INTRAVENOUS
  Filled 2023-10-05: qty 2

## 2023-10-05 MED ORDER — ORAL CARE MOUTH RINSE: 15.0000 mL | Freq: Once | OROMUCOSAL | Status: AC

## 2023-10-05 MED ORDER — HYOSCYAMINE SULFATE 0.125 MG PO TBDP: 0.1250 mg | ORAL_TABLET | Freq: Four times a day (QID) | ORAL | Status: DC | PRN

## 2023-10-05 MED ORDER — DROPERIDOL 2.5 MG/ML IJ SOLN: 0.6250 mg | Freq: Once | INTRAMUSCULAR | Status: DC | PRN

## 2023-10-05 MED ORDER — FENTANYL CITRATE (PF) 100 MCG/2ML IJ SOLN
INTRAMUSCULAR | Status: DC | PRN
  Administered 2023-10-05 (×2): 25 ug via INTRAVENOUS

## 2023-10-05 MED ORDER — FENTANYL CITRATE (PF) 100 MCG/2ML IJ SOLN
INTRAMUSCULAR | Status: AC
  Filled 2023-10-05: qty 2

## 2023-10-05 MED ORDER — CEFAZOLIN SODIUM-DEXTROSE 2-4 GM/100ML-% IV SOLN
2.0000 g | INTRAVENOUS | Status: AC
  Administered 2023-10-05: 2 g via INTRAVENOUS
  Filled 2023-10-05: qty 100

## 2023-10-05 MED ORDER — OXYBUTYNIN CHLORIDE ER 5 MG PO TB24
10.0000 mg | ORAL_TABLET | Freq: Every day | ORAL | Status: DC
  Administered 2023-10-05 – 2023-10-21 (×17): 10 mg via ORAL
  Filled 2023-10-05 (×6): qty 2
  Filled 2023-10-05: qty 1
  Filled 2023-10-05 (×10): qty 2

## 2023-10-05 MED ORDER — HYOSCYAMINE SULFATE 0.125 MG SL SUBL
SUBLINGUAL_TABLET | SUBLINGUAL | Status: AC
  Administered 2023-10-05: 0.125 mg via SUBLINGUAL
  Filled 2023-10-05: qty 1

## 2023-10-05 MED ORDER — CHLORHEXIDINE GLUCONATE 0.12 % MT SOLN
15.0000 mL | Freq: Once | OROMUCOSAL | Status: AC
  Administered 2023-10-05: 15 mL via OROMUCOSAL

## 2023-10-05 MED ORDER — LACTATED RINGERS IV SOLN: INTRAVENOUS | Status: DC

## 2023-10-05 MED ORDER — LIDOCAINE HCL (CARDIAC) PF 100 MG/5ML IV SOSY
PREFILLED_SYRINGE | INTRAVENOUS | Status: DC | PRN
  Administered 2023-10-05: 80 mg via INTRAVENOUS

## 2023-10-05 MED ORDER — PROPOFOL 10 MG/ML IV BOLUS
INTRAVENOUS | Status: AC
  Filled 2023-10-05: qty 20

## 2023-10-05 MED ORDER — STERILE WATER FOR IRRIGATION IR SOLN
Status: DC | PRN
Start: 2023-10-05 — End: 2023-10-05
  Administered 2023-10-05: 1000 mL

## 2023-10-05 MED ORDER — FENTANYL CITRATE PF 50 MCG/ML IJ SOSY: 25.0000 ug | PREFILLED_SYRINGE | INTRAMUSCULAR | Status: DC | PRN

## 2023-10-05 SURGICAL SUPPLY — 11 items
BAG URO CATCHER STRL LF (MISCELLANEOUS) ×1 IMPLANT
CATH URETL OPEN END 6FR 70 (CATHETERS) ×1 IMPLANT
CLOTH BEACON ORANGE TIMEOUT ST (SAFETY) ×1 IMPLANT
GLOVE SURG LX STRL 7.5 STRW (GLOVE) ×1 IMPLANT
GUIDEWIRE STR DUAL SENSOR (WIRE) IMPLANT
KIT TURNOVER KIT A (KITS) IMPLANT
NS IRRIG 1000ML POUR BTL (IV SOLUTION) IMPLANT
PACK CYSTO (CUSTOM PROCEDURE TRAY) ×1 IMPLANT
STENT URET 6FRX26 CONTOUR (STENTS) IMPLANT
STENT URET 6FRX28 CONTOUR (STENTS) IMPLANT
TUBING CONNECTING 10 (TUBING) ×1 IMPLANT

## 2023-10-05 NOTE — Transfer of Care (Signed)
Immediate Anesthesia Transfer of Care Note  Patient: Sunday Spillers  Procedure(s) Performed: CYSTOSCOPY WITH BILATERAL RETROGRADE PYELOGRAM and BILATERAL STENT PLACEMENT (Bilateral: Ureter)  Patient Location: PACU  Anesthesia Type:General  Level of Consciousness: awake and alert   Airway & Oxygen Therapy: Patient Spontanous Breathing and Patient connected to nasal cannula oxygen  Post-op Assessment: Report given to RN and Post -op Vital signs reviewed and stable  Post vital signs: Reviewed and stable  Last Vitals:  Vitals Value Taken Time  BP    Temp    Pulse 72 10/05/23 1558  Resp 14 10/05/23 1558  SpO2 99 % 10/05/23 1558  Vitals shown include unfiled device data.  Last Pain:  Vitals:   10/05/23 1401  TempSrc: Oral  PainSc:          Complications: No notable events documented.

## 2023-10-05 NOTE — Progress Notes (Signed)
HOSPITALIST ROUNDING NOTE Jerry Tran WJX:[id number]  DOB: 08-Sep-1946  DOA: 09/28/2023  PCP: [facility]  10/05/2023,1:05 PM   LOS: 7 days      Code Status: full  From:  home    Current Dispo: unclear     75 bm known PTSD Prostate ca + XRT--Mantel cell lymphoma diagnosed 11/2021 admission during hopsitalization--- follows currently with Dr. Kale/Dr. Basilio Cairo and has had XRT Underlying HFpEF  60-65% echo 10/25/22 DD on echo   Previous H. pylori infection 10/2021 Paroxysmal A-fib HTN HLD He has some subacute retention and has refused in the past indwelling Foley  He developed relapsed mantle cell lymphoma was seen in the ED 09/20/2023 for increasing lymphedema in the setting of increased fluctuance he of lymph nodes in the groin etc.--- he is Protunitib with no toxicity--- second opinion from oncologist Dr. Adin Hector underway for car T-cell therapies?  Bite trial  Initial presentation 11/29 with progressing shortness of breath leg groin swelling in the setting of severe AKI with BUN/creatinine >120 and 3 CT scan progressive adenopathy new bilateral hydroureteronephrosis secondary to compressive effect increased bladder thickening as well as anasarca Patient was admitted oncology urology consulted 12/6 Ureteric stent placed for extrinsic compression  Plan  Aki baseline creat 1.3 + obstructive uropathy--LAN causing hydroureter Severe on admit Voiding fairly now, but creatinine worsening--going for Ureteric stent.  Hopeful for aki recovery--appreciate Urology input--  Worsening LAN/anasarca- Hfpef Afib not on AC--chad2vasc2 > 4 Continue metoprolol 12,5 bid  OSA no longer on CPAP  Mantle cell lymphoma--metastatic--plan for chem soon? Defer to Dr. Brayton Mars soon--continues on prednisone 40--recent XRT which helped resolve some of the swellings he has had Unclear further XRT to renal area may help with extrinsic compression-if no improvement may discuss with XRT  physician   DVT prophylaxis: SCD  Status is: Inpatient Remains inpatient appropriate because:   Requires durable improvement of renal function  Subjective: Pleasant swollen lower extremities and groin-somewhat uncomfortable in groin-mention to truss to him No chest pain no fever no nausea no vomit   Objective + exam Vitals:   10/04/23 0507 10/04/23 1941 10/05/23 0430 10/05/23 1254  BP: 92/69 105/70 101/74 113/75  Pulse: 63 70 71 78  Resp: 18 20 20 20   Temp: 97.8 F (36.6 C) 98.1 F (36.7 C) 97.7 F (36.5 C) 98.3 F (36.8 C)  TempSrc: Oral Oral Oral Oral  SpO2: 97% 100% 100% 100%  Weight:      Height:       Filed Weights   09/28/23 2012 09/29/23 1312  Weight: 104.3 kg 104.7 kg    Examination:  EOMI NCAT pleasant coherent no distress Chest clear S1-S2 no murmur Abdomen soft no rebound Massive lower extremity edema/anasarca with scrotal edema pitting grade 4  Data Reviewed: reviewed   CBC    Component Value Date/Time   WBC 3.8 (L) 10/05/2023 0304   RBC 2.98 (L) 10/05/2023 0304   HGB 8.4 (L) 10/05/2023 0304   HGB 9.5 (L) 09/26/2023 1529   HCT 26.9 (L) 10/05/2023 0304   PLT 132 (L) 10/05/2023 0304   PLT 114 (L) 09/26/2023 1529   MCV 90.3 10/05/2023 0304   MCH 28.2 10/05/2023 0304   MCHC 31.2 10/05/2023 0304   RDW 17.3 (H) 10/05/2023 0304   LYMPHSABS 0.5 (L) 09/28/2023 2038   MONOABS 0.4 09/28/2023 2038   EOSABS 0.0 09/28/2023 2038   BASOSABS 0.0 09/28/2023 2038      Latest Ref Rng & Units 10/05/2023  3:04 AM 10/04/2023    3:31 AM 10/03/2023    4:09 AM  CMP  Glucose 70 - 99 mg/dL 91  91  86   BUN 8 - 23 mg/dL 81  69  65   Creatinine 0.61 - 1.24 mg/dL 5.40  9.81  1.91   Sodium 135 - 145 mmol/L 135  137  138   Potassium 3.5 - 5.1 mmol/L 4.2  3.9  3.9   Chloride 98 - 111 mmol/L 106  108  111   CO2 22 - 32 mmol/L 20  20  19    Calcium 8.9 - 10.3 mg/dL 9.4  9.4  9.2      Scheduled Meds:  atorvastatin  10 mg Oral QHS   Chlorhexidine Gluconate  Cloth  6 each Topical Daily   docusate sodium  100 mg Oral BID   metoprolol tartrate  12.5 mg Oral BID   mirtazapine  30 mg Oral QHS   multivitamin with minerals  1 tablet Oral Daily   pantoprazole  40 mg Oral Daily   predniSONE  40 mg Oral Q breakfast   senna  1 tablet Oral BID   sodium chloride flush  3 mL Intravenous Q12H   Continuous Infusions:   ceFAZolin (ANCEF) IV      Time  44  Rhetta Mura, MD  [facility]

## 2023-10-05 NOTE — Anesthesia Procedure Notes (Signed)
Procedure Name: LMA Insertion Date/Time: 10/05/2023 3:00 PM  Performed by: Deri Fuelling, CRNAPre-anesthesia Checklist: Patient identified, Emergency Drugs available, Suction available and Patient being monitored Patient Re-evaluated:Patient Re-evaluated prior to induction Oxygen Delivery Method: Circle system utilized Preoxygenation: Pre-oxygenation with 100% oxygen Induction Type: IV induction Ventilation: Mask ventilation without difficulty LMA: LMA inserted LMA Size: 5.0 Tube type: Oral Number of attempts: 1 Airway Equipment and Method: Stylet and Oral airway Placement Confirmation: ETT inserted through vocal cords under direct vision, positive ETCO2 and breath sounds checked- equal and bilateral Tube secured with: Tape Dental Injury: Teeth and Oropharynx as per pre-operative assessment

## 2023-10-05 NOTE — Op Note (Signed)
Operative Note  Preoperative diagnosis:  1.  Bilateral hydronephrosis secondary to extrinsic compression  Post operative diagnosis: 1.  Bilateral hydronephrosis secondary to extrinsic compression  Procedure(s): 1.  Cystoscopy with bilateral retrograde pyelogram and bilateral ureteral stent placement  Surgeon: Modena Slater, MD  Assistants: None  Anesthesia: General  Complications: None immediate  EBL: Minimal  Specimens: 1.  None  Drains/Catheters: 1.  Right 6 X 28 double-J ureteral stent. 2.  Left 6 x 26 double-J ureteral stent 3.  18 French council tip catheter  Intraoperative findings: 1.  Patient had significant penile edema.  Was difficult to locate the glans but ultimately able to pass the cystoscope into the urethra and into the bladder.  He is uncircumcised. 2.  Normal urethra and bladder mucosa   3.  Right retrograde pyelogram revealed a ureteral narrowing at the level of the extrinsic compression with upstream hydroureteronephrosis.  Left retrograde pyelogram also showed ureteral narrowing at the level of the extrinsic compression with upstream hydronephrosis.  Indication: 77 year old male with mantle cell lymphoma has significant pelvic lymphadenopathy.  Presented with progressive edema but also renal insufficiency.  Creatinine initially improved but subsequently worsened and he has mild bilateral hydronephrosis secondary to extrinsic compression from the lymphadenopathy.  He presents for bilateral ureteral stent placement.  Description of procedure:  The patient was identified and consent was obtained.  The patient was taken to the operating room and placed in the supine position.  The patient was placed under general anesthesia.  Perioperative antibiotics were administered.  The patient was placed in dorsal lithotomy.  Patient was prepped and draped in a standard sterile fashion and a timeout was performed.  A 21 French rigid cystoscope was advanced into the  edematous foreskin and I was ultimately able to navigate the scope into the urethra and into the bladder.  The right distal most portion of the ureter was cannulated with an open-ended ureteral catheter.  Retrograde pyelogram was performed with the findings noted above.  A sensor wire was then advanced up to the kidney under fluoroscopic guidance.  A 6 X 28 double-J ureteral stent was advanced up to the kidney under fluoroscopic guidance.  The wire was withdrawn and fluoroscopy confirmed good proximal placement and direct visualization confirmed a good coil within the bladder.    I then intubated the left ureteral orifice with an open-ended ureteral catheter and shot a retrograde pyelogram with findings noted above.  I then advanced a wire up the ureter and into the kidney followed by routine placement of a 6 x 26 double-J ureteral stent.  Fluoroscopy confirmed proximal placement and direct visualization confirmed a good coil within the bladder.  I readvanced the wire into the bladder and withdrew the scope and placed an 18 [place] council tip catheter over the wire.    This concluded the operation.  Patient tolerated procedure well and was stable postoperatively.  Plan: Continue to monitor creatinine.  He should keep his Foley catheter in place over the weekend with a voiding trial on Monday as long as his creatinine improves.  If creatinine fails to improve, repeat a renal ultrasound to ensure resolution of hydronephrosis.

## 2023-10-05 NOTE — Progress Notes (Signed)
Patient wife had question concerning patient chemo medication Jaypirca (pirtobrutinib)100mg  2 tabs daily and if he should be taking it. She states that he has been off of it for one week.

## 2023-10-05 NOTE — Anesthesia Preprocedure Evaluation (Signed)
Anesthesia Evaluation  Patient identified by MRN, date of birth, ID band Patient awake    Reviewed: Allergy & Precautions, NPO status , Patient's Chart, lab work & pertinent test results  Airway Mallampati: II  TM Distance: >3 FB Neck ROM: Full    Dental  (+) Dental Advisory Given   Pulmonary former smoker   breath sounds clear to auscultation       Cardiovascular hypertension, Pt. on medications +CHF  + dysrhythmias Atrial Fibrillation  Rhythm:Regular Rate:Normal     Neuro/Psych negative neurological ROS     GI/Hepatic Neg liver ROS,GERD  ,,  Endo/Other    Renal/GU Renal Insufficiency, CRF and ARFRenal disease   Prostate CA    Musculoskeletal  (+) Arthritis ,    Abdominal   Peds  Hematology  (+) Blood dyscrasia, anemia Lymphoma   Anesthesia Other Findings   Reproductive/Obstetrics                             Anesthesia Physical Anesthesia Plan  ASA: 3  Anesthesia Plan: General   Post-op Pain Management:    Induction: Intravenous  PONV Risk Score and Plan: 2 and Dexamethasone, Ondansetron and Treatment may vary due to age or medical condition  Airway Management Planned: LMA  Additional Equipment:   Intra-op Plan:   Post-operative Plan: Extubation in OR  Informed Consent: I have reviewed the patients History and Physical, chart, labs and discussed the procedure including the risks, benefits and alternatives for the proposed anesthesia with the patient or authorized representative who has indicated his/her understanding and acceptance.     Dental advisory given  Plan Discussed with: CRNA  Anesthesia Plan Comments:        Anesthesia Quick Evaluation

## 2023-10-05 NOTE — Progress Notes (Signed)
At 0300, this RN assumed are for patient.

## 2023-10-05 NOTE — Interval H&P Note (Signed)
History and Physical Interval Note:  10/05/2023 2:32 PM  Jerry Tran  has presented today for surgery, with the diagnosis of BILATERAL HYDRONEPHROSIS.  The various methods of treatment have been discussed with the patient and family. After consideration of risks, benefits and other options for treatment, the patient has consented to  Procedure(s): CYSTOSCOPY WITH BILATERAL RETROGRADE PYELOGRAM (Bilateral) as a surgical intervention.  The patient's history has been reviewed, patient examined, no change in status, stable for surgery.  I have reviewed the patient's chart and labs.  Questions were answered to the patient's satisfaction.     Ray Church, III

## 2023-10-05 NOTE — Plan of Care (Addendum)
Patient s/p bilateral ureteral stent placement. Very chilled upon return to floor, cannot get him to warm up, though he is afebrile. Skin is clammy. Yellow MEWS, discussed with charge RN, patient may need bear hugger treatment, she will pass along to monitor next shift.   Problem: Education: Goal: Knowledge of General Education information will improve Description: Including pain rating scale, medication(s)/side effects and non-pharmacologic comfort measures Outcome: Progressing   Problem: Health Behavior/Discharge Planning: Goal: Ability to manage health-related needs will improve Outcome: Progressing   Problem: Clinical Measurements: Goal: Ability to maintain clinical measurements within normal limits will improve Outcome: Progressing Goal: Will remain free from infection Outcome: Progressing Goal: Diagnostic test results will improve Outcome: Progressing Goal: Respiratory complications will improve Outcome: Progressing Goal: Cardiovascular complication will be avoided Outcome: Progressing   Problem: Activity: Goal: Risk for activity intolerance will decrease Outcome: Progressing   Problem: Nutrition: Goal: Adequate nutrition will be maintained Outcome: Progressing   Problem: Coping: Goal: Level of anxiety will decrease Outcome: Progressing   Problem: Elimination: Goal: Will not experience complications related to bowel motility Outcome: Progressing Goal: Will not experience complications related to urinary retention Outcome: Progressing   Problem: Pain Management: Goal: General experience of comfort will improve Outcome: Progressing   Problem: Safety: Goal: Ability to remain free from injury will improve Outcome: Progressing   Problem: Skin Integrity: Goal: Risk for impaired skin integrity will decrease Outcome: Progressing

## 2023-10-06 ENCOUNTER — Inpatient Hospital Stay (HOSPITAL_COMMUNITY): Payer: No Typology Code available for payment source

## 2023-10-06 DIAGNOSIS — N179 Acute kidney failure, unspecified: Secondary | ICD-10-CM | POA: Diagnosis not present

## 2023-10-06 DIAGNOSIS — M7989 Other specified soft tissue disorders: Secondary | ICD-10-CM | POA: Diagnosis not present

## 2023-10-06 LAB — BASIC METABOLIC PANEL
Anion gap: 12 (ref 5–15)
BUN: 93 mg/dL — ABNORMAL HIGH (ref 8–23)
CO2: 15 mmol/L — ABNORMAL LOW (ref 22–32)
Calcium: 8.7 mg/dL — ABNORMAL LOW (ref 8.9–10.3)
Chloride: 104 mmol/L (ref 98–111)
Creatinine, Ser: 5.12 mg/dL — ABNORMAL HIGH (ref 0.61–1.24)
GFR, Estimated: 11 mL/min — ABNORMAL LOW (ref >60)
Glucose, Bld: 109 mg/dL — ABNORMAL HIGH (ref 70–99)
Potassium: 4.3 mmol/L (ref 3.5–5.1)
Sodium: 131 mmol/L — ABNORMAL LOW (ref 135–145)

## 2023-10-06 LAB — URINALYSIS, ROUTINE W REFLEX MICROSCOPIC: Bacteria, UA: NONE SEEN

## 2023-10-06 LAB — CBC
HCT: 25.3 % — ABNORMAL LOW (ref 39.0–52.0)
Hemoglobin: 8.1 g/dL — ABNORMAL LOW (ref 13.0–17.0)
MCH: 28.2 pg (ref 26.0–34.0)
MCHC: 32 g/dL (ref 30.0–36.0)
MCV: 88.2 fL (ref 80.0–100.0)
Platelets: 85 10*3/uL — ABNORMAL LOW (ref 150–400)
RBC: 2.87 MIL/uL — ABNORMAL LOW (ref 4.22–5.81)
RDW: 17.3 % — ABNORMAL HIGH (ref 11.5–15.5)
WBC: 6.8 10*3/uL (ref 4.0–10.5)
nRBC: 0 % (ref 0.0–0.2)

## 2023-10-06 LAB — CREATININE, URINE, RANDOM: Creatinine, Urine: 84 mg/dL

## 2023-10-06 LAB — SODIUM, URINE, RANDOM: Sodium, Ur: 42 mmol/L

## 2023-10-06 MED ORDER — STERILE WATER FOR INJECTION IV SOLN: INTRAVENOUS | Status: DC

## 2023-10-06 MED ORDER — SODIUM CHLORIDE 0.9 % IV SOLN
3.0000 g | Freq: Two times a day (BID) | INTRAVENOUS | Status: DC
  Administered 2023-10-06 – 2023-10-07 (×3): 3 g via INTRAVENOUS
  Filled 2023-10-06 (×3): qty 8

## 2023-10-06 MED ORDER — SODIUM BICARBONATE 8.4 % IV SOLN
INTRAVENOUS | Status: AC
  Filled 2023-10-06: qty 1000

## 2023-10-06 MED ORDER — SODIUM BICARBONATE 8.4 % IV SOLN
INTRAVENOUS | Status: DC
  Filled 2023-10-06: qty 1000
  Filled 2023-10-06: qty 150

## 2023-10-06 MED ORDER — HEPARIN BOLUS VIA INFUSION
4000.0000 [IU] | Freq: Once | INTRAVENOUS | Status: AC
  Administered 2023-10-06: 4000 [IU] via INTRAVENOUS
  Filled 2023-10-06: qty 4000

## 2023-10-06 MED ORDER — LACTATED RINGERS IV SOLN: INTRAVENOUS | Status: DC

## 2023-10-06 MED ORDER — HEPARIN (PORCINE) 25000 UT/250ML-% IV SOLN
1850.0000 [IU]/h | INTRAVENOUS | Status: DC
Start: 2023-10-06 — End: 2023-10-07
  Administered 2023-10-06: 1850 [IU]/h via INTRAVENOUS
  Filled 2023-10-06: qty 250

## 2023-10-06 MED ORDER — TECHNETIUM TO 99M ALBUMIN AGGREGATED
3.9500 | Freq: Once | INTRAVENOUS | Status: AC | PRN
Start: 2023-10-06 — End: 2023-10-06
  Administered 2023-10-06: 3.95 via INTRAVENOUS

## 2023-10-06 NOTE — Progress Notes (Signed)
Pharmacy Antibiotic Note  Jerry Tran is a 77 y.o. male admitted on 09/28/2023 with  aspiration PNA .  Pharmacy has been consulted for Unasyn dosing.  ID: Tmax 99.5, WBC 6.8, c/o SOB (2L [place] currently),  - Scr 5.12 rising  Plan: Start Unasyn 3g IV q12 hrs. Pharmacy will sign off. Please reconsult for further dosing assitance.    Height: 6\' 4"  (193 cm) Weight: 104.7 kg (230 lb 13.2 oz) IBW/kg (Calculated) : 86.8  Temp (24hrs), Avg:98.5 F (36.9 C), Min:98 F (36.7 C), Max:99.5 F (37.5 C)  Recent Labs  Lab 10/02/23 0417 10/03/23 0409 10/04/23 0331 10/05/23 0304 10/06/23 0338  WBC 3.4* 3.8* 4.4 3.8* 6.8  CREATININE 2.15* 2.34* 2.90* 3.92* 5.12*    Estimated Creatinine Clearance: 16.1 mL/min (A) (by C-G formula based on SCr of 5.12 mg/dL (H)).    Allergies  Allergen Reactions   Sildenafil Other (See Comments)    Unknown reaction - reported by [facility] S. Merilynn Finland, PharmD, BCPS Clinical Staff Pharmacist [email or web address]  Pasty Spillers 10/06/2023 9:48 AM

## 2023-10-06 NOTE — Progress Notes (Addendum)
HOSPITALIST ROUNDING NOTE Jerry Tran MWN:[id number]  DOB: Oct 18, 1946  DOA: 09/28/2023  PCP: [facility]  10/06/2023,9:45 AM   LOS: 8 days      Code Status: full  From:  home    Current Dispo: unclear     75 bm known PTSD Prostate ca + XRT--Mantel cell lymphoma diagnosed 11/2021 admission during hopsitalization--- follows currently with Dr. Kale/Dr. Basilio Cairo and has had XRT Underlying HFpEF  60-65% echo 10/25/22 DD on echo   Previous H. pylori infection 10/2021 Paroxysmal A-fib HTN HLD He has some subacute retention and has refused in the past indwelling Foley  He developed relapsed mantle cell lymphoma was seen in the ED 09/20/2023 for increasing lymphedema in the setting of increased fluctuance he of lymph nodes in the groin etc.--- he is Protunitib with no toxicity--- second opinion from oncologist Dr. Adin Hector underway for car T-cell therapies?  Bite trial  Initial presentation 11/29 with progressing shortness of breath leg groin swelling in the setting of severe AKI with BUN/creatinine >120 and 3 CT scan progressive adenopathy new bilateral hydroureteronephrosis secondary to compressive effect increased bladder thickening as well as anasarca Patient was admitted oncology urology consulted 12/6 Ureteric stent placed for extrinsic compression 12/7 early a.m. more tachypneic needing oxygen increased respiratory rate-urinary ultrasound mild hydronephrosis bilaterally Foley catheter in place  Plan  Shortness of breath tachypnea tachycardia ?  Aspiration pneumonia DDx PE-he is hypotensive additionally and I am concerned about sepsis Start Unasyn per pharmacy given crackles on exam-predominantly suspect pneumonia despite negative CXR--could be PE-obtain VQ Desat screen ordered take off oxygen and see how the patient does he still verbalizing well but looks more ill than he did yesterday  Worsening AKI in the setting of extrinsic compression causing mild hydronephrosis Ureteric  stent 12/6-discussed with urology-worsening creatinine portends poorly-ureteric stent may be causing some irritation he has some dark urine but there are no clots--- oxybutynin 10 at bedtime [place] repeat 12/7 as above--get urine sodium urine creatinine although this sounds very obstructive Would watch kidney function closely Given profound acidosis and almost doubling of creatinine we will start D5 bicarb and reevaluate Nephrology consulted  Mantle cell lymphoma metastatic was supposed to have second opinion with [facility] oncology Continues on prednisone 40--received R-GemOx 12/3--main side effect is heme toxicities like TCP etc Will involve oncologist to discuss goals of care if further worsening status --last seen by Dr. Candise Che 10/02/23  Paroxysmal A-fib CHADVASC >4 not on anticoagulation Cut back metoprolol 2/2 worsening creatinine and hypotension  Massive anasarca HFpEF This hopefully is just re-equilibration of  Patient is -1.9 L.today, wght same  CRITICAL CARE Performed by: Rhetta Mura   Total critical care time: 30 minutes  Critical care time was exclusive of separately billable procedures and treating other patients.  Critical care was necessary to treat or prevent imminent or life-threatening deterioration.  Critical care was time spent personally by me on the following activities: development of treatment plan with patient and/or surrogate as well as nursing, discussions with consultants, evaluation of patient's response to treatment, examination of patient, obtaining history from patient or surrogate, ordering and performing treatments and interventions, ordering and review of laboratory studies, ordering and review of radiographic studies, pulse oximetry and re-evaluation of patient's condition.   He is a full code palliative care has seen him last seen on 12/5 we are hopeful for recovery I will discuss with his wife implications if he does not improve his kidney  function we will get nephrology  involved  DVT prophylaxis: SCD  Status is: Inpatient Remains inpatient appropriate because:   Requires durable improvement of renal function  Subjective:  Events overnight noted short of breath uncomfortable looks more diaphoretic Felt a little dizzy this morning on getting up and needed assistance now feels a little better Has a Foley catheter and draining good urine although it is bloody without clots according to nursing No chest pain    Objective + exam Vitals:   10/05/23 2247 10/06/23 0229 10/06/23 0651 10/06/23 0801  BP: (!) 90/52 (!) 94/52 (!) 95/56 (!) 90/56  Pulse: 100 98 86 88  Resp: 18 (!) 26 (!) 26 18  Temp: 98.7 F (37.1 C) 98 F (36.7 C) 99 F (37.2 C) 98.5 F (36.9 C)  TempSrc: Oral Oral Oral Oral  SpO2: 94% 100% 100% 98%  Weight:      Height:       Filed Weights   09/28/23 2012 09/29/23 1312 10/05/23 1340  Weight: 104.3 kg 104.7 kg 104.7 kg    Examination:  Some diaphoresis Still pleasant no distress Crackles posterolaterally on the left No wheeze rales Massive anasarca S1-S2 no murmur ROM intact moving 4 limbs equally Massive anasarca with swollen testes  Data Reviewed: reviewed   CBC    Component Value Date/Time   WBC 6.8 10/06/2023 0338   RBC 2.87 (L) 10/06/2023 0338   HGB 8.1 (L) 10/06/2023 0338   HGB 9.5 (L) 09/26/2023 1529   HCT 25.3 (L) 10/06/2023 0338   PLT 85 (L) 10/06/2023 0338   PLT 114 (L) 09/26/2023 1529   MCV 88.2 10/06/2023 0338   MCH 28.2 10/06/2023 0338   MCHC 32.0 10/06/2023 0338   RDW 17.3 (H) 10/06/2023 0338   LYMPHSABS 0.5 (L) 09/28/2023 2038   MONOABS 0.4 09/28/2023 2038   EOSABS 0.0 09/28/2023 2038   BASOSABS 0.0 09/28/2023 2038      Latest Ref Rng & Units 10/06/2023    3:38 AM 10/05/2023    3:04 AM 10/04/2023    3:31 AM  CMP  Glucose 70 - 99 mg/dL 528  91  91   BUN 8 - 23 mg/dL 93  81  69   Creatinine 0.61 - 1.24 mg/dL 4.13  2.44  0.10   Sodium 135 - 145 mmol/L 131  135   137   Potassium 3.5 - 5.1 mmol/L 4.3  4.2  3.9   Chloride 98 - 111 mmol/L 104  106  108   CO2 22 - 32 mmol/L 15  20  20    Calcium 8.9 - 10.3 mg/dL 8.7  9.4  9.4      Scheduled Meds:  atorvastatin  10 mg Oral QHS   Chlorhexidine Gluconate Cloth  6 each Topical Daily   docusate sodium  100 mg Oral BID   metoprolol tartrate  12.5 mg Oral BID   mirtazapine  30 mg Oral QHS   multivitamin with minerals  1 tablet Oral Daily   oxybutynin  10 mg Oral QHS   pantoprazole  40 mg Oral Daily   predniSONE  40 mg Oral Q breakfast   senna  1 tablet Oral BID   sodium chloride flush  3 mL Intravenous Q12H   Continuous Infusions:  sodium bicarbonate 150 mEq in dextrose 5 % 1,150 mL infusion      Time  55  Rhetta Mura, MD  [facility]

## 2023-10-06 NOTE — Progress Notes (Signed)
PHARMACY - ANTICOAGULATION CONSULT NOTE  Pharmacy Consult for Heparin Indication: pulmonary embolus  Allergies  Allergen Reactions   Sildenafil Other (See Comments)    Unknown reaction - reported by [facility]    Patient Measurements: Height: 6\' 4"  (193 cm) Weight: 104.7 kg (230 lb 13.2 oz) IBW/kg (Calculated) : 86.8 Heparin Dosing Weight:  104.7  Vital Signs: Temp: 98 F (36.7 C) (12/07 1256) Temp Source: Oral (12/07 1256) BP: 95/53 (12/07 1256) Pulse Rate: 83 (12/07 1256)  Labs: Recent Labs    10/04/23 0331 10/05/23 0304 10/06/23 0338  HGB 8.7* 8.4* 8.1*  HCT 28.0* 26.9* 25.3*  PLT 157 132* 85*  CREATININE 2.90* 3.92* 5.12*    Estimated Creatinine Clearance: 16.1 mL/min (A) (by C-G formula based on SCr of 5.12 mg/dL (H)).   Medical History: Past Medical History:  Diagnosis Date   Arthritis    Chronic kidney disease, stage 3b (HCC) 12/09/2021   Dyslipidemia    GERD (gastroesophageal reflux disease)    Hypertension    PAF (paroxysmal atrial fibrillation) (HCC)    Prostate cancer (HCC) 03/2021   s/p radiation therapy   PTSD (post-traumatic stress disorder)     Assessment:  AC/Heme: SCDs,- NOT on AC PTA for afib - 12/7: Start IV heparin for VQ with +PE. Hgb 8.1 trending down daily. Plts only 85.  Goal of Therapy:  Heparin level 0.3-0.7 units/ml Monitor platelets by anticoagulation protocol: Yes   Plan:  IV heparin 4000 unit IV bolus Heparin infusion 1850 units/hr Check heparin level in 8hr. Daily HL and CBC   Arlicia Paquette S. Merilynn Finland, PharmD, BCPS Clinical Staff Pharmacist [email or web address] Merilynn Finland, [employer] 10/06/2023,3:56 PM

## 2023-10-06 NOTE — Plan of Care (Signed)

## 2023-10-06 NOTE — Progress Notes (Signed)
Bilateral lower extremity venous duplex has been completed. Preliminary results can be found in CV Proc through chart review.  Results were given to the patient's nurse, Vernona Rieger.  10/06/23 4:33 PM Olen Cordial RVT

## 2023-10-06 NOTE — Consult Note (Cosign Needed)
Urology Consult Note   Requesting Attending Physician:  Rhetta Mura, MD Service Providing Consult: Urology  Consulting Attending: Dr. Alvester Morin   Reason for Consult: Bilateral hydronephrosis  HPI: Jerry Tran is seen in consultation for reasons noted above at the request of Rhetta Mura, MD. [facility] significant for mantle cell lymphoma, CKD, diastolic CHF, A-fib, HLD with edema of the legs and groin for the last 2 weeks with word missing shortness of breath.  [facility] was consulted about mild bilateral hydronephrosis 2/2 extrinsic compression from bulky lymphadenopathy and severe penoscrotal edema.  Pt now s/p bilateral ureteral stent placement on 10/05/2023/   Update 12/7:  Pt with continued creatinine uptrend.  RUS ordered to eval for residual hydro.  Pt with also mild tachycardia overnight and softer systolics this AM.   ------------------  Assessment:  77 y.o. male with bilateral hydronephrosis in the context of lymphoma.   Recommendations: # Bilateral hydronephrosis 2/2 extrinsic lymphatic compression s/p bilateral ureteral stents Continue to trend daily labs Obtain RUS to eval for residual hydro. If still present, may need bilateral PCN placement with IR Please obtain dedicated UA and urine culture in setting of hemodynamic changes. Can consider abx if clinica concern for infection  Will follow up nephrology recommendations  # Penoscrotal edema Diffuse anasarca.  Recommend keeping scrotum/penis elevated.  If his fluid retention can be maintained this will be self-limiting.  Patient reports no difficulty urinating and understands that if at any point he is unable to urinate appropriately he needs to notify this provider so the Foley catheter can be placed.  Urology will continue to follow  US shows no change or worsening hydro, nephrology has concerns regarding ATN. No indication for nephrostomy tubes at this time.  I was present for the interview and  exam I agree with the assessment and plan  Sherle Poe MD    Past Medical History: Past Medical History:  Diagnosis Date   Arthritis    Chronic kidney disease, stage 3b (HCC) 12/09/2021   Dyslipidemia    GERD (gastroesophageal reflux disease)    Hypertension    PAF (paroxysmal atrial fibrillation) (HCC)    Prostate cancer (HCC) 03/2021   s/p radiation therapy   PTSD (post-traumatic stress disorder)     Past Surgical History:  Past Surgical History:  Procedure Laterality Date   IR IMAGING GUIDED PORT INSERTION  11/06/2022   LIPOMA EXCISION Right 12/09/2021   Procedure: RIGHT SUPERCLAVICULAR LYMPH NODE EXCISION;  Surgeon: Andria Meuse, MD;  Location: [facility] OR;  Service: General;  Laterality: Right;    Medication: Current Facility-Administered Medications  Medication Dose Route Frequency Provider Last Rate Last Admin   acetaminophen (TYLENOL) tablet 650 mg  650 mg Oral Q6H PRN Doutova, Anastassia, MD       Or   acetaminophen (TYLENOL) suppository 650 mg  650 mg Rectal Q6H PRN Doutova, Anastassia, MD       atorvastatin (LIPITOR) tablet 10 mg  10 mg Oral QHS Doutova, Anastassia, MD   10 mg at 10/05/23 2135   bisacodyl (DULCOLAX) suppository 10 mg  10 mg Rectal Daily PRN Therisa Doyne, MD       Chlorhexidine Gluconate Cloth 2 % PADS 6 each  6 each Topical Daily Kc, Ramesh, MD   6 each at 10/05/23 1010   docusate sodium (COLACE) capsule 100 mg  100 mg Oral BID Therisa Doyne, MD   100 mg at 10/05/23 2135   HYDROcodone-acetaminophen (NORCO/VICODIN) 5-325 MG per tablet 1-2 tablet  1-2 tablet  Oral Q4H PRN Therisa Doyne, MD       hyoscyamine (ANASPAZ) disintergrating tablet 0.125 mg  0.125 mg Sublingual Q6H PRN Rhetta Mura, MD       metoprolol tartrate (LOPRESSOR) tablet 12.5 mg  12.5 mg Oral BID Kc, Ramesh, MD   12.5 mg at 10/05/23 2135   mirtazapine (REMERON) tablet 30 mg  30 mg Oral QHS Therisa Doyne, MD   30 mg at 10/05/23 2143   multivitamin with  minerals tablet 1 tablet  1 tablet Oral Daily Kc, Dayna Barker, MD   1 tablet at 10/05/23 1008   ondansetron (ZOFRAN) tablet 4 mg  4 mg Oral Q6H PRN Therisa Doyne, MD       Or   ondansetron (ZOFRAN) injection 4 mg  4 mg Intravenous Q6H PRN Doutova, Anastassia, MD       oxybutynin (DITROPAN-XL) 24 hr tablet 10 mg  10 mg Oral QHS Ray Church III, MD   10 mg at 10/05/23 1645   pantoprazole (PROTONIX) EC tablet 40 mg  40 mg Oral Daily Doutova, Anastassia, MD   40 mg at 10/05/23 1008   polyethylene glycol (MIRALAX / GLYCOLAX) packet 17 g  17 g Oral Daily PRN Doutova, Jonny Ruiz, MD       predniSONE (DELTASONE) tablet 40 mg  40 mg Oral Q breakfast Doutova, Anastassia, MD   40 mg at 10/06/23 0826   senna (SENOKOT) tablet 8.6 mg  1 tablet Oral BID Therisa Doyne, MD   8.6 mg at 10/05/23 2135   sodium bicarbonate 150 mEq in dextrose 5 % 1,150 mL infusion   Intravenous Continuous Samtani, Jai-Gurmukh, MD       sodium chloride flush (NS) 0.9 % injection 10-40 mL  10-40 mL Intracatheter PRN Kc, Ramesh, MD       sodium chloride flush (NS) 0.9 % injection 3 mL  3 mL Intravenous Q12H Doutova, Anastassia, MD   3 mL at 10/05/23 2200   sodium chloride flush (NS) 0.9 % injection 3 mL  3 mL Intravenous PRN Therisa Doyne, MD        Allergies: Allergies  Allergen Reactions   Sildenafil Other (See Comments)    Unknown reaction - reported by [facility]    Social History: Social History   Tobacco Use   Smoking status: Former    Current packs/day: 1.00    Average packs/day: 1 pack/day for 15.0 years (15.0 ttl pk-yrs)    Types: Cigarettes   Smokeless tobacco: Never  Substance Use Topics   Alcohol use: Not Currently    Comment: h/o heavy use   Drug use: Not Currently    Types: Marijuana, Cocaine    Comment: remote    Family History Family History  Problem Relation Age of Onset   Cancer Neg Hx     Review of Systems  Genitourinary:  Negative for dysuria, flank pain, frequency, hematuria and  urgency.     Objective   Vital signs in last 24 hours: BP (!) 90/56 (BP Location: Left Arm)   Pulse 88   Temp 98.5 F (36.9 C) (Oral)   Resp 18   Ht 6\' 4"  (1.93 m)   Wt 104.7 kg   SpO2 98%   BMI 28.10 kg/m   Physical Exam General: NAD, A&O, resting, appropriate HEENT: /AT Pulmonary: Normal work of breathing Cardiovascular: RRR, no cyanosis Abdomen: Soft, NTTP, nondistended GU: Severely swollen penis and scrotum. Neuro: Appropriate, no focal neurological deficits  Most Recent Labs: Lab Results  Component Value Date  WBC 6.8 10/06/2023   HGB 8.1 (L) 10/06/2023   HCT 25.3 (L) 10/06/2023   PLT 85 (L) 10/06/2023    Lab Results  Component Value Date   NA 131 (L) 10/06/2023   K 4.3 10/06/2023   CL 104 10/06/2023   CO2 15 (L) 10/06/2023   BUN 93 (H) 10/06/2023   CREATININE 5.12 (H) 10/06/2023   CALCIUM 8.7 (L) 10/06/2023   MG 2.7 (H) 09/29/2023   PHOS 4.7 (H) 09/29/2023    Lab Results  Component Value Date   INR 1.1 09/28/2023     Urine Culture: @[id number] (laburin,org,[id number],[id number])@   IMAGING: DG C-Arm 1-60 Min-No Report  Result Date: 10/05/2023 Fluoroscopy was utilized by the requesting physician.  No radiographic interpretation.   DG OR UROLOGY CYSTO IMAGE ([facility] ONLY)  Result Date: 10/05/2023 There is no interpretation for this exam.  This order is for images obtained during a surgical procedure.  Please See "Surgeries" Tab for more information regarding the procedure.    ------ Roby Lofts, MD Resident Physician [facility]    Please contact the urology consult pager with any further questions/concerns.

## 2023-10-06 NOTE — Plan of Care (Signed)
  Problem: Education: Goal: Knowledge of General Education information will improve Description: Including pain rating scale, medication(s)/side effects and non-pharmacologic comfort measures 10/06/2023 0754 by Joanne Chars, RN Outcome: Progressing 10/06/2023 0751 by Joanne Chars, RN Outcome: Progressing   Problem: Health Behavior/Discharge Planning: Goal: Ability to manage health-related needs will improve 10/06/2023 0754 by Joanne Chars, RN Outcome: Progressing 10/06/2023 0751 by Joanne Chars, RN Outcome: Progressing   Problem: Clinical Measurements: Goal: Ability to maintain clinical measurements within normal limits will improve 10/06/2023 0754 by Joanne Chars, RN Outcome: Progressing 10/06/2023 0751 by Joanne Chars, RN Outcome: Progressing Goal: Will remain free from infection 10/06/2023 0754 by Joanne Chars, RN Outcome: Progressing 10/06/2023 0751 by Joanne Chars, RN Outcome: Progressing Goal: Diagnostic test results will improve 10/06/2023 0754 by Joanne Chars, RN Outcome: Progressing 10/06/2023 0751 by Joanne Chars, RN Outcome: Progressing Goal: Respiratory complications will improve 10/06/2023 0754 by Joanne Chars, RN Outcome: Progressing 10/06/2023 0751 by Joanne Chars, RN Outcome: Progressing Goal: Cardiovascular complication will be avoided 10/06/2023 0754 by Joanne Chars, RN Outcome: Progressing 10/06/2023 0751 by Joanne Chars, RN Outcome: Progressing   Problem: Activity: Goal: Risk for activity intolerance will decrease 10/06/2023 0754 by Joanne Chars, RN Outcome: Progressing 10/06/2023 0751 by Joanne Chars, RN Outcome: Progressing   Problem: Nutrition: Goal: Adequate nutrition will be maintained 10/06/2023 0754 by Joanne Chars, RN Outcome: Progressing 10/06/2023 0751 by Joanne Chars, RN Outcome: Progressing   Problem: Coping: Goal: Level of anxiety will decrease 10/06/2023 0754 by Joanne Chars, RN Outcome: Progressing 10/06/2023 0751 by  Joanne Chars, RN Outcome: Progressing   Problem: Elimination: Goal: Will not experience complications related to bowel motility 10/06/2023 0754 by Joanne Chars, RN Outcome: Progressing 10/06/2023 0751 by Joanne Chars, RN Outcome: Progressing Goal: Will not experience complications related to urinary retention 10/06/2023 0754 by Joanne Chars, RN Outcome: Progressing 10/06/2023 0751 by Joanne Chars, RN Outcome: Progressing   Problem: Pain Management: Goal: General experience of comfort will improve 10/06/2023 0754 by Joanne Chars, RN Outcome: Progressing 10/06/2023 0751 by Joanne Chars, RN Outcome: Progressing   Problem: Safety: Goal: Ability to remain free from injury will improve 10/06/2023 0754 by Joanne Chars, RN Outcome: Progressing 10/06/2023 0751 by Joanne Chars, RN Outcome: Progressing   Problem: Skin Integrity: Goal: Risk for impaired skin integrity will decrease 10/06/2023 0754 by Joanne Chars, RN Outcome: Progressing 10/06/2023 0751 by Joanne Chars, RN Outcome: Progressing
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
## 2023-10-06 NOTE — Progress Notes (Signed)
Mobility Specialist - Progress Note  Pre-mobility: 84 bpm HR, 93% SpO2 During mobility: 95% SpO2 Post-mobility: 104 bpm HR, 95% SPO2   10/06/23 1247  Mobility  Activity Ambulated with assistance in hallway  Level of Assistance Contact guard assist, steadying assist  Assistive Device [place];Other (Comment) (IV Pole)  Distance Ambulated (ft) 200 ft  Range of Motion/Exercises Active  Activity Response Tolerated well  Mobility Referral Yes  Mobility visit 1 Mobility  Mobility Specialist Start Time (ACUTE ONLY) 1230  Mobility Specialist Stop Time (ACUTE ONLY) 1247  Mobility Specialist Time Calculation (min) (ACUTE ONLY) 17 min   Pt was found in bed and agreeable to ambulate. Pt had no complaints with session. At EOS returned to bed with all needs met. Call bell in reach and wife in room.  Billey Chang Mobility Specialist

## 2023-10-06 NOTE — Progress Notes (Signed)
  Interdisciplinary Goals of Care Family Meeting   Date carried out: 10/06/2023  Location of the meeting: Bedside  Member's involved: Physician and Family Member or next of kin  Durable Power of Attorney or Environmental health practitioner:   Wife  Discussion: We discussed goals of care for [employer] .  I explained the incurable nature of hisblk fem cancer and he is quite aware of this as they had a discussion 2 weeks ago with regards to goals of care with primary oncologist I have explained the slight turn for the worse that patient is taking with AKI, pneumonia and we will have to monitor closely At this stage patient would want a trial of resuscitation if gets worse-I will follow-up with further discussion if he does appear worse  Code status:   Code Status: Full Code   Disposition: Continue current acute care  Time spent for the meeting:   15    Rhetta Mura, MD  10/06/2023, 1:57 PM

## 2023-10-07 DIAGNOSIS — N179 Acute kidney failure, unspecified: Secondary | ICD-10-CM | POA: Diagnosis not present

## 2023-10-07 LAB — CBC
HCT: 23.2 % — ABNORMAL LOW (ref 39.0–52.0)
Hemoglobin: 7.8 g/dL — ABNORMAL LOW (ref 13.0–17.0)
MCH: 28.8 pg (ref 26.0–34.0)
MCHC: 33.6 g/dL (ref 30.0–36.0)
MCV: 85.6 fL (ref 80.0–100.0)
Platelets: 58 10*3/uL — ABNORMAL LOW (ref 150–400)
RBC: 2.71 MIL/uL — ABNORMAL LOW (ref 4.22–5.81)
RDW: 17.4 % — ABNORMAL HIGH (ref 11.5–15.5)
WBC: 8.4 10*3/uL (ref 4.0–10.5)
nRBC: 0 % (ref 0.0–0.2)

## 2023-10-07 LAB — RENAL FUNCTION PANEL
Albumin: 2.8 g/dL — ABNORMAL LOW (ref 3.5–5.0)
Anion gap: 12 (ref 5–15)
BUN: 97 mg/dL — ABNORMAL HIGH (ref 8–23)
CO2: 19 mmol/L — ABNORMAL LOW (ref 22–32)
Calcium: 8.1 mg/dL — ABNORMAL LOW (ref 8.9–10.3)
Chloride: 102 mmol/L (ref 98–111)
Creatinine, Ser: 4.85 mg/dL — ABNORMAL HIGH (ref 0.61–1.24)
GFR, Estimated: 12 mL/min — ABNORMAL LOW (ref >60)
Glucose, Bld: 106 mg/dL — ABNORMAL HIGH (ref 70–99)
Phosphorus: 5.7 mg/dL — ABNORMAL HIGH (ref 2.5–4.6)
Potassium: 4.1 mmol/L (ref 3.5–5.1)
Sodium: 133 mmol/L — ABNORMAL LOW (ref 135–145)

## 2023-10-07 LAB — HEPARIN LEVEL (UNFRACTIONATED)
Heparin Unfractionated: 0.93 [IU]/mL — ABNORMAL HIGH (ref 0.30–0.70)
Heparin Unfractionated: 0.76 [IU]/mL — ABNORMAL HIGH (ref 0.30–0.70)
Heparin Unfractionated: 0.57 [IU]/mL (ref 0.30–0.70)

## 2023-10-07 LAB — BASIC METABOLIC PANEL
Anion gap: 12 (ref 5–15)
BUN: 104 mg/dL — ABNORMAL HIGH (ref 8–23)
CO2: 19 mmol/L — ABNORMAL LOW (ref 22–32)
Calcium: 8.1 mg/dL — ABNORMAL LOW (ref 8.9–10.3)
Chloride: 103 mmol/L (ref 98–111)
Creatinine, Ser: 4.75 mg/dL — ABNORMAL HIGH (ref 0.61–1.24)
GFR, Estimated: 12 mL/min — ABNORMAL LOW (ref >60)
Glucose, Bld: 104 mg/dL — ABNORMAL HIGH (ref 70–99)
Potassium: 4.1 mmol/L (ref 3.5–5.1)
Sodium: 134 mmol/L — ABNORMAL LOW (ref 135–145)

## 2023-10-07 LAB — HEMOGLOBIN AND HEMATOCRIT, BLOOD
HCT: 23.2 % — ABNORMAL LOW (ref 39.0–52.0)
Hemoglobin: 7.7 g/dL — ABNORMAL LOW (ref 13.0–17.0)

## 2023-10-07 MED ORDER — HEPARIN (PORCINE) 25000 UT/250ML-% IV SOLN
1400.0000 [IU]/h | INTRAVENOUS | Status: DC
Start: 2023-10-07 — End: 2023-10-09
  Administered 2023-10-07: 1400 [IU]/h via INTRAVENOUS
  Administered 2023-10-07: 1500 [IU]/h via INTRAVENOUS
  Administered 2023-10-08: 1400 [IU]/h via INTRAVENOUS
  Filled 2023-10-07 (×3): qty 250

## 2023-10-07 MED ORDER — HYOSCYAMINE SULFATE 0.125 MG SL SUBL: 0.1250 mg | SUBLINGUAL_TABLET | Freq: Four times a day (QID) | SUBLINGUAL | Status: DC | PRN

## 2023-10-07 MED ORDER — AMOXICILLIN-POT CLAVULANATE 500-125 MG PO TABS
1.0000 | ORAL_TABLET | Freq: Two times a day (BID) | ORAL | Status: DC
  Administered 2023-10-07: 1 via ORAL
  Filled 2023-10-07: qty 1

## 2023-10-07 MED ORDER — SODIUM CHLORIDE 0.9 % IR SOLN
3000.0000 mL | Status: DC
  Administered 2023-10-07 – 2023-10-09 (×4): 3000 mL

## 2023-10-07 MED ORDER — FUROSEMIDE 10 MG/ML IJ SOLN
80.0000 mg | Freq: Once | INTRAMUSCULAR | Status: AC
  Administered 2023-10-07: 80 mg via INTRAVENOUS
  Filled 2023-10-07: qty 8

## 2023-10-07 NOTE — Progress Notes (Signed)
PHARMACY - ANTICOAGULATION CONSULT NOTE  Pharmacy Consult for Heparin Indication: pulmonary embolus  Allergies  Allergen Reactions   Sildenafil Other (See Comments)    Unknown reaction - reported by [facility]    Patient Measurements: Height: 6\' 4"  (193 cm) Weight: 104.7 kg (230 lb 13.2 oz) IBW/kg (Calculated) : 86.8 Heparin Dosing Weight:  104.7 kg  Vital Signs: Temp: 98.2 F (36.8 C) (12/08 1330) Temp Source: Oral (12/08 1330) BP: 96/70 (12/08 1330) Pulse Rate: 69 (12/08 1330)  Labs: Recent Labs    10/05/23 0304 10/06/23 0338 10/07/23 0113 10/07/23 1551 10/07/23 1557  HGB 8.4* 8.1* 7.8*  --  7.7*  HCT 26.9* 25.3* 23.2*  --  23.2*  PLT 132* 85* 58*  --   --   HEPARINUNFRC  --   --  0.93* 0.76*  --   CREATININE 3.92* 5.12* 4.75*  4.85*  --   --     Estimated Creatinine Clearance: 17 mL/min (A) (by C-G formula based on SCr of 4.85 mg/dL (H)).   Medical History: Past Medical History:  Diagnosis Date   Arthritis    Chronic kidney disease, stage 3b (HCC) 12/09/2021   Dyslipidemia    GERD (gastroesophageal reflux disease)    Hypertension    PAF (paroxysmal atrial fibrillation) (HCC)    Prostate cancer (HCC) 03/2021   s/p radiation therapy   PTSD (post-traumatic stress disorder)     Assessment:  AC/Heme: SCDs,- NOT on AC PTA for afib - 12/7: Start IV heparin for VQ with +PE. Hgb 8.1 trending down daily. Plts only 85. - 12/8: Hep level 0.93, Hgb 7.9 down slightly, Plts 58 down. RN notes new BRB from penis causing several gown and towel changes>>start CBI - Repeat HL 0.76 slight above goal. Hgb 7.7. RN reports CBI fluid still pretty red.  Goal of Therapy:  Heparin level 0.3-0.7 units/ml Monitor platelets by anticoagulation protocol: Yes   Plan:  - Decrease IV heparin to 1400 units/hr Recheck in 6-8 hrs Daily HL and CBC   Mindi Akerson S. Merilynn Finland, PharmD, BCPS Clinical Staff Pharmacist [email or web address] Merilynn Finland, Djeneba Barsch Stillinger 10/07/2023,4:30 PM
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
## 2023-10-07 NOTE — Progress Notes (Signed)
PHARMACY - ANTICOAGULATION CONSULT NOTE  Pharmacy Consult for Heparin Indication: pulmonary embolus  Allergies  Allergen Reactions   Sildenafil Other (See Comments)    Unknown reaction - reported by [facility]    Patient Measurements: Height: 6\' 4"  (193 cm) Weight: 104.7 kg (230 lb 13.2 oz) IBW/kg (Calculated) : 86.8 Heparin Dosing Weight:  104.7  Vital Signs: Temp: 97.7 F (36.5 C) (12/07 1950) Temp Source: Oral (12/07 1950) BP: 114/59 (12/07 1950) Pulse Rate: 78 (12/07 1950)  Labs: Recent Labs    10/04/23 0331 10/05/23 0304 10/06/23 0338 10/07/23 0113  HGB 8.7* 8.4* 8.1* 7.8*  HCT 28.0* 26.9* 25.3* 23.2*  PLT 157 132* 85* 58*  HEPARINUNFRC  --   --   --  0.93*  CREATININE 2.90* 3.92* 5.12*  --     Estimated Creatinine Clearance: 16.1 mL/min (A) (by C-G formula based on SCr of 5.12 mg/dL (H)).   Medical History: Past Medical History:  Diagnosis Date   Arthritis    Chronic kidney disease, stage 3b (HCC) 12/09/2021   Dyslipidemia    GERD (gastroesophageal reflux disease)    Hypertension    PAF (paroxysmal atrial fibrillation) (HCC)    Prostate cancer (HCC) 03/2021   s/p radiation therapy   PTSD (post-traumatic stress disorder)     Assessment: 77 yo M with +PE per VQ scan. Hgb 8.1 trending down daily. Plts only 85. Pharmacy consulted to start IV heparin.   10/07/2023: Heparin level 0.93- above goal range on IV heparin 1850 units/hr.  Heparin infusing in L PIV and labs drawn from port-a-cath.  CBC: Hg 7.8, pltc 58- low and trending down.  >50% fall in plt since admission however since only exposed to IV heparin for last 8 hours it is unlikely HITT  Will continue to monitor.   RN notes he has increased bleeding from Foley catheter.   Scr 5.12- trending up No infusion interruptions reported by RN  Goal of Therapy:  Heparin level 0.3-0.7 units/ml Monitor platelets by anticoagulation protocol: Yes   Plan:  Hold heparin x1h then decrease to 1500 units/hr Recheck  heparin level in 8hr. Daily HL and CBC while on heparin   Junita Push PharmD 10/07/2023,1:52 AM

## 2023-10-07 NOTE — Progress Notes (Signed)
[facility] NEPHROLOGY PROGRESS NOTE  Assessment/ Plan: Pt is a 77 y.o. yo male with past medical history significant for mantle cell lymphoma, hypertension, dyslipidemia, PTSD, CKD 3A with baseline creatinine level around 1.2-1.4 APF, CHF consulted for AKI.  # Acute kidney injury on CKD stage IIIa likely due to combination of obstructive uropathy and ischemic ATN from relative hypotension.  He is status post a bilateral tubal J ureteral stent placement with increased urine output and improving renal labs.  The physical exam consistent with anasarca/fluid overload therefore I will discontinue IV fluid.  I will order a dose of IV Lasix to augment diuresis.  Continue with strict ins and out and daily lab.  No indication for dialysis.  # Bilateral hydronephrosis due to extrinsic lymphatic compression status post bilateral ureteral stent by urologist.  Currently he has hematuria in the setting of heparin drip, urology is following with no plan for nephrostomy tube at this time.  # Anion gap metabolic acidosis: Treated with sodium bicarbonate, follow labs.  Diuretics will help with volume contraction.  # Hypoxia/PE.  Currently on heparin drip per primary team.  # Metastatic mantle cell lymphoma: Currently on prednisone and oncology is following.  # Hyponatremia, hypervolemic: Try diuretics today.  Follow labs.  Subjective: I have seen and examined the patient at the bedside.  Urine output is around 2.7 L.  He reports diffuse body swelling including his scrotal swelling.  The Foley catheter has hematuria.  No chest pain or shortness of breath this morning. Objective Vital signs in last 24 hours: Vitals:   10/06/23 1000 10/06/23 1256 10/06/23 1950 10/07/23 0607  BP:  (!) 95/53 (!) 114/59 91/67  Pulse:  83 78 86  Resp:  16 18 (!) 22  Temp:  98 F (36.7 C) 97.7 F (36.5 C) (!) 97.4 F (36.3 C)  TempSrc:  Oral Oral Oral  SpO2: 99% 97% 98% 98%  Weight:      Height:       Weight  change:   Intake/Output Summary (Last 24 hours) at 10/07/2023 0901 Last data filed at 10/07/2023 1610 Gross per 24 hour  Intake 809.38 ml  Output 2450 ml  Net -1640.62 ml       Labs: RENAL PANEL Recent Labs  Lab 10/03/23 0409 10/04/23 0331 10/05/23 0304 10/06/23 0338 10/07/23 0113  NA 138 137 135 131* 134*  133*  K 3.9 3.9 4.2 4.3 4.1  4.1  CL 111 108 106 104 103  102  CO2 19* 20* 20* 15* 19*  19*  GLUCOSE 86 91 91 109* 104*  106*  BUN 65* 69* 81* 93* 104*  97*  CREATININE 2.34* 2.90* 3.92* 5.12* 4.75*  4.85*  CALCIUM 9.2 9.4 9.4 8.7* 8.1*  8.1*  PHOS  --   --   --   --  5.7*  ALBUMIN  --   --   --   --  2.8*    Liver Function Tests: Recent Labs  Lab 10/07/23 0113  ALBUMIN 2.8*   No results for input(s): "LIPASE", "AMYLASE" in the last 168 hours. No results for input(s): "AMMONIA" in the last 168 hours. CBC: Recent Labs    09/29/23 0431 09/29/23 0845 09/29/23 1421 09/30/23 0351 10/03/23 0409 10/04/23 0331 10/05/23 0304 10/06/23 0338 10/07/23 0113  HGB  --    < >  --    < > 8.4* 8.7* 8.4* 8.1* 7.8*  MCV  --    < >  --    < >  91.2 90.0 90.3 88.2 85.6  VITAMINB12 841  --   --   --   --   --   --   --   --   FOLATE 12.8  --   --   --   --   --   --   --   --   FERRITIN  --   --  188  --   --   --   --   --   --   TIBC  --   --  281  --   --   --   --   --   --   IRON  --   --  55  --   --   --   --   --   --   RETICCTPCT  --   --  1.4  --   --   --   --   --   --    < > = values in this interval not displayed.    Cardiac Enzymes: No results for input(s): "CKTOTAL", "CKMB", "CKMBINDEX", "TROPONINI" in the last 168 hours. CBG: No results for input(s): "GLUCAP" in the last 168 hours.  Iron Studies: No results for input(s): "IRON", "TIBC", "TRANSFERRIN", "FERRITIN" in the last 72 hours. Studies/Results: VAS [place] LOWER EXTREMITY VENOUS (DVT)  Result Date: 10/07/2023  Lower Venous DVT Study Patient Name:  Jerry Tran  Date of Exam:   10/06/2023  Medical Rec #: [id number]     Accession #:    [id number] Date of Birth: 1946/09/19     Patient Gender: M Patient Age:   77 years Exam Location:  [facility] [place] Procedure:      VAS [place] LOWER EXTREMITY VENOUS (DVT) Referring Phys: Rhetta Mura --------------------------------------------------------------------------------  Indications: Swelling, and pulmonary embolism.  Risk Factors: Cancer. Limitations: Body habitus and poor ultrasound/tissue interface. Comparison Study: No prior studies. Performing Technologist: Chanda Busing RVT  Examination Guidelines: A complete evaluation includes B-mode imaging, spectral Doppler, color Doppler, and power Doppler as needed of all accessible portions of each vessel. Bilateral testing is considered an integral part of a complete examination. Limited examinations for reoccurring indications may be performed as noted. The reflux portion of the exam is performed with the patient in reverse Trendelenburg.  +---------+---------------+---------+-----------+----------+-------------------+ RIGHT    CompressibilityPhasicitySpontaneityPropertiesThrombus Aging      +---------+---------------+---------+-----------+----------+-------------------+ CFV      Partial        Yes      Yes                  Acute               +---------+---------------+---------+-----------+----------+-------------------+ FV Prox  None           No       No                   Acute               +---------+---------------+---------+-----------+----------+-------------------+ FV Mid   None           No       No                   Acute               +---------+---------------+---------+-----------+----------+-------------------+ FV DistalNone           No       No  Acute               +---------+---------------+---------+-----------+----------+-------------------+ PFV                                                   Not well visualized  +---------+---------------+---------+-----------+----------+-------------------+ POP      Full           Yes      Yes                                      +---------+---------------+---------+-----------+----------+-------------------+ PTV      Full                                                             +---------+---------------+---------+-----------+----------+-------------------+ PERO     Full                                                             +---------+---------------+---------+-----------+----------+-------------------+ EIV                     Yes      Yes                                      +---------+---------------+---------+-----------+----------+-------------------+ The EIV appears patent.  +---------+---------------+---------+-----------+----------+--------------+ LEFT     CompressibilityPhasicitySpontaneityPropertiesThrombus Aging +---------+---------------+---------+-----------+----------+--------------+ CFV      Full           Yes      Yes                                 +---------+---------------+---------+-----------+----------+--------------+ SFJ      Full                                                        +---------+---------------+---------+-----------+----------+--------------+ FV Prox  Full                                                        +---------+---------------+---------+-----------+----------+--------------+ FV Mid   Full                                                        +---------+---------------+---------+-----------+----------+--------------+ FV DistalFull                                                        +---------+---------------+---------+-----------+----------+--------------+  PFV      Full                                                        +---------+---------------+---------+-----------+----------+--------------+ POP      None           No       No                    Acute          +---------+---------------+---------+-----------+----------+--------------+ PTV      Full                                                        +---------+---------------+---------+-----------+----------+--------------+ PERO     Full                                                        +---------+---------------+---------+-----------+----------+--------------+ Gastroc  Full                                                        +---------+---------------+---------+-----------+----------+--------------+     Summary: RIGHT: - Findings consistent with acute deep vein thrombosis involving the right common femoral vein, and right femoral vein.  - No cystic structure found in the popliteal fossa.  LEFT: - Findings consistent with acute deep vein thrombosis involving the left popliteal vein.  - No cystic structure found in the popliteal fossa.  *See table(s) above for measurements and observations. Electronically signed by Carolynn Sayers on 10/07/2023 at 8:57:49 AM.    Final    NM Pulmonary Perfusion  Result Date: 10/06/2023 CLINICAL DATA:  Aggressive shortness of breath. Left groin swelling. Severe acute kidney injury. Mantle cell lymphoma. EXAM: NUCLEAR MEDICINE PERFUSION LUNG SCAN TECHNIQUE: Perfusion images were obtained in multiple projections after intravenous injection of radiopharmaceutical. Ventilation scans intentionally deferred if perfusion scan and chest x-ray adequate for interpretation during COVID 19 epidemic. RADIOPHARMACEUTICALS:  3.95 mCi Tc-77m MAA IV COMPARISON:  Portable chest obtained earlier today. FINDINGS: Moderate-sized subsegmental perfusion defects in the anteromedial left upper lobe, lateral right lower lobe and anteromedial right middle lobe. No corresponding abnormality at these locations on the chest radiograph. IMPRESSION: Three moderate-sized subsegmental perfusion defects with no corresponding radiographic abnormality, compatible with  pulmonary embolism. Electronically Signed   By: Beckie Salts M.D.   On: 10/06/2023 14:47   US RENAL  Result Date: 10/06/2023 CLINICAL DATA:  "Hydronephrosis".  Lymphoma. EXAM: RENAL / URINARY TRACT ULTRASOUND COMPLETE COMPARISON:  CT 09/28/2023 FINDINGS: Right Kidney: Renal measurements: 10.1 x 4.8 x 6.0 cm = volume: 150 mL. Normal echogenicity. Mild hydronephrosis. Left Kidney: Renal measurements: 12.0 x 5.4 x 5.7 cm = volume: 182 mL. Normal echogenicity. Mild hydronephrosis. Bladder: Foley catheter within. Other: None. IMPRESSION: Similar mild bilateral hydronephrosis. Electronically Signed   By: Ronaldo Miyamoto  Reche Dixon M.D.   On: 10/06/2023 09:31   DG CHEST PORT 1 VIEW  Result Date: 10/06/2023 CLINICAL DATA:  Pneumonia and shortness of breath EXAM: PORTABLE CHEST 1 VIEW COMPARISON:  09/28/2023 FINDINGS: Right Port-A-Cath tip at superior caval/atrial junction. Midline trachea. Patient rotated minimally right. Borderline cardiomegaly. Tortuous thoracic aorta. No pleural effusion or pneumothorax. Mild/subtle density projecting along the left heart border and lateral left hemidiaphragm. Right lung clear. IMPRESSION: Subtle density projecting along the left heart border and left hemidiaphragm. Favored to represent interval atelectasis. Given the clinical history, early or resolving pneumonia could look similar. Depending on clinical concern, consider radiographic follow-up at 5-7 days. Electronically Signed   By: Jeronimo Greaves M.D.   On: 10/06/2023 09:25   DG C-Arm 1-60 Min-No Report  Result Date: 10/05/2023 Fluoroscopy was utilized by the requesting physician.  No radiographic interpretation.   DG OR UROLOGY CYSTO IMAGE ([facility] ONLY)  Result Date: 10/05/2023 There is no interpretation for this exam.  This order is for images obtained during a surgical procedure.  Please See "Surgeries" Tab for more information regarding the procedure.    Medications: Infusions:  ampicillin-sulbactam (UNASYN) IV 3 g (10/07/23  0019)   heparin 1,500 Units/hr (10/07/23 0340)    Scheduled Medications:  atorvastatin  10 mg Oral QHS   Chlorhexidine Gluconate Cloth  6 each Topical Daily   docusate sodium  100 mg Oral BID   multivitamin with minerals  1 tablet Oral Daily   oxybutynin  10 mg Oral QHS   pantoprazole  40 mg Oral Daily   predniSONE  40 mg Oral Q breakfast   senna  1 tablet Oral BID   sodium chloride flush  3 mL Intravenous Q12H    have reviewed scheduled and prn medications.  Physical Exam: General:NAD, comfortable Heart:RRR, s1s2 nl Lungs:clear b/l, no crackle Abdomen:soft, mild distention and abdomen wall has edema. Extremities: Diffuse edema/anasarca, pitting edema up to thigh and abdominal wall. Neurology: Alert, awake and following commands.  Kilo Eshelman Prasad Samnang Shugars 10/07/2023,9:01 AM  LOS: 9 days

## 2023-10-07 NOTE — Procedures (Addendum)
Foley Catheter Placement Note  Indications: 77 y.o. male with hematuria 2/2 bilateral stent placement on hep gtt   Pre-operative Diagnosis: hematuria  Post-operative Diagnosis: Same  Surgeon: Zettie Pho, MD  Assistants: None  Procedure Details  The existing Foley catheter was removed after 10 cc of sterile water was removed from the balloon port.  The patient was placed in the supine position, prepped with Betadine and draped in the usual sterile fashion.  There was significant penile shaft and foreskin swelling, so tight pressure was held along the shaft of the penis for approximately 3 to 4 minutes to help decrease the inflammation.  After doing this, we were able to reduce the foreskin enough to be able to visualize the urethral meatus.  Lidocaine jelly was injected per urethra to help with ease of placement.  A 24 French three-way coud tipped catheter was placed with return of bloody urine.  10 cc of sterile water was used to inflate the balloon.  Approximately 400 cc of normal saline was used to irrigate the bladder.  Mild clot burden was noted, but urine remained light pink after irrigation.  The decision was made to start CBI at a slow drip given that patient is currently on a heparin drip with bilateral stents in place.               Complications: None; patient tolerated the procedure well.  Plan:   1.  Continue CBI at a slow drip, do not clamp.  Urine should be titrated to a light pink.  If catheter clogs, please stop CBI immediately and flush with 5200 cc of normal saline.  If persistent issues with drainage of catheter,, please page urology 2.  Patient currently on antibiotic, periprocedural antibiotics not indicated 3.  See full consult note for full plan       Attending Attestation: Dr. Jennette Bill was available.  Roby Lofts, MD Resident Physician [facility]   The patient was discussed with me and I agree with the assessment and plan

## 2023-10-07 NOTE — Consult Note (Addendum)
Urology Consult Note   Requesting Attending Physician:  Rhetta Mura, MD Service Providing Consult: Urology  Consulting Attending: Dr. Alvester Morin   Reason for Consult: Bilateral hydronephrosis  HPI: Jerry Tran is seen in consultation for reasons noted above at the request of Rhetta Mura, MD. [facility] significant for mantle cell lymphoma, CKD, diastolic CHF, A-fib, HLD with edema of the legs and groin for the last 2 weeks with word missing shortness of breath.  [facility] was consulted about mild bilateral hydronephrosis 2/2 extrinsic compression from bulky lymphadenopathy and severe penoscrotal edema.  Pt now s/p bilateral ureteral stent placement on 10/05/2023  Update 12/8:  Pt with tachycardia yesterday, new PE diagnosed. Now on Hep gtt New onset hematuria - still voiding with catheter, but issues with irrigation at bedside so upsized to 49F hematuria catheter and started on CBI given tomato past like consistency on AM rounds. Please see procedure note for full documentation.  Creatinine now downtrending, favor ATN etiology Hemoglobin stable  ------------------  Assessment:  77 y.o. male with bilateral hydronephrosis in the context of lymphoma.   Recommendations: # Bilateral hydronephrosis 2/2 extrinsic lymphatic compression s/p bilateral ureteral stents Continue to trend daily labs. Favor non-obstructive etiology of kidney function - would continue to defer nephrostomy tubes Follow-up urine culture - believe c/f sepsis alleviated at this time given new dx of PE - but can continue to [place] Appreciate continued nephrology recommendations  #Hematuria Likely 2/2 stent related hematuria in setting of Hep gtt CBI started on slow gtt with pink urine. If concern for clogging, please turn off CBI immediately and flush forward with 50 cc of NS and attempt to dislodge clots. If unable to dislodge and restart CBI after 100 cc of NS, please page urology pager.   #  Penoscrotal edema Diffuse anasarca.  Recommend keeping scrotum/penis elevated.  If his fluid retention can be maintained this will be self-limiting.  Patient reports no difficulty urinating and understands that if at any point he is unable to urinate appropriately he needs to notify this provider so the Foley catheter can be placed.  Urology will continue to follow/.  Roby Lofts, MD Resident Physician [facility]  I was present for the interview and exam I agree with the assessment and plan  Sherle Poe MD     Past Medical History: Past Medical History:  Diagnosis Date   Arthritis    Chronic kidney disease, stage 3b (HCC) 12/09/2021   Dyslipidemia    GERD (gastroesophageal reflux disease)    Hypertension    PAF (paroxysmal atrial fibrillation) (HCC)    Prostate cancer (HCC) 03/2021   s/p radiation therapy   PTSD (post-traumatic stress disorder)     Past Surgical History:  Past Surgical History:  Procedure Laterality Date   IR IMAGING GUIDED PORT INSERTION  11/06/2022   LIPOMA EXCISION Right 12/09/2021   Procedure: RIGHT SUPERCLAVICULAR LYMPH NODE EXCISION;  Surgeon: Andria Meuse, MD;  Location: [facility] OR;  Service: General;  Laterality: Right;    Medication: Current Facility-Administered Medications  Medication Dose Route Frequency Provider Last Rate Last Admin   acetaminophen (TYLENOL) tablet 650 mg  650 mg Oral Q6H PRN Doutova, Anastassia, MD       Or   acetaminophen (TYLENOL) suppository 650 mg  650 mg Rectal Q6H PRN Doutova, Anastassia, MD       Ampicillin-Sulbactam (UNASYN) 3 g in sodium chloride 0.9 % 100 mL IVPB  3 g Intravenous Q12H Norva Pavlov, RPH 200 mL/hr at 10/07/23  0019 3 g at 10/07/23 0019   atorvastatin (LIPITOR) tablet 10 mg  10 mg Oral QHS Therisa Doyne, MD   10 mg at 10/06/23 2320   bisacodyl (DULCOLAX) suppository 10 mg  10 mg Rectal Daily PRN Therisa Doyne, MD       Chlorhexidine Gluconate Cloth 2 % PADS 6 each  6 each  Topical Daily Kc, Ramesh, MD   6 each at 10/06/23 1000   docusate sodium (COLACE) capsule 100 mg  100 mg Oral BID Therisa Doyne, MD   100 mg at 10/06/23 2319   heparin ADULT infusion 100 units/mL (25000 units/235mL)  1,500 Units/hr Intravenous Continuous Phylliss Blakes, RPH 15 mL/hr at 10/07/23 0340 1,500 Units/hr at 10/07/23 0340   HYDROcodone-acetaminophen (NORCO/VICODIN) 5-325 MG per tablet 1-2 tablet  1-2 tablet Oral Q4H PRN Therisa Doyne, MD       multivitamin with minerals tablet 1 tablet  1 tablet Oral Daily Kc, Ramesh, MD   1 tablet at 10/06/23 1107   ondansetron (ZOFRAN) tablet 4 mg  4 mg Oral Q6H PRN Therisa Doyne, MD       Or   ondansetron (ZOFRAN) injection 4 mg  4 mg Intravenous Q6H PRN Doutova, Anastassia, MD       oxybutynin (DITROPAN-XL) 24 hr tablet 10 mg  10 mg Oral QHS Ray Church III, MD   10 mg at 10/06/23 2320   pantoprazole (PROTONIX) EC tablet 40 mg  40 mg Oral Daily Doutova, Anastassia, MD   40 mg at 10/06/23 1107   polyethylene glycol (MIRALAX / GLYCOLAX) packet 17 g  17 g Oral Daily PRN Doutova, Anastassia, MD       predniSONE (DELTASONE) tablet 40 mg  40 mg Oral Q breakfast Doutova, Anastassia, MD   40 mg at 10/06/23 0826   senna (SENOKOT) tablet 8.6 mg  1 tablet Oral BID Therisa Doyne, MD   8.6 mg at 10/06/23 2319   sodium chloride flush (NS) 0.9 % injection 10-40 mL  10-40 mL Intracatheter PRN Kc, Ramesh, MD       sodium chloride flush (NS) 0.9 % injection 3 mL  3 mL Intravenous Q12H Doutova, Anastassia, MD   3 mL at 10/06/23 1116   sodium chloride flush (NS) 0.9 % injection 3 mL  3 mL Intravenous PRN Therisa Doyne, MD        Allergies: Allergies  Allergen Reactions   Sildenafil Other (See Comments)    Unknown reaction - reported by [facility]    Social History: Social History   Tobacco Use   Smoking status: Former    Current packs/day: 1.00    Average packs/day: 1 pack/day for 15.0 years (15.0 ttl pk-yrs)    Types:  Cigarettes   Smokeless tobacco: Never  Substance Use Topics   Alcohol use: Not Currently    Comment: h/o heavy use   Drug use: Not Currently    Types: Marijuana, Cocaine    Comment: remote    Family History Family History  Problem Relation Age of Onset   Cancer Neg Hx     Review of Systems  Genitourinary:  Negative for dysuria, flank pain, frequency, hematuria and urgency.     Objective   Vital signs in last 24 hours: BP 91/67 (BP Location: Left Arm)   Pulse 86   Temp (!) 97.4 F (36.3 C) (Oral)   Resp (!) 22   Ht 6\' 4"  (1.93 m)   Wt 104.7 kg   SpO2 98%   BMI 28.10  kg/m   Physical Exam General: NAD, A&O, resting, appropriate HEENT: [place]/AT Pulmonary: Normal work of breathing Cardiovascular: RRR, no cyanosis Abdomen: Soft, NTTP, nondistended GU: Severely swollen penis and scrotum. Neuro: Appropriate, no focal neurological deficits  Most Recent Labs: Lab Results  Component Value Date   WBC 8.4 10/07/2023   HGB 7.8 (L) 10/07/2023   HCT 23.2 (L) 10/07/2023   PLT 58 (L) 10/07/2023    Lab Results  Component Value Date   NA 133 (L) 10/07/2023   NA 134 (L) 10/07/2023   K 4.1 10/07/2023   K 4.1 10/07/2023   CL 102 10/07/2023   CL 103 10/07/2023   CO2 19 (L) 10/07/2023   CO2 19 (L) 10/07/2023   BUN 97 (H) 10/07/2023   BUN 104 (H) 10/07/2023   CREATININE 4.85 (H) 10/07/2023   CREATININE 4.75 (H) 10/07/2023   CALCIUM 8.1 (L) 10/07/2023   CALCIUM 8.1 (L) 10/07/2023   MG 2.7 (H) 09/29/2023   PHOS 5.7 (H) 10/07/2023    Lab Results  Component Value Date   INR 1.1 09/28/2023     Urine Culture: @[id number] (laburin,org,r9620,r9621)@   IMAGING: VAS [place] LOWER EXTREMITY VENOUS (DVT)  Result Date: 10/07/2023  Lower Venous DVT Study Patient Name:  TOY SNELGROVE  Date of Exam:   10/06/2023 Medical Rec #: [id number]     Accession #:    [id number] Date of Birth: 08/17/1946     Patient Gender: M Patient Age:   25 years Exam Location:  [facility] Procedure:       VAS [place] LOWER EXTREMITY VENOUS (DVT) Referring Phys: Rhetta Mura --------------------------------------------------------------------------------  Indications: Swelling, and pulmonary embolism.  Risk Factors: Cancer. Limitations: Body habitus and poor ultrasound/tissue interface. Comparison Study: No prior studies. Performing Technologist: Chanda Busing RVT  Examination Guidelines: A complete evaluation includes B-mode imaging, spectral Doppler, color Doppler, and power Doppler as needed of all accessible portions of each vessel. Bilateral testing is considered an integral part of a complete examination. Limited examinations for reoccurring indications may be performed as noted. The reflux portion of the exam is performed with the patient in reverse Trendelenburg.  +---------+---------------+---------+-----------+----------+-------------------+ RIGHT    CompressibilityPhasicitySpontaneityPropertiesThrombus Aging      +---------+---------------+---------+-----------+----------+-------------------+ CFV      Partial        Yes      Yes                  Acute               +---------+---------------+---------+-----------+----------+-------------------+ FV Prox  None           No       No                   Acute               +---------+---------------+---------+-----------+----------+-------------------+ FV Mid   None           No       No                   Acute               +---------+---------------+---------+-----------+----------+-------------------+ FV DistalNone           No       No                   Acute               +---------+---------------+---------+-----------+----------+-------------------+ PFV  Not well visualized +---------+---------------+---------+-----------+----------+-------------------+ POP      Full           Yes      Yes                                       +---------+---------------+---------+-----------+----------+-------------------+ PTV      Full                                                             +---------+---------------+---------+-----------+----------+-------------------+ PERO     Full                                                             +---------+---------------+---------+-----------+----------+-------------------+ EIV                     Yes      Yes                                      +---------+---------------+---------+-----------+----------+-------------------+ The EIV appears patent.  +---------+---------------+---------+-----------+----------+--------------+ LEFT     CompressibilityPhasicitySpontaneityPropertiesThrombus Aging +---------+---------------+---------+-----------+----------+--------------+ CFV      Full           Yes      Yes                                 +---------+---------------+---------+-----------+----------+--------------+ SFJ      Full                                                        +---------+---------------+---------+-----------+----------+--------------+ FV Prox  Full                                                        +---------+---------------+---------+-----------+----------+--------------+ FV Mid   Full                                                        +---------+---------------+---------+-----------+----------+--------------+ FV DistalFull                                                        +---------+---------------+---------+-----------+----------+--------------+ PFV      Full                                                        +---------+---------------+---------+-----------+----------+--------------+  POP      None           No       No                   Acute          +---------+---------------+---------+-----------+----------+--------------+ PTV      Full                                                         +---------+---------------+---------+-----------+----------+--------------+ PERO     Full                                                        +---------+---------------+---------+-----------+----------+--------------+ Gastroc  Full                                                        +---------+---------------+---------+-----------+----------+--------------+     Summary: RIGHT: - Findings consistent with acute deep vein thrombosis involving the right common femoral vein, and right femoral vein.  - No cystic structure found in the popliteal fossa.  LEFT: - Findings consistent with acute deep vein thrombosis involving the left popliteal vein.  - No cystic structure found in the popliteal fossa.  *See table(s) above for measurements and observations. Electronically signed by Carolynn Sayers on 10/07/2023 at 8:57:49 AM.    Final    NM Pulmonary Perfusion  Result Date: 10/06/2023 CLINICAL DATA:  Aggressive shortness of breath. Left groin swelling. Severe acute kidney injury. Mantle cell lymphoma. EXAM: NUCLEAR MEDICINE PERFUSION LUNG SCAN TECHNIQUE: Perfusion images were obtained in multiple projections after intravenous injection of radiopharmaceutical. Ventilation scans intentionally deferred if perfusion scan and chest x-ray adequate for interpretation during COVID 19 epidemic. RADIOPHARMACEUTICALS:  3.95 mCi Tc-29m MAA IV COMPARISON:  Portable chest obtained earlier today. FINDINGS: Moderate-sized subsegmental perfusion defects in the anteromedial left upper lobe, lateral right lower lobe and anteromedial right middle lobe. No corresponding abnormality at these locations on the chest radiograph. IMPRESSION: Three moderate-sized subsegmental perfusion defects with no corresponding radiographic abnormality, compatible with pulmonary embolism. Electronically Signed   By: Beckie Salts M.D.   On: 10/06/2023 14:47   US RENAL  Result Date: 10/06/2023 CLINICAL DATA:  "Hydronephrosis".   Lymphoma. EXAM: RENAL / URINARY TRACT ULTRASOUND COMPLETE COMPARISON:  CT 09/28/2023 FINDINGS: Right Kidney: Renal measurements: 10.1 x 4.8 x 6.0 cm = volume: 150 mL. Normal echogenicity. Mild hydronephrosis. Left Kidney: Renal measurements: 12.0 x 5.4 x 5.7 cm = volume: 182 mL. Normal echogenicity. Mild hydronephrosis. Bladder: Foley catheter within. Other: None. IMPRESSION: Similar mild bilateral hydronephrosis. Electronically Signed   By: Jeronimo Greaves M.D.   On: 10/06/2023 09:31   DG CHEST PORT 1 VIEW  Result Date: 10/06/2023 CLINICAL DATA:  Pneumonia and shortness of breath EXAM: PORTABLE CHEST 1 VIEW COMPARISON:  09/28/2023 FINDINGS: Right Port-A-Cath tip at superior caval/atrial junction. Midline trachea. Patient rotated minimally right. Borderline cardiomegaly. Tortuous thoracic aorta. No pleural effusion or pneumothorax. Mild/subtle density projecting along the  left heart border and lateral left hemidiaphragm. Right lung clear. IMPRESSION: Subtle density projecting along the left heart border and left hemidiaphragm. Favored to represent interval atelectasis. Given the clinical history, early or resolving pneumonia could look similar. Depending on clinical concern, consider radiographic follow-up at 5-7 days. Electronically Signed   By: Jeronimo Greaves M.D.   On: 10/06/2023 09:25   DG C-Arm 1-60 Min-No Report  Result Date: 10/05/2023 Fluoroscopy was utilized by the requesting physician.  No radiographic interpretation.   DG OR UROLOGY CYSTO IMAGE ([facility] ONLY)  Result Date: 10/05/2023 There is no interpretation for this exam.  This order is for images obtained during a surgical procedure.  Please See "Surgeries" Tab for more information regarding the procedure.    ------ Roby Lofts, MD Resident Physician [facility]    Please contact the urology consult pager with any further questions/concerns.

## 2023-10-07 NOTE — Progress Notes (Signed)
HOSPITALIST ROUNDING NOTE Jerry Tran UJW:[id number]  DOB: 1946/05/18  DOA: 09/28/2023  PCP: [facility]  10/07/2023,1:01 PM   LOS: 9 days      Code Status: full  From:  home    Current Dispo: unclear     75 bm known PTSD Prostate ca + XRT--Mantel cell lymphoma diagnosed 11/2021 admission during hopsitalization--- follows currently with Dr. Kale/Dr. Basilio [place] and has had XRT Underlying HFpEF  60-65% echo 10/25/22 DD on echo   Previous H. pylori infection 10/2021 Paroxysmal A-fib HTN HLD He has some subacute retention and has refused in the past indwelling Foley  He developed relapsed mantle cell lymphoma was seen in the ED 09/20/2023 for increasing lymphedema in the setting of increased fluctuance he of lymph nodes in the groin etc.--- he is Protunitib with no toxicity--- second opinion from oncologist Dr. Adin Hector underway for car T-cell therapies?  Bite trial  Initial presentation 11/29 with progressing shortness of breath leg groin swelling in the setting of severe AKI with BUN/creatinine >120 and 3 CT scan progressive adenopathy new bilateral hydroureteronephrosis secondary to compressive effect increased bladder thickening as well as anasarca Patient was admitted oncology urology consulted 12/6 Ureteric stent placed for extrinsic compression  12/7 early a.m. more tachypneic needing oxygen increased respiratory rate-/ENAL [place] mild hydronephrosis bilaterally Foley catheter in place--VQ scan turned out positive for 3 small PEs, lower extremity duplex positive for bilateral DVTs Nephrology consulted feels like he has acute kidney injury on CKD 3 AA combination obstructive/ischemic ATN from hypotension 12/8 CBI started secondary to thick hematuria  Plan  Dyspnea secondary to 3 small PEs on VQ scan 12/7-concern for possible aspiration pneumonia additionally Continue heparin gtt., continue Unasyn and narrow today to Augmentin course completion 12/9-need desat screen Overall  improved not requiring oxygen  Worsening ATN secondary to extrinsic compression +/- obstructive/ischemic ATN Renal function marginally improved, antihypertensives held, hopeful for recovery with CBT in addition to management by nephrology-appreciate input Given Lasix today we will hold fluids we will reassess with urine output picking up hopefully is out of ATN phase  Mantle cell lymphoma metastatic was supposed to have second opinion with [facility] oncology Continues on prednisone 40--received R-GemOx 12/3--main side effect is heme toxicities like TCP etc Will involve oncologist Dr. Jonelle Sidle seen by him 10/02/23  Paroxysmal A-fib CHADVASC >4 not on anticoagulation Off metoprolol secondary to hypotension which may have precipitated ATN On monitors currently sinus, sinus with PVCs  Massive anasarca HFpEF -3.9 L urine output continuing to pick up-hopeful for recovery  He is a full code palliative care has seen him last seen on 12/5 we are hopeful for recovery I will discuss with his wife implications if he does not improve his kidney function we will get nephrology involved  DVT prophylaxis: SCD  Status is: Inpatient Remains inpatient appropriate because:   Requires durable improvement of renal function  Subjective:  Looks much better Overall just not hungry not eating much No pain fever chills nausea CBI in process urine seems to be more clear  Objective + exam Vitals:   10/06/23 1256 10/06/23 1950 10/07/23 0607 10/07/23 0917  BP: (!) 95/53 (!) 114/59 91/67 97/64   Pulse: 83 78 86 98  Resp: 16 18 (!) 22 16  Temp: 98 F (36.7 C) 97.7 F (36.5 C) (!) 97.4 F (36.3 C) 97.9 F (36.6 C)  TempSrc: Oral Oral Oral Oral  SpO2: 97% 98% 98% 99%  Weight:      Height:  Filed Weights   09/28/23 2012 09/29/23 1312 10/05/23 1340  Weight: 104.3 kg 104.7 kg 104.7 kg    Examination:  EOMI NCAT no focal deficit no icterus no pallor Chest clear no wheeze rales  rhonchi Lower extremity edema however anasarca seemed a little less I did not examine scrotum Euthymic coherent moving 4 limbs equally   Data Reviewed: reviewed   CBC    Component Value Date/Time   WBC 8.4 10/07/2023 0113   RBC 2.71 (L) 10/07/2023 0113   HGB 7.8 (L) 10/07/2023 0113   HGB 9.5 (L) 09/26/2023 1529   HCT 23.2 (L) 10/07/2023 0113   PLT 58 (L) 10/07/2023 0113   PLT 114 (L) 09/26/2023 1529   MCV 85.6 10/07/2023 0113   MCH 28.8 10/07/2023 0113   MCHC 33.6 10/07/2023 0113   RDW 17.4 (H) 10/07/2023 0113   LYMPHSABS 0.5 (L) 09/28/2023 2038   MONOABS 0.4 09/28/2023 2038   EOSABS 0.0 09/28/2023 2038   BASOSABS 0.0 09/28/2023 2038      Latest Ref Rng & Units 10/07/2023    1:13 AM 10/06/2023    3:38 AM 10/05/2023    3:04 AM  CMP  Glucose 70 - 99 mg/dL 70 - 99 mg/dL 191    478  295  91   BUN 8 - 23 mg/dL 8 - 23 mg/dL 97    621  93  81   Creatinine 0.61 - 1.24 mg/dL 3.08 - 6.57 mg/dL 8.46    9.62  9.52  8.41   Sodium 135 - 145 mmol/L 135 - 145 mmol/L 133    134  131  135   Potassium 3.5 - 5.1 mmol/L 3.5 - 5.1 mmol/L 4.1    4.1  4.3  4.2   Chloride 98 - 111 mmol/L 98 - 111 mmol/L 102    103  104  106   CO2 22 - 32 mmol/L 22 - 32 mmol/L 19    19  15  20    Calcium 8.9 - 10.3 mg/dL 8.9 - 32.4 mg/dL 8.1    8.1  8.7  9.4      Scheduled Meds:  atorvastatin  10 mg Oral QHS   Chlorhexidine Gluconate Cloth  6 each Topical Daily   docusate sodium  100 mg Oral BID   multivitamin with minerals  1 tablet Oral Daily   oxybutynin  10 mg Oral QHS   pantoprazole  40 mg Oral Daily   predniSONE  40 mg Oral Q breakfast   senna  1 tablet Oral BID   sodium chloride flush  3 mL Intravenous Q12H   Continuous Infusions:  ampicillin-sulbactam (UNASYN) IV 3 g (10/07/23 1220)   heparin 1,500 Units/hr (10/07/23 0340)   sodium chloride irrigation      Time  55  Rhetta Mura, MD  [facility]

## 2023-10-07 NOTE — Plan of Care (Signed)
Shift summary: Foley exchanged by urology d/t poor draining. CBI initiated. Has since been irrigated twice d/t clot burden. 1 dose of IV Lasix given. Pt encouraged to ambulated to the bathroom and perform personal hygiene. Tolerated well. Back to [facility]. Problem: Education: Goal: Knowledge of General Education information will improve Description: Including pain rating scale, medication(s)/side effects and non-pharmacologic comfort measures 10/07/2023 1723 by Loistine Simas, RN Outcome: Progressing 10/07/2023 1649 by Loistine Simas, RN Outcome: Progressing   Problem: Health Behavior/Discharge Planning: Goal: Ability to manage health-related needs will improve 10/07/2023 1723 by Loistine Simas, RN Outcome: Progressing 10/07/2023 1649 by Loistine Simas, RN Outcome: Progressing   Problem: Clinical Measurements: Goal: Ability to maintain clinical measurements within normal limits will improve 10/07/2023 1723 by Loistine Simas, RN Outcome: Progressing 10/07/2023 1649 by Loistine Simas, RN Outcome: Progressing Goal: Will remain free from infection 10/07/2023 1723 by Loistine Simas, RN Outcome: Progressing 10/07/2023 1649 by Loistine Simas, RN Outcome: Progressing Goal: Diagnostic test results will improve 10/07/2023 1723 by Loistine Simas, RN Outcome: Progressing 10/07/2023 1649 by Loistine Simas, RN Outcome: Progressing Goal: Respiratory complications will improve 10/07/2023 1723 by Loistine Simas, RN Outcome: Progressing 10/07/2023 1649 by Loistine Simas, RN Outcome: Progressing   Problem: Activity: Goal: Risk for activity intolerance will decrease 10/07/2023 1723 by Loistine Simas, RN Outcome: Progressing 10/07/2023 1649 by Loistine Simas, RN Outcome: Progressing   Problem: Nutrition: Goal: Adequate nutrition will be maintained 10/07/2023 1723 by Loistine Simas, RN Outcome: Progressing 10/07/2023 1649 by  Loistine Simas, RN Outcome: Progressing   Problem: Coping: Goal: Level of anxiety will decrease 10/07/2023 1723 by Loistine Simas, RN Outcome: Progressing 10/07/2023 1649 by Loistine Simas, RN Outcome: Progressing   Problem: Elimination: Goal: Will not experience complications related to bowel motility 10/07/2023 1723 by Loistine Simas, RN Outcome: Progressing 10/07/2023 1649 by Loistine Simas, RN Outcome: Progressing   Problem: Pain Management: Goal: General experience of comfort will improve 10/07/2023 1723 by Loistine Simas, RN Outcome: Progressing 10/07/2023 1649 by Loistine Simas, RN Outcome: Progressing   Problem: Safety: Goal: Ability to remain free from injury will improve 10/07/2023 1723 by Loistine Simas, RN Outcome: Progressing 10/07/2023 1649 by Loistine Simas, RN Outcome: Progressing   Problem: Skin Integrity: Goal: Risk for impaired skin integrity will decrease 10/07/2023 1723 by Loistine Simas, RN Outcome: Progressing 10/07/2023 1649 by Loistine Simas, RN Outcome: Progressing   Problem: Clinical Measurements: Goal: Cardiovascular complication will be avoided 10/07/2023 1723 by Loistine Simas, RN Outcome: Not Progressing 10/07/2023 1649 by Loistine Simas, RN Outcome: Progressing   Problem: Elimination: Goal: Will not experience complications related to urinary retention 10/07/2023 1723 by Loistine Simas, RN Outcome: Not Progressing 10/07/2023 1649 by Loistine Simas, RN Outcome: Progressing

## 2023-10-07 NOTE — Progress Notes (Signed)
PHARMACY - ANTICOAGULATION CONSULT NOTE  Pharmacy Consult for Heparin Indication: pulmonary embolus  Allergies  Allergen Reactions   Sildenafil Other (See Comments)    Unknown reaction - reported by [facility]    Patient Measurements: Height: 6\' 4"  (193 cm) Weight: 104.7 kg (230 lb 13.2 oz) IBW/kg (Calculated) : 86.8 Heparin Dosing Weight:  104.7  Vital Signs: Temp: 97.6 F (36.4 C) (12/08 2152) Temp Source: Oral (12/08 2152) BP: 91/63 (12/08 2152) Pulse Rate: 90 (12/08 2152)  Labs: Recent Labs    10/05/23 0304 10/06/23 0338 10/07/23 0113 10/07/23 1551 10/07/23 1557 10/07/23 2250  HGB 8.4* 8.1* 7.8*  --  7.7*  --   HCT 26.9* 25.3* 23.2*  --  23.2*  --   PLT 132* 85* 58*  --   --   --   HEPARINUNFRC  --   --  0.93* 0.76*  --  0.57  CREATININE 3.92* 5.12* 4.75*  4.85*  --   --   --     Estimated Creatinine Clearance: 17 mL/min (A) (by C-G formula based on SCr of 4.85 mg/dL (H)).   Medical History: Past Medical History:  Diagnosis Date   Arthritis    Chronic kidney disease, stage 3b (HCC) 12/09/2021   Dyslipidemia    GERD (gastroesophageal reflux disease)    Hypertension    PAF (paroxysmal atrial fibrillation) (HCC)    Prostate cancer (HCC) 03/2021   s/p radiation therapy   PTSD (post-traumatic stress disorder)     Assessment: 77 yo M with +PE per VQ scan. Hgb 8.1 trending down daily. Plts only 85. Pharmacy consulted to start IV heparin.   10/07/2023: 2250 Heparin level 0.57- therapeutic on IV heparin 1400 units/hr.  CBC: Hg 7.7, pltc 58- low and trending down.  >50% fall in plt since admission however timing and other risk factors make HITT unlikely.  Will continue to monitor.   RN notes he continues to have bleeding from Foley catheter that is unchanged.   Scr 4.85- elevated but now trending down No infusion interruptions reported by RN  Goal of Therapy:  Heparin level 0.3-0.7 units/ml Monitor platelets by anticoagulation protocol: Yes   Plan:  Continue  IV heparin at 1400 units/hr Recheck confirmatory heparin level in 8h. Daily HL and CBC while on heparin   Junita Push PharmD 10/07/2023,11:15 PM

## 2023-10-08 ENCOUNTER — Encounter (HOSPITAL_COMMUNITY): Payer: Self-pay | Admitting: Urology

## 2023-10-08 ENCOUNTER — Other Ambulatory Visit: Payer: Medicare Other

## 2023-10-08 ENCOUNTER — Ambulatory Visit: Payer: Medicare Other

## 2023-10-08 ENCOUNTER — Ambulatory Visit: Payer: Medicare Other | Admitting: Hematology

## 2023-10-08 DIAGNOSIS — C8318 Mantle cell lymphoma, lymph nodes of multiple sites: Secondary | ICD-10-CM | POA: Diagnosis not present

## 2023-10-08 DIAGNOSIS — D61818 Other pancytopenia: Secondary | ICD-10-CM | POA: Diagnosis not present

## 2023-10-08 DIAGNOSIS — Z7189 Other specified counseling: Secondary | ICD-10-CM | POA: Diagnosis not present

## 2023-10-08 DIAGNOSIS — E440 Moderate protein-calorie malnutrition: Secondary | ICD-10-CM | POA: Insufficient documentation

## 2023-10-08 DIAGNOSIS — N179 Acute kidney failure, unspecified: Secondary | ICD-10-CM | POA: Diagnosis not present

## 2023-10-08 LAB — RENAL FUNCTION PANEL
Albumin: 2.7 g/dL — ABNORMAL LOW (ref 3.5–5.0)
Anion gap: 16 — ABNORMAL HIGH (ref 5–15)
BUN: 97 mg/dL — ABNORMAL HIGH (ref 8–23)
CO2: 14 mmol/L — ABNORMAL LOW (ref 22–32)
Calcium: 7.9 mg/dL — ABNORMAL LOW (ref 8.9–10.3)
Chloride: 98 mmol/L (ref 98–111)
Creatinine, Ser: 4.12 mg/dL — ABNORMAL HIGH (ref 0.61–1.24)
GFR, Estimated: 14 mL/min — ABNORMAL LOW (ref >60)
Glucose, Bld: 174 mg/dL — ABNORMAL HIGH (ref 70–99)
Phosphorus: 6.5 mg/dL — ABNORMAL HIGH (ref 2.5–4.6)
Potassium: 3.7 mmol/L (ref 3.5–5.1)
Sodium: 128 mmol/L — ABNORMAL LOW (ref 135–145)

## 2023-10-08 LAB — URINE CULTURE: Culture: 80000 — AB

## 2023-10-08 LAB — HEPARIN LEVEL (UNFRACTIONATED)
Heparin Unfractionated: <0.1 [IU]/mL — ABNORMAL LOW (ref 0.30–0.70)
Heparin Unfractionated: 0.58 [IU]/mL (ref 0.30–0.70)

## 2023-10-08 MED ORDER — ENSURE ENLIVE PO LIQD
237.0000 mL | Freq: Two times a day (BID) | ORAL | Status: DC
  Administered 2023-10-12 – 2023-10-22 (×7): 237 mL via ORAL

## 2023-10-08 MED ORDER — CIPROFLOXACIN HCL 500 MG PO TABS
500.0000 mg | ORAL_TABLET | Freq: Every day | ORAL | Status: AC
  Administered 2023-10-08 – 2023-10-09 (×2): 500 mg via ORAL
  Filled 2023-10-08 (×3): qty 1

## 2023-10-08 MED ORDER — CIPROFLOXACIN HCL 500 MG PO TABS: 500.0000 mg | ORAL_TABLET | Freq: Every day | ORAL | Status: DC

## 2023-10-08 MED ORDER — FUROSEMIDE 10 MG/ML IJ SOLN
80.0000 mg | Freq: Once | INTRAMUSCULAR | Status: AC
  Administered 2023-10-08: 80 mg via INTRAVENOUS
  Filled 2023-10-08: qty 8

## 2023-10-08 MED ORDER — SODIUM BICARBONATE 650 MG PO TABS
650.0000 mg | ORAL_TABLET | Freq: Two times a day (BID) | ORAL | Status: DC
  Administered 2023-10-08 – 2023-10-22 (×28): 650 mg via ORAL
  Filled 2023-10-08 (×29): qty 1

## 2023-10-08 NOTE — Progress Notes (Addendum)
3 Days Post-Op Subjective: Pt was sleeping on rounds. I did not wake him. CBI on very slow gtt, pink lemonade irrigant in tubing. No clot material present in tubing.   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 Alvester Morin on 12/6  # Penoscrotal edema Diffuse anasarca.  Recommend keeping scrotum/penis elevated.  If his fluid retention can be maintained this will be self-limiting.  Patient reports no difficulty urinating and understands that if at any point he is unable to urinate appropriately he needs to notify this provider so the Foley catheter can be placed.  #Hematuria #Pulmonary embolism 3 filling defects present on VQ scan, consistent with PE. As these appear to be subsegmental, hopefully patient won't need to be on anticoagulation long.  Hematuria 2/2 heparin infusion in the context of indwelling ureteral stents.  50f hematuria catheter placed 12/8.  PRN hand irrigation  Intake/Output from previous day: 12/08 0701 - 12/09 0700 In: 4600.1 [P.O.:1010; I.V.:390.1; IV Piggyback:200] Out: 01027 [Urine:11850]  Intake/Output this shift: No intake/output data recorded.  Physical Exam:  General: Sleeping CV: No cyanosis Lungs: equal chest rise Gu: 9f three-way hematuria catheter in place.  CBI on low gtt. draining pink lemonade colored irrigant.  No clot material present  Lab Results: Recent Labs    10/06/23 0338 10/07/23 0113 10/07/23 1557  HGB 8.1* 7.8* 7.7*  HCT 25.3* 23.2* 23.2*   BMET Recent Labs    10/07/23 0113 10/08/23 0555  NA 134*  133* 128*  K 4.1  4.1 3.7  CL 103  102 98  CO2 19*  19* 14*  GLUCOSE 104*   106* 174*  BUN 104*  97* 97*  CREATININE 4.75*  4.85* 4.12*  CALCIUM 8.1*  8.1* 7.9*     Studies/Results: VAS [place] LOWER EXTREMITY VENOUS (DVT)  Result Date: 10/07/2023  Lower Venous DVT Study Patient Name:  Jerry Tran  Date of Exam:   10/06/2023 Medical Rec #: [id number]     Accession #:    [id number] Date of Birth: 04/03/1946     Patient Gender: M Patient Age:   77 years Exam Location:  [facility] Procedure:      VAS [place] LOWER EXTREMITY VENOUS (DVT) Referring Phys: Rhetta Mura --------------------------------------------------------------------------------  Indications: Swelling, and pulmonary embolism.  Risk Factors: Cancer. Limitations: Body habitus and poor ultrasound/tissue interface. Comparison Study: No prior studies. Performing Technologist: Chanda Busing RVT  Examination Guidelines: A complete evaluation includes B-mode imaging, spectral Doppler, color Doppler, and power Doppler as needed of all accessible portions of each vessel. Bilateral testing is considered an integral part of a complete examination. Limited examinations for reoccurring indications may be performed as noted. The reflux portion of the exam is performed with the patient in reverse Trendelenburg.  +---------+---------------+---------+-----------+----------+-------------------+ RIGHT    CompressibilityPhasicitySpontaneityPropertiesThrombus Aging      +---------+---------------+---------+-----------+----------+-------------------+ CFV      Partial        Yes      Yes                  Acute               +---------+---------------+---------+-----------+----------+-------------------+ FV Prox  None  No       No                   Acute               +---------+---------------+---------+-----------+----------+-------------------+ FV Mid   None           No       No                   Acute                +---------+---------------+---------+-----------+----------+-------------------+ FV DistalNone           No       No                   Acute               +---------+---------------+---------+-----------+----------+-------------------+ PFV                                                   Not well visualized +---------+---------------+---------+-----------+----------+-------------------+ POP      Full           Yes      Yes                                      +---------+---------------+---------+-----------+----------+-------------------+ PTV      Full                                                             +---------+---------------+---------+-----------+----------+-------------------+ PERO     Full                                                             +---------+---------------+---------+-----------+----------+-------------------+ EIV                     Yes      Yes                                      +---------+---------------+---------+-----------+----------+-------------------+ The EIV appears patent.  +---------+---------------+---------+-----------+----------+--------------+ LEFT     CompressibilityPhasicitySpontaneityPropertiesThrombus Aging +---------+---------------+---------+-----------+----------+--------------+ CFV      Full           Yes      Yes                                 +---------+---------------+---------+-----------+----------+--------------+ SFJ      Full                                                        +---------+---------------+---------+-----------+----------+--------------+  FV Prox  Full                                                        +---------+---------------+---------+-----------+----------+--------------+ FV Mid   Full                                                        +---------+---------------+---------+-----------+----------+--------------+ FV DistalFull                                                         +---------+---------------+---------+-----------+----------+--------------+ PFV      Full                                                        +---------+---------------+---------+-----------+----------+--------------+ POP      None           No       No                   Acute          +---------+---------------+---------+-----------+----------+--------------+ PTV      Full                                                        +---------+---------------+---------+-----------+----------+--------------+ PERO     Full                                                        +---------+---------------+---------+-----------+----------+--------------+ Gastroc  Full                                                        +---------+---------------+---------+-----------+----------+--------------+     Summary: RIGHT: - Findings consistent with acute deep vein thrombosis involving the right common femoral vein, and right femoral vein.  - No cystic structure found in the popliteal fossa.  LEFT: - Findings consistent with acute deep vein thrombosis involving the left popliteal vein.  - No cystic structure found in the popliteal fossa.  *See table(s) above for measurements and observations. Electronically signed by Carolynn Sayers on 10/07/2023 at 8:57:49 AM.    Final    NM Pulmonary Perfusion  Result Date: 10/06/2023 CLINICAL DATA:  Aggressive shortness of breath. Left groin swelling. Severe acute kidney injury. Mantle cell lymphoma. EXAM: NUCLEAR MEDICINE PERFUSION LUNG SCAN TECHNIQUE: Perfusion images were obtained  in multiple projections after intravenous injection of radiopharmaceutical. Ventilation scans intentionally deferred if perfusion scan and chest x-ray adequate for interpretation during COVID 19 epidemic. RADIOPHARMACEUTICALS:  3.95 mCi Tc-79m MAA IV COMPARISON:  Portable chest obtained earlier today. FINDINGS: Moderate-sized subsegmental  perfusion defects in the anteromedial left upper lobe, lateral right lower lobe and anteromedial right middle lobe. No corresponding abnormality at these locations on the chest radiograph. IMPRESSION: Three moderate-sized subsegmental perfusion defects with no corresponding radiographic abnormality, compatible with pulmonary embolism. Electronically Signed   By: Beckie Salts M.D.   On: 10/06/2023 14:47   US RENAL  Result Date: 10/06/2023 CLINICAL DATA:  "Hydronephrosis".  Lymphoma. EXAM: RENAL / URINARY TRACT ULTRASOUND COMPLETE COMPARISON:  CT 09/28/2023 FINDINGS: Right Kidney: Renal measurements: 10.1 x 4.8 x 6.0 cm = volume: 150 mL. Normal echogenicity. Mild hydronephrosis. Left Kidney: Renal measurements: 12.0 x 5.4 x 5.7 cm = volume: 182 mL. Normal echogenicity. Mild hydronephrosis. Bladder: Foley catheter within. Other: None. IMPRESSION: Similar mild bilateral hydronephrosis. Electronically Signed   By: Jeronimo Greaves M.D.   On: 10/06/2023 09:31      LOS: 10 days   Elmon Kirschner, NP [facility] Pager: [phone]  10/08/2023, 9:08 AM

## 2023-10-08 NOTE — Progress Notes (Signed)
PHARMACY - ANTICOAGULATION CONSULT NOTE  Pharmacy Consult for Heparin Indication: pulmonary embolus  Allergies  Allergen Reactions   Sildenafil Other (See Comments)    Unknown reaction - reported by [facility]    Patient Measurements: Height: 6\' 4"  (193 cm) Weight: 104.7 kg (230 lb 13.2 oz) IBW/kg (Calculated) : 86.8 Heparin Dosing Weight:  104.7  Vital Signs: Temp: 97.6 F (36.4 C) (12/08 2152) Temp Source: Oral (12/08 2152) BP: 91/63 (12/08 2152) Pulse Rate: 90 (12/08 2152)  Labs: Recent Labs    10/06/23 0338 10/07/23 0113 10/07/23 1551 10/07/23 1557 10/07/23 2250 10/08/23 0555 10/08/23 0635  HGB 8.1* 7.8*  --  7.7*  --   --   --   HCT 25.3* 23.2*  --  23.2*  --   --   --   PLT 85* 58*  --   --   --   --   --   HEPARINUNFRC  --  0.93*   < >  --  0.57 <0.10* 0.58  CREATININE 5.12* 4.75*  4.85*  --   --   --  4.12*  --    < > = values in this interval not displayed.    Estimated Creatinine Clearance: 20 mL/min (A) (by C-G formula based on SCr of 4.12 mg/dL (H)).  Assessment: 77 yo M with +PE per VQ scan &  B DVT on heparin per pharmacy.   10/08/2023: AM Heparin level 0.58- remains therapeutic on IV heparin 1400 units/hr.  CBC drawn 7/8 PM: Hg 7.7, pltc 58- low and trending down.  >50% fall in plt since admission however timing and other risk factors make HITT unlikely.  Will continue to monitor.   No AM CBC 12/9 Foley catheter exchanged yesterday by urology & CBI started. Irrigated x 2 per [employer] note for clot burden.   Scr 4.12 elevated but trending down No infusion interruptions reported by RN  Goal of Therapy:  Heparin level 0.3-0.7 units/ml Monitor platelets by anticoagulation protocol: Yes   Plan:  Continue IV heparin at 1400 units/hr Daily HL and CBC while on heparin   Herby Abraham, Pharm.D Use secure chat for questions 10/08/2023 7:54 AM

## 2023-10-08 NOTE — Progress Notes (Signed)
Jerry Tran   DOB:08/26/1946   GM#:[id number]    Hematology oncology inpatient progress note Date of service 10/08/2023  Subjective:   Patient's wife is present at bedside. Pt notes that his breathing worsened during that weekend, where he had an VQ scan showing Pulmonary embolism. He notes that his breathing has improved since weekend.   He complains of bilateral lower extremity swelling, hematuria, and mild appetite loss.   Objective:  Vitals:   10/07/23 2152 10/08/23 1316  BP: 91/63 92/79  Pulse: 90 93  Resp: 17   Temp: 97.6 F (36.4 C) 97.9 F (36.6 C)  SpO2: 100% 98%     Intake/Output Summary (Last 24 hours) at 10/08/2023 1546 Last data filed at 10/08/2023 1300 Gross per 24 hour  Intake 3749.11 ml  Output 64403 ml  Net -7900.89 ml     REVIEW OF SYSTEMS:   .10 Point review of Systems was done is negative except as noted above.   PHYSICAL EXAMINATION: ECOG PERFORMANCE STATUS: 3 - Symptomatic, >50% confined to bed  Vitals:   10/07/23 2152 10/08/23 1316  BP: 91/63 92/79  Pulse: 90 93  Resp: 17   Temp: 97.6 F (36.4 C) 97.9 F (36.6 C)  SpO2: 100% 98%   Filed Weights   09/28/23 2012 09/29/23 1312 10/05/23 1340  Weight: 104.3 kg 104.7 kg 104.7 kg    GENERAL:alert, no distress and comfortable LUNGS: clear to auscultation and percussion with normal breathing effort HEART: regular rate & rhythm and no murmurs and no lower extremity edema ABDOMEN:abdomen soft, non-tender and normal bowel sounds Musculoskeletal: Bilateral 2-3+ pitting pedal edema lower extremities bilaterally PSYCH: alert & oriented x 3 with fluent speech    Labs Reviewed: Marland Kitchen    Latest Ref Rng & Units 10/02/2023    4:17 AM 10/01/2023    3:59 AM  CBC  WBC 4.0 - 10.5 K/uL 3.4  3.6   Hemoglobin 13.0 - 17.0 g/dL 8.7  9.4   Hematocrit 47.4 - 52.0 % 28.5  30.7   Platelets 150 - 400 K/uL 147  146    .    Latest Ref Rng & Units 10/02/2023    4:17 AM 10/01/2023    3:59 AM  CMP  Glucose 70 - 99  mg/dL 259  94   BUN 8 - 23 mg/dL 63  70   Creatinine 5.63 - 1.24 mg/dL 8.75  6.43   Sodium 329 - 145 mmol/L 135  136   Potassium 3.5 - 5.1 mmol/L 3.6  4.3   Chloride 98 - 111 mmol/L 109  107   CO2 22 - 32 mmol/L 18  20   Calcium 8.9 - 10.3 mg/dL 9.0  9.5      Studies Reviewed:  VAS [place] LOWER EXTREMITY VENOUS (DVT)  Result Date: 10/07/2023  Lower Venous DVT Study Patient Name:  Jerry Tran  Date of Exam:   10/06/2023 Medical Rec #: [id number]     Accession #:    [id number] Date of Birth: [birth year]     Patient Gender: M Patient Age:   77 years Exam Location:  [facility] Procedure:      VAS [place] LOWER EXTREMITY VENOUS (DVT) Referring Phys: Rhetta Mura --------------------------------------------------------------------------------  Indications: Swelling, and pulmonary embolism.  Risk Factors: Cancer. Limitations: Body habitus and poor ultrasound/tissue interface. Comparison Study: No prior studies. Performing Technologist: Chanda Busing RVT  Examination Guidelines: A complete evaluation includes B-mode imaging, spectral Doppler, color Doppler, and power Doppler as needed of all accessible portions of  each vessel. Bilateral testing is considered an integral part of a complete examination. Limited examinations for reoccurring indications may be performed as noted. The reflux portion of the exam is performed with the patient in reverse Trendelenburg.  +---------+---------------+---------+-----------+----------+-------------------+ RIGHT    CompressibilityPhasicitySpontaneityPropertiesThrombus Aging      +---------+---------------+---------+-----------+----------+-------------------+ CFV      Partial        Yes      Yes                  Acute               +---------+---------------+---------+-----------+----------+-------------------+ FV Prox  None           No       No                   Acute                +---------+---------------+---------+-----------+----------+-------------------+ FV Mid   None           No       No                   Acute               +---------+---------------+---------+-----------+----------+-------------------+ FV DistalNone           No       No                   Acute               +---------+---------------+---------+-----------+----------+-------------------+ PFV                                                   Not well visualized +---------+---------------+---------+-----------+----------+-------------------+ POP      Full           Yes      Yes                                      +---------+---------------+---------+-----------+----------+-------------------+ PTV      Full                                                             +---------+---------------+---------+-----------+----------+-------------------+ PERO     Full                                                             +---------+---------------+---------+-----------+----------+-------------------+ EIV                     Yes      Yes                                      +---------+---------------+---------+-----------+----------+-------------------+ The EIV appears patent.  +---------+---------------+---------+-----------+----------+--------------+ LEFT  CompressibilityPhasicitySpontaneityPropertiesThrombus Aging +---------+---------------+---------+-----------+----------+--------------+ CFV      Full           Yes      Yes                                 +---------+---------------+---------+-----------+----------+--------------+ SFJ      Full                                                        +---------+---------------+---------+-----------+----------+--------------+ FV Prox  Full                                                        +---------+---------------+---------+-----------+----------+--------------+ FV Mid   Full                                                         +---------+---------------+---------+-----------+----------+--------------+ FV DistalFull                                                        +---------+---------------+---------+-----------+----------+--------------+ PFV      Full                                                        +---------+---------------+---------+-----------+----------+--------------+ POP      None           No       No                   Acute          +---------+---------------+---------+-----------+----------+--------------+ PTV      Full                                                        +---------+---------------+---------+-----------+----------+--------------+ PERO     Full                                                        +---------+---------------+---------+-----------+----------+--------------+ Gastroc  Full                                                        +---------+---------------+---------+-----------+----------+--------------+  Summary: RIGHT: - Findings consistent with acute deep vein thrombosis involving the right common femoral vein, and right femoral vein.  - No cystic structure found in the popliteal fossa.  LEFT: - Findings consistent with acute deep vein thrombosis involving the left popliteal vein.  - No cystic structure found in the popliteal fossa.  *See table(s) above for measurements and observations. Electronically signed by Carolynn Sayers on 10/07/2023 at 8:57:49 AM.    Final    NM Pulmonary Perfusion  Result Date: 10/06/2023 CLINICAL DATA:  Aggressive shortness of breath. Left groin swelling. Severe acute kidney injury. Mantle cell lymphoma. EXAM: NUCLEAR MEDICINE PERFUSION LUNG SCAN TECHNIQUE: Perfusion images were obtained in multiple projections after intravenous injection of radiopharmaceutical. Ventilation scans intentionally deferred if perfusion scan and chest x-ray adequate for interpretation  during COVID 19 epidemic. RADIOPHARMACEUTICALS:  3.95 mCi Tc-27m MAA IV COMPARISON:  Portable chest obtained earlier today. FINDINGS: Moderate-sized subsegmental perfusion defects in the anteromedial left upper lobe, lateral right lower lobe and anteromedial right middle lobe. No corresponding abnormality at these locations on the chest radiograph. IMPRESSION: Three moderate-sized subsegmental perfusion defects with no corresponding radiographic abnormality, compatible with pulmonary embolism. Electronically Signed   By: Beckie Salts M.D.   On: 10/06/2023 14:47   US RENAL  Result Date: 10/06/2023 CLINICAL DATA:  "Hydronephrosis".  Lymphoma. EXAM: RENAL / URINARY TRACT ULTRASOUND COMPLETE COMPARISON:  CT 09/28/2023 FINDINGS: Right Kidney: Renal measurements: 10.1 x 4.8 x 6.0 cm = volume: 150 mL. Normal echogenicity. Mild hydronephrosis. Left Kidney: Renal measurements: 12.0 x 5.4 x 5.7 cm = volume: 182 mL. Normal echogenicity. Mild hydronephrosis. Bladder: Foley catheter within. Other: None. IMPRESSION: Similar mild bilateral hydronephrosis. Electronically Signed   By: Jeronimo Greaves M.D.   On: 10/06/2023 09:31   DG CHEST PORT 1 VIEW  Result Date: 10/06/2023 CLINICAL DATA:  Pneumonia and shortness of breath EXAM: PORTABLE CHEST 1 VIEW COMPARISON:  09/28/2023 FINDINGS: Right Port-A-Cath tip at superior caval/atrial junction. Midline trachea. Patient rotated minimally right. Borderline cardiomegaly. Tortuous thoracic aorta. No pleural effusion or pneumothorax. Mild/subtle density projecting along the left heart border and lateral left hemidiaphragm. Right lung clear. IMPRESSION: Subtle density projecting along the left heart border and left hemidiaphragm. Favored to represent interval atelectasis. Given the clinical history, early or resolving pneumonia could look similar. Depending on clinical concern, consider radiographic follow-up at 5-7 days. Electronically Signed   By: Jeronimo Greaves M.D.   On: 10/06/2023  09:25   DG C-Arm 1-60 Min-No Report  Result Date: 10/05/2023 Fluoroscopy was utilized by the requesting physician.  No radiographic interpretation.   DG OR UROLOGY CYSTO IMAGE ([facility] ONLY)  Result Date: 10/05/2023 There is no interpretation for this exam.  This order is for images obtained during a surgical procedure.  Please See "Surgeries" Tab for more information regarding the procedure.   CT ABDOMEN PELVIS WO CONTRAST  Result Date: 09/28/2023 CLINICAL DATA:  Lower extremity and groin swelling for 2 weeks with shortness of breath. Most recent PET-CT with extensive bulky abdominopelvic adenopathy. Currently undergoing XRT for lymphoma. EXAM: CT ABDOMEN AND PELVIS WITHOUT CONTRAST TECHNIQUE: Multidetector CT imaging of the abdomen and pelvis was performed following the standard protocol without IV contrast. RADIATION DOSE REDUCTION: This exam was performed according to the departmental dose-optimization program which includes automated exposure control, adjustment of the mA and/or kV according to patient size and/or use of iterative reconstruction technique. COMPARISON:  PET-CT 08/03/2023, CT chest, abdomen and pelvis with IV contrast 05/29/2023. FINDINGS: Lower chest: No acute  abnormality. No lung nodules. The cardiac size is normal. The coronary blood pool is less dense than the myocardium consistent with anemia. No pericardial effusion. Hepatobiliary: There are several small scattered hepatic cysts, with no follow-up imaging required. No new liver abnormality is seen without contrast. The gallbladder and bile ducts are unremarkable. Pancreas: Unremarkable without contrast. Spleen: Unremarkable without contrast.  No splenomegaly. Adrenals/Urinary Tract: There is no adrenal mass. No contour deforming abnormality of either unenhanced kidneys. Interval new mild bilateral hydroureteronephrosis. There is no urinary stone. This is probably due to compressive effect related to extensive progressive bilateral  retroperitoneal adenopathy. The bladder is not fully distended but does appear increasingly thickened, which could be due to cystitis, nondistention, or XRT. Stomach/Bowel: No dilatation or wall thickening is seen without contrast. There is mild-to-moderate fecal stasis. Diverticulosis is noted without focal colitis or diverticulitis. The appendix is normal. Vascular/Lymphatic: Mild aortic atherosclerosis. No AAA. Extensive progressive retroperitoneal adenopathy. There are multilevel bulky retroperitoneal lymph nodes. No adenopathy was previously seen along the aorta and IVC This is suboptimally evaluated without contrast. Example lymph nodes are as follows: New retrocaval lymph node measuring 2.7 x 1.9 cm on 2:27; New aortocaval lymph node measuring 3.2 x 3.4 cm on 2:30; Numerous other new lymph nodes around the between the IVC and aorta are also noted. There are multiple new left periaortic chain nodes, largest of these is 5.4 x 3.2 cm on 2:36. There is extensive progressive adenopathy in the pelvis. Examples include: A right common iliac chain node measuring 4.9 x 3.9 cm on 2:46, was previously 3.3 x 2.0 cm; Bulky pelvic sidewall nodes with additional progressive lymph nodes in the more distal common iliac chains, for example a left common iliac chain lymph node is estimated 4.9 x 3.5 cm on 2:53, was previously 2.1 x 1.8 cm; There are bulky bilateral external iliac chain nodes. On the right the largest is 6.1 x 4.6 cm on 2:65, previously 6 x 4.5 cm; Largest of the left external iliac chain nodes is 6.2 x 4.9 cm on 2:61, previously was 5 x 3.7 cm. Bulky bilateral inguinal adenopathy is again noted, largest slightly improved on the right, now 5.6 x 4.0 cm on 2:88, previously was 5.7 x 4.6 cm. Largest left inguinal chain node is less dense than previously possibly due to XRT, today measuring 3.9 x 3.0 cm on 2:84, was previously 5.7 x 4.8 cm. Lymph nodes in medial upper thighs there also slightly improved, on the  left measuring 3.6 x 3.0 cm on 2:110, previously 4.0 x 3.6 cm. At the same level on the right, a medial upper thigh lymph node measures 3.0 x 2.6 cm on 2:105, was previously 5.7 x 4.4 cm. 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 The prostate is not enlarged. Other: Minimal pelvic ascites. Diffuse worsening body wall anasarca which could be due to malnutrition, hepatic dysfunction, or fluid overload. There is severe penoscrotal edema, small hydroceles. Mild diffuse haziness to the mesentery is also noted but without focality. Musculoskeletal: Advanced degenerative change lumbar spine. Mild hip DJD. No metastatic bone lesion is seen. Ankylosis right SI joint. IMPRESSION: 1. Extensive progressive retroperitoneal and pelvic adenopathy, with some slightly improved inguinal and medial upper thigh adenopathy. 2. Interval new mild bilateral hydroureteronephrosis, probably due to compressive effect from new extensive retroperitoneal adenopathy. 3. Increasing bladder wall thickening which could be due to cystitis, nondistention, or XRT. 4. Constipation and diverticulosis. 5. Minimal pelvic ascites. 6. Worsening body wall anasarca  which could be due to malnutrition, hepatic dysfunction, or fluid overload. 7. Severe penoscrotal edema, small hydroceles. 8. Aortic atherosclerosis. Aortic Atherosclerosis (ICD10-I70.0). Electronically Signed   By: Almira Bar M.D.   On: 09/28/2023 21:55   DG Chest Port 1 View  Result Date: 09/28/2023 CLINICAL DATA:  Shortness of breath with history of lymphoma. Receiving XRT for lymphoma. EXAM: PORTABLE CHEST 1 VIEW COMPARISON:  Chest CT with contrast 05/29/2023 FINDINGS: Right IJ port catheter again terminates at about the superior cavoatrial junction. A skin fold overlies the outer right upper lung field simulating a pneumothorax. No pneumothorax is seen. The lungs are clear. The sulci are sharp. There is mild cardiomegaly. No vascular  congestion is seen. Stable mediastinum with aortic tortuosity and atherosclerosis. DJD both shoulders with no new osseous findings. IMPRESSION: 1. No evidence of acute chest disease. 2. Mild cardiomegaly. 3. Aortic atherosclerosis. 4. Right IJ port catheter. Electronically Signed   By: Almira Bar M.D.   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 Status post double lines of treatment with R-CHOP, Bendamustine Rituxan, ibrutinib, pirtobrutinib. He was referred for CAR-T cell therapy to Dr.Vaidya but initially declined this when he was in good shape to pursue this. He has now changed his mind and after detailed goals of care discussion wants to consider additional treatment options. He is clearly progressing on Pirtobrutinib at this time.  2. Extensive retroperitoneal and pelvic lymphadenopathy. Lymphedema/LN swelling -likely due to significant involvement of abdominal and pelvic lymph nodes by his blastoid variant mantle cell lymphoma.  3. 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  4.  Bilateral hydronephrosis due to ureteral obstruction from mantle cell lymphoma.  PLAN: -Discussed lab results from today, 10/08/2023, in detail with the patient. CMP shows elevated BUN of 97 and elevated creatinine of 4.12. -Patient was scheduled for out-patent treatment today, but patient was not discharged from the hospital since he developed PE in his lungs.. -Patient is currently on IV Heparin.  -Discussed the option of hospice with the patient or the option to go with treatment after his health improves.  -Reasonable to continue prednisone 40 mg p.o. daily till he is able to start his outpatient treatment. -off Pirtobrutinib  The total time spent in the appointment was *** minutes* .  All of the patient's questions were  answered with apparent satisfaction. The patient knows to call the clinic with any problems, questions or concerns.   Wyvonnia Lora MD MS AAHIVMS [facility] Hematology/Oncology Physician [facility]  .*Total Encounter Time as defined by the Centers for Medicare and Medicaid Services includes, in addition to the face-to-face time of a patient visit (documented in the note above) non-face-to-face time: obtaining and reviewing outside history, ordering and reviewing medications, tests or procedures, care coordination (communications with other health care professionals or caregivers) and documentation in the medical record.   I,Param Shah,acting as a Neurosurgeon for Wyvonnia Lora, MD.

## 2023-10-08 NOTE — Progress Notes (Addendum)
HOSPITALIST ROUNDING NOTE Jerry Tran QMV:[id number]  DOB: [birth year]  DOA: 09/28/2023  PCP: [facility]  10/08/2023,10:34 AM   LOS: 10 days      Code Status: full  From:  home    Current Dispo: unclear     75 bm known PTSD Prostate ca + XRT--Mantel cell lymphoma diagnosed 11/2021 admission during hopsitalization--- follows currently with Dr. Kale/Dr. Basilio Cairo and has had XRT Underlying HFpEF  60-65% echo 10/25/22 DD on echo   Previous H. pylori infection 10/2021 Paroxysmal A-fib HTN HLD He has some subacute retention and has refused in the past indwelling Foley  He developed relapsed mantle cell lymphoma was seen in the ED 09/20/2023 for increasing lymphedema in the setting of increased fluctuance he of lymph nodes in the groin etc.--- he is Protunitib with no toxicity--- second opinion from oncologist Dr. Adin Hector underway for car T-cell therapies?  Bite trial  Initial presentation 11/29 with progressing shortness of breath leg groin swelling in the setting of severe AKI with BUN/creatinine >120 and 3 CT scan progressive adenopathy new bilateral hydroureteronephrosis secondary to compressive effect increased bladder thickening as well as anasarca Patient was admitted oncology urology consulted  12/6 Ureteric stent placed for extrinsic compression 12/7 early a.m. more tachypneic needing oxygen increased respiratory rate- RENAL US mild hydronephrosis bilaterally Foley catheter in place-- VQ scan turned out positive for 3 small PEs, lower extremity duplex positive for bilateral DVTs Nephrology consulted feels like he has acute kidney injury on CKD 3 AA combination obstructive/ischemic ATN from hypotension 12/8 CBI started secondary to thick hematuria  Plan  Hypoxic resp failure 12/6 ?  Aspiration pneumonia--Stop Augmentin today-do not suspect this is aspiration Pulmonary emboli-3 seen on VQ scan Overall oxygenation is improved-he was placed back on it overnight Lifelong  anticoagulation given underlying cancer history-continue heparin GTT and can switch over to p.o. anticoagulation once hematuria has resolved  Worsening ATN 2/2 ischemia/hypotension less likely extrinsic compression-peak 93/5.1 Creatinine no worse downtrending slowly--given Lasix 12/8 Nephrology input appreciated-keep volumes even today-- -10 L since arrival and expect edema will continue to go down Continues on CBI as per urology  Acute urinary infection Growing 80,000 K Pseudomonas--would treat as infection because of the hematuria and recent instrumentation Start Cipro 500 every 24 X 3 days and then stop   hypovolemic hyponatremia, metabolic acidosis with elevated anion gap  Lasix held because of natriuresis,  received one-time dose 12/8  would not limit fluids,  if continued hyponatremia likely fluid restriction  start bicarb 650 twice daily  Paroxysmal A-fib CHADVASC >4 no anticoagulation No rate control at this time, monitor--- anticoagulation as above with heparin switch to DOAC at discharge  Mantle cell relapsed refractory blastoid Pancytopenia Continues on prednisone 40 daily may need to de-escalate or cut back dose as per oncology Will probably need to resume suppressive acyclovir in the next 1 to 2 days Unclear intent of therapy at this time looks like was getting a second opinion at [facility] under care of Dr Alphonsa Gin Await further input from Dr. Candise Che Patient has not met transfusion threshold does have hematuria we will transfuse if hemoglobin drops below 7  Anasarca HFpEF last echo EF 65% grade 1 diastolic dysfunction 09/2022 Anasarca is not secondary to heart failure it is secondary to extrinsic compression and adenopathy Fluid status as above  He is a full code palliative care has seen him last seen on 12/5 we are hopeful for recovery I discussed the case with Dr. Ronie Spies over  secure chat patient will be seen today to talk about bigger picture long-term goals and  treatment I spoke briefly with his wife at the bedside   DVT prophylaxis: Heparin GTT  Status is: Inpatient Remains inpatient appropriate because:   Not stable for discharge  Subjective:  Awake coherent no distress looks fair  On oxygen again today No shortness of breath Eating drinking Seems less swollen Spirits are up  Objective + exam Vitals:   10/07/23 0917 10/07/23 1330 10/07/23 1806 10/07/23 2152  BP: 97/64 96/70 98/72  91/63  Pulse: 98 69 71 90  Resp: 16 14 20 17   Temp: 97.9 F (36.6 C) 98.2 F (36.8 C) 97.7 F (36.5 C) 97.6 F (36.4 C)  TempSrc: Oral Oral Oral Oral  SpO2: 99% 97% 98% 100%  Weight:      Height:       Filed Weights   09/28/23 2012 09/29/23 1312 10/05/23 1340  Weight: 104.3 kg 104.7 kg 104.7 kg    Examination:  EOMI NCAT no icterus no pallor No wheeze looks well Chest is clear  Abdomen distended Anasarca is decreased Has CBI going with pink-tinged urine not thick Power 5/5    Data Reviewed: reviewed   CBC    Component Value Date/Time   WBC 8.4 10/07/2023 0113   RBC 2.71 (L) 10/07/2023 0113   HGB 7.7 (L) 10/07/2023 1557   HGB 9.5 (L) 09/26/2023 1529   HCT 23.2 (L) 10/07/2023 1557   PLT 58 (L) 10/07/2023 0113   PLT 114 (L) 09/26/2023 1529   MCV 85.6 10/07/2023 0113   MCH 28.8 10/07/2023 0113   MCHC 33.6 10/07/2023 0113   RDW 17.4 (H) 10/07/2023 0113   LYMPHSABS 0.5 (L) 09/28/2023 2038   MONOABS 0.4 09/28/2023 2038   EOSABS 0.0 09/28/2023 2038   BASOSABS 0.0 09/28/2023 2038      Latest Ref Rng & Units 10/08/2023    5:55 AM 10/07/2023    1:13 AM 10/06/2023    3:38 AM  CMP  Glucose 70 - 99 mg/dL 295  621    308  657   BUN 8 - 23 mg/dL 97  97    846  93   Creatinine 0.61 - 1.24 mg/dL 9.62  9.52    8.41  3.24   Sodium 135 - 145 mmol/L 128  133    134  131   Potassium 3.5 - 5.1 mmol/L 3.7  4.1    4.1  4.3   Chloride 98 - 111 mmol/L 98  102    103  104   CO2 22 - 32 mmol/L 14  19    19  15    Calcium 8.9 - 10.3  mg/dL 7.9  8.1    8.1  8.7      Scheduled Meds:  amoxicillin-clavulanate  1 tablet Oral Q12H   atorvastatin  10 mg Oral QHS   Chlorhexidine Gluconate Cloth  6 each Topical Daily   docusate sodium  100 mg Oral BID   multivitamin with minerals  1 tablet Oral Daily   oxybutynin  10 mg Oral QHS   pantoprazole  40 mg Oral Daily   predniSONE  40 mg Oral Q breakfast   senna  1 tablet Oral BID   sodium bicarbonate  650 mg Oral BID   sodium chloride flush  3 mL Intravenous Q12H   Continuous Infusions:  heparin 1,400 Units/hr (10/07/23 2052)   sodium chloride irrigation      Time  55  Jai-Gurmukh  Mahala Menghini, MD  [facility]

## 2023-10-08 NOTE — Anesthesia Postprocedure Evaluation (Signed)
Anesthesia Post Note  Patient: Jerry Tran  Procedure(s) Performed: CYSTOSCOPY WITH BILATERAL RETROGRADE PYELOGRAM and BILATERAL STENT PLACEMENT (Bilateral: Ureter)     Patient location during evaluation: PACU Anesthesia Type: General Level of consciousness: awake and alert Pain management: pain level controlled Vital Signs Assessment: post-procedure vital signs reviewed and stable Respiratory status: spontaneous breathing, nonlabored ventilation, respiratory function stable and patient connected to nasal cannula oxygen Cardiovascular status: blood pressure returned to baseline and stable Postop Assessment: no apparent nausea or vomiting Anesthetic complications: no   No notable events documented.  Last Vitals:  Vitals:   10/07/23 1806 10/07/23 2152  BP: 98/72 91/63  Pulse: 71 90  Resp: 20 17  Temp: 36.5 C 36.4 C  SpO2: 98% 100%    Last Pain:  Vitals:   10/07/23 2152  TempSrc: Oral  PainSc: 0-No pain   Pain Goal:                   Kennieth Rad

## 2023-10-08 NOTE — Progress Notes (Signed)
Nutrition Follow-up  DOCUMENTATION CODES:   Non-severe (moderate) malnutrition in context of chronic illness  INTERVENTION:  - Regular diet, double portions.  - Ensure Plus High Protein po BID, each supplement provides 350 kcal and 20 grams of protein. - Encourage intake at all meals and of supplements.  - Multivitamin with minerals daily - Monitor weight trends.   NUTRITION DIAGNOSIS:   Moderate Malnutrition related to chronic illness (mantle cell lymphoma) as evidenced by mild fat depletion, mild muscle depletion. *new  GOAL:   Patient will meet greater than or equal to 90% of their needs *progressing  MONITOR:   PO intake, Supplement acceptance, Weight trends  REASON FOR ASSESSMENT:   Consult Assessment of nutrition requirement/status  ASSESSMENT:   Pt with history significant of mantle cell lymphoma, CKD, diastolic CHF, A-fib, HLD presented with edema, leg swelling and groin swelling for the past 2 weeks PTA and worsening shortness of breath  Patient reports his appetite has been good but he hasn't been eating well due to dislike of the available hospital food. He was documented to be consuming 75-100% of meals but nothing has been recorded since 12/5 to assess recent intake.  He has been able to find certain things on the menu he likes. Discussed adding double portions and patient agreeable. Also reviewed ordering salt/pepper/herb seasoning on trays to add more flavor. Informed patient he can also have food brought in if desired. Reminded him to inform nurse if doing so.  He is also agreeable to receive Ensure as he reports enjoying it.     Medications reviewed and include: Colace, Senokot, MVI  Labs reviewed:  Na 128 Creatinine 4.12 Phosphorus 6.5   NUTRITION - FOCUSED PHYSICAL EXAM:  Flowsheet Row Most Recent Value  Orbital Region No depletion  Upper Arm Region Moderate depletion  Thoracic and Lumbar Region Mild depletion  Buccal Region Mild depletion   Temple Region Mild depletion  Clavicle Bone Region Severe depletion  Clavicle and Acromion Bone Region Moderate depletion  Scapular Bone Region Unable to assess  Dorsal Hand No depletion   Patellar Region No depletion  Anterior Thigh Region No depletion  Posterior Calf Region No depletion  Edema (RD Assessment) Mild  Hair Reviewed  Eyes Reviewed  Mouth Reviewed  Skin Reviewed  Nails Reviewed       Diet Order:   Diet Order             Diet regular Room service appropriate? Yes; Fluid consistency: Thin  Diet effective now                   EDUCATION NEEDS:  Education needs have been addressed  Skin:  Skin Assessment: Reviewed RN Assessment  Last BM:  12/9  Height:  Ht Readings from Last 1 Encounters:  10/05/23 6\' 4"  (1.93 m)   Weight:  Wt Readings from Last 1 Encounters:  10/05/23 104.7 kg   Ideal Body Weight:  91.8 kg  BMI:  Body mass index is 28.1 kg/m.  Estimated Nutritional Needs:  Kcal:  2300-2500 Protein:  115-130 grams Fluid:  > 2 L    Shelle Iron RD, LDN Contact via Secure Chat.

## 2023-10-08 NOTE — Progress Notes (Signed)
[facility] Progress Note   Subjective: Pt seen in room. 9.1 L of UOP yesterday. Legs still swollen but "not as tight". No new c/o's.   Vitals:   10/07/23 1330 10/07/23 1806 10/07/23 2152 10/08/23 1316  BP: 96/70 98/72 91/63  92/79  Pulse: 69 71 90 93  Resp: 14 20 17    Temp: 98.2 F (36.8 C) 97.7 F (36.5 C) 97.6 F (36.4 C) 97.9 F (36.6 C)  TempSrc: Oral Oral Oral Oral  SpO2: 97% 98% 100% 98%  Weight:      Height:        Exam: General:NAD, comfortable Heart:RRR, s1s2 nl Lungs:clear b/l, no crackle Abdomen:soft, mild distention and abdomen wall has edema. Extremities: 3+ bilat LE pitting edema up to thigh and lower abdominal wall. Neurology: Alert, awake and following commands.   Assessment/ Plan:  # Acute kidney injury on CKD stage IIIa likely due to combination of obstructive uropathy and ischemic ATN from relative hypotension.  He is status post a bilateral tubal J ureteral stent placement by urology on 12/06 with increased urine output and improving renal labs.  The physical exam consistent with anasarca/fluid overload slowly improving. Will repeat IV lasix x 1 today. Continue with strict ins and out and daily lab.  No indication for dialysis. Creat peaked at 5.1, was down to 4.8 yest and 4.1 this am. Cont to follow.   # Bilateral hydronephrosis: due to extrinsic lymphatic compression status post bilateral ureteral stent by urologist.  Currently he has hematuria in the setting of heparin drip, urology is following with no plan for nephrostomy tube at this time.  # Anion gap metabolic acidosis: Treated with sodium bicarbonate, follow labs.  Diuretics will help with volume contraction.  # Hypoxia/PE.  Currently on heparin drip per primary team.  # Metastatic mantle cell lymphoma: Currently on prednisone and oncology is following.  # Hyponatremia, hypervolemic: Try diuretics again today.  Follow labs.   Vinson Moselle MD  [facility] 10/08/2023, 3:43 PM  Recent  Labs  Lab 10/07/23 0113 10/07/23 1557 10/08/23 0555  HGB 7.8* 7.7*  --   ALBUMIN 2.8*  --  2.7*  CALCIUM 8.1*  8.1*  --  7.9*  PHOS 5.7*  --  6.5*  CREATININE 4.75*  4.85*  --  4.12*  K 4.1  4.1  --  3.7   No results for input(s): "IRON", "TIBC", "FERRITIN" in the last 168 hours. Inpatient medications:  atorvastatin  10 mg Oral QHS   Chlorhexidine Gluconate Cloth  6 each Topical Daily   ciprofloxacin  500 mg Oral Q breakfast   docusate sodium  100 mg Oral BID   feeding supplement  237 mL Oral BID BM   multivitamin with minerals  1 tablet Oral Daily   oxybutynin  10 mg Oral QHS   pantoprazole  40 mg Oral Daily   predniSONE  40 mg Oral Q breakfast   senna  1 tablet Oral BID   sodium bicarbonate  650 mg Oral BID   sodium chloride flush  3 mL Intravenous Q12H    heparin 1,400 Units/hr (10/07/23 2052)   sodium chloride irrigation     acetaminophen **OR** acetaminophen, bisacodyl, HYDROcodone-acetaminophen, ondansetron **OR** ondansetron (ZOFRAN) IV, polyethylene glycol, sodium chloride flush, sodium chloride flush

## 2023-10-08 NOTE — IPAL (Addendum)
Called back to patient room as apparently patient was having difficulty moving the right leg On exam he is able to flex knee-to-chest stable to flex at the hip--it was more spasm apparently and is gone Power is 5/5 reflexes are intact he still remains swollen I met Dr. Candise Che outside the room we had a good conversation After my exam I do not think that there is any cauda equina or any other issue going on-I did detailed however my discussion with Dr. Candise Che to the patient and his wife-I have explained to them that he has sustained 4 lines of chemo and despite this his cancer is progressing I have suggested they seriously consider hospice--Jerry Tran has strong faith and wants to believe that God will cure him.  I agreed with his hopes, all the while pointing out that his body is failing him, and that he is being called to a different existence outside of our current plane. I have reached out to palliative care to coordinate the same and have a further discussion about what goals of care would look like and disposition planning in case they are interested in this Patient remains full code I have asked him to think carefully about this given uncurable nature of his cancer and poor recovery if were to be aggressively resuscitated   > 30 minutes advance care planning   Pleas Koch, MD [facility] 2:01 PM

## 2023-10-09 ENCOUNTER — Inpatient Hospital Stay (HOSPITAL_COMMUNITY): Payer: No Typology Code available for payment source

## 2023-10-09 DIAGNOSIS — N179 Acute kidney failure, unspecified: Secondary | ICD-10-CM | POA: Diagnosis not present

## 2023-10-09 LAB — CBC
HCT: 12.9 % — ABNORMAL LOW (ref 39.0–52.0)
Hemoglobin: 4.1 g/dL — CL (ref 13.0–17.0)
MCH: 28.1 pg (ref 26.0–34.0)
MCHC: 31.8 g/dL (ref 30.0–36.0)
MCV: 88.4 fL (ref 80.0–100.0)
Platelets: 69 10*3/uL — ABNORMAL LOW (ref 150–400)
RBC: 1.46 MIL/uL — ABNORMAL LOW (ref 4.22–5.81)
RDW: 16.8 % — ABNORMAL HIGH (ref 11.5–15.5)
WBC: 9.7 10*3/uL (ref 4.0–10.5)
nRBC: 0.2 % (ref 0.0–0.2)

## 2023-10-09 LAB — RENAL FUNCTION PANEL
Albumin: 2.5 g/dL — ABNORMAL LOW (ref 3.5–5.0)
Anion gap: 14 (ref 5–15)
BUN: 111 mg/dL — ABNORMAL HIGH (ref 8–23)
CO2: 17 mmol/L — ABNORMAL LOW (ref 22–32)
Calcium: 7.9 mg/dL — ABNORMAL LOW (ref 8.9–10.3)
Chloride: 99 mmol/L (ref 98–111)
Creatinine, Ser: 4.83 mg/dL — ABNORMAL HIGH (ref 0.61–1.24)
GFR, Estimated: 12 mL/min — ABNORMAL LOW (ref >60)
Glucose, Bld: 150 mg/dL — ABNORMAL HIGH (ref 70–99)
Phosphorus: 7 mg/dL — ABNORMAL HIGH (ref 2.5–4.6)
Potassium: 4.8 mmol/L (ref 3.5–5.1)
Sodium: 130 mmol/L — ABNORMAL LOW (ref 135–145)

## 2023-10-09 LAB — HEMOGLOBIN AND HEMATOCRIT, BLOOD
HCT: 16.4 % — ABNORMAL LOW (ref 39.0–52.0)
Hemoglobin: 5.6 g/dL — CL (ref 13.0–17.0)

## 2023-10-09 LAB — HEPARIN LEVEL (UNFRACTIONATED)
Heparin Unfractionated: 0.48 [IU]/mL (ref 0.30–0.70)
Heparin Unfractionated: 0.33 [IU]/mL (ref 0.30–0.70)

## 2023-10-09 LAB — PREPARE RBC (CROSSMATCH)

## 2023-10-09 MED ORDER — SODIUM CHLORIDE 0.9% IV SOLUTION: Freq: Once | INTRAVENOUS | Status: AC

## 2023-10-09 MED ORDER — HEPARIN (PORCINE) 25000 UT/250ML-% IV SOLN
1400.0000 [IU]/h | INTRAVENOUS | Status: DC
Start: 2023-10-09 — End: 2023-10-10
  Administered 2023-10-09: 1400 [IU]/h via INTRAVENOUS
  Filled 2023-10-09: qty 250

## 2023-10-09 NOTE — Progress Notes (Signed)
PHARMACY - ANTICOAGULATION CONSULT NOTE  Pharmacy Consult for Heparin Indication: pulmonary embolus  Allergies  Allergen Reactions   Sildenafil Other (See Comments)    Unknown reaction - reported by [facility]    Patient Measurements: Height: 6\' 4"  (193 cm) Weight: 104.7 kg (230 lb 13.2 oz) IBW/kg (Calculated) : 86.8 Heparin Dosing Weight:  104.7  Vital Signs: Temp: 98.2 F (36.8 C) (12/10 2156) Temp Source: Oral (12/10 2156) BP: 104/69 (12/10 2156) Pulse Rate: 73 (12/10 2121)  Labs: Recent Labs    10/07/23 0113 10/07/23 1551 10/07/23 1557 10/07/23 2250 10/08/23 0555 10/08/23 0635 10/09/23 0130 10/09/23 1910  HGB 7.8*  --  7.7*  --   --   --  4.1* 5.6*  HCT 23.2*  --  23.2*  --   --   --  12.9* 16.4*  PLT 58*  --   --   --   --   --  69*  --   HEPARINUNFRC 0.93*   < >  --    < > <0.10* 0.58 0.48 0.33  CREATININE 4.75*  4.85*  --   --   --  4.12*  --  4.83*  --    < > = values in this interval not displayed.    Estimated Creatinine Clearance: 17 mL/min (A) (by C-G formula based on SCr of 4.83 mg/dL (H)).  Assessment: 77 yo M with +PE per VQ scan &  B DVT on heparin per pharmacy.   10/09/2023: AM Heparin level 0.48- remains therapeutic on IV heparin 1400 units/hr.  Hg down to 4.1, PLT 68.   Scr 4.83 Heparin drip stopped by RN @ 0505 am, then resumed per MD at 0730 RN reported CBI with hematuria all night 2 units of PRBCs ordered by [facility] night coverage  1910 HL 0.33 therapeutic on 1400 units/hr Hgb 5.6  Goal of Therapy:  Heparin level 0.3-0.7 units/ml Monitor platelets by anticoagulation protocol: Yes   Plan:  D/w overnight coverage, 2 units of PRBCs ordered F/u post Hgb Continue heparin drip at 1400 units/hr Daily CBC and heparin level  Arley Phenix RPh 10/09/2023, 10:42 PM

## 2023-10-09 NOTE — Progress Notes (Signed)
[facility] Progress Note   Subjective: Pt seen in room. CBI is complicating UOP amounts. Can't really tell how much UOP there is.   Vitals:   10/09/23 1106 10/09/23 1247 10/09/23 1305 10/09/23 1519  BP: 100/68 97/66 100/63 109/66  Pulse: 80 68 74   Resp:  20 16 20   Temp: 97.7 F (36.5 C) 97.8 F (36.6 C) 97.6 F (36.4 C) 97.9 F (36.6 C)  TempSrc: Axillary Axillary Axillary Axillary  SpO2: 98% 98% 100% 100%  Weight:      Height:        Exam: General:NAD, comfortable Heart:RRR, s1s2 nl Lungs:clear b/l, no crackle Abdomen:soft, mild distention and abdomen wall has edema. Extremities: 3+ bilat LE pitting edema up to thigh and lower abdominal wall. Neurology: Alert, awake and following commands.   Assessment/ Plan:  # Acute kidney injury on CKD stage IIIa likely due to combination of obstructive uropathy and ischemic ATN from relative hypotension.  He is status post a bilateral tubal J ureteral stent placement by urology on 12/06 with increased urine output and improving renal labs.  The physical exam consistent with anasarca/fluid overload, not much better today. Creat bumped up to 4.8 today. Will avoid anymore IV lasix. F/u labs in am. Hopefully creatinine will cont to decline.   # Bilateral hydronephrosis: due to extrinsic lymphatic compression status post bilateral ureteral stent by urologist. Hematuria appears to be improving, getting CBI per urology.    # Anion gap metabolic acidosis: Treated with sodium bicarbonate, follow labs.    # Metastatic mantle cell lymphoma: Currently on prednisone and oncology is following.  # Hyponatremia, hypervolemic: will hold IV lasix for now.    Vinson Moselle MD  [facility] 10/09/2023, 3:48 PM  Recent Labs  Lab 10/07/23 1557 10/08/23 0555 10/09/23 0130  HGB 7.7*  --  4.1*  ALBUMIN  --  2.7* 2.5*  CALCIUM  --  7.9* 7.9*  PHOS  --  6.5* 7.0*  CREATININE  --  4.12* 4.83*  K  --  3.7 4.8   No results for input(s):  "IRON", "TIBC", "FERRITIN" in the last 168 hours. Inpatient medications:  atorvastatin  10 mg Oral QHS   Chlorhexidine Gluconate Cloth  6 each Topical Daily   ciprofloxacin  500 mg Oral Q breakfast   docusate sodium  100 mg Oral BID   feeding supplement  237 mL Oral BID BM   multivitamin with minerals  1 tablet Oral Daily   oxybutynin  10 mg Oral QHS   pantoprazole  40 mg Oral Daily   predniSONE  40 mg Oral Q breakfast   senna  1 tablet Oral BID   sodium bicarbonate  650 mg Oral BID   sodium chloride flush  3 mL Intravenous Q12H    heparin 1,400 Units/hr (10/09/23 1322)   sodium chloride irrigation     acetaminophen **OR** acetaminophen, bisacodyl, HYDROcodone-acetaminophen, ondansetron **OR** ondansetron (ZOFRAN) IV, polyethylene glycol, sodium chloride flush, sodium chloride flush

## 2023-10-09 NOTE — Plan of Care (Signed)

## 2023-10-09 NOTE — Progress Notes (Addendum)
PHARMACY - ANTICOAGULATION CONSULT NOTE  Pharmacy Consult for Heparin Indication: pulmonary embolus  Allergies  Allergen Reactions   Sildenafil Other (See Comments)    Unknown reaction - reported by [facility]    Patient Measurements: Height: 6\' 4"  (193 cm) Weight: 104.7 kg (230 lb 13.2 oz) IBW/kg (Calculated) : 86.8 Heparin Dosing Weight:  104.7  Vital Signs: Temp: 97.7 F (36.5 C) (12/10 0447) Temp Source: Oral (12/10 0447) BP: 96/63 (12/10 0500) Pulse Rate: 59 (12/10 0500)  Labs: Recent Labs    10/07/23 0113 10/07/23 1551 10/07/23 1557 10/07/23 2250 10/08/23 0555 10/08/23 0635 10/09/23 0130  HGB 7.8*  --  7.7*  --   --   --  4.1*  HCT 23.2*  --  23.2*  --   --   --  12.9*  PLT 58*  --   --   --   --   --  69*  HEPARINUNFRC 0.93*   < >  --    < > <0.10* 0.58 0.48  CREATININE 4.75*  4.85*  --   --   --  4.12*  --  4.83*   < > = values in this interval not displayed.    Estimated Creatinine Clearance: 17 mL/min (A) (by C-G formula based on SCr of 4.83 mg/dL (H)).  Assessment: 77 yo M with +PE per VQ scan &  B DVT on heparin per pharmacy.   10/09/2023: AM Heparin level 0.48- remains therapeutic on IV heparin 1400 units/hr.  Hg down to 4.1, PLT 68.   Scr 4.83 Heparin drip stopped by RN @ 0505 am RN reported CBI with hematuria all night 2 units of PRBCs ordered by [facility] night coverage  Goal of Therapy:  Heparin level 0.3-0.7 units/ml Monitor platelets by anticoagulation protocol: Yes   Plan:  Heparin stopped @ 0505 am with Hg of 4.1 2 Units PRBC ordered Will f/u for guidance from MD for when heparin will be resumed  Herby Abraham, Pharm.D Use secure chat for questions 10/09/2023 8:12 AM  Addendum: D/w Dr Mahala Menghini resume heparin drip now  Plan: resume heparin at 1400 units/hr with no bolus Check heparin level 8 hrs after resumed Daily CBC & heparin level  Herby Abraham, Pharm.D Use secure chat for questions 10/09/2023 10:15 AM

## 2023-10-09 NOTE — Progress Notes (Signed)
   4 Days Post-Op Subjective: Pt resting comfortably on rounds. 7  Objective: Vital signs in last 24 hours: Temp:  [97.5 F (36.4 C)-98.1 F (36.7 C)] 97.6 F (36.4 C) (12/10 1305) Pulse Rate:  [59-92] 74 (12/10 1305) Resp:  [15-20] 16 (12/10 1305) BP: (92-110)/(61-72) 100/63 (12/10 1305) SpO2:  [86 %-100 %] 100 % (12/10 1305)  Assessment/Plan: # Bilateral hydronephrosis  # Penoscrotal edema  2/2 extrinsic lymphatic compression  S/p bilateral ureteral stent placement with Dr. Alvester Morin on 12/6 Diffuse anasarca.  Recommend keeping scrotum/penis elevated.     #Hematuria #ABLA #Pulmonary embolism  3 filling defects present on VQ scan, consistent with PE. As these appear to be subsegmental, Primary team recommends lifelong anticoagulation Hematuria 2/2 heparin infusion in the context of indwelling ureteral stents. Bleeding stopped today while heparin was paused  Trend labs. Hgb 7.7-->4.1 today. 2u PRBC ordered CBI off at this time.               29f hematuria catheter placed 12/8.  PRN hand irrigation NPO midnight  Intake/Output from previous day: 12/09 0701 - 12/10 0700 In: 15085.7 [P.O.:1780; I.V.:305.7] Out: 8900 [Urine:8900]  Intake/Output this shift: Total I/O In: 1030.4 [P.O.:720; Blood:310.4] Out: 2100 [Urine:2100]  Physical Exam:  General: Sleeping CV: No cyanosis Lungs: equal chest rise Gu: 12f three-way hematuria catheter in place.  CBI off. Clear yellow urine  Lab Results: Recent Labs    10/07/23 0113 10/07/23 1557 10/09/23 0130  HGB 7.8* 7.7* 4.1*  HCT 23.2* 23.2* 12.9*   BMET Recent Labs    10/08/23 0555 10/09/23 0130  NA 128* 130*  K 3.7 4.8  CL 98 99  CO2 14* 17*  GLUCOSE 174* 150*  BUN 97* 111*  CREATININE 4.12* 4.83*  CALCIUM 7.9* 7.9*     Studies/Results: No results found.    LOS: 11 days   Elmon Kirschner, NP [facility] Pager: [phone]  10/09/2023, 1:56 PM

## 2023-10-09 NOTE — Progress Notes (Signed)
HOSPITALIST ROUNDING NOTE Jerry Tran XLK:[id number]  DOB: 08-05-1946  DOA: 09/28/2023  PCP: [facility]  10/09/2023,1:46 PM   LOS: 11 days      Code Status: full  From:  home    Current Dispo: unclear     75 bm known PTSD Prostate ca + XRT--Mantel cell lymphoma diagnosed 11/2021 admission during hopsitalization--- follows currently with Dr. Kale/Dr. Basilio Cairo and has had XRT Underlying HFpEF  60-65% echo 10/25/22 DD on echo   Previous H. pylori infection 10/2021 Paroxysmal A-fib HTN HLD He has some subacute retention and has refused in the past indwelling Foley  He developed relapsed mantle cell lymphoma was seen in the ED 09/20/2023 for increasing lymphedema in the setting of increased fluctuance he of lymph nodes in the groin etc.--- he is Protunitib with no toxicity--- second opinion from oncologist Dr. Adin Hector underway for car T-cell therapies?  Bite trial  Initial presentation 11/29 with progressing shortness of breath leg groin swelling in the setting of severe AKI with BUN/creatinine >120 and 3 CT scan progressive adenopathy new bilateral hydroureteronephrosis secondary to compressive effect increased bladder thickening as well as anasarca Patient was admitted oncology urology consulted  12/6 Ureteric stent placed for extrinsic compression 12/7 early a.m. more tachypneic needing oxygen increased respiratory rate- RENAL US mild hydronephrosis bilaterally Foley catheter in place-- VQ scan turned out positive for 3 small PEs, lower extremity duplex positive for bilateral DVTs Nephrology consulted feels like he has acute kidney injury on CKD 3 AA combination obstructive/ischemic ATN from hypotension 12/8 CBI started secondary to thick hematuria 12/10 hemoglobin 4.1-no source of bleed-transfused 2 units recheck  Plan  Hypoxic respiratory failure secondary to 3 pulmonary emboli-VQ scan shows this she also has bilateral lower extremity DVTs Continue heparin GTT for now  unclear why hemoglobin dropped De-escalate oxygen as able with desat screen Treated for possible aspiration and stopped Rx 12/8   acute anemia Etiology unclear?  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 and pressure on kidneys CBI still in process under care of urologist Growing 80,000 Pseudomonas treat with Cipro every 24 X 3 days then discontinue Rx  Hypovolemic hyponatremia metabolic acidosis elevated anion gap Bicarb was given earlier in hospital stay Defer to nephrology   multifactorial anasarca with pancytopenia relapsed refractory mantle cell blastoid subtype Daily prednisone 40 hold chemotherapy Detailed discussion as below with family  albumin is low in the 2 range which may be adding to his anasarca  Long discussion with family on 12/9-patient and family wish full code-palliative care to revisit Prognosis is quite guarded despite multiple conversations by myself and Dr. Tresa Endo as per my prior note   DVT prophylaxis: Heparin GTT  Status is: Inpatient Remains inpatient appropriate because:   Not stable for discharge  Subjective:  Coherent awake reasonable spirits No chest pain fever No hematuria CBI seems to be clearing up no dark tarry stool No vomiting blood No back pain  Objective + exam Vitals:   10/09/23 0916 10/09/23 1106 10/09/23 1247 10/09/23 1305  BP: 110/69 100/68 97/66 100/63  Pulse:  80 68 74  Resp: 20  20 16   Temp: 97.9 F (36.6 C) 97.7 F (36.5 C) 97.8 F (36.6 C) 97.6 F (36.4 C)  TempSrc: Axillary Axillary Axillary  Axillary  SpO2: 96% 98% 98% 100%  Weight:      Height:       Filed Weights   09/28/23 2012 09/29/23 1312 10/05/23 1340  Weight: 104.3  kg 104.7 kg 104.7 kg    Examination:  EOMI [place] AT looks fair, on oxygen No wheeze rales rhonchi posteriorly Diffuse anasarca all the way up to buttocks and abdomen S1-S2 no murmur ROM intact Power 5/5 but limited by swelling and some discomfort    Data Reviewed: reviewed   CBC    Component Value Date/Time   WBC 9.7 10/09/2023 0130   RBC 1.46 (L) 10/09/2023 0130   HGB 4.1 (LL) 10/09/2023 0130   HGB 9.5 (L) 09/26/2023 1529   HCT 12.9 (L) 10/09/2023 0130   PLT 69 (L) 10/09/2023 0130   PLT 114 (L) 09/26/2023 1529   MCV 88.4 10/09/2023 0130   MCH 28.1 10/09/2023 0130   MCHC 31.8 10/09/2023 0130   RDW 16.8 (H) 10/09/2023 0130   LYMPHSABS 0.5 (L) 09/28/2023 2038   MONOABS 0.4 09/28/2023 2038   EOSABS 0.0 09/28/2023 2038   BASOSABS 0.0 09/28/2023 2038      Latest Ref Rng & Units 10/09/2023    1:30 AM 10/08/2023    5:55 AM 10/07/2023    1:13 AM  CMP  Glucose 70 - 99 mg/dL 960  454  098    119   BUN 8 - 23 mg/dL 147  97  97    829   Creatinine 0.61 - 1.24 mg/dL 5.62  1.30  8.65    7.84   Sodium 135 - 145 mmol/L 130  128  133    134   Potassium 3.5 - 5.1 mmol/L 4.8  3.7  4.1    4.1   Chloride 98 - 111 mmol/L 99  98  102    103   CO2 22 - 32 mmol/L 17  14  19    19    Calcium 8.9 - 10.3 mg/dL 7.9  7.9  8.1    8.1      Scheduled Meds:  atorvastatin  10 mg Oral QHS   Chlorhexidine Gluconate Cloth  6 each Topical Daily   ciprofloxacin  500 mg Oral Q breakfast   docusate sodium  100 mg Oral BID   feeding supplement  237 mL Oral BID BM   multivitamin with minerals  1 tablet Oral Daily   oxybutynin  10 mg Oral QHS   pantoprazole  40 mg Oral Daily   predniSONE  40 mg Oral Q breakfast   senna  1 tablet Oral BID   sodium bicarbonate  650 mg Oral BID   sodium chloride flush  3 mL Intravenous Q12H   Continuous Infusions:  heparin 1,400 Units/hr (10/09/23 1322)   sodium chloride irrigation      Time  25  Rhetta Mura, MD  [facility]

## 2023-10-10 DIAGNOSIS — N179 Acute kidney failure, unspecified: Secondary | ICD-10-CM | POA: Diagnosis not present

## 2023-10-10 LAB — RENAL FUNCTION PANEL
Albumin: 2.7 g/dL — ABNORMAL LOW (ref 3.5–5.0)
Anion gap: 11 (ref 5–15)
BUN: 106 mg/dL — ABNORMAL HIGH (ref 8–23)
CO2: 21 mmol/L — ABNORMAL LOW (ref 22–32)
Calcium: 8.2 mg/dL — ABNORMAL LOW (ref 8.9–10.3)
Chloride: 97 mmol/L — ABNORMAL LOW (ref 98–111)
Creatinine, Ser: 3.91 mg/dL — ABNORMAL HIGH (ref 0.61–1.24)
GFR, Estimated: 15 mL/min — ABNORMAL LOW (ref >60)
Glucose, Bld: 102 mg/dL — ABNORMAL HIGH (ref 70–99)
Phosphorus: 5.3 mg/dL — ABNORMAL HIGH (ref 2.5–4.6)
Potassium: 4 mmol/L (ref 3.5–5.1)
Sodium: 129 mmol/L — ABNORMAL LOW (ref 135–145)

## 2023-10-10 LAB — CBC
HCT: 19.5 % — ABNORMAL LOW (ref 39.0–52.0)
Hemoglobin: 6.6 g/dL — CL (ref 13.0–17.0)
MCH: 28.6 pg (ref 26.0–34.0)
MCHC: 33.8 g/dL (ref 30.0–36.0)
MCV: 84.4 fL (ref 80.0–100.0)
Platelets: 59 10*3/uL — ABNORMAL LOW (ref 150–400)
RBC: 2.31 MIL/uL — ABNORMAL LOW (ref 4.22–5.81)
RDW: 16.2 % — ABNORMAL HIGH (ref 11.5–15.5)
WBC: 6.3 10*3/uL (ref 4.0–10.5)
nRBC: 0.3 % — ABNORMAL HIGH (ref 0.0–0.2)

## 2023-10-10 LAB — PREPARE RBC (CROSSMATCH)

## 2023-10-10 LAB — HEPARIN LEVEL (UNFRACTIONATED): Heparin Unfractionated: 0.45 [IU]/mL (ref 0.30–0.70)

## 2023-10-10 MED ORDER — CIPROFLOXACIN HCL 500 MG PO TABS
500.0000 mg | ORAL_TABLET | Freq: Every day | ORAL | Status: DC
  Administered 2023-10-10 – 2023-10-12 (×3): 500 mg via ORAL
  Filled 2023-10-10 (×3): qty 1

## 2023-10-10 MED ORDER — SODIUM CHLORIDE 0.9% IV SOLUTION: Freq: Once | INTRAVENOUS | Status: AC

## 2023-10-10 NOTE — Evaluation (Signed)
Physical Therapy Evaluation Patient Details Name: Jerry Tran MRN: [id number] DOB: 16-Feb-1946 Today's Date: 10/10/2023  History of Present Illness  77 yo male admitted to hospital 09/28/23 with dyspnea, LE + groin swelling. Found to have AKI, lymphadenopathy in setting of lymphoma. Hx mantle cell lymphoma, CKD, CHF, Afib, PTSD, prostate Ca s/p radiation, arthritis. Eval and d/c on 09/30/23. S/P bil ureteral stent placement 12/6. Acute PE and bil LE DVTs 10/06/23. Anemia, hematuria witih imaging on 12/10 showing possible intramuscular hemorrhage psoas muscles. Reorder PT eval 10/10/23 2* medical and functional decline. Palliative care now involved.  Clinical Impression  On re-eval, pt required Mod A for bed mobility and Min A to stand and ambulate ~12 feet x 2 with a RW. Pt presents with general weakness, decreased activity tolerance, and impaired gait and balance. Max A for toileting hygiene in bathroom. HR up to 124 bpm, O2 >90% on RA. Mobility is also quite difficult due to significant edema in scrotum/groin/bil LEs. Functional mobility decline compared to eval on 12/1. Will continue to follow and progress activity as tolerated. PT recommendation updated on today. Patient will benefit from continued inpatient follow up therapy, <3 hours/day.         If plan is discharge home, recommend the following: A lot of help with walking and/or transfers;A lot of help with bathing/dressing/bathroom;Assistance with cooking/housework;Assist for transportation;Help with stairs or ramp for entrance   Can travel by private vehicle   No    Equipment Recommendations None recommended by PT  Recommendations for Other Services       Functional Status Assessment Patient has had a recent decline in their functional status and demonstrates the ability to make significant improvements in function in a reasonable and predictable amount of time.     Precautions / Restrictions Precautions Precautions:  Fall Precaution Comments: monitor O2, HR; significant scrotal and LE edema Restrictions Weight Bearing Restrictions: No      Mobility  Bed Mobility Overal bed mobility: Needs Assistance Bed Mobility: Supine to Sit, Sit to Supine     Supine to sit: Mod assist, HOB elevated, Used rails Sit to supine: Mod assist, HOB elevated, Used rails   General bed mobility comments: Increased time. Heavy reliance on elevated HOB and bil railings. Utilized bedpad to assist with getting pt to EOB. Rest break taken/needed once EOB.    Transfers Overall transfer level: Needs assistance Equipment used: Rolling walker (2 wheels) Transfers: Sit to/from Stand Sit to Stand: From elevated surface Min Assist           General transfer comment: Assist to rise, steady, control descent. Increased time. Cues for safety, technique, hand placement. Sitting uncomfortable.    Ambulation/Gait Ambulation/Gait assistance: Min assist Gait Distance (Feet): 12 Feet (x2) Assistive device: Rolling walker (2 wheels) Gait Pattern/deviations: Step-through pattern, Wide base of support, Trunk flexed, Decreased stride length       General Gait Details: Assist to steady pt and manage RW intermittently. HR up to 124 bpm, O2 >90%, dyspnea 2/4. Fatigues easily, quickly. Pt able to and from bathroom with RW on today. Fall risk 2* weakness, fatigue.  Stairs            Wheelchair Mobility     Tilt Bed    Modified Rankin (Stroke Patients Only)       Balance Overall balance assessment: Needs assistance         Standing balance support: Bilateral upper extremity supported, Reliant on assistive device for balance Standing balance-Leahy Scale: Poor  Pertinent Vitals/Pain Pain Assessment Pain Assessment: Faces Faces Pain Scale: Hurts little more Pain Location: groin/scrotal area, LEs, back Pain Descriptors / Indicators: Grimacing, Guarding Pain Intervention(s):  Limited activity within patient's tolerance, Monitored during session, Repositioned    Home Living Family/patient expects to be discharged to:: Unsure Living Arrangements: Spouse/significant other Available Help at Discharge: Family Type of Home: Apartment Home Access: Level entry       Home Layout: One level Home Equipment: Cane - single point      Prior Function Prior Level of Function : Independent/Modified Independent             Mobility Comments: uses cane PRN ADLs Comments: He was independent with ADLs, light cleaning, and occasional driving. His spouse performed the cooking. He is a Optician, dispensing.     Extremity/Trunk Assessment   Upper Extremity Assessment Upper Extremity Assessment: Defer to OT evaluation    Lower Extremity Assessment Lower Extremity Assessment: Generalized weakness RLE Deficits / Details: edema noted-signitificant (worse than on initial eval 12/1) LLE Deficits / Details: edema noted-significant (worse than on initial eval 12/1)       Communication   Communication Communication: No apparent difficulties  Cognition Arousal: Alert Behavior During Therapy: WFL for tasks assessed/performed, Anxious Overall Cognitive Status: Within Functional Limits for tasks assessed                                          General Comments      Exercises General Exercises - Upper Extremity Shoulder Flexion: AROM, Both, 5 reps, Standing General Exercises - Lower Extremity Hip Flexion/Marching: AROM, Both, 20 reps, Standing   Assessment/Plan    PT Assessment Patient needs continued PT services  PT Problem List Decreased strength;Decreased range of motion;Decreased activity tolerance;Decreased balance;Decreased mobility;Decreased knowledge of use of DME;Obesity;Pain       PT Treatment Interventions DME instruction;Gait training;Functional mobility training;Therapeutic activities;Therapeutic exercise;Patient/family education;Balance  training    PT Goals (Current goals can be found in the Care Plan section)  Acute Rehab PT Goals Patient Stated Goal: less edema; get strength back PT Goal Formulation: With patient/family Time For Goal Achievement: 10/24/23 Potential to Achieve Goals: Good    Frequency Min 1X/week     Co-evaluation               AM-PAC PT "6 Clicks" Mobility  Outcome Measure Help needed turning from your back to your side while in a flat bed without using bedrails?: A Lot Help needed moving from lying on your back to sitting on the side of a flat bed without using bedrails?: A Lot Help needed moving to and from a bed to a chair (including a wheelchair)?: A Lot Help needed standing up from a chair using your arms (e.g., wheelchair or bedside chair)?: A Lot Help needed to walk in hospital room?: A Lot Help needed climbing 3-5 steps with a railing? : Total 6 Click Score: 11    End of Session Equipment Utilized During Treatment: Oxygen Activity Tolerance: Patient limited by fatigue;Patient limited by pain Patient left: in bed;with call bell/phone within reach;with bed alarm set;with family/visitor present   PT Visit Diagnosis: Muscle weakness (generalized) (M62.81);Difficulty in walking, not elsewhere classified (R26.2)    Time: 0454-0981 PT Time Calculation (min) (ACUTE ONLY): 32 min   Charges:   PT Evaluation $PT Re-evaluation: 1 Re-eval PT Treatments $Gait Training: 8-22 mins PT General Charges $$  ACUTE PT VISIT: 1 Visit           Faye Ramsay, PT Acute Rehabilitation  Office: [phone]

## 2023-10-10 NOTE — Progress Notes (Addendum)
PROGRESS NOTE    Jerry Tran  ZOX:[id number] DOB: Mar 28, 1946 DOA: 09/28/2023 PCP: [facility]   Brief Narrative:  77 y.o. male with medical history significant of mantle cell lymphoma, CKD, diastolic CHF, A-fib, HLD presented with edema, leg swelling and groin swelling for the past 2 weeks and worsening shortness of breath.  On presentation, BUN/creatinine were more than 120 and 3.  CT of abdomen showed progressive adenopathy along with new bilateral hydroureteronephrosis secondary to compressive effect lower bladder thickening and anasarca.  Oncology and urology were consulted.  He underwent ureteric stent placement for extrinsic compression on 10/05/2023.  On 10/06/2023, he was found to be more tachypneic: VQ scan was suggestive of 3 moderate-sized subsegmental perfusion defects compatible with PE.  He was started on heparin drip.  He subsequently had hematuria requiring CBI.  On 10/09/2023, hemoglobin was found to be 4.1: He was transfused 3 units packed red cells.  Assessment & Plan:   Acute blood loss anemia on top of anemia of chronic disease -Possibly from hematuria and retroperitoneal bleeding.  Hemoglobin was found to be 4.1 on 10/19/2023: Status post 3 units packed red cells transfusion.  Hemoglobin still 6.6 this morning.  Will transfuse 1 unit unit of packed red cells.  Monitor H&H. -Having intermittent hematuria. -CT of abdomen and pelvis without contrast on 10/09/2023 suggested possible intramuscular hemorrhage in the psoas muscles with bilateral ureteral stents with mild bilateral hydronephrosis -Hold heparin.  Acute hypoxic respiratory failure Possible PE with bilateral acute lower extremity DVT -VQ scan as above.  Heparin plan as above. -Currently on 2 L oxygen via nasal cannula.  AKI on CKD stage IIIa Acute metabolic acidosis -Due to combination of obstructive uropathy and ischemic ATN from relative hypotension -Creatinine improving to 3.91 today.  Nephrology  following.  Follow recommendations of Lasix  Bilateral hydroureteronephrosis Possibly UTI Hematuria -Due to compression from mantle cell metastasis -Status post ureteric stent placement on 10/05/2023.  Urology following. -Still having hematuria.  Treated with -Urine culture grew 80,000 colonies per mL of Pseudomonas aeruginosa from 10/06/2023.  Currently on oral Cipro  Hyponatremia -Sodium 129 today.  Follow nephrology recommendations  Thrombocytopenia -Due to mantle cell lymphoma.  Platelets worsening.  Monitor  Extensive retroperitoneal and pelvic lymphadenopathy Lymphedema Mantle cell lymphoma Goals of care -Likely due to significant involvement of abdominal and pelvic lymph nodes by his mantle cell lymphoma -Oncology following.  Currently on prednisone.  Oncology recommended hospice.  Will follow-up palliative care recommendations    DVT prophylaxis: Off heparin drip Code Status: Full Family Communication: Brother on phone Disposition Plan: Status is: Inpatient Remains inpatient appropriate because: Of severity of illness  Consultants: Oncology/urology/nephrology/palliative care  Procedures: None  Antimicrobials:  Anti-infectives (From admission, onward)    Start     Dose/Rate Route Frequency Ordered Stop   10/09/23 0800  ciprofloxacin (CIPRO) tablet 500 mg  Status:  Discontinued        500 mg Oral Daily with breakfast 10/08/23 1035 10/08/23 1049   10/08/23 1145  ciprofloxacin (CIPRO) tablet 500 mg        500 mg Oral Daily with breakfast 10/08/23 1049 10/11/23 0759   10/07/23 1800  amoxicillin-clavulanate (AUGMENTIN) 500-125 MG per tablet 1 tablet  Status:  Discontinued        1 tablet Oral Every 12 hours 10/07/23 1333 10/08/23 1035   10/06/23 1100  Ampicillin-Sulbactam (UNASYN) 3 g in sodium chloride 0.9 % 100 mL IVPB  Status:  Discontinued  3 g 200 mL/hr over 30 Minutes Intravenous Every 12 hours 10/06/23 0948 10/07/23 1333   10/05/23 1038  ceFAZolin (ANCEF)  IVPB 2g/100 mL premix        2 g 200 mL/hr over 30 Minutes Intravenous 30 min pre-op 10/05/23 1038 10/05/23 1549        Subjective: Patient seen and examined at bedside.  Awake, slow to respond.  Denies worsening abdominal pain, fever or vomiting.  No seizures or agitation reported.  Objective: Vitals:   10/09/23 2156 10/10/23 0108 10/10/23 0449 10/10/23 0455  BP: 104/69 109/72 130/86   Pulse:  80 83   Resp: 18 20 20    Temp: 98.2 F (36.8 C) 97.9 F (36.6 C) 98.4 F (36.9 C)   TempSrc: Oral Oral Oral   SpO2: 100% 100% 99%   Weight:    115.3 kg  Height:        Intake/Output Summary (Last 24 hours) at 10/10/2023 1311 Last data filed at 10/10/2023 1045 Gross per 24 hour  Intake 4322.45 ml  Output 4750 ml  Net -427.55 ml   Filed Weights   09/29/23 1312 10/05/23 1340 10/10/23 0455  Weight: 104.7 kg 104.7 kg 115.3 kg    Examination:  General exam: Appears calm and comfortable.  Looks chronically ill and deconditioned.  On 2 L oxygen by nasal cannula. Respiratory system: Bilateral decreased breath sounds at bases with scattered crackles Cardiovascular system: S1 & S2 heard, Rate controlled Gastrointestinal system: Abdomen is obese, nondistended, soft and nontender. Normal bowel sounds heard. Extremities: No cyanosis, clubbing; trace lower extremity edema present  Central nervous system: Awake, slow to respond, poor historian. No focal neurological deficits. Moving extremities Skin: No rashes, lesions or ulcers Psychiatry: Flat affect.  Not agitated.   Data Reviewed: I have personally reviewed following labs and imaging studies  CBC: Recent Labs  Lab 10/05/23 0304 10/06/23 0338 10/07/23 0113 10/07/23 1557 10/09/23 0130 10/09/23 1910 10/10/23 0322  WBC 3.8* 6.8 8.4  --  9.7  --  6.3  HGB 8.4* 8.1* 7.8* 7.7* 4.1* 5.6* 6.6*  HCT 26.9* 25.3* 23.2* 23.2* 12.9* 16.4* 19.5*  MCV 90.3 88.2 85.6  --  88.4  --  84.4  PLT 132* 85* 58*  --  69*  --  59*   Basic  Metabolic Panel: Recent Labs  Lab 10/06/23 0338 10/07/23 0113 10/08/23 0555 10/09/23 0130 10/10/23 0322  NA 131* 134*  133* 128* 130* 129*  K 4.3 4.1  4.1 3.7 4.8 4.0  CL 104 103  102 98 99 97*  CO2 15* 19*  19* 14* 17* 21*  GLUCOSE 109* 104*  106* 174* 150* 102*  BUN 93* 104*  97* 97* 111* 106*  CREATININE 5.12* 4.75*  4.85* 4.12* 4.83* 3.91*  CALCIUM 8.7* 8.1*  8.1* 7.9* 7.9* 8.2*  PHOS  --  5.7* 6.5* 7.0* 5.3*   GFR: Estimated Creatinine Clearance: 22 mL/min (A) (by C-G formula based on SCr of 3.91 mg/dL (H)). Liver Function Tests: Recent Labs  Lab 10/07/23 0113 10/08/23 0555 10/09/23 0130 10/10/23 0322  ALBUMIN 2.8* 2.7* 2.5* 2.7*   No results for input(s): "LIPASE", "AMYLASE" in the last 168 hours. No results for input(s): "AMMONIA" in the last 168 hours. Coagulation Profile: No results for input(s): "INR", "PROTIME" in the last 168 hours. Cardiac Enzymes: No results for input(s): "CKTOTAL", "CKMB", "CKMBINDEX", "TROPONINI" in the last 168 hours. BNP (last 3 results) No results for input(s): "PROBNP" in the last 8760 hours. HbA1C: No results for  input(s): "HGBA1C" in the last 72 hours. CBG: No results for input(s): "GLUCAP" in the last 168 hours. Lipid Profile: No results for input(s): "CHOL", "HDL", "LDLCALC", "TRIG", "CHOLHDL", "LDLDIRECT" in the last 72 hours. Thyroid Function Tests: No results for input(s): "TSH", "T4TOTAL", "FREET4", "T3FREE", "THYROIDAB" in the last 72 hours. Anemia Panel: No results for input(s): "VITAMINB12", "FOLATE", "FERRITIN", "TIBC", "IRON", "RETICCTPCT" in the last 72 hours. Sepsis Labs: No results for input(s): "PROCALCITON", "LATICACIDVEN" in the last 168 hours.  Recent Results (from the past 240 hour(s))  Urine Culture (for pregnant, neutropenic or urologic patients or patients with an indwelling urinary catheter)     Status: Abnormal   Collection Time: 10/06/23  8:43 AM   Specimen: Urine, Clean Catch  Result Value  Ref Range Status   Specimen Description   Final    URINE, CLEAN CATCH Performed at [facility], [address], [place], [place] [address]    Special Requests   Final    NONE Performed at [facility], [address], [place], [place] [address]    Culture 80,000 COLONIES/mL PSEUDOMONAS AERUGINOSA (A)  Final   Report Status 10/08/2023 FINAL  Final   Organism ID, Bacteria PSEUDOMONAS AERUGINOSA (A)  Final      Susceptibility   Pseudomonas aeruginosa - MIC*    CEFTAZIDIME 4 SENSITIVE Sensitive     CIPROFLOXACIN <=0.25 SENSITIVE Sensitive     GENTAMICIN <=1 SENSITIVE Sensitive     IMIPENEM 1 SENSITIVE Sensitive     PIP/TAZO <=4 SENSITIVE Sensitive ug/mL    CEFEPIME 2 SENSITIVE Sensitive     * 80,000 COLONIES/mL PSEUDOMONAS AERUGINOSA         Radiology Studies: CT ABDOMEN PELVIS WO CONTRAST  Result Date: 10/09/2023 CLINICAL DATA:  Retroperitoneal bleed suspected.  Lymphoma. EXAM: CT ABDOMEN AND PELVIS WITHOUT CONTRAST TECHNIQUE: Multidetector CT imaging of the abdomen and pelvis was performed following the standard protocol without IV contrast. RADIATION DOSE REDUCTION: This exam was performed according to the departmental dose-optimization program which includes automated exposure control, adjustment of the mA and/or kV according to patient size and/or use of iterative reconstruction technique. COMPARISON:  CT abdomen pelvis dated 09/28/2023. FINDINGS: Evaluation of this exam is limited in the absence of intravenous contrast. Lower chest: Partially visualized small bilateral pleural effusions with partial compressive atelectasis of the lower lobes. Pneumonia is not excluded. The tip of a central venous line noted at the cavoatrial junction. There is hypoattenuation of the cardiac blood pool suggestive of anemia. Clinical correlation is recommended. No intra-abdominal free air.  Small ascites. Hepatobiliary: Several small liver cysts. No biliary  dilatation. No calcified gallstone. Pancreas: Unremarkable. No pancreatic ductal dilatation or surrounding inflammatory changes. Spleen: Normal in size without focal abnormality. Adrenals/Urinary Tract: Bilateral pigtail ureteral stents noted with proximal tips in the renal pelvis and distal end in the urinary bladder. There is mild bilateral hydronephrosis similar to prior CT. No stone noted. The urinary bladder is decompressed around a Foley catheter. Stomach/Bowel: Small hiatal hernia. Mildly thickened small bowel loops, likely related to ascites and anasarca. Enteritis is less likely. There is no bowel obstruction. The appendix is normal. Vascular/Lymphatic: Mild aortoiliac atherosclerotic disease. The IVC is unremarkable. No portal venous gas. Bulky retroperitoneal and bilateral pelvic sidewall adenopathy. Right inguinal adenopathy as seen previously. Reproductive: Several biopsy clips in the prostate gland. Other: There is diffuse subcutaneous edema and anasarca, significantly progressed since the prior CT. Enlargement of the psoas muscles with ill-defined higher attenuation suspicious for intramuscular  hemorrhage. Musculoskeletal: Osteopenia with degenerative changes of the spine. No acute osseous pathology. IMPRESSION: 1. Enlargement of the psoas muscles suspicious for intramuscular hemorrhage. 2. Bulky retroperitoneal and bilateral pelvic sidewall adenopathy. 3. Bilateral pigtail ureteral stents with mild bilateral hydronephrosis similar to prior CT. 4. Small ascites. Diffuse subcutaneous edema and anasarca, significantly progressed since the prior CT. 5. Partially visualized small bilateral pleural effusions with partial compressive atelectasis of the lower lobes. Pneumonia is not excluded. 6.  Aortic Atherosclerosis (ICD10-I70.0). Electronically Signed   By: Elgie Collard M.D.   On: 10/09/2023 20:26        Scheduled Meds:  sodium chloride   Intravenous Once   atorvastatin  10 mg Oral QHS    Chlorhexidine Gluconate Cloth  6 each Topical Daily   ciprofloxacin  500 mg Oral Q breakfast   docusate sodium  100 mg Oral BID   feeding supplement  237 mL Oral BID BM   multivitamin with minerals  1 tablet Oral Daily   oxybutynin  10 mg Oral QHS   pantoprazole  40 mg Oral Daily   predniSONE  40 mg Oral Q breakfast   senna  1 tablet Oral BID   sodium bicarbonate  650 mg Oral BID   sodium chloride flush  3 mL Intravenous Q12H   Continuous Infusions:        Glade Lloyd, MD [facility] 10/10/2023, 1:11 PM

## 2023-10-10 NOTE — TOC Initial Note (Addendum)
Transition of Care [facility]) - Initial/Assessment Note    Patient Details  Name: Jerry Tran MRN: [id number] Date of Birth: 13-Jun-1946  Transition of Care [facility]) CM/SW Contact:    Larrie Kass, LCSW Phone Number: 10/10/2023, 10:15 AM  Clinical Narrative:                 CSW received a call from [facility] inquiring about recommendations for the patient. CSW spoke with Leanna Sato, RN [phone]), who reports receiving a call from pt's wife requesting assistance getting skilled nursing placement with hospice services. Ms lau reports that pt's wife expressed her concerns about being unable to care for the pt at home. Per pt's wife after the last test results, she was told there is not much that can be done for him and wants placement with hospice. Ms Sol Blazing reports pt's wife stated pt is having a difficult time with this and wants his children involved. CSW explained no recommendations have been made at this time for hospice services.  Ms Sol Blazing reports the [facility] would have to review the [facility] recommendations and pt's clinicals to determine if pt qualifies for placement. CSW explained once recommendations are made this writer will contact their office with an update. TOC to follow.    ADDEN 2:20pm  CSW received a message to speak with pt's wife about insurance benefits. CSW spoke with pt and pt's wife Jerry Tran at bedside. CSW informed both they will need to contact pt's [facility] SW to get more information on what pt's benefits would look like. CSW provided pt with VA SW contact information. CSW activity listened as pt's wife expressed her concerns with pt's situation. Pt's wife inquired about home care if pt is to return home. CSW explained pt will have to discuss what the [facility] can offer with VA SW and if not anything they can privately pay for personal care services.    CSW spoke with Ms Sol Blazing with the [facility] to go over pt's clinical information. After reviewing pt's Chart, he ambulated 200 plus feet with a  Mobility Specialist on 12/7.  Ms Sol Blazing stated she was under the impression the pt was bedbound and is requesting PT eval to determine the level of care. MD was made aware. TOC to follow for recommendations.      Expected Discharge Plan:  (TBD) Barriers to Discharge: Continued Medical Work up   Patient Goals and CMS Choice            Expected Discharge Plan and Services                                              Prior Living Arrangements/Services                       Activities of Daily Living   ADL Screening (condition at time of admission) Independently performs ADLs?: No Does the patient have a NEW difficulty with bathing/dressing/toileting/self-feeding that is expected to last >3 days?: No Does the patient have a NEW difficulty with getting in/out of bed, walking, or climbing stairs that is expected to last >3 days?: No Does the patient have a NEW difficulty with communication that is expected to last >3 days?: No Is the patient deaf or have difficulty hearing?: No Does the patient have difficulty seeing, even when wearing glasses/contacts?: No Does the patient have difficulty  concentrating, remembering, or making decisions?: No  Permission Sought/Granted                  Emotional Assessment              Admission diagnosis:  Anasarca [R60.1] AKI (acute kidney injury) (HCC) [N17.9] Bilateral lower extremity edema [R60.0] Patient Active Problem List   Diagnosis Date Noted   Malnutrition of moderate degree 10/08/2023   Other pancytopenia (HCC) 10/03/2023   Anemia associated with chemotherapy 09/29/2023   Bilateral lower extremity edema 09/29/2023   Anasarca 09/29/2023   AKI (acute kidney injury) (HCC) 09/28/2023   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 pylori infection 12/09/2021   Prostate cancer (HCC) 03/2021   PCP:  [facility]:   [facility] [place], [place] - [address] [place] [address] Phone: [phone] Fax: [phone]  [facility] [address], [place] - [address] [place] [address] Phone: [phone] Fax: [phone]  [place] [facility] [address] [place] [address] Phone: [phone] Fax: [phone]     Social Determinants of Health (SDOH) Social History: SDOH Screenings   Food Insecurity: No Food Insecurity (09/29/2023)  Housing: Low Risk  (09/29/2023)  Transportation Needs: No Transportation Needs (09/29/2023)  Utilities: Not At Risk (09/29/2023)  Alcohol Screen: Low Risk  (08/08/2023)  Depression (PHQ2-9): Low Risk  (04/03/2023)  Tobacco Use: Medium Risk (10/05/2023)  Health Literacy: Adequate Health Literacy (08/08/2023)   SDOH Interventions:     Readmission Risk Interventions     No data to display

## 2023-10-10 NOTE — Progress Notes (Signed)
5 Days Post-Op Subjective: Pt resting comfortably on rounds. Hematuria has returned mildly. CBI off  Objective: Vital signs in last 24 hours: Temp:  [97.6 F (36.4 C)-98.4 F (36.9 C)] 98.4 F (36.9 C) (12/11 0449) Pulse Rate:  [68-86] 83 (12/11 0449) Resp:  [16-21] 20 (12/11 0449) BP: (97-130)/(63-86) 130/86 (12/11 0449) SpO2:  [96 %-100 %] 99 % (12/11 0449) Weight:  [115.3 kg] 115.3 kg (12/11 0455)  Assessment/Plan: # Bilateral hydronephrosis  # Penoscrotal edema  2/2 extrinsic lymphatic compression  S/p bilateral ureteral stent placement with Dr. Alvester Morin on 12/6 Diffuse anasarca.  Recommend keeping scrotum/penis elevated.     #Hematuria #ABLA #Pulmonary embolism  3 filling defects present on VQ scan, consistent with PE. As these appear to be subsegmental, Primary team recommends lifelong anticoagulation Hematuria 2/2 heparin infusion in the context of indwelling ureteral stents.  Trend labs. Hgb 7.7-->4.1-->6.6. 2u PRBC ordered. Still anemic CBI off at this time.          23f hematuria catheter placed 12/8.  PRN hand irrigation CT A/P shows psoas enlargement concerning for intramuscular hemorrhage  Reviewed case and plan with the patient and his brother Marcy Salvo by phone.  Unfortunately Mr. Gensemer is being medically painted into a corner.  We reviewed that with his possible intramuscular bleeding and return of hematuria, he is unlikely to be able to continue systemic anticoagulation.  Should he continue to do that, he is not in a position to where he would be able to be transfused every few days.  We discussed the hypercoagulability that comes with cancer and the fact that a larger pulmonary embolism could prove fatal, as could the dangerous levels of acute blood loss anemia he experienced yesterday.  I have reached out to his primary team and we discussed all meeting together with palliative if possible to review goals of care.  Intake/Output from previous day: 12/10 0701  - 12/11 0700 In: 5112.9 [P.O.:1520; I.V.:576.8; Blood:1016.1] Out: 5850 [Urine:5850]  Intake/Output this shift: Total I/O In: -  Out: 550 [Urine:550]  Physical Exam:  General: Sleeping CV: No cyanosis Lungs: equal chest rise Gu: 73f three-way hematuria catheter in place.  CBI off. Light pink urine  Lab Results: Recent Labs    10/09/23 0130 10/09/23 1910 10/10/23 0322  HGB 4.1* 5.6* 6.6*  HCT 12.9* 16.4* 19.5*   BMET Recent Labs    10/09/23 0130 10/10/23 0322  NA 130* 129*  K 4.8 4.0  CL 99 97*  CO2 17* 21*  GLUCOSE 150* 102*  BUN 111* 106*  CREATININE 4.83* 3.91*  CALCIUM 7.9* 8.2*     Studies/Results: CT ABDOMEN PELVIS WO CONTRAST  Result Date: 10/09/2023 CLINICAL DATA:  Retroperitoneal bleed suspected.  Lymphoma. EXAM: CT ABDOMEN AND PELVIS WITHOUT CONTRAST TECHNIQUE: Multidetector CT imaging of the abdomen and pelvis was performed following the standard protocol without IV contrast. RADIATION DOSE REDUCTION: This exam was performed according to the departmental dose-optimization program which includes automated exposure control, adjustment of the mA and/or kV according to patient size and/or use of iterative reconstruction technique. COMPARISON:  CT abdomen pelvis dated 09/28/2023. FINDINGS: Evaluation of this exam is limited in the absence of intravenous contrast. Lower chest: Partially visualized small bilateral pleural effusions with partial compressive atelectasis of the lower lobes. Pneumonia is not excluded. The tip of a central venous line noted at the cavoatrial junction. There is hypoattenuation of the cardiac blood pool suggestive of anemia. Clinical correlation is recommended. No intra-abdominal free air.  Small ascites. Hepatobiliary:  Several small liver cysts. No biliary dilatation. No calcified gallstone. Pancreas: Unremarkable. No pancreatic ductal dilatation or surrounding inflammatory changes. Spleen: Normal in size without focal abnormality.  Adrenals/Urinary Tract: Bilateral pigtail ureteral stents noted with proximal tips in the renal pelvis and distal end in the urinary bladder. There is mild bilateral hydronephrosis similar to prior CT. No stone noted. The urinary bladder is decompressed around a Foley catheter. Stomach/Bowel: Small hiatal hernia. Mildly thickened small bowel loops, likely related to ascites and anasarca. Enteritis is less likely. There is no bowel obstruction. The appendix is normal. Vascular/Lymphatic: Mild aortoiliac atherosclerotic disease. The IVC is unremarkable. No portal venous gas. Bulky retroperitoneal and bilateral pelvic sidewall adenopathy. Right inguinal adenopathy as seen previously. Reproductive: Several biopsy clips in the prostate gland. Other: There is diffuse subcutaneous edema and anasarca, significantly progressed since the prior CT. Enlargement of the psoas muscles with ill-defined higher attenuation suspicious for intramuscular hemorrhage. Musculoskeletal: Osteopenia with degenerative changes of the spine. No acute osseous pathology. IMPRESSION: 1. Enlargement of the psoas muscles suspicious for intramuscular hemorrhage. 2. Bulky retroperitoneal and bilateral pelvic sidewall adenopathy. 3. Bilateral pigtail ureteral stents with mild bilateral hydronephrosis similar to prior CT. 4. Small ascites. Diffuse subcutaneous edema and anasarca, significantly progressed since the prior CT. 5. Partially visualized small bilateral pleural effusions with partial compressive atelectasis of the lower lobes. Pneumonia is not excluded. 6.  Aortic Atherosclerosis (ICD10-I70.0). Electronically Signed   By: Elgie Collard M.D.   On: 10/09/2023 20:26      LOS: 12 days   Elmon Kirschner, NP [facility] Pager: [phone]  10/10/2023, 9:12 AM

## 2023-10-10 NOTE — Progress Notes (Signed)
Daily Progress Note   Patient Name: Jerry Tran       Date: 10/10/2023 DOB: 1946/08/14  Age: 77 y.o. MRN#: [id number] Attending Physician: Glade Lloyd, MD Primary Care Physician: [facility] Admit Date: 09/28/2023  Reason for Consultation/Follow-up: Establishing goals of care  Subjective: Awake alert, resting in bed   Length of Stay: 12  Current Medications: Scheduled Meds:   sodium chloride   Intravenous Once   atorvastatin  10 mg Oral QHS   Chlorhexidine Gluconate Cloth  6 each Topical Daily   ciprofloxacin  500 mg Oral Q breakfast   ciprofloxacin  500 mg Oral Daily   docusate sodium  100 mg Oral BID   feeding supplement  237 mL Oral BID BM   multivitamin with minerals  1 tablet Oral Daily   oxybutynin  10 mg Oral QHS   pantoprazole  40 mg Oral Daily   predniSONE  40 mg Oral Q breakfast   senna  1 tablet Oral BID   sodium bicarbonate  650 mg Oral BID   sodium chloride flush  3 mL Intravenous Q12H    Continuous Infusions:   PRN Meds: acetaminophen **OR** acetaminophen, bisacodyl, HYDROcodone-acetaminophen, ondansetron **OR** ondansetron (ZOFRAN) IV, polyethylene glycol, sodium chloride flush, sodium chloride flush  Physical Exam         Generalized weakness Diffuse anasarca Regular work of breathing Has some LE edema Awake alert Mood and affect with mild anxiety evident.   Vital Signs: BP 130/86 (BP Location: Left Arm)   Pulse 83   Temp 98.4 F (36.9 C) (Oral)   Resp 20   Ht 6\' 4"  (1.93 m)   Wt 115.3 kg   SpO2 99%   BMI 30.94 kg/m  SpO2: SpO2: 99 % O2 Device: O2 Device: Nasal Cannula O2 Flow Rate: O2 Flow Rate (L/min): 2 L/min  Intake/output summary:  Intake/Output Summary (Last 24 hours) at 10/10/2023 1418 Last data filed at  10/10/2023 1045 Gross per 24 hour  Intake 2082.45 ml  Output 4750 ml  Net -2667.55 ml   LBM: Last BM Date : 10/09/23 Baseline Weight: Weight: 104.3 kg Most recent weight: Weight: 115.3 kg       Palliative Assessment/Data:      Patient Active Problem List   Diagnosis Date Noted   Malnutrition of moderate degree 10/08/2023  Other pancytopenia (HCC) 10/03/2023   Anemia associated with chemotherapy 09/29/2023   Bilateral lower extremity edema 09/29/2023   Anasarca 09/29/2023   AKI (acute kidney injury) (HCC) 09/28/2023   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 pylori infection 12/09/2021   Prostate cancer (HCC) 03/2021    Palliative Care Assessment & Plan   Patient Profile: 5 bm known PTSD Prostate ca + XRT--Mantel cell lymphoma diagnosed 11/2021 admission during hopsitalization--- follows currently with Dr. Kale/Dr. Basilio Cairo and has had XRT Underlying HFpEF  60-65% echo 10/25/22 DD on echo   Previous H. pylori infection 10/2021 Paroxysmal A-fib HTN HLD Initial presentation 11/29 with progressing shortness of breath leg groin swelling in the setting of severe AKI with BUN/creatinine >120 and 3 CT scan progressive adenopathy new bilateral hydroureteronephrosis secondary to compressive effect increased bladder thickening as well as anasarca Patient was admitted oncology urology consulted   12/6 Ureteric stent placed for extrinsic  compression 12/7 early a.m. more tachypneic needing oxygen increased respiratory rate- RENAL US mild hydronephrosis bilaterally Foley catheter in place-- VQ scan turned out positive for 3 small PEs, lower extremity duplex positive for bilateral DVTs Nephrology consulted feels like he has acute kidney injury on CKD 3 AA combination obstructive/ischemic ATN from hypotension 12/8 CBI started secondary to thick hematuria 12/10 hemoglobin 4.1-no source of bleed-transfused 2 units recheck   Assessment: Functional decline Generalized weakness Hypoxic respiratory failure secondary to 3 pulmonary emboli-VQ scan shows this and he also has bilateral lower extremity DVTs  AKI secondary to ischemia hypotension with extrinsic component-peak 93/5.1 BUN/creatinine  multifactorial anasarca with pancytopenia relapsed refractory mantle cell blastoid subtype  Recommendations/Plan: Patient is being followed in a multidisciplinary manner by nephrology, medical oncology, urology.  He remains admitted to hospital medicine service.  Overall consensus is for more of a hospice focused approach.  As such, repeat goals of care discussion was requested from palliative services.  Chart reviewed, patient seen and examined, met with patient and wife was also present at bedside. Palliative medicine is specialized medical care for people living with serious illness. It focuses on providing relief from the symptoms and stress of a serious illness. The goal is to improve quality of life for both the patient and the family.  CODE STATUS and goals of care discussions undertaken.  All of the patient's and wife's questions answered to the best of my ability.  Explained to him about multiple serious conditions in the face of underlying malignancy.  Offered time and space for reflection.  Patient states that he needs some time to think through and to discuss with additional family members.  PMT to follow-up on 10-11-2023.  Goals of Care  and Additional Recommendations: Limitations on Scope of Treatment: Full Scope Treatment  Code Status:    Code Status Orders  (From admission, onward)           Start     Ordered   09/28/23 2347  Full code  Continuous       Question:  By:  Answer:  Consent: discussion documented in EHR   09/28/23 2348           Code Status History     Date Active Date Inactive Code Status Order ID Comments User Context   11/06/2022 1402 11/07/2022 0517 Full Code [id number]  Sterling Big, MD HOV   10/16/2022 1356 10/17/2022 0511 Full Code [id number]  Gilmer Mor, DO HOV   12/09/2021 0841 12/11/2021 2203 Full Code [id number]  Jonah Blue, MD ED       Prognosis:  < 6 months  Discharge Planning: To Be Determined  Care plan was discussed with patient and wife.   Thank you for allowing the Palliative Medicine Team to assist in the care of this patient.  High MDM.      Greater than 50%  of this time was spent counseling and coordinating care related to the above assessment and plan.  Rosalin Hawking, MD  Please contact Palliative Medicine Team phone at [phone] for questions and concerns.

## 2023-10-10 NOTE — Progress Notes (Signed)
[facility] Progress Note   Subjective: Pt seen in room. CBI finished, turned off. Creat down today to 3.91. UOP good sp CBI.  Pt feels better than on admission, legs are not as tight.   Vitals:   10/09/23 2156 10/10/23 0108 10/10/23 0449 10/10/23 0455  BP: 104/69 109/72 130/86   Pulse:  80 83   Resp: 18 20 20    Temp: 98.2 F (36.8 C) 97.9 F (36.6 C) 98.4 F (36.9 C)   TempSrc: Oral Oral Oral   SpO2: 100% 100% 99%   Weight:    115.3 kg  Height:        Exam: General:NAD, comfortable Heart:RRR, s1s2 nl Lungs:clear b/l, no crackle Abdomen:soft, mild distention and abdomen wall has edema. Extremities: 3+ bilat LE pitting edema up to thigh and lower abdominal wall, slightly better than on Monday but not by much Neurology: Alert, awake and following commands.   Assessment/ Plan:  # Acute kidney injury on CKD stage IIIa likely due to combination of obstructive uropathy and ischemic ATN from relative hypotension.  He is status post a bilateral tubal J ureteral stent placement by urology on 12/06. Now has increased urine output and the physical exam consistent with anasarca/fluid overload, mildly improved since stenting. Creat down to 3.9 today. Cont to avoid IV lasix (causes creat bump). Allow for spontaneous diuresis for now.   # Bilateral hydronephrosis: due to extrinsic lymphatic compression status post bilateral ureteral stent by urologist. Hematuria appears to be improving, getting CBI per urology.    # Anion gap metabolic acidosis: Treated with sodium bicarbonate, follow labs.    # Metastatic mantle cell lymphoma: Currently on prednisone and oncology is following.  # Hyponatremia, hypervolemic: holding IV lasix for now.    Vinson Moselle MD  [facility] 10/10/2023, 4:12 PM  Recent Labs  Lab 10/09/23 0130 10/09/23 1910 10/10/23 0322  HGB 4.1* 5.6* 6.6*  ALBUMIN 2.5*  --  2.7*  CALCIUM 7.9*  --  8.2*  PHOS 7.0*  --  5.3*  CREATININE 4.83*  --  3.91*  K 4.8  --   4.0   No results for input(s): "IRON", "TIBC", "FERRITIN" in the last 168 hours. Inpatient medications:  sodium chloride   Intravenous Once   atorvastatin  10 mg Oral QHS   Chlorhexidine Gluconate Cloth  6 each Topical Daily   ciprofloxacin  500 mg Oral Q breakfast   ciprofloxacin  500 mg Oral Daily   docusate sodium  100 mg Oral BID   feeding supplement  237 mL Oral BID BM   multivitamin with minerals  1 tablet Oral Daily   oxybutynin  10 mg Oral QHS   pantoprazole  40 mg Oral Daily   predniSONE  40 mg Oral Q breakfast   senna  1 tablet Oral BID   sodium bicarbonate  650 mg Oral BID   sodium chloride flush  3 mL Intravenous Q12H     acetaminophen **OR** acetaminophen, bisacodyl, HYDROcodone-acetaminophen, ondansetron **OR** ondansetron (ZOFRAN) IV, polyethylene glycol, sodium chloride flush, sodium chloride flush

## 2023-10-11 DIAGNOSIS — N179 Acute kidney failure, unspecified: Secondary | ICD-10-CM | POA: Diagnosis not present

## 2023-10-11 LAB — TYPE AND SCREEN
ABO/RH(D): B POS
Antibody Screen: NEGATIVE
Unit division: 0
Unit division: 0
Unit division: 0
Unit division: 0

## 2023-10-11 LAB — RENAL FUNCTION PANEL
Albumin: 2.7 g/dL — ABNORMAL LOW (ref 3.5–5.0)
Anion gap: 9 (ref 5–15)
BUN: 82 mg/dL — ABNORMAL HIGH (ref 8–23)
CO2: 22 mmol/L (ref 22–32)
Calcium: 8.4 mg/dL — ABNORMAL LOW (ref 8.9–10.3)
Chloride: 103 mmol/L (ref 98–111)
Creatinine, Ser: 2.65 mg/dL — ABNORMAL HIGH (ref 0.61–1.24)
GFR, Estimated: 24 mL/min — ABNORMAL LOW (ref >60)
Glucose, Bld: 88 mg/dL (ref 70–99)
Phosphorus: 3.5 mg/dL (ref 2.5–4.6)
Potassium: 3.5 mmol/L (ref 3.5–5.1)
Sodium: 134 mmol/L — ABNORMAL LOW (ref 135–145)

## 2023-10-11 LAB — CBC
HCT: 23 % — ABNORMAL LOW (ref 39.0–52.0)
Hemoglobin: 7.8 g/dL — ABNORMAL LOW (ref 13.0–17.0)
MCH: 28.8 pg (ref 26.0–34.0)
MCHC: 33.9 g/dL (ref 30.0–36.0)
MCV: 84.9 fL (ref 80.0–100.0)
Platelets: 52 10*3/uL — ABNORMAL LOW (ref 150–400)
RBC: 2.71 MIL/uL — ABNORMAL LOW (ref 4.22–5.81)
RDW: 16.2 % — ABNORMAL HIGH (ref 11.5–15.5)
WBC: 4.6 10*3/uL (ref 4.0–10.5)
nRBC: 1.3 % — ABNORMAL HIGH (ref 0.0–0.2)

## 2023-10-11 LAB — BPAM RBC
Blood Product Expiration Date: 202501132359
Blood Product Expiration Date: 202412242359
Blood Product Expiration Date: 202501122359
Blood Product Expiration Date: 202501132359
ISSUE DATE / TIME: 202412111824
ISSUE DATE / TIME: 202412100841
ISSUE DATE / TIME: 202412101228
ISSUE DATE / TIME: 202412102133
Unit Type and Rh: 7300
Unit Type and Rh: 7300
Unit Type and Rh: 7300
Unit Type and Rh: 7300

## 2023-10-11 MED ORDER — ZINC OXIDE 40 % EX OINT
TOPICAL_OINTMENT | Freq: Two times a day (BID) | CUTANEOUS | Status: DC
  Administered 2023-10-12 – 2023-10-14 (×5): 1 via TOPICAL
  Filled 2023-10-11 (×3): qty 57

## 2023-10-11 NOTE — Progress Notes (Signed)
PT Cancellation Note  Patient Details Name: Jerry Tran MRN: [id number] DOB: Jul 21, 1946   Cancelled Treatment:    Reason Eval/Treat Not Completed: Fatigue/lethargy limiting ability to participate (pt sleeping soundly, requested PT check back tomorrow as he feels he needs to rest today. Will follow.)   Tamala Ser PT 10/11/2023  Acute Rehabilitation Services  Office [phone]

## 2023-10-11 NOTE — Progress Notes (Signed)
6 Days Post-Op Subjective: Pt resting comfortably on rounds. Hematuria has returned mildly. CBI off  Objective: Vital signs in last 24 hours: Temp:  [98 F (36.7 C)-98.6 F (37 C)] 98.1 F (36.7 C) (12/12 0423) Pulse Rate:  [70-84] 71 (12/12 0423) Resp:  [20-22] 20 (12/12 0423) BP: (99-115)/(60-64) 99/61 (12/12 0423) SpO2:  [97 %-100 %] 97 % (12/12 0423)  Assessment/Plan: # Bilateral hydronephrosis  # Penoscrotal edema  2/2 extrinsic lymphatic compression  S/p bilateral ureteral stent placement with Dr. Alvester Morin on 12/6 Diffuse anasarca.  Recommend keeping scrotum/penis elevated.      #Hematuria #ABLA #Pulmonary embolism #AoCKD  3 filling defects present on VQ scan, consistent with PE. As these appear to be subsegmental.  Hematuria 2/2 heparin infusion in the context of indwelling ureteral stents.  This has resolved with the discontinuation of his heparin gtt. and associated retroperitoneal bleed. Trend Hgb.  Appears stable on review of today's labs.        93f hematuria catheter placed 12/8. Will consider voiding trial tomorrow Patient continues to make daily improvement in his serum creatinine.  Continue to trend labs    Intake/Output from previous day: 12/11 0701 - 12/12 0700 In: 1520 [P.O.:1080; I.V.:50; Blood:390] Out: 3650 [Urine:3650]  Intake/Output this shift: Total I/O In: 3 [I.V.:3] Out: 550 [Urine:550]  Physical Exam:  General: Sleeping CV: No cyanosis Lungs: equal chest rise Gu: 60f three-way hematuria catheter in place.  CBI off. Light pink urine  Lab Results: Recent Labs    10/09/23 1910 10/10/23 0322 10/11/23 0425  HGB 5.6* 6.6* 7.8*  HCT 16.4* 19.5* 23.0*   BMET Recent Labs    10/10/23 0322 10/11/23 0424  NA 129* 134*  K 4.0 3.5  CL 97* 103  CO2 21* 22  GLUCOSE 102* 88  BUN 106* 82*  CREATININE 3.91* 2.65*  CALCIUM 8.2* 8.4*     Studies/Results: CT ABDOMEN PELVIS WO CONTRAST Result Date: 10/09/2023 CLINICAL DATA:   Retroperitoneal bleed suspected.  Lymphoma. EXAM: CT ABDOMEN AND PELVIS WITHOUT CONTRAST TECHNIQUE: Multidetector CT imaging of the abdomen and pelvis was performed following the standard protocol without IV contrast. RADIATION DOSE REDUCTION: This exam was performed according to the departmental dose-optimization program which includes automated exposure control, adjustment of the mA and/or kV according to patient size and/or use of iterative reconstruction technique. COMPARISON:  CT abdomen pelvis dated 09/28/2023. FINDINGS: Evaluation of this exam is limited in the absence of intravenous contrast. Lower chest: Partially visualized small bilateral pleural effusions with partial compressive atelectasis of the lower lobes. Pneumonia is not excluded. The tip of a central venous line noted at the cavoatrial junction. There is hypoattenuation of the cardiac blood pool suggestive of anemia. Clinical correlation is recommended. No intra-abdominal free air.  Small ascites. Hepatobiliary: Several small liver cysts. No biliary dilatation. No calcified gallstone. Pancreas: Unremarkable. No pancreatic ductal dilatation or surrounding inflammatory changes. Spleen: Normal in size without focal abnormality. Adrenals/Urinary Tract: Bilateral pigtail ureteral stents noted with proximal tips in the renal pelvis and distal end in the urinary bladder. There is mild bilateral hydronephrosis similar to prior CT. No stone noted. The urinary bladder is decompressed around a Foley catheter. Stomach/Bowel: Small hiatal hernia. Mildly thickened small bowel loops, likely related to ascites and anasarca. Enteritis is less likely. There is no bowel obstruction. The appendix is normal. Vascular/Lymphatic: Mild aortoiliac atherosclerotic disease. The IVC is unremarkable. No portal venous gas. Bulky retroperitoneal and bilateral pelvic sidewall adenopathy. Right inguinal adenopathy as seen previously. Reproductive:  Several biopsy clips in the  prostate gland. Other: There is diffuse subcutaneous edema and anasarca, significantly progressed since the prior CT. Enlargement of the psoas muscles with ill-defined higher attenuation suspicious for intramuscular hemorrhage. Musculoskeletal: Osteopenia with degenerative changes of the spine. No acute osseous pathology. IMPRESSION: 1. Enlargement of the psoas muscles suspicious for intramuscular hemorrhage. 2. Bulky retroperitoneal and bilateral pelvic sidewall adenopathy. 3. Bilateral pigtail ureteral stents with mild bilateral hydronephrosis similar to prior CT. 4. Small ascites. Diffuse subcutaneous edema and anasarca, significantly progressed since the prior CT. 5. Partially visualized small bilateral pleural effusions with partial compressive atelectasis of the lower lobes. Pneumonia is not excluded. 6.  Aortic Atherosclerosis (ICD10-I70.0). Electronically Signed   By: Elgie Collard M.D.   On: 10/09/2023 20:26      LOS: 13 days   Elmon Kirschner, NP [facility] Pager: [phone]  10/11/2023, 12:02 PM

## 2023-10-11 NOTE — TOC Progression Note (Signed)
Transition of Care [facility]) - Progression Note    Patient Details  Name: Jerry Tran MRN: [id number] Date of Birth: 18-Sep-1946  [facility] CM/SW Contact  Larrie Kass, LCSW Phone Number: 10/11/2023, 10:26 AM  Clinical Narrative:     CSW discussed short-term rehab rec with the patient at the bedside. The patient has agreed to placement. CSW will send pt's information to the [place] for authorization. TOC to follow.  Expected Discharge Plan: Skilled Nursing Facility Barriers to Discharge: Continued Medical Work up  Expected Discharge Plan and Services       Living arrangements for the past 2 months: Apartment                                       Social Determinants of Health (SDOH) Interventions SDOH Screenings   Food Insecurity: No Food Insecurity (09/29/2023)  Housing: Low Risk  (09/29/2023)  Transportation Needs: No Transportation Needs (09/29/2023)  Utilities: Not At Risk (09/29/2023)  Alcohol Screen: Low Risk  (08/08/2023)  Depression (PHQ2-9): Low Risk  (04/03/2023)  Tobacco Use: Medium Risk (10/05/2023)  Health Literacy: Adequate Health Literacy (08/08/2023)    Readmission Risk Interventions     No data to display

## 2023-10-11 NOTE — Progress Notes (Signed)
PROGRESS NOTE    Jerry Tran  WUJ:[id number] DOB: [birth year] DOA: 09/28/2023 PCP: [facility]   Brief Narrative:  77 y.o. male with medical history significant of mantle cell lymphoma, CKD, diastolic CHF, A-fib, HLD presented with edema, leg swelling and groin swelling for the past 2 weeks and worsening shortness of breath.  On presentation, BUN/creatinine were more than 120 and 3.  CT of abdomen showed progressive adenopathy along with new bilateral hydroureteronephrosis secondary to compressive effect lower bladder thickening and anasarca.  Oncology and urology were consulted.  He underwent ureteric stent placement for extrinsic compression on 10/05/2023.  On 10/06/2023, he was found to be more tachypneic: VQ scan was suggestive of 3 moderate-sized subsegmental perfusion defects compatible with PE.  He was started on heparin drip.  He subsequently had hematuria requiring CBI.  On 10/09/2023, hemoglobin was found to be 4.1: He was transfused 3 units packed red cells.  Palliative care also consulted.  Assessment & Plan:   Acute blood loss anemia on top of anemia of chronic disease -Possibly from hematuria and retroperitoneal bleeding.  Hemoglobin was found to be 4.1 on 10/19/2023: Status post 4 units packed red cells transfusion.  Hemoglobin still 7.8 this morning.  Monitor H&H. -Having intermittent hematuria. -CT of abdomen and pelvis without contrast on 10/09/2023 suggested possible intramuscular hemorrhage in the psoas muscles with bilateral ureteral stents with mild bilateral hydronephrosis -Hold heparin.  Acute hypoxic respiratory failure Possible PE with bilateral acute lower extremity DVT -VQ scan as above.  Heparin plan as above. -Currently on 1-2 L oxygen via nasal cannula.  AKI on CKD stage IIIa Acute metabolic acidosis -Due to combination of obstructive uropathy and ischemic ATN from relative hypotension -Creatinine improving to 2.65 today.  Nephrology following.   Follow recommendations of Lasix  Bilateral hydroureteronephrosis Possibly UTI Hematuria -Due to compression from mantle cell metastasis -Status post ureteric stent placement on 10/05/2023.  Urology following. -Still having hematuria.   -Urine culture grew 80,000 colonies per mL of Pseudomonas aeruginosa from 10/06/2023.  Currently on oral Cipro  Hyponatremia -Sodium 134 today.  Follow nephrology recommendations  Thrombocytopenia -Due to mantle cell lymphoma.  Platelets worsening.  Monitor  Extensive retroperitoneal and pelvic lymphadenopathy Lymphedema Mantle cell lymphoma Goals of care -Likely due to significant involvement of abdominal and pelvic lymph nodes by his mantle cell lymphoma -Oncology following intermittently.  Currently on prednisone.  Oncology recommended hospice.   -Palliative care following.  Patient/family leaning towards hospice but have not made final decision yet.  Currently still full code.    DVT prophylaxis: Off heparin drip Code Status: Full Family Communication: Brother on phone on 10/10/2023 Disposition Plan: Status is: Inpatient Remains inpatient appropriate because: Of severity of illness  Consultants: Oncology/urology/nephrology/palliative care  Procedures: None  Antimicrobials:  Anti-infectives (From admission, onward)    Start     Dose/Rate Route Frequency Ordered Stop   10/10/23 1430  ciprofloxacin (CIPRO) tablet 500 mg        500 mg Oral Daily 10/10/23 1332     10/09/23 0800  ciprofloxacin (CIPRO) tablet 500 mg  Status:  Discontinued        500 mg Oral Daily with breakfast 10/08/23 1035 10/08/23 1049   10/08/23 1145  ciprofloxacin (CIPRO) tablet 500 mg        500 mg Oral Daily with breakfast 10/08/23 1049 10/11/23 0759   10/07/23 1800  amoxicillin-clavulanate (AUGMENTIN) 500-125 MG per tablet 1 tablet  Status:  Discontinued  1 tablet Oral Every 12 hours 10/07/23 1333 10/08/23 1035   10/06/23 1100  Ampicillin-Sulbactam (UNASYN) 3 g  in sodium chloride 0.9 % 100 mL IVPB  Status:  Discontinued        3 g 200 mL/hr over 30 Minutes Intravenous Every 12 hours 10/06/23 0948 10/07/23 1333   10/05/23 1038  ceFAZolin (ANCEF) IVPB 2g/100 mL premix        2 g 200 mL/hr over 30 Minutes Intravenous 30 min pre-op 10/05/23 1038 10/05/23 1549        Subjective: Patient seen and examined at bedside.  No agitation, fever, vomiting reported.  Objective: Vitals:   10/10/23 1845 10/10/23 2012 10/10/23 2145 10/11/23 0423  BP: 106/64 105/61 103/60 99/61  Pulse: 80 84 75 71  Resp: (!) 22  20 20   Temp: 98 F (36.7 C) 98.4 F (36.9 C) 98.6 F (37 C) 98.1 F (36.7 C)  TempSrc: Oral Oral Oral Oral  SpO2: 100% 98% 100% 97%  Weight:      Height:        Intake/Output Summary (Last 24 hours) at 10/11/2023 0801 Last data filed at 10/11/2023 0522 [place] per 24 hour  Intake 1520 ml  Output 3650 ml  Net -2130 ml   Filed Weights   09/29/23 1312 10/05/23 1340 10/10/23 0455  Weight: 104.7 kg 104.7 kg 115.3 kg    Examination:  General: On 1 to 2 L oxygen via nasal cannula.  No distress.  Chronically ill and deconditioned looking. ENT/neck: No thyromegaly.  JVD is not elevated  respiratory: Decreased breath sounds at bases bilaterally with some crackles; no wheezing  CVS: S1-S2 heard, rate controlled currently Abdominal: Soft, nontender, slightly distended; no organomegaly, bowel sounds are heard Extremities: Trace lower extremity edema; no cyanosis  CNS: Alert.  Still very slow to respond and a poor historian.  No focal neurologic deficit.  Moves extremities Lymph: No obvious lymphadenopathy Skin: No obvious ecchymosis/lesions  psych: Mostly flat affect.  Currently not agitated.   Musculoskeletal: No obvious joint swelling/deformity    Data Reviewed: I have personally reviewed following labs and imaging studies  CBC: Recent Labs  Lab 10/06/23 0338 10/07/23 0113 10/07/23 1557 10/09/23 0130 10/09/23 1910 10/10/23 0322  10/11/23 0425  WBC 6.8 8.4  --  9.7  --  6.3 4.6  HGB 8.1* 7.8* 7.7* 4.1* 5.6* 6.6* 7.8*  HCT 25.3* 23.2* 23.2* 12.9* 16.4* 19.5* 23.0*  MCV 88.2 85.6  --  88.4  --  84.4 84.9  PLT 85* 58*  --  69*  --  59* 52*   Basic Metabolic Panel: Recent Labs  Lab 10/07/23 0113 10/08/23 0555 10/09/23 0130 10/10/23 0322 10/11/23 0424  NA 134*  133* 128* 130* 129* 134*  K 4.1  4.1 3.7 4.8 4.0 3.5  CL 103  102 98 99 97* 103  CO2 19*  19* 14* 17* 21* 22  GLUCOSE 104*  106* 174* 150* 102* 88  BUN 104*  97* 97* 111* 106* 82*  CREATININE 4.75*  4.85* 4.12* 4.83* 3.91* 2.65*  CALCIUM 8.1*  8.1* 7.9* 7.9* 8.2* 8.4*  PHOS 5.7* 6.5* 7.0* 5.3* 3.5   GFR: Estimated Creatinine Clearance: 32.4 mL/min (A) (by C-G formula based on SCr of 2.65 mg/dL (H)). Liver Function Tests: Recent Labs  Lab 10/07/23 0113 10/08/23 0555 10/09/23 0130 10/10/23 0322 10/11/23 0424  ALBUMIN 2.8* 2.7* 2.5* 2.7* 2.7*   No results for input(s): "LIPASE", "AMYLASE" in the last 168 hours. No results for input(s): "AMMONIA"  in the last 168 hours. Coagulation Profile: No results for input(s): "INR", "PROTIME" in the last 168 hours. Cardiac Enzymes: No results for input(s): "CKTOTAL", "CKMB", "CKMBINDEX", "TROPONINI" in the last 168 hours. BNP (last 3 results) No results for input(s): "PROBNP" in the last 8760 hours. HbA1C: No results for input(s): "HGBA1C" in the last 72 hours. CBG: No results for input(s): "GLUCAP" in the last 168 hours. Lipid Profile: No results for input(s): "CHOL", "HDL", "LDLCALC", "TRIG", "CHOLHDL", "LDLDIRECT" in the last 72 hours. Thyroid Function Tests: No results for input(s): "TSH", "T4TOTAL", "FREET4", "T3FREE", "THYROIDAB" in the last 72 hours. Anemia Panel: No results for input(s): "VITAMINB12", "FOLATE", "FERRITIN", "TIBC", "IRON", "RETICCTPCT" in the last 72 hours. Sepsis Labs: No results for input(s): "PROCALCITON", "LATICACIDVEN" in the last 168 hours.  Recent Results  (from the past 240 hours)  Urine Culture (for pregnant, neutropenic or urologic patients or patients with an indwelling urinary catheter)     Status: Abnormal   Collection Time: 10/06/23  8:43 AM   Specimen: Urine, Clean Catch  Result Value Ref Range Status   Specimen Description   Final    URINE, CLEAN CATCH Performed at [facility], [address], [place], [place] [address]    Special Requests   Final    NONE Performed at [facility], [address], [place], [place] [address]    Culture 80,000 COLONIES/mL PSEUDOMONAS AERUGINOSA (A)  Final   Report Status 10/08/2023 FINAL  Final   Organism ID, Bacteria PSEUDOMONAS AERUGINOSA (A)  Final      Susceptibility   Pseudomonas aeruginosa - MIC*    CEFTAZIDIME 4 SENSITIVE Sensitive     CIPROFLOXACIN <=0.25 SENSITIVE Sensitive     GENTAMICIN <=1 SENSITIVE Sensitive     IMIPENEM 1 SENSITIVE Sensitive     PIP/TAZO <=4 SENSITIVE Sensitive ug/mL    CEFEPIME 2 SENSITIVE Sensitive     * 80,000 COLONIES/mL PSEUDOMONAS AERUGINOSA         Radiology Studies: CT ABDOMEN PELVIS WO CONTRAST Result Date: 10/09/2023 CLINICAL DATA:  Retroperitoneal bleed suspected.  Lymphoma. EXAM: CT ABDOMEN AND PELVIS WITHOUT CONTRAST TECHNIQUE: Multidetector CT imaging of the abdomen and pelvis was performed following the standard protocol without IV contrast. RADIATION DOSE REDUCTION: This exam was performed according to the departmental dose-optimization program which includes automated exposure control, adjustment of the mA and/or kV according to patient size and/or use of iterative reconstruction technique. COMPARISON:  CT abdomen pelvis dated 09/28/2023. FINDINGS: Evaluation of this exam is limited in the absence of intravenous contrast. Lower chest: Partially visualized small bilateral pleural effusions with partial compressive atelectasis of the lower lobes. Pneumonia is not excluded. The tip of a central venous line noted  at the cavoatrial junction. There is hypoattenuation of the cardiac blood pool suggestive of anemia. Clinical correlation is recommended. No intra-abdominal free air.  Small ascites. Hepatobiliary: Several small liver cysts. No biliary dilatation. No calcified gallstone. Pancreas: Unremarkable. No pancreatic ductal dilatation or surrounding inflammatory changes. Spleen: Normal in size without focal abnormality. Adrenals/Urinary Tract: Bilateral pigtail ureteral stents noted with proximal tips in the renal pelvis and distal end in the urinary bladder. There is mild bilateral hydronephrosis similar to prior CT. No stone noted. The urinary bladder is decompressed around a Foley catheter. Stomach/Bowel: Small hiatal hernia. Mildly thickened small bowel loops, likely related to ascites and anasarca. Enteritis is less likely. There is no bowel obstruction. The appendix is normal. Vascular/Lymphatic: Mild aortoiliac atherosclerotic disease. The IVC is unremarkable. No  portal venous gas. Bulky retroperitoneal and bilateral pelvic sidewall adenopathy. Right inguinal adenopathy as seen previously. Reproductive: Several biopsy clips in the prostate gland. Other: There is diffuse subcutaneous edema and anasarca, significantly progressed since the prior CT. Enlargement of the psoas muscles with ill-defined higher attenuation suspicious for intramuscular hemorrhage. Musculoskeletal: Osteopenia with degenerative changes of the spine. No acute osseous pathology. IMPRESSION: 1. Enlargement of the psoas muscles suspicious for intramuscular hemorrhage. 2. Bulky retroperitoneal and bilateral pelvic sidewall adenopathy. 3. Bilateral pigtail ureteral stents with mild bilateral hydronephrosis similar to prior CT. 4. Small ascites. Diffuse subcutaneous edema and anasarca, significantly progressed since the prior CT. 5. Partially visualized small bilateral pleural effusions with partial compressive atelectasis of the lower lobes. Pneumonia  is not excluded. 6.  Aortic Atherosclerosis (ICD10-I70.0). Electronically Signed   By: Elgie Collard M.D.   On: 10/09/2023 20:26        Scheduled Meds:  atorvastatin  10 mg Oral QHS   Chlorhexidine Gluconate Cloth  6 each Topical Daily   ciprofloxacin  500 mg Oral Daily   docusate sodium  100 mg Oral BID   feeding supplement  237 mL Oral BID BM   multivitamin with minerals  1 tablet Oral Daily   oxybutynin  10 mg Oral QHS   pantoprazole  40 mg Oral Daily   predniSONE  40 mg Oral Q breakfast   senna  1 tablet Oral BID   sodium bicarbonate  650 mg Oral BID   sodium chloride flush  3 mL Intravenous Q12H   Continuous Infusions:        Glade Lloyd, MD [facility] 10/11/2023, 8:01 AM

## 2023-10-11 NOTE — [place] FL2 (Signed)
[place] MEDICAID FL2 LEVEL OF CARE FORM     IDENTIFICATION  Patient Name: Jerry Tran Birthdate: 05-02-1946 Sex: male Admission Date (Current Location): 09/28/2023  [facility] and [place] Number:      Facility and Address:  [facility],  [address] [place], [place] [address]      Provider Number: [id number]  Attending Physician Name and Address:  Glade Lloyd, MD  Relative Name and Phone Number:  Bradin, Hardin (Spouse)  [phone] (Mobile)    Current Level of Care: Hospital Recommended Level of Care: Skilled Nursing Facility Prior Approval Number:    Date Approved/Denied:   PASRR Number: pending  Discharge Plan: SNF    Current Diagnoses: Patient Active Problem List   Diagnosis Date Noted   Malnutrition of moderate degree 10/08/2023   Other pancytopenia (HCC) 10/03/2023   Anemia associated with chemotherapy 09/29/2023   Bilateral lower extremity edema 09/29/2023   Anasarca 09/29/2023   AKI (acute kidney injury) (HCC) 09/28/2023   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 pylori infection 12/09/2021   Prostate cancer (HCC) 03/2021    Orientation RESPIRATION BLADDER Height & Weight     Self, Time, Situation, Place  Normal Continent Weight: 254 lb 3.1 oz (115.3 kg) Height:  6\' 4"  (193 cm)  BEHAVIORAL SYMPTOMS/MOOD NEUROLOGICAL BOWEL NUTRITION STATUS      Continent Diet (regular)  AMBULATORY STATUS COMMUNICATION  OF NEEDS Skin   Limited Assist Verbally Normal                       Personal Care Assistance Level of Assistance  Bathing, Feeding, Dressing Bathing Assistance: Limited assistance Feeding assistance: Independent Dressing Assistance: Limited assistance     Functional Limitations Info  Sight, Hearing, Speech Sight Info: Adequate Hearing Info: Adequate Speech Info: Adequate    SPECIAL CARE FACTORS FREQUENCY  PT (By licensed PT), OT (By licensed OT)     PT Frequency: 5 x a week OT Frequency: 5 x a week            Contractures Contractures Info: Not present    Additional Factors Info  Code Status, Allergies Code Status Info: full Allergies Info: Sildenafil           Current Medications (10/11/2023):  This is the current hospital active medication list Current Facility-Administered Medications  Medication Dose Route Frequency Provider Last Rate Last Admin   acetaminophen (TYLENOL) tablet 650 mg  650 mg Oral Q6H PRN Doutova, Anastassia, MD       Or   acetaminophen (TYLENOL) suppository 650 mg  650 mg Rectal Q6H PRN Doutova, Anastassia, MD       atorvastatin (LIPITOR) tablet 10 mg  10 mg Oral QHS Doutova, Anastassia, MD   10 mg at 10/10/23 2203   bisacodyl (DULCOLAX) suppository 10 mg  10 mg Rectal Daily PRN Therisa Doyne, MD       Chlorhexidine Gluconate Cloth 2 % PADS 6 each  6 each Topical Daily Lanae Boast, MD   6  each at 10/11/23 0842   ciprofloxacin (CIPRO) tablet 500 mg  500 mg Oral Daily Glade Lloyd, MD   500 mg at 10/11/23 0843   docusate sodium (COLACE) capsule 100 mg  100 mg Oral BID Therisa Doyne, MD   100 mg at 10/10/23 2204   feeding supplement (ENSURE ENLIVE / ENSURE PLUS) liquid 237 mL  237 mL Oral BID BM Rhetta Mura, MD       HYDROcodone-acetaminophen (NORCO/VICODIN) 5-325 MG per tablet 1-2 tablet  1-2 tablet Oral Q4H PRN Therisa Doyne, MD   2 tablet at 10/09/23 1006   multivitamin with minerals tablet 1 tablet  1 tablet  Oral Daily Kc, Dayna Barker, MD   1 tablet at 10/11/23 0842   ondansetron (ZOFRAN) tablet 4 mg  4 mg Oral Q6H PRN Therisa Doyne, MD       Or   ondansetron (ZOFRAN) injection 4 mg  4 mg Intravenous Q6H PRN Doutova, Anastassia, MD       oxybutynin (DITROPAN-XL) 24 hr tablet 10 mg  10 mg Oral QHS Ray Church III, MD   10 mg at 10/10/23 2204   pantoprazole (PROTONIX) EC tablet 40 mg  40 mg Oral Daily Doutova, Anastassia, MD   40 mg at 10/11/23 0843   polyethylene glycol (MIRALAX / GLYCOLAX) packet 17 g  17 g Oral Daily PRN Therisa Doyne, MD   17 g at 10/08/23 1744   predniSONE (DELTASONE) tablet 40 mg  40 mg Oral Q breakfast Doutova, Anastassia, MD   40 mg at 10/11/23 0842   senna (SENOKOT) tablet 8.6 mg  1 tablet Oral BID Therisa Doyne, MD   8.6 mg at 10/10/23 2204   sodium bicarbonate tablet 650 mg  650 mg Oral BID Rhetta Mura, MD   650 mg at 10/11/23 0843   sodium chloride flush (NS) 0.9 % injection 10-40 mL  10-40 mL Intracatheter PRN Kc, Dayna Barker, MD       sodium chloride flush (NS) 0.9 % injection 3 mL  3 mL Intravenous Q12H Doutova, Anastassia, MD   3 mL at 10/11/23 0845   sodium chloride flush (NS) 0.9 % injection 3 mL  3 mL Intravenous PRN Therisa Doyne, MD         Discharge Medications: Please see discharge summary for a list of discharge medications.  Relevant Imaging Results:  Relevant Lab Results:   Additional Information SSN :[id number]  Valentina Shaggy Dejana Pugsley, LCSW

## 2023-10-11 NOTE — Progress Notes (Signed)
Daily Progress Note   Patient Name: Jerry Tran       Date: 10/11/2023 DOB: 1946/09/19  Age: 77 y.o. MRN#: [id number] Attending Physician: Glade Lloyd, MD Primary Care Physician: [facility] Admit Date: 09/28/2023  Reason for Consultation/Follow-up: Establishing goals of care  Subjective: Awake alert, resting in bed.  Patient states he is feeling better today, patient states that he participated well with physical therapy, he wants to go to rehab towards the end of this hospitalization.   Length of Stay: 13  Current Medications: Scheduled Meds:  . atorvastatin  10 mg Oral QHS  . Chlorhexidine Gluconate Cloth  6 each Topical Daily  . ciprofloxacin  500 mg Oral Daily  . docusate sodium  100 mg Oral BID  . feeding supplement  237 mL Oral BID BM  . multivitamin with minerals  1 tablet Oral Daily  . oxybutynin  10 mg Oral QHS  . pantoprazole  40 mg Oral Daily  . predniSONE  40 mg Oral Q breakfast  . senna  1 tablet Oral BID  . sodium bicarbonate  650 mg Oral BID  . sodium chloride flush  3 mL Intravenous Q12H    Continuous Infusions:   PRN Meds: acetaminophen **OR** acetaminophen, bisacodyl, HYDROcodone-acetaminophen, ondansetron **OR** ondansetron (ZOFRAN) IV, polyethylene glycol, sodium chloride flush, sodium chloride flush  Physical Exam         Generalized weakness Diffuse anasarca Regular work of breathing Has some LE edema Awake alert Mood and affect within normal limits today.  Vital Signs: BP 99/61 (BP Location: Right Arm)   Pulse 71   Temp 98.1 F (36.7 C) (Oral)   Resp 20   Ht 6\' 4"  (1.93 m)   Wt 115.3 kg   SpO2 97%   BMI 30.94 kg/m  SpO2: SpO2: 97 % O2 Device: O2 Device: Room Air O2 Flow Rate: O2 Flow Rate (L/min): 1  L/min  Intake/output summary:  Intake/Output Summary (Last 24 hours) at 10/11/2023 1329 Last data filed at 10/11/2023 0847 Gross per 24 hour  Intake 1523 ml  Output 3200 ml  Net -1677 ml   LBM: Last BM Date : 10/11/23 Baseline Weight: Weight: 104.3 kg Most recent weight: Weight: 115.3 kg       Palliative Assessment/Data:      Patient Active Problem List  Diagnosis Date Noted  . Malnutrition of moderate degree 10/08/2023  . Other pancytopenia (HCC) 10/03/2023  . Anemia associated with chemotherapy 09/29/2023  . Bilateral lower extremity edema 09/29/2023  . Anasarca 09/29/2023  . AKI (acute kidney injury) (HCC) 09/28/2023  . Spontaneous tumor lysis syndrome 12/20/2021  . 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 Goals of care, counseling/discussion 12/16/2021  . Acute on chronic diastolic CHF (congestive heart failure) (HCC) 12/11/2021  . Hematuria 12/11/2021  . Heart failure (HCC) 12/10/2021  . Troponin level elevated 12/10/2021  . Lymphadenopathy 12/09/2021  . PTSD (post-traumatic stress disorder) 12/09/2021  . Hypertension 12/09/2021  . Dyslipidemia 12/09/2021  . PAF (paroxysmal atrial fibrillation) (HCC) 12/09/2021  . Chronic kidney disease, stage 3b (HCC) 12/09/2021  . H. pylori infection 12/09/2021  . Prostate cancer (HCC) 03/2021    Palliative Care Assessment & Plan   Patient Profile: 5 bm known PTSD Prostate ca + XRT--Mantel cell lymphoma diagnosed 11/2021 admission during hopsitalization--- follows currently with Dr. Kale/Dr. Basilio [place] and has had XRT Underlying HFpEF  60-65% echo 10/25/22 DD on echo   Previous H. pylori infection 10/2021 Paroxysmal A-fib HTN HLD Initial presentation 11/29 with progressing shortness of breath leg groin swelling in the setting of severe AKI with BUN/creatinine >120 and 3 CT scan progressive adenopathy new bilateral hydroureteronephrosis secondary to compressive effect increased bladder  thickening as well as anasarca Patient was admitted oncology urology consulted   12/6 Ureteric stent placed for extrinsic compression 12/7 early a.m. more tachypneic needing oxygen increased respiratory rate- RENAL US mild hydronephrosis bilaterally Foley catheter in place-- VQ scan turned out positive for 3 small PEs, lower extremity duplex positive for bilateral DVTs Nephrology consulted feels like he has acute kidney injury on CKD 3 AA combination obstructive/ischemic ATN from hypotension 12/8 CBI started secondary to thick hematuria 12/10 hemoglobin 4.1-no source of bleed-transfused 2 units recheck   Assessment: Functional decline Generalized weakness Hypoxic respiratory failure secondary to 3 pulmonary emboli-VQ scan shows this and he also has bilateral lower extremity DVTs  AKI secondary to ischemia hypotension with extrinsic component-peak 93/5.1 BUN/creatinine  multifactorial anasarca with pancytopenia relapsed refractory mantle cell blastoid subtype  Recommendations/Plan: 10-10-2023: Patient is being followed in a multidisciplinary manner by nephrology, medical oncology, urology.  He remains admitted to hospital medicine service.  Overall consensus is for more of a hospice focused approach.  As such, repeat goals of care discussion was requested from palliative services.  Chart reviewed, patient seen and examined, met with patient and wife was also present at bedside. Palliative medicine is specialized medical care for people living with serious illness. It focuses on providing relief from the symptoms and stress of a serious illness. The goal is to improve quality of life for both the patient and the family.  CODE STATUS and goals of care discussions undertaken.  All of the patient's and wife's questions answered to the best of my ability.  Explained to him about multiple serious conditions in the face of underlying malignancy.  Offered time and space for reflection.  Patient states  that he needs some time to think through and to discuss with additional family members.  PMT to follow-up on 10-11-2023. 10-11-23: Patient wishes to continue with full code full scope treatment.  He wishes to go to SNF for rehab and is agreeable to addition of palliative services at [facility] rehab towards the end of this hospitalization.  He states that he does not want to revisit CODE STATUS  discussions with me today.  He believes that he is getting better and that he is doing well today.  Goals of Care and Additional Recommendations: Limitations on Scope of Treatment: Full Scope Treatment  Code Status:    Code Status Orders  (From admission, onward)           Start     Ordered   09/28/23 2347  Full code  Continuous       Question:  By:  Answer:  Consent: discussion documented in EHR   09/28/23 2348           Code Status History     Date Active Date Inactive Code Status Order ID Comments User Context   11/06/2022 1402 11/07/2022 0517 Full Code [id number]  Sterling Big, MD HOV   10/16/2022 1356 10/17/2022 0511 Full Code [id number]  Gilmer Mor, DO HOV   12/09/2021 0841 12/11/2021 2203 Full Code [id number]  Jonah Blue, MD ED       Prognosis:  < 6 months  Discharge Planning: SNF rehab with palliative.   Care plan was discussed with patient    Thank you for allowing the Palliative Medicine Team to assist in the care of this patient.  mod MDM.      Greater than 50%  of this time was spent counseling and coordinating care related to the above assessment and plan.  Rosalin Hawking, MD  Please contact Palliative Medicine Team phone at [phone] for questions and concerns.

## 2023-10-12 DIAGNOSIS — N179 Acute kidney failure, unspecified: Secondary | ICD-10-CM | POA: Diagnosis not present

## 2023-10-12 DIAGNOSIS — Z7189 Other specified counseling: Secondary | ICD-10-CM | POA: Diagnosis not present

## 2023-10-12 DIAGNOSIS — C8318 Mantle cell lymphoma, lymph nodes of multiple sites: Secondary | ICD-10-CM | POA: Diagnosis not present

## 2023-10-12 DIAGNOSIS — D61818 Other pancytopenia: Secondary | ICD-10-CM | POA: Diagnosis not present

## 2023-10-12 LAB — RENAL FUNCTION PANEL
Albumin: 2.8 g/dL — ABNORMAL LOW (ref 3.5–5.0)
Anion gap: 8 (ref 5–15)
BUN: 57 mg/dL — ABNORMAL HIGH (ref 8–23)
CO2: 22 mmol/L (ref 22–32)
Calcium: 8.7 mg/dL — ABNORMAL LOW (ref 8.9–10.3)
Chloride: 104 mmol/L (ref 98–111)
Creatinine, Ser: 1.97 mg/dL — ABNORMAL HIGH (ref 0.61–1.24)
GFR, Estimated: 34 mL/min — ABNORMAL LOW (ref >60)
Glucose, Bld: 135 mg/dL — ABNORMAL HIGH (ref 70–99)
Phosphorus: 2.6 mg/dL (ref 2.5–4.6)
Potassium: 3.1 mmol/L — ABNORMAL LOW (ref 3.5–5.1)
Sodium: 134 mmol/L — ABNORMAL LOW (ref 135–145)

## 2023-10-12 LAB — CBC
HCT: 24.4 % — ABNORMAL LOW (ref 39.0–52.0)
Hemoglobin: 8.2 g/dL — ABNORMAL LOW (ref 13.0–17.0)
MCH: 29.4 pg (ref 26.0–34.0)
MCHC: 33.6 g/dL (ref 30.0–36.0)
MCV: 87.5 fL (ref 80.0–100.0)
Platelets: 59 10*3/uL — ABNORMAL LOW (ref 150–400)
RBC: 2.79 MIL/uL — ABNORMAL LOW (ref 4.22–5.81)
RDW: 16.2 % — ABNORMAL HIGH (ref 11.5–15.5)
WBC: 5.2 10*3/uL (ref 4.0–10.5)
nRBC: 0.4 % — ABNORMAL HIGH (ref 0.0–0.2)

## 2023-10-12 MED ORDER — CIPROFLOXACIN HCL 500 MG PO TABS
500.0000 mg | ORAL_TABLET | Freq: Every day | ORAL | Status: DC
  Administered 2023-10-13 – 2023-10-14 (×2): 500 mg via ORAL
  Filled 2023-10-12 (×2): qty 1

## 2023-10-12 MED ORDER — CIPROFLOXACIN HCL 500 MG PO TABS: 500.0000 mg | ORAL_TABLET | Freq: Two times a day (BID) | ORAL | Status: DC

## 2023-10-12 NOTE — Plan of Care (Signed)

## 2023-10-12 NOTE — TOC Progression Note (Signed)
Transition of Care [facility]) - Progression Note    Patient Details  Name: Jerry Tran MRN: [id number] Date of Birth: 04-Aug-1946  [facility]) CM/SW Contact  Larrie Kass, LCSW Phone Number: 10/12/2023, 1:20 PM  Clinical Narrative:      CSW met with pt and wife over the phone. They have accepted the Bed offer at [facility]. CSW spoke with [facility] admission coordinator, they can accept pt on Monday when medically stable.    Pt and pt's wife was informed the [facility] will not [facility] transportation to the facility. Pt does have [employer] Medicare, however, it was explained that is not a guaranteed the cost will be covered. TOC to follow.    Expected Discharge Plan: Skilled Nursing Facility Barriers to Discharge: Continued Medical Work up  Expected Discharge Plan and Services       Living arrangements for the past 2 months: Apartment                                       Social Determinants of Health (SDOH) Interventions SDOH Screenings   Food Insecurity: No Food Insecurity (09/29/2023)  Housing: Low Risk  (09/29/2023)  Transportation Needs: No Transportation Needs (09/29/2023)  Utilities: Not At Risk (09/29/2023)  Alcohol Screen: Low Risk  (08/08/2023)  Depression (PHQ2-9): Low Risk  (04/03/2023)  Tobacco Use: Medium Risk (10/05/2023)  Health Literacy: Adequate Health Literacy (08/08/2023)    Readmission Risk Interventions     No data to display

## 2023-10-12 NOTE — Plan of Care (Signed)
  Problem: Education: Goal: Knowledge of General Education information will improve Description: Including pain rating scale, medication(s)/side effects and non-pharmacologic comfort measures Outcome: Progressing   Problem: Clinical Measurements: Goal: Diagnostic test results will improve Outcome: Progressing   Problem: Nutrition: Goal: Adequate nutrition will be maintained Outcome: Progressing   Problem: Safety: Goal: Ability to remain free from injury will improve Outcome: Progressing

## 2023-10-12 NOTE — Progress Notes (Signed)
PROGRESS NOTE    Jerry Tran  WJX:[id number] DOB: Jan 08, 1946 DOA: 09/28/2023 PCP: [facility]   Brief Narrative:  77 y.o. male with medical history significant of mantle cell lymphoma, CKD, diastolic CHF, A-fib, HLD presented with edema, leg swelling and groin swelling for the past 2 weeks and worsening shortness of breath.  On presentation, BUN/creatinine were more than 120 and 3.  CT of abdomen showed progressive adenopathy along with new bilateral hydroureteronephrosis secondary to compressive effect lower bladder thickening and anasarca.  Oncology and urology were consulted.  He underwent ureteric stent placement for extrinsic compression on 10/05/2023.  On 10/06/2023, he was found to be more tachypneic: VQ scan was suggestive of 3 moderate-sized subsegmental perfusion defects compatible with PE.  He was started on heparin drip.  He subsequently had hematuria requiring CBI.  On 10/09/2023, hemoglobin was found to be 4.1: He was transfused 3 units packed red cells.  Palliative care also consulted.  Assessment & Plan:   Acute blood loss anemia on top of anemia of chronic disease -Possibly from hematuria and retroperitoneal bleeding.  Hemoglobin was found to be 4.1 on 10/19/2023: Status post 4 units packed red cells transfusion.  Hemoglobin 8.2 this morning.  Monitor H&H. -Having intermittent hematuria. -CT of abdomen and pelvis without contrast on 10/09/2023 suggested possible intramuscular hemorrhage in the psoas muscles with bilateral ureteral stents with mild bilateral hydronephrosis -Heparin remains on hold.  Will follow-up with oncology regarding plan for anticoagulation  Acute hypoxic respiratory failure Possible PE with bilateral acute lower extremity DVT -VQ scan as above.  Heparin plan as above. -Currently on 1-2 L oxygen via nasal cannula.  AKI on CKD stage IIIa Acute metabolic acidosis -Due to combination of obstructive uropathy and ischemic ATN from relative  hypotension -Creatinine improving to 2.65 on 10/11/2023.  Labs pending today.  Nephrology following.  Lasix on hold.  Bilateral hydroureteronephrosis Possibly UTI Hematuria -Due to compression from mantle cell metastasis -Status post ureteric stent placement on 10/05/2023.  Urology following. -Still having hematuria.   -Urine culture grew 80,000 colonies per mL of Pseudomonas aeruginosa from 10/06/2023.  Currently on oral Cipro  Hyponatremia -Sodium pending today.  Follow nephrology recommendations  Thrombocytopenia -Due to mantle cell lymphoma.  Platelets 59 today.  Monitor  Extensive retroperitoneal and pelvic lymphadenopathy Lymphedema Mantle cell lymphoma Goals of care -Likely due to significant involvement of abdominal and pelvic lymph nodes by his mantle cell lymphoma -Oncology following intermittently.  Currently on prednisone.  Oncology recommended hospice.   -Palliative care following.  Patient wants to remain full code and wants to continue current scope of treatment.   DVT prophylaxis: Off heparin drip Code Status: Full Family Communication: Wife at bedside Disposition Plan: Status is: Inpatient Remains inpatient appropriate because: Of severity of illness  Consultants: Oncology/urology/nephrology/palliative care  Procedures: None  Antimicrobials:  Anti-infectives (From admission, onward)    Start     Dose/Rate Route Frequency Ordered Stop   10/10/23 1430  ciprofloxacin (CIPRO) tablet 500 mg        500 mg Oral Daily 10/10/23 1332     10/09/23 0800  ciprofloxacin (CIPRO) tablet 500 mg  Status:  Discontinued        500 mg Oral Daily with breakfast 10/08/23 1035 10/08/23 1049   10/08/23 1145  ciprofloxacin (CIPRO) tablet 500 mg        500 mg Oral Daily with breakfast 10/08/23 1049 10/11/23 0759   10/07/23 1800  amoxicillin-clavulanate (AUGMENTIN) 500-125 MG per tablet 1 tablet  Status:  Discontinued        1 tablet Oral Every 12 hours 10/07/23 1333 10/08/23 1035    10/06/23 1100  Ampicillin-Sulbactam (UNASYN) 3 g in sodium chloride 0.9 % 100 mL IVPB  Status:  Discontinued        3 g 200 mL/hr over 30 Minutes Intravenous Every 12 hours 10/06/23 0948 10/07/23 1333   10/05/23 1038  ceFAZolin (ANCEF) IVPB 2g/100 mL premix        2 g 200 mL/hr over 30 Minutes Intravenous 30 min pre-op 10/05/23 1038 10/05/23 1549        Subjective: Patient seen and examined at bedside.  Denies worsening shortness of breath, fever or vomiting.  Objective: Vitals:   10/11/23 1609 10/11/23 2110 10/12/23 0500 10/12/23 0503  BP: 110/65 116/77  101/65  Pulse: 76 (!) 59  69  Resp: 19 19  20   Temp: 98.2 F (36.8 C) 98.2 F (36.8 C)  98.2 F (36.8 C)  TempSrc: Oral Oral  Oral  SpO2: 96% 100%  100%  Weight:   101.6 kg   Height:        Intake/Output Summary (Last 24 hours) at 10/12/2023 0803 Last data filed at 10/12/2023 0500 Gross per 24 hour  Intake 483 ml  Output 2650 ml  Net -2167 ml   Filed Weights   10/05/23 1340 10/10/23 0455 10/12/23 0500  Weight: 104.7 kg 115.3 kg 101.6 kg    Examination:  General: No acute distress.  Remains on room air.  Chronically ill and deconditioned looking. ENT/neck: No palpable neck masses or JVD elevation noted respiratory: Bilateral decreased breath sounds at bases with some scattered crackles CVS: Rate mostly controlled; S1 and S2 are heard Abdominal: Soft, nontender, distended mildly; no organomegaly, bowel sounds are heard normally Extremities: No clubbing; mild lower extremity edema present  CNS: Awake: Still very slow to respond and a poor historian.  No focal neurologic deficit.  Able to move extremities Lymph: No obvious palpable lymphadenopathy Skin: No obvious petechia/rashes psych: Showing no signs of agitation.  Flat affect currently. Musculoskeletal: No obvious joint erythema/tenderness Genitourinary: Foley catheter present   Data Reviewed: I have personally reviewed following labs and imaging  studies  CBC: Recent Labs  Lab 10/07/23 0113 10/07/23 1557 10/09/23 0130 10/09/23 1910 10/10/23 0322 10/11/23 0425 10/12/23 0445  WBC 8.4  --  9.7  --  6.3 4.6 5.2  HGB 7.8*   < > 4.1* 5.6* 6.6* 7.8* 8.2*  HCT 23.2*   < > 12.9* 16.4* 19.5* 23.0* 24.4*  MCV 85.6  --  88.4  --  84.4 84.9 87.5  PLT 58*  --  69*  --  59* 52* 59*   < > = values in this interval not displayed.   Basic Metabolic Panel: Recent Labs  Lab 10/07/23 0113 10/08/23 0555 10/09/23 0130 10/10/23 0322 10/11/23 0424  NA 134*  133* 128* 130* 129* 134*  K 4.1  4.1 3.7 4.8 4.0 3.5  CL 103  102 98 99 97* 103  CO2 19*  19* 14* 17* 21* 22  GLUCOSE 104*  106* 174* 150* 102* 88  BUN 104*  97* 97* 111* 106* 82*  CREATININE 4.75*  4.85* 4.12* 4.83* 3.91* 2.65*  CALCIUM 8.1*  8.1* 7.9* 7.9* 8.2* 8.4*  PHOS 5.7* 6.5* 7.0* 5.3* 3.5   GFR: Estimated Creatinine Clearance: 28.7 mL/min (A) (by C-G formula based on SCr of 2.65 mg/dL (H)). Liver Function Tests: Recent Labs  Lab 10/07/23 0113 10/08/23 0555  10/09/23 0130 10/10/23 0322 10/11/23 0424  ALBUMIN 2.8* 2.7* 2.5* 2.7* 2.7*   No results for input(s): "LIPASE", "AMYLASE" in the last 168 hours. No results for input(s): "AMMONIA" in the last 168 hours. Coagulation Profile: No results for input(s): "INR", "PROTIME" in the last 168 hours. Cardiac Enzymes: No results for input(s): "CKTOTAL", "CKMB", "CKMBINDEX", "TROPONINI" in the last 168 hours. BNP (last 3 results) No results for input(s): "PROBNP" in the last 8760 hours. HbA1C: No results for input(s): "HGBA1C" in the last 72 hours. CBG: No results for input(s): "GLUCAP" in the last 168 hours. Lipid Profile: No results for input(s): "CHOL", "HDL", "LDLCALC", "TRIG", "CHOLHDL", "LDLDIRECT" in the last 72 hours. Thyroid Function Tests: No results for input(s): "TSH", "T4TOTAL", "FREET4", "T3FREE", "THYROIDAB" in the last 72 hours. Anemia Panel: No results for input(s): "VITAMINB12", "FOLATE",  "FERRITIN", "TIBC", "IRON", "RETICCTPCT" in the last 72 hours. Sepsis Labs: No results for input(s): "PROCALCITON", "LATICACIDVEN" in the last 168 hours.  Recent Results (from the past 240 hours)  Urine Culture (for pregnant, neutropenic or urologic patients or patients with an indwelling urinary catheter)     Status: Abnormal   Collection Time: 10/06/23  8:43 AM   Specimen: Urine, Clean Catch  Result Value Ref Range Status   Specimen Description   Final    URINE, CLEAN CATCH Performed at [facility], [address], [place], [place] [address]    Special Requests   Final    NONE Performed at [facility], [address], [place], [place] [address]    Culture 80,000 COLONIES/mL PSEUDOMONAS AERUGINOSA (A)  Final   Report Status 10/08/2023 FINAL  Final   Organism ID, Bacteria PSEUDOMONAS AERUGINOSA (A)  Final      Susceptibility   Pseudomonas aeruginosa - MIC*    CEFTAZIDIME 4 SENSITIVE Sensitive     CIPROFLOXACIN <=0.25 SENSITIVE Sensitive     GENTAMICIN <=1 SENSITIVE Sensitive     IMIPENEM 1 SENSITIVE Sensitive     PIP/TAZO <=4 SENSITIVE Sensitive ug/mL    CEFEPIME 2 SENSITIVE Sensitive     * 80,000 COLONIES/mL PSEUDOMONAS AERUGINOSA         Radiology Studies: No results found.       Scheduled Meds:  atorvastatin  10 mg Oral QHS   Chlorhexidine Gluconate Cloth  6 each Topical Daily   ciprofloxacin  500 mg Oral Daily   docusate sodium  100 mg Oral BID   feeding supplement  237 mL Oral BID BM   liver oil-zinc oxide   Topical BID   multivitamin with minerals  1 tablet Oral Daily   oxybutynin  10 mg Oral QHS   pantoprazole  40 mg Oral Daily   predniSONE  40 mg Oral Q breakfast   senna  1 tablet Oral BID   sodium bicarbonate  650 mg Oral BID   sodium chloride flush  3 mL Intravenous Q12H   Continuous Infusions:        Glade Lloyd, MD [facility] 10/12/2023, 8:03 AM

## 2023-10-12 NOTE — Progress Notes (Cosign Needed)
Jerry Tran   DOB:1946-01-12   ZO#:[id number]      ASSESSMENT & PLAN:  1.  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  Patient has progressed on these treatments. -Pt was scheduled to have RGemOx combination chemotherapy treatment.  There was also a plan to refer patient to [facility] to reassess his candidacy for CAR-T cell therapy or any other options of therapy. -However given patient's current clinical status, he is not a candidate for further treatment at this time.   -Discussion with patient and wife today regarding holding treatment.  They report they are not ready for hospice and want to see if he will get better for treatment. -Dr. Su Ley Oncologist is following closely.  2.  Extensive retroperitoneal and pelvic lymphadenopathy with swelling -Likely due to to malignancy -CT abdomen pelvis done 10/09/2023 shows diffuse subcutaneous edema and anasarca significantly progressed since prior CT.  -Although patient and wife report that swelling is somewhat improved -Continue to monitor closely  3.  Enlargement of psoas muscles suspicious for intramuscular hemorrhage -Patient was started on heparin which had to be discontinued due to hematuria  4  Anemia and thrombocytopenia -Likely multifactorial due to malignancy, chronic disease -WBC has improved -Hemoglobin 8.2.  Transfuse PRBC for Hgb less than 7.5.  No need to transfuse at this time -Platelets remain low at 59K.  No need for transfusion at this time. -Monitor CBC with differential daily   Code Status Full  Goals of care: Not curable.   Subjective:  Patient seen awake and alert laying in bed.  Spouse at bedside.  Patient appears comfortable in bed.  Reports lymphedema and swelling is getting better.  Admits to increased fatigue with exertion.  No other acute distress is noted.  Objective:  Vitals:    10/11/23 2110 10/12/23 0503  BP: 116/77 101/65  Pulse: (!) 59 69  Resp: 19 20  Temp: 98.2 F (36.8 C) 98.2 F (36.8 C)  SpO2: 100% 100%     Intake/Output Summary (Last 24 hours) at 10/12/2023 9811 Last data filed at 10/12/2023 0500 Gross per 24 hour  Intake 240 ml  Output 2100 ml  Net -1860 ml     REVIEW OF SYSTEMS:   Constitutional: +fatigue, Denies fevers, chills or abnormal night sweats Eyes: Denies blurriness of vision, double vision or watery eyes Ears, nose, mouth, throat, and face: Denies mucositis or sore throat Respiratory: Denies cough, dyspnea or wheezes Cardiovascular: Denies palpitation, chest discomfort or lower extremity swelling Gastrointestinal:  Denies nausea, heartburn or change in bowel habits Skin: Denies abnormal skin rashes Lymphatics: +anasarca, Denies new lymphadenopathy or easy bruising Neurological: Denies numbness, tingling or new weaknesses Behavioral/Psych: Mood is stable, no new changes  All other systems were reviewed with the patient and are negative.  PHYSICAL EXAMINATION: ECOG PERFORMANCE STATUS: 3 - Symptomatic, >50% confined to bed  Vitals:   10/11/23 2110 10/12/23 0503  BP: 116/77 101/65  Pulse: (!) 59 69  Resp: 19 20  Temp: 98.2 F (36.8 C) 98.2 F (36.8 C)  SpO2: 100% 100%   Filed Weights   10/05/23 1340 10/10/23 0455 10/12/23 0500  Weight: 230 lb 13.2 oz (104.7 kg) 254 lb 3.1 oz (115.3 kg) 224 lb (101.6 kg)    GENERAL: +weak appearing, alert, no distress and comfortable SKIN: skin color, texture, turgor are normal, no rashes or significant lesions EYES: normal, conjunctiva are pink and non-injected, sclera clear OROPHARYNX: no exudate, no erythema  and lips, buccal mucosa, and tongue normal  NECK: supple, thyroid normal size, non-tender, without nodularity LYMPH: +bil LE edema   LUNGS: clear to auscultation and percussion with normal breathing effort HEART: regular rate & rhythm and no murmurs and no lower extremity  edema ABDOMEN: abdomen soft, non-tender and normal bowel sounds MUSCULOSKELETAL: no cyanosis of digits and no clubbing  PSYCH: alert & oriented x 3 with fluent speech NEURO: no focal motor/sensory deficits   All questions were answered. The patient knows to call the clinic with any problems, questions or concerns.   The total time spent in the appointment was 40 minutes encounter with patient including review of chart and various tests results, discussions about plan of care and coordination of care plan  [name], NP 10/12/2023 9:09 AM    Labs Reviewed:  Lab Results  Component Value Date   WBC 5.2 10/12/2023   HGB 8.2 (L) 10/12/2023   HCT 24.4 (L) 10/12/2023   MCV 87.5 10/12/2023   PLT 59 (L) 10/12/2023   Recent Labs    09/26/23 1529 09/28/23 2035 09/28/23 2038 09/29/23 0845 09/30/23 0351 10/09/23 0130 10/10/23 0322 10/11/23 0424  NA 138  --    < > 137   < > 130* 129* 134*  K 4.1  --    < > 4.2   < > 4.8 4.0 3.5  CL 105  --    < > 107   < > 99 97* 103  CO2 20*  --    < > 20*   < > 17* 21* 22  GLUCOSE 122*  --    < > 91   < > 150* 102* 88  BUN 71*  --    < > 82*   < > 111* 106* 82*  CREATININE 2.78*  --    < > 2.99*   < > 4.83* 3.91* 2.65*  CALCIUM 9.4  --    < > 9.1   < > 7.9* 8.2* 8.4*  GFRNONAA 23*  --    < > 21*   < > 12* 15* 24*  PROT 7.2 6.8  --  6.6  --   --   --   --   ALBUMIN 4.0 3.7  --  3.6   < > 2.5* 2.7* 2.7*  AST 38 39  --  35  --   --   --   --   ALT 16 15  --  14  --   --   --   --   ALKPHOS 68 63  --  57  --   --   --   --   BILITOT 0.4 0.4  --  0.6  --   --   --   --   BILIDIR  --  <0.1  --   --   --   --   --   --   IBILI  --  NOT CALCULATED  --   --   --   --   --   --    < > = values in this interval not displayed.    Studies Reviewed:  CT ABDOMEN PELVIS WO CONTRAST Result Date: 10/09/2023 CLINICAL DATA:  Retroperitoneal bleed suspected.  Lymphoma. EXAM: CT ABDOMEN AND PELVIS WITHOUT CONTRAST TECHNIQUE: Multidetector CT imaging of the  abdomen and pelvis was performed following the standard protocol without IV contrast. RADIATION DOSE REDUCTION: This exam was performed according to the departmental dose-optimization program which includes  automated exposure control, adjustment of the mA and/or kV according to patient size and/or use of iterative reconstruction technique. COMPARISON:  CT abdomen pelvis dated 09/28/2023. FINDINGS: Evaluation of this exam is limited in the absence of intravenous contrast. Lower chest: Partially visualized small bilateral pleural effusions with partial compressive atelectasis of the lower lobes. Pneumonia is not excluded. The tip of a central venous line noted at the cavoatrial junction. There is hypoattenuation of the cardiac blood pool suggestive of anemia. Clinical correlation is recommended. No intra-abdominal free air.  Small ascites. Hepatobiliary: Several small liver cysts. No biliary dilatation. No calcified gallstone. Pancreas: Unremarkable. No pancreatic ductal dilatation or surrounding inflammatory changes. Spleen: Normal in size without focal abnormality. Adrenals/Urinary Tract: Bilateral pigtail ureteral stents noted with proximal tips in the renal pelvis and distal end in the urinary bladder. There is mild bilateral hydronephrosis similar to prior CT. No stone noted. The urinary bladder is decompressed around a Foley catheter. Stomach/Bowel: Small hiatal hernia. Mildly thickened small bowel loops, likely related to ascites and anasarca. Enteritis is less likely. There is no bowel obstruction. The appendix is normal. Vascular/Lymphatic: Mild aortoiliac atherosclerotic disease. The IVC is unremarkable. No portal venous gas. Bulky retroperitoneal and bilateral pelvic sidewall adenopathy. Right inguinal adenopathy as seen previously. Reproductive: Several biopsy clips in the prostate gland. Other: There is diffuse subcutaneous edema and anasarca, significantly progressed since the prior CT. Enlargement of  the psoas muscles with ill-defined higher attenuation suspicious for intramuscular hemorrhage. Musculoskeletal: Osteopenia with degenerative changes of the spine. No acute osseous pathology. IMPRESSION: 1. Enlargement of the psoas muscles suspicious for intramuscular hemorrhage. 2. Bulky retroperitoneal and bilateral pelvic sidewall adenopathy. 3. Bilateral pigtail ureteral stents with mild bilateral hydronephrosis similar to prior CT. 4. Small ascites. Diffuse subcutaneous edema and anasarca, significantly progressed since the prior CT. 5. Partially visualized small bilateral pleural effusions with partial compressive atelectasis of the lower lobes. Pneumonia is not excluded. 6.  Aortic Atherosclerosis (ICD10-I70.0). Electronically Signed   By: Elgie Collard M.D.   On: 10/09/2023 20:26   VAS [place] LOWER EXTREMITY VENOUS (DVT) Result Date: 10/07/2023  Lower Venous DVT Study Patient Name:  Jerry Tran  Date of Exam:   10/06/2023 Medical Rec #: [id number]     Accession #:    [id number] Date of Birth: 1946/09/28     Patient Gender: M Patient Age:   19 years Exam Location:  [facility] Procedure:      VAS [place] LOWER EXTREMITY VENOUS (DVT) Referring Phys: Rhetta Mura --------------------------------------------------------------------------------  Indications: Swelling, and pulmonary embolism.  Risk Factors: Cancer. Limitations: Body habitus and poor ultrasound/tissue interface. Comparison Study: No prior studies. Performing Technologist: Chanda Busing RVT  Examination Guidelines: A complete evaluation includes B-mode imaging, spectral Doppler, color Doppler, and power Doppler as needed of all accessible portions of each vessel. Bilateral testing is considered an integral part of a complete examination. Limited examinations for reoccurring indications may be performed as noted. The reflux portion of the exam is performed with the patient in reverse Trendelenburg.   +---------+---------------+---------+-----------+----------+-------------------+ RIGHT    CompressibilityPhasicitySpontaneityPropertiesThrombus Aging      +---------+---------------+---------+-----------+----------+-------------------+ CFV      Partial        Yes      Yes                  Acute               +---------+---------------+---------+-----------+----------+-------------------+ FV Prox  None  No       No                   Acute               +---------+---------------+---------+-----------+----------+-------------------+ FV Mid   None           No       No                   Acute               +---------+---------------+---------+-----------+----------+-------------------+ FV DistalNone           No       No                   Acute               +---------+---------------+---------+-----------+----------+-------------------+ PFV                                                   Not well visualized +---------+---------------+---------+-----------+----------+-------------------+ POP      Full           Yes      Yes                                      +---------+---------------+---------+-----------+----------+-------------------+ PTV      Full                                                             +---------+---------------+---------+-----------+----------+-------------------+ PERO     Full                                                             +---------+---------------+---------+-----------+----------+-------------------+ EIV                     Yes      Yes                                      +---------+---------------+---------+-----------+----------+-------------------+ The EIV appears patent.  +---------+---------------+---------+-----------+----------+--------------+ LEFT     CompressibilityPhasicitySpontaneityPropertiesThrombus Aging  +---------+---------------+---------+-----------+----------+--------------+ CFV      Full           Yes      Yes                                 +---------+---------------+---------+-----------+----------+--------------+ SFJ      Full                                                        +---------+---------------+---------+-----------+----------+--------------+  FV Prox  Full                                                        +---------+---------------+---------+-----------+----------+--------------+ FV Mid   Full                                                        +---------+---------------+---------+-----------+----------+--------------+ FV DistalFull                                                        +---------+---------------+---------+-----------+----------+--------------+ PFV      Full                                                        +---------+---------------+---------+-----------+----------+--------------+ POP      None           No       No                   Acute          +---------+---------------+---------+-----------+----------+--------------+ PTV      Full                                                        +---------+---------------+---------+-----------+----------+--------------+ PERO     Full                                                        +---------+---------------+---------+-----------+----------+--------------+ Gastroc  Full                                                        +---------+---------------+---------+-----------+----------+--------------+     Summary: RIGHT: - Findings consistent with acute deep vein thrombosis involving the right common femoral vein, and right femoral vein.  - No cystic structure found in the popliteal fossa.  LEFT: - Findings consistent with acute deep vein thrombosis involving the left popliteal vein.  - No cystic structure found in the popliteal fossa.  *See  table(s) above for measurements and observations. Electronically signed by Carolynn Sayers on 10/07/2023 at 8:57:49 AM.    Final    NM Pulmonary Perfusion Result Date: 10/06/2023 CLINICAL DATA:  Aggressive shortness of breath. Left groin swelling. Severe acute kidney injury. Mantle cell lymphoma. EXAM: NUCLEAR MEDICINE PERFUSION LUNG SCAN TECHNIQUE: Perfusion images were obtained in  multiple projections after intravenous injection of radiopharmaceutical. Ventilation scans intentionally deferred if perfusion scan and chest x-ray adequate for interpretation during COVID 19 epidemic. RADIOPHARMACEUTICALS:  3.95 mCi Tc-45m MAA IV COMPARISON:  Portable chest obtained earlier today. FINDINGS: Moderate-sized subsegmental perfusion defects in the anteromedial left upper lobe, lateral right lower lobe and anteromedial right middle lobe. No corresponding abnormality at these locations on the chest radiograph. IMPRESSION: Three moderate-sized subsegmental perfusion defects with no corresponding radiographic abnormality, compatible with pulmonary embolism. Electronically Signed   By: Beckie Salts M.D.   On: 10/06/2023 14:47   US RENAL Result Date: 10/06/2023 CLINICAL DATA:  "Hydronephrosis".  Lymphoma. EXAM: RENAL / URINARY TRACT ULTRASOUND COMPLETE COMPARISON:  CT 09/28/2023 FINDINGS: Right Kidney: Renal measurements: 10.1 x 4.8 x 6.0 cm = volume: 150 mL. Normal echogenicity. Mild hydronephrosis. Left Kidney: Renal measurements: 12.0 x 5.4 x 5.7 cm = volume: 182 mL. Normal echogenicity. Mild hydronephrosis. Bladder: Foley catheter within. Other: None. IMPRESSION: Similar mild bilateral hydronephrosis. Electronically Signed   By: Jeronimo Greaves M.D.   On: 10/06/2023 09:31   DG CHEST PORT 1 VIEW Result Date: 10/06/2023 CLINICAL DATA:  Pneumonia and shortness of breath EXAM: PORTABLE CHEST 1 VIEW COMPARISON:  09/28/2023 FINDINGS: Right Port-A-Cath tip at superior caval/atrial junction. Midline trachea. Patient rotated  minimally right. Borderline cardiomegaly. Tortuous thoracic aorta. No pleural effusion or pneumothorax. Mild/subtle density projecting along the left heart border and lateral left hemidiaphragm. Right lung clear. IMPRESSION: Subtle density projecting along the left heart border and left hemidiaphragm. Favored to represent interval atelectasis. Given the clinical history, early or resolving pneumonia could look similar. Depending on clinical concern, consider radiographic follow-up at 5-7 days. Electronically Signed   By: Jeronimo Greaves M.D.   On: 10/06/2023 09:25   DG C-Arm 1-60 Min-No Report Result Date: 10/05/2023 Fluoroscopy was utilized by the requesting physician.  No radiographic interpretation.   DG OR UROLOGY CYSTO IMAGE ([facility] ONLY) Result Date: 10/05/2023 There is no interpretation for this exam.  This order is for images obtained during a surgical procedure.  Please See "Surgeries" Tab for more information regarding the procedure.   CT ABDOMEN PELVIS WO CONTRAST Result Date: 09/28/2023 CLINICAL DATA:  Lower extremity and groin swelling for 2 weeks with shortness of breath. Most recent PET-CT with extensive bulky abdominopelvic adenopathy. Currently undergoing XRT for lymphoma. EXAM: CT ABDOMEN AND PELVIS WITHOUT CONTRAST TECHNIQUE: Multidetector CT imaging of the abdomen and pelvis was performed following the standard protocol without IV contrast. RADIATION DOSE REDUCTION: This exam was performed according to the departmental dose-optimization program which includes automated exposure control, adjustment of the mA and/or kV according to patient size and/or use of iterative reconstruction technique. COMPARISON:  PET-CT 08/03/2023, CT chest, abdomen and pelvis with IV contrast 05/29/2023. FINDINGS: Lower chest: No acute abnormality. No lung nodules. The cardiac size is normal. The coronary blood pool is less dense than the myocardium consistent with anemia. No pericardial effusion. Hepatobiliary:  There are several small scattered hepatic cysts, with no follow-up imaging required. No new liver abnormality is seen without contrast. The gallbladder and bile ducts are unremarkable. Pancreas: Unremarkable without contrast. Spleen: Unremarkable without contrast.  No splenomegaly. Adrenals/Urinary Tract: There is no adrenal mass. No contour deforming abnormality of either unenhanced kidneys. Interval new mild bilateral hydroureteronephrosis. There is no urinary stone. This is probably due to compressive effect related to extensive progressive bilateral retroperitoneal adenopathy. The bladder is not fully distended but does appear increasingly thickened, which could be due to cystitis, nondistention,  or XRT. Stomach/Bowel: No dilatation or wall thickening is seen without contrast. There is mild-to-moderate fecal stasis. Diverticulosis is noted without focal colitis or diverticulitis. The appendix is normal. Vascular/Lymphatic: Mild aortic atherosclerosis. No AAA. Extensive progressive retroperitoneal adenopathy. There are multilevel bulky retroperitoneal lymph nodes. No adenopathy was previously seen along the aorta and IVC This is suboptimally evaluated without contrast. Example lymph nodes are as follows: New retrocaval lymph node measuring 2.7 x 1.9 cm on 2:27; New aortocaval lymph node measuring 3.2 x 3.4 cm on 2:30; Numerous other new lymph nodes around the between the IVC and aorta are also noted. There are multiple new left periaortic chain nodes, largest of these is 5.4 x 3.2 cm on 2:36. There is extensive progressive adenopathy in the pelvis. Examples include: A right common iliac chain node measuring 4.9 x 3.9 cm on 2:46, was previously 3.3 x 2.0 cm; Bulky pelvic sidewall nodes with additional progressive lymph nodes in the more distal common iliac chains, for example a left common iliac chain lymph node is estimated 4.9 x 3.5 cm on 2:53, was previously 2.1 x 1.8 cm; There are bulky bilateral external  iliac chain nodes. On the right the largest is 6.1 x 4.6 cm on 2:65, previously 6 x 4.5 cm; Largest of the left external iliac chain nodes is 6.2 x 4.9 cm on 2:61, previously was 5 x 3.7 cm. Bulky bilateral inguinal adenopathy is again noted, largest slightly improved on the right, now 5.6 x 4.0 cm on 2:88, previously was 5.7 x 4.6 cm. Largest left inguinal chain node is less dense than previously possibly due to XRT, today measuring 3.9 x 3.0 cm on 2:84, was previously 5.7 x 4.8 cm. Lymph nodes in medial upper thighs there also slightly improved, on the left measuring 3.6 x 3.0 cm on 2:110, previously 4.0 x 3.6 cm. At the same level on the right, a medial upper thigh lymph node measures 3.0 x 2.6 cm on 2:105, was previously 5.7 x 4.4 cm. 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 The prostate is not enlarged. Other: Minimal pelvic ascites. Diffuse worsening body wall anasarca which could be due to malnutrition, hepatic dysfunction, or fluid overload. There is severe penoscrotal edema, small hydroceles. Mild diffuse haziness to the mesentery is also noted but without focality. Musculoskeletal: Advanced degenerative change lumbar spine. Mild hip DJD. No metastatic bone lesion is seen. Ankylosis right SI joint. IMPRESSION: 1. Extensive progressive retroperitoneal and pelvic adenopathy, with some slightly improved inguinal and medial upper thigh adenopathy. 2. Interval new mild bilateral hydroureteronephrosis, probably due to compressive effect from new extensive retroperitoneal adenopathy. 3. Increasing bladder wall thickening which could be due to cystitis, nondistention, or XRT. 4. Constipation and diverticulosis. 5. Minimal pelvic ascites. 6. Worsening body wall anasarca which could be due to malnutrition, hepatic dysfunction, or fluid overload. 7. Severe penoscrotal edema, small hydroceles. 8. Aortic atherosclerosis. Aortic Atherosclerosis (ICD10-I70.0). Electronically  Signed   By: Almira Bar M.D.   On: 09/28/2023 21:55   DG Chest Port 1 View Result Date: 09/28/2023 CLINICAL DATA:  Shortness of breath with history of lymphoma. Receiving XRT for lymphoma. EXAM: PORTABLE CHEST 1 VIEW COMPARISON:  Chest CT with contrast 05/29/2023 FINDINGS: Right IJ port catheter again terminates at about the superior cavoatrial junction. A skin fold overlies the outer right upper lung field simulating a pneumothorax. No pneumothorax is seen. The lungs are clear. The sulci are sharp. There is mild cardiomegaly. No vascular congestion is seen. Stable  mediastinum with aortic tortuosity and atherosclerosis. DJD both shoulders with no new osseous findings. IMPRESSION: 1. No evidence of acute chest disease. 2. Mild cardiomegaly. 3. Aortic atherosclerosis. 4. Right IJ port catheter. Electronically Signed   By: Almira Bar M.D.   On: 09/28/2023 21:17   ADDENDUM  .Patient was Personally and independently interviewed, examined and relevant elements of the history of present illness were reviewed in details and an assessment and plan was created. All elements of the patient's history of present illness , assessment and plan were discussed in details with Hinton Dyer DNP. The above documentation reflects our combined findings assessment and plan.   Wyvonnia Lora MD MS

## 2023-10-12 NOTE — Plan of Care (Signed)
  Problem: Education: Goal: Knowledge of General Education information will improve Description: Including pain rating scale, medication(s)/side effects and non-pharmacologic comfort measures Outcome: Progressing   Problem: Health Behavior/Discharge Planning: Goal: Ability to manage health-related needs will improve Outcome: Progressing   Problem: Activity: Goal: Risk for activity intolerance will decrease Outcome: Progressing   Problem: Nutrition: Goal: Adequate nutrition will be maintained Outcome: Progressing   Problem: Coping: Goal: Level of anxiety will decrease Outcome: Progressing   Problem: Pain Management: Goal: General experience of comfort will improve Outcome: Progressing

## 2023-10-12 NOTE — Progress Notes (Signed)
Physical Therapy Treatment Patient Details Name: Willa Stockstill MRN: [id number] DOB: 05-22-1946 Today's Date: 10/12/2023   History of Present Illness 77 yo male admitted to hospital 09/28/23 with dyspnea, LE + groin swelling. Found to have AKI, lymphadenopathy in setting of lymphoma. Hx mantle cell lymphoma, CKD, CHF, Afib, PTSD, prostate Ca s/p radiation, arthritis. Eval and d/c on 09/30/23. S/P bil ureteral stent placement 12/6. Acute PE and bil LE DVTs 10/06/23. Anemia, hematuria witih imaging on 12/10 showing possible intramuscular hemorrhage psoas muscles. Reorder PT eval 10/10/23 2* medical and functional decline. Palliative care now involved.    PT Comments  Pt is making significant progress with mobility, he ambulated 150' with RW, no loss of balance. He reports supine to sit and sit to stand are parts of mobility that are most challenging 2* scrotal swelling. He was able to perform those activities without assistance, but with increased time/effort, use of bedrail and head of bed up. Pt is pleasant and motivated, he puts forth good effort. He will likely not need post acute PT follow up.      If plan is discharge home, recommend the following: A little help with bathing/dressing/bathroom;Assistance with cooking/housework;Assist for transportation;Help with stairs or ramp for entrance   Can travel by private vehicle     Yes  Equipment Recommendations  Rolling walker (2 wheels)    Recommendations for Other Services       Precautions / Restrictions Precautions Precautions: Fall Precaution Comments: significant scrotal and LE edema Restrictions Weight Bearing Restrictions Per Provider Order: No     Mobility  Bed Mobility   Bed Mobility: Supine to Sit, Sit to Supine     Supine to sit: Modified independent (Device/Increase time), HOB elevated, Used rails Sit to supine: Min assist   General bed mobility comments: increased time due to scrotal swelling and discomfort, rest break  needed once at EOB; min A for RLE into bed    Transfers Overall transfer level: Needs assistance Equipment used: Rolling walker (2 wheels) Transfers: Sit to/from Stand Sit to Stand: Contact guard assist           General transfer comment: no assist needed    Ambulation/Gait Ambulation/Gait assistance: Supervision Gait Distance (Feet): 150 Feet Assistive device: Rolling walker (2 wheels) Gait Pattern/deviations: Wide base of support, Step-through pattern Gait velocity: WFL     General Gait Details: steady, no loss of balande, pt denies discomfort while walking   Stairs             Wheelchair Mobility     Tilt Bed    Modified Rankin (Stroke Patients Only)       Balance Overall balance assessment: Needs assistance Sitting-balance support: Feet supported, No upper extremity supported Sitting balance-Leahy Scale: Good     Standing balance support: Bilateral upper extremity supported, Reliant on assistive device for balance Standing balance-Leahy Scale: Fair                              Cognition Arousal: Alert Behavior During Therapy: WFL for tasks assessed/performed, Anxious Overall Cognitive Status: Within Functional Limits for tasks assessed                                 General Comments: Oriented x4, able to follow commands without difficulty        Exercises      General Comments  Pertinent Vitals/Pain Pain Assessment Faces Pain Scale: Hurts little more Pain Location: scrotum with supine to sit and sit to stand, no pain at rest in bed nor with walking Pain Descriptors / Indicators: Grimacing, Guarding Pain Intervention(s): Limited activity within patient's tolerance, Monitored during session, Repositioned    Home Living                          Prior Function            PT Goals (current goals can now be found in the care plan section) Acute Rehab PT Goals Patient Stated Goal: to be  able to exercise PT Goal Formulation: With patient/family Time For Goal Achievement: 10/24/23 Potential to Achieve Goals: Good Progress towards PT goals: Progressing toward goals    Frequency    Min 1X/week      PT Plan      Co-evaluation              AM-PAC PT "6 Clicks" Mobility   Outcome Measure  Help needed turning from your back to your side while in a flat bed without using bedrails?: A Little Help needed moving from lying on your back to sitting on the side of a flat bed without using bedrails?: A Little Help needed moving to and from a bed to a chair (including a wheelchair)?: A Little Help needed standing up from a chair using your arms (e.g., wheelchair or bedside chair)?: A Little Help needed to walk in hospital room?: None Help needed climbing 3-5 steps with a railing? : A Little 6 Click Score: 19    End of Session Equipment Utilized During Treatment: Gait belt Activity Tolerance: Patient tolerated treatment well Patient left: in bed;with call bell/phone within reach;with bed alarm set;with family/visitor present Nurse Communication: Mobility status PT Visit Diagnosis: Muscle weakness (generalized) (M62.81);Difficulty in walking, not elsewhere classified (R26.2)     Time: 1610-9604 PT Time Calculation (min) (ACUTE ONLY): 19 min  Charges:    $Gait Training: 8-22 mins PT General Charges $$ ACUTE PT VISIT: 1 Visit                     Tamala Ser PT 10/12/2023  Acute Rehabilitation Services  Office [phone]

## 2023-10-12 NOTE — Progress Notes (Signed)
[facility] Progress Note   Subjective: Pt seen in room. UOP 3.2 L yesterday. Weight down to 101kg (peak 115kg  ?accurate). Creat continues to drop , 1.97 today.   Vitals:   10/11/23 2110 10/12/23 0500 10/12/23 0503 10/12/23 1202  BP: 116/77  101/65 104/67  Pulse: (!) 59  69 98  Resp: 19  20 20   Temp: 98.2 F (36.8 C)  98.2 F (36.8 C) 98.2 F (36.8 C)  TempSrc: Oral  Oral Oral  SpO2: 100%  100% 95%  Weight:  101.6 kg    Height:        Exam: General:NAD, comfortable Heart:RRR, s1s2 nl Lungs:clear b/l, no crackle Abdomen:soft, mild distention and abdomen wall has edema. Extremities: 2-3+ bilat LE pitting edema of LE's and scrotum Neurology: Alert, awake and following commands.   Assessment/ Plan:  # Acute kidney injury on CKD stage IIIa likely due to combination of obstructive uropathy and ischemic ATN from relative hypotension.  He is status post a bilateral tubal J ureteral stent placement by urology on 12/06. Creat peaked at 5.12 on 12/07 and since stenting continues to improve down to 2.6 yest and 1.9 today. IV lasix caused bump up in creat so avoiding for now.   # Bilateral hydronephrosis: due to extrinsic lymphatic compression status post bilateral ureteral stent by urologist. Hematuria mostly resolved, sp CBI.     # Volume overload: mostly below the belt w/ scrotal and LE edema which was massive and still is quite prominent. Weights have not improved yet. Spont diuresis appears to be sustained for now w/ 3-4 L per day UOP. Continue. Consider lasix assistance when creat has reached a plateau.   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 Na 134, resolved.     Vinson Moselle MD  [facility] 10/11/2023, 2:35 PM  Recent Labs  Lab 10/10/23 0322 10/11/23 0424 10/11/23 0425  HGB 6.6*  --  7.8*  ALBUMIN 2.7* 2.7*  --   CALCIUM 8.2* 8.4*  --   PHOS 5.3* 3.5  --   CREATININE 3.91* 2.65*  --    K 4.0 3.5  --    No results for input(s): "IRON", "TIBC", "FERRITIN" in the last 168 hours. Inpatient medications:  atorvastatin  10 mg Oral QHS   Chlorhexidine Gluconate Cloth  6 each Topical Daily   [START ON 10/13/2023] ciprofloxacin  500 mg Oral Q breakfast   docusate sodium  100 mg Oral BID   feeding supplement  237 mL Oral BID BM   liver oil-zinc oxide   Topical BID   multivitamin with minerals  1 tablet Oral Daily   oxybutynin  10 mg Oral QHS   pantoprazole  40 mg Oral Daily   predniSONE  40 mg Oral Q breakfast   senna  1 tablet Oral BID   sodium bicarbonate  650 mg Oral BID   sodium chloride flush  3 mL Intravenous Q12H     acetaminophen **OR** acetaminophen, bisacodyl, HYDROcodone-acetaminophen, ondansetron **OR** ondansetron (ZOFRAN) IV, polyethylene glycol, sodium chloride flush, sodium chloride flush

## 2023-10-12 NOTE — TOC Progression Note (Signed)
Transition of Care [facility]) - Progression Note    Patient Details  Name: Jerry Tran MRN: [id number] Date of Birth: Mar 25, 1946  Transition of Care [facility]) CM/SW Contact  Larrie Kass, LCSW Phone Number: 10/12/2023, 10:00 AM  Clinical Narrative:     CSW received a call from [place] with [facility], pt was approved for 30 days of rehab, CSW will start bed search. TOC to follow.   Expected Discharge Plan: Skilled Nursing Facility Barriers to Discharge: Continued Medical Work up  Expected Discharge Plan and Services       Living arrangements for the past 2 months: Apartment                                       Social Determinants of Health (SDOH) Interventions SDOH Screenings   Food Insecurity: No Food Insecurity (09/29/2023)  Housing: Low Risk  (09/29/2023)  Transportation Needs: No Transportation Needs (09/29/2023)  Utilities: Not At Risk (09/29/2023)  Alcohol Screen: Low Risk  (08/08/2023)  Depression (PHQ2-9): Low Risk  (04/03/2023)  Tobacco Use: Medium Risk (10/05/2023)  Health Literacy: Adequate Health Literacy (08/08/2023)    Readmission Risk Interventions     No data to display

## 2023-10-12 NOTE — Progress Notes (Addendum)
[facility] Progress Note   Subjective: Pt seen in room. UOP 4 L yesterday. Creat down 2.5 today.   Vitals:   10/10/23 2145 10/11/23 0423 10/11/23 1609 10/11/23 2110  BP: 103/60 99/61 110/65 116/77  Pulse: 75 71 76 (!) 59  Resp: 20 20 19 19   Temp: 98.6 F (37 C) 98.1 F (36.7 C) 98.2 F (36.8 C) 98.2 F (36.8 C)  TempSrc: Oral Oral Oral Oral  SpO2: 100% 97% 96% 100%  Weight:      Height:        Exam: General:NAD, comfortable Heart:RRR, s1s2 nl Lungs:clear b/l, no crackle Abdomen:soft, mild distention and abdomen wall has edema. Extremities: 2-3+ bilat LE pitting edema of LE's and scrotum Neurology: Alert, awake and following commands.   Assessment/ Plan:  # Acute kidney injury on CKD stage IIIa likely due to combination of obstructive uropathy and ischemic ATN from relative hypotension.  He is status post a bilateral tubal J ureteral stent placement by urology on 12/06. Creat peaked at 5.12 on 12/07 and since stenting continues to improve down to 2.6 this am. IV lasix caused bump up in creat so will cont to avoid for now.   # Bilateral hydronephrosis: due to extrinsic lymphatic compression status post bilateral ureteral stent by urologist. Hematuria mostly resolved, sp CBI.     # Volume overload: mostly below the belt w/ scrotal and LE edema which was massive and still is quite prominent. Weights have not improved yet. Spont diuresis appears to be sustained for now, .   # Metastatic mantle cell lymphoma: Currently on prednisone and oncology is following.  # Hyponatremia, hypervolemic: spont diuresis as above. Na 134, resolved.     Vinson Moselle MD  [facility] 10/11/2023, 2:35 PM  Recent Labs  Lab 10/10/23 0322 10/11/23 0424 10/11/23 0425  HGB 6.6*  --  7.8*  ALBUMIN 2.7* 2.7*  --   CALCIUM 8.2* 8.4*  --   PHOS 5.3* 3.5  --   CREATININE 3.91* 2.65*  --   K 4.0 3.5  --    No results for input(s): "IRON", "TIBC", "FERRITIN" in the last 168  hours. Inpatient medications:  atorvastatin  10 mg Oral QHS   Chlorhexidine Gluconate Cloth  6 each Topical Daily   ciprofloxacin  500 mg Oral Daily   docusate sodium  100 mg Oral BID   feeding supplement  237 mL Oral BID BM   liver oil-zinc oxide   Topical BID   multivitamin with minerals  1 tablet Oral Daily   oxybutynin  10 mg Oral QHS   pantoprazole  40 mg Oral Daily   predniSONE  40 mg Oral Q breakfast   senna  1 tablet Oral BID   sodium bicarbonate  650 mg Oral BID   sodium chloride flush  3 mL Intravenous Q12H     acetaminophen **OR** acetaminophen, bisacodyl, HYDROcodone-acetaminophen, ondansetron **OR** ondansetron (ZOFRAN) IV, polyethylene glycol, sodium chloride flush, sodium chloride flush

## 2023-10-13 DIAGNOSIS — R601 Generalized edema: Secondary | ICD-10-CM | POA: Diagnosis not present

## 2023-10-13 DIAGNOSIS — C8318 Mantle cell lymphoma, lymph nodes of multiple sites: Secondary | ICD-10-CM | POA: Diagnosis not present

## 2023-10-13 DIAGNOSIS — Z7189 Other specified counseling: Secondary | ICD-10-CM | POA: Diagnosis not present

## 2023-10-13 DIAGNOSIS — N179 Acute kidney failure, unspecified: Secondary | ICD-10-CM | POA: Diagnosis not present

## 2023-10-13 LAB — CBC
HCT: 28.6 % — ABNORMAL LOW (ref 39.0–52.0)
Hemoglobin: 9.1 g/dL — ABNORMAL LOW (ref 13.0–17.0)
MCH: 28.3 pg (ref 26.0–34.0)
MCHC: 31.8 g/dL (ref 30.0–36.0)
MCV: 89.1 fL (ref 80.0–100.0)
Platelets: 72 10*3/uL — ABNORMAL LOW (ref 150–400)
RBC: 3.21 MIL/uL — ABNORMAL LOW (ref 4.22–5.81)
RDW: 16.8 % — ABNORMAL HIGH (ref 11.5–15.5)
WBC: 8 10*3/uL (ref 4.0–10.5)
nRBC: 0 % (ref 0.0–0.2)

## 2023-10-13 LAB — RENAL FUNCTION PANEL
Albumin: 3.2 g/dL — ABNORMAL LOW (ref 3.5–5.0)
Anion gap: 10 (ref 5–15)
BUN: 52 mg/dL — ABNORMAL HIGH (ref 8–23)
CO2: 22 mmol/L (ref 22–32)
Calcium: 8.7 mg/dL — ABNORMAL LOW (ref 8.9–10.3)
Chloride: 103 mmol/L (ref 98–111)
Creatinine, Ser: 1.81 mg/dL — ABNORMAL HIGH (ref 0.61–1.24)
GFR, Estimated: 38 mL/min — ABNORMAL LOW (ref >60)
Glucose, Bld: 112 mg/dL — ABNORMAL HIGH (ref 70–99)
Phosphorus: 2.6 mg/dL (ref 2.5–4.6)
Potassium: 3.3 mmol/L — ABNORMAL LOW (ref 3.5–5.1)
Sodium: 135 mmol/L (ref 135–145)

## 2023-10-13 LAB — MAGNESIUM: Magnesium: 1.7 mg/dL (ref 1.7–2.4)

## 2023-10-13 MED ORDER — MELATONIN 5 MG PO TABS
5.0000 mg | ORAL_TABLET | Freq: Every evening | ORAL | Status: DC | PRN
  Administered 2023-10-13: 5 mg via ORAL
  Filled 2023-10-13: qty 1

## 2023-10-13 NOTE — Plan of Care (Signed)
  Problem: Education: Goal: Knowledge of General Education information will improve Description: Including pain rating scale, medication(s)/side effects and non-pharmacologic comfort measures Outcome: Progressing   Problem: Activity: Goal: Risk for activity intolerance will decrease Outcome: Progressing   Problem: Nutrition: Goal: Adequate nutrition will be maintained Outcome: Progressing   Problem: Coping: Goal: Level of anxiety will decrease Outcome: Progressing   Problem: Elimination: Goal: Will not experience complications related to urinary retention Outcome: Progressing   Problem: Pain Management: Goal: General experience of comfort will improve Outcome: Progressing   Problem: Safety: Goal: Ability to remain free from injury will improve Outcome: Progressing   Problem: Skin Integrity: Goal: Risk for impaired skin integrity will decrease Outcome: Progressing

## 2023-10-13 NOTE — Progress Notes (Signed)
Patient ID: Jerry Tran, male   DOB: 1945/11/11, 77 y.o.   MRN: [id number]  Jerry Tran is without complaints this morning.  He is off of CBI and the urine is light pink.   Hgb is up to 9.1.  Cr is down to 1.81  .BP 105/82 (BP Location: Left Arm)   Pulse 93   Temp 97.9 F (36.6 C) (Oral)   Resp 17   Ht 6\' 4"  (1.93 m)   Wt 106.7 kg   SpO2 98%   BMI 28.63 kg/m   Results for orders placed or performed during the hospital encounter of 09/28/23 (from the past 24 hours)  Renal function panel     Status: Abnormal   Collection Time: 10/12/23 11:36 AM  Result Value Ref Range   Sodium 134 (L) 135 - 145 mmol/L   Potassium 3.1 (L) 3.5 - 5.1 mmol/L   Chloride 104 98 - 111 mmol/L   CO2 22 22 - 32 mmol/L   Glucose, Bld 135 (H) 70 - 99 mg/dL   BUN 57 (H) 8 - 23 mg/dL   Creatinine, Ser 4.09 (H) 0.61 - 1.24 mg/dL   Calcium 8.7 (L) 8.9 - 10.3 mg/dL   Phosphorus 2.6 2.5 - 4.6 mg/dL   Albumin 2.8 (L) 3.5 - 5.0 g/dL   GFR, Estimated 34 (L) >60 mL/min   Anion gap 8 5 - 15  CBC     Status: Abnormal   Collection Time: 10/13/23  2:20 AM  Result Value Ref Range   WBC 8.0 4.0 - 10.5 K/uL   RBC 3.21 (L) 4.22 - 5.81 MIL/uL   Hemoglobin 9.1 (L) 13.0 - 17.0 g/dL   HCT 81.1 (L) 91.4 - 78.2 %   MCV 89.1 80.0 - 100.0 fL   MCH 28.3 26.0 - 34.0 pg   MCHC 31.8 30.0 - 36.0 g/dL   RDW 95.6 (H) 21.3 - 08.6 %   Platelets 72 (L) 150 - 400 K/uL   nRBC 0.0 0.0 - 0.2 %  Renal function panel     Status: Abnormal   Collection Time: 10/13/23  2:20 AM  Result Value Ref Range   Sodium 135 135 - 145 mmol/L   Potassium 3.3 (L) 3.5 - 5.1 mmol/L   Chloride 103 98 - 111 mmol/L   CO2 22 22 - 32 mmol/L   Glucose, Bld 112 (H) 70 - 99 mg/dL   BUN 52 (H) 8 - 23 mg/dL   Creatinine, Ser 5.78 (H) 0.61 - 1.24 mg/dL   Calcium 8.7 (L) 8.9 - 10.3 mg/dL   Phosphorus 2.6 2.5 - 4.6 mg/dL   Albumin 3.2 (L) 3.5 - 5.0 g/dL   GFR, Estimated 38 (L) >60 mL/min   Anion gap 10 5 - 15  Magnesium     Status: None   Collection Time: 10/13/23   2:20 AM  Result Value Ref Range   Magnesium 1.7 1.7 - 2.4 mg/dL   PE: WD, WN in NAD GU: urine light pink in foley tubing.   Imp:  1-Hematuria is resolving.   Continue foley  drainage for now.    2-Hydronephrosis.   Cr continues to fall s/p stenting.

## 2023-10-13 NOTE — Progress Notes (Signed)
PROGRESS NOTE    Jerry Tran  ZOX:[id number] DOB: 01-29-1946 DOA: 09/28/2023 PCP: [facility]   Brief Narrative:  77 y.o. male with medical history significant of mantle cell lymphoma, CKD, diastolic CHF, A-fib, HLD presented with edema, leg swelling and groin swelling for the past 2 weeks and worsening shortness of breath.  On presentation, BUN/creatinine were more than 120 and 3.  CT of abdomen showed progressive adenopathy along with new bilateral hydroureteronephrosis secondary to compressive effect lower bladder thickening and anasarca.  Oncology and urology were consulted.  He underwent ureteric stent placement for extrinsic compression on 10/05/2023.  On 10/06/2023, he was found to be more tachypneic: VQ scan was suggestive of 3 moderate-sized subsegmental perfusion defects compatible with PE.  He was started on heparin drip.  He subsequently had hematuria requiring CBI.  On 10/09/2023, hemoglobin was found to be 4.1: He was transfused 3 units packed red cells.  Heparin drip was stopped.  Palliative care also consulted.  Assessment & Plan:   Acute blood loss anemia on top of anemia of chronic disease -Possibly from hematuria and retroperitoneal bleeding.  Hemoglobin was found to be 4.1 on 10/19/2023: Status post 4 units packed red cells transfusion.  Hemoglobin 9.1 this morning.  Monitor H&H. -Having intermittent hematuria. -CT of abdomen and pelvis without contrast on 10/09/2023 suggested possible intramuscular hemorrhage in the psoas muscles with bilateral ureteral stents with mild bilateral hydronephrosis -Heparin remains on hold.  Will follow-up with oncology regarding plan for anticoagulation  Acute hypoxic respiratory failure Possible PE with bilateral acute lower extremity DVT -VQ scan as above.  Heparin plan as above. -Currently on room air  AKI on CKD stage IIIa Acute metabolic acidosis -Due to combination of obstructive uropathy and ischemic ATN from relative  hypotension -Creatinine improving to 1.81 today.  Nephrology following.  Lasix on hold.  Bilateral hydroureteronephrosis Possibly UTI Hematuria -Due to compression from mantle cell metastasis -Status post ureteric stent placement on 10/05/2023.  Urology following. -Still having hematuria.   -Urine culture grew 80,000 colonies per mL of Pseudomonas aeruginosa from 10/06/2023.  Currently on oral Cipro  Hyponatremia -Improved  Hypokalemia -Replace.  Repeat a.m. labs  Thrombocytopenia -Due to mantle cell lymphoma.  Platelets improving to 72 today.  Monitor  Extensive retroperitoneal and pelvic lymphadenopathy Lymphedema Mantle cell lymphoma Goals of care -Likely due to significant involvement of abdominal and pelvic lymph nodes by his mantle cell lymphoma -Oncology following.  Patient is apparently not a candidate for further treatment at this time.  Currently on prednisone.  Oncology recommended hospice.   -Palliative care following.  Patient wants to remain full code and wants to continue current scope of treatment.   DVT prophylaxis: Off heparin drip Code Status: Full Family Communication: Wife at bedside Disposition Plan: Status is: Inpatient Remains inpatient appropriate because: Of severity of illness  Consultants: Oncology/urology/nephrology/palliative care  Procedures: None  Antimicrobials:  Anti-infectives (From admission, onward)    Start     Dose/Rate Route Frequency Ordered Stop   10/13/23 0800  ciprofloxacin (CIPRO) tablet 500 mg        500 mg Oral Daily with breakfast 10/12/23 1506     10/12/23 2000  ciprofloxacin (CIPRO) tablet 500 mg  Status:  Discontinued        500 mg Oral 2 times daily 10/12/23 1505 10/12/23 1506   10/10/23 1430  ciprofloxacin (CIPRO) tablet 500 mg  Status:  Discontinued        500 mg Oral Daily 10/10/23 1332  10/12/23 1505   10/09/23 0800  ciprofloxacin (CIPRO) tablet 500 mg  Status:  Discontinued        500 mg Oral Daily with breakfast  10/08/23 1035 10/08/23 1049   10/08/23 1145  ciprofloxacin (CIPRO) tablet 500 mg        500 mg Oral Daily with breakfast 10/08/23 1049 10/11/23 0759   10/07/23 1800  amoxicillin-clavulanate (AUGMENTIN) 500-125 MG per tablet 1 tablet  Status:  Discontinued        1 tablet Oral Every 12 hours 10/07/23 1333 10/08/23 1035   10/06/23 1100  Ampicillin-Sulbactam (UNASYN) 3 g in sodium chloride 0.9 % 100 mL IVPB  Status:  Discontinued        3 g 200 mL/hr over 30 Minutes Intravenous Every 12 hours 10/06/23 0948 10/07/23 1333   10/05/23 1038  ceFAZolin (ANCEF) IVPB 2g/100 mL premix        2 g 200 mL/hr over 30 Minutes Intravenous 30 min pre-op 10/05/23 1038 10/05/23 1549        Subjective: Patient seen and examined at bedside.  No fever, vomiting, worsening abdominal pain reported. Objective: Vitals:   10/12/23 2017 10/13/23 0412 10/13/23 0500 10/13/23 0554  BP: 102/74 (!) 85/53  105/82  Pulse: (!) 109 93    Resp: 17 17    Temp: 98.2 F (36.8 C) 97.9 F (36.6 C)    TempSrc: Oral Oral    SpO2: 98% 98%    Weight:   106.7 kg   Height:        Intake/Output Summary (Last 24 hours) at 10/13/2023 0800 Last data filed at 10/13/2023 0458 Gross per 24 hour  Intake 480 ml  Output 1650 ml  Net -1170 ml   Filed Weights   10/10/23 0455 10/12/23 0500 10/13/23 0500  Weight: 115.3 kg 101.6 kg 106.7 kg    Examination:  General: On room air currently.  No distress.  Chronically ill and deconditioned looking. ENT/neck: No JVD elevation or palpable thyromegaly noted  respiratory: Decreased breath sounds at bases bilaterally with some crackles  CVS: S1-S2 heard; currently rate controlled  abdominal: Soft, nontender, distal slightly; no organomegaly, normal bowel sounds heard  extremities: Trace lower extremity edema present; no cyanosis  CNS: Awake; answers some questions; still slow to respond; no obvious focal deficits noted  lymph: No lymphadenopathy palpable Skin: No obvious  lesions/ecchymosis  psych: Mostly flat affect.  Not agitated currently.   Musculoskeletal: No obvious joint swelling/deformity  genitourinary: Has a Foley catheter  Data Reviewed: I have personally reviewed following labs and imaging studies  CBC: Recent Labs  Lab 10/09/23 0130 10/09/23 1910 10/10/23 0322 10/11/23 0425 10/12/23 0445 10/13/23 0220  WBC 9.7  --  6.3 4.6 5.2 8.0  HGB 4.1* 5.6* 6.6* 7.8* 8.2* 9.1*  HCT 12.9* 16.4* 19.5* 23.0* 24.4* 28.6*  MCV 88.4  --  84.4 84.9 87.5 89.1  PLT 69*  --  59* 52* 59* 72*   Basic Metabolic Panel: Recent Labs  Lab 10/09/23 0130 10/10/23 0322 10/11/23 0424 10/12/23 1136 10/13/23 0220  NA 130* 129* 134* 134* 135  K 4.8 4.0 3.5 3.1* 3.3*  CL 99 97* 103 104 103  CO2 17* 21* 22 22 22   GLUCOSE 150* 102* 88 135* 112*  BUN 111* 106* 82* 57* 52*  CREATININE 4.83* 3.91* 2.65* 1.97* 1.81*  CALCIUM 7.9* 8.2* 8.4* 8.7* 8.7*  MG  --   --   --   --  1.7  PHOS 7.0* 5.3* 3.5 2.6  2.6   GFR: Estimated Creatinine Clearance: 45.8 mL/min (A) (by C-G formula based on SCr of 1.81 mg/dL (H)). Liver Function Tests: Recent Labs  Lab 10/09/23 0130 10/10/23 0322 10/11/23 0424 10/12/23 1136 10/13/23 0220  ALBUMIN 2.5* 2.7* 2.7* 2.8* 3.2*   No results for input(s): "LIPASE", "AMYLASE" in the last 168 hours. No results for input(s): "AMMONIA" in the last 168 hours. Coagulation Profile: No results for input(s): "INR", "PROTIME" in the last 168 hours. Cardiac Enzymes: No results for input(s): "CKTOTAL", "CKMB", "CKMBINDEX", "TROPONINI" in the last 168 hours. BNP (last 3 results) No results for input(s): "PROBNP" in the last 8760 hours. HbA1C: No results for input(s): "HGBA1C" in the last 72 hours. CBG: No results for input(s): "GLUCAP" in the last 168 hours. Lipid Profile: No results for input(s): "CHOL", "HDL", "LDLCALC", "TRIG", "CHOLHDL", "LDLDIRECT" in the last 72 hours. Thyroid Function Tests: No results for input(s): "TSH", "T4TOTAL",  "FREET4", "T3FREE", "THYROIDAB" in the last 72 hours. Anemia Panel: No results for input(s): "VITAMINB12", "FOLATE", "FERRITIN", "TIBC", "IRON", "RETICCTPCT" in the last 72 hours. Sepsis Labs: No results for input(s): "PROCALCITON", "LATICACIDVEN" in the last 168 hours.  Recent Results (from the past 240 hours)  Urine Culture (for pregnant, neutropenic or urologic patients or patients with an indwelling urinary catheter)     Status: Abnormal   Collection Time: 10/06/23  8:43 AM   Specimen: Urine, Clean Catch  Result Value Ref Range Status   Specimen Description   Final    URINE, CLEAN CATCH Performed at [facility], [address] [place], [place] [address]    Special Requests   Final    NONE Performed at [facility], [address] [place], [place] [address]    Culture 80,000 COLONIES/mL PSEUDOMONAS AERUGINOSA (A)  Final   Report Status 10/08/2023 FINAL  Final   Organism ID, Bacteria PSEUDOMONAS AERUGINOSA (A)  Final      Susceptibility   Pseudomonas aeruginosa - MIC*    CEFTAZIDIME 4 SENSITIVE Sensitive     CIPROFLOXACIN <=0.25 SENSITIVE Sensitive     GENTAMICIN <=1 SENSITIVE Sensitive     IMIPENEM 1 SENSITIVE Sensitive     PIP/TAZO <=4 SENSITIVE Sensitive ug/mL    CEFEPIME 2 SENSITIVE Sensitive     * 80,000 COLONIES/mL PSEUDOMONAS AERUGINOSA         Radiology Studies: No results found.       Scheduled Meds:  atorvastatin  10 mg Oral QHS   Chlorhexidine Gluconate Cloth  6 each Topical Daily   ciprofloxacin  500 mg Oral Q breakfast   docusate sodium  100 mg Oral BID   feeding supplement  237 mL Oral BID BM   liver oil-zinc oxide   Topical BID   multivitamin with minerals  1 tablet Oral Daily   oxybutynin  10 mg Oral QHS   pantoprazole  40 mg Oral Daily   predniSONE  40 mg Oral Q breakfast   senna  1 tablet Oral BID   sodium bicarbonate  650 mg Oral BID   sodium chloride flush  3 mL Intravenous Q12H   Continuous  Infusions:        Glade Lloyd, MD [facility] 10/13/2023, 8:00 AM

## 2023-10-13 NOTE — Plan of Care (Signed)
  Problem: Education: Goal: Knowledge of General Education information will improve Description: Including pain rating scale, medication(s)/side effects and non-pharmacologic comfort measures Outcome: Progressing   Problem: Health Behavior/Discharge Planning: Goal: Ability to manage health-related needs will improve Outcome: Progressing   Problem: Clinical Measurements: Goal: Diagnostic test results will improve Outcome: Progressing   Problem: Activity: Goal: Risk for activity intolerance will decrease Outcome: Progressing   Problem: Nutrition: Goal: Adequate nutrition will be maintained Outcome: Progressing   Problem: Coping: Goal: Level of anxiety will decrease Outcome: Progressing   Problem: Elimination: Goal: Will not experience complications related to urinary retention Outcome: Progressing   Problem: Pain Management: Goal: General experience of comfort will improve Outcome: Progressing

## 2023-10-13 NOTE — Progress Notes (Signed)
Mobility Specialist - Progress Note   10/13/23 1158  Mobility  Activity Ambulated with assistance in hallway  Level of Assistance Standby assist, set-up cues, supervision of patient - no hands on  Assistive Device Front wheel walker  Distance Ambulated (ft) 200 ft  Range of Motion/Exercises Active  Activity Response Tolerated well  Mobility Referral Yes  Mobility visit 1 Mobility  Mobility Specialist Start Time (ACUTE ONLY) 1140  Mobility Specialist Stop Time (ACUTE ONLY) 1158  Mobility Specialist Time Calculation (min) (ACUTE ONLY) 18 min   Pt was found in bed and agreeable to ambulate. No complaints with session. At EOS returned to bed with all needs met. Call bell in reach.  Billey Chang Mobility Specialist

## 2023-10-13 NOTE — Progress Notes (Signed)
[facility] Progress Note   Subjective: Pt seen in room. UOP 1800 yesterday.   Vitals:   10/13/23 0412 10/13/23 0500 10/13/23 0554 10/13/23 1318  BP: (!) 85/53  105/82 99/74  Pulse: 93   81  Resp: 17   18  Temp: 97.9 F (36.6 C)   98.3 F (36.8 C)  TempSrc: Oral   Oral  SpO2: 98%   97%  Weight:  106.7 kg    Height:        Exam: General:NAD, comfortable Heart:RRR, s1s2 nl Lungs:clear b/l, no crackle Abdomen:soft, mild distention and abdomen wall has edema. Extremities: 2-3+ bilat LE pitting edema of LE's and scrotum Neurology: Alert, awake and following commands.   Assessment/ Plan:  # Acute kidney injury on CKD stage IIIa likely due to combination of obstructive uropathy and ischemic ATN from relative hypotension.  S/P bilateral ureteral stent placement by urology on 12/06. Creat peaked at 5.12 on 12/07 and since stenting continues to improve down to 1.9 yest and 1.8 today. Suspect this is nearing baseline, no other suggestions (except for po lasix as below). Will arrange for [facility] f/u in 3-4  wks.   # Bilateral hydronephrosis: due to extrinsic lymphatic compression status post bilateral ureteral stent by urologist. Hematuria mostly resolved, sp CBI.     # Volume overload: w/ marked scrotal and LE edema which was massive but has improved about 50%. Weights are down some. No resp issues. Would recommend, since creatinine appears to be leveling off, starting him on po lasix at 40mg  bid. Primary team can adjust po lasix as needed, have d/w pmd. Please call if any assistance is needed.    # Metastatic mantle cell lymphoma: per oncology   Vinson Moselle MD  [facility] 10/11/2023, 2:35 PM  Recent Labs  Lab 10/10/23 0322 10/11/23 0424 10/11/23 0425  HGB 6.6*  --  7.8*  ALBUMIN 2.7* 2.7*  --   CALCIUM 8.2* 8.4*  --   PHOS 5.3* 3.5  --   CREATININE 3.91* 2.65*  --   K 4.0 3.5  --    No results for input(s): "IRON", "TIBC", "FERRITIN" in the last 168 hours. Inpatient  medications:  atorvastatin  10 mg Oral QHS   Chlorhexidine Gluconate Cloth  6 each Topical Daily   ciprofloxacin  500 mg Oral Q breakfast   docusate sodium  100 mg Oral BID   feeding supplement  237 mL Oral BID BM   liver oil-zinc oxide   Topical BID   multivitamin with minerals  1 tablet Oral Daily   oxybutynin  10 mg Oral QHS   pantoprazole  40 mg Oral Daily   predniSONE  40 mg Oral Q breakfast   senna  1 tablet Oral BID   sodium bicarbonate  650 mg Oral BID   sodium chloride flush  3 mL Intravenous Q12H     acetaminophen **OR** acetaminophen, bisacodyl, HYDROcodone-acetaminophen, ondansetron **OR** ondansetron (ZOFRAN) IV, polyethylene glycol, sodium chloride flush, sodium chloride flush

## 2023-10-14 DIAGNOSIS — N179 Acute kidney failure, unspecified: Secondary | ICD-10-CM | POA: Diagnosis not present

## 2023-10-14 LAB — CBC
HCT: 26 % — ABNORMAL LOW (ref 39.0–52.0)
Hemoglobin: 8.5 g/dL — ABNORMAL LOW (ref 13.0–17.0)
MCH: 29.2 pg (ref 26.0–34.0)
MCHC: 32.7 g/dL (ref 30.0–36.0)
MCV: 89.3 fL (ref 80.0–100.0)
Platelets: 73 10*3/uL — ABNORMAL LOW (ref 150–400)
RBC: 2.91 MIL/uL — ABNORMAL LOW (ref 4.22–5.81)
RDW: 17.1 % — ABNORMAL HIGH (ref 11.5–15.5)
WBC: 8.3 10*3/uL (ref 4.0–10.5)
nRBC: 0 % (ref 0.0–0.2)

## 2023-10-14 LAB — RENAL FUNCTION PANEL
Albumin: 2.7 g/dL — ABNORMAL LOW (ref 3.5–5.0)
Anion gap: 7 (ref 5–15)
BUN: 48 mg/dL — ABNORMAL HIGH (ref 8–23)
CO2: 23 mmol/L (ref 22–32)
Calcium: 8.7 mg/dL — ABNORMAL LOW (ref 8.9–10.3)
Chloride: 106 mmol/L (ref 98–111)
Creatinine, Ser: 1.47 mg/dL — ABNORMAL HIGH (ref 0.61–1.24)
GFR, Estimated: 49 mL/min — ABNORMAL LOW (ref >60)
Glucose, Bld: 103 mg/dL — ABNORMAL HIGH (ref 70–99)
Phosphorus: 3 mg/dL (ref 2.5–4.6)
Potassium: 3 mmol/L — ABNORMAL LOW (ref 3.5–5.1)
Sodium: 136 mmol/L (ref 135–145)

## 2023-10-14 LAB — MAGNESIUM: Magnesium: 2 mg/dL (ref 1.7–2.4)

## 2023-10-14 MED ORDER — POTASSIUM CHLORIDE CRYS ER 20 MEQ PO TBCR: 40.0000 meq | EXTENDED_RELEASE_TABLET | ORAL | Status: DC

## 2023-10-14 MED ORDER — POTASSIUM CHLORIDE CRYS ER 20 MEQ PO TBCR
40.0000 meq | EXTENDED_RELEASE_TABLET | ORAL | Status: AC
  Administered 2023-10-14 (×2): 40 meq via ORAL
  Filled 2023-10-14 (×2): qty 2

## 2023-10-14 MED ORDER — TRAZODONE HCL 50 MG PO TABS
50.0000 mg | ORAL_TABLET | Freq: Every evening | ORAL | Status: DC | PRN
  Administered 2023-10-15: 50 mg via ORAL
  Filled 2023-10-14: qty 1

## 2023-10-14 NOTE — Progress Notes (Signed)
Patient ID: Jerry Tran, male   DOB: January 12, 1946, 77 y.o.   MRN: [id number]  Jerry Tran is without complaints this morning.  He is off of CBI and the urine is light pink.   Hgb is 8.5  Cr is down to 1.47  .BP 95/65 (BP Location: Left Arm)   Pulse 77   Temp 98.2 F (36.8 C) (Oral)   Resp 16   Ht 6\' 4"  (1.93 m)   Wt 103.5 kg   SpO2 98%   BMI 27.77 kg/m   Results for orders placed or performed during the hospital encounter of 09/28/23 (from the past 24 hours)  CBC     Status: Abnormal   Collection Time: 10/14/23  3:32 AM  Result Value Ref Range   WBC 8.3 4.0 - 10.5 K/uL   RBC 2.91 (L) 4.22 - 5.81 MIL/uL   Hemoglobin 8.5 (L) 13.0 - 17.0 g/dL   HCT 37.6 (L) 28.3 - 15.1 %   MCV 89.3 80.0 - 100.0 fL   MCH 29.2 26.0 - 34.0 pg   MCHC 32.7 30.0 - 36.0 g/dL   RDW 76.1 (H) 60.7 - 37.1 %   Platelets 73 (L) 150 - 400 K/uL   nRBC 0.0 0.0 - 0.2 %  Renal function panel     Status: Abnormal   Collection Time: 10/14/23  3:32 AM  Result Value Ref Range   Sodium 136 135 - 145 mmol/L   Potassium 3.0 (L) 3.5 - 5.1 mmol/L   Chloride 106 98 - 111 mmol/L   CO2 23 22 - 32 mmol/L   Glucose, Bld 103 (H) 70 - 99 mg/dL   BUN 48 (H) 8 - 23 mg/dL   Creatinine, Ser 0.62 (H) 0.61 - 1.24 mg/dL   Calcium 8.7 (L) 8.9 - 10.3 mg/dL   Phosphorus 3.0 2.5 - 4.6 mg/dL   Albumin 2.7 (L) 3.5 - 5.0 g/dL   GFR, Estimated 49 (L) >60 mL/min   Anion gap 7 5 - 15  Magnesium     Status: None   Collection Time: 10/14/23  3:32 AM  Result Value Ref Range   Magnesium 2.0 1.7 - 2.4 mg/dL   PE: WD, WN in NAD GU: There is persistent severe edema of the penis.  urine light pink in foley tubing.   Imp:  1-Hematuria is resolving.   Continue foley  drainage for now.    2-Hydronephrosis.   Cr continues to fall s/p stenting.   3- Penile edema.  Stable.   Patient ID: Jerry Tran, male   DOB: [birth year], 77 y.o.   MRN: [id number]

## 2023-10-14 NOTE — Progress Notes (Signed)
Daily Progress Note   Patient Name: Jerry Tran       Date: 10/14/2023 DOB: 1946/06/14  Age: 77 y.o. MRN#: [id number] Attending Physician: Glade Lloyd, MD Primary Care Physician: [facility] Admit Date: 09/28/2023  Reason for Consultation/Follow-up: Establishing goals of care  Subjective: I saw and examined [name] today.  At time of my encounter he is sitting in bed in no distress.  Multiple family members surrounding him and they were having lively conversation when I entered the room.  He reports feeling, "great."  And states that his appetite was a little bit better today.  He feels that he is getting stronger and shows me by moving his legs about in the bed.  He expressed desire to continue to work on strengthening and he is hopeful they will reach a point where his status improves enough that he could pursue further disease modifying therapy.  He is not open other conversation about other possibilities at this point.   Length of Stay: 16  Current Medications: Scheduled Meds:   atorvastatin  10 mg Oral QHS   Chlorhexidine Gluconate Cloth  6 each Topical Daily   ciprofloxacin  500 mg Oral Q breakfast   docusate sodium  100 mg Oral BID   feeding supplement  237 mL Oral BID BM   liver oil-zinc oxide   Topical BID   multivitamin with minerals  1 tablet Oral Daily   oxybutynin  10 mg Oral QHS   pantoprazole  40 mg Oral Daily   predniSONE  40 mg Oral Q breakfast   senna  1 tablet Oral BID   sodium bicarbonate  650 mg Oral BID   sodium chloride flush  3 mL Intravenous Q12H    Continuous Infusions:   PRN Meds: acetaminophen **OR** acetaminophen, bisacodyl, HYDROcodone-acetaminophen, melatonin, ondansetron **OR** ondansetron (ZOFRAN) IV, polyethylene glycol,  sodium chloride flush, sodium chloride flush  Physical Exam         General: Alert, awake, in no acute distress.   HEENT: No bruits, no goiter, no JVD Heart: Regular rate and rhythm. No murmur appreciated. Lungs: Good air movement, clear Abdomen: Soft, nontender, mildly istended, positive bowel sounds.   Ext: + edema Skin: Warm and dry Neuro: Grossly intact, nonfocal.   Vital Signs: BP 95/65 (BP Location: Left Arm)  Pulse 77   Temp 98.2 F (36.8 C) (Oral)   Resp 16   Ht 6\' 4"  (1.93 m)   Wt 103.5 kg   SpO2 98%   BMI 27.77 kg/m  SpO2: SpO2: 98 % O2 Device: O2 Device: Room Air O2 Flow Rate: O2 Flow Rate (L/min): 1 L/min  Intake/output summary:  Intake/Output Summary (Last 24 hours) at 10/14/2023 0853 Last data filed at 10/14/2023 0150 Gross per 24 hour  Intake 963 ml  Output 1100 ml  Net -137 ml   LBM: Last BM Date : 10/13/23 Baseline Weight: Weight: 104.3 kg Most recent weight: Weight: 103.5 kg       Palliative Assessment/Data:      Patient Active Problem List   Diagnosis Date Noted   Malnutrition of moderate degree 10/08/2023   Other pancytopenia (HCC) 10/03/2023   Anemia associated with chemotherapy 09/29/2023   Bilateral lower extremity edema 09/29/2023   Anasarca 09/29/2023   AKI (acute kidney injury) (HCC) 09/28/2023   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 pylori infection 12/09/2021   Prostate cancer (HCC)  03/2021    Palliative Care Assessment & Plan   Patient Profile: 5 bm known PTSD Prostate ca + XRT--Mantel cell lymphoma diagnosed 11/2021 admission during hopsitalization--- follows currently with Dr. Kale/Dr. Basilio Cairo and has had XRT Underlying HFpEF  60-65% echo 10/25/22 DD on echo   Previous H. pylori infection 10/2021 Paroxysmal A-fib HTN HLD Initial presentation 11/29 with progressing shortness of breath leg groin swelling in the setting of severe AKI with BUN/creatinine >120 and 3 CT scan progressive adenopathy new bilateral hydroureteronephrosis secondary to compressive effect increased bladder thickening as well as anasarca Patient was admitted oncology urology consulted   12/6 Ureteric stent placed for extrinsic compression 12/7 early a.m. more tachypneic needing oxygen increased respiratory rate- RENAL US mild hydronephrosis bilaterally Foley catheter in place-- VQ scan turned out positive for 3 small PEs, lower extremity duplex positive for bilateral DVTs Nephrology consulted feels like he has acute kidney injury on CKD 3 AA combination obstructive/ischemic ATN from hypotension 12/8 CBI started secondary to thick hematuria 12/10 hemoglobin 4.1-no source of bleed-transfused 2 units recheck   Assessment: Functional decline Generalized weakness Hypoxic respiratory failure secondary to 3 pulmonary emboli-VQ scan shows this and he also has bilateral lower extremity DVTs  AKI secondary to ischemia hypotension with extrinsic component-peak 93/5.1 BUN/creatinine  multifactorial anasarca with pancytopenia relapsed refractory mantle cell blastoid subtype  Recommendations/Plan: I followed up with [name] today to further discuss goals and CODE STATUS.  At this point, he continues to express desire for full code full scope treatment.  He wishes to go to SNF for rehab and is agreeable to addition of palliative services at SNF rehab at time of discharge.  He states that he is getting tired  of continued discussion about his hopes and CODE STATUS discussions.  Palliative care to follow peripherally moving forward.  Goals of Care and Additional Recommendations: Limitations on Scope of Treatment: Full Scope Treatment  Code Status:    Code Status Orders  (From admission, onward)           Start     Ordered   09/28/23 2347  Full code  Continuous       Question:  By:  Answer:  Consent: discussion documented in EHR   09/28/23 2348           Code Status History     Date Active Date Inactive Code Status Order ID Comments User Context   11/06/2022 1402 11/07/2022 0517 Full Code [id number]  Sterling Big, MD HOV   10/16/2022 1356 10/17/2022 0511 Full Code [id number]  Gilmer Mor, DO HOV   12/09/2021 0841 12/11/2021 2203 Full Code [id number]  Jonah Blue, MD ED       Prognosis:  < 6 months  Discharge Planning: SNF rehab with palliative.   Care plan was discussed with patient    Thank you for allowing the Palliative Medicine Team to assist in the care of this patient.  mod MDM.   Total time 35 minutes.  Greater than 50%  of this time was spent counseling and coordinating care related to the above assessment and plan.  Romie Minus, MD  Please contact Palliative Medicine Team phone at [phone] for questions and concerns.

## 2023-10-14 NOTE — Progress Notes (Addendum)
PROGRESS NOTE    Jerry Tran  ZOX:[id number] DOB: 06-17-1946 DOA: 09/28/2023 PCP: [facility]   Brief Narrative:  77 y.o. male with medical history significant of mantle cell lymphoma, CKD, diastolic CHF, A-fib, HLD presented with edema, leg swelling and groin swelling for the past 2 weeks and worsening shortness of breath.  On presentation, BUN/creatinine were more than 120 and 3.  CT of abdomen showed progressive adenopathy along with new bilateral hydroureteronephrosis secondary to compressive effect lower bladder thickening and anasarca.  Oncology and urology were consulted.  He underwent ureteric stent placement for extrinsic compression on 10/05/2023.  On 10/06/2023, he was found to be more tachypneic: VQ scan was suggestive of 3 moderate-sized subsegmental perfusion defects compatible with PE.  He was started on heparin drip.  He subsequently had hematuria requiring CBI.  On 10/09/2023, hemoglobin was found to be 4.1: He was transfused 3 units packed red cells.  Heparin drip was stopped.  Palliative care also consulted.  Assessment & Plan:   Acute blood loss anemia on top of anemia of chronic disease -Possibly from hematuria and retroperitoneal bleeding.  Hemoglobin was found to be 4.1 on 10/19/2023: Status post 4 units packed red cells transfusion.  Hemoglobin 9.1 this morning.  Monitor H&H. -Having intermittent hematuria. -CT of abdomen and pelvis without contrast on 10/09/2023 suggested possible intramuscular hemorrhage in the psoas muscles with bilateral ureteral stents with mild bilateral hydronephrosis -Heparin remains on hold.  Will follow-up with oncology regarding plan for anticoagulation.  At this time, patient/family want to stay off of anticoagulation  Acute hypoxic respiratory failure Possible PE with bilateral acute lower extremity DVT -VQ scan as above.  Heparin plan as above. -Currently on room air  AKI on CKD stage IIIa Acute metabolic acidosis -Due to  combination of obstructive uropathy and ischemic ATN from relative hypotension -Creatinine improving to 1.47 today.  Nephrology signed off on 10/13/2023 and recommended to start him on Lasix 40 mg p.o. twice a day. BP currently on the lower side.  Will start Lasix if blood pressure improves.  Bilateral hydroureteronephrosis Possibly UTI Hematuria -Due to compression from mantle cell metastasis -Status post ureteric stent placement on 10/05/2023.  Urology following. -Still having hematuria.   -Urine culture grew 80,000 colonies per mL of Pseudomonas aeruginosa from 10/06/2023.  Currently on oral Cipro: Today is day #7.  Will DC antibiotics.  Hyponatremia -Improved  Hypokalemia -Replace.  Repeat a.m. labs  Thrombocytopenia -Due to mantle cell lymphoma.  Platelets improving to 73 today.  Monitor  Extensive retroperitoneal and pelvic lymphadenopathy Lymphedema Mantle cell lymphoma Goals of care -Likely due to significant involvement of abdominal and pelvic lymph nodes by his mantle cell lymphoma -Oncology following.  Patient is apparently not a candidate for further treatment at this time.  Currently on prednisone.  Oncology recommended hospice.   -Palliative care following.  Patient wants to remain full code and wants to continue current scope of treatment.   DVT prophylaxis: Off heparin drip Code Status: Full Family Communication: Wife at bedside on 10/13/2023.  None at bedside today. Disposition Plan: Status is: Inpatient Remains inpatient appropriate because: Of severity of illness  Consultants: Oncology/urology/nephrology/palliative care  Procedures: None  Antimicrobials:  Anti-infectives (From admission, onward)    Start     Dose/Rate Route Frequency Ordered Stop   10/13/23 0800  ciprofloxacin (CIPRO) tablet 500 mg        500 mg Oral Daily with breakfast 10/12/23 1506     10/12/23 2000  ciprofloxacin (  CIPRO) tablet 500 mg  Status:  Discontinued        500 mg Oral 2  times daily 10/12/23 1505 10/12/23 1506   10/10/23 1430  ciprofloxacin (CIPRO) tablet 500 mg  Status:  Discontinued        500 mg Oral Daily 10/10/23 1332 10/12/23 1505   10/09/23 0800  ciprofloxacin (CIPRO) tablet 500 mg  Status:  Discontinued        500 mg Oral Daily with breakfast 10/08/23 1035 10/08/23 1049   10/08/23 1145  ciprofloxacin (CIPRO) tablet 500 mg        500 mg Oral Daily with breakfast 10/08/23 1049 10/11/23 0759   10/07/23 1800  amoxicillin-clavulanate (AUGMENTIN) 500-125 MG per tablet 1 tablet  Status:  Discontinued        1 tablet Oral Every 12 hours 10/07/23 1333 10/08/23 1035   10/06/23 1100  Ampicillin-Sulbactam (UNASYN) 3 g in sodium chloride 0.9 % 100 mL IVPB  Status:  Discontinued        3 g 200 mL/hr over 30 Minutes Intravenous Every 12 hours 10/06/23 0948 10/07/23 1333   10/05/23 1038  ceFAZolin (ANCEF) IVPB 2g/100 mL premix        2 g 200 mL/hr over 30 Minutes Intravenous 30 min pre-op 10/05/23 1038 10/05/23 1549        Subjective: Patient seen and examined at bedside.  Denies worsening shortness of breath, fever, vomiting or abdominal pain.   Objective: Vitals:   10/13/23 1318 10/13/23 2025 10/14/23 0407 10/14/23 0500  BP: 99/74 96/75 95/65    Pulse: 81 86 77   Resp: 18 19 16    Temp: 98.3 F (36.8 C) 97.6 F (36.4 C) 98.2 F (36.8 C)   TempSrc: Oral Oral Oral   SpO2: 97% 100% 98%   Weight:    103.5 kg  Height:        Intake/Output Summary (Last 24 hours) at 10/14/2023 0843 Last data filed at 10/14/2023 0150 Gross per 24 hour  Intake 963 ml  Output 1100 ml  Net -137 ml   Filed Weights   10/12/23 0500 10/13/23 0500 10/14/23 0500  Weight: 101.6 kg 106.7 kg 103.5 kg    Examination:  General: No acute distress.  Remains on room air.  Chronically ill and deconditioned looking. ENT/neck: No obvious neck masses or JVD elevation noted  respiratory: Mild decreased breath sounds at bases with some crackles  CVS: Rate mostly controlled; S1 and  S2 are heard  abdominal: Soft, nontender, remains slightly distended; no organomegaly, bowel sounds are heard  extremities: No clubbing; mild lower extremity edema present CNS: Alert; answers some questions; still slightly slow to respond.  No obvious focal deficits noted  lymph: No palpable lymphadenopathy  skin: No obvious petechiae/rashes psych: Not agitated.  Flat affect mostly. Musculoskeletal: No obvious joint tenderness/deformity  genitourinary: Foley catheter present  Data Reviewed: I have personally reviewed following labs and imaging studies  CBC: Recent Labs  Lab 10/10/23 0322 10/11/23 0425 10/12/23 0445 10/13/23 0220 10/14/23 0332  WBC 6.3 4.6 5.2 8.0 8.3  HGB 6.6* 7.8* 8.2* 9.1* 8.5*  HCT 19.5* 23.0* 24.4* 28.6* 26.0*  MCV 84.4 84.9 87.5 89.1 89.3  PLT 59* 52* 59* 72* 73*   Basic Metabolic Panel: Recent Labs  Lab 10/10/23 0322 10/11/23 0424 10/12/23 1136 10/13/23 0220 10/14/23 0332  NA 129* 134* 134* 135 136  K 4.0 3.5 3.1* 3.3* 3.0*  CL 97* 103 104 103 106  CO2 21* 22 22 22  23  GLUCOSE 102* 88 135* 112* 103*  BUN 106* 82* 57* 52* 48*  CREATININE 3.91* 2.65* 1.97* 1.81* 1.47*  CALCIUM 8.2* 8.4* 8.7* 8.7* 8.7*  MG  --   --   --  1.7 2.0  PHOS 5.3* 3.5 2.6 2.6 3.0   GFR: Estimated Creatinine Clearance: 51.7 mL/min (A) (by C-G formula based on SCr of 1.47 mg/dL (H)). Liver Function Tests: Recent Labs  Lab 10/10/23 0322 10/11/23 0424 10/12/23 1136 10/13/23 0220 10/14/23 0332  ALBUMIN 2.7* 2.7* 2.8* 3.2* 2.7*   No results for input(s): "LIPASE", "AMYLASE" in the last 168 hours. No results for input(s): "AMMONIA" in the last 168 hours. Coagulation Profile: No results for input(s): "INR", "PROTIME" in the last 168 hours. Cardiac Enzymes: No results for input(s): "CKTOTAL", "CKMB", "CKMBINDEX", "TROPONINI" in the last 168 hours. BNP (last 3 results) No results for input(s): "PROBNP" in the last 8760 hours. HbA1C: No results for input(s): "HGBA1C"  in the last 72 hours. CBG: No results for input(s): "GLUCAP" in the last 168 hours. Lipid Profile: No results for input(s): "CHOL", "HDL", "LDLCALC", "TRIG", "CHOLHDL", "LDLDIRECT" in the last 72 hours. Thyroid Function Tests: No results for input(s): "TSH", "T4TOTAL", "FREET4", "T3FREE", "THYROIDAB" in the last 72 hours. Anemia Panel: No results for input(s): "VITAMINB12", "FOLATE", "FERRITIN", "TIBC", "IRON", "RETICCTPCT" in the last 72 hours. Sepsis Labs: No results for input(s): "PROCALCITON", "LATICACIDVEN" in the last 168 hours.  Recent Results (from the past 240 hours)  Urine Culture (for pregnant, neutropenic or urologic patients or patients with an indwelling urinary catheter)     Status: Abnormal   Collection Time: 10/06/23  8:43 AM   Specimen: Urine, Clean Catch  Result Value Ref Range Status   Specimen Description   Final    URINE, CLEAN CATCH Performed at [facility], [address] [place], [place] [address]    Special Requests   Final    NONE Performed at [facility], [address] [place], [place] [address]    Culture 80,000 COLONIES/mL PSEUDOMONAS AERUGINOSA (A)  Final   Report Status 10/08/2023 FINAL  Final   Organism ID, Bacteria PSEUDOMONAS AERUGINOSA (A)  Final      Susceptibility   Pseudomonas aeruginosa - MIC*    CEFTAZIDIME 4 SENSITIVE Sensitive     CIPROFLOXACIN <=0.25 SENSITIVE Sensitive     GENTAMICIN <=1 SENSITIVE Sensitive     IMIPENEM 1 SENSITIVE Sensitive     PIP/TAZO <=4 SENSITIVE Sensitive ug/mL    CEFEPIME 2 SENSITIVE Sensitive     * 80,000 COLONIES/mL PSEUDOMONAS AERUGINOSA         Radiology Studies: No results found.       Scheduled Meds:  atorvastatin  10 mg Oral QHS   Chlorhexidine Gluconate Cloth  6 each Topical Daily   ciprofloxacin  500 mg Oral Q breakfast   docusate sodium  100 mg Oral BID   feeding supplement  237 mL Oral BID BM   liver oil-zinc oxide   Topical BID    multivitamin with minerals  1 tablet Oral Daily   oxybutynin  10 mg Oral QHS   pantoprazole  40 mg Oral Daily   predniSONE  40 mg Oral Q breakfast   senna  1 tablet Oral BID   sodium bicarbonate  650 mg Oral BID   sodium chloride flush  3 mL Intravenous Q12H   Continuous Infusions:        Glade Lloyd, MD [facility] 10/14/2023, 8:43 AM

## 2023-10-14 NOTE — Plan of Care (Signed)
  Problem: Education: Goal: Knowledge of General Education information will improve Description: Including pain rating scale, medication(s)/side effects and non-pharmacologic comfort measures Outcome: Progressing   Problem: Health Behavior/Discharge Planning: Goal: Ability to manage health-related needs will improve Outcome: Progressing   Problem: Activity: Goal: Risk for activity intolerance will decrease Outcome: Progressing   Problem: Coping: Goal: Level of anxiety will decrease Outcome: Progressing   Problem: Elimination: Goal: Will not experience complications related to bowel motility Outcome: Progressing Goal: Will not experience complications related to urinary retention Outcome: Progressing   Problem: Pain Management: Goal: General experience of comfort will improve Outcome: Progressing   Problem: Safety: Goal: Ability to remain free from injury will improve Outcome: Progressing   Problem: Skin Integrity: Goal: Risk for impaired skin integrity will decrease Outcome: Progressing

## 2023-10-15 ENCOUNTER — Telehealth: Payer: Self-pay | Admitting: Radiation Oncology

## 2023-10-15 DIAGNOSIS — N179 Acute kidney failure, unspecified: Secondary | ICD-10-CM | POA: Diagnosis not present

## 2023-10-15 DIAGNOSIS — Z7189 Other specified counseling: Secondary | ICD-10-CM

## 2023-10-15 LAB — BASIC METABOLIC PANEL
Anion gap: 9 (ref 5–15)
BUN: 48 mg/dL — ABNORMAL HIGH (ref 8–23)
CO2: 21 mmol/L — ABNORMAL LOW (ref 22–32)
Calcium: 8.8 mg/dL — ABNORMAL LOW (ref 8.9–10.3)
Chloride: 106 mmol/L (ref 98–111)
Creatinine, Ser: 1.63 mg/dL — ABNORMAL HIGH (ref 0.61–1.24)
GFR, Estimated: 43 mL/min — ABNORMAL LOW (ref >60)
Glucose, Bld: 94 mg/dL (ref 70–99)
Potassium: 3.4 mmol/L — ABNORMAL LOW (ref 3.5–5.1)
Sodium: 136 mmol/L (ref 135–145)

## 2023-10-15 LAB — CBC
HCT: 27.5 % — ABNORMAL LOW (ref 39.0–52.0)
Hemoglobin: 8.9 g/dL — ABNORMAL LOW (ref 13.0–17.0)
MCH: 29.3 pg (ref 26.0–34.0)
MCHC: 32.4 g/dL (ref 30.0–36.0)
MCV: 90.5 fL (ref 80.0–100.0)
Platelets: 74 10*3/uL — ABNORMAL LOW (ref 150–400)
RBC: 3.04 MIL/uL — ABNORMAL LOW (ref 4.22–5.81)
RDW: 17.3 % — ABNORMAL HIGH (ref 11.5–15.5)
WBC: 8.5 10*3/uL (ref 4.0–10.5)
nRBC: 0 % (ref 0.0–0.2)

## 2023-10-15 LAB — MAGNESIUM: Magnesium: 1.9 mg/dL (ref 1.7–2.4)

## 2023-10-15 MED ORDER — SACCHAROMYCES BOULARDII 250 MG PO CAPS
250.0000 mg | ORAL_CAPSULE | Freq: Two times a day (BID) | ORAL | Status: AC
  Administered 2023-10-15 (×2): 250 mg via ORAL
  Filled 2023-10-15 (×2): qty 1

## 2023-10-15 MED ORDER — POTASSIUM CHLORIDE CRYS ER 20 MEQ PO TBCR
40.0000 meq | EXTENDED_RELEASE_TABLET | Freq: Once | ORAL | Status: AC
  Administered 2023-10-15: 40 meq via ORAL
  Filled 2023-10-15: qty 2

## 2023-10-15 MED ORDER — LOPERAMIDE HCL 2 MG PO CAPS
2.0000 mg | ORAL_CAPSULE | Freq: Once | ORAL | Status: AC
  Administered 2023-10-15: 2 mg via ORAL
  Filled 2023-10-15: qty 1

## 2023-10-15 MED ORDER — POTASSIUM CHLORIDE 20 MEQ PO PACK
40.0000 meq | PACK | Freq: Once | ORAL | Status: AC
  Administered 2023-10-15: 40 meq via ORAL
  Filled 2023-10-15: qty 2

## 2023-10-15 NOTE — Progress Notes (Signed)
   10 Days Post-Op Subjective: Pt resting comfortably on rounds. Hematuria has persists mildly. CBI off over weekend  Objective: Vital signs in last 24 hours: Temp:  [97.6 F (36.4 C)-98.6 F (37 C)] 97.6 F (36.4 C) (12/16 0526) Pulse Rate:  [69-75] 75 (12/16 0526) Resp:  [16-20] 20 (12/16 0526) BP: (105-112)/(72-83) 105/72 (12/16 0526) SpO2:  [95 %-100 %] 100 % (12/16 0526) Weight:  [105.7 kg] 105.7 kg (12/16 0500)  Assessment/Plan: # Bilateral hydronephrosis  # Penoscrotal edema  2/2 extrinsic lymphatic compression  S/p bilateral ureteral stent placement with Dr. Alvester Morin on 12/6 Diffuse anasarca.  Recommend keeping scrotum/penis elevated.      #Hematuria #ABLA #Pulmonary embolism #AoCKD  3 filling defects present on VQ scan, consistent with PE. As these appear to be subsegmental.  Hematuria 2/2 heparin infusion in the context of indwelling ureteral stents.  This has resolved with the discontinuation of his heparin gtt. and associated retroperitoneal bleed. HgB remains stable    Slight return of hematuria. Possibly from the hematuria catheter itself. TOV today.  Continue to trend labs. Scr slightly up today, but overall has continued to trend down.   Intake/Output from previous day: 12/15 0701 - 12/16 0700 In: 716 [P.O.:710; I.V.:6] Out: 1300 [Urine:1300]  Intake/Output this shift: No intake/output data recorded.  Physical Exam:  General: Sleeping CV: No cyanosis Lungs: equal chest rise Gu: 17f three-way hematuria catheter in place.  CBI off. Light pink urine  Lab Results: Recent Labs    10/13/23 0220 10/14/23 0332 10/15/23 0338  HGB 9.1* 8.5* 8.9*  HCT 28.6* 26.0* 27.5*   BMET Recent Labs    10/14/23 0332 10/15/23 0338  NA 136 136  K 3.0* 3.4*  CL 106 106  CO2 23 21*  GLUCOSE 103* 94  BUN 48* 48*  CREATININE 1.47* 1.63*  CALCIUM 8.7* 8.8*     Studies/Results: No results found.     LOS: 17 days   Elmon Kirschner, NP [facility] Pager: [phone]  10/15/2023, 10:33 AM

## 2023-10-15 NOTE — Progress Notes (Signed)
Physical Therapy Treatment Patient Details Name: Jerry Tran MRN: [id number] DOB: [birth year] Today's Date: 10/15/2023   History of Present Illness 77 yo male admitted to hospital 09/28/23 with dyspnea, LE + groin swelling. Found to have AKI, lymphadenopathy in setting of lymphoma. Hx mantle cell lymphoma, CKD, CHF, Afib, PTSD, prostate Ca s/p radiation, arthritis. Eval and d/c on 09/30/23. S/P bil ureteral stent placement 12/6. Acute PE and bil LE DVTs 10/06/23. Anemia, hematuria witih imaging on 12/10 showing possible intramuscular hemorrhage psoas muscles. Reorder PT eval 10/10/23 2* medical and functional decline. Palliative care now involved.    PT Comments  Pt agreeable to working with therapy. He reports feeling well on today. He tolerated session well. He is hopeful to d/c to rehab to regain strength and functional independence before returning home. Will continue to follow and progress activity as tolerated.     If plan is discharge home, recommend the following: A little help with bathing/dressing/bathroom;Assistance with cooking/housework;Assist for transportation;Help with stairs or ramp for entrance   Can travel by private vehicle     Yes  Equipment Recommendations  Rolling walker (2 wheels)    Recommendations for Other Services       Precautions / Restrictions Precautions Precautions: Fall Precaution Comments: significant scrotal and LE edema Restrictions Weight Bearing Restrictions Per Provider Order: No     Mobility  Bed Mobility Overal bed mobility: Needs Assistance Bed Mobility: Supine to Sit, Sit to Supine     Supine to sit: Modified independent (Device/Increase time), HOB elevated, Used rails Sit to supine: Min assist   General bed mobility comments: increased time due to scrotal swelling and discomfort, rest break needed once at EOB; min A for RLE into bed    Transfers Overall transfer level: Needs assistance Equipment used: Rolling walker (2  wheels) Transfers: Sit to/from Stand Sit to Stand: Contact guard assist, From elevated surface                Ambulation/Gait Ambulation/Gait assistance: Contact guard assist Gait Distance (Feet): 200 Feet Assistive device: Rolling walker (2 wheels) Gait Pattern/deviations: Step-through pattern, Wide base of support       General Gait Details: RW for ambulation safety. Tolerated distance well. Dyspnea 2/4   Stairs             Wheelchair Mobility     Tilt Bed    Modified Rankin (Stroke Patients Only)       Balance Overall balance assessment: Needs assistance         Standing balance support: Bilateral upper extremity supported, Reliant on assistive device for balance Standing balance-Leahy Scale: Fair                              Cognition Arousal: Alert Behavior During Therapy: WFL for tasks assessed/performed Overall Cognitive Status: Within Functional Limits for tasks assessed                                          Exercises      General Comments        Pertinent Vitals/Pain Pain Assessment Pain Assessment: No/denies pain    Home Living                          Prior Function  PT Goals (current goals can now be found in the care plan section) Progress towards PT goals: Progressing toward goals    Frequency    Min 1X/week      PT Plan      Co-evaluation              AM-PAC PT "6 Clicks" Mobility   Outcome Measure  Help needed turning from your back to your side while in a flat bed without using bedrails?: A Little Help needed moving from lying on your back to sitting on the side of a flat bed without using bedrails?: A Little Help needed moving to and from a bed to a chair (including a wheelchair)?: A Little Help needed standing up from a chair using your arms (e.g., wheelchair or bedside chair)?: A Little Help needed to walk in hospital room?: A Little Help  needed climbing 3-5 steps with a railing? : A Lot 6 Click Score: 17    End of Session   Activity Tolerance: Patient tolerated treatment well Patient left: in bed;with call bell/phone within reach   PT Visit Diagnosis: Muscle weakness (generalized) (M62.81);Difficulty in walking, not elsewhere classified (R26.2)     Time: 7829-5621 PT Time Calculation (min) (ACUTE ONLY): 28 min  Charges:    $Gait Training: 23-37 mins PT General Charges $$ ACUTE PT VISIT: 1 Visit                        Faye Ramsay, PT Acute Rehabilitation  Office: [phone]

## 2023-10-15 NOTE — Progress Notes (Signed)
Nutrition Follow-up  DOCUMENTATION CODES:   Non-severe (moderate) malnutrition in context of chronic illness  INTERVENTION:  -  Regular diet, double portions.  - Ensure Plus High Protein po BID, each supplement provides 350 kcal and 20 grams of protein. - Encourage intake at all meals and of supplements.  - Multivitamin with minerals daily - Monitor weight trends.   NUTRITION DIAGNOSIS:   Moderate Malnutrition related to chronic illness (mantle cell lymphoma) as evidenced by mild fat depletion, mild muscle depletion. *ongoing  GOAL:   Patient will meet greater than or equal to 90% of their needs *progressing  MONITOR:   PO intake, Supplement acceptance, Weight trends  REASON FOR ASSESSMENT:   Consult Assessment of nutrition requirement/status  ASSESSMENT:   Pt with history significant of mantle cell lymphoma, CKD, diastolic CHF, A-fib, HLD presented with edema, leg swelling and groin swelling for the past 2 weeks PTA and worsening shortness of breath  Patient reports his appetite is slowly improving and he has been trying to eat well. Intake has ranged from 0-100% however nothing documented since 12/12 to assess recent intake. He is also noted to be skipping meals often per [employer] ordering system. He reports the Ensure is good and that he continues to drink it. However, per [facility] he is accepting in only intermittently. Didn't accept it yesterday or this AM.  Encouraged patient to aim for 3 meals a day and to consume supplements if skipping meals.  Of note, oncology following and recommending hospice as he is not a candidate for further treatment. Palliative care following and at this time patient remains full code and wants to continue current scope of treatment.   Admit weight: 230# Current weight: 233# I&O's: -12L + for RUE/LUE and RLE/LLE edema  Medications reviewed and include: Colace, Senokot, MVI  Labs reviewed:  K+ 3/4 Creatinine 1.63   Diet Order:    Diet Order             Diet regular Room service appropriate? Yes; Fluid consistency: Thin  Diet effective now                   EDUCATION NEEDS:  Education needs have been addressed  Skin:  Skin Assessment: Reviewed RN Assessment  Last BM:  12/15  Height:  Ht Readings from Last 1 Encounters:  10/05/23 6\' 4"  (1.93 m)   Weight:  Wt Readings from Last 1 Encounters:  10/15/23 105.7 kg   Ideal Body Weight:  91.8 kg  BMI:  Body mass index is 28.36 kg/m.  Estimated Nutritional Needs:  Kcal:  2300-2500 Protein:  115-130 grams Fluid:  > 2 L   Shelle Iron RD, LDN Contact via Secure Chat.

## 2023-10-15 NOTE — TOC Progression Note (Signed)
Transition of Care [facility]) - Progression Note    Patient Details  Name: Jerry Tran MRN: [id number] Date of Birth: 26-Jul-1946  Transition of Care [facility]) CM/SW Contact  Larrie Kass, LCSW Phone Number: 10/15/2023, 10:26 AM  Clinical Narrative:    PASRR number pending. TOC to follow.    Expected Discharge Plan: Skilled Nursing Facility Barriers to Discharge: Continued Medical Work up  Expected Discharge Plan and Services       Living arrangements for the past 2 months: Apartment                                       Social Determinants of Health (SDOH) Interventions SDOH Screenings   Food Insecurity: No Food Insecurity (09/29/2023)  Housing: Low Risk  (09/29/2023)  Transportation Needs: No Transportation Needs (09/29/2023)  Utilities: Not At Risk (09/29/2023)  Alcohol Screen: Low Risk  (08/08/2023)  Depression (PHQ2-9): Low Risk  (04/03/2023)  Tobacco Use: Medium Risk (10/05/2023)  Health Literacy: Adequate Health Literacy (08/08/2023)    Readmission Risk Interventions     No data to display

## 2023-10-15 NOTE — Progress Notes (Signed)
PROGRESS NOTE    Jerry Tran  XLK:[id number] DOB: 1946-07-15 DOA: 09/28/2023 PCP: [name]   Brief Narrative:  This 77 y.o. male with medical history significant of mantle cell lymphoma, CKD IIIA, diastolic CHF, A-fib, HLD presented with B/L leg swelling and groin swelling for the past 2 weeks and worsening shortness of breath. On presentation, BUN/creatinine were more than 120 and 3.0. CT of abdomen showed progressive adenopathy along with new bilateral hydroureteronephrosis secondary to compressive effect lower bladder thickening and anasarca. Oncology and urology were consulted. He underwent ureteric stent placement for extrinsic compression on 10/05/2023. On 10/06/2023, he was found to be more tachypneic: VQ scan was suggestive of 3 moderate-sized subsegmental perfusion defects compatible with PE. He was started on heparin drip. He subsequently had hematuria requiring CBI. On 10/09/2023, hemoglobin was found to be 4.1: He was transfused 3 units packed red cells. Heparin drip was stopped. Palliative care also consulted.    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   Hemoglobin was found to be 4.1 on 10/19/2023: Status post 4 units packed red cells transfusion.   Hemoglobin 8.9 this morning.  Monitor H&H daily. Patient reports having intermittent hematuria. CT A/P without contrast on 10/09/2023 suggested  possible intramuscular hemorrhage in the psoas muscles with bilateral ureteral stents with mild bilateral hydronephrosis. Heparin remains on hold.  Will follow-up with oncology regarding plan for anticoagulation.   At this time, patient/family want to stay off of anticoagulation.   Acute hypoxic respiratory failure: Possible PE with bilateral acute lower extremity DVT. VQ scan : PE+,   Heparin plan as above. Currently on room air   AKI on CKD stage IIIa: Acute metabolic acidosis > Improved. Due to combination of obstructive uropathy and ischemic ATN from relative hypotension Creatinine improving,Nephrology signed off on 10/13/2023 and recommended to start Lasix 40 mg p.o. twice a day.  BP currently on the lower side.  Will start Lasix if blood pressure improves.   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  Urology following. Still having hematuria.   Urine culture grew 80,000 colonies per mL of Pseudomonas aeruginosa from 10/06/2023.   Currently on oral Cipro: completed 7 days.   Hyponatremia: Improving,    Hypokalemia: Replaced.  Continue to monitor.   Thrombocytopenia Due to mantle cell lymphoma.  Platelets improving to 73 today.  Monitor   Extensive retroperitoneal and pelvic lymphadenopathy Lymphedema Mantle cell lymphoma Goals of care Likely due to significant involvement of abdominal and pelvic lymph nodes by his mantle cell lymphoma Oncology following.  Patient is apparently not a candidate for further treatment at this time.  Currently on prednisone.  Oncology recommended hospice.   Palliative care following.  Patient wants to remain full code and wants to continue current scope of treatment.   DVT prophylaxis: SCDs Code Status: Full code Family Communication: No  family at bed side Disposition Plan:  Status is: Inpatient Remains inpatient appropriate  because: Due to severity of illness.   Consultants:  Cardiology Urology Nephrology Palliative care  Procedures:  None Antimicrobials:  Anti-infectives (From admission, onward)    Start     Dose/Rate Route Frequency Ordered Stop   10/13/23 0800  ciprofloxacin (CIPRO) tablet 500 mg  Status:  Discontinued        500 mg Oral Daily with breakfast 10/12/23 1506 10/14/23 0929   10/12/23 2000  ciprofloxacin (CIPRO) tablet 500 mg  Status:  Discontinued        500 mg Oral 2 times daily 10/12/23 1505 10/12/23 1506   10/10/23 1430  ciprofloxacin (CIPRO) tablet 500 mg  Status:  Discontinued        500 mg Oral Daily 10/10/23 1332 10/12/23 1505   10/09/23 0800  ciprofloxacin (CIPRO) tablet 500 mg  Status:  Discontinued        500 mg Oral Daily with breakfast 10/08/23 1035 10/08/23 1049   10/08/23 1145  ciprofloxacin (CIPRO) tablet 500 mg        500 mg Oral Daily with breakfast 10/08/23 1049 10/11/23 0759   10/07/23 1800  amoxicillin-clavulanate (AUGMENTIN) 500-125 MG per tablet 1 tablet  Status:  Discontinued        1 tablet Oral Every 12 hours 10/07/23 1333 10/08/23 1035   10/06/23 1100  Ampicillin-Sulbactam (UNASYN) 3 g in sodium chloride 0.9 % 100 mL IVPB  Status:  Discontinued        3 g 200 mL/hr over 30 Minutes Intravenous Every 12 hours 10/06/23 0948 10/07/23 1333   10/05/23 1038  ceFAZolin (ANCEF) IVPB 2g/100 mL premix        2 g 200 mL/hr over 30 Minutes Intravenous 30 min pre-op 10/05/23 1038 10/05/23 1549      Subjective: Patient was seen and examined at bedside.  Overnight events noted. Renal functions are improving.  Patient still has hematuria but is improving.   Heparin is on hold.  Patient participated in physical therapy sessions,  He appeared short of breath after finishing PT session.  Objective: Vitals:   10/14/23 1222 10/14/23 2100 10/15/23 0500 10/15/23 0526  BP: 107/72 112/83  105/72  Pulse: 69 75  75  Resp: 18 16  20   Temp: 98.6 F (37 C) 97.7 F (36.5 C)   97.6 F (36.4 C)  TempSrc: Oral Oral  Oral  SpO2: 95% 100%  100%  Weight:   105.7 kg   Height:        Intake/Output Summary (Last 24 hours) at 10/15/2023 1342 Last data filed at 10/15/2023 0239 Gross per 24 hour  Intake 353 ml  Output 800 ml  Net -447 ml   Filed Weights   10/13/23 0500 10/14/23 0500 10/15/23 0500  Weight: 106.7 kg 103.5 kg 105.7 kg    Examination:  General exam: Appears calm and comfortable , deconditioned, not in any acute distress. Respiratory system: Clear to auscultation. Respiratory effort normal. RR 15 Cardiovascular system: S1 & S2 heard, RRR. No JVD, murmurs, rubs, gallops or clicks. No pedal edema. Gastrointestinal system: Abdomen is non distended, soft and non tender. Normal bowel sounds heard. Central nervous system: Alert and oriented X 3. No focal neurological deficits. Extremities:  Edema++, No cyanosis, No clubbing Skin: No rashes, lesions or ulcers Psychiatry: Judgement and insight appear normal. Mood & affect appropriate.     Data Reviewed: I have personally reviewed following labs and imaging studies  CBC: Recent  Labs  Lab 10/11/23 0425 10/12/23 0445 10/13/23 0220 10/14/23 0332 10/15/23 0338  WBC 4.6 5.2 8.0 8.3 8.5  HGB 7.8* 8.2* 9.1* 8.5* 8.9*  HCT 23.0* 24.4* 28.6* 26.0* 27.5*  MCV 84.9 87.5 89.1 89.3 90.5  PLT 52* 59* 72* 73* 74*   Basic Metabolic Panel: Recent Labs  Lab 10/10/23 0322 10/11/23 0424 10/12/23 1136 10/13/23 0220 10/14/23 0332 10/15/23 0338  NA 129* 134* 134* 135 136 136  K 4.0 3.5 3.1* 3.3* 3.0* 3.4*  CL 97* 103 104 103 106 106  CO2 21* 22 22 22 23  21*  GLUCOSE 102* 88 135* 112* 103* 94  BUN 106* 82* 57* 52* 48* 48*  CREATININE 3.91* 2.65* 1.97* 1.81* 1.47* 1.63*  CALCIUM 8.2* 8.4* 8.7* 8.7* 8.7* 8.8*  MG  --   --   --  1.7 2.0 1.9  PHOS 5.3* 3.5 2.6 2.6 3.0  --    GFR: Estimated Creatinine Clearance: 50.7 mL/min (A) (by C-G formula based on SCr of 1.63 mg/dL (H)). Liver Function Tests: Recent  Labs  Lab 10/10/23 0322 10/11/23 0424 10/12/23 1136 10/13/23 0220 10/14/23 0332  ALBUMIN 2.7* 2.7* 2.8* 3.2* 2.7*   No results for input(s): "LIPASE", "AMYLASE" in the last 168 hours. No results for input(s): "AMMONIA" in the last 168 hours. Coagulation Profile: No results for input(s): "INR", "PROTIME" in the last 168 hours. Cardiac Enzymes: No results for input(s): "CKTOTAL", "CKMB", "CKMBINDEX", "TROPONINI" in the last 168 hours. BNP (last 3 results) No results for input(s): "PROBNP" in the last 8760 hours. HbA1C: No results for input(s): "HGBA1C" in the last 72 hours. CBG: No results for input(s): "GLUCAP" in the last 168 hours. Lipid Profile: No results for input(s): "CHOL", "HDL", "LDLCALC", "TRIG", "CHOLHDL", "LDLDIRECT" in the last 72 hours. Thyroid Function Tests: No results for input(s): "TSH", "T4TOTAL", "FREET4", "T3FREE", "THYROIDAB" in the last 72 hours. Anemia Panel: No results for input(s): "VITAMINB12", "FOLATE", "FERRITIN", "TIBC", "IRON", "RETICCTPCT" in the last 72 hours. Sepsis Labs: No results for input(s): "PROCALCITON", "LATICACIDVEN" in the last 168 hours.  Recent Results (from the past 240 hours)  Urine Culture (for pregnant, neutropenic or urologic patients or patients with an indwelling urinary catheter)     Status: Abnormal   Collection Time: 10/06/23  8:43 AM   Specimen: Urine, Clean Catch  Result Value Ref Range Status   Specimen Description   Final    URINE, CLEAN CATCH Performed at [facility], [address], [place], [place] [address]    Special Requests   Final    NONE Performed at [facility], [address], [place], [place] [address]    Culture 80,000 COLONIES/mL PSEUDOMONAS AERUGINOSA (A)  Final   Report Status 10/08/2023 FINAL  Final   Organism ID, Bacteria PSEUDOMONAS AERUGINOSA (A)  Final      Susceptibility   Pseudomonas aeruginosa - MIC*    CEFTAZIDIME 4 SENSITIVE Sensitive      CIPROFLOXACIN <=0.25 SENSITIVE Sensitive     GENTAMICIN <=1 SENSITIVE Sensitive     IMIPENEM 1 SENSITIVE Sensitive     PIP/TAZO <=4 SENSITIVE Sensitive ug/mL    CEFEPIME 2 SENSITIVE Sensitive     * 80,000 COLONIES/mL PSEUDOMONAS AERUGINOSA    Radiology Studies: No results found.  Scheduled Meds:  atorvastatin  10 mg Oral QHS   Chlorhexidine Gluconate Cloth  6 each Topical Daily   docusate sodium  100 mg Oral BID   feeding supplement  237 mL Oral BID BM  liver oil-zinc oxide   Topical BID   multivitamin with minerals  1 tablet Oral Daily   oxybutynin  10 mg Oral QHS   pantoprazole  40 mg Oral Daily   predniSONE  40 mg Oral Q breakfast   senna  1 tablet Oral BID   sodium bicarbonate  650 mg Oral BID   sodium chloride flush  3 mL Intravenous Q12H   Continuous Infusions:   LOS: 17 days    Time spent: 50 mins    Willeen Niece, MD [facility]   If 7PM-7AM, please contact night-coverage

## 2023-10-15 NOTE — Plan of Care (Signed)
  Problem: Education: Goal: Knowledge of General Education information will improve Description: Including pain rating scale, medication(s)/side effects and non-pharmacologic comfort measures Outcome: Progressing   Problem: Health Behavior/Discharge Planning: Goal: Ability to manage health-related needs will improve Outcome: Progressing   Problem: Clinical Measurements: Goal: Will remain free from infection Outcome: Progressing Goal: Diagnostic test results will improve Outcome: Progressing Goal: Respiratory complications will improve Outcome: Progressing Goal: Cardiovascular complication will be avoided Outcome: Progressing   Problem: Activity: Goal: Risk for activity intolerance will decrease Outcome: Progressing   Problem: Nutrition: Goal: Adequate nutrition will be maintained Outcome: Progressing   Problem: Coping: Goal: Level of anxiety will decrease Outcome: Progressing   Problem: Elimination: Goal: Will not experience complications related to bowel motility Outcome: Progressing Goal: Will not experience complications related to urinary retention Outcome: Progressing   Problem: Pain Management: Goal: General experience of comfort will improve Outcome: Progressing   Problem: Safety: Goal: Ability to remain free from injury will improve Outcome: Progressing   Problem: Skin Integrity: Goal: Risk for impaired skin integrity will decrease Outcome: Progressing

## 2023-10-15 NOTE — Plan of Care (Signed)

## 2023-10-15 NOTE — Telephone Encounter (Signed)
12/16 @ 3:50 pm Received call from patient's wife Jerry Tran due to patient is currently in the hospital and need orders place in by Dr. Alvester Morin for PSA bloodwork.  Called [facility] - Dr. Shannan Harper office then transfer call as requested - front office representative.

## 2023-10-16 DIAGNOSIS — N179 Acute kidney failure, unspecified: Secondary | ICD-10-CM | POA: Diagnosis not present

## 2023-10-16 LAB — CBC
HCT: 27.6 % — ABNORMAL LOW (ref 39.0–52.0)
Hemoglobin: 8.9 g/dL — ABNORMAL LOW (ref 13.0–17.0)
MCH: 28.9 pg (ref 26.0–34.0)
MCHC: 32.2 g/dL (ref 30.0–36.0)
MCV: 89.6 fL (ref 80.0–100.0)
Platelets: 69 10*3/uL — ABNORMAL LOW (ref 150–400)
RBC: 3.08 MIL/uL — ABNORMAL LOW (ref 4.22–5.81)
RDW: 17 % — ABNORMAL HIGH (ref 11.5–15.5)
WBC: 8.1 10*3/uL (ref 4.0–10.5)
nRBC: 0 % (ref 0.0–0.2)

## 2023-10-16 LAB — BASIC METABOLIC PANEL
Anion gap: 9 (ref 5–15)
BUN: 52 mg/dL — ABNORMAL HIGH (ref 8–23)
CO2: 22 mmol/L (ref 22–32)
Calcium: 8.9 mg/dL (ref 8.9–10.3)
Chloride: 105 mmol/L (ref 98–111)
Creatinine, Ser: 1.9 mg/dL — ABNORMAL HIGH (ref 0.61–1.24)
GFR, Estimated: 36 mL/min — ABNORMAL LOW (ref >60)
Glucose, Bld: 97 mg/dL (ref 70–99)
Potassium: 4.1 mmol/L (ref 3.5–5.1)
Sodium: 136 mmol/L (ref 135–145)

## 2023-10-16 LAB — PHOSPHORUS: Phosphorus: 2.9 mg/dL (ref 2.5–4.6)

## 2023-10-16 LAB — MAGNESIUM: Magnesium: 1.9 mg/dL (ref 1.7–2.4)

## 2023-10-16 NOTE — Progress Notes (Signed)
PROGRESS NOTE    Jerry Tran  ZOX:[id number] DOB: [birth year] DOA: 09/28/2023 PCP: [name]   Brief Narrative:  This 77 y.o. male with medical history significant of mantle cell lymphoma, CKD IIIA, diastolic CHF, A-fib, HLD presented with B/L leg swelling and groin swelling for the past 2 weeks and worsening shortness of breath. On presentation, BUN/creatinine were more than 120 and 3.0. CT of abdomen showed progressive adenopathy along with new bilateral hydroureteronephrosis secondary to compressive effect lower bladder thickening and anasarca. Oncology and urology were consulted. He underwent ureteric stent placement for extrinsic compression on 10/05/2023. On 10/06/2023, he was found to be more tachypneic: VQ scan was suggestive of 3 moderate-sized subsegmental perfusion defects compatible with PE. He was started on heparin drip. He subsequently had hematuria requiring CBI. On 10/09/2023, hemoglobin was found to be 4.1: He was transfused 3 units packed red cells. Heparin drip was stopped. Palliative care also consulted.    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   Hemoglobin was found to be 4.1 on 10/19/2023: Status post 4 units packed red cells transfusion.   Hemoglobin 8.9 this morning.  Monitor H&H daily. Patient reports having intermittent hematuria. CT A/P without contrast on 10/09/2023 suggested  possible intramuscular hemorrhage in the psoas muscles with bilateral ureteral stents with mild bilateral hydronephrosis. Heparin remains on hold.  Will follow-up with oncology regarding plan for anticoagulation.   At this time, patient/family want to stay off of anticoagulation.   Acute hypoxic respiratory failure: Possible PE with bilateral acute lower extremity DVT. VQ scan : PE+,   Heparin plan as above. Currently on room air   AKI on CKD stage IIIa: Acute metabolic acidosis > Improved. Due to combination of obstructive uropathy and ischemic ATN from relative hypotension Creatinine improving, Nephrology signed off on 10/13/2023 and recommended to start Lasix 40 mg p.o. twice a day.  BP currently on the lower side.  Will start Lasix if blood pressure improves.   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 Status post ureteric stent placement on 10/05/2023.  Urology following. Still having hematuria.   Urine culture grew 80,000 colonies per mL of Pseudomonas aeruginosa from 10/06/2023.   Currently on oral Cipro: completed 7 days.   Hyponatremia: Improved.   Hypokalemia: Replaced.  Continue to monitor.   Thrombocytopenia Due to mantle cell lymphoma.  Platelets improving to 69 today.  Monitor   Extensive retroperitoneal and pelvic lymphadenopathy Lymphedema Mantle cell lymphoma Goals of care Likely due to significant involvement of abdominal and pelvic lymph nodes by his mantle cell lymphoma Oncology following.  Patient is apparently not a candidate for further treatment at this time.  Currently on prednisone.  Oncology recommended hospice.   Palliative care following.  Patient wants to remain full code and wants to continue current scope of treatment.   DVT prophylaxis: SCDs Code Status: Full code Family Communication: No  family at bed side. Disposition Plan:  Status is: Inpatient Remains inpatient appropriate  because: Due to severity of illness.   Consultants:  Cardiology Urology Nephrology Palliative care  Procedures:  None Antimicrobials:  Anti-infectives (From admission, onward)    Start     Dose/Rate Route Frequency Ordered Stop   10/13/23 0800  ciprofloxacin (CIPRO) tablet 500 mg  Status:  Discontinued        500 mg Oral Daily with breakfast 10/12/23 1506 10/14/23 0929   10/12/23 2000  ciprofloxacin (CIPRO) tablet 500 mg  Status:  Discontinued        500 mg Oral 2 times daily 10/12/23 1505 10/12/23 1506   10/10/23 1430  ciprofloxacin (CIPRO) tablet 500 mg  Status:  Discontinued        500 mg Oral Daily 10/10/23 1332 10/12/23 1505   10/09/23 0800  ciprofloxacin (CIPRO) tablet 500 mg  Status:  Discontinued        500 mg Oral Daily with breakfast 10/08/23 1035 10/08/23 1049   10/08/23 1145  ciprofloxacin (CIPRO) tablet 500 mg        500 mg Oral Daily with breakfast 10/08/23 1049 10/11/23 0759   10/07/23 1800  amoxicillin-clavulanate (AUGMENTIN) 500-125 MG per tablet 1 tablet  Status:  Discontinued        1 tablet Oral Every 12 hours 10/07/23 1333 10/08/23 1035   10/06/23 1100  Ampicillin-Sulbactam (UNASYN) 3 g in sodium chloride 0.9 % 100 mL IVPB  Status:  Discontinued        3 g 200 mL/hr over 30 Minutes Intravenous Every 12 hours 10/06/23 0948 10/07/23 1333   10/05/23 1038  ceFAZolin (ANCEF) IVPB 2g/100 mL premix        2 g 200 mL/hr over 30 Minutes Intravenous 30 min pre-op 10/05/23 1038 10/05/23 1549      Subjective: Patient was seen and examined at bedside.  Overnight events noted. Patient still has hematuria but it is improving. Heparin is on hold.  Patient participated in physical therapy sessions,  He appeared short of breath after finishing PT session.  Objective: Vitals:   10/15/23 2207 10/16/23 0437 10/16/23 0441 10/16/23 0700  BP: 102/79 109/71    Pulse: 72 80    Resp: 19 19    Temp: 98.9 F (37.2 C) (!) 97.4 F (36.3 C)    TempSrc: Oral Oral    SpO2: 99%  100%  99%  Weight:   105.9 kg   Height:        Intake/Output Summary (Last 24 hours) at 10/16/2023 1308 Last data filed at 10/16/2023 0940 Gross per 24 hour  Intake --  Output 1200 ml  Net -1200 ml   Filed Weights   10/14/23 0500 10/15/23 0500 10/16/23 0441  Weight: 103.5 kg 105.7 kg 105.9 kg    Examination:  General exam: Appears calm and comfortable , deconditioned, not in any acute distress. Respiratory system: CTA bilaterally.  Respiratory effort normal. RR 14 Cardiovascular system: S1 & S2 heard, RRR. No JVD, murmurs, rubs, gallops or clicks.  Gastrointestinal system: Abdomen is non distended, soft and non tender. Normal bowel sounds heard. Central nervous system: Alert and oriented X 3. No focal neurological deficits. Extremities:  Edema++, No cyanosis, No clubbing Skin: No rashes, lesions or ulcers Psychiatry: Judgement and insight appear normal. Mood & affect appropriate.     Data Reviewed: I have personally reviewed following labs and imaging studies  CBC: Recent Labs  Lab 10/12/23 0445 10/13/23 0220 10/14/23 0332 10/15/23 8469  10/16/23 0329  WBC 5.2 8.0 8.3 8.5 8.1  HGB 8.2* 9.1* 8.5* 8.9* 8.9*  HCT 24.4* 28.6* 26.0* 27.5* 27.6*  MCV 87.5 89.1 89.3 90.5 89.6  PLT 59* 72* 73* 74* 69*   Basic Metabolic Panel: Recent Labs  Lab 10/11/23 0424 10/12/23 1136 10/13/23 0220 10/14/23 0332 10/15/23 0338 10/16/23 0329  NA 134* 134* 135 136 136 136  K 3.5 3.1* 3.3* 3.0* 3.4* 4.1  CL 103 104 103 106 106 105  CO2 22 22 22 23  21* 22  GLUCOSE 88 135* 112* 103* 94 97  BUN 82* 57* 52* 48* 48* 52*  CREATININE 2.65* 1.97* 1.81* 1.47* 1.63* 1.90*  CALCIUM 8.4* 8.7* 8.7* 8.7* 8.8* 8.9  MG  --   --  1.7 2.0 1.9 1.9  PHOS 3.5 2.6 2.6 3.0  --  2.9   GFR: Estimated Creatinine Clearance: 43.5 mL/min (A) (by C-G formula based on SCr of 1.9 mg/dL (H)). Liver Function Tests: Recent Labs  Lab 10/10/23 0322 10/11/23 0424 10/12/23 1136 10/13/23 0220 10/14/23 0332   ALBUMIN 2.7* 2.7* 2.8* 3.2* 2.7*   No results for input(s): "LIPASE", "AMYLASE" in the last 168 hours. No results for input(s): "AMMONIA" in the last 168 hours. Coagulation Profile: No results for input(s): "INR", "PROTIME" in the last 168 hours. Cardiac Enzymes: No results for input(s): "CKTOTAL", "CKMB", "CKMBINDEX", "TROPONINI" in the last 168 hours. BNP (last 3 results) No results for input(s): "PROBNP" in the last 8760 hours. HbA1C: No results for input(s): "HGBA1C" in the last 72 hours. CBG: No results for input(s): "GLUCAP" in the last 168 hours. Lipid Profile: No results for input(s): "CHOL", "HDL", "LDLCALC", "TRIG", "CHOLHDL", "LDLDIRECT" in the last 72 hours. Thyroid Function Tests: No results for input(s): "TSH", "T4TOTAL", "FREET4", "T3FREE", "THYROIDAB" in the last 72 hours. Anemia Panel: No results for input(s): "VITAMINB12", "FOLATE", "FERRITIN", "TIBC", "IRON", "RETICCTPCT" in the last 72 hours. Sepsis Labs: No results for input(s): "PROCALCITON", "LATICACIDVEN" in the last 168 hours.  No results found for this or any previous visit (from the past 240 hours).   Radiology Studies: No results found.  Scheduled Meds:  atorvastatin  10 mg Oral QHS   Chlorhexidine Gluconate Cloth  6 each Topical Daily   docusate sodium  100 mg Oral BID   feeding supplement  237 mL Oral BID BM   liver oil-zinc oxide   Topical BID   multivitamin with minerals  1 tablet Oral Daily   oxybutynin  10 mg Oral QHS   pantoprazole  40 mg Oral Daily   predniSONE  40 mg Oral Q breakfast   senna  1 tablet Oral BID   sodium bicarbonate  650 mg Oral BID   sodium chloride flush  3 mL Intravenous Q12H   Continuous Infusions:   LOS: 18 days    Time spent: 35 mins    Willeen Niece, MD [facility]   If 7PM-7AM, please contact night-coverage

## 2023-10-16 NOTE — Plan of Care (Signed)
Shift summary: Foley removed this morning. Pt voided within 6 hours of removal. Still w/ hematuria. Performed personal hygiene by self in the bathroom. Problem: Education: Goal: Knowledge of General Education information will improve Description: Including pain rating scale, medication(s)/side effects and non-pharmacologic comfort measures Outcome: Progressing   Problem: Health Behavior/Discharge Planning: Goal: Ability to manage health-related needs will improve Outcome: Progressing   Problem: Clinical Measurements: Goal: Ability to maintain clinical measurements within normal limits will improve Outcome: Progressing Goal: Will remain free from infection Outcome: Progressing Goal: Diagnostic test results will improve Outcome: Progressing Goal: Respiratory complications will improve Outcome: Progressing Goal: Cardiovascular complication will be avoided Outcome: Progressing   Problem: Activity: Goal: Risk for activity intolerance will decrease Outcome: Progressing   Problem: Nutrition: Goal: Adequate nutrition will be maintained Outcome: Progressing   Problem: Coping: Goal: Level of anxiety will decrease Outcome: Progressing   Problem: Elimination: Goal: Will not experience complications related to bowel motility Outcome: Progressing Goal: Will not experience complications related to urinary retention Outcome: Progressing   Problem: Pain Management: Goal: General experience of comfort will improve Outcome: Progressing   Problem: Safety: Goal: Ability to remain free from injury will improve Outcome: Progressing   Problem: Skin Integrity: Goal: Risk for impaired skin integrity will decrease Outcome: Progressing

## 2023-10-16 NOTE — Progress Notes (Addendum)
   11 Days Post-Op Subjective: Discussed case and plan with he and his wife. NAEON.   Objective: Vital signs in last 24 hours: Temp:  [97.4 F (36.3 C)-98.9 F (37.2 C)] 97.4 F (36.3 C) (12/17 0437) Pulse Rate:  [72-80] 80 (12/17 0437) Resp:  [19] 19 (12/17 0437) BP: (102-109)/(71-82) 109/71 (12/17 0437) SpO2:  [96 %-100 %] 99 % (12/17 0700) Weight:  [105.9 kg] 105.9 kg (12/17 0441)  Assessment/Plan: # Bilateral hydronephrosis  # Penoscrotal edema  2/2 extrinsic lymphatic compression  S/p bilateral ureteral stent placement with Dr. Alvester Morin on 12/6 Diffuse anasarca.  Recommend keeping scrotum/penis elevated.    #Hematuria #ABLA #Pulmonary embolism #AoCKD  3 filling defects present on VQ scan, consistent with PE. As these appear to be subsegmental.   HgB remains stable    Slight return of hematuria. Possibly from the hematuria catheter itself. Possibly from stents.  TOV on hold until creatinine stabilizes. Will remove foley prior to discharge to hospice.  Continue to trend labs. Scr continues to rise.    Intake/Output from previous day: 12/16 0701 - 12/17 0700 In: 240 [P.O.:240] Out: 1050 [Urine:1050]  Intake/Output this shift: Total I/O In: -  Out: 150 [Urine:150]  Physical Exam:  General: Sleeping CV: No cyanosis Lungs: equal chest rise Gu: 69f three-way hematuria catheter in place.  CBI off. Light pink urine  Lab Results: Recent Labs    10/14/23 0332 10/15/23 0338 10/16/23 0329  HGB 8.5* 8.9* 8.9*  HCT 26.0* 27.5* 27.6*   BMET Recent Labs    10/15/23 0338 10/16/23 0329  NA 136 136  K 3.4* 4.1  CL 106 105  CO2 21* 22  GLUCOSE 94 97  BUN 48* 52*  CREATININE 1.63* 1.90*  CALCIUM 8.8* 8.9     Studies/Results: No results found.     LOS: 18 days   Elmon Kirschner, NP [facility] Pager: [phone]  10/16/2023, 10:03 AM

## 2023-10-17 DIAGNOSIS — N179 Acute kidney failure, unspecified: Secondary | ICD-10-CM | POA: Diagnosis not present

## 2023-10-17 LAB — BASIC METABOLIC PANEL
Anion gap: 10 (ref 5–15)
BUN: 62 mg/dL — ABNORMAL HIGH (ref 8–23)
CO2: 21 mmol/L — ABNORMAL LOW (ref 22–32)
Calcium: 9 mg/dL (ref 8.9–10.3)
Chloride: 102 mmol/L (ref 98–111)
Creatinine, Ser: 2.49 mg/dL — ABNORMAL HIGH (ref 0.61–1.24)
GFR, Estimated: 26 mL/min — ABNORMAL LOW (ref >60)
Glucose, Bld: 101 mg/dL — ABNORMAL HIGH (ref 70–99)
Potassium: 4.3 mmol/L (ref 3.5–5.1)
Sodium: 133 mmol/L — ABNORMAL LOW (ref 135–145)

## 2023-10-17 LAB — GLUCOSE, CAPILLARY: Glucose-Capillary: 110 mg/dL — ABNORMAL HIGH (ref 70–99)

## 2023-10-17 MED ORDER — CYCLOBENZAPRINE HCL 5 MG PO TABS
5.0000 mg | ORAL_TABLET | Freq: Three times a day (TID) | ORAL | Status: DC | PRN
  Administered 2023-10-17 – 2023-10-21 (×4): 5 mg via ORAL
  Filled 2023-10-17 (×4): qty 1

## 2023-10-17 NOTE — Progress Notes (Signed)
PT Cancellation Note  Patient Details Name: Jerry Tran MRN: [id number] DOB: 11-27-1945   Cancelled Treatment:    Reason Eval/Treat Not Completed: Patient declined, no reason specified    Faye Ramsay, PT Acute Rehabilitation  Office: [phone]

## 2023-10-17 NOTE — TOC Progression Note (Signed)
Transition of Care [facility]) - Progression Note    Patient Details  Name: Jerry Tran MRN: [id number] Date of Birth: Jun 09, 1946  [facility] of [facility] CM/SW Contact  Larrie Kass, LCSW Phone Number: 10/17/2023, 11:54 AM  Clinical Narrative:     Pt's PASRR number is still pending. Pt will need number for SNF placement. TOC to follow.   Expected Discharge Plan: Skilled Nursing Facility Barriers to Discharge: Continued Medical Work up  Expected Discharge Plan and Services       Living arrangements for the past 2 months: Apartment                                       Social Determinants of Health (SDOH) Interventions SDOH Screenings   Food Insecurity: No Food Insecurity (09/29/2023)  Housing: Low Risk  (09/29/2023)  Transportation Needs: No Transportation Needs (09/29/2023)  Utilities: Not At Risk (09/29/2023)  Alcohol Screen: Low Risk  (08/08/2023)  Depression (PHQ2-9): Low Risk  (04/03/2023)  Tobacco Use: Medium Risk (10/05/2023)  Health Literacy: Adequate Health Literacy (08/08/2023)    Readmission Risk Interventions     No data to display

## 2023-10-17 NOTE — Progress Notes (Signed)
PROGRESS NOTE    Jerry Tran  MWN:[id number] DOB: April 26, 1946 DOA: 09/28/2023 PCP: [name]   Brief Narrative:  This 77 y.o. male with medical history significant of mantle cell lymphoma, CKD IIIA, diastolic CHF, A-fib, HLD presented with B/L leg swelling and groin swelling for the past 2 weeks and worsening shortness of breath. On presentation, BUN/creatinine were more than 120 and 3.0. CT of abdomen showed progressive adenopathy along with new bilateral hydroureteronephrosis secondary to compressive effect lower bladder thickening and anasarca. Oncology and urology were consulted. He underwent ureteric stent placement for extrinsic compression on 10/05/2023. On 10/06/2023, he was found to be more tachypneic: VQ scan was suggestive of 3 moderate-sized subsegmental perfusion defects compatible with PE. He was started on heparin drip. He subsequently had hematuria requiring CBI. On 10/09/2023, hemoglobin was found to be 4.1: He was transfused 3 units packed red cells. Heparin drip was stopped. Palliative care also consulted.    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   Hemoglobin was found to be 4.1 on 10/19/2023: Status post 4 units packed red cells transfusion.   Hemoglobin 8.9 this morning.  Monitor H&H daily. Patient reports having intermittent hematuria. CT A/P without contrast on 10/09/2023 suggested  possible intramuscular hemorrhage in the psoas muscles with bilateral ureteral stents with mild bilateral hydronephrosis. Heparin remains on hold.  Will follow-up with oncology regarding plan for anticoagulation.   At this time, patient/family want to stay off of anticoagulation.   Acute hypoxic respiratory failure: Improving Possible PE with bilateral acute lower extremity DVT. VQ scan : PE+,   Heparin plan as above. Currently on room air   AKI on CKD stage IIIa: Acute metabolic acidosis > Improved. Due to combination of obstructive uropathy and ischemic ATN from relative hypotension. Creatinine was improving, Nephrology signed off on 10/13/2023 and recommended to start Lasix 40 mg p.o. twice a day.  BP currently on the lower side.  Will start Lasix if blood pressure improves.   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 Status post ureteric stent placement on 10/05/2023.  Urology following. Still having hematuria.   Urine culture grew 80,000 colonies per mL of Pseudomonas aeruginosa from 10/06/2023.   Currently on oral Cipro: completed 7 days.   Hyponatremia: Improved.   Hypokalemia: Replaced.  Continue to monitor.   Thrombocytopenia Due to mantle cell lymphoma.  Platelets improving to 69 today.  Monitor   Extensive retroperitoneal and pelvic lymphadenopathy Lymphedema Mantle cell lymphoma Goals of care Likely due to significant involvement of abdominal and pelvic lymph nodes by his mantle cell lymphoma. Oncology following.  Patient is apparently not a candidate for further treatment at this time.  Currently on prednisone.  Oncology recommended hospice.   Palliative care following.  Patient wants to remain full code and wants to continue current scope of treatment.   DVT prophylaxis: SCDs Code Status: Full code Family  Communication: No family at bed side. Disposition Plan:  Status is: Inpatient Remains  inpatient appropriate because: Due to severity of illness.   Consultants:  Cardiology Urology Nephrology Palliative care  Procedures:  None Antimicrobials:  Anti-infectives (From admission, onward)    Start     Dose/Rate Route Frequency Ordered Stop   10/13/23 0800  ciprofloxacin (CIPRO) tablet 500 mg  Status:  Discontinued        500 mg Oral Daily with breakfast 10/12/23 1506 10/14/23 0929   10/12/23 2000  ciprofloxacin (CIPRO) tablet 500 mg  Status:  Discontinued        500 mg Oral 2 times daily 10/12/23 1505 10/12/23 1506   10/10/23 1430  ciprofloxacin (CIPRO) tablet 500 mg  Status:  Discontinued        500 mg Oral Daily 10/10/23 1332 10/12/23 1505   10/09/23 0800  ciprofloxacin (CIPRO) tablet 500 mg  Status:  Discontinued        500 mg Oral Daily with breakfast 10/08/23 1035 10/08/23 1049   10/08/23 1145  ciprofloxacin (CIPRO) tablet 500 mg        500 mg Oral Daily with breakfast 10/08/23 1049 10/11/23 0759   10/07/23 1800  amoxicillin-clavulanate (AUGMENTIN) 500-125 MG per tablet 1 tablet  Status:  Discontinued        1 tablet Oral Every 12 hours 10/07/23 1333 10/08/23 1035   10/06/23 1100  Ampicillin-Sulbactam (UNASYN) 3 g in sodium chloride 0.9 % 100 mL IVPB  Status:  Discontinued        3 g 200 mL/hr over 30 Minutes Intravenous Every 12 hours 10/06/23 0948 10/07/23 1333   10/05/23 1038  ceFAZolin (ANCEF) IVPB 2g/100 mL premix        2 g 200 mL/hr over 30 Minutes Intravenous 30 min pre-op 10/05/23 1038 10/05/23 1549      Subjective: Patient was seen and examined at bedside.  Overnight events noted. Patient still has hematuria and slowly improving. Heparin is on hold.   Patient participated in physical therapy sessions,  He appeared short of breath after finishing PT session.  Objective: Vitals:   10/16/23 1749 10/16/23 2122 10/17/23 0507 10/17/23 1142  BP: 121/77 118/65 113/63 107/70  Pulse: 93 81 86 83  Resp: 18 18 18 17   Temp: (!) 97.4 F (36.3 C) 97.7 F (36.5  C) 97.6 F (36.4 C) 98.7 F (37.1 C)  TempSrc: Oral Oral Oral Oral  SpO2: 100% 100% 100% 100%  Weight:      Height:        Intake/Output Summary (Last 24 hours) at 10/17/2023 1157 Last data filed at 10/16/2023 2100 Gross per 24 hour  Intake 960 ml  Output 75 ml  Net 885 ml   Filed Weights   10/14/23 0500 10/15/23 0500 10/16/23 0441  Weight: 103.5 kg 105.7 kg 105.9 kg    Examination:  General exam: Appears comfortable, deconditioned, not in any acute distress. Respiratory system: CTA bilaterally.  Respiratory effort normal. RR 14 Cardiovascular system: S1 & S2 heard, RRR. No JVD, murmurs, rubs, gallops or clicks.  Gastrointestinal system: Abdomen is non distended, soft and non tender. Normal bowel sounds heard. Central nervous system: Alert and oriented X 3. No focal neurological deficits. Extremities:  Edema++, No cyanosis, No clubbing Skin: No rashes, lesions or ulcers Psychiatry: Judgement and insight appear normal. Mood & affect appropriate.     Data Reviewed: I have personally reviewed following labs and imaging studies  CBC: Recent Labs  Lab 10/12/23 0445 10/13/23  1191 10/14/23 0332 10/15/23 0338 10/16/23 0329  WBC 5.2 8.0 8.3 8.5 8.1  HGB 8.2* 9.1* 8.5* 8.9* 8.9*  HCT 24.4* 28.6* 26.0* 27.5* 27.6*  MCV 87.5 89.1 89.3 90.5 89.6  PLT 59* 72* 73* 74* 69*   Basic Metabolic Panel: Recent Labs  Lab 10/11/23 0424 10/12/23 1136 10/13/23 0220 10/14/23 0332 10/15/23 0338 10/16/23 0329 10/17/23 0312  NA 134* 134* 135 136 136 136 133*  K 3.5 3.1* 3.3* 3.0* 3.4* 4.1 4.3  CL 103 104 103 106 106 105 102  CO2 22 22 22 23  21* 22 21*  GLUCOSE 88 135* 112* 103* 94 97 101*  BUN 82* 57* 52* 48* 48* 52* 62*  CREATININE 2.65* 1.97* 1.81* 1.47* 1.63* 1.90* 2.49*  CALCIUM 8.4* 8.7* 8.7* 8.7* 8.8* 8.9 9.0  MG  --   --  1.7 2.0 1.9 1.9  --   PHOS 3.5 2.6 2.6 3.0  --  2.9  --    GFR: Estimated Creatinine Clearance: 33.2 mL/min (A) (by C-G formula based on SCr of  2.49 mg/dL (H)). Liver Function Tests: Recent Labs  Lab 10/11/23 0424 10/12/23 1136 10/13/23 0220 10/14/23 0332  ALBUMIN 2.7* 2.8* 3.2* 2.7*   No results for input(s): "LIPASE", "AMYLASE" in the last 168 hours. No results for input(s): "AMMONIA" in the last 168 hours. Coagulation Profile: No results for input(s): "INR", "PROTIME" in the last 168 hours. Cardiac Enzymes: No results for input(s): "CKTOTAL", "CKMB", "CKMBINDEX", "TROPONINI" in the last 168 hours. BNP (last 3 results) No results for input(s): "PROBNP" in the last 8760 hours. HbA1C: No results for input(s): "HGBA1C" in the last 72 hours. CBG: Recent Labs  Lab 10/17/23 0009  GLUCAP 110*   Lipid Profile: No results for input(s): "CHOL", "HDL", "LDLCALC", "TRIG", "CHOLHDL", "LDLDIRECT" in the last 72 hours. Thyroid Function Tests: No results for input(s): "TSH", "T4TOTAL", "FREET4", "T3FREE", "THYROIDAB" in the last 72 hours. Anemia Panel: No results for input(s): "VITAMINB12", "FOLATE", "FERRITIN", "TIBC", "IRON", "RETICCTPCT" in the last 72 hours. Sepsis Labs: No results for input(s): "PROCALCITON", "LATICACIDVEN" in the last 168 hours.  No results found for this or any previous visit (from the past 240 hours).   Radiology Studies: No results found.  Scheduled Meds:  atorvastatin  10 mg Oral QHS   Chlorhexidine Gluconate Cloth  6 each Topical Daily   docusate sodium  100 mg Oral BID   feeding supplement  237 mL Oral BID BM   liver oil-zinc oxide   Topical BID   multivitamin with minerals  1 tablet Oral Daily   oxybutynin  10 mg Oral QHS   pantoprazole  40 mg Oral Daily   predniSONE  40 mg Oral Q breakfast   senna  1 tablet Oral BID   sodium bicarbonate  650 mg Oral BID   sodium chloride flush  3 mL Intravenous Q12H   Continuous Infusions:   LOS: 19 days    Time spent: 35 mins    Willeen Niece, MD [facility]   If 7PM-7AM, please contact night-coverage

## 2023-10-17 NOTE — Plan of Care (Signed)
  Problem: Health Behavior/Discharge Planning: Goal: Ability to manage health-related needs will improve Outcome: Progressing   Problem: Clinical Measurements: Goal: Respiratory complications will improve Outcome: Progressing   Problem: Safety: Goal: Ability to remain free from injury will improve Outcome: Progressing   Problem: Coping: Goal: Level of anxiety will decrease Outcome: Not Progressing

## 2023-10-18 ENCOUNTER — Inpatient Hospital Stay (HOSPITAL_COMMUNITY): Payer: No Typology Code available for payment source

## 2023-10-18 ENCOUNTER — Other Ambulatory Visit (HOSPITAL_COMMUNITY): Payer: Self-pay

## 2023-10-18 DIAGNOSIS — N179 Acute kidney failure, unspecified: Secondary | ICD-10-CM | POA: Diagnosis not present

## 2023-10-18 LAB — BASIC METABOLIC PANEL
Anion gap: 10 (ref 5–15)
BUN: 76 mg/dL — ABNORMAL HIGH (ref 8–23)
CO2: 21 mmol/L — ABNORMAL LOW (ref 22–32)
Calcium: 9 mg/dL (ref 8.9–10.3)
Chloride: 101 mmol/L (ref 98–111)
Creatinine, Ser: 2.69 mg/dL — ABNORMAL HIGH (ref 0.61–1.24)
GFR, Estimated: 24 mL/min — ABNORMAL LOW (ref >60)
Glucose, Bld: 90 mg/dL (ref 70–99)
Potassium: 4.1 mmol/L (ref 3.5–5.1)
Sodium: 132 mmol/L — ABNORMAL LOW (ref 135–145)

## 2023-10-18 LAB — URINALYSIS, ROUTINE W REFLEX MICROSCOPIC
Bilirubin Urine: NEGATIVE
Glucose, UA: NEGATIVE mg/dL
Ketones, ur: NEGATIVE mg/dL
Nitrite: NEGATIVE
Protein, ur: 100 mg/dL — AB
RBC / HPF: >50 RBC/hpf (ref 0–5)
Specific Gravity, Urine: 1.015 (ref 1.005–1.030)
WBC, UA: >50 WBC/hpf (ref 0–5)
pH: 6 (ref 5.0–8.0)

## 2023-10-18 LAB — CBC
HCT: 27.7 % — ABNORMAL LOW (ref 39.0–52.0)
Hemoglobin: 9 g/dL — ABNORMAL LOW (ref 13.0–17.0)
MCH: 29 pg (ref 26.0–34.0)
MCHC: 32.5 g/dL (ref 30.0–36.0)
MCV: 89.4 fL (ref 80.0–100.0)
Platelets: 72 10*3/uL — ABNORMAL LOW (ref 150–400)
RBC: 3.1 MIL/uL — ABNORMAL LOW (ref 4.22–5.81)
RDW: 17.1 % — ABNORMAL HIGH (ref 11.5–15.5)
WBC: 6.5 10*3/uL (ref 4.0–10.5)
nRBC: 0 % (ref 0.0–0.2)

## 2023-10-18 LAB — SODIUM, URINE, RANDOM: Sodium, Ur: 33 mmol/L

## 2023-10-18 LAB — MAGNESIUM: Magnesium: 2.1 mg/dL (ref 1.7–2.4)

## 2023-10-18 LAB — CREATININE, URINE, RANDOM: Creatinine, Urine: 144 mg/dL

## 2023-10-18 LAB — PHOSPHORUS: Phosphorus: 3.9 mg/dL (ref 2.5–4.6)

## 2023-10-18 NOTE — Progress Notes (Signed)
[facility] Progress Note   Subjective: Pt seen in room. Pt's creatinine worsened the last several days after improving due to bilat ureteral stenting 12/06 for ureteral obstruction from intra-abdominal lymphadenopathy related to lymphoma cancer. Creat peaked at 5.1 on 12/07 then improved slowly down to 1.4 on 12/15. Now is up to 2.4 yest and 2.6 today.   Vitals:   10/17/23 1142 10/17/23 2025 10/18/23 0351 10/18/23 0628  BP: 107/70 121/74 (!) 102/59   Pulse: 83 80 88   Resp: 17 18 19    Temp: 98.7 F (37.1 C) 98.3 F (36.8 C) 97.7 F (36.5 C)   TempSrc: Oral Oral Oral   SpO2: 100% 100% 100%   Weight:    107 kg  Height:        Exam: General:NAD, comfortable Heart:RRR, s1s2 nl Lungs:clear b/l, no crackle Abdomen:soft, mild distention and abdomen wall has edema. Extremities: 2-3+ bilat LE pitting edema of LE's and scrotum Neurology: Alert, awake and following commands.   Assessment/ Plan: Acute kidney injury on CKD stage IIIa likely due to combination of obstructive uropathy and ischemic ATN from relative hypotension.  S/P bilateral ureteral stent placement by urology on 12/06. Creat peaked at 5.12 on 12/07 and then improved down to 1.4 on 12/15. Now creat rising again up to 2-2.5 range. Suspect recurrent hydronephrosis, will get noncontrast CT abd/ pelvis. Other possible cause is hypotension.  Bilateral hydronephrosis: due to extrinsic lymph node compression. SP post bilateral ureteral stent 12/06 per urology. S/P course of CBI per urology as well.  Volume overload: w/ marked scrotal and LE edema; no resp issues, pt remains on room air. Looks like this edema is extravascular vol overload limited to the pelvis and LE's.  Metastatic mantle cell lymphoma: f/b oncology   Vinson Moselle MD  [facility] 10/11/2023, 2:35 PM  Recent Labs  Lab 10/10/23 0322 10/11/23 0424 10/11/23 0425  HGB 6.6*  --  7.8*  ALBUMIN 2.7* 2.7*  --   CALCIUM 8.2* 8.4*  --   PHOS 5.3* 3.5  --    CREATININE 3.91* 2.65*  --   K 4.0 3.5  --    No results for input(s): "IRON", "TIBC", "FERRITIN" in the last 168 hours. Inpatient medications:  atorvastatin  10 mg Oral QHS   Chlorhexidine Gluconate Cloth  6 each Topical Daily   docusate sodium  100 mg Oral BID   feeding supplement  237 mL Oral BID BM   liver oil-zinc oxide   Topical BID   multivitamin with minerals  1 tablet Oral Daily   oxybutynin  10 mg Oral QHS   pantoprazole  40 mg Oral Daily   predniSONE  40 mg Oral Q breakfast   senna  1 tablet Oral BID   sodium bicarbonate  650 mg Oral BID   sodium chloride flush  3 mL Intravenous Q12H     acetaminophen **OR** acetaminophen, bisacodyl, cyclobenzaprine, HYDROcodone-acetaminophen, ondansetron **OR** ondansetron (ZOFRAN) IV, polyethylene glycol, sodium chloride flush, sodium chloride flush, traZODone

## 2023-10-18 NOTE — Progress Notes (Signed)
PT Cancellation Note  Patient Details Name: Jerry Tran MRN: [id number] DOB: 1946-07-01   Cancelled Treatment:    Reason Eval/Treat Not Completed: Fatigue/lethargy limiting ability to participate, reports  had tests today. Plans to ambulate when wife is here later.  Blanchard Kelch PT Acute Rehabilitation Services Office [phone] Weekend [phone]    Rada Hay 10/18/2023, 3:45 PM

## 2023-10-18 NOTE — Plan of Care (Signed)
Shift summary: RN contacted MD d/t concern for worsening kidney fx and pt complaints of back pain. MD advised to contact urology (see note). Nephrology was also re-consulted. CT A/P was done, p results. Still p urine sample as pt does not void very frequently. Plan to triage this pt to med surg this afternoon. Problem: Education: Goal: Knowledge of General Education information will improve Description: Including pain rating scale, medication(s)/side effects and non-pharmacologic comfort measures Outcome: Progressing   Problem: Health Behavior/Discharge Planning: Goal: Ability to manage health-related needs will improve Outcome: Progressing   Problem: Clinical Measurements: Goal: Will remain free from infection Outcome: Progressing Goal: Respiratory complications will improve Outcome: Progressing Goal: Cardiovascular complication will be avoided Outcome: Progressing   Problem: Activity: Goal: Risk for activity intolerance will decrease Outcome: Progressing   Problem: Nutrition: Goal: Adequate nutrition will be maintained Outcome: Progressing   Problem: Elimination: Goal: Will not experience complications related to bowel motility Outcome: Progressing Goal: Will not experience complications related to urinary retention Outcome: Progressing   Problem: Safety: Goal: Ability to remain free from injury will improve Outcome: Progressing   Problem: Skin Integrity: Goal: Risk for impaired skin integrity will decrease Outcome: Progressing   Problem: Clinical Measurements: Goal: Ability to maintain clinical measurements within normal limits will improve Outcome: Not Progressing Goal: Diagnostic test results will improve Outcome: Not Progressing   Problem: Coping: Goal: Level of anxiety will decrease Outcome: Not Progressing   Problem: Pain Management: Goal: General experience of comfort will improve Outcome: Not Progressing

## 2023-10-18 NOTE — Progress Notes (Signed)
PROGRESS NOTE    Jerry Tran  HQI:[id number] DOB: 10-Aug-1946 DOA: 09/28/2023 PCP: [name]   Brief Narrative:  This 77 y.o. male with medical history significant of mantle cell lymphoma, CKD IIIA, diastolic CHF, A-fib, HLD presented with B/L leg swelling and groin swelling for the past 2 weeks and worsening shortness of breath. On presentation, BUN/creatinine were more than 120 and 3.0. CT of abdomen showed progressive adenopathy along with new bilateral hydroureteronephrosis secondary to compressive effect lower bladder thickening and anasarca. Oncology and urology were consulted. He underwent ureteric stent placement for extrinsic compression on 10/05/2023. On 10/06/2023, he was found to be more tachypneic: VQ scan was suggestive of 3 moderate-sized subsegmental perfusion defects compatible with PE. He was started on heparin drip. He subsequently had hematuria requiring CBI. On 10/09/2023, hemoglobin was found to be 4.1: He was transfused 3 units packed red cells. Heparin drip was stopped. Palliative care also consulted.    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   Hemoglobin was found to be 4.1 on 10/19/2023: Status post 4 units packed red cells transfusion.   Hemoglobin 9.0 this morning.  Monitor H&H daily. Patient reports having intermittent hematuria. CT A/P without contrast on 10/09/2023 suggested  possible intramuscular hemorrhage in the psoas muscles with bilateral ureteral stents with mild bilateral hydronephrosis. Heparin remains on hold.  Will follow-up with oncology regarding plan for anticoagulation.   At this time, patient/family want to stay off of anticoagulation.   Acute hypoxic respiratory failure: Improving Possible PE with bilateral acute lower extremity DVT. VQ scan : PE+,   Heparin plan as above. Currently on room air   AKI on CKD stage IIIa: Acute metabolic acidosis > Improved. Due to combination of obstructive uropathy and ischemic ATN from relative hypotension. Creatinine was improving, Nephrology signed off on 10/13/2023 and recommended to start Lasix 40 mg p.o. twice a day.  BP currently on the lower side.  Will start Lasix if blood pressure improves. Renal functions worsening again, nephrology reconsulted.   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 Status post ureteric stent placement on 10/05/2023.  Urology following. Still having hematuria.   Urine culture grew 80,000 colonies per mL of Pseudomonas aeruginosa from 10/06/2023.   Currently on oral Cipro: completed 7 days.   Hyponatremia: Improved.   Hypokalemia: Replaced.  Continue to monitor.   Thrombocytopenia Due to mantle cell lymphoma.  Platelets improving to 69 today.  Monitor   Extensive retroperitoneal and pelvic lymphadenopathy Lymphedema Mantle cell lymphoma Goals of care Likely due to significant involvement of abdominal and pelvic lymph nodes by his mantle cell lymphoma. Oncology following.  Patient is apparently not a candidate for further treatment at this time.  Currently on prednisone.  Oncology recommended hospice.   Palliative care following.  Patient wants to remain full code and wants to continue current scope of treatment.   DVT prophylaxis:  SCDs Code Status: Full code Family Communication:Wife at bed  side Disposition Plan:  Status is: Inpatient Remains inpatient appropriate because: Due to severity of illness.   Consultants:  Cardiology Urology Nephrology Palliative care  Procedures:  None Antimicrobials:  Anti-infectives (From admission, onward)    Start     Dose/Rate Route Frequency Ordered Stop   10/13/23 0800  ciprofloxacin (CIPRO) tablet 500 mg  Status:  Discontinued        500 mg Oral Daily with breakfast 10/12/23 1506 10/14/23 0929   10/12/23 2000  ciprofloxacin (CIPRO) tablet 500 mg  Status:  Discontinued        500 mg Oral 2 times daily 10/12/23 1505 10/12/23 1506   10/10/23 1430  ciprofloxacin (CIPRO) tablet 500 mg  Status:  Discontinued        500 mg Oral Daily 10/10/23 1332 10/12/23 1505   10/09/23 0800  ciprofloxacin (CIPRO) tablet 500 mg  Status:  Discontinued        500 mg Oral Daily with breakfast 10/08/23 1035 10/08/23 1049   10/08/23 1145  ciprofloxacin (CIPRO) tablet 500 mg        500 mg Oral Daily with breakfast 10/08/23 1049 10/11/23 0759   10/07/23 1800  amoxicillin-clavulanate (AUGMENTIN) 500-125 MG per tablet 1 tablet  Status:  Discontinued        1 tablet Oral Every 12 hours 10/07/23 1333 10/08/23 1035   10/06/23 1100  Ampicillin-Sulbactam (UNASYN) 3 g in sodium chloride 0.9 % 100 mL IVPB  Status:  Discontinued        3 g 200 mL/hr over 30 Minutes Intravenous Every 12 hours 10/06/23 0948 10/07/23 1333   10/05/23 1038  ceFAZolin (ANCEF) IVPB 2g/100 mL premix        2 g 200 mL/hr over 30 Minutes Intravenous 30 min pre-op 10/05/23 1038 10/05/23 1549      Subjective: Patient was seen and examined at bedside.  Overnight events noted. Patient still has hematuria and slowly improving. Heparin is on hold.    Objective: Vitals:   10/17/23 1142 10/17/23 2025 10/18/23 0351 10/18/23 0628  BP: 107/70 121/74 (!) 102/59   Pulse: 83 80 88   Resp: 17 18 19    Temp: 98.7 F (37.1 C) 98.3 F (36.8 C) 97.7 F (36.5 C)   TempSrc: Oral Oral Oral   SpO2: 100%  100% 100%   Weight:    107 kg  Height:        Intake/Output Summary (Last 24 hours) at 10/18/2023 1409 Last data filed at 10/18/2023 1300 Gross per 24 hour  Intake 720 ml  Output 250 ml  Net 470 ml   Filed Weights   10/15/23 0500 10/16/23 0441 10/18/23 0628  Weight: 105.7 kg 105.9 kg 107 kg    Examination:  General exam: Appears comfortable, deconditioned, not in any acute distress. Respiratory system: CTA bilaterally.  Respiratory effort normal. RR 14 Cardiovascular system: S1 & S2 heard, RRR. No JVD, murmurs, rubs, gallops or clicks.  Gastrointestinal system: Abdomen is non distended, soft and non tender. Normal bowel sounds heard. Central nervous system: Alert and oriented X 3. No focal neurological deficits. Extremities:  Edema++, No cyanosis, No clubbing Skin: No rashes, lesions or ulcers Psychiatry: Judgement and insight appear normal. Mood & affect appropriate.     Data Reviewed: I have personally reviewed following labs and imaging studies  CBC: Recent Labs  Lab 10/13/23 0220 10/14/23 0332 10/15/23 0338 10/16/23 0329 10/18/23 0400  WBC 8.0 8.3 8.5 8.1 6.5  HGB 9.1* 8.5* 8.9* 8.9* 9.0*  HCT 28.6* 26.0* 27.5* 27.6* 27.7*  MCV 89.1 89.3 90.5 89.6 89.4  PLT 72* 73* 74* 69* 72*   Basic Metabolic Panel: Recent Labs  Lab 10/12/23 1136 10/13/23 0220 10/14/23 0332 10/15/23 0338 10/16/23 0329 10/17/23 0312 10/18/23 0400  NA 134* 135 136 136 136 133* 132*  K 3.1* 3.3* 3.0* 3.4* 4.1 4.3 4.1  CL 104 103 106 106 105 102 101  CO2 22 22 23  21* 22 21* 21*  GLUCOSE 135* 112* 103* 94 97 101* 90  BUN 57* 52* 48* 48* 52* 62* 76*  CREATININE 1.97* 1.81* 1.47* 1.63* 1.90* 2.49* 2.69*  CALCIUM 8.7* 8.7* 8.7* 8.8* 8.9 9.0 9.0  MG  --  1.7 2.0 1.9 1.9  --  2.1  PHOS 2.6 2.6 3.0  --  2.9  --  3.9   GFR: Estimated Creatinine Clearance: 30.9 mL/min (A) (by C-G formula based on SCr of 2.69 mg/dL (H)). Liver Function Tests: Recent Labs  Lab 10/12/23 1136  10/13/23 0220 10/14/23 0332  ALBUMIN 2.8* 3.2* 2.7*   No results for input(s): "LIPASE", "AMYLASE" in the last 168 hours. No results for input(s): "AMMONIA" in the last 168 hours. Coagulation Profile: No results for input(s): "INR", "PROTIME" in the last 168 hours. Cardiac Enzymes: No results for input(s): "CKTOTAL", "CKMB", "CKMBINDEX", "TROPONINI" in the last 168 hours. BNP (last 3 results) No results for input(s): "PROBNP" in the last 8760 hours. HbA1C: No results for input(s): "HGBA1C" in the last 72 hours. CBG: Recent Labs  Lab 10/17/23 0009  GLUCAP 110*   Lipid Profile: No results for input(s): "CHOL", "HDL", "LDLCALC", "TRIG", "CHOLHDL", "LDLDIRECT" in the last 72 hours. Thyroid Function Tests: No results for input(s): "TSH", "T4TOTAL", "FREET4", "T3FREE", "THYROIDAB" in the last 72 hours. Anemia Panel: No results for input(s): "VITAMINB12", "FOLATE", "FERRITIN", "TIBC", "IRON", "RETICCTPCT" in the last 72 hours. Sepsis Labs: No results for input(s): "PROCALCITON", "LATICACIDVEN" in the last 168 hours.  No results found for this or any previous visit (from the past 240 hours).   Radiology Studies: No results found.  Scheduled Meds:  atorvastatin  10 mg Oral QHS   Chlorhexidine Gluconate Cloth  6 each Topical Daily   docusate sodium  100 mg Oral BID   feeding supplement  237 mL Oral BID BM   liver oil-zinc oxide   Topical BID   multivitamin with minerals  1 tablet Oral Daily   oxybutynin  10 mg Oral QHS   pantoprazole  40 mg Oral Daily   predniSONE  40 mg Oral Q breakfast   senna  1 tablet Oral BID   sodium bicarbonate  650 mg Oral BID   sodium chloride flush  3 mL Intravenous Q12H   Continuous Infusions:   LOS: 20 days    Time spent: 35 mins    Willeen Niece, MD [facility]   If 7PM-7AM, please contact night-coverage

## 2023-10-18 NOTE — Consult Note (Signed)
Urology Inpatient Progress Report  Anasarca [R60.1] AKI (acute kidney injury) (HCC) [N17.9] Bilateral lower extremity edema [R60.0]  Procedure(s): CYSTOSCOPY WITH BILATERAL RETROGRADE PYELOGRAM and BILATERAL STENT PLACEMENT  13 Days Post-Op   Intv/Subj: Patient's creatinine has been worsening despite stent placement.  Underwent CT scan today that reveals persistent hydronephrosis bilaterally.  Has some air in the collecting systems probably from the instrumentation rather than infection.  He has been afebrile and is not having any pain or discomfort.  Tells me that his hematuria has improved.  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)   Anemia associated with chemotherapy   Bilateral lower extremity edema   Anasarca   Other pancytopenia (HCC)   Malnutrition of moderate degree   Counseling regarding advance care planning and goals of care  Current Facility-Administered Medications  Medication Dose Route Frequency Provider Last Rate Last Admin   acetaminophen (TYLENOL) tablet 650 mg  650 mg Oral Q6H PRN Doutova, Anastassia, MD       Or   acetaminophen (TYLENOL) suppository 650 mg  650 mg Rectal Q6H PRN Doutova, Anastassia, MD       atorvastatin (LIPITOR) tablet 10 mg  10 mg Oral QHS Doutova, Anastassia, MD   10 mg at 10/17/23 2145   bisacodyl (DULCOLAX) suppository 10 mg  10 mg Rectal Daily PRN Therisa Doyne, MD       Chlorhexidine Gluconate Cloth 2 % PADS 6 each  6 each Topical Daily Kc, Ramesh, MD   6 each at 10/17/23 1625   cyclobenzaprine (FLEXERIL) tablet 5 mg  5 mg Oral TID PRN Willeen Niece, MD   5 mg at 10/18/23 0826   docusate sodium (COLACE) capsule 100 mg  100 mg Oral BID Therisa Doyne, MD   100 mg at 10/15/23 2132   feeding  supplement (ENSURE ENLIVE / ENSURE PLUS) liquid 237 mL  237 mL Oral BID BM Samtani, Jai-Gurmukh, MD   237 mL at 10/13/23 1500   HYDROcodone-acetaminophen (NORCO/VICODIN) 5-325 MG per tablet 1-2 tablet  1-2 tablet Oral Q4H PRN Therisa Doyne, MD   2 tablet at 10/09/23 1006   liver oil-zinc oxide (DESITIN) 40 % ointment   Topical BID Glade Lloyd, MD   Given at 10/18/23 0827   multivitamin with minerals tablet 1 tablet  1 tablet Oral Daily Kc, Ramesh, MD   1 tablet at 10/18/23 0826   ondansetron (ZOFRAN) tablet 4 mg  4 mg Oral Q6H PRN Therisa Doyne, MD       Or   ondansetron (ZOFRAN) injection 4 mg  4 mg Intravenous Q6H PRN Doutova, Anastassia, MD       oxybutynin (DITROPAN-XL) 24 hr tablet 10 mg  10 mg Oral QHS Ray Church III, MD   10 mg at 10/17/23 2145   pantoprazole (PROTONIX) EC tablet 40 mg  40 mg Oral Daily Doutova, Anastassia, MD   40 mg at 10/18/23 0826   polyethylene glycol (MIRALAX / GLYCOLAX) packet 17 g  17 g Oral Daily PRN Therisa Doyne, MD   17 g at 10/08/23 1744   predniSONE (DELTASONE) tablet 40 mg  40 mg Oral Q breakfast Doutova, Anastassia, MD   40 mg at 10/18/23 0826   senna (SENOKOT) tablet 8.6 mg  1 tablet Oral BID Doutova, Anastassia,  MD   8.6 mg at 10/15/23 2132   sodium bicarbonate tablet 650 mg  650 mg Oral BID Rhetta Mura, MD   650 mg at 10/18/23 0826   sodium chloride flush (NS) 0.9 % injection 10-40 mL  10-40 mL Intracatheter PRN Kc, Dayna Barker, MD       sodium chloride flush (NS) 0.9 % injection 3 mL  3 mL Intravenous Q12H Doutova, Anastassia, MD   3 mL at 10/18/23 0827   sodium chloride flush (NS) 0.9 % injection 3 mL  3 mL Intravenous PRN Doutova, Jonny Ruiz, MD       traZODone (DESYREL) tablet 50 mg  50 mg Oral QHS PRN Glade Lloyd, MD   50 mg at 10/15/23 0239     Objective: Vital: Vitals:   10/17/23 2025 10/18/23 0351 10/18/23 0628 10/18/23 1430  BP: 121/74 (!) 102/59  117/66  Pulse: 80 88  69  Resp: 18 19  16   Temp: 98.3 F  (36.8 C) 97.7 F (36.5 C)  98 F (36.7 C)  TempSrc: Oral Oral  Oral  SpO2: 100% 100%  100%  Weight:   107 kg   Height:       I/Os: I/O last 3 completed shifts: In: 720 [P.O.:720] Out: 200 [Urine:200]  Physical Exam:  General: Patient is in no apparent distress Genitourinary: Significant penile edema  Lab Results: Recent Labs    10/16/23 0329 10/18/23 0400  WBC 8.1 6.5  HGB 8.9* 9.0*  HCT 27.6* 27.7*   Recent Labs    10/16/23 0329 10/17/23 0312 10/18/23 0400  NA 136 133* 132*  K 4.1 4.3 4.1  CL 105 102 101  CO2 22 21* 21*  GLUCOSE 97 101* 90  BUN 52* 62* 76*  CREATININE 1.90* 2.49* 2.69*  CALCIUM 8.9 9.0 9.0   No results for input(s): "LABPT", "INR" in the last 72 hours. No results for input(s): "LABURIN" in the last 72 hours. Results for orders placed or performed during the hospital encounter of 09/28/23  Urine Culture (for pregnant, neutropenic or urologic patients or patients with an indwelling urinary catheter)     Status: Abnormal   Collection Time: 10/06/23  8:43 AM   Specimen: Urine, Clean Catch  Result Value Ref Range Status   Specimen Description   Final    URINE, CLEAN CATCH Performed at [facility], [address], [place], [place] [address]    Special Requests   Final    NONE Performed at [facility], [address], [place], [place] [address]    Culture 80,000 COLONIES/mL PSEUDOMONAS AERUGINOSA (A)  Final   Report Status 10/08/2023 FINAL  Final   Organism ID, Bacteria PSEUDOMONAS AERUGINOSA (A)  Final      Susceptibility   Pseudomonas aeruginosa - MIC*    CEFTAZIDIME 4 SENSITIVE Sensitive     CIPROFLOXACIN <=0.25 SENSITIVE Sensitive     GENTAMICIN <=1 SENSITIVE Sensitive     IMIPENEM 1 SENSITIVE Sensitive     PIP/TAZO <=4 SENSITIVE Sensitive ug/mL    CEFEPIME 2 SENSITIVE Sensitive     * 80,000 COLONIES/mL PSEUDOMONAS AERUGINOSA    Studies/Results: No results found.  Assessment: Acute renal  insufficiency secondary to extrinsic compression, failing stents  Procedure(s): CYSTOSCOPY WITH BILATERAL RETROGRADE PYELOGRAM and BILATERAL STENT PLACEMENT, 13 Days Post-Op  doing well.  Plan: I had an extensive discussion with the patient, his sister, his brother in regards to the extrinsic compression and hydronephrosis.  Stents are likely failing and would benefit from  nephrostomy tube placement if he desires intervention.  I did discuss the risk of not placing nephrostomy tubes which would be progression of the renal failure and ultimately death.  At this point in time he is leaning towards nephrostomy tube placement.  I will place the order for that.   Modena Slater, MD Urology 10/18/2023, 4:55 PM

## 2023-10-18 NOTE — Plan of Care (Signed)
?  Problem: Clinical Measurements: ?Goal: Respiratory complications will improve ?Outcome: Progressing ?  ?Problem: Activity: ?Goal: Risk for activity intolerance will decrease ?Outcome: Progressing ?  ?Problem: Nutrition: ?Goal: Adequate nutrition will be maintained ?Outcome: Progressing ?  ?Problem: Coping: ?Goal: Level of anxiety will decrease ?Outcome: Progressing ?  ?

## 2023-10-19 ENCOUNTER — Inpatient Hospital Stay (HOSPITAL_COMMUNITY): Payer: No Typology Code available for payment source

## 2023-10-19 ENCOUNTER — Other Ambulatory Visit: Payer: Self-pay

## 2023-10-19 DIAGNOSIS — N179 Acute kidney failure, unspecified: Secondary | ICD-10-CM | POA: Diagnosis not present

## 2023-10-19 LAB — RENAL FUNCTION PANEL
Albumin: 3.2 g/dL — ABNORMAL LOW (ref 3.5–5.0)
Anion gap: 12 (ref 5–15)
BUN: 82 mg/dL — ABNORMAL HIGH (ref 8–23)
CO2: 19 mmol/L — ABNORMAL LOW (ref 22–32)
Calcium: 9.3 mg/dL (ref 8.9–10.3)
Chloride: 102 mmol/L (ref 98–111)
Creatinine, Ser: 2.41 mg/dL — ABNORMAL HIGH (ref 0.61–1.24)
GFR, Estimated: 27 mL/min — ABNORMAL LOW (ref >60)
Glucose, Bld: 96 mg/dL (ref 70–99)
Phosphorus: 4.3 mg/dL (ref 2.5–4.6)
Potassium: 4.3 mmol/L (ref 3.5–5.1)
Sodium: 133 mmol/L — ABNORMAL LOW (ref 135–145)

## 2023-10-19 LAB — BASIC METABOLIC PANEL
Anion gap: 12 (ref 5–15)
BUN: 82 mg/dL — ABNORMAL HIGH (ref 8–23)
CO2: 19 mmol/L — ABNORMAL LOW (ref 22–32)
Calcium: 9.3 mg/dL (ref 8.9–10.3)
Chloride: 102 mmol/L (ref 98–111)
Creatinine, Ser: 2.35 mg/dL — ABNORMAL HIGH (ref 0.61–1.24)
GFR, Estimated: 28 mL/min — ABNORMAL LOW (ref >60)
Glucose, Bld: 97 mg/dL (ref 70–99)
Potassium: 4.3 mmol/L (ref 3.5–5.1)
Sodium: 133 mmol/L — ABNORMAL LOW (ref 135–145)

## 2023-10-19 LAB — CBC WITH DIFFERENTIAL/PLATELET
Abs Immature Granulocytes: 0.65 10*3/uL — ABNORMAL HIGH (ref 0.00–0.07)
Basophils Absolute: 0 10*3/uL (ref 0.0–0.1)
Basophils Relative: 0 %
Eosinophils Absolute: 0 10*3/uL (ref 0.0–0.5)
Eosinophils Relative: 0 %
HCT: 27.4 % — ABNORMAL LOW (ref 39.0–52.0)
Hemoglobin: 9.1 g/dL — ABNORMAL LOW (ref 13.0–17.0)
Immature Granulocytes: 7 %
Lymphocytes Relative: 3 %
Lymphs Abs: 0.2 10*3/uL — ABNORMAL LOW (ref 0.7–4.0)
MCH: 29.6 pg (ref 26.0–34.0)
MCHC: 33.2 g/dL (ref 30.0–36.0)
MCV: 89.3 fL (ref 80.0–100.0)
Monocytes Absolute: 0.6 10*3/uL (ref 0.1–1.0)
Monocytes Relative: 6 %
Neutro Abs: 7.7 10*3/uL (ref 1.7–7.7)
Neutrophils Relative %: 84 %
Platelets: 68 10*3/uL — ABNORMAL LOW (ref 150–400)
RBC: 3.07 MIL/uL — ABNORMAL LOW (ref 4.22–5.81)
RDW: 17.1 % — ABNORMAL HIGH (ref 11.5–15.5)
WBC: 9.2 10*3/uL (ref 4.0–10.5)
nRBC: 0 % (ref 0.0–0.2)

## 2023-10-19 LAB — SURGICAL PCR SCREEN
MRSA, PCR: NEGATIVE
Staphylococcus aureus: NEGATIVE

## 2023-10-19 LAB — PROTIME-INR
INR: 1.3 — ABNORMAL HIGH (ref 0.8–1.2)
Prothrombin Time: 16.7 s — ABNORMAL HIGH (ref 11.4–15.2)

## 2023-10-19 NOTE — Progress Notes (Signed)
[facility] Progress Note   Subjective: Pt seen in room. Creatinine down to 2.4 today.   Vitals:   10/18/23 1821 10/18/23 2143 10/19/23 0547 10/19/23 1245  BP: 104/76 111/80 95/62 116/79  Pulse: 79 83 82 (!) 57  Resp: 16 18 18  (!) 21  Temp: 98 F (36.7 C) (!) 97.5 F (36.4 C) 97.9 F (36.6 C)   TempSrc: Oral Oral Oral   SpO2: 100% 100% 100% 100%  Weight:   106.5 kg   Height:        Exam: General:NAD, comfortable Heart:RRR, s1s2 nl Lungs:clear b/l, no crackle Abdomen:soft, mild distention and abdomen wall has edema. Extremities: 2-3+ bilat LE pitting edema of LE's and scrotum Neurology: Alert, awake and following commands.   Assessment/ Plan: Acute kidney injury on CKD stage IIIa likely due to combination of obstructive uropathy and ischemic ATN from relative hypotension.  S/P bilateral ureteral stent placement by urology on 12/06. Creat peaked at 5.12 on 12/07 and then improved down to 1.4 on 12/15. Now creat rising again up to 2-2.5 range. Hydronephrosis never really went away per imaging (mild hydro bilat pre and post). Creat down today. Suspect ATN due to hypotensive BP's on 12/14-12/15 (80s-90s) as cause of AKI. Better today, bp's are more stable. Try to keep SBP > 100, consider midodrine if BP's drop again. No other suggestions. Will sign off.  Bilateral hydronephrosis: due to extrinsic lymph node compression. SP post bilateral ureteral stent 12/06 per urology. S/P course of CBI per urology as well.  Severe bilat LE edema: no resp issues, pt remains on room air. Edema limited to the pelvis and LE's may be due to his DVT and/or pelvic blockages of lymphatic and venous drainage due to progressive lymphoma.  Metastatic mantle cell lymphoma: per oncology pt not a candidate for further treatment. Oncology recommending hospice. Palliative care following. Poor prognosis.    Vinson Moselle MD  [facility] 10/11/2023, 2:35 PM  Recent Labs  Lab 10/10/23 0322 10/11/23 0424  10/11/23 0425  HGB 6.6*  --  7.8*  ALBUMIN 2.7* 2.7*  --   CALCIUM 8.2* 8.4*  --   PHOS 5.3* 3.5  --   CREATININE 3.91* 2.65*  --   K 4.0 3.5  --    No results for input(s): "IRON", "TIBC", "FERRITIN" in the last 168 hours. Inpatient medications:  atorvastatin  10 mg Oral QHS   Chlorhexidine Gluconate Cloth  6 each Topical Daily   docusate sodium  100 mg Oral BID   feeding supplement  237 mL Oral BID BM   liver oil-zinc oxide   Topical BID   multivitamin with minerals  1 tablet Oral Daily   oxybutynin  10 mg Oral QHS   pantoprazole  40 mg Oral Daily   predniSONE  40 mg Oral Q breakfast   senna  1 tablet Oral BID   sodium bicarbonate  650 mg Oral BID   sodium chloride flush  3 mL Intravenous Q12H     acetaminophen **OR** acetaminophen, bisacodyl, cyclobenzaprine, HYDROcodone-acetaminophen, ondansetron **OR** ondansetron (ZOFRAN) IV, polyethylene glycol, sodium chloride flush, sodium chloride flush, traZODone

## 2023-10-19 NOTE — Progress Notes (Addendum)
Patient ID: Jerry Tran, male   DOB: Apr 01, 1946, 77 y.o.   MRN: [id number] Request received for bilateral percutaneous nephrostomy tube placements in patient.  He is a 77 year old male with history of mantle cell lymphoma, CHF, chronic kidney disease, paroxysmal atrial fibrillation, hypertension, hyperlipidemia, arthritis, PTSD, GERD, prostate cancer, recently noted PE, hematuria, bilateral ureteral stent placement on 10/05/2023 secondary to extrinsic compression and bilateral hydronephrosis from lymphoma.  Latest imaging studies, labs have been reviewed by Dr. Grace Isaac.  Patient currently afebrile, normal WBC, hemoglobin 9.1, platelets 68K and creatinine 2.35 down from 2.69 yesterday.  Minimal bilateral hydronephrosis noted on patient's latest CT.  Per Dr. Grace Isaac and following discussion with Dr. Alvester Morin we are holding on bilateral PCN placements at this time in light of minimal hydronephrosis and improving creatinine level.  Should patient's clinical status/lab studies worsen will reconsider PCN placements. Above plans d/w pt/brother.

## 2023-10-19 NOTE — Progress Notes (Signed)
Physical Therapy Treatment Patient Details Name: Jerry Tran MRN: [id number] DOB: 11/12/1945 Today's Date: 10/19/2023   History of Present Illness 77 yo male admitted to hospital 09/28/23 with dyspnea, LE + groin swelling. Found to have AKI, lymphadenopathy in setting of lymphoma. Hx mantle cell lymphoma, CKD, CHF, Afib, PTSD, prostate Ca s/p radiation, arthritis. Eval and d/c on 09/30/23. S/P bil ureteral stent placement 12/6. Acute PE and bil LE DVTs 10/06/23. Anemia, hematuria witih imaging on 12/10 showing possible intramuscular hemorrhage psoas muscles. Reorder PT eval 10/10/23 2* medical and functional decline. Palliative care now involved.    PT Comments   Pt admitted with above diagnosis.  Pt currently with functional limitations due to the deficits listed below (see PT Problem List). Pt in bed when PT arrived. Pt reported the tube is gone and I can eat. Pts family to bring him food and pt agreeable to working prior to their return. Pt required increased time and Min  A for L LE to EOB, pt continues to exhibit B LE pitting edema. Pt required CGA and min cues for sit to stand  from elevated EOB, pt required CGA and progressed to close S and cues for gait tasks in hallway up to 280 feet with RW. Pt left seated in recliner with use of pillows as props and standard chair to elevate distal B LE and all needs in place. D/c plan remains appropriate pending progression SNF vs HH services with family assist. Pt will benefit from acute skilled PT to increase their independence and safety with mobility to allow discharge.      If plan is discharge home, recommend the following: A little help with bathing/dressing/bathroom;Assistance with cooking/housework;Assist for transportation;Help with stairs or ramp for entrance   Can travel by private vehicle     Yes  Equipment Recommendations  Rolling walker (2 wheels)    Recommendations for Other Services       Precautions / Restrictions  Precautions Precautions: Fall Precaution Comments: significant scrotal and LE edema Restrictions Weight Bearing Restrictions Per Provider Order: No     Mobility  Bed Mobility Overal bed mobility: Needs Assistance Bed Mobility: Supine to Sit     Supine to sit: HOB elevated, Used rails, Min assist     General bed mobility comments: increased time due to scrotal swelling and discomfort min A for LLE to EOB    Transfers Overall transfer level: Needs assistance Equipment used: Rolling walker (2 wheels) Transfers: Sit to/from Stand Sit to Stand: Contact guard assist, From elevated surface           General transfer comment: min cues wide BOS and strong power up    Ambulation/Gait Ambulation/Gait assistance: Contact guard assist, Supervision Gait Distance (Feet): 280 Feet Assistive device: Rolling walker (2 wheels) Gait Pattern/deviations: Wide base of support, Step-to pattern Gait velocity: slightly decreased     General Gait Details: RW for ambulation safety with pt progressing from CGA to close S with min cues for posture and safety   Stairs             Wheelchair Mobility     Tilt Bed    Modified Rankin (Stroke Patients Only)       Balance Overall balance assessment: Needs assistance Sitting-balance support: Feet supported, No upper extremity supported Sitting balance-Leahy Scale: Good     Standing balance support: Bilateral upper extremity supported, Reliant on assistive device for balance Standing balance-Leahy Scale: Fair  Cognition Arousal: Alert Behavior During Therapy: WFL for tasks assessed/performed Overall Cognitive Status: Within Functional Limits for tasks assessed                                 General Comments: Oriented x4, able to follow commands without difficulty        Exercises      General Comments        Pertinent Vitals/Pain Pain Assessment Pain  Assessment: No/denies pain (scrotal discomfort with scooting to EOB but reports no pain)    Home Living                          Prior Function            PT Goals (current goals can now be found in the care plan section) Acute Rehab PT Goals Patient Stated Goal: to be able to exercise PT Goal Formulation: With patient/family Time For Goal Achievement: 10/24/23 Potential to Achieve Goals: Good Progress towards PT goals: Progressing toward goals    Frequency    Min 1X/week      PT Plan      Co-evaluation              AM-PAC PT "6 Clicks" Mobility   Outcome Measure  Help needed turning from your back to your side while in a flat bed without using bedrails?: A Little Help needed moving from lying on your back to sitting on the side of a flat bed without using bedrails?: A Little Help needed moving to and from a bed to a chair (including a wheelchair)?: A Little Help needed standing up from a chair using your arms (e.g., wheelchair or bedside chair)?: A Little Help needed to walk in hospital room?: A Little Help needed climbing 3-5 steps with a railing? : A Lot 6 Click Score: 17    End of Session Equipment Utilized During Treatment: Gait belt Activity Tolerance: Patient tolerated treatment well Patient left: with call bell/phone within reach;in chair Nurse Communication: Mobility status PT Visit Diagnosis: Muscle weakness (generalized) (M62.81);Difficulty in walking, not elsewhere classified (R26.2)     Time: 9563-8756 PT Time Calculation (min) (ACUTE ONLY): 38 min  Charges:    $Gait Training: 8-22 mins $Therapeutic Activity: 23-37 mins PT General Charges $$ ACUTE PT VISIT: 1 Visit                     Johnny Bridge, PT Acute Rehab    Jacqualyn Posey 10/19/2023, 3:17 PM

## 2023-10-19 NOTE — TOC Progression Note (Signed)
Transition of Care [facility]) - Progression Note   Patient Details  Name: Jerry Tran MRN: [id number] Date of Birth: Aug 22, 1946  [facility] CM/SW Contact  Ewing Schlein, LCSW Phone Number: 10/19/2023, 11:48 AM  Clinical Narrative: PASRR received: [id number] H. Patient is not medically stable for discharge today. CSW spoke with Gavin Pound in admissions at [facility] and the facility's pharmacy cannot provide the patient's medications for a possible weekend discharge, so a Monday admission is expected. CSW updated hospitalist.  Expected Discharge Plan: Skilled Nursing Facility Barriers to Discharge: Continued Medical Work up  Expected Discharge Plan and Services Living arrangements for the past 2 months: Apartment  Social Determinants of Health (SDOH) Interventions SDOH Screenings   Food Insecurity: No Food Insecurity (09/29/2023)  Housing: Low Risk  (09/29/2023)  Transportation Needs: No Transportation Needs (09/29/2023)  Utilities: Not At Risk (09/29/2023)  Alcohol Screen: Low Risk  (08/08/2023)  Depression (PHQ2-9): Low Risk  (04/03/2023)  Tobacco Use: Medium Risk (10/05/2023)  Health Literacy: Adequate Health Literacy (08/08/2023)   Readmission Risk Interventions     No data to display

## 2023-10-19 NOTE — Progress Notes (Signed)
PROGRESS NOTE    Nishawn Herston  ONG:[id number] DOB: 04-Jun-1946 DOA: 09/28/2023 [facility]CP: [name]   Brief Narrative:  This 77 y.o. male with medical history significant of mantle cell lymphoma, CKD IIIA, diastolic CHF, A-fib, HLD presented with B/L leg swelling and groin swelling for the past 2 weeks and worsening shortness of breath. On presentation, BUN/creatinine were more than 120 and 3.0. CT of abdomen showed progressive adenopathy along with new bilateral hydroureteronephrosis secondary to compressive effect lower bladder thickening and anasarca. Oncology and urology were consulted. He underwent ureteric stent placement for extrinsic compression on 10/05/2023. On 10/06/2023, he was found to be more tachypneic: VQ scan was suggestive of 3 moderate-sized subsegmental perfusion defects compatible with PE. He was started on heparin drip. He subsequently had hematuria requiring CBI. On 10/09/2023, hemoglobin was found to be 4.1: He was transfused 3 units packed red cells. Heparin drip was stopped. Palliative care also consulted.    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   Hemoglobin was found to be 4.1 on 10/09/2023: Status post 4 units packed red cells transfusion.   Hemoglobin 9.0 this morning.  Monitor H&H daily. Patient reports having intermittent hematuria. CT A/P without contrast on 10/09/2023 suggested  possible intramuscular hemorrhage in the psoas muscles with bilateral ureteral stents with mild bilateral hydronephrosis. Heparin remains on hold.  Will follow-up with oncology regarding plan for anticoagulation.   At this time, patient/family want to stay off of anticoagulation.   Acute hypoxic respiratory failure: Improving Possible PE with bilateral acute lower extremity DVT. VQ scan : PE+,   Heparin plan as above. Currently on room air   AKI on CKD stage IIIa: Acute metabolic acidosis > Improved. Due to combination of obstructive uropathy and ischemic ATN from relative hypotension. Creatinine was improving, Nephrology signed off on 10/13/2023 and recommended to start Lasix 40 mg p.o. twice a day.  BP currently on the lower side.  Will start Lasix if blood pressure improves. Renal functions worsening again, nephrology reconsulted.   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 Status post ureteric stent placement on 10/05/2023.  Urology following. Still having hematuria.   Urine culture grew 80,000 colonies per mL of Pseudomonas aeruginosa from 10/06/2023.   Completed 7 days of ciprofloxacin. Urology recommended placement of nephrostomy tubes and family agreeable if renal functions worsens..  Hyponatremia: Improved.   Hypokalemia: Replaced.  Continue to monitor.   Thrombocytopenia Due to mantle cell lymphoma.  Platelets improving to 69 today.  Monitor   Extensive retroperitoneal and pelvic lymphadenopathy Lymphedema Mantle cell lymphoma Goals of care Likely due to significant involvement of abdominal and pelvic lymph nodes by his mantle cell lymphoma. Oncology following.  Patient is apparently not a candidate for further treatment at this time.  Currently on prednisone.  Oncology recommended hospice.   Palliative care following.  Patient wants to remain full code and wants to  continue current scope of  treatment.   DVT prophylaxis: SCDs Code Status: Full code Family Communication:Wife at bed side Disposition Plan:  Status is: Inpatient Remains inpatient appropriate because: Due to severity of illness.   Consultants:  Cardiology Urology Nephrology Palliative care  Procedures:  None Antimicrobials:  Anti-infectives (From admission, onward)    Start     Dose/Rate Route Frequency Ordered Stop   10/13/23 0800  ciprofloxacin (CIPRO) tablet 500 mg  Status:  Discontinued        500 mg Oral Daily with breakfast 10/12/23 1506 10/14/23 0929   10/12/23 2000  ciprofloxacin (CIPRO) tablet 500 mg  Status:  Discontinued        500 mg Oral 2 times daily 10/12/23 1505 10/12/23 1506   10/10/23 1430  ciprofloxacin (CIPRO) tablet 500 mg  Status:  Discontinued        500 mg Oral Daily 10/10/23 1332 10/12/23 1505   10/09/23 0800  ciprofloxacin (CIPRO) tablet 500 mg  Status:  Discontinued        500 mg Oral Daily with breakfast 10/08/23 1035 10/08/23 1049   10/08/23 1145  ciprofloxacin (CIPRO) tablet 500 mg        500 mg Oral Daily with breakfast 10/08/23 1049 10/11/23 0759   10/07/23 1800  amoxicillin-clavulanate (AUGMENTIN) 500-125 MG per tablet 1 tablet  Status:  Discontinued        1 tablet Oral Every 12 hours 10/07/23 1333 10/08/23 1035   10/06/23 1100  Ampicillin-Sulbactam (UNASYN) 3 g in sodium chloride 0.9 % 100 mL IVPB  Status:  Discontinued        3 g 200 mL/hr over 30 Minutes Intravenous Every 12 hours 10/06/23 0948 10/07/23 1333   10/05/23 1038  ceFAZolin (ANCEF) IVPB 2g/100 mL premix        2 g 200 mL/hr over 30 Minutes Intravenous 30 min pre-op 10/05/23 1038 10/05/23 1549      Subjective: Patient was seen and examined at bedside.  Overnight events noted. Patient reports feeling much better.  Hematuria has improved.  Still has bilateral leg swelling. Renal functions are improving.  Objective: Vitals:   10/18/23 1430 10/18/23 1821 10/18/23 2143 10/19/23 0547  BP: 117/66 104/76  111/80 95/62  Pulse: 69 79 83 82  Resp: 16 16 18 18   Temp: 98 F (36.7 C) 98 F (36.7 C) (!) 97.5 F (36.4 C) 97.9 F (36.6 C)  TempSrc: Oral Oral Oral Oral  SpO2: 100% 100% 100% 100%  Weight:    106.5 kg  Height:        Intake/Output Summary (Last 24 hours) at 10/19/2023 1016 Last data filed at 10/19/2023 0903 Gross per 24 hour  Intake 600 ml  Output 349 ml  Net 251 ml   Filed Weights   10/16/23 0441 10/18/23 0628 10/19/23 0547  Weight: 105.9 kg 107 kg 106.5 kg    Examination:  General exam: Appears comfortable, deconditioned, not in any acute distress. Respiratory system: CTA bilaterally.  Respiratory effort normal. RR 16 Cardiovascular system: S1 & S2 heard, RRR. No JVD, murmurs, rubs, gallops or clicks.  Gastrointestinal system: Abdomen is non distended, soft and non tender. Normal bowel sounds heard. Central nervous system: Alert and oriented X 3. No focal neurological deficits. Extremities:  Edema++, No cyanosis, No clubbing Skin: No rashes, lesions or ulcers Psychiatry: Judgement and insight appear normal. Mood & affect appropriate.     Data Reviewed: I have personally reviewed following labs and imaging studies  CBC: Recent Labs  Lab 10/14/23 0332 10/15/23 0338 10/16/23 0329 10/18/23 0400 10/19/23 0333  WBC 8.3 8.5 8.1 6.5 9.2  NEUTROABS  --   --   --   --  7.7  HGB 8.5* 8.9* 8.9* 9.0* 9.1*  HCT 26.0* 27.5* 27.6* 27.7* 27.4*  MCV 89.3 90.5 89.6 89.4 89.3  PLT 73* 74* 69* 72* 68*   Basic Metabolic Panel: Recent Labs  Lab 10/13/23 0220 10/14/23 0332 10/15/23 0338 10/16/23 0329 10/17/23 0312 10/18/23 0400 10/19/23 0333  NA 135 136 136 136 133* 132* 133*  133*  K 3.3* 3.0* 3.4* 4.1 4.3 4.1 4.3  4.3  CL 103 106 106 105 102 101 102  102  CO2 22 23 21* 22 21* 21* 19*  19*  GLUCOSE 112* 103* 94 97 101* 90 96  97  BUN 52* 48* 48* 52* 62* 76* 82*  82*  CREATININE 1.81* 1.47* 1.63* 1.90* 2.49* 2.69* 2.41*  2.35*  CALCIUM 8.7* 8.7* 8.8* 8.9  9.0 9.0 9.3  9.3  MG 1.7 2.0 1.9 1.9  --  2.1  --   PHOS 2.6 3.0  --  2.9  --  3.9 4.3   GFR: Estimated Creatinine Clearance: 34.4 mL/min (A) (by C-G formula based on SCr of 2.41 mg/dL (H)). Liver Function Tests: Recent Labs  Lab 10/12/23 1136 10/13/23 0220 10/14/23 0332 10/19/23 0333  ALBUMIN 2.8* 3.2* 2.7* 3.2*   No results for input(s): "LIPASE", "AMYLASE" in the last 168 hours. No results for input(s): "AMMONIA" in the last 168 hours. Coagulation Profile: Recent Labs  Lab 10/19/23 0333  INR 1.3*   Cardiac Enzymes: No results for input(s): "CKTOTAL", "CKMB", "CKMBINDEX", "TROPONINI" in the last 168 hours. BNP (last 3 results) No results for input(s): "PROBNP" in the last 8760 hours. HbA1C: No results for input(s): "HGBA1C" in the last 72 hours. CBG: Recent Labs  Lab 10/17/23 0009  GLUCAP 110*   Lipid Profile: No results for input(s): "CHOL", "HDL", "LDLCALC", "TRIG", "CHOLHDL", "LDLDIRECT" in the last 72 hours. Thyroid Function Tests: No results for input(s): "TSH", "T4TOTAL", "FREET4", "T3FREE", "THYROIDAB" in the last 72 hours. Anemia Panel: No results for input(s): "VITAMINB12", "FOLATE", "FERRITIN", "TIBC", "IRON", "RETICCTPCT" in the last 72 hours. Sepsis Labs: No results for input(s): "PROCALCITON", "LATICACIDVEN" in the last 168 hours.  Recent Results (from the past 240 hours)  Surgical pcr screen     Status: None   Collection Time: 10/19/23  2:02 AM   Specimen: Nasal Mucosa; Nasal Swab  Result Value Ref Range Status   MRSA, PCR NEGATIVE NEGATIVE Final   Staphylococcus aureus NEGATIVE NEGATIVE Final    Comment: (NOTE) The Xpert SA Assay (FDA approved for NASAL specimens in patients 39 years of age and older), is one component of a comprehensive surveillance program. It is not intended to diagnose infection nor to guide or monitor treatment. Performed at [facility], [address] [place], [place] [address]      Radiology  Studies: CT ABDOMEN PELVIS WO CONTRAST Result Date: 10/18/2023 CLINICAL DATA:  Kidney failure stenting EXAM: CT ABDOMEN AND PELVIS WITHOUT CONTRAST TECHNIQUE: Multidetector CT imaging of the abdomen and pelvis was performed following the standard protocol without IV contrast. RADIATION DOSE REDUCTION: This exam was performed according to the departmental dose-optimization program which includes automated exposure control, adjustment of the mA and/or kV according to patient size and/or use of iterative reconstruction technique. COMPARISON:  CT 10/09/2023, 09/28/2023 FINDINGS: Lower chest: Lung bases demonstrate minimal atelectasis or scarring. No acute airspace disease.  Differential blood cardiac pool suggesting anemia Hepatobiliary: Stable hepatic cysts. No calcified gallstone or biliary dilatation. Pancreas: Unremarkable. No pancreatic ductal dilatation or surrounding inflammatory changes. Spleen: Normal in size without focal abnormality. Adrenals/Urinary Tract: Adrenal glands are within normal limits. Bilateral ureteral stent with proximal pigtails in the renal collecting system and distal pigtails in the posterior bladder. Small amount of air within the renal collecting systems and adjacent to the proximal left and mid bilateral ureteral stents. Small amount of air within the urinary bladder. Mild hydronephrosis probably similar compared to the most recent prior CT. Stomach/Bowel: Stomach nonenlarged. No dilated small bowel. No acute bowel wall thickening. Negative appendix. Vascular/Lymphatic: Aortic atherosclerosis. Extensive bulky retroperitoneal, pelvic, iliac and inguinal adenopathy corresponding to history of lymphoma. Reproductive: Clips at the prostate. Extensive scrotal edema and fluid. Other: Negative for free air. Small pelvic effusion. Extensive subcutaneous edema. Musculoskeletal: No acute osseous abnormality. Decreased hyperdense psoas muscle enlargement compared to most recent prior CT.  IMPRESSION: 1. Bilateral ureteral stents in place. Similar degree of mild bilateral hydronephrosis. New small amount of air within the renal collecting systems and urinary bladder, correlate for recent instrumentation. If none recently, consider gas-forming infection with reflux of air into the renal collecting systems and ureters. 2. Extensive bulky retroperitoneal, pelvic, iliac and inguinal adenopathy corresponding to history of lymphoma. 3. Extensive subcutaneous edema and scrotal edema/fluid. Small pelvic effusion. 4. Decreased hyperdense psoas muscle enlargement compared to prior likely resolving intramuscular hematomas. 5. Aortic atherosclerosis. Aortic Atherosclerosis (ICD10-I70.0). Electronically Signed   By: Jasmine Pang M.D.   On: 10/18/2023 19:06    Scheduled Meds:  atorvastatin  10 mg Oral QHS   Chlorhexidine Gluconate Cloth  6 each Topical Daily   docusate sodium  100 mg Oral BID   feeding supplement  237 mL Oral BID BM   liver oil-zinc oxide   Topical BID   multivitamin with minerals  1 tablet Oral Daily   oxybutynin  10 mg Oral QHS   pantoprazole  40 mg Oral Daily   predniSONE  40 mg Oral Q breakfast   senna  1 tablet Oral BID   sodium bicarbonate  650 mg Oral BID   sodium chloride flush  3 mL Intravenous Q12H   Continuous Infusions:   LOS: 21 days    Time spent: 35 mins    Willeen Niece, MD [facility]   If 7PM-7AM, please contact night-coverage

## 2023-10-19 NOTE — Progress Notes (Signed)
14 Days Post-Op Subjective: Patient has been transferred to the third floor.  He was resting on my arrival.  No acute events overnight.  We briefly spoke about the fact that his intention was to discharge home on hospice, but his medical decisions are contrary to the spirit of hospice.  I have not encouraged him one way or another to pursue aggressive care, though it does appear that it would be futile from the perspective of the care team and a large.  He maintains that he does want to discharge home in the care of hospice.  Objective: Vital signs in last 24 hours: Temp:  [97.5 F (36.4 C)-98 F (36.7 C)] 97.9 F (36.6 C) (12/20 0547) Pulse Rate:  [69-83] 82 (12/20 0547) Resp:  [16-18] 18 (12/20 0547) BP: (95-117)/(62-80) 95/62 (12/20 0547) SpO2:  [100 %] 100 % (12/20 0547) Weight:  [106.5 kg] 106.5 kg (12/20 0547)  Assessment/Plan: # Bilateral hydronephrosis  # Penoscrotal edema  2/2 extrinsic lymphatic compression  S/p bilateral ureteral stent placement with Dr. Alvester Morin on 12/6 Diffuse anasarca.  Recommend keeping scrotum/penis elevated.    #Hematuria #ABLA #Pulmonary embolism #AoCKD  3 filling defects present on VQ scan, consistent with PE. These appear to be subsegmental. Heparin stopped after retroperitoneal bleed.   HgB remains stable     Serum creatinine continued to elevate while Foley was in place and after.  CT A/P did not reveal significantly worsening hydronephrosis, which would indicate ureteral stent failure.  Stents placed in the context of extrinsic compression fail at a very high rate, however it would be odd for them to both fail so quickly and at the same time.  Patient reports that his fluid restrictions have been removed and his oral intake is picking up.  Hopefully this is a matter volume. Percutaneous nephrostomy tubes were originally ordered to rule out any obstructive uropathy the may not be immediately clear on CT imaging.  Though, those were put on hold  when small improvement was seen on morning labs.  No further urologic interventions on this admission. IR will reconsider PCNT if renal indices continue to degrade after adequate volume resuscitation. In this case, ureteral stents can be removed on an outpt basis or IR can remove them antegrade during nephrostomy placement.    Intake/Output from previous day: 12/19 0701 - 12/20 0700 In: 840 [P.O.:840] Out: 324 [Urine:324]  Intake/Output this shift: Total I/O In: -  Out: 75 [Urine:75]  Physical Exam:  General: Sleeping CV: No cyanosis Lungs: equal chest rise Gu: foley out  Lab Results: Recent Labs    10/18/23 0400 10/19/23 0333  HGB 9.0* 9.1*  HCT 27.7* 27.4*   BMET Recent Labs    10/18/23 0400 10/19/23 0333  NA 132* 133*  133*  K 4.1 4.3  4.3  CL 101 102  102  CO2 21* 19*  19*  GLUCOSE 90 96  97  BUN 76* 82*  82*  CREATININE 2.69* 2.41*  2.35*  CALCIUM 9.0 9.3  9.3     Studies/Results: CT ABDOMEN PELVIS WO CONTRAST Result Date: 10/18/2023 CLINICAL DATA:  Kidney failure stenting EXAM: CT ABDOMEN AND PELVIS WITHOUT CONTRAST TECHNIQUE: Multidetector CT imaging of the abdomen and pelvis was performed following the standard protocol without IV contrast. RADIATION DOSE REDUCTION: This exam was performed according to the departmental dose-optimization program which includes automated exposure control, adjustment of the mA and/or kV according to patient size and/or use of iterative reconstruction technique. COMPARISON:  CT 10/09/2023, 09/28/2023 FINDINGS:  Lower chest: Lung bases demonstrate minimal atelectasis or scarring. No acute airspace disease. Differential blood cardiac pool suggesting anemia Hepatobiliary: Stable hepatic cysts. No calcified gallstone or biliary dilatation. Pancreas: Unremarkable. No pancreatic ductal dilatation or surrounding inflammatory changes. Spleen: Normal in size without focal abnormality. Adrenals/Urinary Tract: Adrenal glands are within  normal limits. Bilateral ureteral stent with proximal pigtails in the renal collecting system and distal pigtails in the posterior bladder. Small amount of air within the renal collecting systems and adjacent to the proximal left and mid bilateral ureteral stents. Small amount of air within the urinary bladder. Mild hydronephrosis probably similar compared to the most recent prior CT. Stomach/Bowel: Stomach nonenlarged. No dilated small bowel. No acute bowel wall thickening. Negative appendix. Vascular/Lymphatic: Aortic atherosclerosis. Extensive bulky retroperitoneal, pelvic, iliac and inguinal adenopathy corresponding to history of lymphoma. Reproductive: Clips at the prostate. Extensive scrotal edema and fluid. Other: Negative for free air. Small pelvic effusion. Extensive subcutaneous edema. Musculoskeletal: No acute osseous abnormality. Decreased hyperdense psoas muscle enlargement compared to most recent prior CT. IMPRESSION: 1. Bilateral ureteral stents in place. Similar degree of mild bilateral hydronephrosis. New small amount of air within the renal collecting systems and urinary bladder, correlate for recent instrumentation. If none recently, consider gas-forming infection with reflux of air into the renal collecting systems and ureters. 2. Extensive bulky retroperitoneal, pelvic, iliac and inguinal adenopathy corresponding to history of lymphoma. 3. Extensive subcutaneous edema and scrotal edema/fluid. Small pelvic effusion. 4. Decreased hyperdense psoas muscle enlargement compared to prior likely resolving intramuscular hematomas. 5. Aortic atherosclerosis. Aortic Atherosclerosis (ICD10-I70.0). Electronically Signed   By: Jasmine Pang M.D.   On: 10/18/2023 19:06       LOS: 21 days   Elmon Kirschner, NP [facility] Pager: [phone]  10/19/2023, 11:42 AM

## 2023-10-20 ENCOUNTER — Encounter (HOSPITAL_COMMUNITY): Payer: Self-pay | Admitting: Internal Medicine

## 2023-10-20 ENCOUNTER — Inpatient Hospital Stay (HOSPITAL_COMMUNITY): Payer: No Typology Code available for payment source

## 2023-10-20 DIAGNOSIS — N179 Acute kidney failure, unspecified: Secondary | ICD-10-CM | POA: Diagnosis not present

## 2023-10-20 HISTORY — PX: IR NEPHROSTOMY PLACEMENT LEFT: IMG6063

## 2023-10-20 HISTORY — PX: IR NEPHROSTOMY PLACEMENT RIGHT: IMG6064

## 2023-10-20 LAB — CBC
HCT: 25.7 % — ABNORMAL LOW (ref 39.0–52.0)
Hemoglobin: 8.6 g/dL — ABNORMAL LOW (ref 13.0–17.0)
MCH: 29.6 pg (ref 26.0–34.0)
MCHC: 33.5 g/dL (ref 30.0–36.0)
MCV: 88.3 fL (ref 80.0–100.0)
Platelets: 60 10*3/uL — ABNORMAL LOW (ref 150–400)
RBC: 2.91 MIL/uL — ABNORMAL LOW (ref 4.22–5.81)
RDW: 17.2 % — ABNORMAL HIGH (ref 11.5–15.5)
WBC: 10.7 10*3/uL — ABNORMAL HIGH (ref 4.0–10.5)
nRBC: 0 % (ref 0.0–0.2)

## 2023-10-20 LAB — BASIC METABOLIC PANEL WITH GFR
Anion gap: 12 (ref 5–15)
BUN: 89 mg/dL — ABNORMAL HIGH (ref 8–23)
CO2: 18 mmol/L — ABNORMAL LOW (ref 22–32)
Calcium: 8.9 mg/dL (ref 8.9–10.3)
Chloride: 100 mmol/L (ref 98–111)
Creatinine, Ser: 3.21 mg/dL — ABNORMAL HIGH (ref 0.61–1.24)
GFR, Estimated: 19 mL/min — ABNORMAL LOW
Glucose, Bld: 105 mg/dL — ABNORMAL HIGH (ref 70–99)
Potassium: 4.3 mmol/L (ref 3.5–5.1)
Sodium: 130 mmol/L — ABNORMAL LOW (ref 135–145)

## 2023-10-20 LAB — RENAL FUNCTION PANEL
Albumin: 3 g/dL — ABNORMAL LOW (ref 3.5–5.0)
Anion gap: 13 (ref 5–15)
BUN: 89 mg/dL — ABNORMAL HIGH (ref 8–23)
CO2: 19 mmol/L — ABNORMAL LOW (ref 22–32)
Calcium: 9.1 mg/dL (ref 8.9–10.3)
Chloride: 100 mmol/L (ref 98–111)
Creatinine, Ser: 3.1 mg/dL — ABNORMAL HIGH (ref 0.61–1.24)
GFR, Estimated: 20 mL/min — ABNORMAL LOW (ref >60)
Glucose, Bld: 100 mg/dL — ABNORMAL HIGH (ref 70–99)
Phosphorus: 4.7 mg/dL — ABNORMAL HIGH (ref 2.5–4.6)
Potassium: 4.5 mmol/L (ref 3.5–5.1)
Sodium: 132 mmol/L — ABNORMAL LOW (ref 135–145)

## 2023-10-20 LAB — PHOSPHORUS: Phosphorus: 5 mg/dL — ABNORMAL HIGH (ref 2.5–4.6)

## 2023-10-20 LAB — MAGNESIUM: Magnesium: 2.3 mg/dL (ref 1.7–2.4)

## 2023-10-20 MED ORDER — SODIUM CHLORIDE 0.9 % IV SOLN
INTRAVENOUS | Status: AC
  Filled 2023-10-20: qty 20

## 2023-10-20 MED ORDER — LIDOCAINE-EPINEPHRINE 1 %-1:100000 IJ SOLN
INTRAMUSCULAR | Status: AC
  Filled 2023-10-20: qty 1

## 2023-10-20 MED ORDER — FENTANYL CITRATE (PF) 100 MCG/2ML IJ SOLN
INTRAMUSCULAR | Status: AC
  Filled 2023-10-20: qty 2

## 2023-10-20 MED ORDER — MIDAZOLAM HCL 2 MG/2ML IJ SOLN
INTRAMUSCULAR | Status: AC | PRN
  Administered 2023-10-20 (×2): 1 mg via INTRAVENOUS

## 2023-10-20 MED ORDER — FENTANYL CITRATE (PF) 100 MCG/2ML IJ SOLN
INTRAMUSCULAR | Status: AC | PRN
  Administered 2023-10-20 (×2): 50 ug via INTRAVENOUS

## 2023-10-20 MED ORDER — SODIUM CHLORIDE 0.9 % IV SOLN
2.0000 g | INTRAVENOUS | Status: AC
  Administered 2023-10-20: 2 g via INTRAVENOUS

## 2023-10-20 MED ORDER — MIDODRINE HCL 5 MG PO TABS
5.0000 mg | ORAL_TABLET | Freq: Three times a day (TID) | ORAL | Status: DC
  Administered 2023-10-20 – 2023-10-21 (×4): 5 mg via ORAL
  Filled 2023-10-20 (×4): qty 1

## 2023-10-20 MED ORDER — LIDOCAINE HCL 1 % IJ SOLN
20.0000 mL | Freq: Once | INTRAMUSCULAR | Status: AC
  Administered 2023-10-20: 15 mL via INTRADERMAL
  Filled 2023-10-20: qty 20

## 2023-10-20 MED ORDER — IOHEXOL 300 MG/ML  SOLN
50.0000 mL | Freq: Once | INTRAMUSCULAR | Status: DC | PRN
Start: 2023-10-20 — End: 2023-10-22

## 2023-10-20 MED ORDER — MIDAZOLAM HCL 2 MG/2ML IJ SOLN
INTRAMUSCULAR | Status: AC
Start: 2023-10-20 — End: ?
  Filled 2023-10-20: qty 2

## 2023-10-20 NOTE — Progress Notes (Signed)
15 Days Post-Op Subjective: Patient is resting comfortably in bed with no complaints.  Serum creatinine rose to 3.2 (previously 2.4).  Objective: Vital signs in last 24 hours: Temp:  [97.4 F (36.3 C)-97.9 F (36.6 C)] 97.9 F (36.6 C) (12/21 0612) Pulse Rate:  [57-96] 96 (12/21 0612) Resp:  [18-21] 18 (12/21 0612) BP: (90-116)/(59-79) 90/59 (12/21 0612) SpO2:  [96 %-100 %] 96 % (12/21 0612) Weight:  [108.2 kg] 108.2 kg (12/21 0500)  Intake/Output from previous day: 12/20 0701 - 12/21 0700 In: 240 [P.O.:240] Out: 600 [Urine:600]  Intake/Output this shift: No intake/output data recorded.  Physical Exam:  General: Alert and oriented Extremities/GU: Diffuse anasarca with penoscrotal edema  Lab Results: Recent Labs    10/18/23 0400 10/19/23 0333 10/20/23 0258  HGB 9.0* 9.1* 8.6*  HCT 27.7* 27.4* 25.7*   BMET Recent Labs    10/19/23 0333 10/20/23 0258  NA 133*  133* 132*  130*  K 4.3  4.3 4.5  4.3  CL 102  102 100  100  CO2 19*  19* 19*  18*  GLUCOSE 96  97 100*  105*  BUN 82*  82* 89*  89*  CREATININE 2.41*  2.35* 3.10*  3.21*  CALCIUM 9.3  9.3 9.1  8.9     Studies/Results: CT ABDOMEN PELVIS WO CONTRAST Result Date: 10/18/2023 CLINICAL DATA:  Kidney failure stenting EXAM: CT ABDOMEN AND PELVIS WITHOUT CONTRAST TECHNIQUE: Multidetector CT imaging of the abdomen and pelvis was performed following the standard protocol without IV contrast. RADIATION DOSE REDUCTION: This exam was performed according to the departmental dose-optimization program which includes automated exposure control, adjustment of the mA and/or kV according to patient size and/or use of iterative reconstruction technique. COMPARISON:  CT 10/09/2023, 09/28/2023 FINDINGS: Lower chest: Lung bases demonstrate minimal atelectasis or scarring. No acute airspace disease. Differential blood cardiac pool suggesting anemia Hepatobiliary: Stable hepatic cysts. No calcified gallstone or biliary  dilatation. Pancreas: Unremarkable. No pancreatic ductal dilatation or surrounding inflammatory changes. Spleen: Normal in size without focal abnormality. Adrenals/Urinary Tract: Adrenal glands are within normal limits. Bilateral ureteral stent with proximal pigtails in the renal collecting system and distal pigtails in the posterior bladder. Small amount of air within the renal collecting systems and adjacent to the proximal left and mid bilateral ureteral stents. Small amount of air within the urinary bladder. Mild hydronephrosis probably similar compared to the most recent prior CT. Stomach/Bowel: Stomach nonenlarged. No dilated small bowel. No acute bowel wall thickening. Negative appendix. Vascular/Lymphatic: Aortic atherosclerosis. Extensive bulky retroperitoneal, pelvic, iliac and inguinal adenopathy corresponding to history of lymphoma. Reproductive: Clips at the prostate. Extensive scrotal edema and fluid. Other: Negative for free air. Small pelvic effusion. Extensive subcutaneous edema. Musculoskeletal: No acute osseous abnormality. Decreased hyperdense psoas muscle enlargement compared to most recent prior CT. IMPRESSION: 1. Bilateral ureteral stents in place. Similar degree of mild bilateral hydronephrosis. New small amount of air within the renal collecting systems and urinary bladder, correlate for recent instrumentation. If none recently, consider gas-forming infection with reflux of air into the renal collecting systems and ureters. 2. Extensive bulky retroperitoneal, pelvic, iliac and inguinal adenopathy corresponding to history of lymphoma. 3. Extensive subcutaneous edema and scrotal edema/fluid. Small pelvic effusion. 4. Decreased hyperdense psoas muscle enlargement compared to prior likely resolving intramuscular hematomas. 5. Aortic atherosclerosis. Aortic Atherosclerosis (ICD10-I70.0). Electronically Signed   By: Jasmine Pang M.D.   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

## 2023-10-20 NOTE — Progress Notes (Signed)
PROGRESS NOTE    Jerry Tran  MWN:[id number] DOB: 1946/09/27 DOA: 09/28/2023 PCP: [name]   Brief Narrative:  This 77 y.o. male with medical history significant of mantle cell lymphoma, CKD IIIA, diastolic CHF, A-fib, HLD presented with B/L leg swelling and groin swelling for the past 2 weeks and worsening shortness of breath. On presentation, BUN/creatinine were more than 120 and 3.0. CT of abdomen showed progressive adenopathy along with new bilateral hydroureteronephrosis secondary to compressive effect lower bladder thickening and anasarca. Oncology and urology were consulted. He underwent ureteric stent placement for extrinsic compression on 10/05/2023. On 10/06/2023, he was found to be more tachypneic: VQ scan was suggestive of 3 moderate-sized subsegmental perfusion defects compatible with PE. He was started on heparin drip. He subsequently had hematuria requiring CBI. On 10/09/2023, hemoglobin was found to be 4.1: He was transfused 3 units packed red cells. Heparin drip was stopped. Palliative care also consulted.    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   Hemoglobin was found to be 4.1 on 10/09/2023: Status post 4 units packed red cells transfusion.   Hemoglobin 9.0 this morning.  Monitor H&H daily. Patient reports having intermittent hematuria. CT A/P without contrast on 10/09/2023 suggested  possible intramuscular hemorrhage in the psoas muscles with bilateral ureteral stents with mild bilateral hydronephrosis. Heparin remains on hold.  Will follow-up with oncology regarding plan for anticoagulation.   At this time, patient/family want to stay off of anticoagulation.   Acute hypoxic respiratory failure: Improving Possible PE with bilateral acute lower extremity DVT. VQ scan : PE+,   Heparin plan as above. Currently on room air   AKI on CKD stage IIIa: Acute metabolic acidosis > Improved. Due to combination of obstructive uropathy and ischemic ATN from relative hypotension. Creatinine was improving, Nephrology signed off on 10/13/2023 and recommended to start Lasix 40 mg p.o. twice a day.  BP currently on the lower side.  Will start Lasix if blood pressure improves. Renal functions worsening again, nephrology reconsulted.   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 Status post ureteric stent placement on 10/05/2023.  Urology following. Urine culture grew 80,000 colonies per mL of Pseudomonas aeruginosa from 10/06/2023.   Completed 7 days of ciprofloxacin. Urology recommended placement of nephrostomy tubes and family agreeable if renal functions worsens. Patient tentatively scheduled for bilateral nephrostomy tubes today.  Hyponatremia: Improved. Continue to monitor   Hypokalemia: Replaced.  Continue to monitor.   Thrombocytopenia Due to mantle cell lymphoma.  Platelets improving to 68 today.  Monitor   Extensive retroperitoneal and pelvic lymphadenopathy Lymphedema Mantle cell lymphoma Goals of care Likely due to significant involvement of abdominal and pelvic lymph nodes by his mantle cell lymphoma. Oncology following. Patient is apparently not a candidate for further treatment at this time.  Currently on prednisone.  Oncology recommended hospice.   Palliative care following.  Patient wants to remain  full code and wants to continue current scope of treatment.  Hypotension: Start midodrine 5 mg 3 times daily.   DVT prophylaxis: SCDs Code Status: Full code Family Communication:Wife at bed side Disposition Plan:  Status is: Inpatient Remains inpatient appropriate because: Due to severity of illness.   Consultants:  Cardiology Urology Nephrology Palliative care  Procedures:  None Antimicrobials:  Anti-infectives (From admission, onward)    Start     Dose/Rate Route Frequency Ordered Stop   10/13/23 0800  ciprofloxacin (CIPRO) tablet 500 mg  Status:  Discontinued        500 mg Oral Daily with breakfast 10/12/23 1506 10/14/23 0929   10/12/23 2000  ciprofloxacin (CIPRO) tablet 500 mg  Status:  Discontinued        500 mg Oral 2 times daily 10/12/23 1505 10/12/23 1506   10/10/23 1430  ciprofloxacin (CIPRO) tablet 500 mg  Status:  Discontinued        500 mg Oral Daily 10/10/23 1332 10/12/23 1505   10/09/23 0800  ciprofloxacin (CIPRO) tablet 500 mg  Status:  Discontinued        500 mg Oral Daily with breakfast 10/08/23 1035 10/08/23 1049   10/08/23 1145  ciprofloxacin (CIPRO) tablet 500 mg        500 mg Oral Daily with breakfast 10/08/23 1049 10/11/23 0759   10/07/23 1800  amoxicillin-clavulanate (AUGMENTIN) 500-125 MG per tablet 1 tablet  Status:  Discontinued        1 tablet Oral Every 12 hours 10/07/23 1333 10/08/23 1035   10/06/23 1100  Ampicillin-Sulbactam (UNASYN) 3 g in sodium chloride 0.9 % 100 mL IVPB  Status:  Discontinued        3 g 200 mL/hr over 30 Minutes Intravenous Every 12 hours 10/06/23 0948 10/07/23 1333   10/05/23 1038  ceFAZolin (ANCEF) IVPB 2g/100 mL premix        2 g 200 mL/hr over 30 Minutes Intravenous 30 min pre-op 10/05/23 1038 10/05/23 1549      Subjective: Patient was seen and examined at bedside.  Overnight events noted. Patient reports feeling much better, hematuria has resolved.  Still having bilateral leg swelling. Renal functions are  declining, patient is scheduled for bilateral nephrostomy tubes today.  Objective:  Vitals:   10/19/23 1245 10/19/23 2028 10/20/23 0500 10/20/23 0612  BP: 116/79 102/68  (!) 90/59  Pulse: (!) 57 78  96  Resp: (!) 21 18  18   Temp:  (!) 97.4 F (36.3 C)  97.9 F (36.6 C)  TempSrc:  Oral  Oral  SpO2: 100% 100%  96%  Weight:   108.2 kg   Height:        Intake/Output Summary (Last 24 hours) at 10/20/2023 1040 Last data filed at 10/20/2023 0600 Gross per 24 hour  Intake 240 ml  Output 525 ml  Net -285 ml   Filed Weights   10/18/23 0628 10/19/23 0547 10/20/23 0500  Weight: 107 kg 106.5 kg 108.2 kg    Examination:  General exam: Appears comfortable, deconditioned, not in any acute distress. Respiratory system: CTA bilaterally.  Respiratory effort normal. RR 16 Cardiovascular system: S1 & S2 heard, RRR. No JVD, murmurs, rubs, gallops or clicks.  Gastrointestinal system: Abdomen is non distended, soft and non tender. Normal bowel sounds heard. Central nervous system: Alert and oriented X 3. No focal neurological deficits. Extremities:  Edema++, No cyanosis, No clubbing Skin: No rashes, lesions or ulcers Psychiatry: Judgement and insight appear normal. Mood & affect appropriate.  Data Reviewed: I have personally reviewed following labs and imaging studies  CBC: Recent Labs  Lab 10/15/23 0338 10/16/23 0329 10/18/23 0400 10/19/23 0333 10/20/23 0258  WBC 8.5 8.1 6.5 9.2 10.7*  NEUTROABS  --   --   --  7.7  --   HGB 8.9* 8.9* 9.0* 9.1* 8.6*  HCT 27.5* 27.6* 27.7* 27.4* 25.7*  MCV 90.5 89.6 89.4 89.3 88.3  PLT 74* 69* 72* 68* 60*   Basic Metabolic Panel: Recent Labs  Lab 10/14/23 0332 10/15/23 0338 10/16/23 0329 10/17/23 0312 10/18/23 0400 10/19/23 0333 10/20/23 0258  NA 136 136 136 133* 132* 133*  133* 132*  130*  K 3.0* 3.4* 4.1 4.3 4.1 4.3  4.3 4.5  4.3  CL 106 106 105 102 101 102  102 100  100  CO2 23 21* 22 21* 21* 19*  19* 19*  18*  GLUCOSE  103* 94 97 101* 90 96  97 100*  105*  BUN 48* 48* 52* 62* 76* 82*  82* 89*  89*  CREATININE 1.47* 1.63* 1.90* 2.49* 2.69* 2.41*  2.35* 3.10*  3.21*  CALCIUM 8.7* 8.8* 8.9 9.0 9.0 9.3  9.3 9.1  8.9  MG 2.0 1.9 1.9  --  2.1  --  2.3  PHOS 3.0  --  2.9  --  3.9 4.3 4.7*  5.0*   GFR: Estimated Creatinine Clearance: 26.9 mL/min (A) (by C-G formula based on SCr of 3.1 mg/dL (H)). Liver Function Tests: Recent Labs  Lab 10/14/23 0332 10/19/23 0333 10/20/23 0258  ALBUMIN 2.7* 3.2* 3.0*   No results for input(s): "LIPASE", "AMYLASE" in the last 168 hours. No results for input(s): "AMMONIA" in the last 168 hours. Coagulation Profile: Recent Labs  Lab 10/19/23 0333  INR 1.3*   Cardiac Enzymes: No results for input(s): "CKTOTAL", "CKMB", "CKMBINDEX", "TROPONINI" in the last 168 hours. BNP (last 3 results) No results for input(s): "PROBNP" in the last 8760 hours. HbA1C: No results for input(s): "HGBA1C" in the last 72 hours. CBG: Recent Labs  Lab 10/17/23 0009  GLUCAP 110*   Lipid Profile: No results for input(s): "CHOL", "HDL", "LDLCALC", "TRIG", "CHOLHDL", "LDLDIRECT" in the last 72 hours. Thyroid Function Tests: No results for input(s): "TSH", "T4TOTAL", "FREET4", "T3FREE", "THYROIDAB" in the last 72 hours. Anemia Panel: No results for input(s): "VITAMINB12", "FOLATE", "FERRITIN", "TIBC", "IRON", "RETICCTPCT" in the last 72 hours. Sepsis Labs: No results for input(s): "PROCALCITON", "LATICACIDVEN" in the last 168 hours.  Recent Results (from the past 240 hours)  Surgical pcr screen     Status: None   Collection Time: 10/19/23  2:02 AM   Specimen: Nasal Mucosa; Nasal Swab  Result Value Ref Range Status   MRSA, PCR NEGATIVE NEGATIVE Final   Staphylococcus aureus NEGATIVE NEGATIVE Final    Comment: (NOTE) The Xpert SA Assay (FDA approved for NASAL specimens in patients 59 years of age and older), is one component of a comprehensive surveillance program. It is not  intended to diagnose infection nor to guide or monitor treatment. Performed at [facility], [address], [place], [place] [address]      Radiology Studies: CT ABDOMEN PELVIS WO CONTRAST Result Date: 10/18/2023 CLINICAL DATA:  Kidney failure stenting EXAM: CT ABDOMEN AND PELVIS WITHOUT CONTRAST TECHNIQUE: Multidetector CT imaging of the abdomen and pelvis was performed following the standard protocol without IV contrast. RADIATION DOSE REDUCTION: This exam was performed according to the departmental dose-optimization program which includes automated exposure control, adjustment of the mA  and/or kV according to patient size and/or use of iterative reconstruction technique. COMPARISON:  CT 10/09/2023, 09/28/2023 FINDINGS: Lower chest: Lung bases demonstrate minimal atelectasis or scarring. No acute airspace disease. Differential blood cardiac pool suggesting anemia Hepatobiliary: Stable hepatic cysts. No calcified gallstone or biliary dilatation. Pancreas: Unremarkable. No pancreatic ductal dilatation or surrounding inflammatory changes. Spleen: Normal in size without focal abnormality. Adrenals/Urinary Tract: Adrenal glands are within normal limits. Bilateral ureteral stent with proximal pigtails in the renal collecting system and distal pigtails in the posterior bladder. Small amount of air within the renal collecting systems and adjacent to the proximal left and mid bilateral ureteral stents. Small amount of air within the urinary bladder. Mild hydronephrosis probably similar compared to the most recent prior CT. Stomach/Bowel: Stomach nonenlarged. No dilated small bowel. No acute bowel wall thickening. Negative appendix. Vascular/Lymphatic: Aortic atherosclerosis. Extensive bulky retroperitoneal, pelvic, iliac and inguinal adenopathy corresponding to history of lymphoma. Reproductive: Clips at the prostate. Extensive scrotal edema and fluid. Other: Negative for free air. Small  pelvic effusion. Extensive subcutaneous edema. Musculoskeletal: No acute osseous abnormality. Decreased hyperdense psoas muscle enlargement compared to most recent prior CT. IMPRESSION: 1. Bilateral ureteral stents in place. Similar degree of mild bilateral hydronephrosis. New small amount of air within the renal collecting systems and urinary bladder, correlate for recent instrumentation. If none recently, consider gas-forming infection with reflux of air into the renal collecting systems and ureters. 2. Extensive bulky retroperitoneal, pelvic, iliac and inguinal adenopathy corresponding to history of lymphoma. 3. Extensive subcutaneous edema and scrotal edema/fluid. Small pelvic effusion. 4. Decreased hyperdense psoas muscle enlargement compared to prior likely resolving intramuscular hematomas. 5. Aortic atherosclerosis. Aortic Atherosclerosis (ICD10-I70.0). Electronically Signed   By: Jasmine Pang M.D.   On: 10/18/2023 19:06    Scheduled Meds:  atorvastatin  10 mg Oral QHS   Chlorhexidine Gluconate Cloth  6 each Topical Daily   docusate sodium  100 mg Oral BID   feeding supplement  237 mL Oral BID BM   liver oil-zinc oxide   Topical BID   midodrine  5 mg Oral TID WC   multivitamin with minerals  1 tablet Oral Daily   oxybutynin  10 mg Oral QHS   pantoprazole  40 mg Oral Daily   predniSONE  40 mg Oral Q breakfast   senna  1 tablet Oral BID   sodium bicarbonate  650 mg Oral BID   sodium chloride flush  3 mL Intravenous Q12H   Continuous Infusions:   LOS: 22 days    Time spent: 35 mins    Willeen Niece, MD [facility]   If 7PM-7AM, please contact night-coverage

## 2023-10-20 NOTE — Consult Note (Signed)
Chief Complaint: Patient was seen in consultation today for bilateral hydronephrosis   Referring Physician(s): Bilateral hydronephrosis  Supervising Physician: Mir, Mauri Reading  Patient Status: [facility] - In-pt  History of Present Illness: Jerry Tran is a 77 y.o. male with a past medical history significant for PTSD, GERD, HLD. HTN, paroxysmal a.fib, CHF, CKD III, prostate cancer s/p radiation and metastatic mantle cell lymphoma who presented to the ED on 09/28/23 with bilateral lower extremity and groin swelling as well as dyspnea. Imaging showed progressive bulky lymphadenopathy with mild bilateral hydronephrosis secondary to extrinsic compression from the lymphadenopathy. He was admitted and underwent bilateral ureteral stent placement 10/05/23 with urology. He developed a PE and was started on heparin gtt however he developed a retroperitoneal bleed and the heparin was discontinued. His serum creatinine has continued to rise despite minimally worsened hydronephrosis which indicates the ureteral stents are likely patent and working appropriately. He had a foley catheter placed with no improvement in his renal function and this has since been removed. IR has been consulted for bilateral nephrostomy placement for bilateral hydronephrosis.  Patient seen in his room, he is aware of the procedure but wants his wife to be present during discussion - he does not want to have her listen in on the phone. Helped patient to the bathroom while awaiting his wife's arrival, noted to have significant BLE and scrotal edema during transfer. Hard for him to move independently in bed and needs assistance to rise to walker but once has walker can ambulate to bathroom. Discussed procedure with patient's wife and his sister who later came to bedside while patient was in the restroom. They were initially under the impression that the IR procedure would affect his existing ureteral stents and were not inclined to proceed until  they spoke with Dr. Alvester Morin specifically, however he is on vacation and I discussed with them that this was something he had recommended previously and his partner today has also recommended. They are also asking if he would need to go on dialysis and I explained that would be something nephrology could discuss with them further. After extensive discussion with patient and several family members regarding anatomy, location of ureteral stents and planned location of PCNs they wish to contact several other family members to discuss the proposed procedure. After about 30 minutes at bedside answering their questions and concerns, everyone is in agreement to proceed after the patient has some time to clean himself and use the restroom again.  Past Medical History:  Diagnosis Date   Arthritis    Chronic kidney disease, stage 3b (HCC) 12/09/2021   Dyslipidemia    GERD (gastroesophageal reflux disease)    Hypertension    PAF (paroxysmal atrial fibrillation) (HCC)    Prostate cancer (HCC) 03/2021   s/p radiation therapy   PTSD (post-traumatic stress disorder)     Past Surgical History:  Procedure Laterality Date   CYSTOSCOPY W/ RETROGRADES Bilateral 10/05/2023   Procedure: CYSTOSCOPY WITH BILATERAL RETROGRADE PYELOGRAM and BILATERAL STENT PLACEMENT;  Surgeon: Crista Elliot, MD;  Location: [facility];  Service: Urology;  Laterality: Bilateral;   IR IMAGING GUIDED PORT INSERTION  11/06/2022   LIPOMA EXCISION Right 12/09/2021   Procedure: RIGHT SUPERCLAVICULAR LYMPH NODE EXCISION;  Surgeon: Andria Meuse, MD;  Location: [facility] OR;  Service: General;  Laterality: Right;    Allergies: Sildenafil  Medications: Prior to Admission medications   Medication Sig Start Date End Date Taking? Authorizing Provider  acyclovir (ZOVIRAX) 400 MG tablet TAKE  1 TABLET(400 MG) BY MOUTH DAILY 09/04/23  Yes [name], MD  ammonium lactate (LAC-HYDRIN) 12 % lotion Apply 1 application topically 2 (two) times daily  as needed for dry skin.   Yes [provider]  atorvastatin (LIPITOR) 20 MG tablet Take 10 mg by mouth at bedtime.   Yes [provider]  Cholecalciferol (VITAMIN D3 PO) Take 1 tablet by mouth every morning.   Yes [provider]  diclofenac Sodium (VOLTAREN) 1 % GEL Apply 2 g topically 4 (four) times daily as needed (pain).   Yes [provider]  fluticasone (FLONASE) 50 MCG/ACT nasal spray Place 1 spray into both nostrils daily as needed for allergies or rhinitis.   Yes [provider]  furosemide (LASIX) 20 MG tablet Take 40 mg by mouth daily.   Yes [provider]  hydroxypropyl methylcellulose / hypromellose (ISOPTO TEARS / GONIOVISC) 2.5 % ophthalmic solution Place 2 drops into both eyes 2 (two) times daily as needed for dry eyes.   Yes [provider]  LORazepam (ATIVAN) 0.5 MG tablet Take 1 tablet (0.5 mg total) by mouth every 6 (six) hours as needed (Nausea or vomiting). 12/16/21  Yes [name], MD  magic mouthwash (multi-ingredient) oral suspension Take 5 mLs by mouth 4 times daily as needed for mouth and throat pain. 04/26/23  Yes [name], MD  magic mouthwash w/lidocaine SOLN Take 5 mLs by mouth 4 (four) times daily as needed for mouth pain (mouth and throat pain related to radiation). Compound 400 ml total volume with  80ml of Maalox 80ml of Nystatin 100,000 unit/ml 80ml of 2% viscous lidocaine 80ml of Diphenhydramine 12.5mg /70ml 80ml of Distilled water. 04/26/23  Yes [name], MD  metoprolol tartrate (LOPRESSOR) 25 MG tablet Take 12.5 mg by mouth 2 (two) times daily. Take with flecainide   Yes [provider]  mirtazapine (REMERON) 30 MG tablet Take 30 mg by mouth at bedtime. 01/31/22  Yes [provider]  omeprazole (PRILOSEC) 20 MG capsule Take 20 mg by mouth daily before breakfast.   Yes [provider]  pirtobrutinib (JAYPIRCA) 100 MG tablet Take 2 tablets (200 mg  total) by mouth daily. 09/28/23  Yes [name], MD  Potassium Chloride ER 20 MEQ TBCR Take 1 tablet by mouth 2 (two) times daily.   Yes [provider]  potassium chloride SA (KLOR-CON M) 20 MEQ tablet TAKE 1 TABLET(20 MEQ) BY MOUTH TWICE DAILY 03/01/23  Yes [name], MD  prazosin (MINIPRESS) 2 MG capsule Take 6 mg by mouth at bedtime.   Yes [provider]  terbinafine (LAMISIL) 1 % cream Apply 1 application topically 2 (two) times daily as needed (athlete's foot).   Yes [provider]  triamcinolone cream (KENALOG) 0.1 % Apply 1 application topically 2 (two) times daily as needed (rash).   Yes [provider]  allopurinol (ZYLOPRIM) 100 MG tablet Take 1 tablet (100 mg total) by mouth daily. 10/02/23   [name], MD  dexamethasone (DECADRON) 4 MG tablet Take 2 tablets (8 mg total) by mouth daily. Start the day after chemotherapy for 2 days. Take with food. 10/02/23   [name], MD  feeding supplement (ENSURE ENLIVE / ENSURE PLUS) LIQD Take 237 mLs by mouth 2 (two) times daily between meals. Patient not taking: Reported on 09/28/2023 12/11/21   Hartley Barefoot A, MD  flecainide (TAMBOCOR) 100 MG tablet Take 100 mg by mouth every 12 (twelve) hours.  Patient not taking: Reported on 09/28/2023    [provider]  hydrocortisone (ANUSOL-HC) 25 MG suppository Place 1 suppository (25 mg total) rectally 2 (two) times daily. Patient not taking: Reported on 09/28/2023 01/30/23   Georga Kaufmann T, PA-C  lidocaine-prilocaine (EMLA) cream Apply to affected area once 10/02/23   [name], MD  Nutritional Supplements (ENSURE COMPLETE PO) Take 1 Container by mouth 2 (two) times daily. Patient not taking: Reported on 09/28/2023 07/26/23   [provider]  nystatin (MYCOSTATIN) 100000 UNIT/ML suspension Take 5 mLs (500,000 Units total) by mouth 4 (four) times daily. Patient not taking: Reported on 09/28/2023 03/23/23    [name], MD  ondansetron (ZOFRAN) 8 MG tablet Take 1 tablet (8 mg total) by mouth every 8 (eight) hours as needed for nausea or vomiting. Start on the third day after chemotherapy. 10/02/23   [name], MD  prochlorperazine (COMPAZINE) 10 MG tablet Take 1 tablet (10 mg total) by mouth every 6 (six) hours as needed for nausea or vomiting. 10/02/23   [name], MD  sucralfate (CARAFATE) 1 GM/10ML suspension Take 10 mLs by mouth 4 times daily -  with meals and at bedtime. Patient not taking: Reported on 09/28/2023 05/14/23   Shanon Ace, PA-C  torsemide (DEMADEX) 10 MG tablet Take 20 mg by mouth daily.    [provider]     Family History  Problem Relation Age of Onset   Cancer Neg Hx     Social History   Socioeconomic History   Marital status: Married    Spouse name: Not on file   Number of children: Not on file   Years of education: Not on file   Highest education level: Not on file  Occupational History   Occupation: retired  Tobacco Use   Smoking status: Former    Current packs/day: 1.00    Average packs/day: 1 pack/day for 15.0 years (15.0 ttl pk-yrs)    Types: Cigarettes   Smokeless tobacco: Never  Substance and Sexual Activity   Alcohol use: Not Currently    Comment: h/o heavy use   Drug use: Not Currently    Types: Marijuana, Cocaine    Comment: remote   Sexual activity: Not on file  Other Topics Concern   Not on file  Social History Narrative   Not on file   Social Drivers of Health   Financial Resource Strain: Not on file  Food Insecurity: No Food Insecurity (09/29/2023)   Hunger Vital Sign    Worried About Running Out of Food in the Last Year: Never true    Ran Out of Food in the Last Year: Never true  Transportation Needs: No Transportation Needs (09/29/2023)   PRAPARE - Administrator, Civil Service (Medical): No    Lack of Transportation (Non-Medical): No  Physical Activity: Not on file   Stress: Not on file  Social Connections: Not on file     Review of Systems: A 12 point ROS discussed and pertinent positives are indicated in the HPI above.  All other systems are negative.  Review of Systems  Constitutional:  Positive for fatigue. Negative for chills and fever.  Respiratory:  Positive for shortness of breath. Negative for cough.   Cardiovascular:  Positive for palpitations ("sometimes, not right now") and leg swelling. Negative for chest pain.  Gastrointestinal:  Negative for abdominal distention, abdominal pain, diarrhea, nausea and vomiting.  Genitourinary:  Positive for scrotal swelling.  Musculoskeletal:  Negative for back pain.  Neurological:  Positive for weakness. Negative for dizziness and headaches.  Psychiatric/Behavioral:  Negative for confusion.     Vital Signs: BP (!) 90/59 (BP Location: Right Arm)   Pulse 96   Temp 97.9 F (36.6 C) (Oral)   Resp 18   Ht 6\' 4"  (1.93 m)   Wt 238 lb 8.6 oz (108.2 kg)   SpO2 96%   BMI 29.04 kg/m   Physical Exam Vitals reviewed.  Constitutional:      General: He is not in acute distress.    Appearance: He is ill-appearing.  HENT:     Head: Normocephalic.     Mouth/Throat:     Mouth: Mucous membranes are moist.     Pharynx: Oropharynx is clear. No oropharyngeal exudate or posterior oropharyngeal erythema.  Cardiovascular:     Rate and Rhythm: Normal rate and regular rhythm.  Pulmonary:     Effort: Pulmonary effort is normal.     Breath sounds: Normal breath sounds.  Abdominal:     General: There is no distension.     Palpations: Abdomen is soft.     Tenderness: There is no abdominal tenderness.  Musculoskeletal:     Right lower leg: Edema present.     Left lower leg: Edema present.  Skin:    General: Skin is warm and dry.  Neurological:     Mental Status: He is alert and oriented to person, place, and time.  Psychiatric:        Mood and Affect: Mood normal.        Behavior: Behavior normal.         Thought Content: Thought content normal.        Judgment: Judgment normal.      MD Evaluation Airway: WNL Heart: WNL Abdomen: WNL Chest/ Lungs: WNL ASA  Classification: 3 Mallampati/Airway Score: Two   Imaging: CT ABDOMEN PELVIS WO CONTRAST Result Date: 10/18/2023 CLINICAL DATA:  Kidney failure stenting EXAM: CT ABDOMEN AND PELVIS WITHOUT CONTRAST TECHNIQUE: Multidetector CT imaging of the abdomen and pelvis was performed following the standard protocol without IV contrast. RADIATION DOSE REDUCTION: This exam was performed according to the departmental dose-optimization program which includes automated exposure control, adjustment of the mA and/or kV according to patient size and/or use of iterative reconstruction technique. COMPARISON:  CT 10/09/2023, 09/28/2023 FINDINGS: Lower chest: Lung bases demonstrate minimal atelectasis or scarring. No acute airspace disease. Differential blood cardiac pool suggesting anemia Hepatobiliary: Stable hepatic cysts. No calcified gallstone or biliary dilatation. Pancreas: Unremarkable. No pancreatic ductal dilatation or surrounding inflammatory changes. Spleen: Normal in size without focal abnormality. Adrenals/Urinary Tract: Adrenal glands are within normal limits. Bilateral ureteral stent with proximal pigtails in the renal collecting system and distal pigtails in the posterior bladder. Small amount of air within the renal collecting systems and adjacent to the proximal left and mid bilateral ureteral stents. Small amount of air within the urinary bladder. Mild hydronephrosis probably similar compared to the most recent prior CT. Stomach/Bowel: Stomach nonenlarged. No dilated small bowel. No acute bowel wall thickening. Negative appendix. Vascular/Lymphatic: Aortic atherosclerosis. Extensive bulky retroperitoneal, pelvic, iliac and inguinal adenopathy corresponding to history of lymphoma. Reproductive: Clips at the prostate. Extensive scrotal edema and fluid.  Other: Negative for free air. Small pelvic effusion. Extensive subcutaneous edema. Musculoskeletal: No acute osseous abnormality. Decreased hyperdense psoas muscle enlargement compared to most recent prior CT. IMPRESSION: 1. Bilateral ureteral stents in place. Similar degree of mild bilateral hydronephrosis. New small amount of air  within the renal collecting systems and urinary bladder, correlate for recent instrumentation. If none recently, consider gas-forming infection with reflux of air into the renal collecting systems and ureters. 2. Extensive bulky retroperitoneal, pelvic, iliac and inguinal adenopathy corresponding to history of lymphoma. 3. Extensive subcutaneous edema and scrotal edema/fluid. Small pelvic effusion. 4. Decreased hyperdense psoas muscle enlargement compared to prior likely resolving intramuscular hematomas. 5. Aortic atherosclerosis. Aortic Atherosclerosis (ICD10-I70.0). Electronically Signed   By: Jasmine Pang M.D.   On: 10/18/2023 19:06   CT ABDOMEN PELVIS WO CONTRAST Result Date: 10/09/2023 CLINICAL DATA:  Retroperitoneal bleed suspected.  Lymphoma. EXAM: CT ABDOMEN AND PELVIS WITHOUT CONTRAST TECHNIQUE: Multidetector CT imaging of the abdomen and pelvis was performed following the standard protocol without IV contrast. RADIATION DOSE REDUCTION: This exam was performed according to the departmental dose-optimization program which includes automated exposure control, adjustment of the mA and/or kV according to patient size and/or use of iterative reconstruction technique. COMPARISON:  CT abdomen pelvis dated 09/28/2023. FINDINGS: Evaluation of this exam is limited in the absence of intravenous contrast. Lower chest: Partially visualized small bilateral pleural effusions with partial compressive atelectasis of the lower lobes. Pneumonia is not excluded. The tip of a central venous line noted at the cavoatrial junction. There is hypoattenuation of the cardiac blood pool suggestive of  anemia. Clinical correlation is recommended. No intra-abdominal free air.  Small ascites. Hepatobiliary: Several small liver cysts. No biliary dilatation. No calcified gallstone. Pancreas: Unremarkable. No pancreatic ductal dilatation or surrounding inflammatory changes. Spleen: Normal in size without focal abnormality. Adrenals/Urinary Tract: Bilateral pigtail ureteral stents noted with proximal tips in the renal pelvis and distal end in the urinary bladder. There is mild bilateral hydronephrosis similar to prior CT. No stone noted. The urinary bladder is decompressed around a Foley catheter. Stomach/Bowel: Small hiatal hernia. Mildly thickened small bowel loops, likely related to ascites and anasarca. Enteritis is less likely. There is no bowel obstruction. The appendix is normal. Vascular/Lymphatic: Mild aortoiliac atherosclerotic disease. The IVC is unremarkable. No portal venous gas. Bulky retroperitoneal and bilateral pelvic sidewall adenopathy. Right inguinal adenopathy as seen previously. Reproductive: Several biopsy clips in the prostate gland. Other: There is diffuse subcutaneous edema and anasarca, significantly progressed since the prior CT. Enlargement of the psoas muscles with ill-defined higher attenuation suspicious for intramuscular hemorrhage. Musculoskeletal: Osteopenia with degenerative changes of the spine. No acute osseous pathology. IMPRESSION: 1. Enlargement of the psoas muscles suspicious for intramuscular hemorrhage. 2. Bulky retroperitoneal and bilateral pelvic sidewall adenopathy. 3. Bilateral pigtail ureteral stents with mild bilateral hydronephrosis similar to prior CT. 4. Small ascites. Diffuse subcutaneous edema and anasarca, significantly progressed since the prior CT. 5. Partially visualized small bilateral pleural effusions with partial compressive atelectasis of the lower lobes. Pneumonia is not excluded. 6.  Aortic Atherosclerosis (ICD10-I70.0). Electronically Signed   By: Elgie Collard M.D.   On: 10/09/2023 20:26   VAS [place] LOWER EXTREMITY VENOUS (DVT) Result Date: 10/07/2023  Lower Venous DVT Study Patient Name:  Jerry Tran  Date of Exam:   10/06/2023 Medical Rec #: [id number]     Accession #:    [id number] Date of Birth: 07-08-1946     Patient Gender: M Patient Age:   24 years Exam Location:  [facility] Procedure:      VAS [place] LOWER EXTREMITY VENOUS (DVT) Referring Phys: Rhetta Mura --------------------------------------------------------------------------------  Indications: Swelling, and pulmonary embolism.  Risk Factors: Cancer. Limitations: Body habitus and poor ultrasound/tissue interface. Comparison Study: No prior studies. Performing Technologist:  Chanda Busing RVT  Examination Guidelines: A complete evaluation includes B-mode imaging, spectral Doppler, color Doppler, and power Doppler as needed of all accessible portions of each vessel. Bilateral testing is considered an integral part of a complete examination. Limited examinations for reoccurring indications may be performed as noted. The reflux portion of the exam is performed with the patient in reverse Trendelenburg.  +---------+---------------+---------+-----------+----------+-------------------+ RIGHT    CompressibilityPhasicitySpontaneityPropertiesThrombus Aging      +---------+---------------+---------+-----------+----------+-------------------+ CFV      Partial        Yes      Yes                  Acute               +---------+---------------+---------+-----------+----------+-------------------+ FV Prox  None           No       No                   Acute               +---------+---------------+---------+-----------+----------+-------------------+ FV Mid   None           No       No                   Acute               +---------+---------------+---------+-----------+----------+-------------------+ FV DistalNone           No       No                   Acute                +---------+---------------+---------+-----------+----------+-------------------+ PFV                                                   Not well visualized +---------+---------------+---------+-----------+----------+-------------------+ POP      Full           Yes      Yes                                      +---------+---------------+---------+-----------+----------+-------------------+ PTV      Full                                                             +---------+---------------+---------+-----------+----------+-------------------+ PERO     Full                                                             +---------+---------------+---------+-----------+----------+-------------------+ EIV                     Yes      Yes                                      +---------+---------------+---------+-----------+----------+-------------------+  The EIV appears patent.  +---------+---------------+---------+-----------+----------+--------------+ LEFT     CompressibilityPhasicitySpontaneityPropertiesThrombus Aging +---------+---------------+---------+-----------+----------+--------------+ CFV      Full           Yes      Yes                                 +---------+---------------+---------+-----------+----------+--------------+ SFJ      Full                                                        +---------+---------------+---------+-----------+----------+--------------+ FV Prox  Full                                                        +---------+---------------+---------+-----------+----------+--------------+ FV Mid   Full                                                        +---------+---------------+---------+-----------+----------+--------------+ FV DistalFull                                                        +---------+---------------+---------+-----------+----------+--------------+ PFV      Full                                                         +---------+---------------+---------+-----------+----------+--------------+ POP      None           No       No                   Acute          +---------+---------------+---------+-----------+----------+--------------+ PTV      Full                                                        +---------+---------------+---------+-----------+----------+--------------+ PERO     Full                                                        +---------+---------------+---------+-----------+----------+--------------+ Gastroc  Full                                                        +---------+---------------+---------+-----------+----------+--------------+  Summary: RIGHT: - Findings consistent with acute deep vein thrombosis involving the right common femoral vein, and right femoral vein.  - No cystic structure found in the popliteal fossa.  LEFT: - Findings consistent with acute deep vein thrombosis involving the left popliteal vein.  - No cystic structure found in the popliteal fossa.  *See table(s) above for measurements and observations. Electronically signed by Carolynn Sayers on 10/07/2023 at 8:57:49 AM.    Final    NM Pulmonary Perfusion Result Date: 10/06/2023 CLINICAL DATA:  Aggressive shortness of breath. Left groin swelling. Severe acute kidney injury. Mantle cell lymphoma. EXAM: NUCLEAR MEDICINE PERFUSION LUNG SCAN TECHNIQUE: Perfusion images were obtained in multiple projections after intravenous injection of radiopharmaceutical. Ventilation scans intentionally deferred if perfusion scan and chest x-ray adequate for interpretation during COVID 19 epidemic. RADIOPHARMACEUTICALS:  3.95 mCi Tc-58m MAA IV COMPARISON:  Portable chest obtained earlier today. FINDINGS: Moderate-sized subsegmental perfusion defects in the anteromedial left upper lobe, lateral right lower lobe and anteromedial right middle lobe. No corresponding abnormality at these  locations on the chest radiograph. IMPRESSION: Three moderate-sized subsegmental perfusion defects with no corresponding radiographic abnormality, compatible with pulmonary embolism. Electronically Signed   By: Beckie Salts M.D.   On: 10/06/2023 14:47   US RENAL Result Date: 10/06/2023 CLINICAL DATA:  "Hydronephrosis".  Lymphoma. EXAM: RENAL / URINARY TRACT ULTRASOUND COMPLETE COMPARISON:  CT 09/28/2023 FINDINGS: Right Kidney: Renal measurements: 10.1 x 4.8 x 6.0 cm = volume: 150 mL. Normal echogenicity. Mild hydronephrosis. Left Kidney: Renal measurements: 12.0 x 5.4 x 5.7 cm = volume: 182 mL. Normal echogenicity. Mild hydronephrosis. Bladder: Foley catheter within. Other: None. IMPRESSION: Similar mild bilateral hydronephrosis. Electronically Signed   By: Jeronimo Greaves M.D.   On: 10/06/2023 09:31   DG CHEST PORT 1 VIEW Result Date: 10/06/2023 CLINICAL DATA:  Pneumonia and shortness of breath EXAM: PORTABLE CHEST 1 VIEW COMPARISON:  09/28/2023 FINDINGS: Right Port-A-Cath tip at superior caval/atrial junction. Midline trachea. Patient rotated minimally right. Borderline cardiomegaly. Tortuous thoracic aorta. No pleural effusion or pneumothorax. Mild/subtle density projecting along the left heart border and lateral left hemidiaphragm. Right lung clear. IMPRESSION: Subtle density projecting along the left heart border and left hemidiaphragm. Favored to represent interval atelectasis. Given the clinical history, early or resolving pneumonia could look similar. Depending on clinical concern, consider radiographic follow-up at 5-7 days. Electronically Signed   By: Jeronimo Greaves M.D.   On: 10/06/2023 09:25   DG C-Arm 1-60 Min-No Report Result Date: 10/05/2023 Fluoroscopy was utilized by the requesting physician.  No radiographic interpretation.   DG OR UROLOGY CYSTO IMAGE ([facility] ONLY) Result Date: 10/05/2023 There is no interpretation for this exam.  This order is for images obtained during a surgical procedure.   Please See "Surgeries" Tab for more information regarding the procedure.   CT ABDOMEN PELVIS WO CONTRAST Result Date: 09/28/2023 CLINICAL DATA:  Lower extremity and groin swelling for 2 weeks with shortness of breath. Most recent PET-CT with extensive bulky abdominopelvic adenopathy. Currently undergoing XRT for lymphoma. EXAM: CT ABDOMEN AND PELVIS WITHOUT CONTRAST TECHNIQUE: Multidetector CT imaging of the abdomen and pelvis was performed following the standard protocol without IV contrast. RADIATION DOSE REDUCTION: This exam was performed according to the departmental dose-optimization program which includes automated exposure control, adjustment of the mA and/or kV according to patient size and/or use of iterative reconstruction technique. COMPARISON:  PET-CT 08/03/2023, CT chest, abdomen and pelvis with IV contrast 05/29/2023. FINDINGS: Lower chest: No acute abnormality. No lung nodules. The cardiac  size is normal. The coronary blood pool is less dense than the myocardium consistent with anemia. No pericardial effusion. Hepatobiliary: There are several small scattered hepatic cysts, with no follow-up imaging required. No new liver abnormality is seen without contrast. The gallbladder and bile ducts are unremarkable. Pancreas: Unremarkable without contrast. Spleen: Unremarkable without contrast.  No splenomegaly. Adrenals/Urinary Tract: There is no adrenal mass. No contour deforming abnormality of either unenhanced kidneys. Interval new mild bilateral hydroureteronephrosis. There is no urinary stone. This is probably due to compressive effect related to extensive progressive bilateral retroperitoneal adenopathy. The bladder is not fully distended but does appear increasingly thickened, which could be due to cystitis, nondistention, or XRT. Stomach/Bowel: No dilatation or wall thickening is seen without contrast. There is mild-to-moderate fecal stasis. Diverticulosis is noted without focal colitis or  diverticulitis. The appendix is normal. Vascular/Lymphatic: Mild aortic atherosclerosis. No AAA. Extensive progressive retroperitoneal adenopathy. There are multilevel bulky retroperitoneal lymph nodes. No adenopathy was previously seen along the aorta and IVC This is suboptimally evaluated without contrast. Example lymph nodes are as follows: New retrocaval lymph node measuring 2.7 x 1.9 cm on 2:27; New aortocaval lymph node measuring 3.2 x 3.4 cm on 2:30; Numerous other new lymph nodes around the between the IVC and aorta are also noted. There are multiple new left periaortic chain nodes, largest of these is 5.4 x 3.2 cm on 2:36. There is extensive progressive adenopathy in the pelvis. Examples include: A right common iliac chain node measuring 4.9 x 3.9 cm on 2:46, was previously 3.3 x 2.0 cm; Bulky pelvic sidewall nodes with additional progressive lymph nodes in the more distal common iliac chains, for example a left common iliac chain lymph node is estimated 4.9 x 3.5 cm on 2:53, was previously 2.1 x 1.8 cm; There are bulky bilateral external iliac chain nodes. On the right the largest is 6.1 x 4.6 cm on 2:65, previously 6 x 4.5 cm; Largest of the left external iliac chain nodes is 6.2 x 4.9 cm on 2:61, previously was 5 x 3.7 cm. Bulky bilateral inguinal adenopathy is again noted, largest slightly improved on the right, now 5.6 x 4.0 cm on 2:88, previously was 5.7 x 4.6 cm. Largest left inguinal chain node is less dense than previously possibly due to XRT, today measuring 3.9 x 3.0 cm on 2:84, was previously 5.7 x 4.8 cm. Lymph nodes in medial upper thighs there also slightly improved, on the left measuring 3.6 x 3.0 cm on 2:110, previously 4.0 x 3.6 cm. At the same level on the right, a medial upper thigh lymph node measures 3.0 x 2.6 cm on 2:105, was previously 5.7 x 4.4 cm. 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 The prostate is not enlarged. Other: Minimal pelvic  ascites. Diffuse worsening body wall anasarca which could be due to malnutrition, hepatic dysfunction, or fluid overload. There is severe penoscrotal edema, small hydroceles. Mild diffuse haziness to the mesentery is also noted but without focality. Musculoskeletal: Advanced degenerative change lumbar spine. Mild hip DJD. No metastatic bone lesion is seen. Ankylosis right SI joint. IMPRESSION: 1. Extensive progressive retroperitoneal and pelvic adenopathy, with some slightly improved inguinal and medial upper thigh adenopathy. 2. Interval new mild bilateral hydroureteronephrosis, probably due to compressive effect from new extensive retroperitoneal adenopathy. 3. Increasing bladder wall thickening which could be due to cystitis, nondistention, or XRT. 4. Constipation and diverticulosis. 5. Minimal pelvic ascites. 6. Worsening body wall anasarca which could be due to malnutrition,  hepatic dysfunction, or fluid overload. 7. Severe penoscrotal edema, small hydroceles. 8. Aortic atherosclerosis. Aortic Atherosclerosis (ICD10-I70.0). Electronically Signed   By: Almira Bar M.D.   On: 09/28/2023 21:55   DG Chest Port 1 View Result Date: 09/28/2023 CLINICAL DATA:  Shortness of breath with history of lymphoma. Receiving XRT for lymphoma. EXAM: PORTABLE CHEST 1 VIEW COMPARISON:  Chest CT with contrast 05/29/2023 FINDINGS: Right IJ port catheter again terminates at about the superior cavoatrial junction. A skin fold overlies the outer right upper lung field simulating a pneumothorax. No pneumothorax is seen. The lungs are clear. The sulci are sharp. There is mild cardiomegaly. No vascular congestion is seen. Stable mediastinum with aortic tortuosity and atherosclerosis. DJD both shoulders with no new osseous findings. IMPRESSION: 1. No evidence of acute chest disease. 2. Mild cardiomegaly. 3. Aortic atherosclerosis. 4. Right IJ port catheter. Electronically Signed   By: Almira Bar M.D.   On: 09/28/2023 21:17     Labs:  CBC: Recent Labs    10/16/23 0329 10/18/23 0400 10/19/23 0333 10/20/23 0258  WBC 8.1 6.5 9.2 10.7*  HGB 8.9* 9.0* 9.1* 8.6*  HCT 27.6* 27.7* 27.4* 25.7*  PLT 69* 72* 68* 60*    COAGS: Recent Labs    09/28/23 2035 10/19/23 0333  INR 1.1 1.3*    BMP: Recent Labs    10/17/23 0312 10/18/23 0400 10/19/23 0333 10/20/23 0258  NA 133* 132* 133*  133* 132*  130*  K 4.3 4.1 4.3  4.3 4.5  4.3  CL 102 101 102  102 100  100  CO2 21* 21* 19*  19* 19*  18*  GLUCOSE 101* 90 96  97 100*  105*  BUN 62* 76* 82*  82* 89*  89*  CALCIUM 9.0 9.0 9.3  9.3 9.1  8.9  CREATININE 2.49* 2.69* 2.41*  2.35* 3.10*  3.21*  GFRNONAA 26* 24* 27*  28* 20*  19*    LIVER FUNCTION TESTS: Recent Labs    09/07/23 1429 09/26/23 1529 09/28/23 2035 09/29/23 0845 10/07/23 0113 10/13/23 0220 10/14/23 0332 10/19/23 0333 10/20/23 0258  BILITOT 0.6 0.4 0.4 0.6  --   --   --   --   --   AST 26 38 39 35  --   --   --   --   --   ALT 13 16 15 14   --   --   --   --   --   ALKPHOS 64 68 63 57  --   --   --   --   --   PROT 6.9 7.2 6.8 6.6  --   --   --   --   --   ALBUMIN 3.6 4.0 3.7 3.6   < > 3.2* 2.7* 3.2* 3.0*   < > = values in this interval not displayed.    TUMOR MARKERS: No results for input(s): "AFPTM", "CEA", "CA199", "CHROMGRNA" in the last 8760 hours.  Assessment and Plan:  77 y/o M with history of metastatic mantle cell lymphoma admitted 09/28/23 with complaints of BLE and scrotal edema as well as shortness of breath. He was found to have extrinsic bilateral ureteral compression secondary to bulky lymphadenopathy and underwent bilateral ureteral stent placement 10/05/23 with urology. His renal function continued to worsen and a foley catheter was placed without significant improvement in renal function. IR was consulted for bilateral PCN placement however given slight improvement in renal function yesterday and small size of hydronephrosis the procedure was  put  on hold. Patient's creatinine has continued to worsen today and decision has been made to proceed with bilateral PCN placement.  Patient has been NPO since before midnight per himself and RN, he has had sips of water with medications this morning. Discussed with patient and his wife that his PCNs may remain in for the rest of his life which they understand.   Plan for procedure today in IR - patient to remain NPO until post procedure.  Risks and benefits of bilateral PCN placement was discussed with the patient including, but not limited to, infection, bleeding, significant bleeding causing loss or decrease in renal function or damage to adjacent structures.   All of the patient's questions were answered, patient is agreeable to proceed.  Consent signed and in chart.  Thank you for this interesting consult.  I greatly enjoyed meeting Jerret Butorac and look forward to participating in their care.  A copy of this report was sent to the requesting provider on this date.  Electronically Signed: Villa Herb, PA-C 10/20/2023, 10:12 AM   I spent a total of 55 Mi[place] in face to face in clinical consultation, greater than 50% of which was counseling/coordinating care for bilateral hydronephrosis.

## 2023-10-20 NOTE — Procedures (Signed)
Interventional Radiology Procedure Note  Procedure: Right PCN placement  Indication: Worsening renal function due to bilateral ureteral obstruction  Findings: Please refer to procedural dictation for full description.  Complications: None  EBL: < 10 mL  Acquanetta Belling, MD [phone]

## 2023-10-21 ENCOUNTER — Other Ambulatory Visit: Payer: Self-pay

## 2023-10-21 DIAGNOSIS — N179 Acute kidney failure, unspecified: Secondary | ICD-10-CM | POA: Diagnosis not present

## 2023-10-21 LAB — RENAL FUNCTION PANEL
Albumin: 2.6 g/dL — ABNORMAL LOW (ref 3.5–5.0)
Anion gap: 12 (ref 5–15)
BUN: 97 mg/dL — ABNORMAL HIGH (ref 8–23)
CO2: 20 mmol/L — ABNORMAL LOW (ref 22–32)
Calcium: 8.7 mg/dL — ABNORMAL LOW (ref 8.9–10.3)
Chloride: 103 mmol/L (ref 98–111)
Creatinine, Ser: 2.96 mg/dL — ABNORMAL HIGH (ref 0.61–1.24)
GFR, Estimated: 21 mL/min — ABNORMAL LOW (ref >60)
Glucose, Bld: 115 mg/dL — ABNORMAL HIGH (ref 70–99)
Phosphorus: 4.9 mg/dL — ABNORMAL HIGH (ref 2.5–4.6)
Potassium: 4 mmol/L (ref 3.5–5.1)
Sodium: 135 mmol/L (ref 135–145)

## 2023-10-21 MED ORDER — MIDODRINE HCL 5 MG PO TABS
10.0000 mg | ORAL_TABLET | Freq: Three times a day (TID) | ORAL | Status: DC
  Administered 2023-10-21 – 2023-10-22 (×3): 10 mg via ORAL
  Filled 2023-10-21 (×3): qty 2

## 2023-10-21 NOTE — Plan of Care (Signed)
  Problem: Education: Goal: Knowledge of General Education information will improve Description Including pain rating scale, medication(s)/side effects and non-pharmacologic comfort measures Outcome: Progressing   Problem: Health Behavior/Discharge Planning: Goal: Ability to manage health-related needs will improve Outcome: Progressing   

## 2023-10-21 NOTE — Progress Notes (Signed)
PROGRESS NOTE    Mohammad Oros  WGN:[id number] DOB: Apr 23, 1946 DOA: 09/28/2023 PCP: [name]   Brief Narrative:  This 77 y.o. male with medical history significant of mantle cell lymphoma, CKD IIIA, diastolic CHF, A-fib, HLD presented with B/L leg swelling and groin swelling for the past 2 weeks and worsening shortness of breath. On presentation, BUN/creatinine were more than 120 and 3.0. CT of abdomen showed progressive adenopathy along with new bilateral hydroureteronephrosis secondary to compressive effect lower bladder thickening and anasarca. Oncology and urology were consulted. He underwent ureteric stent placement for extrinsic compression on 10/05/2023. On 10/06/2023, he was found to be more tachypneic: VQ scan was suggestive of 3 moderate-sized subsegmental perfusion defects compatible with PE. He was started on heparin drip. He subsequently had hematuria requiring CBI. On 10/09/2023, hemoglobin was found to be 4.1: He was transfused 3 units packed red cells. Heparin drip was stopped. Palliative care also consulted.    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   Hemoglobin was found to be 4.1 on 10/09/2023: Status post 4 units packed red cells transfusion.   Hemoglobin 9.0 this morning.  Monitor H&H daily. Patient reports having intermittent hematuria. CT A/P without contrast on 10/09/2023 suggested  possible intramuscular hemorrhage in the psoas muscles with bilateral ureteral stents with mild bilateral hydronephrosis. Heparin remains on hold.  Will follow-up with oncology regarding plan for anticoagulation.   At this time, patient/family want to stay off of anticoagulation.   Acute hypoxic respiratory failure: Improving Possible PE with bilateral acute lower extremity DVT. VQ scan : PE+,   Heparin plan as above. Currently on room air   AKI on CKD stage IIIa: Acute metabolic acidosis > Improved. Due to combination of obstructive uropathy and ischemic ATN from relative hypotension. Creatinine was improving, Nephrology signed off on 10/13/2023 and recommended to start Lasix 40 mg p.o. twice a day.  BP currently on the lower side.  Will start Lasix if blood pressure improves. Renal functions worsening again, nephrology reconsulted.   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 Status post ureteric stent placement on 10/05/2023.  Urology following. Urine culture grew 80,000 colonies per mL of Pseudomonas aeruginosa from 10/06/2023.   Completed 7 days of ciprofloxacin. Urology recommended placement of nephrostomy tubes and family agreeable if renal functions worsens. Patient underwent right percutaneous nephrostomy tube placement, renal functions improving.  Hyponatremia: Improved. Continue to monitor   Hypokalemia: Replaced.  Continue to monitor.   Thrombocytopenia Due to mantle cell lymphoma.  Platelets improving to 60 today.  Monitor   Extensive retroperitoneal and pelvic lymphadenopathy Lymphedema Mantle cell lymphoma Goals of care Likely due to significant involvement of abdominal and pelvic lymph nodes by his mantle cell lymphoma. Oncology following. Patient is apparently not a candidate for further treatment at this time.   Currently on prednisone.  Oncology recommended hospice.   Palliative care following.  Patient wants to remain full code and wants to continue current scope of treatment.  Hypotension: Crease midodrine to 10 mg 3 times daily.   DVT prophylaxis: SCDs Code Status: Full code Family Communication:Wife at bed side Disposition Plan:  Status is: Inpatient Remains inpatient appropriate because: Due to severity of illness.   Consultants:  Cardiology Urology Nephrology Palliative care  Procedures:  None Antimicrobials:  Anti-infectives (From admission, onward)    Start     Dose/Rate Route Frequency Ordered Stop   10/20/23 1200  cefTRIAXone (ROCEPHIN) 2 g in sodium chloride 0.9 % 100 mL IVPB        2 g 200 mL/hr over 30 Minutes Intravenous On call 10/20/23 1128 10/20/23 1233   10/13/23 0800  ciprofloxacin (CIPRO) tablet 500 mg  Status:  Discontinued        500 mg Oral Daily with breakfast 10/12/23 1506 10/14/23 0929   10/12/23 2000  ciprofloxacin (CIPRO) tablet 500 mg  Status:  Discontinued        500 mg Oral 2 times daily 10/12/23 1505 10/12/23 1506   10/10/23 1430  ciprofloxacin (CIPRO) tablet 500 mg  Status:  Discontinued        500 mg Oral Daily 10/10/23 1332 10/12/23 1505   10/09/23 0800  ciprofloxacin (CIPRO) tablet 500 mg  Status:  Discontinued        500 mg Oral Daily with breakfast 10/08/23 1035 10/08/23 1049   10/08/23 1145  ciprofloxacin (CIPRO) tablet 500 mg        500 mg Oral Daily with breakfast 10/08/23 1049 10/11/23 0759   10/07/23 1800  amoxicillin-clavulanate (AUGMENTIN) 500-125 MG per tablet 1 tablet  Status:  Discontinued        1 tablet Oral Every 12 hours 10/07/23 1333 10/08/23 1035   10/06/23 1100  Ampicillin-Sulbactam (UNASYN) 3 g in sodium chloride 0.9 % 100 mL IVPB  Status:  Discontinued        3 g 200 mL/hr over 30 Minutes Intravenous Every 12 hours 10/06/23 0948 10/07/23 1333   10/05/23 1038  ceFAZolin (ANCEF) IVPB 2g/100 mL premix        2 g 200 mL/hr over 30 Minutes Intravenous 30 min pre-op 10/05/23 1038 10/05/23 1549       Subjective: Patient was seen and examined at bedside.  Overnight events noted. Patient reports feeling better, hematuria has resolved,  still has bilateral leg swelling. Patient is status post right percutaneous nephrostomy tube placement. Renal functions improving  Objective:  Vitals:   10/20/23 2241 10/21/23 0226 10/21/23 0646 10/21/23 0924  BP: (!) 94/59 (!) 87/60 (!) 84/59 (!) 86/51  Pulse: 73 73 71 78  Resp: 18 18 18 16   Temp: 97.8 F (36.6 C) 97.6 F (36.4 C) 98.1 F (36.7 C) 98 F (36.7 C)  TempSrc: Oral Oral Oral   SpO2: 100% 98% 97% 97%  Weight:      Height:        Intake/Output Summary (Last 24 hours) at 10/21/2023 1038 Last data filed at 10/21/2023 0936 Gross per 24 hour  Intake 710 ml  Output 1960 ml  Net -1250 ml   Filed Weights   10/18/23 0628 10/19/23 0547 10/20/23 0500  Weight: 107 kg 106.5 kg 108.2 kg    Examination:  General exam: Appears comfortable, deconditioned, not in any acute distress. Respiratory system: CTA bilaterally.  Respiratory effort normal. RR 14 Cardiovascular system: S1 & S2 heard, RRR. No JVD, murmurs, rubs, gallops or clicks.  Gastrointestinal system: Abdomen is  non distended, soft and non tender. Normal bowel sounds heard. Central nervous system: Alert and oriented X 3. No focal neurological deficits. Extremities:  Edema++, No cyanosis, No clubbing Skin: No rashes, lesions or ulcers Psychiatry: Judgement and insight appear normal. Mood & affect appropriate.     Data Reviewed: I have personally reviewed following labs and imaging studies  CBC: Recent Labs  Lab 10/15/23 0338 10/16/23 0329 10/18/23 0400 10/19/23 0333 10/20/23 0258  WBC 8.5 8.1 6.5 9.2 10.7*  NEUTROABS  --   --   --  7.7  --   HGB 8.9* 8.9* 9.0* 9.1* 8.6*  HCT 27.5* 27.6* 27.7* 27.4* 25.7*  MCV 90.5 89.6 89.4 89.3 88.3  PLT 74* 69* 72* 68* 60*   Basic Metabolic Panel: Recent Labs  Lab 10/15/23 0338 10/16/23 0329 10/17/23 0312 10/18/23 0400  10/19/23 0333 10/20/23 0258 10/21/23 0255  NA 136 136 133* 132* 133*  133* 132*  130* 135  K 3.4* 4.1 4.3 4.1 4.3  4.3 4.5  4.3 4.0  CL 106 105 102 101 102  102 100  100 103  CO2 21* 22 21* 21* 19*  19* 19*  18* 20*  GLUCOSE 94 97 101* 90 96  97 100*  105* 115*  BUN 48* 52* 62* 76* 82*  82* 89*  89* 97*  CREATININE 1.63* 1.90* 2.49* 2.69* 2.41*  2.35* 3.10*  3.21* 2.96*  CALCIUM 8.8* 8.9 9.0 9.0 9.3  9.3 9.1  8.9 8.7*  MG 1.9 1.9  --  2.1  --  2.3  --   PHOS  --  2.9  --  3.9 4.3 4.7*  5.0* 4.9*   GFR: Estimated Creatinine Clearance: 28.2 mL/min (A) (by C-G formula based on SCr of 2.96 mg/dL (H)). Liver Function Tests: Recent Labs  Lab 10/19/23 0333 10/20/23 0258 10/21/23 0255  ALBUMIN 3.2* 3.0* 2.6*   No results for input(s): "LIPASE", "AMYLASE" in the last 168 hours. No results for input(s): "AMMONIA" in the last 168 hours. Coagulation Profile: Recent Labs  Lab 10/19/23 0333  INR 1.3*   Cardiac Enzymes: No results for input(s): "CKTOTAL", "CKMB", "CKMBINDEX", "TROPONINI" in the last 168 hours. BNP (last 3 results) No results for input(s): "PROBNP" in the last 8760 hours. HbA1C: No results for input(s): "HGBA1C" in the last 72 hours. CBG: Recent Labs  Lab 10/17/23 0009  GLUCAP 110*   Lipid Profile: No results for input(s): "CHOL", "HDL", "LDLCALC", "TRIG", "CHOLHDL", "LDLDIRECT" in the last 72 hours. Thyroid Function Tests: No results for input(s): "TSH", "T4TOTAL", "FREET4", "T3FREE", "THYROIDAB" in the last 72 hours. Anemia Panel: No results for input(s): "VITAMINB12", "FOLATE", "FERRITIN", "TIBC", "IRON", "RETICCTPCT" in the last 72 hours. Sepsis Labs: No results for input(s): "PROCALCITON", "LATICACIDVEN" in the last 168 hours.  Recent Results (from the past 240 hours)  Surgical pcr screen     Status: None   Collection Time: 10/19/23  2:02 AM   Specimen: Nasal Mucosa; Nasal Swab  Result Value Ref Range Status   MRSA, PCR NEGATIVE  NEGATIVE Final   Staphylococcus aureus NEGATIVE NEGATIVE Final    Comment: (NOTE) The Xpert SA Assay (FDA approved for NASAL specimens in patients 38 years of age and older), is one component of a comprehensive surveillance program. It is not intended to diagnose infection nor to guide or monitor treatment. Performed at [facility], [address] [place], [place] [address]   Aerobic/Anaerobic Culture w Gram Stain (surgical/deep wound)     Status: None (Preliminary result)  Collection Time: 10/20/23 12:25 PM   Specimen: Urine, Random  Result Value Ref Range Status   Specimen Description URINE, RANDOM  Final   Special Requests right kidney  Final   Gram Stain RARE WBC SEEN FEW GRAM NEGATIVE RODS   Final   Culture   Final    TOO YOUNG TO READ Performed at [facility], [address], [place], [place] [address]    Report Status PENDING  Incomplete     Radiology Studies: IR NEPHROSTOMY PLACEMENT RIGHT Result Date: 10/20/2023 INDICATION: 77 year old gentleman with history of bilateral ureteral obstruction due to retroperitoneal lymphadenopathy from lymphoma has worsening renal failure despite presence of bilateral ureteral stents. IR consulted for nephrostomy drain placement. EXAM: 1. Successful placement of right percutaneous nephrostomy drain 2. Unsuccessful placement of left pregnancy nephrostomy drain COMPARISON:  CT abdomen pelvis 10/18/2023 MEDICATIONS: Rocephin 1 gm IV; The antibiotic was administered in an appropriate time frame prior to skin puncture. ANESTHESIA/SEDATION: Fentanyl 100 mcg IV; Versed 2 mg IV Moderate Sedation Time:  35 The patient was continuously monitored during the procedure by the interventional radiology nurse under my direct supervision. CONTRAST:  10 mL of Omnipaque 300-administered into the collecting system(s) FLUOROSCOPY TIME:  Radiation Exposure Index (as provided by the fluoroscopic device): 29 mGy Kerma COMPLICATIONS: None  immediate. PROCEDURE: Informed written consent was obtained from the patient after a thorough discussion of the procedural risks, benefits and alternatives. All questions were addressed. Maximal Sterile Barrier Technique was utilized including caps, mask, sterile gowns, sterile gloves, sterile drape, hand hygiene and skin antiseptic. A timeout was performed prior to the initiation of the procedure. Patient position prone on the procedure table. Bilateral flank skin prepped and draped in usual fashion. Sonographic evaluation of the right kidney demonstrated mild hydronephrosis. Following local administration, an upper pole calyx was accessed with a 21 gauge needle utilizing continuous ultrasound guidance. 21 gauge needle exchanged for transitional dilator set over 0.018 inch guidewire. Contrast administered through the transitional dilator confirmed appropriate access of the renal collecting system. The transitional dilator was exchanged for 10 [place] multipurpose pigtail drain over 0.035 inch guidewire. 5 mL of purulent urine was aspirated and sent for Gram stain and culture. The drain was secured to skin with suture and connected to bag. Sonographic evaluation of the left kidney demonstrated minimal hydronephrosis. Following local administration, multiple attempts were made to access mid and lower pole calices with a 21 gauge needle utilizing continuous ultrasound guidance, however none were successful. Given only minimal hydronephrosis of the left kidney, the procedure was terminated. IMPRESSION: 1. Successful insertion of 10 French right nephrostomy drain. 2. Unsuccessful attempt at insertion of left nephrostomy drain due to minimal hydronephrosis. If the patient's renal function does not significantly improve, repeat ultrasound should be performed to evaluate the degree of right hydronephrosis. Electronically Signed   By: Acquanetta Belling M.D.   On: 10/20/2023 14:39    Scheduled Meds:  atorvastatin  10 mg Oral  QHS   Chlorhexidine Gluconate Cloth  6 each Topical Daily   docusate sodium  100 mg Oral BID   feeding supplement  237 mL Oral BID BM   liver oil-zinc oxide   Topical BID   midodrine  10 mg Oral TID WC   multivitamin with minerals  1 tablet Oral Daily   oxybutynin  10 mg Oral QHS   pantoprazole  40 mg Oral Daily   predniSONE  40 mg Oral Q breakfast   senna  1 tablet Oral BID  sodium bicarbonate  650 mg Oral BID   sodium chloride flush  3 mL Intravenous Q12H   Continuous Infusions:   LOS: 23 days    Time spent: 35 mins    Willeen Niece, MD [facility]   If 7PM-7AM, please contact night-coverage

## 2023-10-21 NOTE — Progress Notes (Signed)
16 Days Post-Op Subjective: Patient resting comfortably with no complaints.  States that he is feeling much better this morning.  He underwent right PCN placement yesterday.  Left PCN was not placed due to minimal hydronephrosis and difficult stick.  Objective: Vital signs in last 24 hours: Temp:  [97.6 F (36.4 C)-98.7 F (37.1 C)] 98 F (36.7 C) (12/22 0924) Pulse Rate:  [71-129] 78 (12/22 0924) Resp:  [0-20] 16 (12/22 0924) BP: (84-117)/(51-73) 86/51 (12/22 0924) SpO2:  [97 %-100 %] 97 % (12/22 0924)  Intake/Output from previous day: 12/21 0701 - 12/22 0700 In: 530 [P.O.:530] Out: 1710 [Urine:1710]  Intake/Output this shift: Total I/O In: 180 [P.O.:180] Out: 250 [Urine:250]  Physical Exam:  General: Alert and oriented GU: Right PCN in place and draining amber urine  Lab Results: Recent Labs    10/19/23 0333 10/20/23 0258  HGB 9.1* 8.6*  HCT 27.4* 25.7*   BMET Recent Labs    10/20/23 0258 10/21/23 0255  NA 132*  130* 135  K 4.5  4.3 4.0  CL 100  100 103  CO2 19*  18* 20*  GLUCOSE 100*  105* 115*  BUN 89*  89* 97*  CREATININE 3.10*  3.21* 2.96*  CALCIUM 9.1  8.9 8.7*     Studies/Results: IR NEPHROSTOMY PLACEMENT RIGHT Result Date: 10/20/2023 INDICATION: 77 year old gentleman with history of bilateral ureteral obstruction due to retroperitoneal lymphadenopathy from lymphoma has worsening renal failure despite presence of bilateral ureteral stents. IR consulted for nephrostomy drain placement. EXAM: 1. Successful placement of right percutaneous nephrostomy drain 2. Unsuccessful placement of left pregnancy nephrostomy drain COMPARISON:  CT abdomen pelvis 10/18/2023 MEDICATIONS: Rocephin 1 gm IV; The antibiotic was administered in an appropriate time frame prior to skin puncture. ANESTHESIA/SEDATION: Fentanyl 100 mcg IV; Versed 2 mg IV Moderate Sedation Time:  35 The patient was continuously monitored during the procedure by the interventional radiology  nurse under my direct supervision. CONTRAST:  10 mL of Omnipaque 300-administered into the collecting system(s) FLUOROSCOPY TIME:  Radiation Exposure Index (as provided by the fluoroscopic device): 29 mGy Kerma COMPLICATIONS: None immediate. PROCEDURE: Informed written consent was obtained from the patient after a thorough discussion of the procedural risks, benefits and alternatives. All questions were addressed. Maximal Sterile Barrier Technique was utilized including caps, mask, sterile gowns, sterile gloves, sterile drape, hand hygiene and skin antiseptic. A timeout was performed prior to the initiation of the procedure. Patient position prone on the procedure table. Bilateral flank skin prepped and draped in usual fashion. Sonographic evaluation of the right kidney demonstrated mild hydronephrosis. Following local administration, an upper pole calyx was accessed with a 21 gauge needle utilizing continuous ultrasound guidance. 21 gauge needle exchanged for transitional dilator set over 0.018 inch guidewire. Contrast administered through the transitional dilator confirmed appropriate access of the renal collecting system. The transitional dilator was exchanged for 10 [place] multipurpose pigtail drain over 0.035 inch guidewire. 5 mL of purulent urine was aspirated and sent for Gram stain and culture. The drain was secured to skin with suture and connected to bag. Sonographic evaluation of the left kidney demonstrated minimal hydronephrosis. Following local administration, multiple attempts were made to access mid and lower pole calices with a 21 gauge needle utilizing continuous ultrasound guidance, however none were successful. Given only minimal hydronephrosis of the left kidney, the procedure was terminated. IMPRESSION: 1. Successful insertion of 10 French right nephrostomy drain. 2. Unsuccessful attempt at insertion of left nephrostomy drain due to minimal hydronephrosis. If the patient's renal  function does  not significantly improve, repeat ultrasound should be performed to evaluate the degree of right hydronephrosis. Electronically Signed   By: Acquanetta Belling M.D.   On: 10/20/2023 14:39    Assessment/Plan: 54 yM with mantel cell lymphoma causing extrinsic compression of both ureters with acute renal insufficiency secondary to extrinsic compression, stent failure.  S/p right percutaneous nephrostomy tube placement on 10/20/2023  -Continue to monitor right PCN output -Will arrange outpatient follow-up with Dr. Alvester Morin to discuss ureteral stent management -Call back if needed   LOS: 23 days   Jerry Moody, MD [facility] Pager: [phone]  10/21/2023, 10:04 AM

## 2023-10-21 NOTE — Progress Notes (Signed)
NP Garner Nash was notified of patient's BP is 84/59, not symptomatic.

## 2023-10-22 ENCOUNTER — Other Ambulatory Visit: Payer: Self-pay | Admitting: Pharmacy Technician

## 2023-10-22 DIAGNOSIS — N179 Acute kidney failure, unspecified: Secondary | ICD-10-CM | POA: Diagnosis not present

## 2023-10-22 LAB — CBC
HCT: 25.4 % — ABNORMAL LOW (ref 39.0–52.0)
Hemoglobin: 8.1 g/dL — ABNORMAL LOW (ref 13.0–17.0)
MCH: 28.9 pg (ref 26.0–34.0)
MCHC: 31.9 g/dL (ref 30.0–36.0)
MCV: 90.7 fL (ref 80.0–100.0)
Platelets: 51 10*3/uL — ABNORMAL LOW (ref 150–400)
RBC: 2.8 MIL/uL — ABNORMAL LOW (ref 4.22–5.81)
RDW: 17.3 % — ABNORMAL HIGH (ref 11.5–15.5)
WBC: 8.2 10*3/uL (ref 4.0–10.5)
nRBC: 0 % (ref 0.0–0.2)

## 2023-10-22 LAB — BASIC METABOLIC PANEL
Anion gap: 11 (ref 5–15)
BUN: 84 mg/dL — ABNORMAL HIGH (ref 8–23)
CO2: 20 mmol/L — ABNORMAL LOW (ref 22–32)
Calcium: 8.7 mg/dL — ABNORMAL LOW (ref 8.9–10.3)
Chloride: 103 mmol/L (ref 98–111)
Creatinine, Ser: 2.3 mg/dL — ABNORMAL HIGH (ref 0.61–1.24)
GFR, Estimated: 29 mL/min — ABNORMAL LOW (ref >60)
Glucose, Bld: 98 mg/dL (ref 70–99)
Potassium: 4.1 mmol/L (ref 3.5–5.1)
Sodium: 134 mmol/L — ABNORMAL LOW (ref 135–145)

## 2023-10-22 LAB — PHOSPHORUS: Phosphorus: 4.4 mg/dL (ref 2.5–4.6)

## 2023-10-22 LAB — MAGNESIUM: Magnesium: 2.6 mg/dL — ABNORMAL HIGH (ref 1.7–2.4)

## 2023-10-22 MED ORDER — SODIUM BICARBONATE 650 MG PO TABS: 650.0000 mg | ORAL_TABLET | Freq: Two times a day (BID) | ORAL | Status: DC

## 2023-10-22 MED ORDER — OXYBUTYNIN CHLORIDE ER 10 MG PO TB24: 10.0000 mg | ORAL_TABLET | Freq: Every day | ORAL | Status: DC

## 2023-10-22 MED ORDER — PREDNISONE 20 MG PO TABS: 40.0000 mg | ORAL_TABLET | Freq: Every day | ORAL | Status: DC

## 2023-10-22 MED ORDER — POLYETHYLENE GLYCOL 3350 17 G PO PACK: 17.0000 g | PACK | Freq: Every day | ORAL | Status: DC | PRN

## 2023-10-22 MED ORDER — MIDODRINE HCL 10 MG PO TABS: 10.0000 mg | ORAL_TABLET | Freq: Three times a day (TID) | ORAL | Status: DC

## 2023-10-22 MED ORDER — CYCLOBENZAPRINE HCL 5 MG PO TABS: 5.0000 mg | ORAL_TABLET | Freq: Three times a day (TID) | ORAL | Status: DC | PRN

## 2023-10-22 MED ORDER — DOCUSATE SODIUM 100 MG PO CAPS: 100.0000 mg | ORAL_CAPSULE | Freq: Two times a day (BID) | ORAL | Status: DC

## 2023-10-22 MED ORDER — SIMETHICONE 80 MG PO CHEW
80.0000 mg | CHEWABLE_TABLET | Freq: Once | ORAL | Status: AC
  Administered 2023-10-22: 80 mg via ORAL
  Filled 2023-10-22: qty 1

## 2023-10-22 MED ORDER — HEPARIN SOD (PORK) LOCK FLUSH 100 UNIT/ML IV SOLN
500.0000 [IU] | INTRAVENOUS | Status: AC | PRN
  Administered 2023-10-22: 500 [IU]
  Filled 2023-10-22: qty 5

## 2023-10-22 MED ORDER — SENNA 8.6 MG PO TABS: 1.0000 | ORAL_TABLET | Freq: Two times a day (BID) | ORAL | Status: DC

## 2023-10-22 NOTE — Progress Notes (Signed)
Oral Oncology Patient Advocate Encounter  Patient currently admitted. [facility] rose can be reopened at a later time if needed.  Jinger Neighbors, CPhT-Adv Oncology Pharmacy Patient Advocate [facility] Direct Number: [phone]  Fax: [phone]

## 2023-10-22 NOTE — Discharge Summary (Addendum)
Physician Discharge Summary  Jerry Tran WUJ:[id number] DOB: 04/16/1946 DOA: 09/28/2023  PCP: [facility]  Admit date: 09/28/2023 Discharge date: 10/22/2023  Admitted From: Home Disposition: SNF  Discharge Condition:Stable CODE STATUS:FULL Diet recommendation:  Regular   Brief/Interim Summary: Patient is a  77 y.o. male with medical history significant of mantle cell lymphoma, CKD IIIA, diastolic CHF, A-fib, HLD presented with B/L leg swelling and groin swelling for the past 2 weeks and worsening shortness of breath. On presentation, BUN/creatinine were more than 120 and 3.0. CT of abdomen showed progressive adenopathy along with new bilateral hydroureteronephrosis secondary to compressive effect lower bladder thickening and anasarca. Oncology and urology were consulted. He underwent ureteric stent placement for extrinsic compression on 10/05/2023. On 10/06/2023, he was found to be more tachypneic: VQ scan was suggestive of 3 moderate-sized subsegmental perfusion defects compatible with PE. He was started on heparin drip. He subsequently had hematuria requiring CBI.  Heparin drip was stopped. Palliative care also consulted.  Patient wants to remain full code and continue aggressive treatment.  Nephrology, urology have signed off.  PT recommended SNF on discharge.  Medically stable for discharge to SNF.  We recommend to follow-up with oncology, urology and Palliaitve  care as an outpatient.  Following problems were addressed during the hospitalization:  Acute blood loss anemia: Anemia of the chronic disease: Possibly from hematuria and retroperitoneal bleeding.   Hemoglobin was found to be 4.1 on 10/09/2023: Status post 4 units packed red cells transfusion.   CT A/P without contrast on 10/09/2023 suggested possible intramuscular hemorrhage in the psoas muscles with bilateral ureteral stents with mild bilateral hydronephrosis. Heparin remains on hold.  At this time, patient/family  want to stay off of anticoagulation. Check CBC in next 3 to 5 days   Acute hypoxic respiratory failure: Possible PE with bilateral acute lower extremity DVT. VQ scan : PE+,    Currently on room air   AKI on CKD stage IIIa: Due to combination of obstructive uropathy and ischemic ATN from relative hypotension. Nephrology signed off.  Kidney function is stable.  Cannot tolerate Lasix due to soft blood pressure.   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 Status post ureteric stent placement on 10/05/2023.  Urology following. Urine culture grew 80,000 colonies per mL of Pseudomonas aeruginosa from 10/06/2023.   Completed 7 days of ciprofloxacin. Patient underwent right percutaneous nephrostomy tube placement, renal function improving.  He needs to follow-up with Dr. Alvester Morin as an outpatient.   Hyponatremia: Improved. Continue to monitor    Thrombocytopenia Due to mantle cell lymphoma.  Stable   Extensive retroperitoneal and pelvic lymphadenopathy Lymphedema Mantle cell lymphoma Goals of care Likely due to significant involvement of abdominal and pelvic lymph nodes by his mantle cell lymphoma. Oncology was following.He was on Jaypirca .As per oncology,he not a candidate for further treatment at this time.  Chemotherapy will be stopped. Currently on prednisone.  Oncology recommended hospice.   Palliative care was  following.  Patient wants to remain full code and wants to continue current scope of treatment.  We recommend to follow-up with oncology, palliative care as outpatient.   Hypotension: Continue midodrine to 10 mg 3 times daily.   Discharge Diagnoses:  Principal Problem:   AKI (acute kidney injury) (HCC) Active Problems:   Lymphadenopathy   Hypertension   Dyslipidemia   PAF (paroxysmal atrial fibrillation) (HCC)   Chronic kidney disease, stage 3b (HCC)   Mantle cell lymphoma of lymph nodes  of multiple regions  [facility])   Anemia associated with chemotherapy   Bilateral lower extremity edema   Anasarca   Other pancytopenia (HCC)   Malnutrition of moderate degree   Counseling regarding advance care planning and goals of care    Discharge Instructions  Discharge Instructions     Clinic Appointment Request   Complete by: Oct 03, 2023    Contact your oncology clinic or infusion center to schedule this appointment.   Infusion Appointment Request (300 min)   Complete by: Oct 03, 2023    Contact your oncology clinic or infusion center to schedule this appointment.   Lab Appointment Request   Complete by: Oct 03, 2023    Patient requires a Lab or Flush Appointment?: Flush Appointment   Contact your oncology clinic or infusion center to schedule this appointment.   Infusion Appointment Request   Complete by: Oct 04, 2023    Contact your oncology clinic or infusion center to schedule this appointment.   Diet general   Complete by: As directed    Discharge instructions   Complete by: As directed    1)Please take your medications as instructed 2)Follow up with your oncologist and urology.  Name and number of the providers have been attached 3)Do  a CBC and CMP tests in a  week 4)Follow up with outpatient hospice/Palliative care   Increase activity slowly   Complete by: As directed       Allergies as of 10/22/2023       Reactions   Sildenafil Other (See Comments)   Unknown reaction - reported by [facility]        Medication List     STOP taking these medications    flecainide 100 MG tablet Commonly known as: TAMBOCOR   furosemide 20 MG tablet Commonly known as: LASIX   hydrocortisone 25 MG suppository Commonly known as: ANUSOL-HC   Jaypirca 100 MG tablet Generic drug: pirtobrutinib   LORazepam 0.5 MG tablet Commonly known as: Ativan   magic mouthwash w/lidocaine Soln   metoprolol tartrate 25 MG tablet Commonly known as: LOPRESSOR   mirtazapine 30 MG tablet Commonly known  as: REMERON   nystatin 100000 UNIT/ML suspension Commonly known as: MYCOSTATIN   Potassium Chloride ER 20 MEQ Tbcr   potassium chloride SA 20 MEQ tablet Commonly known as: KLOR-CON M   prazosin 2 MG capsule Commonly known as: MINIPRESS   sucralfate 1 GM/10ML suspension Commonly known as: Carafate   torsemide 10 MG tablet Commonly known as: DEMADEX       TAKE these medications    acyclovir 400 MG tablet Commonly known as: ZOVIRAX TAKE 1 TABLET(400 MG) BY MOUTH DAILY   allopurinol 100 MG tablet Commonly known as: Zyloprim Take 1 tablet (100 mg total) by mouth daily.   ammonium lactate 12 % lotion Commonly known as: LAC-HYDRIN Apply 1 application topically 2 (two) times daily as needed for dry skin.   atorvastatin 20 MG tablet Commonly known as: LIPITOR Take 10 mg by mouth at bedtime.   cyclobenzaprine 5 MG tablet Commonly known as: FLEXERIL Take 1 tablet (5 mg total) by mouth 3 (three) times daily as needed for muscle spasms.   diclofenac Sodium 1 % Gel Commonly known as: VOLTAREN Apply 2 g topically 4 (four) times daily as needed (pain).   docusate sodium 100 MG capsule Commonly known as: COLACE Take 1 capsule (100 mg total) by mouth 2 (two) times daily.   feeding supplement Liqd Take 237 mLs by mouth  2 (two) times daily between meals. What changed: Another medication with the same name was removed. Continue taking this medication, and follow the directions you see here.   fluticasone 50 MCG/ACT nasal spray Commonly known as: FLONASE Place 1 spray into both nostrils daily as needed for allergies or rhinitis.   hydroxypropyl methylcellulose / hypromellose 2.5 % ophthalmic solution Commonly known as: ISOPTO TEARS / GONIOVISC Place 2 drops into both eyes 2 (two) times daily as needed for dry eyes.   lidocaine-prilocaine cream Commonly known as: EMLA Apply to affected area once   magic mouthwash (multi-ingredient) oral suspension Take 5 mLs by mouth 4  times daily as needed for mouth and throat pain.   midodrine 10 MG tablet Commonly known as: PROAMATINE Take 1 tablet (10 mg total) by mouth 3 (three) times daily with meals.   omeprazole 20 MG capsule Commonly known as: PRILOSEC Take 20 mg by mouth daily before breakfast.   ondansetron 8 MG tablet Commonly known as: Zofran Take 1 tablet (8 mg total) by mouth every 8 (eight) hours as needed for nausea or vomiting. Start on the third day after chemotherapy.   oxybutynin 10 MG 24 hr tablet Commonly known as: DITROPAN-XL Take 1 tablet (10 mg total) by mouth at bedtime.   polyethylene glycol 17 g packet Commonly known as: MIRALAX / GLYCOLAX Take 17 g by mouth daily as needed for mild constipation.   predniSONE 20 MG tablet Commonly known as: DELTASONE Take 2 tablets (40 mg total) by mouth daily with breakfast.   prochlorperazine 10 MG tablet Commonly known as: COMPAZINE Take 1 tablet (10 mg total) by mouth every 6 (six) hours as needed for nausea or vomiting.   senna 8.6 MG Tabs tablet Commonly known as: SENOKOT Take 1 tablet (8.6 mg total) by mouth 2 (two) times daily.   sodium bicarbonate 650 MG tablet Take 1 tablet (650 mg total) by mouth 2 (two) times daily.   terbinafine 1 % cream Commonly known as: LAMISIL Apply 1 application topically 2 (two) times daily as needed (athlete's foot).   triamcinolone cream 0.1 % Commonly known as: KENALOG Apply 1 application topically 2 (two) times daily as needed (rash).   VITAMIN D3 PO Take 1 tablet by mouth every morning.        Contact information for follow-up providers     [facility] Follow up in 1 week(s).   Specialty: General Practice Contact information: [address] [place] [address] [phone]         Ray Church III, MD Follow up in 2 day(s).   Specialty: Urology Contact information: [address] [place] [address] [phone]         [name], MD Follow up  in 2 day(s).   Specialties: Hematology, Oncology Contact information: [address] [place] [address] [phone]              Contact information for after-discharge care     Destination     [facility] .   Service: Skilled Nursing Contact information: [address] [place] [address] [phone]                    Allergies  Allergen Reactions   Sildenafil Other (See Comments)    Unknown reaction - reported by [facility]    Consultations: Oncology, nephrology, urology, palliative care   Procedures/Studies: IR NEPHROSTOMY PLACEMENT RIGHT Result Date: 10/20/2023 INDICATION: 77 year old gentleman with  history of bilateral ureteral obstruction due to retroperitoneal lymphadenopathy from lymphoma has worsening renal failure despite presence of bilateral ureteral stents. IR consulted for nephrostomy drain placement. EXAM: 1. Successful placement of right percutaneous nephrostomy drain 2. Unsuccessful placement of left pregnancy nephrostomy drain COMPARISON:  CT abdomen pelvis 10/18/2023 MEDICATIONS: Rocephin 1 gm IV; The antibiotic was administered in an appropriate time frame prior to skin puncture. ANESTHESIA/SEDATION: Fentanyl 100 mcg IV; Versed 2 mg IV Moderate Sedation Time:  35 The patient was continuously monitored during the procedure by the interventional radiology nurse under my direct supervision. CONTRAST:  10 mL of Omnipaque 300-administered into the collecting system(s) FLUOROSCOPY TIME:  Radiation Exposure Index (as provided by the fluoroscopic device): 29 mGy Kerma COMPLICATIONS: None immediate. PROCEDURE: Informed written consent was obtained from the patient after a thorough discussion of the procedural risks, benefits and alternatives. All questions were addressed. Maximal Sterile Barrier Technique was utilized including caps, mask, sterile gowns, sterile gloves, sterile drape, hand hygiene and skin  antiseptic. A timeout was performed prior to the initiation of the procedure. Patient position prone on the procedure table. Bilateral flank skin prepped and draped in usual fashion. Sonographic evaluation of the right kidney demonstrated mild hydronephrosis. Following local administration, an upper pole calyx was accessed with a 21 gauge needle utilizing continuous ultrasound guidance. 21 gauge needle exchanged for transitional dilator set over 0.018 inch guidewire. Contrast administered through the transitional dilator confirmed appropriate access of the renal collecting system. The transitional dilator was exchanged for 10 [place] multipurpose pigtail drain over 0.035 inch guidewire. 5 mL of purulent urine was aspirated and sent for Gram stain and culture. The drain was secured to skin with suture and connected to bag. Sonographic evaluation of the left kidney demonstrated minimal hydronephrosis. Following local administration, multiple attempts were made to access mid and lower pole calices with a 21 gauge needle utilizing continuous ultrasound guidance, however none were successful. Given only minimal hydronephrosis of the left kidney, the procedure was terminated. IMPRESSION: 1. Successful insertion of 10 French right nephrostomy drain. 2. Unsuccessful attempt at insertion of left nephrostomy drain due to minimal hydronephrosis. If the patient's renal function does not significantly improve, repeat ultrasound should be performed to evaluate the degree of right hydronephrosis. Electronically Signed   By: Acquanetta Belling M.D.   On: 10/20/2023 14:39   CT ABDOMEN PELVIS WO CONTRAST Result Date: 10/18/2023 CLINICAL DATA:  Kidney failure stenting EXAM: CT ABDOMEN AND PELVIS WITHOUT CONTRAST TECHNIQUE: Multidetector CT imaging of the abdomen and pelvis was performed following the standard protocol without IV contrast. RADIATION DOSE REDUCTION: This exam was performed according to the departmental dose-optimization  program which includes automated exposure control, adjustment of the mA and/or kV according to patient size and/or use of iterative reconstruction technique. COMPARISON:  CT 10/09/2023, 09/28/2023 FINDINGS: Lower chest: Lung bases demonstrate minimal atelectasis or scarring. No acute airspace disease. Differential blood cardiac pool suggesting anemia Hepatobiliary: Stable hepatic cysts. No calcified gallstone or biliary dilatation. Pancreas: Unremarkable. No pancreatic ductal dilatation or surrounding inflammatory changes. Spleen: Normal in size without focal abnormality. Adrenals/Urinary Tract: Adrenal glands are within normal limits. Bilateral ureteral stent with proximal pigtails in the renal collecting system and distal pigtails in the posterior bladder. Small amount of air within the renal collecting systems and adjacent to the proximal left and mid bilateral ureteral stents. Small amount of air within the urinary bladder. Mild hydronephrosis probably similar compared to the most recent prior CT. Stomach/Bowel: Stomach nonenlarged. No dilated small bowel.  No acute bowel wall thickening. Negative appendix. Vascular/Lymphatic: Aortic atherosclerosis. Extensive bulky retroperitoneal, pelvic, iliac and inguinal adenopathy corresponding to history of lymphoma. Reproductive: Clips at the prostate. Extensive scrotal edema and fluid. Other: Negative for free air. Small pelvic effusion. Extensive subcutaneous edema. Musculoskeletal: No acute osseous abnormality. Decreased hyperdense psoas muscle enlargement compared to most recent prior CT. IMPRESSION: 1. Bilateral ureteral stents in place. Similar degree of mild bilateral hydronephrosis. New small amount of air within the renal collecting systems and urinary bladder, correlate for recent instrumentation. If none recently, consider gas-forming infection with reflux of air into the renal collecting systems and ureters. 2. Extensive bulky retroperitoneal, pelvic, iliac  and inguinal adenopathy corresponding to history of lymphoma. 3. Extensive subcutaneous edema and scrotal edema/fluid. Small pelvic effusion. 4. Decreased hyperdense psoas muscle enlargement compared to prior likely resolving intramuscular hematomas. 5. Aortic atherosclerosis. Aortic Atherosclerosis (ICD10-I70.0). Electronically Signed   By: Jasmine Pang M.D.   On: 10/18/2023 19:06   CT ABDOMEN PELVIS WO CONTRAST Result Date: 10/09/2023 CLINICAL DATA:  Retroperitoneal bleed suspected.  Lymphoma. EXAM: CT ABDOMEN AND PELVIS WITHOUT CONTRAST TECHNIQUE: Multidetector CT imaging of the abdomen and pelvis was performed following the standard protocol without IV contrast. RADIATION DOSE REDUCTION: This exam was performed according to the departmental dose-optimization program which includes automated exposure control, adjustment of the mA and/or kV according to patient size and/or use of iterative reconstruction technique. COMPARISON:  CT abdomen pelvis dated 09/28/2023. FINDINGS: Evaluation of this exam is limited in the absence of intravenous contrast. Lower chest: Partially visualized small bilateral pleural effusions with partial compressive atelectasis of the lower lobes. Pneumonia is not excluded. The tip of a central venous line noted at the cavoatrial junction. There is hypoattenuation of the cardiac blood pool suggestive of anemia. Clinical correlation is recommended. No intra-abdominal free air.  Small ascites. Hepatobiliary: Several small liver cysts. No biliary dilatation. No calcified gallstone. Pancreas: Unremarkable. No pancreatic ductal dilatation or surrounding inflammatory changes. Spleen: Normal in size without focal abnormality. Adrenals/Urinary Tract: Bilateral pigtail ureteral stents noted with proximal tips in the renal pelvis and distal end in the urinary bladder. There is mild bilateral hydronephrosis similar to prior CT. No stone noted. The urinary bladder is decompressed around a Foley  catheter. Stomach/Bowel: Small hiatal hernia. Mildly thickened small bowel loops, likely related to ascites and anasarca. Enteritis is less likely. There is no bowel obstruction. The appendix is normal. Vascular/Lymphatic: Mild aortoiliac atherosclerotic disease. The IVC is unremarkable. No portal venous gas. Bulky retroperitoneal and bilateral pelvic sidewall adenopathy. Right inguinal adenopathy as seen previously. Reproductive: Several biopsy clips in the prostate gland. Other: There is diffuse subcutaneous edema and anasarca, significantly progressed since the prior CT. Enlargement of the psoas muscles with ill-defined higher attenuation suspicious for intramuscular hemorrhage. Musculoskeletal: Osteopenia with degenerative changes of the spine. No acute osseous pathology. IMPRESSION: 1. Enlargement of the psoas muscles suspicious for intramuscular hemorrhage. 2. Bulky retroperitoneal and bilateral pelvic sidewall adenopathy. 3. Bilateral pigtail ureteral stents with mild bilateral hydronephrosis similar to prior CT. 4. Small ascites. Diffuse subcutaneous edema and anasarca, significantly progressed since the prior CT. 5. Partially visualized small bilateral pleural effusions with partial compressive atelectasis of the lower lobes. Pneumonia is not excluded. 6.  Aortic Atherosclerosis (ICD10-I70.0). Electronically Signed   By: Elgie Collard M.D.   On: 10/09/2023 20:26   VAS [place] LOWER EXTREMITY VENOUS (DVT) Result Date: 10/07/2023  Lower Venous DVT Study Patient Name:  SOSTENES ERLICH  Date of Exam:   10/06/2023  Medical Rec #: [id number]     Accession #:    [id number] Date of Birth: Oct 23, 1946     Patient Gender: M Patient Age:   67 years Exam Location:  [facility] Procedure:      VAS [place] LOWER EXTREMITY VENOUS (DVT) Referring Phys: Rhetta Mura --------------------------------------------------------------------------------  Indications: Swelling, and pulmonary embolism.  Risk Factors: Cancer.  Limitations: Body habitus and poor ultrasound/tissue interface. Comparison Study: No prior studies. Performing Technologist: Chanda Busing RVT  Examination Guidelines: A complete evaluation includes B-mode imaging, spectral Doppler, color Doppler, and power Doppler as needed of all accessible portions of each vessel. Bilateral testing is considered an integral part of a complete examination. Limited examinations for reoccurring indications may be performed as noted. The reflux portion of the exam is performed with the patient in reverse Trendelenburg.  +---------+---------------+---------+-----------+----------+-------------------+ RIGHT    CompressibilityPhasicitySpontaneityPropertiesThrombus Aging      +---------+---------------+---------+-----------+----------+-------------------+ CFV      Partial        Yes      Yes                  Acute               +---------+---------------+---------+-----------+----------+-------------------+ FV Prox  None           No       No                   Acute               +---------+---------------+---------+-----------+----------+-------------------+ FV Mid   None           No       No                   Acute               +---------+---------------+---------+-----------+----------+-------------------+ FV DistalNone           No       No                   Acute               +---------+---------------+---------+-----------+----------+-------------------+ PFV                                                   Not well visualized +---------+---------------+---------+-----------+----------+-------------------+ POP      Full           Yes      Yes                                      +---------+---------------+---------+-----------+----------+-------------------+ PTV      Full                                                             +---------+---------------+---------+-----------+----------+-------------------+ PERO      Full                                                             +---------+---------------+---------+-----------+----------+-------------------+  EIV                     Yes      Yes                                      +---------+---------------+---------+-----------+----------+-------------------+ The EIV appears patent.  +---------+---------------+---------+-----------+----------+--------------+ LEFT     CompressibilityPhasicitySpontaneityPropertiesThrombus Aging +---------+---------------+---------+-----------+----------+--------------+ CFV      Full           Yes      Yes                                 +---------+---------------+---------+-----------+----------+--------------+ SFJ      Full                                                        +---------+---------------+---------+-----------+----------+--------------+ FV Prox  Full                                                        +---------+---------------+---------+-----------+----------+--------------+ FV Mid   Full                                                        +---------+---------------+---------+-----------+----------+--------------+ FV DistalFull                                                        +---------+---------------+---------+-----------+----------+--------------+ PFV      Full                                                        +---------+---------------+---------+-----------+----------+--------------+ POP      None           No       No                   Acute          +---------+---------------+---------+-----------+----------+--------------+ PTV      Full                                                        +---------+---------------+---------+-----------+----------+--------------+ PERO     Full                                                        +---------+---------------+---------+-----------+----------+--------------+  Gastroc   Full                                                        +---------+---------------+---------+-----------+----------+--------------+     Summary: RIGHT: - Findings consistent with acute deep vein thrombosis involving the right common femoral vein, and right femoral vein.  - No cystic structure found in the popliteal fossa.  LEFT: - Findings consistent with acute deep vein thrombosis involving the left popliteal vein.  - No cystic structure found in the popliteal fossa.  *See table(s) above for measurements and observations. Electronically signed by Carolynn Sayers on 10/07/2023 at 8:57:49 AM.    Final    NM Pulmonary Perfusion Result Date: 10/06/2023 CLINICAL DATA:  Aggressive shortness of breath. Left groin swelling. Severe acute kidney injury. Mantle cell lymphoma. EXAM: NUCLEAR MEDICINE PERFUSION LUNG SCAN TECHNIQUE: Perfusion images were obtained in multiple projections after intravenous injection of radiopharmaceutical. Ventilation scans intentionally deferred if perfusion scan and chest x-ray adequate for interpretation during COVID 19 epidemic. RADIOPHARMACEUTICALS:  3.95 mCi Tc-21m MAA IV COMPARISON:  Portable chest obtained earlier today. FINDINGS: Moderate-sized subsegmental perfusion defects in the anteromedial left upper lobe, lateral right lower lobe and anteromedial right middle lobe. No corresponding abnormality at these locations on the chest radiograph. IMPRESSION: Three moderate-sized subsegmental perfusion defects with no corresponding radiographic abnormality, compatible with pulmonary embolism. Electronically Signed   By: Beckie Salts M.D.   On: 10/06/2023 14:47   US RENAL Result Date: 10/06/2023 CLINICAL DATA:  "Hydronephrosis".  Lymphoma. EXAM: RENAL / URINARY TRACT ULTRASOUND COMPLETE COMPARISON:  CT 09/28/2023 FINDINGS: Right Kidney: Renal measurements: 10.1 x 4.8 x 6.0 cm = volume: 150 mL. Normal echogenicity. Mild hydronephrosis. Left Kidney: Renal measurements: 12.0 x 5.4 x  5.7 cm = volume: 182 mL. Normal echogenicity. Mild hydronephrosis. Bladder: Foley catheter within. Other: None. IMPRESSION: Similar mild bilateral hydronephrosis. Electronically Signed   By: Jeronimo Greaves M.D.   On: 10/06/2023 09:31   DG CHEST PORT 1 VIEW Result Date: 10/06/2023 CLINICAL DATA:  Pneumonia and shortness of breath EXAM: PORTABLE CHEST 1 VIEW COMPARISON:  09/28/2023 FINDINGS: Right Port-A-Cath tip at superior caval/atrial junction. Midline trachea. Patient rotated minimally right. Borderline cardiomegaly. Tortuous thoracic aorta. No pleural effusion or pneumothorax. Mild/subtle density projecting along the left heart border and lateral left hemidiaphragm. Right lung clear. IMPRESSION: Subtle density projecting along the left heart border and left hemidiaphragm. Favored to represent interval atelectasis. Given the clinical history, early or resolving pneumonia could look similar. Depending on clinical concern, consider radiographic follow-up at 5-7 days. Electronically Signed   By: Jeronimo Greaves M.D.   On: 10/06/2023 09:25   DG C-Arm 1-60 Min-No Report Result Date: 10/05/2023 Fluoroscopy was utilized by the requesting physician.  No radiographic interpretation.   DG OR UROLOGY CYSTO IMAGE ([facility] ONLY) Result Date: 10/05/2023 There is no interpretation for this exam.  This order is for images obtained during a surgical procedure.  Please See "Surgeries" Tab for more information regarding the procedure.   CT ABDOMEN PELVIS WO CONTRAST Result Date: 09/28/2023 CLINICAL DATA:  Lower extremity and groin swelling for 2 weeks with shortness of breath. Most recent PET-CT with extensive bulky abdominopelvic adenopathy. Currently undergoing XRT for lymphoma. EXAM: CT ABDOMEN AND PELVIS WITHOUT CONTRAST TECHNIQUE: Multidetector CT imaging of the abdomen and pelvis was performed following the  standard protocol without IV contrast. RADIATION DOSE REDUCTION: This exam was performed according to the  departmental dose-optimization program which includes automated exposure control, adjustment of the mA and/or kV according to patient size and/or use of iterative reconstruction technique. COMPARISON:  PET-CT 08/03/2023, CT chest, abdomen and pelvis with IV contrast 05/29/2023. FINDINGS: Lower chest: No acute abnormality. No lung nodules. The cardiac size is normal. The coronary blood pool is less dense than the myocardium consistent with anemia. No pericardial effusion. Hepatobiliary: There are several small scattered hepatic cysts, with no follow-up imaging required. No new liver abnormality is seen without contrast. The gallbladder and bile ducts are unremarkable. Pancreas: Unremarkable without contrast. Spleen: Unremarkable without contrast.  No splenomegaly. Adrenals/Urinary Tract: There is no adrenal mass. No contour deforming abnormality of either unenhanced kidneys. Interval new mild bilateral hydroureteronephrosis. There is no urinary stone. This is probably due to compressive effect related to extensive progressive bilateral retroperitoneal adenopathy. The bladder is not fully distended but does appear increasingly thickened, which could be due to cystitis, nondistention, or XRT. Stomach/Bowel: No dilatation or wall thickening is seen without contrast. There is mild-to-moderate fecal stasis. Diverticulosis is noted without focal colitis or diverticulitis. The appendix is normal. Vascular/Lymphatic: Mild aortic atherosclerosis. No AAA. Extensive progressive retroperitoneal adenopathy. There are multilevel bulky retroperitoneal lymph nodes. No adenopathy was previously seen along the aorta and IVC This is suboptimally evaluated without contrast. Example lymph nodes are as follows: New retrocaval lymph node measuring 2.7 x 1.9 cm on 2:27; New aortocaval lymph node measuring 3.2 x 3.4 cm on 2:30; Numerous other new lymph nodes around the between the IVC and aorta are also noted. There are multiple new left  periaortic chain nodes, largest of these is 5.4 x 3.2 cm on 2:36. There is extensive progressive adenopathy in the pelvis. Examples include: A right common iliac chain node measuring 4.9 x 3.9 cm on 2:46, was previously 3.3 x 2.0 cm; Bulky pelvic sidewall nodes with additional progressive lymph nodes in the more distal common iliac chains, for example a left common iliac chain lymph node is estimated 4.9 x 3.5 cm on 2:53, was previously 2.1 x 1.8 cm; There are bulky bilateral external iliac chain nodes. On the right the largest is 6.1 x 4.6 cm on 2:65, previously 6 x 4.5 cm; Largest of the left external iliac chain nodes is 6.2 x 4.9 cm on 2:61, previously was 5 x 3.7 cm. Bulky bilateral inguinal adenopathy is again noted, largest slightly improved on the right, now 5.6 x 4.0 cm on 2:88, previously was 5.7 x 4.6 cm. Largest left inguinal chain node is less dense than previously possibly due to XRT, today measuring 3.9 x 3.0 cm on 2:84, was previously 5.7 x 4.8 cm. Lymph nodes in medial upper thighs there also slightly improved, on the left measuring 3.6 x 3.0 cm on 2:110, previously 4.0 x 3.6 cm. At the same level on the right, a medial upper thigh lymph node measures 3.0 x 2.6 cm on 2:105, was previously 5.7 x 4.4 cm. 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 The prostate is not enlarged. Other: Minimal pelvic ascites. Diffuse worsening body wall anasarca which could be due to malnutrition, hepatic dysfunction, or fluid overload. There is severe penoscrotal edema, small hydroceles. Mild diffuse haziness to the mesentery is also noted but without focality. Musculoskeletal: Advanced degenerative change lumbar spine. Mild hip DJD. No metastatic bone lesion is seen. Ankylosis right SI joint. IMPRESSION: 1. Extensive progressive  retroperitoneal and pelvic adenopathy, with some slightly improved inguinal and medial upper thigh adenopathy. 2. Interval new mild bilateral  hydroureteronephrosis, probably due to compressive effect from new extensive retroperitoneal adenopathy. 3. Increasing bladder wall thickening which could be due to cystitis, nondistention, or XRT. 4. Constipation and diverticulosis. 5. Minimal pelvic ascites. 6. Worsening body wall anasarca which could be due to malnutrition, hepatic dysfunction, or fluid overload. 7. Severe penoscrotal edema, small hydroceles. 8. Aortic atherosclerosis. Aortic Atherosclerosis (ICD10-I70.0). Electronically Signed   By: Almira Bar M.D.   On: 09/28/2023 21:55   DG Chest Port 1 View Result Date: 09/28/2023 CLINICAL DATA:  Shortness of breath with history of lymphoma. Receiving XRT for lymphoma. EXAM: PORTABLE CHEST 1 VIEW COMPARISON:  Chest CT with contrast 05/29/2023 FINDINGS: Right IJ port catheter again terminates at about the superior cavoatrial junction. A skin fold overlies the outer right upper lung field simulating a pneumothorax. No pneumothorax is seen. The lungs are clear. The sulci are sharp. There is mild cardiomegaly. No vascular congestion is seen. Stable mediastinum with aortic tortuosity and atherosclerosis. DJD both shoulders with no new osseous findings. IMPRESSION: 1. No evidence of acute chest disease. 2. Mild cardiomegaly. 3. Aortic atherosclerosis. 4. Right IJ port catheter. Electronically Signed   By: Almira Bar M.D.   On: 09/28/2023 21:17      Subjective: Patient seen and examined at bedside today.  Hemodynamically stable lying in bed.  Appears overall comfortable.  On room air.  Denies any worsening shortness of breath or cough.  Has still significant bilateral lower EXTR edema.  Long discussion held at the bedside with patient and his wife about discharge planning to SNF today.  Discharge Exam: Vitals:   10/21/23 1958 10/22/23 0650  BP: 117/63 105/69  Pulse: 61 73  Resp: 17 16  Temp: 97.8 F (36.6 C) 97.7 F (36.5 C)  SpO2: 99% 98%   Vitals:   10/21/23 0924 10/21/23 1412  10/21/23 1958 10/22/23 0650  BP: (!) 86/51 122/68 117/63 105/69  Pulse: 78 60 61 73  Resp: 16 18 17 16   Temp: 98 F (36.7 C) 97.8 F (36.6 C) 97.8 F (36.6 C) 97.7 F (36.5 C)  TempSrc:   Oral Oral  SpO2: 97% 100% 99% 98%  Weight:      Height:        General: Pt is alert, awake, not in acute distress, weak and deconditioned Cardiovascular: RRR, S1/S2 +, no rubs, no gallops Respiratory: CTA bilaterally, no wheezing, no rhonchi Abdominal: Soft, NT, ND, bowel sounds + Extremities: Bilateral lower extremity pitting edema, no cyanosis    The results of significant diagnostics from this hospitalization (including imaging, microbiology, ancillary and laboratory) are listed below for reference.     Microbiology: Recent Results (from the past 240 hours)  Surgical pcr screen     Status: None   Collection Time: 10/19/23  2:02 AM   Specimen: Nasal Mucosa; Nasal Swab  Result Value Ref Range Status   MRSA, PCR NEGATIVE NEGATIVE Final   Staphylococcus aureus NEGATIVE NEGATIVE Final    Comment: (NOTE) The Xpert SA Assay (FDA approved for NASAL specimens in patients 62 years of age and older), is one component of a comprehensive surveillance program. It is not intended to diagnose infection nor to guide or monitor treatment. Performed at [facility], [address], [place], [place] [address]   Aerobic/Anaerobic Culture w Gram Stain (surgical/deep wound)     Status: None (Preliminary result)   Collection Time:  10/20/23 12:25 PM   Specimen: Urine, Random  Result Value Ref Range Status   Specimen Description URINE, RANDOM  Final   Special Requests right kidney  Final   Gram Stain   Final    RARE WBC SEEN FEW GRAM NEGATIVE RODS Performed at [facility], [address], [place], [place] [address]    Culture ABUNDANT GRAM NEGATIVE RODS  Final   Report Status PENDING  Incomplete     Labs: BNP (last 3 results) Recent Labs    12/10/22 0701 09/28/23 2038   BNP 84.1 44.2   Basic Metabolic Panel: Recent Labs  Lab 10/16/23 0329 10/17/23 0312 10/18/23 0400 10/19/23 0333 10/20/23 0258 10/21/23 0255 10/22/23 0239  NA 136   < > 132* 133*  133* 132*  130* 135 134*  K 4.1   < > 4.1 4.3  4.3 4.5  4.3 4.0 4.1  CL 105   < > 101 102  102 100  100 103 103  CO2 22   < > 21* 19*  19* 19*  18* 20* 20*  GLUCOSE 97   < > 90 96  97 100*  105* 115* 98  BUN 52*   < > 76* 82*  82* 89*  89* 97* 84*  CREATININE 1.90*   < > 2.69* 2.41*  2.35* 3.10*  3.21* 2.96* 2.30*  CALCIUM 8.9   < > 9.0 9.3  9.3 9.1  8.9 8.7* 8.7*  MG 1.9  --  2.1  --  2.3  --  2.6*  PHOS 2.9  --  3.9 4.3 4.7*  5.0* 4.9* 4.4   < > = values in this interval not displayed.   Liver Function Tests: Recent Labs  Lab 10/19/23 0333 10/20/23 0258 10/21/23 0255  ALBUMIN 3.2* 3.0* 2.6*   No results for input(s): "LIPASE", "AMYLASE" in the last 168 hours. No results for input(s): "AMMONIA" in the last 168 hours. CBC: Recent Labs  Lab 10/16/23 0329 10/18/23 0400 10/19/23 0333 10/20/23 0258 10/22/23 0239  WBC 8.1 6.5 9.2 10.7* 8.2  NEUTROABS  --   --  7.7  --   --   HGB 8.9* 9.0* 9.1* 8.6* 8.1*  HCT 27.6* 27.7* 27.4* 25.7* 25.4*  MCV 89.6 89.4 89.3 88.3 90.7  PLT 69* 72* 68* 60* 51*   Cardiac Enzymes: No results for input(s): "CKTOTAL", "CKMB", "CKMBINDEX", "TROPONINI" in the last 168 hours. BNP: Invalid input(s): "POCBNP" CBG: Recent Labs  Lab 10/17/23 0009  GLUCAP 110*   D-Dimer No results for input(s): "DDIMER" in the last 72 hours. Hgb A1c No results for input(s): "HGBA1C" in the last 72 hours. Lipid Profile No results for input(s): "CHOL", "HDL", "LDLCALC", "TRIG", "CHOLHDL", "LDLDIRECT" in the last 72 hours. Thyroid function studies No results for input(s): "TSH", "T4TOTAL", "T3FREE", "THYROIDAB" in the last 72 hours.  Invalid input(s): "FREET3" Anemia work up No results for input(s): "VITAMINB12", "FOLATE", "FERRITIN", "TIBC", "IRON",  "RETICCTPCT" in the last 72 hours. Urinalysis    Component Value Date/Time   COLORURINE BROWN (A) 10/18/2023 1757   APPEARANCEUR CLOUDY (A) 10/18/2023 1757   LABSPEC 1.015 10/18/2023 1757   PHURINE 6.0 10/18/2023 1757   GLUCOSEU NEGATIVE 10/18/2023 1757   HGBUR MODERATE (A) 10/18/2023 1757   BILIRUBINUR NEGATIVE 10/18/2023 1757   KETONESUR NEGATIVE 10/18/2023 1757   PROTEINUR 100 (A) 10/18/2023 1757   NITRITE NEGATIVE 10/18/2023 1757   LEUKOCYTESUR SMALL (A) 10/18/2023 1757   Sepsis Labs Recent Labs  Lab 10/18/23 0400 10/19/23 6301  10/20/23 0258 10/22/23 0239  WBC 6.5 9.2 10.7* 8.2   Microbiology Recent Results (from the past 240 hours)  Surgical pcr screen     Status: None   Collection Time: 10/19/23  2:02 AM   Specimen: Nasal Mucosa; Nasal Swab  Result Value Ref Range Status   MRSA, PCR NEGATIVE NEGATIVE Final   Staphylococcus aureus NEGATIVE NEGATIVE Final    Comment: (NOTE) The Xpert SA Assay (FDA approved for NASAL specimens in patients 50 years of age and older), is one component of a comprehensive surveillance program. It is not intended to diagnose infection nor to guide or monitor treatment. Performed at [facility], [address], [place], [place] [address]   Aerobic/Anaerobic Culture w Gram Stain (surgical/deep wound)     Status: None (Preliminary result)   Collection Time: 10/20/23 12:25 PM   Specimen: Urine, Random  Result Value Ref Range Status   Specimen Description URINE, RANDOM  Final   Special Requests right kidney  Final   Gram Stain   Final    RARE WBC SEEN FEW GRAM NEGATIVE RODS Performed at [facility], [address], [place], [place] [address]    Culture Hosie Poisson NEGATIVE RODS  Final   Report Status PENDING  Incomplete    Please note: You were cared for by a hospitalist during your hospital stay. Once you are discharged, your primary care physician will handle any further medical issues. Please note that  NO REFILLS for any discharge medications will be authorized once you are discharged, as it is imperative that you return to your primary care physician (or establish a relationship with a primary care physician if you do not have one) for your post hospital discharge needs so that they can reassess your need for medications and monitor your lab values.    Time coordinating discharge: 40 minutes  SIGNED:   Burnadette Pop, MD  [facility] 10/22/2023, 11:57 AM Pager [phone]  If 7PM-7AM, please contact night-coverage [email or web address] Password [email or web address]

## 2023-10-22 NOTE — Progress Notes (Signed)
Patient being discharged to [facility], report given to receiving RN, Brayton Caves

## 2023-10-22 NOTE — TOC Transition Note (Signed)
Transition of Care [facility]) - Discharge Note  Patient Details  Name: Jerry Tran MRN: [id number] Date of Birth: 1946/07/31  [facility] CM/SW Contact:  Ewing Schlein, LCSW Phone Number: 10/22/2023, 12:19 PM  Clinical Narrative: CSW confirmed admission today with Gavin Pound at [facility]. Discharge summary, discharge orders, and SNF transfer report faxed to facility in hub. Medical necessity form done; PTAR scheduled. Discharge packet completed. CSW notified patient and spouse, [name], regarding transportation being set up. RN updated. TOC signing off.  Final next level of care: Skilled Nursing Facility Barriers to Discharge: Barriers Resolved  Patient Goals and CMS Choice Patient states their goals for this hospitalization and ongoing recovery are:: SNF to get stronger CMS Medicare.gov Compare Post Acute Care list provided to:: Patient Choice offered to / list presented to : Patient  Discharge Placement PASRR number recieved: 10/19/23      Patient chooses bed at: [facility] Patient to be transferred to facility by: PTAR Name of family member notified: [name] (spouse) Patient and family notified of of transfer: 10/22/23  Discharge Plan and Services Additional resources added to the After Visit Summary for         DME Arranged: N/A DME Agency: NA  Social Drivers of Health (SDOH) Interventions SDOH Screenings   Food Insecurity: No Food Insecurity (09/29/2023)  Housing: Low Risk  (09/29/2023)  Transportation Needs: No Transportation Needs (09/29/2023)  Utilities: Not At Risk (09/29/2023)  Alcohol Screen: Low Risk  (08/08/2023)  Depression (PHQ2-9): Low Risk  (04/03/2023)  Tobacco Use: Medium Risk (10/05/2023)  Health Literacy: Adequate Health Literacy (08/08/2023)   Readmission Risk Interventions     No data to display

## 2023-10-25 ENCOUNTER — Other Ambulatory Visit (HOSPITAL_COMMUNITY): Payer: Self-pay | Admitting: Urology

## 2023-10-25 DIAGNOSIS — N131 Hydronephrosis with ureteral stricture, not elsewhere classified: Secondary | ICD-10-CM

## 2023-10-25 LAB — AEROBIC/ANAEROBIC CULTURE W GRAM STAIN (SURGICAL/DEEP WOUND)

## 2023-10-26 ENCOUNTER — Other Ambulatory Visit: Payer: Self-pay

## 2023-11-01 ENCOUNTER — Inpatient Hospital Stay: Payer: No Typology Code available for payment source

## 2023-11-01 ENCOUNTER — Emergency Department: Payer: No Typology Code available for payment source

## 2023-11-01 ENCOUNTER — Other Ambulatory Visit: Payer: Self-pay

## 2023-11-01 DIAGNOSIS — Z888 Allergy status to other drugs, medicaments and biological substances status: Secondary | ICD-10-CM

## 2023-11-01 DIAGNOSIS — Z7952 Long term (current) use of systemic steroids: Secondary | ICD-10-CM

## 2023-11-01 DIAGNOSIS — E785 Hyperlipidemia, unspecified: Secondary | ICD-10-CM | POA: Diagnosis present

## 2023-11-01 DIAGNOSIS — E871 Hypo-osmolality and hyponatremia: Secondary | ICD-10-CM | POA: Diagnosis present

## 2023-11-01 DIAGNOSIS — R4020 Unspecified coma: Secondary | ICD-10-CM | POA: Diagnosis present

## 2023-11-01 DIAGNOSIS — E8721 Acute metabolic acidosis: Secondary | ICD-10-CM | POA: Diagnosis present

## 2023-11-01 DIAGNOSIS — Z79899 Other long term (current) drug therapy: Secondary | ICD-10-CM

## 2023-11-01 DIAGNOSIS — Z515 Encounter for palliative care: Secondary | ICD-10-CM | POA: Diagnosis not present

## 2023-11-01 DIAGNOSIS — R4182 Altered mental status, unspecified: Secondary | ICD-10-CM

## 2023-11-01 DIAGNOSIS — I480 Paroxysmal atrial fibrillation: Secondary | ICD-10-CM | POA: Diagnosis present

## 2023-11-01 DIAGNOSIS — I82403 Acute embolism and thrombosis of unspecified deep veins of lower extremity, bilateral: Secondary | ICD-10-CM | POA: Diagnosis present

## 2023-11-01 DIAGNOSIS — D61818 Other pancytopenia: Secondary | ICD-10-CM | POA: Diagnosis present

## 2023-11-01 DIAGNOSIS — D62 Acute posthemorrhagic anemia: Secondary | ICD-10-CM | POA: Diagnosis present

## 2023-11-01 DIAGNOSIS — I5032 Chronic diastolic (congestive) heart failure: Secondary | ICD-10-CM | POA: Diagnosis present

## 2023-11-01 DIAGNOSIS — C8310 Mantle cell lymphoma, unspecified site: Secondary | ICD-10-CM | POA: Diagnosis present

## 2023-11-01 DIAGNOSIS — G9341 Metabolic encephalopathy: Secondary | ICD-10-CM | POA: Diagnosis present

## 2023-11-01 DIAGNOSIS — Z9221 Personal history of antineoplastic chemotherapy: Secondary | ICD-10-CM

## 2023-11-01 DIAGNOSIS — I130 Hypertensive heart and chronic kidney disease with heart failure and stage 1 through stage 4 chronic kidney disease, or unspecified chronic kidney disease: Secondary | ICD-10-CM | POA: Diagnosis present

## 2023-11-01 DIAGNOSIS — J9601 Acute respiratory failure with hypoxia: Secondary | ICD-10-CM | POA: Diagnosis present

## 2023-11-01 DIAGNOSIS — E875 Hyperkalemia: Secondary | ICD-10-CM | POA: Diagnosis present

## 2023-11-01 DIAGNOSIS — Z923 Personal history of irradiation: Secondary | ICD-10-CM

## 2023-11-01 DIAGNOSIS — N1832 Chronic kidney disease, stage 3b: Secondary | ICD-10-CM | POA: Diagnosis present

## 2023-11-01 DIAGNOSIS — N179 Acute kidney failure, unspecified: Secondary | ICD-10-CM | POA: Diagnosis present

## 2023-11-01 DIAGNOSIS — Z66 Do not resuscitate: Secondary | ICD-10-CM | POA: Diagnosis not present

## 2023-11-01 DIAGNOSIS — R570 Cardiogenic shock: Principal | ICD-10-CM | POA: Diagnosis present

## 2023-11-01 DIAGNOSIS — Q2112 Patent foramen ovale: Secondary | ICD-10-CM | POA: Diagnosis not present

## 2023-11-01 DIAGNOSIS — R319 Hematuria, unspecified: Secondary | ICD-10-CM | POA: Diagnosis present

## 2023-11-01 DIAGNOSIS — Z1152 Encounter for screening for COVID-19: Secondary | ICD-10-CM

## 2023-11-01 DIAGNOSIS — R7989 Other specified abnormal findings of blood chemistry: Secondary | ICD-10-CM | POA: Diagnosis present

## 2023-11-01 DIAGNOSIS — N189 Chronic kidney disease, unspecified: Secondary | ICD-10-CM

## 2023-11-01 DIAGNOSIS — R579 Shock, unspecified: Secondary | ICD-10-CM | POA: Diagnosis present

## 2023-11-01 DIAGNOSIS — I2699 Other pulmonary embolism without acute cor pulmonale: Secondary | ICD-10-CM | POA: Diagnosis present

## 2023-11-01 DIAGNOSIS — Z7401 Bed confinement status: Secondary | ICD-10-CM

## 2023-11-01 DIAGNOSIS — E883 Tumor lysis syndrome: Secondary | ICD-10-CM | POA: Diagnosis present

## 2023-11-01 DIAGNOSIS — Z8546 Personal history of malignant neoplasm of prostate: Secondary | ICD-10-CM

## 2023-11-01 DIAGNOSIS — F17210 Nicotine dependence, cigarettes, uncomplicated: Secondary | ICD-10-CM | POA: Diagnosis present

## 2023-11-01 DIAGNOSIS — K219 Gastro-esophageal reflux disease without esophagitis: Secondary | ICD-10-CM | POA: Diagnosis present

## 2023-11-01 DIAGNOSIS — E162 Hypoglycemia, unspecified: Secondary | ICD-10-CM | POA: Diagnosis present

## 2023-11-01 DIAGNOSIS — C8318 Mantle cell lymphoma, lymph nodes of multiple sites: Principal | ICD-10-CM

## 2023-11-01 DIAGNOSIS — E790 Hyperuricemia without signs of inflammatory arthritis and tophaceous disease: Secondary | ICD-10-CM | POA: Diagnosis present

## 2023-11-01 DIAGNOSIS — I21A1 Myocardial infarction type 2: Secondary | ICD-10-CM | POA: Diagnosis present

## 2023-11-01 LAB — BLOOD GAS, ARTERIAL
Acid-base deficit: 22.5 mmol/L — ABNORMAL HIGH (ref 0.0–2.0)
Acid-base deficit: 16.2 mmol/L — ABNORMAL HIGH (ref 0.0–2.0)
Bicarbonate: 10.1 mmol/L — ABNORMAL LOW (ref 20.0–28.0)
Bicarbonate: 12.7 mmol/L — ABNORMAL LOW (ref 20.0–28.0)
FIO2: 1 %
FIO2: 50 %
MECHVT: 500 mL
MECHVT: 500 mL
Mechanical Rate: 16
O2 Saturation: 99.9 %
O2 Saturation: 99.5 %
PEEP: 5 cmH2O
PEEP: 5 cmH2O
Patient temperature: 37
Patient temperature: 37
RATE: 16 {breaths}/min
pCO2 arterial: 49 mm[Hg] — ABNORMAL HIGH (ref 32–48)
pCO2 arterial: 40 mm[Hg] (ref 32–48)
pH, Arterial: <6.95 — CL (ref 7.35–7.45)
pH, Arterial: 7.11 — CL (ref 7.35–7.45)
pO2, Arterial: 401 mm[Hg] — ABNORMAL HIGH (ref 83–108)
pO2, Arterial: 161 mm[Hg] — ABNORMAL HIGH (ref 83–108)

## 2023-11-01 LAB — RESP PANEL BY RT-PCR (RSV, FLU A&B, COVID)  RVPGX2
Influenza A by PCR: NEGATIVE
Influenza B by PCR: NEGATIVE
Resp Syncytial Virus by PCR: NEGATIVE
SARS Coronavirus 2 by RT PCR: NEGATIVE

## 2023-11-01 LAB — CBC WITH DIFFERENTIAL/PLATELET
Abs Immature Granulocytes: 0 10*3/uL (ref 0.00–0.07)
Basophils Absolute: 0 10*3/uL (ref 0.0–0.1)
Basophils Relative: 0 %
Eosinophils Absolute: 0 10*3/uL (ref 0.0–0.5)
Eosinophils Relative: 0 %
HCT: 25 % — ABNORMAL LOW (ref 39.0–52.0)
Hemoglobin: 7.5 g/dL — ABNORMAL LOW (ref 13.0–17.0)
Immature Granulocytes: 0 %
Lymphocytes Relative: 33 %
Lymphs Abs: 0.3 10*3/uL — ABNORMAL LOW (ref 0.7–4.0)
MCH: 29 pg (ref 26.0–34.0)
MCHC: 30 g/dL (ref 30.0–36.0)
MCV: 96.5 fL (ref 80.0–100.0)
Monocytes Absolute: 0.1 10*3/uL (ref 0.1–1.0)
Monocytes Relative: 7 %
Neutro Abs: 0.5 10*3/uL — ABNORMAL LOW (ref 1.7–7.7)
Neutrophils Relative %: 60 %
Platelets: 55 10*3/uL — ABNORMAL LOW (ref 150–400)
RBC: 2.59 MIL/uL — ABNORMAL LOW (ref 4.22–5.81)
RDW: 18.4 % — ABNORMAL HIGH (ref 11.5–15.5)
Smear Review: NORMAL
WBC: 0.8 10*3/uL — CL (ref 4.0–10.5)
nRBC: 2.6 % — ABNORMAL HIGH (ref 0.0–0.2)

## 2023-11-01 LAB — BASIC METABOLIC PANEL
Anion gap: 19 — ABNORMAL HIGH (ref 5–15)
BUN: 91 mg/dL — ABNORMAL HIGH (ref 8–23)
CO2: 11 mmol/L — ABNORMAL LOW (ref 22–32)
Calcium: 7.5 mg/dL — ABNORMAL LOW (ref 8.9–10.3)
Chloride: 95 mmol/L — ABNORMAL LOW (ref 98–111)
Creatinine, Ser: 4.44 mg/dL — ABNORMAL HIGH (ref 0.61–1.24)
GFR, Estimated: 13 mL/min — ABNORMAL LOW (ref >60)
Glucose, Bld: 175 mg/dL — ABNORMAL HIGH (ref 70–99)
Potassium: 5.6 mmol/L — ABNORMAL HIGH (ref 3.5–5.1)
Sodium: 125 mmol/L — ABNORMAL LOW (ref 135–145)

## 2023-11-01 LAB — COMPREHENSIVE METABOLIC PANEL
ALT: 18 U/L (ref 0–44)
AST: 85 U/L — ABNORMAL HIGH (ref 15–41)
Albumin: 2.4 g/dL — ABNORMAL LOW (ref 3.5–5.0)
Alkaline Phosphatase: 50 U/L (ref 38–126)
Anion gap: 22 — ABNORMAL HIGH (ref 5–15)
BUN: 92 mg/dL — ABNORMAL HIGH (ref 8–23)
CO2: 10 mmol/L — ABNORMAL LOW (ref 22–32)
Calcium: 8.1 mg/dL — ABNORMAL LOW (ref 8.9–10.3)
Chloride: 96 mmol/L — ABNORMAL LOW (ref 98–111)
Creatinine, Ser: 4.66 mg/dL — ABNORMAL HIGH (ref 0.61–1.24)
GFR, Estimated: 12 mL/min — ABNORMAL LOW (ref >60)
Glucose, Bld: 167 mg/dL — ABNORMAL HIGH (ref 70–99)
Potassium: 7 mmol/L (ref 3.5–5.1)
Sodium: 128 mmol/L — ABNORMAL LOW (ref 135–145)
Total Bilirubin: 1 mg/dL (ref 0.0–1.2)
Total Protein: 4.7 g/dL — ABNORMAL LOW (ref 6.5–8.1)

## 2023-11-01 LAB — CORTISOL: Cortisol, Plasma: 67.7 ug/dL

## 2023-11-01 LAB — D-DIMER, QUANTITATIVE: D-Dimer, Quant: 17 ug{FEU}/mL — ABNORMAL HIGH (ref 0.00–0.50)

## 2023-11-01 LAB — CBG MONITORING, ED
Glucose-Capillary: 157 mg/dL — ABNORMAL HIGH (ref 70–99)
Glucose-Capillary: <10 mg/dL — CL (ref 70–99)
Glucose-Capillary: 89 mg/dL (ref 70–99)
Glucose-Capillary: 122 mg/dL — ABNORMAL HIGH (ref 70–99)
Glucose-Capillary: 284 mg/dL — ABNORMAL HIGH (ref 70–99)

## 2023-11-01 LAB — PROCALCITONIN: Procalcitonin: >150 ng/mL

## 2023-11-01 LAB — TROPONIN I (HIGH SENSITIVITY)
Troponin I (High Sensitivity): 711 ng/L (ref <18)
Troponin I (High Sensitivity): 913 ng/L (ref <18)

## 2023-11-01 LAB — URIC ACID: Uric Acid, Serum: 11.8 mg/dL — ABNORMAL HIGH (ref 3.7–8.6)

## 2023-11-01 LAB — PROTIME-INR
INR: 1.7 — ABNORMAL HIGH (ref 0.8–1.2)
Prothrombin Time: 20.4 s — ABNORMAL HIGH (ref 11.4–15.2)

## 2023-11-01 LAB — APTT: aPTT: 36 s (ref 24–36)

## 2023-11-01 LAB — GLUCOSE, CAPILLARY: Glucose-Capillary: 196 mg/dL — ABNORMAL HIGH (ref 70–99)

## 2023-11-01 LAB — LACTATE DEHYDROGENASE: LDH: 988 U/L — ABNORMAL HIGH (ref 98–192)

## 2023-11-01 LAB — FIBRINOGEN: Fibrinogen: 294 mg/dL (ref 210–475)

## 2023-11-01 LAB — PATHOLOGIST SMEAR REVIEW

## 2023-11-01 LAB — LACTIC ACID, PLASMA
Lactic Acid, Venous: 8.4 mmol/L (ref 0.5–1.9)
Lactic Acid, Venous: 8 mmol/L (ref 0.5–1.9)

## 2023-11-01 LAB — PHOSPHORUS: Phosphorus: 9.5 mg/dL — ABNORMAL HIGH (ref 2.5–4.6)

## 2023-11-01 MED ORDER — MORPHINE SULFATE (PF) 4 MG/ML IV SOLN: 6.0000 mg | INTRAVENOUS | Status: DC | PRN

## 2023-11-01 MED ORDER — DEXTROSE 50 % IV SOLN
INTRAVENOUS | Status: AC | PRN
  Administered 2023-11-01: 1 via INTRAVENOUS

## 2023-11-01 MED ORDER — ACETAMINOPHEN 325 MG PO TABS: 650.0000 mg | ORAL_TABLET | Freq: Four times a day (QID) | ORAL | Status: DC | PRN

## 2023-11-01 MED ORDER — FENTANYL CITRATE PF 50 MCG/ML IJ SOSY
25.0000 ug | PREFILLED_SYRINGE | INTRAMUSCULAR | Status: DC | PRN
  Administered 2023-11-01: 25 ug via INTRAVENOUS
  Filled 2023-11-01: qty 1

## 2023-11-01 MED ORDER — DOCUSATE SODIUM 50 MG/5ML PO LIQD: 100.0000 mg | Freq: Two times a day (BID) | ORAL | Status: DC

## 2023-11-01 MED ORDER — ROCURONIUM BROMIDE 10 MG/ML (PF) SYRINGE
PREFILLED_SYRINGE | INTRAVENOUS | Status: AC
  Filled 2023-11-01: qty 10

## 2023-11-01 MED ORDER — HEPARIN SODIUM (PORCINE) 1000 UNIT/ML DIALYSIS
1000.0000 [IU] | INTRAMUSCULAR | Status: DC | PRN
  Filled 2023-11-01: qty 6

## 2023-11-01 MED ORDER — INSULIN ASPART 100 UNIT/ML IJ SOLN
10.0000 [IU] | Freq: Once | INTRAMUSCULAR | Status: AC
  Administered 2023-11-01: 10 [IU] via INTRAVENOUS
  Filled 2023-11-01: qty 1

## 2023-11-01 MED ORDER — MORPHINE BOLUS VIA INFUSION: 5.0000 mg | INTRAVENOUS | Status: DC | PRN

## 2023-11-01 MED ORDER — ROCURONIUM BROMIDE 10 MG/ML (PF) SYRINGE
PREFILLED_SYRINGE | INTRAVENOUS | Status: AC | PRN
  Administered 2023-11-01: 100 mg via INTRAVENOUS

## 2023-11-01 MED ORDER — POLYVINYL ALCOHOL 1.4 % OP SOLN: 1.0000 [drp] | Freq: Four times a day (QID) | OPHTHALMIC | Status: DC | PRN

## 2023-11-01 MED ORDER — GLYCOPYRROLATE 1 MG PO TABS: 1.0000 mg | ORAL_TABLET | ORAL | Status: DC | PRN

## 2023-11-01 MED ORDER — AMIODARONE LOAD VIA INFUSION: 150.0000 mg | Freq: Once | INTRAVENOUS | Status: DC

## 2023-11-01 MED ORDER — SODIUM CHLORIDE 0.9 % IV SOLN
2.0000 g | INTRAVENOUS | Status: DC
  Administered 2023-11-01: 2 g via INTRAVENOUS
  Filled 2023-11-01: qty 12.5

## 2023-11-01 MED ORDER — VANCOMYCIN HCL 10 G IV SOLR: 2500.0000 mg | Freq: Once | INTRAVENOUS | Status: DC

## 2023-11-01 MED ORDER — AMIODARONE HCL IN DEXTROSE 360-4.14 MG/200ML-% IV SOLN: 60.0000 mg/h | INTRAVENOUS | Status: DC

## 2023-11-01 MED ORDER — VANCOMYCIN HCL IN DEXTROSE 1-5 GM/200ML-% IV SOLN: 1000.0000 mg | Freq: Once | INTRAVENOUS | Status: DC

## 2023-11-01 MED ORDER — SODIUM CHLORIDE 0.9 % IV BOLUS
2000.0000 mL | Freq: Once | INTRAVENOUS | Status: AC
  Administered 2023-11-01: 2000 mL via INTRAVENOUS

## 2023-11-01 MED ORDER — NOREPINEPHRINE 4 MG/250ML-% IV SOLN
INTRAVENOUS | Status: AC
  Filled 2023-11-01: qty 250

## 2023-11-01 MED ORDER — MORPHINE 100MG IN NS 100ML (1MG/ML) PREMIX INFUSION
0.0000 mg/h | INTRAVENOUS | Status: DC
  Administered 2023-11-01: 5 mg/h via INTRAVENOUS

## 2023-11-01 MED ORDER — AMIODARONE HCL IN DEXTROSE 360-4.14 MG/200ML-% IV SOLN
30.0000 mg/h | INTRAVENOUS | Status: DC
Start: 2023-11-01 — End: 2023-11-01

## 2023-11-01 MED ORDER — NOREPINEPHRINE 4 MG/250ML-% IV SOLN
INTRAVENOUS | Status: DC | PRN
  Administered 2023-11-01: 40 ug/min via INTRAVENOUS

## 2023-11-01 MED ORDER — VANCOMYCIN HCL 1500 MG/300ML IV SOLN
1500.0000 mg | Freq: Once | INTRAVENOUS | Status: AC
  Administered 2023-11-01: 1500 mg via INTRAVENOUS
  Filled 2023-11-01: qty 300

## 2023-11-01 MED ORDER — FENTANYL CITRATE PF 50 MCG/ML IJ SOSY
25.0000 ug | PREFILLED_SYRINGE | INTRAMUSCULAR | Status: DC | PRN
  Administered 2023-11-01: 50 ug via INTRAVENOUS
  Administered 2023-11-01: 100 ug via INTRAVENOUS
  Filled 2023-11-01: qty 1
  Filled 2023-11-01: qty 2

## 2023-11-01 MED ORDER — PRISMASOL BGK 2/3.5 32-2-3.5 MEQ/L EC SOLN
Status: DC
  Filled 2023-11-01: qty 5000

## 2023-11-01 MED ORDER — AMIODARONE HCL IN DEXTROSE 360-4.14 MG/200ML-% IV SOLN
60.0000 mg/h | INTRAVENOUS | Status: DC
Start: 2023-11-01 — End: 2023-11-01

## 2023-11-01 MED ORDER — SODIUM CHLORIDE 0.9 % IV SOLN
6.0000 mg | Freq: Once | INTRAVENOUS | Status: DC
  Filled 2023-11-01: qty 4

## 2023-11-01 MED ORDER — AMIODARONE LOAD VIA INFUSION
150.0000 mg | Freq: Once | INTRAVENOUS | Status: DC
  Filled 2023-11-01: qty 83.34

## 2023-11-01 MED ORDER — LACTATED RINGERS IV SOLN: INTRAVENOUS | Status: DC

## 2023-11-01 MED ORDER — NOREPINEPHRINE 16 MG/250ML-% IV SOLN
0.0000 ug/min | INTRAVENOUS | Status: DC
  Administered 2023-11-01: 60 ug/min via INTRAVENOUS
  Filled 2023-11-01 (×3): qty 250

## 2023-11-01 MED ORDER — DEXTROSE 5 % IV SOLN
INTRAVENOUS | Status: DC
  Filled 2023-11-01: qty 1000
  Filled 2023-11-01: qty 150
  Filled 2023-11-01: qty 1000

## 2023-11-01 MED ORDER — SODIUM CHLORIDE 0.9 % IV SOLN
2.0000 g | Freq: Once | INTRAVENOUS | Status: AC
  Administered 2023-11-01: 2 g via INTRAVENOUS
  Filled 2023-11-01: qty 20

## 2023-11-01 MED ORDER — MIDAZOLAM HCL 2 MG/2ML IJ SOLN
1.0000 mg | INTRAMUSCULAR | Status: DC | PRN
  Administered 2023-11-01: 2 mg via INTRAVENOUS
  Filled 2023-11-01 (×2): qty 2

## 2023-11-01 MED ORDER — LACTATED RINGERS IV BOLUS (SEPSIS): 1000.0000 mL | Freq: Once | INTRAVENOUS | Status: DC

## 2023-11-01 MED ORDER — SODIUM CHLORIDE 0.9 % IV SOLN: INTRAVENOUS | Status: DC

## 2023-11-01 MED ORDER — AMIODARONE HCL IN DEXTROSE 360-4.14 MG/200ML-% IV SOLN: 30.0000 mg/h | INTRAVENOUS | Status: DC

## 2023-11-01 MED ORDER — KETAMINE HCL 10 MG/ML IJ SOLN
INTRAMUSCULAR | Status: AC
  Filled 2023-11-01: qty 1

## 2023-11-01 MED ORDER — SODIUM CHLORIDE 0.9 % IV SOLN
100.0000 mg | INTRAVENOUS | Status: DC
  Filled 2023-11-01: qty 5

## 2023-11-01 MED ORDER — SODIUM CHLORIDE 0.9 % IV SOLN
500.0000 mg | Freq: Once | INTRAVENOUS | Status: AC
  Administered 2023-11-01: 500 mg via INTRAVENOUS
  Filled 2023-11-01: qty 5

## 2023-11-01 MED ORDER — VASOPRESSIN 20 UNITS/100 ML INFUSION FOR SHOCK
0.0000 [IU]/min | INTRAVENOUS | Status: DC
  Administered 2023-11-01 (×2): 0.04 [IU]/min via INTRAVENOUS
  Filled 2023-11-01 (×3): qty 100

## 2023-11-01 MED ORDER — MORPHINE 100MG IN NS 100ML (1MG/ML) PREMIX INFUSION
10.0000 mg/h | INTRAVENOUS | Status: DC
Start: 2023-11-01 — End: 2023-11-01
  Administered 2023-11-01: 10 mg/h via INTRAVENOUS
  Filled 2023-11-01: qty 100

## 2023-11-01 MED ORDER — KETAMINE HCL 10 MG/ML IJ SOLN
INTRAMUSCULAR | Status: AC | PRN
  Administered 2023-11-01: 100 mg via INTRAVENOUS

## 2023-11-01 MED ORDER — MORPHINE SULFATE (PF) 2 MG/ML IV SOLN: 1.0000 mg | INTRAVENOUS | Status: DC | PRN

## 2023-11-01 MED ORDER — NOREPINEPHRINE 4 MG/250ML-% IV SOLN
0.0000 ug/min | INTRAVENOUS | Status: DC
  Administered 2023-11-01: 60 ug/min via INTRAVENOUS
  Filled 2023-11-01: qty 250

## 2023-11-01 MED ORDER — GLYCOPYRROLATE 0.2 MG/ML IJ SOLN: 0.2000 mg | INTRAMUSCULAR | Status: DC | PRN

## 2023-11-01 MED ORDER — DEXTROSE 50 % IV SOLN
INTRAVENOUS | Status: AC
  Filled 2023-11-01: qty 50

## 2023-11-01 MED ORDER — CALCIUM GLUCONATE-NACL 1-0.675 GM/50ML-% IV SOLN
1.0000 g | Freq: Once | INTRAVENOUS | Status: AC
  Administered 2023-11-01: 1000 mg via INTRAVENOUS
  Filled 2023-11-01: qty 50

## 2023-11-01 MED ORDER — HYDROCORTISONE SOD SUC (PF) 100 MG IJ SOLR
50.0000 mg | Freq: Four times a day (QID) | INTRAMUSCULAR | Status: DC
  Administered 2023-11-01: 50 mg via INTRAVENOUS
  Filled 2023-11-01 (×2): qty 1

## 2023-11-01 MED ORDER — LACTATED RINGERS IV BOLUS (SEPSIS)
1000.0000 mL | Freq: Once | INTRAVENOUS | Status: AC
  Administered 2023-11-01: 1000 mL via INTRAVENOUS

## 2023-11-01 MED ORDER — GLYCOPYRROLATE 0.2 MG/ML IJ SOLN
0.2000 mg | INTRAMUSCULAR | Status: DC | PRN
Start: 2023-11-01 — End: 2023-11-02
  Administered 2023-11-01: 0.2 mg via INTRAVENOUS
  Filled 2023-11-01: qty 1

## 2023-11-01 MED ORDER — SODIUM ZIRCONIUM CYCLOSILICATE 5 G PO PACK
10.0000 g | PACK | Freq: Once | ORAL | Status: DC
  Filled 2023-11-01: qty 1

## 2023-11-01 MED ORDER — DOCUSATE SODIUM 100 MG PO CAPS: 100.0000 mg | ORAL_CAPSULE | Freq: Two times a day (BID) | ORAL | Status: DC | PRN

## 2023-11-01 MED ORDER — MIDAZOLAM HCL 2 MG/2ML IJ SOLN
2.0000 mg | INTRAMUSCULAR | Status: DC | PRN
  Administered 2023-11-01: 2 mg via INTRAVENOUS

## 2023-11-01 MED ORDER — LACTATED RINGERS IV BOLUS (SEPSIS): 500.0000 mL | Freq: Once | INTRAVENOUS | Status: DC

## 2023-11-01 MED ORDER — PROPOFOL 1000 MG/100ML IV EMUL
INTRAVENOUS | Status: AC
  Filled 2023-11-01: qty 100

## 2023-11-01 MED ORDER — HEPARIN (PORCINE) 25000 UT/250ML-% IV SOLN: 1400.0000 [IU]/h | INTRAVENOUS | Status: DC

## 2023-11-01 MED ORDER — SODIUM BICARBONATE 8.4 % IV SOLN
100.0000 meq | Freq: Once | INTRAVENOUS | Status: AC
  Administered 2023-11-01: 100 meq via INTRAVENOUS
  Filled 2023-11-01: qty 50

## 2023-11-01 MED ORDER — POLYETHYLENE GLYCOL 3350 17 G PO PACK: 17.0000 g | PACK | Freq: Every day | ORAL | Status: DC

## 2023-11-01 MED ORDER — ACETAMINOPHEN 650 MG RE SUPP: 650.0000 mg | Freq: Four times a day (QID) | RECTAL | Status: DC | PRN

## 2023-11-01 MED ORDER — CHLORHEXIDINE GLUCONATE CLOTH 2 % EX PADS
6.0000 | MEDICATED_PAD | Freq: Every day | CUTANEOUS | Status: DC
  Administered 2023-11-01: 6 via TOPICAL

## 2023-11-01 MED ORDER — DEXTROSE 50 % IV SOLN
1.0000 | Freq: Once | INTRAVENOUS | Status: AC
  Administered 2023-11-01: 50 mL via INTRAVENOUS
  Filled 2023-11-01: qty 50

## 2023-11-01 MED ORDER — DEXTROSE 10 % IV SOLN: INTRAVENOUS | Status: DC

## 2023-11-01 MED ORDER — PANTOPRAZOLE SODIUM 40 MG IV SOLR: 40.0000 mg | Freq: Every day | INTRAVENOUS | Status: DC

## 2023-11-01 MED ORDER — POLYETHYLENE GLYCOL 3350 17 G PO PACK: 17.0000 g | PACK | Freq: Every day | ORAL | Status: DC | PRN

## 2023-11-02 LAB — BLOOD CULTURE ID PANEL (REFLEXED) - BCID2

## 2023-11-02 LAB — GLUCOSE 6 PHOSPHATE DEHYDROGENASE
G6PDH: 9.8 U/g{Hb} (ref 4.8–15.7)
Hemoglobin: 7.1 g/dL — ABNORMAL LOW (ref 13.0–17.7)

## 2023-11-04 LAB — CULTURE, BLOOD (ROUTINE X 2)

## 2023-11-09 ENCOUNTER — Ambulatory Visit: Payer: Medicare Other | Admitting: Radiation Oncology

## 2023-12-01 NOTE — ED Notes (Signed)
100 Roc given

## 2023-12-01 NOTE — ED Notes (Signed)
 Report given to ICU

## 2023-12-01 NOTE — ED Notes (Signed)
 7.5, 23 @teeth , + color change

## 2023-12-01 NOTE — Consult Note (Signed)
 [facility] CONSULT NOTE    Date: 2023/11/24                  Patient Name:  Jerry Tran  MRN: [id number]  DOB: [birth year]  Age / Sex: 78 y.o., male         PCP: [facility]                 Service Requesting Consult: Critical care                 Reason for Consult: Acute kidney injury, severe hyperkalemia            History of Present Illness: Patient is a 78 y.o. male with a PMHx of osteoarthritis, chronic kidney disease stage IIIb, hyperlipidemia, GERD, hypertension, paroxysmal atrial fibrillation, prostate cancer s/p radiation therapy, history of right nephrostomy placement, PTSD, relapsed refractory mantle cell lymphoma with multiple chemotherapy regimens administered in the past, who was admitted to [facility] on 11/24/23 for evaluation of severe metabolic derangements, hypoglycemia, hypoxemia and acute respiratory failure.  Patient was intubated in the emergency department.  Found to be significantly hypotensive and started on multiple vasopressors.  He has severe metabolic derangements including hyponatremia with serum sodium of 125, hyperkalemia potassium of 5.6, acute metabolic acidosis with serum bicarbonate of 11 and acute kidney injury with a BUN of 91 and creatinine of 4.44.  Critical care contacted us  to evaluate him for renal placement therapy given severe metabolic derangements.  Left IJ temporary dialysis catheter has been placed.   Medications: Outpatient medications: Medications Prior to Admission  Medication Sig Dispense Refill Last Dose/Taking   acyclovir  (ZOVIRAX ) 400 MG tablet TAKE 1 TABLET(400 MG) BY MOUTH DAILY 30 tablet 3 10/31/2023   allopurinol  (ZYLOPRIM ) 100 MG tablet Take 1 tablet (100 mg total) by mouth daily. 30 tablet 1 10/31/2023   aluminum-magnesium  hydroxide-simethicone  (MAALOX) 200-200-20 MG/5ML SUSP Take 10 mLs by mouth every 6 (six) hours as needed.   Taking As Needed   ammonium lactate (LAC-HYDRIN) 12 % lotion Apply 1  application topically 2 (two) times daily as needed for dry skin.   Taking As Needed   atorvastatin  (LIPITOR) 10 MG tablet Take 10 mg by mouth at bedtime.   10/30/2023   Baclofen 5 MG TABS Take 5 mg by mouth daily.   10/31/2023   cyclobenzaprine  (FLEXERIL ) 5 MG tablet Take 1 tablet (5 mg total) by mouth 3 (three) times daily as needed for muscle spasms. (Patient taking differently: Take 5 mg by mouth every 8 (eight) hours as needed for muscle spasms.)   10/26/2023   diclofenac Sodium (VOLTAREN) 1 % GEL Apply 2 g topically 4 (four) times daily as needed (pain).   Taking As Needed   docusate sodium  (COLACE) 100 MG capsule Take 1 capsule (100 mg total) by mouth 2 (two) times daily.   10/31/2023   fluticasone (FLONASE) 50 MCG/ACT nasal spray Place 1 spray into both nostrils daily as needed for allergies or rhinitis.   Taking As Needed   HYDROcodone -acetaminophen  (NORCO/VICODIN) 5-325 MG tablet Take 2 tablets by mouth every 6 (six) hours as needed for moderate pain (pain score 4-6) or severe pain (pain score 7-10).   November 24, 2023 at  5:28 AM   hydroxypropyl methylcellulose / hypromellose (ISOPTO TEARS / GONIOVISC) 2.5 % ophthalmic solution Place 2 drops into both eyes 2 (two) times daily as needed for dry eyes.   Taking As Needed   lidocaine -prilocaine  (EMLA ) cream Apply to affected area  once 30 g 3 Taking   magic mouthwash (multi-ingredient) oral suspension Take 5 mLs by mouth 4 times daily as needed for mouth and throat pain. 400 mL 1 Taking   melatonin 3 MG TABS tablet Take 3 mg by mouth at bedtime.   10/30/2023   midodrine  (PROAMATINE ) 10 MG tablet Take 1 tablet (10 mg total) by mouth 3 (three) times daily with meals.   10/31/2023   nystatin  (MYCOSTATIN ) 100000 UNIT/ML suspension Take 5 mLs by mouth 4 (four) times daily as needed.   Taking As Needed   omeprazole (PRILOSEC) 20 MG capsule Take 20 mg by mouth daily before breakfast.   11-22-2023   ondansetron  (ZOFRAN ) 8 MG tablet Take 1 tablet (8 mg total) by mouth  every 8 (eight) hours as needed for nausea or vomiting. Start on the third day after chemotherapy. 30 tablet 1 Taking As Needed   polyethylene glycol (MIRALAX  / GLYCOLAX ) 17 g packet Take 17 g by mouth daily as needed for mild constipation.   Taking As Needed   prazosin  (MINIPRESS ) 2 MG capsule Take 6 mg by mouth at bedtime.   10/30/2023   predniSONE  (DELTASONE ) 20 MG tablet Take 2 tablets (40 mg total) by mouth daily with breakfast.   10/31/2023   prochlorperazine  (COMPAZINE ) 10 MG tablet Take 1 tablet (10 mg total) by mouth every 6 (six) hours as needed for nausea or vomiting. 30 tablet 1 Taking As Needed   senna (SENOKOT) 8.6 MG TABS tablet Take 1 tablet (8.6 mg total) by mouth 2 (two) times daily.   10/31/2023   sodium bicarbonate  650 MG tablet Take 1 tablet (650 mg total) by mouth 2 (two) times daily.   10/31/2023   terbinafine (LAMISIL) 1 % cream Apply 1 application topically 2 (two) times daily as needed (athlete's foot).   Taking As Needed   triamcinolone cream (KENALOG) 0.1 % Apply 1 application topically 2 (two) times daily as needed (rash).   Taking As Needed   Vitamin D, Cholecalciferol, 25 MCG (1000 UT) TABS Take 1 tablet by mouth daily.   10/31/2023   feeding supplement (ENSURE ENLIVE / ENSURE PLUS) LIQD Take 237 mLs by mouth 2 (two) times daily between meals. (Patient not taking: Reported on 09/28/2023) 237 mL 12    oxybutynin  (DITROPAN -XL) 10 MG 24 hr tablet Take 1 tablet (10 mg total) by mouth at bedtime.   10/30/2023    Current medications: Current Facility-Administered Medications  Medication Dose Route Frequency Provider Last Rate Last Admin   ceFEPIme  (MAXIPIME ) 2 g in sodium chloride  0.9 % 100 mL IVPB  2 g Intravenous Q24H Perley Lum DEL, [place]   Stopped at November 22, 2023 1337   Chlorhexidine  Gluconate Cloth 2 % PADS 6 each  6 each Topical Daily Parris Manna, MD   6 each at 2023/11/22 1337   dextrose  10 % infusion   Intravenous Continuous Keene, Jeremiah D, NP 50 mL/hr at 11-22-2023 1402  Rate Change at 11/22/23 1402   dextrose  50 % solution            docusate (COLACE) 50 MG/5ML liquid 100 mg  100 mg Per Tube BID Keene, Jeremiah D, NP       docusate sodium  (COLACE) capsule 100 mg  100 mg Oral BID PRN Keene, Jeremiah D, NP       fentaNYL  (SUBLIMAZE ) injection 25 mcg  25 mcg Intravenous Q15 min PRN Keene, Jeremiah D, NP       fentaNYL  (SUBLIMAZE ) injection 25-100 mcg  25-100 mcg  Intravenous Q30 min PRN Keene, Jeremiah D, NP   100 mcg at 11/23/2023 1252   heparin  injection 1,000-6,000 Units  1,000-6,000 Units CRRT PRN Druscilla Bald, NP       hydrocortisone  sodium succinate  (SOLU-CORTEF ) 100 MG injection 50 mg  50 mg Intravenous Q6H Keene, Jeremiah D, NP   50 mg at 11/23/23 1129   lactated ringers  bolus 500 mL  500 mL Intravenous Once Bradler, Evan K, MD       micafungin  (MYCAMINE ) 100 mg in sodium chloride  0.9 % 100 mL IVPB  100 mg Intravenous Q24H Aleskerov, Fuad, MD       midazolam  (VERSED ) injection 1-2 mg  1-2 mg Intravenous Q1H PRN Keene, Jeremiah D, NP   2 mg at 2023-11-23 1337   norepinephrine  (LEVOPHED ) 16 mg in 250mL (0.064 mg/mL) premix infusion  0-40 mcg/min Intravenous Continuous Shellia Inge BIRCH, NP 56.3 mL/hr at 2023/11/23 1324 60 mcg/min at 2023-11-23 1324   pantoprazole  (PROTONIX ) injection 40 mg  40 mg Intravenous QHS Keene, Jeremiah D, NP       polyethylene glycol (MIRALAX  / GLYCOLAX ) packet 17 g  17 g Oral Daily PRN Keene, Jeremiah D, NP       polyethylene glycol (MIRALAX  / GLYCOLAX ) packet 17 g  17 g Per Tube Daily Shellia Inge BIRCH, NP       PrismaSol  BGK 2/3.5 infusion   CRRT Continuous Druscilla Bald, NP       PrismaSol  BGK 2/3.5 infusion   CRRT Continuous Druscilla Bald, NP       PrismaSol  BGK 2/3.5 infusion   CRRT Continuous Breeze, Bald, NP       propofol  (DIPRIVAN ) 1000 MG/100ML infusion            rasburicase  (ELITEK ) 6 mg in sodium chloride  0.9 % 46 mL IVPB  6 mg Intravenous Once Finnegan, Timothy J, MD       rocuronium  (ZEMURON ) 100 MG/10ML  injection            sodium bicarbonate  150 mEq in dextrose  5 % 1,150 mL infusion   Intravenous Continuous Shellia Inge BIRCH, NP 125 mL/hr at 23-Nov-2023 1107 New Bag at 2023-11-23 1107   sodium zirconium cyclosilicate  (LOKELMA ) packet 10 g  10 g Per Tube Once Bradler, Evan K, MD       vancomycin  (VANCOREADY) IVPB 1500 mg/300 mL  1,500 mg Intravenous Once Perley Lum DEL, RPH 150 mL/hr at 11/23/23 1357 1,500 mg at 11/23/2023 1357   And   vancomycin  (VANCOCIN ) IVPB 1000 mg/200 mL premix  1,000 mg Intravenous Once Hunt, Madison H, RPH       vasopressin  (PITRESSIN) 20 Units in 100 mL (0.2 unit/mL) infusion-*FOR SHOCK*  0-0.04 Units/min Intravenous Continuous Bradler, Evan K, MD 12 mL/hr at 11/23/2023 1406 0.04 Units/min at 2023-11-23 1406      Allergies: Allergies  Allergen Reactions   Sildenafil Other (See Comments)    Unknown reaction - reported by [facility]      Past Medical History: Past Medical History:  Diagnosis Date   Arthritis    Chronic kidney disease, stage 3b (HCC) 12/09/2021   Dyslipidemia    GERD (gastroesophageal reflux disease)    Hypertension    PAF (paroxysmal atrial fibrillation) (HCC)    Prostate cancer (HCC) 03/2021   s/p radiation therapy   PTSD (post-traumatic stress disorder)      Past Surgical History: Past Surgical History:  Procedure Laterality Date   CYSTOSCOPY W/ RETROGRADES Bilateral 10/05/2023   Procedure: CYSTOSCOPY WITH BILATERAL RETROGRADE PYELOGRAM  and BILATERAL STENT PLACEMENT;  Surgeon: Carolee Sherwood JONETTA DOUGLAS, MD;  Location: [facility];  Service: Urology;  Laterality: Bilateral;   IR IMAGING GUIDED PORT INSERTION  11/06/2022   IR NEPHROSTOMY PLACEMENT LEFT  10/20/2023   IR NEPHROSTOMY PLACEMENT RIGHT  10/20/2023   LIPOMA EXCISION Right 12/09/2021   Procedure: RIGHT SUPERCLAVICULAR LYMPH NODE EXCISION;  Surgeon: Teresa Lonni HERO, MD;  Location: [facility] OR;  Service: General;  Laterality: Right;     Family History: Family History  Problem Relation Age of Onset    Cancer Neg Hx      Social History: Social History   Socioeconomic History   Marital status: Married    Spouse name: Not on file   Number of children: Not on file   Years of education: Not on file   Highest education level: Not on file  Occupational History   Occupation: retired  Tobacco Use   Smoking status: Former    Current packs/day: 1.00    Average packs/day: 1 pack/day for 15.0 years (15.0 ttl pk-yrs)    Types: Cigarettes   Smokeless tobacco: Never  Substance and Sexual Activity   Alcohol  use: Not Currently    Comment: h/o heavy use   Drug use: Not Currently    Types: Marijuana, Cocaine    Comment: remote   Sexual activity: Not on file  Other Topics Concern   Not on file  Social History Narrative   Not on file   Social Drivers of Health   Financial Resource Strain: Not on file  Food Insecurity: No Food Insecurity (Nov 29, 2023)   Hunger Vital Sign    Worried About Running Out of Food in the Last Year: Never true    Ran Out of Food in the Last Year: Never true  Transportation Needs: No Transportation Needs (November 29, 2023)   PRAPARE - Administrator, Civil Service (Medical): No    Lack of Transportation (Non-Medical): No  Physical Activity: Not on file  Stress: Not on file  Social Connections: Not on file  Intimate Partner Violence: Not At Risk (Nov 29, 2023)   Humiliation, Afraid, Rape, and Kick questionnaire    Fear of Current or Ex-Partner: No    Emotionally Abused: No    Physically Abused: No    Sexually Abused: No     Review of Systems: Patient unable to provide as he is currently intubated and sedated  Vital Signs: Blood pressure 105/82, pulse (!) 150, temperature 97.8 F (36.6 C), temperature source Axillary, resp. rate (!) 26, SpO2 100%.  Weight trends: There were no vitals filed for this visit.   Physical Exam: General: Critically ill-appearing  Head: Normocephalic, atraumatic.  Endotracheal tube in place  Eyes: Anicteric  Neck: Supple   Lungs:  Scattered rales and rhonchi, vent assisted  Heart: S1S2 no rubs  Abdomen:  Soft, nontender, bowel sounds present  Extremities: 3+ peripheral edema extending up to the abdomen  Neurologic: Intubated, sedated  Skin: No acute rash  Access: Left IJ temporary dialysis catheter in place    Lab results: Basic Metabolic Panel: Recent Labs  Lab November 29, 2023 0806 2023-11-29 1158 2023-11-29 1404  NA 128*  --  125*  K 7.0*  --  5.6*  CL 96*  --  95*  CO2 10*  --  11*  GLUCOSE 167*  --  175*  BUN 92*  --  91*  CREATININE 4.66*  --  4.44*  CALCIUM  8.1*  --  7.5*  PHOS  --  9.5*  --  Liver Function Tests: Recent Labs  Lab 11-04-23 0806  AST 85*  ALT 18  ALKPHOS 50  BILITOT 1.0  PROT 4.7*  ALBUMIN  2.4*   No results for input(s): LIPASE, AMYLASE in the last 168 hours. No results for input(s): AMMONIA in the last 168 hours.  CBC: Recent Labs  Lab 2023/11/04 0806  WBC 0.8*  NEUTROABS 0.5*  HGB 7.5*  HCT 25.0*  MCV 96.5  PLT 55*    Cardiac Enzymes: No results for input(s): CKTOTAL, CKMB, CKMBINDEX, TROPONINI in the last 168 hours.  BNP: Invalid input(s): POCBNP  CBG: Recent Labs  Lab 04-Nov-2023 0745 11-04-23 0855 11-04-2023 1023 November 04, 2023 1151 04-Nov-2023 1329  GLUCAP 157* 89 122* 284* 196*    Microbiology: Results for orders placed or performed during the hospital encounter of 11/04/2023  Resp panel by RT-PCR (RSV, Flu A&B, Covid) Anterior Nasal Swab     Status: None   Collection Time: 11-04-23 10:52 AM   Specimen: Anterior Nasal Swab  Result Value Ref Range Status   SARS Coronavirus 2 by RT PCR NEGATIVE NEGATIVE Final    Comment: (NOTE) SARS-CoV-2 target nucleic acids are NOT DETECTED.  The SARS-CoV-2 RNA is generally detectable in upper respiratory specimens during the acute phase of infection. The lowest concentration of SARS-CoV-2 viral copies this assay can detect is 138 copies/mL. A negative result does not preclude SARS-Cov-2 infection  and should not be used as the sole basis for treatment or other patient management decisions. A negative result may occur with  improper specimen collection/handling, submission of specimen other than nasopharyngeal swab, presence of viral mutation(s) within the areas targeted by this assay, and inadequate number of viral copies(<138 copies/mL). A negative result must be combined with clinical observations, patient history, and epidemiological information. The expected result is Negative.  Fact Sheet for Patients:  bloggercourse.com  Fact Sheet for Healthcare Providers:  seriousbroker.it  This test is no t yet approved or cleared by the [place]  FDA and  has been authorized for detection and/or diagnosis of SARS-CoV-2 by FDA under an Emergency Use Authorization (EUA). This EUA will remain  in effect (meaning this test can be used) for the duration of the COVID-19 declaration under Section 564(b)(1) of the Act, 21 U.S.C.section 360bbb-3(b)(1), unless the authorization is terminated  or revoked sooner.       Influenza A by PCR NEGATIVE NEGATIVE Final   Influenza B by PCR NEGATIVE NEGATIVE Final    Comment: (NOTE) The Xpert Xpress SARS-CoV-2/FLU/RSV plus assay is intended as an aid in the diagnosis of influenza from Nasopharyngeal swab specimens and should not be used as a sole basis for treatment. Nasal washings and aspirates are unacceptable for Xpert Xpress SARS-CoV-2/FLU/RSV testing.  Fact Sheet for Patients: bloggercourse.com  Fact Sheet for Healthcare Providers: seriousbroker.it  This test is not yet approved or cleared by the [place]  FDA and has been authorized for detection and/or diagnosis of SARS-CoV-2 by FDA under an Emergency Use Authorization (EUA). This EUA will remain in effect (meaning this test can be used) for the duration of the COVID-19 declaration  under Section 564(b)(1) of the Act, 21 U.S.C. section 360bbb-3(b)(1), unless the authorization is terminated or revoked.     Resp Syncytial Virus by PCR NEGATIVE NEGATIVE Final    Comment: (NOTE) Fact Sheet for Patients: bloggercourse.com  Fact Sheet for Healthcare Providers: seriousbroker.it  This test is not yet approved or cleared by the [place]  FDA and has been authorized for detection and/or diagnosis of SARS-CoV-2 by  FDA under an Emergency Use Authorization (EUA). This EUA will remain in effect (meaning this test can be used) for the duration of the COVID-19 declaration under Section 564(b)(1) of the Act, 21 U.S.C. section 360bbb-3(b)(1), unless the authorization is terminated or revoked.  Performed at [facility], [address] [place], [place] [address]     Coagulation Studies: Recent Labs    2023/11/07 1052  LABPROT 20.4*  INR 1.7*    Urinalysis: No results for input(s): COLORURINE, LABSPEC, PHURINE, GLUCOSEU, HGBUR, BILIRUBINUR, KETONESUR, PROTEINUR, UROBILINOGEN, NITRITE, LEUKOCYTESUR in the last 72 hours.  Invalid input(s): APPERANCEUR    Imaging: DG Chest Port 1 View Result Date: 11/07/23 CLINICAL DATA:  Line placement EXAM: PORTABLE CHEST 1 VIEW, abdominal x-ray limited for tube placement COMPARISON:  X-ray 2023/11/07 FINDINGS: Stable ET tube. Stable right IJ port with tip overlying the right atrium. Enteric tube in place with the tip extending beneath the diaphragm. However tip overlies the upper stomach and side hole above the GE junction. This could be advanced further into the stomach. New double-lumen left IJ catheter with the tip along the upper SVC. Underinflation with basilar atelectasis. No pneumothorax, effusion or edema. Stable cardiopericardial silhouette. Overlapping cardiac leads and defibrillator pads. Several dilated loops of bowel with air in the upper  abdomen. Presumed percutaneous nephrostomy tube on the right. Bilateral ureteral stents suggested. IMPRESSION: Interval placement of enteric tube with side hole above the GE junction. This could be advanced further into the stomach. Persistent distended, dilated loops of bowel throughout the visualized portions of the upper abdomen. New left IJ double-lumen catheter.  No pneumothorax. Basilar atelectasis. Stable ET tube and right-sided chest port. Percutaneous nephrostomy and ureteral stent Electronically Signed   By: Ranell Bring M.D.   On: 11/07/2023 12:04   DG Abd 1 View Result Date: November 07, 2023 CLINICAL DATA:  Line placement EXAM: PORTABLE CHEST 1 VIEW, abdominal x-ray limited for tube placement COMPARISON:  X-ray 07-Nov-2023 FINDINGS: Stable ET tube. Stable right IJ port with tip overlying the right atrium. Enteric tube in place with the tip extending beneath the diaphragm. However tip overlies the upper stomach and side hole above the GE junction. This could be advanced further into the stomach. New double-lumen left IJ catheter with the tip along the upper SVC. Underinflation with basilar atelectasis. No pneumothorax, effusion or edema. Stable cardiopericardial silhouette. Overlapping cardiac leads and defibrillator pads. Several dilated loops of bowel with air in the upper abdomen. Presumed percutaneous nephrostomy tube on the right. Bilateral ureteral stents suggested. IMPRESSION: Interval placement of enteric tube with side hole above the GE junction. This could be advanced further into the stomach. Persistent distended, dilated loops of bowel throughout the visualized portions of the upper abdomen. New left IJ double-lumen catheter.  No pneumothorax. Basilar atelectasis. Stable ET tube and right-sided chest port. Percutaneous nephrostomy and ureteral stent Electronically Signed   By: Ranell Bring M.D.   On: 11-07-2023 12:04   DG Chest Portable 1 View Result Date: 2023/11/07 CLINICAL DATA:  Status post  intubation. EXAM: PORTABLE CHEST 1 VIEW COMPARISON:  10/06/2023 FINDINGS: Low lung volume. Mild central pulmonary vascular congestion, which is likely accentuated by low lung volume. No frank pulmonary edema. There are nonspecific opacities at the lung bases, which may represent fibrosis versus atelectasis. Bilateral lungs are otherwise clear. No acute consolidation or lung collapse. Bilateral costophrenic angles are clear. Normal cardio-mediastinal silhouette. No acute osseous abnormalities. The soft tissues are within normal limits. Interval placement of endotracheal tube with its  tip approximately 3.9 cm above the carina. Redemonstration of right-sided CT Port-A-Cath with its tip overlying the cavoatrial junction region. IMPRESSION: 1. Interval placement of endotracheal tube with its tip proximally 3.9 cm above the carina. 2. Mild central pulmonary vascular congestion. No frank pulmonary edema. No acute pulmonary abnormality. Electronically Signed   By: Ree Molt M.D.   On: 11-14-2023 08:37     Assessment & Plan: Pt is a 78 y.o. male with a PMHx of osteoarthritis, chronic kidney disease stage IIIb, hyperlipidemia, GERD, hypertension, paroxysmal atrial fibrillation, prostate cancer s/p radiation therapy, history of right nephrostomy placement, PTSD, relapsed refractory mantle cell lymphoma with multiple chemotherapy regimens administered in the past, who was admitted to [facility] on 11/14/2023 for evaluation of severe metabolic derangements, hypoglycemia, hypoxemia and acute respiratory failure.  1.  Acute kidney injury/chronic kidney disease stage IIIb baseline EGFR in the 30s/hyperkalemia.  Patient with multiple severe metabolic derangements along with total body volume overload.  Patient is critically ill at the moment.  We will proceed with CRRT.  Appreciate critical care placing left IJ temporary dialysis catheter.  We will plan to start the patient on a 2K bath and also plan to keep her volume removal  goal of net even for now given the fact that he is on multiple pressors.  Reevaluate to increase UF rate tomorrow.  2.  Acute respiratory failure.  Patient maintained on the ventilator at the moment.  Recommend goals of care discussion given refractory and recurrent mantle cell lymphoma.  3.  Acute metabolic acidosis.  Serum bicarbonate low at 11.  CRRT should help to correct this.  4.  Patient with guarded prognosis.

## 2023-12-01 NOTE — ED Notes (Signed)
 Placed on zoll @this  time

## 2023-12-01 NOTE — Death Summary Note (Signed)
 DEATH SUMMARY   Patient Details  Name: Jerry Tran MRN: [id number] DOB: July 24, 1946  Admission/Discharge Information   Admit Date:  11/17/23  Date of Death:  11/17/2023  Time of Death:  18:35  Length of Stay: 0  Referring Physician: [facility]   Reason(s) for Hospitalization  Multifactorial Shock Elevated Troponin Chronic HFpEF Presumed Pulmonary Embolism Bilateral Lower Extremity DVT's Acute Hypoxic Respiratory Failure Severe Sepsis of unknown etiology, suspect urinary source Acute Kidney Injury Anion Gap Metabolic Acidosis Lactic Acidosis Hyperkalemia Pancytopenia Mantle Cell Lymphoma Questionable Tumor Lysis Syndrome Acute Metabolic Encephalopathy   Diagnoses  Preliminary cause of death:  Multifactorial Shock (Septic +/- Obstructive +/- Cardiogenic) Secondary Diagnoses (including complications and co-morbidities):  Principal Problem:   Shock (HCC) Elevated Troponin Chronic HFpEF Presumed Pulmonary Embolism Bilateral Lower Extremity DVT's Acute Hypoxic Respiratory Failure Severe Sepsis of unknown etiology, suspect urinary source Acute Kidney Injury Anion Gap Metabolic Acidosis Lactic Acidosis Hyperkalemia Pancytopenia Mantle Cell Lymphoma Questionable Tumor Lysis Syndrome Acute Metabolic Encephalopathy   Brief Hospital Course (including significant findings, care, treatment, and services provided and events leading to death)  Jerry Tran is a 78 year old male with a past medical history significant for mantle cell lymphoma with metastasis, CKD, HFpEF who presented from his skilled nursing facility after being found unresponsive with hypoglycemia (CBGs in the 30s), and hypoxic with O2 saturation in the 80s.  Patient is currently unresponsive on the vent and no family currently available, therefore history of obtained from chart review.   Of note he was recently admitted at [facility] from 09/28/2023 through 10/22/2023 for bilateral  hydroureteronephrosis secondary to compressive effect from mantle cell lymphoma requiring ureteral stent placement on 12/6.  On 12/7 he was found to have moderate sized subsegmental perfusion defects on VQ scan compatible with pulmonary embolism, along with bilateral LE DVT's. He was started on heparin  drip however developed acute blood loss anemia from hematuria and possible intramuscular hemorrhage in the psoas muscles,  with hemoglobin dropping to 4.1 requiring multiple blood transfusions.  Eventually was discharged to skilled nursing facility with plans to follow-up with urology, oncology, and palliative care.  Patient and family declined anticoagulation for treatment of PE given his acute blood loss anemia.  As per oncology he was not a candidate for any further treatments, with chemotherapy to be stopped with recommendation for hospice and palliative care.   ED Course: Initial Vital Signs: Pulse 120, respiratory rate 30, blood pressure 136/121 which dropped to 86/54, SpO2 100% Significant Labs: Sodium 128, potassium 7.0, bicarb 10, glucose 167, BUN 92, creatinine 4.66, calcium  8.1, anion gap 22, WBC 0.8, hemoglobin 7.5, platelets 55, D-dimer 17, INR 1.7, high-sensitivity troponin 711, lactic acid 8.4, procalcitonin greater than 150 ABG: pH less than 6.95/pCO2 49/pO2 401/bicarb 10.1 COVID-19 PCR negative Urinalysis and urine drug screen is pending Imaging Chest X-ray>>IMPRESSION: 1. Interval placement of endotracheal tube with its tip proximally 3.9 cm above the carina. 2. Mild central pulmonary vascular congestion. No frank pulmonary edema. No acute pulmonary abnormality. Medications Administered: 3.5 L of IV fluid boluses, Levophed  and vasopressin  drips initiated, IV azithromycin  and ceftriaxone    Given his work of breathing and GCS of 8, ED provider elected to intubate for airway protection.  PCCM is asked to admit for further workup and treatment.   Please see significant hospital events  section below for full detailed hospital course.  Significant Hospital Events: Including procedures, antibiotic start and stop dates in addition to other pertinent events   1/2: Presented to ED unresponsive,  hypoglycemic, profound shock.  Intubated by ED provider.  PCCM asked to admit.  Critically ill,multiorgan failure.  Temporary HD catheter placed for anticipation for CRRT.  Nephrology consulted, plans to start CRRT.  Cardiology consulted for elevated troponin, Vascular Surgery consulted for presumed PE and DVT's.  Oncology and Palliative Care consulted, high risk for death. 1/2: Later in the evening, goals of care discussion with family. They wish to transition to COMFORT MEASURES.  Pt expired shortly after transition to COMFORT MEASURES.    Pertinent Labs and Studies  Significant Diagnostic Studies US  Venous Img Lower Bilateral (DVT) Result Date: 11/16/2023 CLINICAL DATA:  Bilateral lower extremity edema. History of lymphoma. EXAM: BILATERAL LOWER EXTREMITY VENOUS DOPPLER ULTRASOUND TECHNIQUE: Gray-scale sonography with graded compression, as well as color Doppler and duplex ultrasound were performed to evaluate the lower extremity deep venous systems from the level of the common femoral vein and including the common femoral, femoral, profunda femoral, popliteal and calf veins including the posterior tibial, peroneal and gastrocnemius veins when visible. The superficial great saphenous vein was also interrogated. Spectral Doppler was utilized to evaluate flow at rest and with distal augmentation maneuvers in the common femoral, femoral and popliteal veins. COMPARISON:  CT of the abdomen and pelvis on 10/18/2023 FINDINGS: RIGHT LOWER EXTREMITY Common Femoral Vein: Occlusive thrombus present. Saphenofemoral Junction: No evidence of thrombus. Normal compressibility and flow on color Doppler imaging. Profunda Femoral Vein: The profunda femoral vein was not able to be visualized adequately. Femoral Vein:  Occlusive thrombus present. Popliteal Vein: Occlusive thrombus present. Calf Veins: Posterior tibial vein thrombus visualized. The peroneal vein was not visualized. Superficial Great Saphenous Vein: No evidence of thrombus. Normal compressibility. Venous Reflux:  None. Other Findings: No evidence of superficial thrombophlebitis or abnormal fluid collection. Enlarged left inguinal lymph node measures up to 1.6 cm in short axis. LEFT LOWER EXTREMITY Common Femoral Vein: Occlusive thrombus present. Saphenofemoral Junction: Thrombus present at the saphenofemoral junction. Profunda Femoral Vein: Thrombus present in the profunda femoral vein. Femoral Vein: Occlusive thrombus present. Popliteal Vein: Occlusive thrombus present. Calf Veins: Thrombus visualized in posterior tibial and peroneal veins. Superficial Great Saphenous Vein: No evidence of thrombus. Normal compressibility. Venous Reflux:  None. Other Findings: No evidence of superficial thrombophlebitis or abnormal fluid collection. Enlarged right inguinal lymph node measures up to 4.5 cm in short axis. IMPRESSION: 1. Extensive bilateral lower extremity deep venous thrombosis. See above. 2. Enlarged bilateral inguinal lymph nodes. Enlarged lymph nodes were seen by recent CT and the patient has a known history of lymphoma with extensive lymphadenopathy. Electronically Signed   By: Marcey Moan M.D.   On: 2023-11-16 16:25   DG Abd 1 View Result Date: 16-Nov-2023 CLINICAL DATA:  OG tube placement EXAM: ABDOMEN - 1 VIEW limited for tube placement COMPARISON:  November 16, 2023 earlier FINDINGS: Limited view of the upper abdomen has enteric tube in place. Tip at the GE junction and side hole above in the mediastinum. Recommend this be advanced further into the stomach. Ureteral stents are seen. Right-sided presumed percutaneous nephrostomy. Overlapping cardiac leads and defibrillator pads. Multiple dilated air-filled loops of bowel throughout the upper abdomen. IMPRESSION:  Limited x-ray for tube placement has a enteric tube with the tip at the GE junction. Recommend this be advanced several cm further into the stomach. Electronically Signed   By: Ranell Bring M.D.   On: 16-Nov-2023 15:29   DG Chest Port 1 View Result Date: 2023-11-16 CLINICAL DATA:  Line placement EXAM: PORTABLE CHEST 1 VIEW, abdominal x-ray limited  for tube placement COMPARISON:  X-ray 2023-11-14 FINDINGS: Stable ET tube. Stable right IJ port with tip overlying the right atrium. Enteric tube in place with the tip extending beneath the diaphragm. However tip overlies the upper stomach and side hole above the GE junction. This could be advanced further into the stomach. New double-lumen left IJ catheter with the tip along the upper SVC. Underinflation with basilar atelectasis. No pneumothorax, effusion or edema. Stable cardiopericardial silhouette. Overlapping cardiac leads and defibrillator pads. Several dilated loops of bowel with air in the upper abdomen. Presumed percutaneous nephrostomy tube on the right. Bilateral ureteral stents suggested. IMPRESSION: Interval placement of enteric tube with side hole above the GE junction. This could be advanced further into the stomach. Persistent distended, dilated loops of bowel throughout the visualized portions of the upper abdomen. New left IJ double-lumen catheter.  No pneumothorax. Basilar atelectasis. Stable ET tube and right-sided chest port. Percutaneous nephrostomy and ureteral stent Electronically Signed   By: Ranell Bring M.D.   On: 11/14/2023 12:04   DG Abd 1 View Result Date: Nov 14, 2023 CLINICAL DATA:  Line placement EXAM: PORTABLE CHEST 1 VIEW, abdominal x-ray limited for tube placement COMPARISON:  X-ray 11-14-23 FINDINGS: Stable ET tube. Stable right IJ port with tip overlying the right atrium. Enteric tube in place with the tip extending beneath the diaphragm. However tip overlies the upper stomach and side hole above the GE junction. This could be  advanced further into the stomach. New double-lumen left IJ catheter with the tip along the upper SVC. Underinflation with basilar atelectasis. No pneumothorax, effusion or edema. Stable cardiopericardial silhouette. Overlapping cardiac leads and defibrillator pads. Several dilated loops of bowel with air in the upper abdomen. Presumed percutaneous nephrostomy tube on the right. Bilateral ureteral stents suggested. IMPRESSION: Interval placement of enteric tube with side hole above the GE junction. This could be advanced further into the stomach. Persistent distended, dilated loops of bowel throughout the visualized portions of the upper abdomen. New left IJ double-lumen catheter.  No pneumothorax. Basilar atelectasis. Stable ET tube and right-sided chest port. Percutaneous nephrostomy and ureteral stent Electronically Signed   By: Ranell Bring M.D.   On: Nov 14, 2023 12:04   DG Chest Portable 1 View Result Date: 11-14-23 CLINICAL DATA:  Status post intubation. EXAM: PORTABLE CHEST 1 VIEW COMPARISON:  10/06/2023 FINDINGS: Low lung volume. Mild central pulmonary vascular congestion, which is likely accentuated by low lung volume. No frank pulmonary edema. There are nonspecific opacities at the lung bases, which may represent fibrosis versus atelectasis. Bilateral lungs are otherwise clear. No acute consolidation or lung collapse. Bilateral costophrenic angles are clear. Normal cardio-mediastinal silhouette. No acute osseous abnormalities. The soft tissues are within normal limits. Interval placement of endotracheal tube with its tip approximately 3.9 cm above the carina. Redemonstration of right-sided CT Port-A-Cath with its tip overlying the cavoatrial junction region. IMPRESSION: 1. Interval placement of endotracheal tube with its tip proximally 3.9 cm above the carina. 2. Mild central pulmonary vascular congestion. No frank pulmonary edema. No acute pulmonary abnormality. Electronically Signed   By: Ree Molt M.D.   On: 11/14/2023 08:37   IR NEPHROSTOMY PLACEMENT LEFT Result Date: 10/25/2023 INDICATION: 78 year old gentleman with history of bilateral ureteral obstruction due to retroperitoneal lymphadenopathy from lymphoma has worsening renal failure despite presence of bilateral ureteral stents. IR consulted for nephrostomy drain placement. EXAM: 1. Successful placement of right percutaneous nephrostomy drain 2. Unsuccessful placement of left pregnancy nephrostomy drain COMPARISON:  CT abdomen pelvis 10/18/2023 MEDICATIONS:  Rocephin  1 gm IV; The antibiotic was administered in an appropriate time frame prior to skin puncture. ANESTHESIA/SEDATION: Fentanyl  100 mcg IV; Versed  2 mg IV Moderate Sedation Time:  35 The patient was continuously monitored during the procedure by the interventional radiology nurse under my direct supervision. CONTRAST:  10 mL of Omnipaque  300-administered into the collecting system(s) FLUOROSCOPY TIME:  Radiation Exposure Index (as provided by the fluoroscopic device): 29 mGy Kerma COMPLICATIONS: None immediate. PROCEDURE: Informed written consent was obtained from the patient after a thorough discussion of the procedural risks, benefits and alternatives. All questions were addressed. Maximal Sterile Barrier Technique was utilized including caps, mask, sterile gowns, sterile gloves, sterile drape, hand hygiene and skin antiseptic. A timeout was performed prior to the initiation of the procedure. Patient position prone on the procedure table. Bilateral flank skin prepped and draped in usual fashion. Sonographic evaluation of the right kidney demonstrated mild hydronephrosis. Following local administration, an upper pole calyx was accessed with a 21 gauge needle utilizing continuous ultrasound guidance. 21 gauge needle exchanged for transitional dilator set over 0.018 inch guidewire. Contrast administered through the transitional dilator confirmed appropriate access of the renal  collecting system. The transitional dilator was exchanged for 10 French multipurpose pigtail drain over 0.035 inch guidewire. 5 mL of purulent urine was aspirated and sent for Gram stain and culture. The drain was secured to skin with suture and connected to bag. Sonographic evaluation of the left kidney demonstrated minimal hydronephrosis. Following local administration, multiple attempts were made to access mid and lower pole calices with a 21 gauge needle utilizing continuous ultrasound guidance, however none were successful. Given only minimal hydronephrosis of the left kidney, the procedure was terminated. IMPRESSION: 1. Successful insertion of 10 French right nephrostomy drain. 2. Unsuccessful attempt at insertion of left nephrostomy drain due to minimal hydronephrosis. If the patient's renal function does not significantly improve, repeat ultrasound should be performed to evaluate the degree of right hydronephrosis. Electronically Signed   By: Aliene Lloyd M.D.   On: 10/25/2023 13:31   IR NEPHROSTOMY PLACEMENT RIGHT Result Date: 10/20/2023 INDICATION: 78 year old gentleman with history of bilateral ureteral obstruction due to retroperitoneal lymphadenopathy from lymphoma has worsening renal failure despite presence of bilateral ureteral stents. IR consulted for nephrostomy drain placement. EXAM: 1. Successful placement of right percutaneous nephrostomy drain 2. Unsuccessful placement of left pregnancy nephrostomy drain COMPARISON:  CT abdomen pelvis 10/18/2023 MEDICATIONS: Rocephin  1 gm IV; The antibiotic was administered in an appropriate time frame prior to skin puncture. ANESTHESIA/SEDATION: Fentanyl  100 mcg IV; Versed  2 mg IV Moderate Sedation Time:  35 The patient was continuously monitored during the procedure by the interventional radiology nurse under my direct supervision. CONTRAST:  10 mL of Omnipaque  300-administered into the collecting system(s) FLUOROSCOPY TIME:  Radiation Exposure Index (as  provided by the fluoroscopic device): 29 mGy Kerma COMPLICATIONS: None immediate. PROCEDURE: Informed written consent was obtained from the patient after a thorough discussion of the procedural risks, benefits and alternatives. All questions were addressed. Maximal Sterile Barrier Technique was utilized including caps, mask, sterile gowns, sterile gloves, sterile drape, hand hygiene and skin antiseptic. A timeout was performed prior to the initiation of the procedure. Patient position prone on the procedure table. Bilateral flank skin prepped and draped in usual fashion. Sonographic evaluation of the right kidney demonstrated mild hydronephrosis. Following local administration, an upper pole calyx was accessed with a 21 gauge needle utilizing continuous ultrasound guidance. 21 gauge needle exchanged for transitional dilator set over 0.018 inch  guidewire. Contrast administered through the transitional dilator confirmed appropriate access of the renal collecting system. The transitional dilator was exchanged for 10 French multipurpose pigtail drain over 0.035 inch guidewire. 5 mL of purulent urine was aspirated and sent for Gram stain and culture. The drain was secured to skin with suture and connected to bag. Sonographic evaluation of the left kidney demonstrated minimal hydronephrosis. Following local administration, multiple attempts were made to access mid and lower pole calices with a 21 gauge needle utilizing continuous ultrasound guidance, however none were successful. Given only minimal hydronephrosis of the left kidney, the procedure was terminated. IMPRESSION: 1. Successful insertion of 10 French right nephrostomy drain. 2. Unsuccessful attempt at insertion of left nephrostomy drain due to minimal hydronephrosis. If the patient's renal function does not significantly improve, repeat ultrasound should be performed to evaluate the degree of right hydronephrosis. Electronically Signed   By: Aliene Lloyd M.D.    On: 10/20/2023 14:39   CT ABDOMEN PELVIS WO CONTRAST Result Date: 10/18/2023 CLINICAL DATA:  Kidney failure stenting EXAM: CT ABDOMEN AND PELVIS WITHOUT CONTRAST TECHNIQUE: Multidetector CT imaging of the abdomen and pelvis was performed following the standard protocol without IV contrast. RADIATION DOSE REDUCTION: This exam was performed according to the departmental dose-optimization program which includes automated exposure control, adjustment of the mA and/or kV according to patient size and/or use of iterative reconstruction technique. COMPARISON:  CT 10/09/2023, 09/28/2023 FINDINGS: Lower chest: Lung bases demonstrate minimal atelectasis or scarring. No acute airspace disease. Differential blood cardiac pool suggesting anemia Hepatobiliary: Stable hepatic cysts. No calcified gallstone or biliary dilatation. Pancreas: Unremarkable. No pancreatic ductal dilatation or surrounding inflammatory changes. Spleen: Normal in size without focal abnormality. Adrenals/Urinary Tract: Adrenal glands are within normal limits. Bilateral ureteral stent with proximal pigtails in the renal collecting system and distal pigtails in the posterior bladder. Small amount of air within the renal collecting systems and adjacent to the proximal left and mid bilateral ureteral stents. Small amount of air within the urinary bladder. Mild hydronephrosis probably similar compared to the most recent prior CT. Stomach/Bowel: Stomach nonenlarged. No dilated small bowel. No acute bowel wall thickening. Negative appendix. Vascular/Lymphatic: Aortic atherosclerosis. Extensive bulky retroperitoneal, pelvic, iliac and inguinal adenopathy corresponding to history of lymphoma. Reproductive: Clips at the prostate. Extensive scrotal edema and fluid. Other: Negative for free air. Small pelvic effusion. Extensive subcutaneous edema. Musculoskeletal: No acute osseous abnormality. Decreased hyperdense psoas muscle enlargement compared to most recent  prior CT. IMPRESSION: 1. Bilateral ureteral stents in place. Similar degree of mild bilateral hydronephrosis. New small amount of air within the renal collecting systems and urinary bladder, correlate for recent instrumentation. If none recently, consider gas-forming infection with reflux of air into the renal collecting systems and ureters. 2. Extensive bulky retroperitoneal, pelvic, iliac and inguinal adenopathy corresponding to history of lymphoma. 3. Extensive subcutaneous edema and scrotal edema/fluid. Small pelvic effusion. 4. Decreased hyperdense psoas muscle enlargement compared to prior likely resolving intramuscular hematomas. 5. Aortic atherosclerosis. Aortic Atherosclerosis (ICD10-I70.0). Electronically Signed   By: Luke Bun M.D.   On: 10/18/2023 19:06   CT ABDOMEN PELVIS WO CONTRAST Result Date: 10/09/2023 CLINICAL DATA:  Retroperitoneal bleed suspected.  Lymphoma. EXAM: CT ABDOMEN AND PELVIS WITHOUT CONTRAST TECHNIQUE: Multidetector CT imaging of the abdomen and pelvis was performed following the standard protocol without IV contrast. RADIATION DOSE REDUCTION: This exam was performed according to the departmental dose-optimization program which includes automated exposure control, adjustment of the mA and/or kV according to patient size and/or use  of iterative reconstruction technique. COMPARISON:  CT abdomen pelvis dated 09/28/2023. FINDINGS: Evaluation of this exam is limited in the absence of intravenous contrast. Lower chest: Partially visualized small bilateral pleural effusions with partial compressive atelectasis of the lower lobes. Pneumonia is not excluded. The tip of a central venous line noted at the cavoatrial junction. There is hypoattenuation of the cardiac blood pool suggestive of anemia. Clinical correlation is recommended. No intra-abdominal free air.  Small ascites. Hepatobiliary: Several small liver cysts. No biliary dilatation. No calcified gallstone. Pancreas:  Unremarkable. No pancreatic ductal dilatation or surrounding inflammatory changes. Spleen: Normal in size without focal abnormality. Adrenals/Urinary Tract: Bilateral pigtail ureteral stents noted with proximal tips in the renal pelvis and distal end in the urinary bladder. There is mild bilateral hydronephrosis similar to prior CT. No stone noted. The urinary bladder is decompressed around a Foley catheter. Stomach/Bowel: Small hiatal hernia. Mildly thickened small bowel loops, likely related to ascites and anasarca. Enteritis is less likely. There is no bowel obstruction. The appendix is normal. Vascular/Lymphatic: Mild aortoiliac atherosclerotic disease. The IVC is unremarkable. No portal venous gas. Bulky retroperitoneal and bilateral pelvic sidewall adenopathy. Right inguinal adenopathy as seen previously. Reproductive: Several biopsy clips in the prostate gland. Other: There is diffuse subcutaneous edema and anasarca, significantly progressed since the prior CT. Enlargement of the psoas muscles with ill-defined higher attenuation suspicious for intramuscular hemorrhage. Musculoskeletal: Osteopenia with degenerative changes of the spine. No acute osseous pathology. IMPRESSION: 1. Enlargement of the psoas muscles suspicious for intramuscular hemorrhage. 2. Bulky retroperitoneal and bilateral pelvic sidewall adenopathy. 3. Bilateral pigtail ureteral stents with mild bilateral hydronephrosis similar to prior CT. 4. Small ascites. Diffuse subcutaneous edema and anasarca, significantly progressed since the prior CT. 5. Partially visualized small bilateral pleural effusions with partial compressive atelectasis of the lower lobes. Pneumonia is not excluded. 6.  Aortic Atherosclerosis (ICD10-I70.0). Electronically Signed   By: Vanetta Chou M.D.   On: 10/09/2023 20:26   VAS US  LOWER EXTREMITY VENOUS (DVT) Result Date: 10/07/2023  Lower Venous DVT Study Patient Name:  ABDURAHMAN RUGG  Date of Exam:   10/06/2023  Medical Rec #: [id number]     Accession #:    [id number] Date of Birth: Dec 19, 1945     Patient Gender: M Patient Age:   22 years Exam Location:  [facility] Procedure:      VAS US  LOWER EXTREMITY VENOUS (DVT) Referring Phys: COLEN GRIMES --------------------------------------------------------------------------------  Indications: Swelling, and pulmonary embolism.  Risk Factors: Cancer. Limitations: Body habitus and poor ultrasound/tissue interface. Comparison Study: No prior studies. Performing Technologist: Cordella Collet RVT  Examination Guidelines: A complete evaluation includes B-mode imaging, spectral Doppler, color Doppler, and power Doppler as needed of all accessible portions of each vessel. Bilateral testing is considered an integral part of a complete examination. Limited examinations for reoccurring indications may be performed as noted. The reflux portion of the exam is performed with the patient in reverse Trendelenburg.  +---------+---------------+---------+-----------+----------+-------------------+ RIGHT    CompressibilityPhasicitySpontaneityPropertiesThrombus Aging      +---------+---------------+---------+-----------+----------+-------------------+ CFV      Partial        Yes      Yes                  Acute               +---------+---------------+---------+-----------+----------+-------------------+ FV Prox  None           No       No  Acute               +---------+---------------+---------+-----------+----------+-------------------+ FV Mid   None           No       No                   Acute               +---------+---------------+---------+-----------+----------+-------------------+ FV DistalNone           No       No                   Acute               +---------+---------------+---------+-----------+----------+-------------------+ PFV                                                   Not well visualized  +---------+---------------+---------+-----------+----------+-------------------+ POP      Full           Yes      Yes                                      +---------+---------------+---------+-----------+----------+-------------------+ PTV      Full                                                             +---------+---------------+---------+-----------+----------+-------------------+ PERO     Full                                                             +---------+---------------+---------+-----------+----------+-------------------+ EIV                     Yes      Yes                                      +---------+---------------+---------+-----------+----------+-------------------+ The EIV appears patent.  +---------+---------------+---------+-----------+----------+--------------+ LEFT     CompressibilityPhasicitySpontaneityPropertiesThrombus Aging +---------+---------------+---------+-----------+----------+--------------+ CFV      Full           Yes      Yes                                 +---------+---------------+---------+-----------+----------+--------------+ SFJ      Full                                                        +---------+---------------+---------+-----------+----------+--------------+ FV Prox  Full                                                        +---------+---------------+---------+-----------+----------+--------------+  FV Mid   Full                                                        +---------+---------------+---------+-----------+----------+--------------+ FV DistalFull                                                        +---------+---------------+---------+-----------+----------+--------------+ PFV      Full                                                        +---------+---------------+---------+-----------+----------+--------------+ POP      None           No       No                    Acute          +---------+---------------+---------+-----------+----------+--------------+ PTV      Full                                                        +---------+---------------+---------+-----------+----------+--------------+ PERO     Full                                                        +---------+---------------+---------+-----------+----------+--------------+ Gastroc  Full                                                        +---------+---------------+---------+-----------+----------+--------------+     Summary: RIGHT: - Findings consistent with acute deep vein thrombosis involving the right common femoral vein, and right femoral vein.  - No cystic structure found in the popliteal fossa.  LEFT: - Findings consistent with acute deep vein thrombosis involving the left popliteal vein.  - No cystic structure found in the popliteal fossa.  *See table(s) above for measurements and observations. Electronically signed by Norman Serve on 10/07/2023 at 8:57:49 AM.    Final    NM Pulmonary Perfusion Result Date: 10/06/2023 CLINICAL DATA:  Aggressive shortness of breath. Left groin swelling. Severe acute kidney injury. Mantle cell lymphoma. EXAM: NUCLEAR MEDICINE PERFUSION LUNG SCAN TECHNIQUE: Perfusion images were obtained in multiple projections after intravenous injection of radiopharmaceutical. Ventilation scans intentionally deferred if perfusion scan and chest x-ray adequate for interpretation during COVID 19 epidemic. RADIOPHARMACEUTICALS:  3.95 mCi Tc-53m MAA IV COMPARISON:  Portable chest obtained earlier today. FINDINGS: Moderate-sized subsegmental perfusion defects in the anteromedial left upper lobe, lateral right lower lobe and anteromedial right middle lobe. No corresponding  abnormality at these locations on the chest radiograph. IMPRESSION: Three moderate-sized subsegmental perfusion defects with no corresponding radiographic abnormality, compatible with pulmonary  embolism. Electronically Signed   By: Elspeth Bathe M.D.   On: 10/06/2023 14:47   US  RENAL Result Date: 10/06/2023 CLINICAL DATA:  Hydronephrosis.  Lymphoma. EXAM: RENAL / URINARY TRACT ULTRASOUND COMPLETE COMPARISON:  CT 09/28/2023 FINDINGS: Right Kidney: Renal measurements: 10.1 x 4.8 x 6.0 cm = volume: 150 mL. Normal echogenicity. Mild hydronephrosis. Left Kidney: Renal measurements: 12.0 x 5.4 x 5.7 cm = volume: 182 mL. Normal echogenicity. Mild hydronephrosis. Bladder: Foley catheter within. Other: None. IMPRESSION: Similar mild bilateral hydronephrosis. Electronically Signed   By: Rockey Kilts M.D.   On: 10/06/2023 09:31   DG CHEST PORT 1 VIEW Result Date: 10/06/2023 CLINICAL DATA:  Pneumonia and shortness of breath EXAM: PORTABLE CHEST 1 VIEW COMPARISON:  09/28/2023 FINDINGS: Right Port-A-Cath tip at superior caval/atrial junction. Midline trachea. Patient rotated minimally right. Borderline cardiomegaly. Tortuous thoracic aorta. No pleural effusion or pneumothorax. Mild/subtle density projecting along the left heart border and lateral left hemidiaphragm. Right lung clear. IMPRESSION: Subtle density projecting along the left heart border and left hemidiaphragm. Favored to represent interval atelectasis. Given the clinical history, early or resolving pneumonia could look similar. Depending on clinical concern, consider radiographic follow-up at 5-7 days. Electronically Signed   By: Rockey Kilts M.D.   On: 10/06/2023 09:25   DG C-Arm 1-60 Min-No Report Result Date: 10/05/2023 Fluoroscopy was utilized by the requesting physician.  No radiographic interpretation.   DG OR UROLOGY CYSTO IMAGE ([facility] ONLY) Result Date: 10/05/2023 There is no interpretation for this exam.  This order is for images obtained during a surgical procedure.  Please See Surgeries Tab for more information regarding the procedure.    Microbiology Recent Results (from the past 240 hours)  Resp panel by RT-PCR (RSV, Flu A&B,  Covid) Anterior Nasal Swab     Status: None   Collection Time: November 05, 2023 10:52 AM   Specimen: Anterior Nasal Swab  Result Value Ref Range Status   SARS Coronavirus 2 by RT PCR NEGATIVE NEGATIVE Final    Comment: (NOTE) SARS-CoV-2 target nucleic acids are NOT DETECTED.  The SARS-CoV-2 RNA is generally detectable in upper respiratory specimens during the acute phase of infection. The lowest concentration of SARS-CoV-2 viral copies this assay can detect is 138 copies/mL. A negative result does not preclude SARS-Cov-2 infection and should not be used as the sole basis for treatment or other patient management decisions. A negative result may occur with  improper specimen collection/handling, submission of specimen other than nasopharyngeal swab, presence of viral mutation(s) within the areas targeted by this assay, and inadequate number of viral copies(<138 copies/mL). A negative result must be combined with clinical observations, patient history, and epidemiological information. The expected result is Negative.  Fact Sheet for Patients:  bloggercourse.com  Fact Sheet for Healthcare Providers:  seriousbroker.it  This test is no t yet approved or cleared by the [place]  FDA and  has been authorized for detection and/or diagnosis of SARS-CoV-2 by FDA under an Emergency Use Authorization (EUA). This EUA will remain  in effect (meaning this test can be used) for the duration of the COVID-19 declaration under Section 564(b)(1) of the Act, 21 U.S.C.section 360bbb-3(b)(1), unless the authorization is terminated  or revoked sooner.       Influenza A by PCR NEGATIVE NEGATIVE Final   Influenza B by PCR NEGATIVE NEGATIVE Final    Comment: (NOTE) The Xpert  Xpress SARS-CoV-2/FLU/RSV plus assay is intended as an aid in the diagnosis of influenza from Nasopharyngeal swab specimens and should not be used as a sole basis for treatment. Nasal  washings and aspirates are unacceptable for Xpert Xpress SARS-CoV-2/FLU/RSV testing.  Fact Sheet for Patients: bloggercourse.com  Fact Sheet for Healthcare Providers: seriousbroker.it  This test is not yet approved or cleared by the [place]  FDA and has been authorized for detection and/or diagnosis of SARS-CoV-2 by FDA under an Emergency Use Authorization (EUA). This EUA will remain in effect (meaning this test can be used) for the duration of the COVID-19 declaration under Section 564(b)(1) of the Act, 21 U.S.C. section 360bbb-3(b)(1), unless the authorization is terminated or revoked.     Resp Syncytial Virus by PCR NEGATIVE NEGATIVE Final    Comment: (NOTE) Fact Sheet for Patients: bloggercourse.com  Fact Sheet for Healthcare Providers: seriousbroker.it  This test is not yet approved or cleared by the [place]  FDA and has been authorized for detection and/or diagnosis of SARS-CoV-2 by FDA under an Emergency Use Authorization (EUA). This EUA will remain in effect (meaning this test can be used) for the duration of the COVID-19 declaration under Section 564(b)(1) of the Act, 21 U.S.C. section 360bbb-3(b)(1), unless the authorization is terminated or revoked.  Performed at [facility], [address] [place], [place] [address]     Lab Basic Metabolic Panel: Recent Labs  Lab Dec 01, 2023 [phone] 12/01/2023 1158 2023-12-01 1404  NA 128*  --  125*  K 7.0*  --  5.6*  CL 96*  --  95*  CO2 10*  --  11*  GLUCOSE 167*  --  175*  BUN 92*  --  91*  CREATININE 4.66*  --  4.44*  CALCIUM  8.1*  --  7.5*  PHOS  --  9.5*  --    Liver Function Tests: Recent Labs  Lab 12/01/23 0806  AST 85*  ALT 18  ALKPHOS 50  BILITOT 1.0  PROT 4.7*  ALBUMIN  2.4*   No results for input(s): LIPASE, AMYLASE in the last 168 hours. No results for input(s): AMMONIA in the  last 168 hours. CBC: Recent Labs  Lab 01-Dec-2023 0806  WBC 0.8*  NEUTROABS 0.5*  HGB 7.5*  HCT 25.0*  MCV 96.5  PLT 55*   Cardiac Enzymes: No results for input(s): CKTOTAL, CKMB, CKMBINDEX, TROPONINI in the last 168 hours. Sepsis Labs: Recent Labs  Lab 2023-12-01 0806 2023-12-01 1052 12-01-2023 1159  PROCALCITON  --  >150.00  --   WBC 0.8*  --   --   LATICACIDVEN  --  8.4* 8.0*    Procedures/Operations  1/2: Endotracheal intubation 1/2: Left internal jugular Trialysis catheter insertion 1/2: Left radial Arterial Line insertion      Inge Lecher, AGACNP-BC [facility] Prefer epic messenger for cross cover needs If after hours, please call E-link  Inge JONETTA Lecher 12/01/23, 6:37 PM

## 2023-12-01 NOTE — Sepsis Progress Note (Signed)
 eLink is following this Code Sepsis.

## 2023-12-01 NOTE — Procedures (Signed)
 Arterial Catheter Insertion Procedure Note  Gryphon Bonifield  [id number]  09-04-1946  Date:11-29-2023  Time:1:57 PM    Provider Performing: Inge JONETTA Lecher    Procedure: Insertion of Arterial Line (63379) with US  guidance (23062)   Indication(s) Blood pressure monitoring and/or need for frequent ABGs  Consent Unable to obtain consent due to emergent nature of procedure.  Anesthesia None   Time Out Verified patient identification, verified procedure, site/side was marked, verified correct patient position, special equipment/implants available, medications/allergies/relevant history reviewed, required imaging and test results available.   Sterile Technique Maximal sterile technique including full sterile barrier drape, hand hygiene, sterile gown, sterile gloves, mask, hair covering, sterile ultrasound probe cover (if used).   Procedure Description Area of catheter insertion was cleaned with chlorhexidine  and draped in sterile fashion. With real-time ultrasound guidance an arterial catheter was placed into the left radial artery.  Appropriate arterial tracings confirmed on monitor.     Complications/Tolerance None; patient tolerated the procedure well.   EBL Minimal   Specimen(s) None    Inge Lecher, AGACNP-BC [facility] Prefer epic messenger for cross cover needs If after hours, please call E-link

## 2023-12-01 NOTE — Progress Notes (Signed)
   11/23/2023 1200  Spiritual Encounters  Type of Visit Initial  Care provided to: Pt and family  Referral source Nurse (RN/NT/LPN)  Reason for visit Routine spiritual support  OnCall Visit Yes   Chaplain met with patient and family providing compassionate presence, active listening, empathy and prayer. Chaplain spiritual support services remain available as the need arises.

## 2023-12-01 NOTE — ED Notes (Signed)
 This NT assisted RN Megan and NT Mykia with cleaning BM and obtaining a rectal temperature. Rectal temp is 98.2 at this time. New chuck pads placed under pt.

## 2023-12-01 NOTE — Progress Notes (Signed)
 Pt was moved to comfort care today-previous chaplain passed on new page at 4:30 for support for family. Day time chaplain visited with family briefly in ED.   Visited with pt and family in waiting room and at bedside. Family expressed their heartache and sadness over pt health decline. Pt wife present. Offered compassionate presence and listening ear. Family is very open to chaplain care and appreciates the check ins. Spent significant time with family floating between room and waiting room. Around if further needs arise please don't hesitate to contact chaplain services

## 2023-12-01 NOTE — ED Provider Notes (Signed)
 [facility] Provider Note   Event Date/Time   First MD Initiated Contact with Patient November 24, 2023 0800     (approximate) History  No chief complaint on file.  HPI Jerry Tran is a 78 y.o. male with a past medical history of CKD and CHF who presents from a long-term care facility via EMS after being found unresponsive with hypoglycemia to the 30s and hypoxia to the 80s.  Patient did not have any IV established and therefore only glucagon was given prior to arrival. ROS: Unable to assess   Physical Exam  Triage Vital Signs: ED Triage Vitals [11/24/2023 0750]  Encounter Vitals Group     BP (!) 136/121     Systolic BP Percentile      Diastolic BP Percentile      Pulse Rate (!) 120     Resp (!) 30     Temp      Temp src      SpO2 100 %     Weight      Height      Head Circumference      Peak Flow      Pain Score      Pain Loc      Pain Education      Exclude from Growth Chart    Most recent vital signs: Vitals:   2023-11-24 0840 11-24-23 0845  BP: (!) 86/54 (!) 83/41  Pulse:    Resp: 10 16  SpO2:     General: Unresponsive on stretcher with GCS of 8 CV:  Good peripheral perfusion.  Peripheral edema Resp:  Increased effort.  Tachypneic Abd:  No distention.  Pitting edema Other:  Elderly African-American male laying unresponsive on stretcher in respiratory distress ED Results / Procedures / Treatments  Labs (all labs ordered are listed, but only abnormal results are displayed) Labs Reviewed  CBC WITH DIFFERENTIAL/PLATELET - Abnormal; Notable for the following components:      Result Value   WBC 0.8 (*)    RBC 2.59 (*)    Hemoglobin 7.5 (*)    HCT 25.0 (*)    RDW 18.4 (*)    Platelets 55 (*)    nRBC 2.6 (*)    Neutro Abs 0.5 (*)    Lymphs Abs 0.3 (*)    All other components within normal limits  COMPREHENSIVE METABOLIC PANEL - Abnormal; Notable for the following components:   Sodium 128 (*)    Potassium 7.0 (*)    Chloride 96 (*)    CO2 10  (*)    Glucose, Bld 167 (*)    BUN 92 (*)    Creatinine, Ser 4.66 (*)    Calcium  8.1 (*)    Total Protein 4.7 (*)    Albumin  2.4 (*)    AST 85 (*)    GFR, Estimated 12 (*)    Anion gap 22 (*)    All other components within normal limits  BLOOD GAS, ARTERIAL - Abnormal; Notable for the following components:   pH, Arterial <6.95 (*)    pCO2 arterial 49 (*)    pO2, Arterial 401 (*)    Bicarbonate 10.1 (*)    Acid-base deficit 22.5 (*)    All other components within normal limits  CBG MONITORING, ED - Abnormal; Notable for the following components:   Glucose-Capillary <10 (*)    All other components within normal limits  CBG MONITORING, ED - Abnormal; Notable for the following components:   Glucose-Capillary 157 (*)  All other components within normal limits  CBG MONITORING, ED - Abnormal; Notable for the following components:   Glucose-Capillary 122 (*)    All other components within normal limits  RESP PANEL BY RT-PCR (RSV, FLU A&B, COVID)  RVPGX2  CULTURE, BLOOD (ROUTINE X 2)  CULTURE, BLOOD (ROUTINE X 2)  APTT  LACTIC ACID, PLASMA  LACTIC ACID, PLASMA  PROTIME-INR  URINALYSIS, W/ REFLEX TO CULTURE (INFECTION SUSPECTED)  PATHOLOGIST SMEAR REVIEW  LACTATE DEHYDROGENASE  PHOSPHORUS  BASIC METABOLIC PANEL  PROCALCITONIN  CORTISOL  BLOOD GAS, ARTERIAL  FIBRINOGEN   D-DIMER, QUANTITATIVE  CBG MONITORING, ED  CBG MONITORING, ED  CBG MONITORING, ED  CBG MONITORING, ED  CBG MONITORING, ED  CBG MONITORING, ED  CBG MONITORING, ED  CBG MONITORING, ED  CBG MONITORING, ED  CBG MONITORING, ED  CBG MONITORING, ED  CBG MONITORING, ED  CBG MONITORING, ED  CBG MONITORING, ED  TROPONIN I (HIGH SENSITIVITY)   EKG ED ECG REPORT I, Artist MARLA Kerns, the attending physician, personally viewed and interpreted this ECG. Date: 11/15/23 EKG Time: 1817 Rate: 162 Rhythm: Tachycardic sinus rhythm QRS Axis: normal Intervals: normal ST/T Wave abnormalities: normal Narrative  Interpretation: Tachycardic sinus rhythm.  No evidence of acute ischemia RADIOLOGY ED MD interpretation: Single view portable chest x-ray independently interpreted and -Agree with radiology assessment Official radiology report(s): DG Chest Portable 1 View Result Date: Nov 15, 2023 CLINICAL DATA:  Status post intubation. EXAM: PORTABLE CHEST 1 VIEW COMPARISON:  10/06/2023 FINDINGS: Low lung volume. Mild central pulmonary vascular congestion, which is likely accentuated by low lung volume. No frank pulmonary edema. There are nonspecific opacities at the lung bases, which may represent fibrosis versus atelectasis. Bilateral lungs are otherwise clear. No acute consolidation or lung collapse. Bilateral costophrenic angles are clear. Normal cardio-mediastinal silhouette. No acute osseous abnormalities. The soft tissues are within normal limits. Interval placement of endotracheal tube with its tip approximately 3.9 cm above the carina. Redemonstration of right-sided CT Port-A-Cath with its tip overlying the cavoatrial junction region. IMPRESSION: 1. Interval placement of endotracheal tube with its tip proximally 3.9 cm above the carina. 2. Mild central pulmonary vascular congestion. No frank pulmonary edema. No acute pulmonary abnormality. Electronically Signed   By: Ree Molt M.D.   On: Nov 15, 2023 08:37   PROCEDURES: Critical Care performed: Yes, see critical care procedure note(s) Procedure Name: Intubation Date/Time: 15-Nov-2023 10:26 AM  Performed by: Kerns Artist MARLA, MDPre-anesthesia Checklist: Patient identified, Patient being monitored, Emergency Drugs available, Timeout performed and Suction available Oxygen Delivery Method: Non-rebreather mask Preoxygenation: Pre-oxygenation with 100% oxygen Induction Type: Rapid sequence Ventilation: Mask ventilation without difficulty Laryngoscope Size: Glidescope Grade View: Grade II Tube size: 7.5 mm Number of attempts: 1 Airway Equipment and Method:  Video-laryngoscopy Placement Confirmation: ETT inserted through vocal cords under direct vision, CO2 detector and Breath sounds checked- equal and bilateral Dental Injury: Teeth and Oropharynx as per pre-operative assessment     CRITICAL CARE Performed by: Staysha Truby K Josef Tourigny  Total critical care time: 51 minutes  Critical care time was exclusive of separately billable procedures and treating other patients.  Critical care was necessary to treat or prevent imminent or life-threatening deterioration.  Critical care was time spent personally by me on the following activities: development of treatment plan with patient and/or surrogate as well as nursing, discussions with consultants, evaluation of patient's response to treatment, examination of patient, obtaining history from patient or surrogate, ordering and performing treatments and interventions, ordering and review of laboratory studies, ordering and review  of radiographic studies, pulse oximetry and re-evaluation of patient's condition.  MEDICATIONS ORDERED IN ED: Medications  dextrose  50 % solution (  Not Given 11/27/2023 0847)  rocuronium  (ZEMURON ) 100 MG/10ML injection (  Not Given 11-27-2023 0847)  propofol  (DIPRIVAN ) 1000 MG/100ML infusion (has no administration in time range)  vasopressin  (PITRESSIN) 20 Units in 100 mL (0.2 unit/mL) infusion-*FOR SHOCK* (0.04 Units/min Intravenous New Bag/Given Nov 27, 2023 0820)  lactated ringers  infusion (has no administration in time range)  lactated ringers  bolus 1,000 mL (has no administration in time range)    And  lactated ringers  bolus 500 mL (has no administration in time range)  azithromycin  (ZITHROMAX ) 500 mg in sodium chloride  0.9 % 250 mL IVPB (has no administration in time range)  norepinephrine  (LEVOPHED ) 4mg  in (0.016 mg/mL) premix infusion (60 mcg/min Intravenous Rate/Dose Change 2023/11/27 0816)  ketamine  (KETALAR ) injection ( Intravenous Canceled Entry 11/27/23 0800)  rocuronium  (ZEMURON )  injection (100 mg Intravenous Given 11-27-23 0800)  dextrose  50 % solution (1 ampule Intravenous Given 27-Nov-2023 0745)  dextrose  10 % infusion ( Intravenous New Bag/Given 11/27/2023 0915)  calcium  gluconate 1 g/ 50 mL sodium chloride  IVPB (has no administration in time range)  sodium zirconium cyclosilicate  (LOKELMA ) packet 10 g (has no administration in time range)  norepinephrine  (LEVOPHED ) 4-5 MG/250ML-% infusion SOLN (has no administration in time range)  sodium bicarbonate  injection 100 mEq (has no administration in time range)  sodium bicarbonate  150 mEq in dextrose  5 % 1,150 mL infusion (has no administration in time range)  insulin  aspart (novoLOG ) injection 10 Units (has no administration in time range)  dextrose  50 % solution 50 mL (has no administration in time range)  ceFEPIme  (MAXIPIME ) 2 g in sodium chloride  0.9 % 100 mL IVPB (has no administration in time range)  vancomycin  (VANCOREADY) IVPB 1500 mg/300 mL (has no administration in time range)    And  vancomycin  (VANCOCIN ) IVPB 1000 mg/200 mL premix (has no administration in time range)  docusate sodium  (COLACE) capsule 100 mg (has no administration in time range)  polyethylene glycol (MIRALAX  / GLYCOLAX ) packet 17 g (has no administration in time range)  pantoprazole  (PROTONIX ) injection 40 mg (has no administration in time range)  docusate (COLACE) 50 MG/5ML liquid 100 mg (has no administration in time range)  polyethylene glycol (MIRALAX  / GLYCOLAX ) packet 17 g (has no administration in time range)  fentaNYL  (SUBLIMAZE ) injection 25 mcg (has no administration in time range)  fentaNYL  (SUBLIMAZE ) injection 25-100 mcg (has no administration in time range)  midazolam  (VERSED ) injection 1-2 mg (has no administration in time range)  hydrocortisone  sodium succinate  (SOLU-CORTEF ) 100 MG injection 50 mg (has no administration in time range)  cefTRIAXone  (ROCEPHIN ) 2 g in sodium chloride  0.9 % 100 mL IVPB (2 g Intravenous New Bag/Given 2023-11-27  0851)  sodium chloride  0.9 % bolus 2,000 mL (2,000 mLs Intravenous New Bag/Given 11-27-23 0838)   IMPRESSION / MDM / ASSESSMENT AND PLAN / ED COURSE  I reviewed the triage vital signs and the nursing notes.                             The patient is on the cardiac monitor to evaluate for evidence of arrhythmia and/or significant heart rate changes. Patient's presentation is most consistent with acute presentation with potential threat to life or bodily function.  This patient presents to the ED for concern of altered mental status, this involves an extensive number of treatment options, and  is a complaint that carries with it a high risk of complications and morbidity.  The differential diagnosis includes sepsis, CVA, hepatic encephalopathy, metabolic encephalopathy, medication overdose Co morbidities that complicate the patient evaluation  CKD, CHF, hypertension, dyslipidemia, paroxysmal atrial fibrillation Additional history obtained:  Additional history obtained from information from long-term care facility  External records from outside source obtained and reviewed including [facility] Lab Tests:  I Ordered, and personally interpreted labs.  The pertinent results include:  Leukopenia with WBC 0.8, hemoglobin 7.5, hematocrit 25, potassium 7.0, sodium 128, creatinine 4.6 Imaging Studies ordered:  I ordered imaging studies including chest x-ray  I independently visualized and interpreted imaging which showed ET tube in adequate positioning, pulmonary vascular congestion centrally, central catheter in place  I agree with the radiologist interpretation Cardiac Monitoring: / EKG:  The patient was maintained on a cardiac monitor.  I personally viewed and interpreted the cardiac monitored which showed an underlying rhythm of: Sinus tachycardia Consultations Obtained:  I requested consultation with the critical care team and nephrology,  and discussed lab and imaging findings as well as  pertinent plan - they recommend: Admission and dialysis Problem List / ED Course / Critical interventions / Medication management  Acute on chronic renal failure, altered mental status, acute hypoxic respiratory failure  I ordered medication including calcium  gluconate, Kayexalate for hyperkalemia, I also intubated this patient for acute hypoxic respiratory failure as well as acute altered mental status  Reevaluation of the patient after these medicines showed that the patient stayed the same  I have reviewed the patients home medicines and have made adjustments as needed Dispo: Admit to the ICU       FINAL CLINICAL IMPRESSION(S) / ED DIAGNOSES   Final diagnoses:  Acute renal failure superimposed on chronic kidney disease, unspecified acute renal failure type, unspecified CKD stage (HCC)  Acute respiratory failure with hypoxia (HCC)  Altered mental status, unspecified altered mental status type  Hyperkalemia   Rx / DC Orders   ED Discharge Orders     None      Note:  This document was prepared using Dragon voice recognition software and may include unintentional dictation errors.   Lyvia Mondesir K, MD 11-16-23 1031

## 2023-12-01 NOTE — ED Notes (Signed)
Levo increased to 60

## 2023-12-01 NOTE — IPAL (Signed)
 GOALS OF CARE FAMILY CONFERENCE   Current clinical status, hospital findings and medical plan was reviewed with family.   PRESENT IN MEETING ARE 7 FAMILY MEMBERS INCLUDING POA WIFE JOYCE Sedore  Updated and notified of patients ongoing immediate critical medical problems.   Patient remains unresponsive acutely comatose, with widespread cancer, circulatory shock, severe renal failure, multiple DVTs and Pulmonary emboli, thrombocytopenia, neutropenia, anasarca , suspected acute CVA.  Patient is unable to breathe independently, unable to protect airway and unable to mobilize secretions.    Explained to family course of therapy and the modalities   Patient with Progressive multiorgan failure with high probability of immediate in hospital death.   Family is appreciative of care and relate understanding that patient is severely critically ill with anticipation of passing away today.  They have consented and agreed to DNR/DNI  Comfort care Code status   Family are satisfied with Plan of action and management. All questions answered  Additional Critical Care time 35 mins    Halina Picking, M.D.  Pulmonary & Critical Care Medicine  [facility]

## 2023-12-01 NOTE — H&P (Signed)
 NAME:  Jerry Tran, MRN:  [id number], DOB:  [birth year], LOS: 0 ADMISSION DATE:  11-19-23, CONSULTATION DATE:  2023-11-19 REFERRING MD:  Dr. Jossie, CHIEF COMPLAINT:  Unresponsive, hypoglycemia, shock   Brief Pt Description / Synopsis:  78 y.o. male with PMHx significant for Mantle Cell Lymphoma and recent PE and bilateral DVT in late December 2024 (not on [facility] due to Acute blood loss anemia with [facility]) admitted with Acute Metabolic Encephalopathy, profound shock (multifactorial), Acute Hypoxic Respiratory Failure from presumed PE, severe sepsis of unknown etiology but suspecting urinary source, and AKI with severe metabolic acidosis with Hyperkalemia requiring intubation and mechanical ventilation, along with initiation of CRRT.   History of Present Illness:  Jerry Tran is a 78 year old male with a past medical history significant for mantle cell lymphoma with metastasis, CKD, HFpEF who presented from his skilled nursing facility after being found unresponsive with hypoglycemia (CBGs in the 30s), and hypoxic with O2 saturation in the 80s.  Patient is currently unresponsive on the vent and no family currently available, therefore history of obtained from chart review.  Of note he was recently admitted at [facility] from 09/28/2023 through 10/22/2023 for bilateral hydroureteronephrosis secondary to compressive effect from mantle cell lymphoma requiring ureteral stent placement on 12/6.  On 12/7 he was found to have moderate sized subsegmental perfusion defects on VQ scan compatible with pulmonary embolism, along with bilateral LE DVT's. He was started on heparin  drip however developed acute blood loss anemia from hematuria and possible intramuscular hemorrhage in the psoas muscles,  with hemoglobin dropping to 4.1 requiring multiple blood transfusions.  Eventually was discharged to skilled nursing facility with plans to follow-up with urology, oncology, and palliative care.  Patient and family declined  anticoagulation for treatment of PE given his acute blood loss anemia.  As per oncology he was not a candidate for any further treatments, with chemotherapy to be stopped with recommendation for hospice and palliative care.  ED Course: Initial Vital Signs: Pulse 120, respiratory rate 30, blood pressure 136/121 which dropped to 86/54, SpO2 100% Significant Labs: Sodium 128, potassium 7.0, bicarb 10, glucose 167, BUN 92, creatinine 4.66, calcium  8.1, anion gap 22, WBC 0.8, hemoglobin 7.5, platelets 55, D-dimer 17, INR 1.7, high-sensitivity troponin 711, lactic acid 8.4, procalcitonin greater than 150 ABG: pH less than 6.95/pCO2 49/pO2 401/bicarb 10.1 COVID-19 PCR negative Urinalysis and urine drug screen is pending Imaging Chest X-ray>>IMPRESSION: 1. Interval placement of endotracheal tube with its tip proximally 3.9 cm above the carina. 2. Mild central pulmonary vascular congestion. No frank pulmonary edema. No acute pulmonary abnormality. Medications Administered: 3.5 L of IV fluid boluses, Levophed  and vasopressin  drips initiated, IV azithromycin  and ceftriaxone   Given his work of breathing and GCS of 8, ED provider elected to intubate for airway protection.  PCCM is asked to admit for further workup and treatment.  Please see significant hospital events section below for full detailed hospital course.   Pertinent  Medical History   Past Medical History:  Diagnosis Date   Arthritis    Chronic kidney disease, stage 3b (HCC) 12/09/2021   Dyslipidemia    GERD (gastroesophageal reflux disease)    Hypertension    PAF (paroxysmal atrial fibrillation) (HCC)    Prostate cancer (HCC) 03/2021   s/p radiation therapy   PTSD (post-traumatic stress disorder)     Micro Data:  1/2: COVID/RSV/FLU PCR>>negative 1/2: Blood culture x2>> 1/2: Urine>>  Antimicrobials:   Anti-infectives (From admission, onward)    Start     Dose/Rate  Route Frequency Ordered Stop   2023-11-15 1000  vancomycin   (VANCOCIN ) 2,500 mg in sodium chloride  0.9 % 500 mL IVPB  Status:  Discontinued        2,500 mg 262.5 mL/hr over 120 Minutes Intravenous  Once 15-Nov-2023 0949 11/15/23 0951   11-15-23 1000  ceFEPIme  (MAXIPIME ) 2 g in sodium chloride  0.9 % 100 mL IVPB        2 g 200 mL/hr over 30 Minutes Intravenous Every 24 hours 11/15/23 0949     15-Nov-2023 1000  vancomycin  (VANCOREADY) IVPB 1500 mg/300 mL       Placed in And Linked Group   1,500 mg 150 mL/hr over 120 Minutes Intravenous  Once 11-15-23 0951     2023/11/15 1000  vancomycin  (VANCOCIN ) IVPB 1000 mg/200 mL premix       Placed in And Linked Group   1,000 mg 200 mL/hr over 60 Minutes Intravenous  Once 15-Nov-2023 0951     11/15/2023 0830  cefTRIAXone  (ROCEPHIN ) 2 g in sodium chloride  0.9 % 100 mL IVPB        2 g 200 mL/hr over 30 Minutes Intravenous Once 15-Nov-2023 0822 2023-11-15 0921   11-15-2023 0830  azithromycin  (ZITHROMAX ) 500 mg in sodium chloride  0.9 % 250 mL IVPB        500 mg 250 mL/hr over 60 Minutes Intravenous  Once 2023-11-15 9177          Significant Hospital Events: Including procedures, antibiotic start and stop dates in addition to other pertinent events   1/2: Presented to ED unresponsive, hypoglycemic, profound shock.  Intubated by ED provider.  PCCM asked to admit.  Critically ill,multiorgan failure.  Temporary HD catheter placed for anticipation for CRRT.  Nephrology consulted, plans to start CRRT.  Cardiology consulted for elevated troponin, Vascular Surgery consulted for presumed PE and DVT's.  Oncology and Palliative Care consulted, high risk for death. 1/2: Later in the evening, goals of care discussion with family. They wish to transition to COMFORT MEASURES  Interim History / Subjective:  As outlined above  Objective   Blood pressure (!) 83/41, pulse (!) 163, resp. rate 16, SpO2 (!) 77%.    Vent Mode: PRVC FiO2 (%):  [60 %] 60 % Set Rate:  [16 bmp] 16 bmp Vt Set:  [500 mL] 500 mL PEEP:  [5 cmH20] 5 cmH20  No intake or  output data in the 24 hours ending 11/15/2023 1025 There were no vitals filed for this visit.  Examination: General: Critically ill-appearing male, laying in bed, unresponsive, no acute distress HENT: Atraumatic, normocephalic, neck supple, positive JVD, orally intubated Lungs: Coarse breath sounds throughout, even, synchronous with the ventilator Cardiovascular: Tachycardia, regular rhythm, S1-S2, no murmurs, rubs or gallops Abdomen: Soft, nontender, nondistended, no guarding rebound tenderness, bowel sounds positive x 4 Extremities: 2+ edema bilateral lower extremities Neuro: Unresponsive on no sedation, pupils minimally responsive GU: Penis and scrotum with severe edema  Resolved Hospital Problem list     Assessment & Plan:   #Multifactorial Shock: Suspect Septic +/- Obstructive from presumed PE +/- Cardiogenic #Elevated Troponin in setting of demand ischemia vs NSTEMI #Tachycardia: compensatory  #Chronic HFpEF #Presumed PE #Bilateral Lower Extremity DVT's PMHx: A.fib Echocardiogram 10/25/22: LVEF 60-65%, Grade I DD, normal RV systolic function, RV size normal -Continuous cardiac monitoring -Maintain MAP >65 -IV fluids -Vasopressors as needed to maintain MAP goal -Trend lactic acid until normalized -Trend HS Troponin until peaked -Echocardiogram pending -Check Cortisol -Start Stress Dose steroids as requiring Levophed  and Vasopressin  -Start  Heparin  gtt -Cardiology consulted, appreciate input  #Acute Hypoxic Respiratory Failure #Presumed Pulmonary Embolism #Intubated for Airway Protection -Full vent support, implement lung protective strategies -Plateau pressures less than 30 cm H20 -Wean FiO2 & PEEP as tolerated to maintain O2 sats >92% -Follow intermittent Chest X-ray & ABG as needed -Spontaneous Breathing Trials when respiratory parameters met and mental status permits -Implement VAP Bundle -Prn Bronchodilators -ABX as above -Pt is too unstable to travel for CTa  Chest to rule out PE ~ V/Q scan 10/06/23 suggestive of moderate-sized subsegmental perfusion defects compatible with PE ~ wasn't able to tolerate Heparin  gtt at that time due to acute blood loss anemia from hematuria & questionable retroperitoneal hematoma (family declined anticoagulation at discharge) -Obtain venous ultrasound of bilateral lower extremities ~positive for extensive bilateral lower DVTs -Discussed with Dr. Parris, initially hesitant about [facility] given pancytopenia and recent acute blood loss anemia with AC ~ but given critical illness and concern for possible obstructive shock, will start Heparin  drip at low dose and monitor for bleeding given his pancytopenia (HIGH RISK FOR BLEEDING) -Consult Vascular Surgery, appreciate input ~ currently too unstable to travel down for IVC filter placement  #Severe Sepsis of unknown etiology, suspect urinary source (Meets SIRS Criteria: WBC 0.8, HR 140's ) PMHx: Recent Pseudomonas Aeruginosa UTI 10/06/23 -Monitor fever curve -Trend WBC's & Procalcitonin -Follow cultures as above -Continue empiric Cefepime  and Vancomycin  pending cultures & sensitivities  #Acute Kidney Injury #Hyperkalemia #Severe AG Metabolic Acidosis PMHx: Recent bilateral Hydroureteronephrosis from compression form mantle cell metastasis requiring Ureteric Stent Placement on 10/06/23  -Monitor I&O's / urinary output -Follow BMP -Ensure adequate renal perfusion -Avoid nephrotoxic agents as able -Replace electrolytes as indicated ~ Pharmacy following for assistance with electrolyte replacement -Give 2 amps of Bicarb, start Bicarb gtt -Temporizing measures: Ca Gluconate, Bicarb, Insulin  +D50, Lokelma  -Nephrology consulted, appreciate input ~ Trialysis catheter placed in anticipation of needing CRRT ~plan to start CRRT  -Obtain renal US  ~ Once hemodynamically stable, will need CT Abdomen & Pelvis and likely Urology consult  #Pancytopenia #Mantle Cell Lymphoma -Monitor for S/Sx  of bleeding -Trend CBC -SCD's for VTE Prophylaxis  -Transfuse for Hgb <7 -Transfuse Platelets for Platelet count of <10 K , < 50 K with active bleeding, or <100 K with Neurosurgical procedures -Obtain DIC workup -Consult Oncology, appreciate input  #Acute Metabolic Encephalopathy #Sedation needs in setting of mechanical ventilation -Treatment of metabolic derangements as outlined above -Maintain a RASS goal of 0 to -1 -Fentanyl  and Versed  pushes prn to maintain RASS goal -Avoid sedating medications as able -Daily wake up assessment -Check UDS -Will need to obtain CT Head once hemodynamically stable enough to travel off unit    Patient is critically ill with profound shock and multiorgan failure, prognosis is guarded with high risk for further decompensation, cardiac arrest and death.  Given his current critical illness superimposed on mantle cell lymphoma and advanced age, overall prognosis is dismal.   Recommend DNR/DNI status with consideration of comfort measures..  Will consult palliative care to assist with ongoing goals of care conversations.   Best Practice (right click and Reselect all SmartList Selections daily)   Diet/type: NPO DVT prophylaxis: systemic heparin  GI prophylaxis: PPI Lines: Dialysis Catheter, Arterial Line, and yes and it is still needed Foley:  Yes, and it is still needed Code Status:  DNR Last date of multidisciplinary goals of care discussion [1/2]  1/2: Patient's wife and numerous family members updated in conference room.  Labs   CBC: Recent Labs  Lab 11/13/2023 0806  WBC 0.8*  NEUTROABS 0.5*  HGB 7.5*  HCT 25.0*  MCV 96.5  PLT 55*    Basic Metabolic Panel: Recent Labs  Lab 11-13-23 0806  NA 128*  K 7.0*  CL 96*  CO2 10*  GLUCOSE 167*  BUN 92*  CREATININE 4.66*  CALCIUM  8.1*   GFR: Estimated Creatinine Clearance: 17.9 mL/min (A) (by C-G formula based on SCr of 4.66 mg/dL (H)). Recent Labs  Lab 2023/11/13 0806  WBC 0.8*     Liver Function Tests: Recent Labs  Lab 13-Nov-2023 0806  AST 85*  ALT 18  ALKPHOS 50  BILITOT 1.0  PROT 4.7*  ALBUMIN  2.4*   No results for input(s): LIPASE, AMYLASE in the last 168 hours. No results for input(s): AMMONIA in the last 168 hours.  ABG    Component Value Date/Time   PHART <6.95 (LL) 2023/11/13 0813   PCO2ART 49 (H) November 13, 2023 0813   PO2ART 401 (H) 13-Nov-2023 0813   HCO3 10.1 (L) 13-Nov-2023 0813   ACIDBASEDEF 22.5 (H) 13-Nov-2023 0813   O2SAT 99.9 11-13-23 0813     Coagulation Profile: No results for input(s): INR, PROTIME in the last 168 hours.  Cardiac Enzymes: No results for input(s): CKTOTAL, CKMB, CKMBINDEX, TROPONINI in the last 168 hours.  HbA1C: No results found for: HGBA1C  CBG: Recent Labs  Lab 11-13-2023 0736 11-13-2023 0745 11-13-2023 0855 2023-11-13 1023  GLUCAP <10* 157* 89 122*    Review of Systems:   Unable to assess due to AMS   Past Medical History:  He,  has a past medical history of Arthritis, Chronic kidney disease, stage 3b (HCC) (12/09/2021), Dyslipidemia, GERD (gastroesophageal reflux disease), Hypertension, PAF (paroxysmal atrial fibrillation) (HCC), Prostate cancer (HCC) (03/2021), and PTSD (post-traumatic stress disorder).   Surgical History:   Past Surgical History:  Procedure Laterality Date   CYSTOSCOPY W/ RETROGRADES Bilateral 10/05/2023   Procedure: CYSTOSCOPY WITH BILATERAL RETROGRADE PYELOGRAM and BILATERAL STENT PLACEMENT;  Surgeon: Carolee Sherwood JONETTA DOUGLAS, MD;  Location: [facility];  Service: Urology;  Laterality: Bilateral;   IR IMAGING GUIDED PORT INSERTION  11/06/2022   IR NEPHROSTOMY PLACEMENT LEFT  10/20/2023   IR NEPHROSTOMY PLACEMENT RIGHT  10/20/2023   LIPOMA EXCISION Right 12/09/2021   Procedure: RIGHT SUPERCLAVICULAR LYMPH NODE EXCISION;  Surgeon: Teresa Lonni HERO, MD;  Location: [facility] OR;  Service: General;  Laterality: Right;     Social History:   reports that he has quit smoking. His  smoking use included cigarettes. He has a 15 pack-year smoking history. He has never used smokeless tobacco. He reports that he does not currently use alcohol . He reports that he does not currently use drugs after having used the following drugs: Marijuana and Cocaine.   Family History:  His family history is negative for Cancer.   Allergies Allergies  Allergen Reactions   Sildenafil Other (See Comments)    Unknown reaction - reported by [facility]     Home Medications  Prior to Admission medications   Medication Sig Start Date End Date Taking? Authorizing Provider  acyclovir  (ZOVIRAX ) 400 MG tablet TAKE 1 TABLET(400 MG) BY MOUTH DAILY 09/04/23   Onesimo Emaline Brink, MD  allopurinol  (ZYLOPRIM ) 100 MG tablet Take 1 tablet (100 mg total) by mouth daily. 10/02/23   Onesimo Emaline Brink, MD  ammonium lactate (LAC-HYDRIN) 12 % lotion Apply 1 application topically 2 (two) times daily as needed for dry skin.    [provider]  atorvastatin  (LIPITOR) 20 MG tablet Take 10  mg by mouth at bedtime.    [provider]  Cholecalciferol (VITAMIN D3 PO) Take 1 tablet by mouth every morning.    [provider]  cyclobenzaprine  (FLEXERIL ) 5 MG tablet Take 1 tablet (5 mg total) by mouth 3 (three) times daily as needed for muscle spasms. 10/22/23   Jillian Buttery, MD  diclofenac Sodium (VOLTAREN) 1 % GEL Apply 2 g topically 4 (four) times daily as needed (pain).    [provider]  docusate sodium  (COLACE) 100 MG capsule Take 1 capsule (100 mg total) by mouth 2 (two) times daily. 10/22/23   Jillian Buttery, MD  feeding supplement (ENSURE ENLIVE / ENSURE PLUS) LIQD Take 237 mLs by mouth 2 (two) times daily between meals. Patient not taking: Reported on 09/28/2023 12/11/21   Regalado, Belkys A, MD  fluticasone (FLONASE) 50 MCG/ACT nasal spray Place 1 spray into both nostrils daily as needed for allergies or rhinitis.    [provider]  hydroxypropyl methylcellulose /  hypromellose (ISOPTO TEARS / GONIOVISC) 2.5 % ophthalmic solution Place 2 drops into both eyes 2 (two) times daily as needed for dry eyes.    [provider]  lidocaine -prilocaine  (EMLA ) cream Apply to affected area once 10/02/23   Onesimo Emaline Brink, MD  magic mouthwash (multi-ingredient) oral suspension Take 5 mLs by mouth 4 times daily as needed for mouth and throat pain. 04/26/23   Onesimo Emaline Brink, MD  midodrine  (PROAMATINE ) 10 MG tablet Take 1 tablet (10 mg total) by mouth 3 (three) times daily with meals. 10/22/23   Adhikari, Amrit, MD  omeprazole (PRILOSEC) 20 MG capsule Take 20 mg by mouth daily before breakfast.    [provider]  ondansetron  (ZOFRAN ) 8 MG tablet Take 1 tablet (8 mg total) by mouth every 8 (eight) hours as needed for nausea or vomiting. Start on the third day after chemotherapy. 10/02/23   Onesimo Emaline Brink, MD  oxybutynin  (DITROPAN -XL) 10 MG 24 hr tablet Take 1 tablet (10 mg total) by mouth at bedtime. 10/22/23   Adhikari, Amrit, MD  polyethylene glycol (MIRALAX  / GLYCOLAX ) 17 g packet Take 17 g by mouth daily as needed for mild constipation. 10/22/23   Jillian Buttery, MD  predniSONE  (DELTASONE ) 20 MG tablet Take 2 tablets (40 mg total) by mouth daily with breakfast. 10/22/23   Jillian Buttery, MD  prochlorperazine  (COMPAZINE ) 10 MG tablet Take 1 tablet (10 mg total) by mouth every 6 (six) hours as needed for nausea or vomiting. 10/02/23   Kale, Gautam Kishore, MD  senna (SENOKOT) 8.6 MG TABS tablet Take 1 tablet (8.6 mg total) by mouth 2 (two) times daily. 10/22/23   Jillian Buttery, MD  sodium bicarbonate  650 MG tablet Take 1 tablet (650 mg total) by mouth 2 (two) times daily. 10/22/23   Adhikari, Amrit, MD  terbinafine (LAMISIL) 1 % cream Apply 1 application topically 2 (two) times daily as needed (athlete's foot).    [provider]  triamcinolone cream (KENALOG) 0.1 % Apply 1 application topically 2 (two) times daily as needed (rash).     [provider]     Critical care time: 70 minutes     Inge Lecher, AGACNP-BC [facility] Prefer epic messenger for cross cover needs If after hours, please call E-link

## 2023-12-01 NOTE — ED Notes (Signed)
 Vasopressin started @0 .04U/122ml

## 2023-12-01 NOTE — Sepsis Progress Note (Addendum)
 9068 Secure message sent to bedside RN inquiring about timing of blood cultures and lactic acid blood draws. Will monitor from eLink.  1215 Secure message from beside RN Meghan stating that pt was very unstable and did not have an available line to draw blood cultures or lactic acid. Due to patient's condition, antibiotics given prior to blood draws.

## 2023-12-01 NOTE — ED Notes (Signed)
 D50 given.

## 2023-12-01 NOTE — ED Notes (Signed)
 Maxed Levo, verbal order by MD Vicente Males.

## 2023-12-01 NOTE — ED Notes (Signed)
100 mg of ketamine given

## 2023-12-01 NOTE — Procedures (Signed)
 Central Venous Catheter Insertion Procedure Note  Jerry Tran  [id number]  11-22-1945  Date:11/23/2023  Time:10:24 AM   Provider Performing:Shontel Santee D Shellia   Procedure: Insertion of Non-tunneled Central Venous Catheter(36556)with US  guidance (23062)    Indication(s) Medication administration, Difficult access, and Hemodialysis  Consent Unable to obtain consent due to emergent nature of procedure.  Anesthesia Topical only with 1% lidocaine    Timeout Verified patient identification, verified procedure, site/side was marked, verified correct patient position, special equipment/implants available, medications/allergies/relevant history reviewed, required imaging and test results available.  Sterile Technique Maximal sterile technique including full sterile barrier drape, hand hygiene, sterile gown, sterile gloves, mask, hair covering, sterile ultrasound probe cover (if used).  Procedure Description Area of catheter insertion was cleaned with chlorhexidine  and draped in sterile fashion.   With real-time ultrasound guidance a HD catheter was placed into the left internal jugular vein.  Nonpulsatile blood flow and easy flushing noted in all ports.  The catheter was sutured in place and sterile dressing applied.  Complications/Tolerance None; patient tolerated the procedure well. Chest X-ray is ordered to verify placement for internal jugular or subclavian cannulation.  Chest x-ray is not ordered for femoral cannulation.  EBL Minimal  Specimen(s) None    Line inserted to the 20 cm mark. BIOPATCH applied to the insertion site.    Inge Shellia, AGACNP-BC  Pulmonary & Critical Care Prefer epic messenger for cross cover needs If after hours, please call E-link

## 2023-12-01 NOTE — Progress Notes (Signed)
 Pharmacy Antibiotic Note  Jerry Tran is a 78 y.o. male admitted on 12/01/2023 with sepsis. Patient was brought in by EMS from [facility]. He was found unresponsive. 130 HR, temp 101.1 axillary, BP 120/60. Pharmacy has been consulted for cefepime  and vancomycin  dosing.  Patient given ceftriaxone  2 g IV x 1 and also has azithromycin  500 mg IV x 1 ordered.  Plan: Give vancomycin  2500 mg IV x1. Check random vanc level tomorrow given AKI (SCr 4.66, baseline 2.3 on 10/22/23) Start Cefepime  2 g IV Q24H Continue to monitor renal function and follow culture results     No data recorded.  Recent Labs  Lab Dec 01, 2023 0806  WBC 0.8*  CREATININE 4.66*    Estimated Creatinine Clearance: 17.9 mL/min (A) (by C-G formula based on SCr of 4.66 mg/dL (H)).    Allergies  Allergen Reactions   Sildenafil Other (See Comments)    Unknown reaction - reported by [facility]    Antimicrobials this admission: 1/2 ceftriaxone  x 1 1/2 azithromycin  x 1 ordered (not given yet) 1/2 cefepime >> 1/2 vancomycin >>  Dose adjustments this admission: None  Microbiology results: None from this admission; blood cultures ordered  Thank you for allowing pharmacy to be a part of this patient's care.  Lum VEAR Mania, PharmD Clinical Pharmacist December 01, 2023 9:40 AM

## 2023-12-01 NOTE — Consult Note (Signed)
 [facility]  [phone] [phone]  ID: Madeleine Alas OB: 1946/02/18  MR#: [id number]  CSN#:[id number]  Patient Care Team: [facility], [facility] as PCP - General (General Practice) Onesimo Emaline Brink, MD as Consulting Physician (Hematology)  CHIEF COMPLAINT: Relapsed, refractory mantle cell lymphoma  INTERVAL HISTORY: Patient is a 78 year old male with a history of relapsed, refractory mantle cell lymphoma.  Received multiple chemotherapy regimens in the past, but more recently no further treatment options were available.  Hospice had been previously discussed by report the patient and the family were not ready to make this transition.  Patient presented to the emergency room earlier this morning after being found unresponsive at his long term care facility.  He was significantly hypoglycemic and hypoxic and was subsequently intubated given his respiratory distress.  There is concern of tumor lysis syndrome and oncology was consulted for further evaluation.  Family is at bedside, review of systems is unobtainable.  REVIEW OF SYSTEMS:   Review of Systems  Unable to perform ROS: Intubated    PAST MEDICAL HISTORY: Past Medical History:  Diagnosis Date   Arthritis    Chronic kidney disease, stage 3b (HCC) 12/09/2021   Dyslipidemia    GERD (gastroesophageal reflux disease)    Hypertension    PAF (paroxysmal atrial fibrillation) (HCC)    Prostate cancer (HCC) 03/2021   s/p radiation therapy   PTSD (post-traumatic stress disorder)     PAST SURGICAL HISTORY: Past Surgical History:  Procedure Laterality Date   CYSTOSCOPY W/ RETROGRADES Bilateral 10/05/2023   Procedure: CYSTOSCOPY WITH BILATERAL RETROGRADE PYELOGRAM and BILATERAL STENT PLACEMENT;  Surgeon: Carolee Sherwood JONETTA DOUGLAS, MD;  Location: [facility];  Service: Urology;  Laterality: Bilateral;   IR IMAGING GUIDED PORT INSERTION  11/06/2022   IR NEPHROSTOMY PLACEMENT LEFT  10/20/2023   IR NEPHROSTOMY  PLACEMENT RIGHT  10/20/2023   LIPOMA EXCISION Right 12/09/2021   Procedure: RIGHT SUPERCLAVICULAR LYMPH NODE EXCISION;  Surgeon: Teresa Lonni HERO, MD;  Location: [facility];  Service: General;  Laterality: Right;    FAMILY HISTORY: Family History  Problem Relation Age of Onset   Cancer Neg Hx     ADVANCED DIRECTIVES (Y/N):  @ADVDIR @  HEALTH MAINTENANCE: Social History   Tobacco Use   Smoking status: Former    Current packs/day: 1.00    Average packs/day: 1 pack/day for 15.0 years (15.0 ttl pk-yrs)    Types: Cigarettes   Smokeless tobacco: Never  Substance Use Topics   Alcohol  use: Not Currently    Comment: h/o heavy use   Drug use: Not Currently    Types: Marijuana, Cocaine    Comment: remote     Colonoscopy:  PAP:  Bone density:  Lipid panel:  Allergies  Allergen Reactions   Sildenafil Other (See Comments)    Unknown reaction - reported by [facility]    Current Facility-Administered Medications  Medication Dose Route Frequency Provider Last Rate Last Admin   ceFEPIme  (MAXIPIME ) 2 g in sodium chloride  0.9 % 100 mL IVPB  2 g Intravenous Q24H Hunt, Madison H, RPH 200 mL/hr at 11-03-2023 1229 2 g at November 03, 2023 1229   dextrose  10 % infusion   Intravenous Continuous Bradler, Evan K, MD 100 mL/hr at 2023/11/03 0915 New Bag at 11-03-2023 0915   dextrose  50 % solution            docusate (COLACE) 50 MG/5ML liquid 100 mg  100 mg Per Tube BID Keene, Jeremiah D, NP  docusate sodium  (COLACE) capsule 100 mg  100 mg Oral BID PRN Keene, Jeremiah D, NP       fentaNYL  (SUBLIMAZE ) injection 25 mcg  25 mcg Intravenous Q15 min PRN Keene, Jeremiah D, NP       fentaNYL  (SUBLIMAZE ) injection 25-100 mcg  25-100 mcg Intravenous Q30 min PRN Keene, Jeremiah D, NP   100 mcg at 11/30/23 1252   hydrocortisone  sodium succinate  (SOLU-CORTEF ) 100 MG injection 50 mg  50 mg Intravenous Q6H Keene, Jeremiah D, NP   50 mg at 2023/11/30 1129   lactated ringers  bolus 500 mL  500 mL Intravenous Once Bradler, Evan K, MD        lactated ringers  infusion   Intravenous Continuous Bradler, Evan K, MD       micafungin  (MYCAMINE ) 100 mg in sodium chloride  0.9 % 100 mL IVPB  100 mg Intravenous Q24H Aleskerov, Fuad, MD       midazolam  (VERSED ) injection 1-2 mg  1-2 mg Intravenous Q1H PRN Keene, Jeremiah D, NP       norepinephrine  (LEVOPHED ) 16 mg in 250mL (0.064 mg/mL) premix infusion  0-40 mcg/min Intravenous Continuous Keene, Jeremiah D, NP       pantoprazole  (PROTONIX ) injection 40 mg  40 mg Intravenous QHS Keene, Jeremiah D, NP       polyethylene glycol (MIRALAX  / GLYCOLAX ) packet 17 g  17 g Oral Daily PRN Keene, Jeremiah D, NP       polyethylene glycol (MIRALAX  / GLYCOLAX ) packet 17 g  17 g Per Tube Daily Keene, Jeremiah D, NP       propofol  (DIPRIVAN ) 1000 MG/100ML infusion            rocuronium  (ZEMURON ) 100 MG/10ML injection            sodium bicarbonate  150 mEq in dextrose  5 % 1,150 mL infusion   Intravenous Continuous Shellia Inge BIRCH, NP 125 mL/hr at 11/30/23 1107 New Bag at 11-30-23 1107   sodium zirconium cyclosilicate  (LOKELMA ) packet 10 g  10 g Per Tube Once Bradler, Evan K, MD       vancomycin  (VANCOREADY) IVPB 1500 mg/300 mL  1,500 mg Intravenous Once Hunt, Madison H, RPH       And   vancomycin  (VANCOCIN ) IVPB 1000 mg/200 mL premix  1,000 mg Intravenous Once Hunt, Madison H, RPH       vasopressin  (PITRESSIN) 20 Units in 100 mL (0.2 unit/mL) infusion-*FOR SHOCK*  0-0.04 Units/min Intravenous Continuous Bradler, Evan K, MD 12 mL/hr at 2023/11/30 0820 0.04 Units/min at 2023/11/30 0820    OBJECTIVE: Vitals:   11-30-23 1215 11-30-23 1230  BP: 93/81 102/83  Pulse:    Resp: (!) 21 (!) 23  Temp:    SpO2:       There is no height or weight on file to calculate BMI.    ECOG FS:4 - Bedbound  General: Ill-appearing.  Intubated and sedated. Eyes: Pink conjunctiva, anicteric sclera. HEENT: ET tube in place. Lungs: Intubated. Heart: Regular rate and rhythm. Abdomen: Soft, nontender, no obvious  distention. Musculoskeletal: No edema, cyanosis, or clubbing. Skin: No rashes or petechiae noted.  LAB RESULTS:  Lab Results  Component Value Date   NA 128 (L) 11-30-23   K 7.0 (HH) 30-Nov-2023   CL 96 (L) 11/30/2023   CO2 10 (L) 11/30/2023   GLUCOSE 167 (H) 2023/11/30   BUN 92 (H) 11-30-23   CREATININE 4.66 (H) November 30, 2023   CALCIUM  8.1 (L) November 30, 2023   PROT 4.7 (L) 11-30-23  ALBUMIN  2.4 (L) 11-09-2023   AST 85 (H) November 09, 2023   ALT 18 2023/11/09   ALKPHOS 50 2023-11-09   BILITOT 1.0 11-09-23   GFRNONAA 12 (L) 11/09/23    Lab Results  Component Value Date   WBC 0.8 (LL) 2023/11/09   NEUTROABS 0.5 (L) Nov 09, 2023   HGB 7.5 (L) Nov 09, 2023   HCT 25.0 (L) 11-09-2023   MCV 96.5 11-09-2023   PLT 55 (L) 11-09-23     STUDIES: DG Chest Port 1 View Result Date: Nov 09, 2023 CLINICAL DATA:  Line placement EXAM: PORTABLE CHEST 1 VIEW, abdominal x-ray limited for tube placement COMPARISON:  X-ray November 09, 2023 FINDINGS: Stable ET tube. Stable right IJ port with tip overlying the right atrium. Enteric tube in place with the tip extending beneath the diaphragm. However tip overlies the upper stomach and side hole above the GE junction. This could be advanced further into the stomach. New double-lumen left IJ catheter with the tip along the upper SVC. Underinflation with basilar atelectasis. No pneumothorax, effusion or edema. Stable cardiopericardial silhouette. Overlapping cardiac leads and defibrillator pads. Several dilated loops of bowel with air in the upper abdomen. Presumed percutaneous nephrostomy tube on the right. Bilateral ureteral stents suggested. IMPRESSION: Interval placement of enteric tube with side hole above the GE junction. This could be advanced further into the stomach. Persistent distended, dilated loops of bowel throughout the visualized portions of the upper abdomen. New left IJ double-lumen catheter.  No pneumothorax. Basilar atelectasis. Stable ET tube and  right-sided chest port. Percutaneous nephrostomy and ureteral stent Electronically Signed   By: Ranell Bring M.D.   On: Nov 09, 2023 12:04   DG Abd 1 View Result Date: 2023-11-09 CLINICAL DATA:  Line placement EXAM: PORTABLE CHEST 1 VIEW, abdominal x-ray limited for tube placement COMPARISON:  X-ray November 09, 2023 FINDINGS: Stable ET tube. Stable right IJ port with tip overlying the right atrium. Enteric tube in place with the tip extending beneath the diaphragm. However tip overlies the upper stomach and side hole above the GE junction. This could be advanced further into the stomach. New double-lumen left IJ catheter with the tip along the upper SVC. Underinflation with basilar atelectasis. No pneumothorax, effusion or edema. Stable cardiopericardial silhouette. Overlapping cardiac leads and defibrillator pads. Several dilated loops of bowel with air in the upper abdomen. Presumed percutaneous nephrostomy tube on the right. Bilateral ureteral stents suggested. IMPRESSION: Interval placement of enteric tube with side hole above the GE junction. This could be advanced further into the stomach. Persistent distended, dilated loops of bowel throughout the visualized portions of the upper abdomen. New left IJ double-lumen catheter.  No pneumothorax. Basilar atelectasis. Stable ET tube and right-sided chest port. Percutaneous nephrostomy and ureteral stent Electronically Signed   By: Ranell Bring M.D.   On: 11/09/23 12:04   DG Chest Portable 1 View Result Date: 2023-11-09 CLINICAL DATA:  Status post intubation. EXAM: PORTABLE CHEST 1 VIEW COMPARISON:  10/06/2023 FINDINGS: Low lung volume. Mild central pulmonary vascular congestion, which is likely accentuated by low lung volume. No frank pulmonary edema. There are nonspecific opacities at the lung bases, which may represent fibrosis versus atelectasis. Bilateral lungs are otherwise clear. No acute consolidation or lung collapse. Bilateral costophrenic angles are clear.  Normal cardio-mediastinal silhouette. No acute osseous abnormalities. The soft tissues are within normal limits. Interval placement of endotracheal tube with its tip approximately 3.9 cm above the carina. Redemonstration of right-sided CT Port-A-Cath with its tip overlying the cavoatrial junction region. IMPRESSION: 1. Interval placement of endotracheal tube  with its tip proximally 3.9 cm above the carina. 2. Mild central pulmonary vascular congestion. No frank pulmonary edema. No acute pulmonary abnormality. Electronically Signed   By: Ree Molt M.D.   On: Nov 09, 2023 08:37   IR NEPHROSTOMY PLACEMENT LEFT Result Date: 10/25/2023 INDICATION: 78 year old gentleman with history of bilateral ureteral obstruction due to retroperitoneal lymphadenopathy from lymphoma has worsening renal failure despite presence of bilateral ureteral stents. IR consulted for nephrostomy drain placement. EXAM: 1. Successful placement of right percutaneous nephrostomy drain 2. Unsuccessful placement of left pregnancy nephrostomy drain COMPARISON:  CT abdomen pelvis 10/18/2023 MEDICATIONS: Rocephin  1 gm IV; The antibiotic was administered in an appropriate time frame prior to skin puncture. ANESTHESIA/SEDATION: Fentanyl  100 mcg IV; Versed  2 mg IV Moderate Sedation Time:  35 The patient was continuously monitored during the procedure by the interventional radiology nurse under my direct supervision. CONTRAST:  10 mL of Omnipaque  300-administered into the collecting system(s) FLUOROSCOPY TIME:  Radiation Exposure Index (as provided by the fluoroscopic device): 29 mGy Kerma COMPLICATIONS: None immediate. PROCEDURE: Informed written consent was obtained from the patient after a thorough discussion of the procedural risks, benefits and alternatives. All questions were addressed. Maximal Sterile Barrier Technique was utilized including caps, mask, sterile gowns, sterile gloves, sterile drape, hand hygiene and skin antiseptic. A timeout was  performed prior to the initiation of the procedure. Patient position prone on the procedure table. Bilateral flank skin prepped and draped in usual fashion. Sonographic evaluation of the right kidney demonstrated mild hydronephrosis. Following local administration, an upper pole calyx was accessed with a 21 gauge needle utilizing continuous ultrasound guidance. 21 gauge needle exchanged for transitional dilator set over 0.018 inch guidewire. Contrast administered through the transitional dilator confirmed appropriate access of the renal collecting system. The transitional dilator was exchanged for 10 French multipurpose pigtail drain over 0.035 inch guidewire. 5 mL of purulent urine was aspirated and sent for Gram stain and culture. The drain was secured to skin with suture and connected to bag. Sonographic evaluation of the left kidney demonstrated minimal hydronephrosis. Following local administration, multiple attempts were made to access mid and lower pole calices with a 21 gauge needle utilizing continuous ultrasound guidance, however none were successful. Given only minimal hydronephrosis of the left kidney, the procedure was terminated. IMPRESSION: 1. Successful insertion of 10 French right nephrostomy drain. 2. Unsuccessful attempt at insertion of left nephrostomy drain due to minimal hydronephrosis. If the patient's renal function does not significantly improve, repeat ultrasound should be performed to evaluate the degree of right hydronephrosis. Electronically Signed   By: Aliene Lloyd M.D.   On: 10/25/2023 13:31   IR NEPHROSTOMY PLACEMENT RIGHT Result Date: 10/20/2023 INDICATION: 78 year old gentleman with history of bilateral ureteral obstruction due to retroperitoneal lymphadenopathy from lymphoma has worsening renal failure despite presence of bilateral ureteral stents. IR consulted for nephrostomy drain placement. EXAM: 1. Successful placement of right percutaneous nephrostomy drain 2.  Unsuccessful placement of left pregnancy nephrostomy drain COMPARISON:  CT abdomen pelvis 10/18/2023 MEDICATIONS: Rocephin  1 gm IV; The antibiotic was administered in an appropriate time frame prior to skin puncture. ANESTHESIA/SEDATION: Fentanyl  100 mcg IV; Versed  2 mg IV Moderate Sedation Time:  35 The patient was continuously monitored during the procedure by the interventional radiology nurse under my direct supervision. CONTRAST:  10 mL of Omnipaque  300-administered into the collecting system(s) FLUOROSCOPY TIME:  Radiation Exposure Index (as provided by the fluoroscopic device): 29 mGy Kerma COMPLICATIONS: None immediate. PROCEDURE: Informed written consent was obtained from the  patient after a thorough discussion of the procedural risks, benefits and alternatives. All questions were addressed. Maximal Sterile Barrier Technique was utilized including caps, mask, sterile gowns, sterile gloves, sterile drape, hand hygiene and skin antiseptic. A timeout was performed prior to the initiation of the procedure. Patient position prone on the procedure table. Bilateral flank skin prepped and draped in usual fashion. Sonographic evaluation of the right kidney demonstrated mild hydronephrosis. Following local administration, an upper pole calyx was accessed with a 21 gauge needle utilizing continuous ultrasound guidance. 21 gauge needle exchanged for transitional dilator set over 0.018 inch guidewire. Contrast administered through the transitional dilator confirmed appropriate access of the renal collecting system. The transitional dilator was exchanged for 10 French multipurpose pigtail drain over 0.035 inch guidewire. 5 mL of purulent urine was aspirated and sent for Gram stain and culture. The drain was secured to skin with suture and connected to bag. Sonographic evaluation of the left kidney demonstrated minimal hydronephrosis. Following local administration, multiple attempts were made to access mid and lower pole  calices with a 21 gauge needle utilizing continuous ultrasound guidance, however none were successful. Given only minimal hydronephrosis of the left kidney, the procedure was terminated. IMPRESSION: 1. Successful insertion of 10 French right nephrostomy drain. 2. Unsuccessful attempt at insertion of left nephrostomy drain due to minimal hydronephrosis. If the patient's renal function does not significantly improve, repeat ultrasound should be performed to evaluate the degree of right hydronephrosis. Electronically Signed   By: Aliene Lloyd M.D.   On: 10/20/2023 14:39   CT ABDOMEN PELVIS WO CONTRAST Result Date: 10/18/2023 CLINICAL DATA:  Kidney failure stenting EXAM: CT ABDOMEN AND PELVIS WITHOUT CONTRAST TECHNIQUE: Multidetector CT imaging of the abdomen and pelvis was performed following the standard protocol without IV contrast. RADIATION DOSE REDUCTION: This exam was performed according to the departmental dose-optimization program which includes automated exposure control, adjustment of the mA and/or kV according to patient size and/or use of iterative reconstruction technique. COMPARISON:  CT 10/09/2023, 09/28/2023 FINDINGS: Lower chest: Lung bases demonstrate minimal atelectasis or scarring. No acute airspace disease. Differential blood cardiac pool suggesting anemia Hepatobiliary: Stable hepatic cysts. No calcified gallstone or biliary dilatation. Pancreas: Unremarkable. No pancreatic ductal dilatation or surrounding inflammatory changes. Spleen: Normal in size without focal abnormality. Adrenals/Urinary Tract: Adrenal glands are within normal limits. Bilateral ureteral stent with proximal pigtails in the renal collecting system and distal pigtails in the posterior bladder. Small amount of air within the renal collecting systems and adjacent to the proximal left and mid bilateral ureteral stents. Small amount of air within the urinary bladder. Mild hydronephrosis probably similar compared to the most  recent prior CT. Stomach/Bowel: Stomach nonenlarged. No dilated small bowel. No acute bowel wall thickening. Negative appendix. Vascular/Lymphatic: Aortic atherosclerosis. Extensive bulky retroperitoneal, pelvic, iliac and inguinal adenopathy corresponding to history of lymphoma. Reproductive: Clips at the prostate. Extensive scrotal edema and fluid. Other: Negative for free air. Small pelvic effusion. Extensive subcutaneous edema. Musculoskeletal: No acute osseous abnormality. Decreased hyperdense psoas muscle enlargement compared to most recent prior CT. IMPRESSION: 1. Bilateral ureteral stents in place. Similar degree of mild bilateral hydronephrosis. New small amount of air within the renal collecting systems and urinary bladder, correlate for recent instrumentation. If none recently, consider gas-forming infection with reflux of air into the renal collecting systems and ureters. 2. Extensive bulky retroperitoneal, pelvic, iliac and inguinal adenopathy corresponding to history of lymphoma. 3. Extensive subcutaneous edema and scrotal edema/fluid. Small pelvic effusion. 4. Decreased hyperdense psoas muscle  enlargement compared to prior likely resolving intramuscular hematomas. 5. Aortic atherosclerosis. Aortic Atherosclerosis (ICD10-I70.0). Electronically Signed   By: Luke Bun M.D.   On: 10/18/2023 19:06   CT ABDOMEN PELVIS WO CONTRAST Result Date: 10/09/2023 CLINICAL DATA:  Retroperitoneal bleed suspected.  Lymphoma. EXAM: CT ABDOMEN AND PELVIS WITHOUT CONTRAST TECHNIQUE: Multidetector CT imaging of the abdomen and pelvis was performed following the standard protocol without IV contrast. RADIATION DOSE REDUCTION: This exam was performed according to the departmental dose-optimization program which includes automated exposure control, adjustment of the mA and/or kV according to patient size and/or use of iterative reconstruction technique. COMPARISON:  CT abdomen pelvis dated 09/28/2023. FINDINGS:  Evaluation of this exam is limited in the absence of intravenous contrast. Lower chest: Partially visualized small bilateral pleural effusions with partial compressive atelectasis of the lower lobes. Pneumonia is not excluded. The tip of a central venous line noted at the cavoatrial junction. There is hypoattenuation of the cardiac blood pool suggestive of anemia. Clinical correlation is recommended. No intra-abdominal free air.  Small ascites. Hepatobiliary: Several small liver cysts. No biliary dilatation. No calcified gallstone. Pancreas: Unremarkable. No pancreatic ductal dilatation or surrounding inflammatory changes. Spleen: Normal in size without focal abnormality. Adrenals/Urinary Tract: Bilateral pigtail ureteral stents noted with proximal tips in the renal pelvis and distal end in the urinary bladder. There is mild bilateral hydronephrosis similar to prior CT. No stone noted. The urinary bladder is decompressed around a Foley catheter. Stomach/Bowel: Small hiatal hernia. Mildly thickened small bowel loops, likely related to ascites and anasarca. Enteritis is less likely. There is no bowel obstruction. The appendix is normal. Vascular/Lymphatic: Mild aortoiliac atherosclerotic disease. The IVC is unremarkable. No portal venous gas. Bulky retroperitoneal and bilateral pelvic sidewall adenopathy. Right inguinal adenopathy as seen previously. Reproductive: Several biopsy clips in the prostate gland. Other: There is diffuse subcutaneous edema and anasarca, significantly progressed since the prior CT. Enlargement of the psoas muscles with ill-defined higher attenuation suspicious for intramuscular hemorrhage. Musculoskeletal: Osteopenia with degenerative changes of the spine. No acute osseous pathology. IMPRESSION: 1. Enlargement of the psoas muscles suspicious for intramuscular hemorrhage. 2. Bulky retroperitoneal and bilateral pelvic sidewall adenopathy. 3. Bilateral pigtail ureteral stents with mild bilateral  hydronephrosis similar to prior CT. 4. Small ascites. Diffuse subcutaneous edema and anasarca, significantly progressed since the prior CT. 5. Partially visualized small bilateral pleural effusions with partial compressive atelectasis of the lower lobes. Pneumonia is not excluded. 6.  Aortic Atherosclerosis (ICD10-I70.0). Electronically Signed   By: Vanetta Chou M.D.   On: 10/09/2023 20:26   VAS US  LOWER EXTREMITY VENOUS (DVT) Result Date: 10/07/2023  Lower Venous DVT Study Patient Name:  RAEKWAN SPELMAN  Date of Exam:   10/06/2023 Medical Rec #: [id number]     Accession #:    [id number] Date of Birth: 1946/01/01     Patient Gender: M Patient Age:   16 years Exam Location:  [facility] Procedure:      VAS US  LOWER EXTREMITY VENOUS (DVT) Referring Phys: COLEN GRIMES --------------------------------------------------------------------------------  Indications: Swelling, and pulmonary embolism.  Risk Factors: Cancer. Limitations: Body habitus and poor ultrasound/tissue interface. Comparison Study: No prior studies. Performing Technologist: Cordella Collet RVT  Examination Guidelines: A complete evaluation includes B-mode imaging, spectral Doppler, color Doppler, and power Doppler as needed of all accessible portions of each vessel. Bilateral testing is considered an integral part of a complete examination. Limited examinations for reoccurring indications may be performed as noted. The reflux portion of the exam is performed with  the patient in reverse Trendelenburg.  +---------+---------------+---------+-----------+----------+-------------------+ RIGHT    CompressibilityPhasicitySpontaneityPropertiesThrombus Aging      +---------+---------------+---------+-----------+----------+-------------------+ CFV      Partial        Yes      Yes                  Acute               +---------+---------------+---------+-----------+----------+-------------------+ FV Prox  None           No        No                   Acute               +---------+---------------+---------+-----------+----------+-------------------+ FV Mid   None           No       No                   Acute               +---------+---------------+---------+-----------+----------+-------------------+ FV DistalNone           No       No                   Acute               +---------+---------------+---------+-----------+----------+-------------------+ PFV                                                   Not well visualized +---------+---------------+---------+-----------+----------+-------------------+ POP      Full           Yes      Yes                                      +---------+---------------+---------+-----------+----------+-------------------+ PTV      Full                                                             +---------+---------------+---------+-----------+----------+-------------------+ PERO     Full                                                             +---------+---------------+---------+-----------+----------+-------------------+ EIV                     Yes      Yes                                      +---------+---------------+---------+-----------+----------+-------------------+ The EIV appears patent.  +---------+---------------+---------+-----------+----------+--------------+ LEFT     CompressibilityPhasicitySpontaneityPropertiesThrombus Aging +---------+---------------+---------+-----------+----------+--------------+ CFV      Full           Yes      Yes                                 +---------+---------------+---------+-----------+----------+--------------+  SFJ      Full                                                        +---------+---------------+---------+-----------+----------+--------------+ FV Prox  Full                                                         +---------+---------------+---------+-----------+----------+--------------+ FV Mid   Full                                                        +---------+---------------+---------+-----------+----------+--------------+ FV DistalFull                                                        +---------+---------------+---------+-----------+----------+--------------+ PFV      Full                                                        +---------+---------------+---------+-----------+----------+--------------+ POP      None           No       No                   Acute          +---------+---------------+---------+-----------+----------+--------------+ PTV      Full                                                        +---------+---------------+---------+-----------+----------+--------------+ PERO     Full                                                        +---------+---------------+---------+-----------+----------+--------------+ Gastroc  Full                                                        +---------+---------------+---------+-----------+----------+--------------+     Summary: RIGHT: - Findings consistent with acute deep vein thrombosis involving the right common femoral vein, and right femoral vein.  - No cystic structure found in the popliteal fossa.  LEFT: - Findings consistent with acute deep vein thrombosis involving the left popliteal vein.  - No cystic structure found in  the popliteal fossa.  *See table(s) above for measurements and observations. Electronically signed by Norman Serve on 10/07/2023 at 8:57:49 AM.    Final    NM Pulmonary Perfusion Result Date: 10/06/2023 CLINICAL DATA:  Aggressive shortness of breath. Left groin swelling. Severe acute kidney injury. Mantle cell lymphoma. EXAM: NUCLEAR MEDICINE PERFUSION LUNG SCAN TECHNIQUE: Perfusion images were obtained in multiple projections after intravenous injection of radiopharmaceutical.  Ventilation scans intentionally deferred if perfusion scan and chest x-ray adequate for interpretation during COVID 19 epidemic. RADIOPHARMACEUTICALS:  3.95 mCi Tc-29m MAA IV COMPARISON:  Portable chest obtained earlier today. FINDINGS: Moderate-sized subsegmental perfusion defects in the anteromedial left upper lobe, lateral right lower lobe and anteromedial right middle lobe. No corresponding abnormality at these locations on the chest radiograph. IMPRESSION: Three moderate-sized subsegmental perfusion defects with no corresponding radiographic abnormality, compatible with pulmonary embolism. Electronically Signed   By: Elspeth Bathe M.D.   On: 10/06/2023 14:47   US  RENAL Result Date: 10/06/2023 CLINICAL DATA:  Hydronephrosis.  Lymphoma. EXAM: RENAL / URINARY TRACT ULTRASOUND COMPLETE COMPARISON:  CT 09/28/2023 FINDINGS: Right Kidney: Renal measurements: 10.1 x 4.8 x 6.0 cm = volume: 150 mL. Normal echogenicity. Mild hydronephrosis. Left Kidney: Renal measurements: 12.0 x 5.4 x 5.7 cm = volume: 182 mL. Normal echogenicity. Mild hydronephrosis. Bladder: Foley catheter within. Other: None. IMPRESSION: Similar mild bilateral hydronephrosis. Electronically Signed   By: Rockey Kilts M.D.   On: 10/06/2023 09:31   DG CHEST PORT 1 VIEW Result Date: 10/06/2023 CLINICAL DATA:  Pneumonia and shortness of breath EXAM: PORTABLE CHEST 1 VIEW COMPARISON:  09/28/2023 FINDINGS: Right Port-A-Cath tip at superior caval/atrial junction. Midline trachea. Patient rotated minimally right. Borderline cardiomegaly. Tortuous thoracic aorta. No pleural effusion or pneumothorax. Mild/subtle density projecting along the left heart border and lateral left hemidiaphragm. Right lung clear. IMPRESSION: Subtle density projecting along the left heart border and left hemidiaphragm. Favored to represent interval atelectasis. Given the clinical history, early or resolving pneumonia could look similar. Depending on clinical concern, consider  radiographic follow-up at 5-7 days. Electronically Signed   By: Rockey Kilts M.D.   On: 10/06/2023 09:25   DG C-Arm 1-60 Min-No Report Result Date: 10/05/2023 Fluoroscopy was utilized by the requesting physician.  No radiographic interpretation.   DG OR UROLOGY CYSTO IMAGE ([facility] ONLY) Result Date: 10/05/2023 There is no interpretation for this exam.  This order is for images obtained during a surgical procedure.  Please See Surgeries Tab for more information regarding the procedure.    ASSESSMENT: Relapsed, refractory mantle cell lymphoma  PLAN:    Relapsed, refractory mantle cell lymphoma: Her most recent encounter with patient's primary oncologist, Dr. Onesimo, no further treatment options are available at this time.  Hospice was previously discussed, but patient and family ready to make this transition wanting to see if he will improve enough for treatment to be reconsidered. Neutropenia: Patient's total white blood cell count 0.8 with an ANC of 0.5.  Likely secondary to progressive malignancy as well as acute illness.  Monitor. Anemia: Patient's hemoglobin is trended down to 7.5.  Also likely multifactorial.  Transfuse to maintain hemoglobin greater than 7.0.  All blood products should be irradiated. Thrombocytopenia: Patient's platelet count is decreased, but appears to be approximately his baseline.  Monitor. Possible tumor lysis syndrome: Given patient's significant hyperkalemia, hyperphosphatemia, hyperuricemia, and acute renal failure tumor lysis syndrome is a significant possibility given the extent of his lymphoma.  Patient's also typically present with hypocalcemia, but patient's corrected calcium  levels are  essentially within normal limits.  Agree with aggressive hydration to maintain urine output and correction of electrolyte abnormalities.  Patient does not have a known history of G6PD deficiency, therefore have ordered rasburicase .  Recommend pharmacy consult for assistance with  dosing. Disposition: Recommend palliative care consult for discussions of goals of care.  Appreciate consult, will follow.  Evalene JINNY Reusing, MD   November 19, 2023 1:23 PM

## 2023-12-01 NOTE — ED Triage Notes (Signed)
 BIBEMS, coming [facility]. C/o unresponsive, sating @80 % on RA, placed on 3L [place] @ [facility] @2am . Responsive intiially. BGL: 30, glucagon IM given by EMS. 130 HR, 101.1 axillary, 120/60. 96% on 3L [place]

## 2023-12-01 NOTE — Progress Notes (Signed)
 PHARMACY CONSULT NOTE - FOLLOW UP  Pharmacy Consult for Electrolyte Monitoring and Replacement   Recent Labs: Potassium (mmol/L)  Date Value  11-14-2023 7.0 (HH)   Magnesium  (mg/dL)  Date Value  87/76/7975 2.6 (H)   Calcium  (mg/dL)  Date Value  98/97/7974 8.1 (L)   Albumin  (g/dL)  Date Value  98/97/7974 2.4 (L)   Phosphorus (mg/dL)  Date Value  87/76/7975 4.4   Sodium (mmol/L)  Date Value  Nov 14, 2023 128 (L)    Assessment: Jerry Tran is a 78 y.o. male admitted on 2023-11-14 with sepsis. Patient was brought in by EMS from [facility] after being found unresponsive. Pharmacy has been consulted to monitor and replace electrolytes as needed.  Goal of Therapy:  Electrolytes within normal limit  Plan:  K elevated at 7 Provider has already ordered: Calcium  gluconate 1 g x 1 Insulin  aspart 10 units + Dextrose  Lokelma  10 g x 1 Follow-up electrolytes this afternoon @ 1300  Jerry Tran ,PharmD Clinical Pharmacist 11-14-2023 10:34 AM

## 2023-12-01 NOTE — ED Notes (Addendum)
 Started bagging patient at this time

## 2023-12-01 NOTE — Consult Note (Signed)
 PHARMACY - ANTICOAGULATION CONSULT NOTE  Pharmacy Consult for IV Heparin  Indication: pulmonary embolus and DVT  Patient Measurements:   Heparin  Dosing Weight: 108.2 kg  Labs: Recent Labs    11-15-23 0806 11-15-23 1052 2023-11-15 1404  HGB 7.5*  --   --   HCT 25.0*  --   --   PLT 55*  --   --   APTT 36  --   --   LABPROT  --  20.4*  --   INR  --  1.7*  --   CREATININE 4.66*  --  4.44*  TROPONINIHS  --  711* 913*    Estimated Creatinine Clearance: 18.8 mL/min (A) (by C-G formula based on SCr of 4.44 mg/dL (H)).   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 Patient with known PE prior to presentation not on anticoagulation  Assessment: 78 y/o M with medical history as above and most recently admitted 11/29 - 12/23 for multiple problems and including bilateral lower extremity DVT / possible PE discharged without anticoagulation due to development of hematuria while on heparin  during that hospitalization. Patient is now here due to unresponsiveness and shock. Pharmacy consulted to initiate heparin  infusion for VTE.  Baseline aPTT 36s, INR 1.7. Baseline CBC notable for pancytopenia with Hgb 7.5 and platelets 55.   Patient was previously therapeutic on heparin  at 1400 units/hr during last admission.  Goal of Therapy:  Heparin  level 0.3 - 0.5 units/ml (no bolus protocol) Monitor platelets by anticoagulation protocol: Yes   Plan:  --Will start heparin  at 1400 units/hr, no bolus --Heparin  level 8 hours after initiation --Daily CBC per protocol while on IV heparin ; patient is high bleed risk  Marolyn KATHEE Mare 11/15/23,3:55 PM

## 2023-12-01 NOTE — Consult Note (Signed)
 Cardiology Consultation   Patient ID: Jerry Tran MRN: [id number]; DOB: 1946-10-21  Admit date: December 01, 2023 Date of Consult: 2023-12-01  PCP:  [facility]   [facility] Providers Cardiologist: New  Patient Profile:   Jerry Tran is a 78 y.o. male with a hx of osteoarthritis, CKD stage 3, HLD, GERD, HTN, paroxysmal Afib, prostate cancer s/p radiation therapy, h/o right nephrostomy placement, PTSD, relapsed refractory mantle cell lymphoma with multiple chemotherapy regimens used in the past, recent PE and bilateral DVT late December 2024 (not on [facility] due to acute blood loss anemia with [facility]) who is being seen 01-Dec-2023 for the evaluation of elevated troponin at the request of Dr. Aleskerov.  History of Present Illness:   Jerry Tran with no significant cardiac history. Echo in 2023 showed LVEF 60-65%, G1DD, normal RVSF, small PFO with L>R flow  The patient was admitted to [facility] 2023-12-01 for AMS found to have severe metabolic derangements, hypoglycemia, hypoxia and acute respiratory failure form presumed PE. He required intubation in the ER, now on mechanical vent in the ICU, and required initiation of CRRT.  Vitals: BP 136/121, pulse 120bpm, RR 30. Labs showed sodium 128, K 7, CO2 10, AST 85,, Scr 4.66, BUN 91, Hgb 7.5, WBC 0.8, plt 55. Ddimer 17. HS trop [id number]. pH<6.95, pCO2 49, pO2 401, bicarb 10. BG<10. EKG showed ST. CXR showed mild central pulmonary vascular congestion.   He was given IVF and sodium bicarb and started on IV vasopressin , IV Levophed , and IV abx.  Tele shows A tach vs Aflutter with rates 140-150s. He is significantly volume up on exam.   Past Medical History:  Diagnosis Date   Arthritis    Chronic kidney disease, stage 3b (HCC) 12/09/2021   Dyslipidemia    GERD (gastroesophageal reflux disease)    Hypertension    PAF (paroxysmal atrial fibrillation) (HCC)    Prostate cancer (HCC) 03/2021   s/p radiation therapy   PTSD (post-traumatic stress  disorder)     Past Surgical History:  Procedure Laterality Date   CYSTOSCOPY W/ RETROGRADES Bilateral 10/05/2023   Procedure: CYSTOSCOPY WITH BILATERAL RETROGRADE PYELOGRAM and BILATERAL STENT PLACEMENT;  Surgeon: Carolee Sherwood JONETTA DOUGLAS, MD;  Location: [facility];  Service: Urology;  Laterality: Bilateral;   IR IMAGING GUIDED PORT INSERTION  11/06/2022   IR NEPHROSTOMY PLACEMENT LEFT  10/20/2023   IR NEPHROSTOMY PLACEMENT RIGHT  10/20/2023   LIPOMA EXCISION Right 12/09/2021   Procedure: RIGHT SUPERCLAVICULAR LYMPH NODE EXCISION;  Surgeon: Teresa Lonni HERO, MD;  Location: [facility] OR;  Service: General;  Laterality: Right;     Home Medications:  Prior to Admission medications   Medication Sig Start Date End Date Taking? Authorizing Provider  acyclovir  (ZOVIRAX ) 400 MG tablet TAKE 1 TABLET(400 MG) BY MOUTH DAILY 09/04/23  Yes Onesimo Emaline Brink, MD  allopurinol  (ZYLOPRIM ) 100 MG tablet Take 1 tablet (100 mg total) by mouth daily. 10/02/23  Yes Onesimo Emaline Brink, MD  aluminum-magnesium  hydroxide-simethicone  (MAALOX) 200-200-20 MG/5ML SUSP Take 10 mLs by mouth every 6 (six) hours as needed.   Yes [provider]  ammonium lactate (LAC-HYDRIN) 12 % lotion Apply 1 application topically 2 (two) times daily as needed for dry skin.   Yes [provider]  atorvastatin  (LIPITOR) 10 MG tablet Take 10 mg by mouth at bedtime.   Yes [provider]  Baclofen 5 MG TABS Take 5 mg by mouth daily. 10/23/23  Yes [provider]  cyclobenzaprine  (FLEXERIL ) 5 MG tablet Take 1  tablet (5 mg total) by mouth 3 (three) times daily as needed for muscle spasms. Patient taking differently: Take 5 mg by mouth every 8 (eight) hours as needed for muscle spasms. 10/22/23  Yes Jillian Buttery, MD  diclofenac Sodium (VOLTAREN) 1 % GEL Apply 2 g topically 4 (four) times daily as needed (pain).   Yes [provider]  docusate sodium  (COLACE) 100 MG capsule Take 1 capsule (100 mg total) by mouth  2 (two) times daily. 10/22/23  Yes Adhikari, Amrit, MD  fluticasone (FLONASE) 50 MCG/ACT nasal spray Place 1 spray into both nostrils daily as needed for allergies or rhinitis.   Yes [provider]  HYDROcodone -acetaminophen  (NORCO/VICODIN) 5-325 MG tablet Take 2 tablets by mouth every 6 (six) hours as needed for moderate pain (pain score 4-6) or severe pain (pain score 7-10). 10/26/23  Yes [provider]  hydroxypropyl methylcellulose / hypromellose (ISOPTO TEARS / GONIOVISC) 2.5 % ophthalmic solution Place 2 drops into both eyes 2 (two) times daily as needed for dry eyes.   Yes [provider]  lidocaine -prilocaine  (EMLA ) cream Apply to affected area once 10/02/23  Yes Onesimo Emaline Brink, MD  magic mouthwash (multi-ingredient) oral suspension Take 5 mLs by mouth 4 times daily as needed for mouth and throat pain. 04/26/23  Yes Kale, Gautam Kishore, MD  melatonin 3 MG TABS tablet Take 3 mg by mouth at bedtime. 10/29/23  Yes [provider]  midodrine  (PROAMATINE ) 10 MG tablet Take 1 tablet (10 mg total) by mouth 3 (three) times daily with meals. 10/22/23  Yes Jillian Buttery, MD  nystatin  (MYCOSTATIN ) 100000 UNIT/ML suspension Take 5 mLs by mouth 4 (four) times daily as needed.   Yes [provider]  omeprazole (PRILOSEC) 20 MG capsule Take 20 mg by mouth daily before breakfast.   Yes [provider]  ondansetron  (ZOFRAN ) 8 MG tablet Take 1 tablet (8 mg total) by mouth every 8 (eight) hours as needed for nausea or vomiting. Start on the third day after chemotherapy. 10/02/23  Yes Onesimo Emaline Brink, MD  polyethylene glycol (MIRALAX  / GLYCOLAX ) 17 g packet Take 17 g by mouth daily as needed for mild constipation. 10/22/23  Yes Jillian Buttery, MD  prazosin  (MINIPRESS ) 2 MG capsule Take 6 mg by mouth at bedtime. 10/25/23  Yes [provider]  predniSONE  (DELTASONE ) 20 MG tablet Take 2 tablets (40 mg total) by mouth daily with breakfast.  10/22/23  Yes Adhikari, Buttery, MD  prochlorperazine  (COMPAZINE ) 10 MG tablet Take 1 tablet (10 mg total) by mouth every 6 (six) hours as needed for nausea or vomiting. 10/02/23  Yes Onesimo Emaline Brink, MD  senna (SENOKOT) 8.6 MG TABS tablet Take 1 tablet (8.6 mg total) by mouth 2 (two) times daily. 10/22/23  Yes Jillian Buttery, MD  sodium bicarbonate  650 MG tablet Take 1 tablet (650 mg total) by mouth 2 (two) times daily. 10/22/23  Yes Adhikari, Amrit, MD  terbinafine (LAMISIL) 1 % cream Apply 1 application topically 2 (two) times daily as needed (athlete's foot).   Yes [provider]  triamcinolone cream (KENALOG) 0.1 % Apply 1 application topically 2 (two) times daily as needed (rash).   Yes [provider]  Vitamin D, Cholecalciferol, 25 MCG (1000 UT) TABS Take 1 tablet by mouth daily. 10/25/23  Yes [provider]  feeding supplement (ENSURE ENLIVE / ENSURE PLUS) LIQD Take 237 mLs by mouth 2 (two) times daily between meals. Patient not taking: Reported on 09/28/2023 12/11/21   Regalado,  Belkys A, MD  oxybutynin  (DITROPAN -XL) 10 MG 24 hr tablet Take 1 tablet (10 mg total) by mouth at bedtime. 10/22/23   Adhikari, Amrit, MD    Inpatient Medications: Scheduled Meds:  Chlorhexidine  Gluconate Cloth  6 each Topical Daily   dextrose        docusate  100 mg Per Tube BID   hydrocortisone  sod succinate (SOLU-CORTEF ) inj  50 mg Intravenous Q6H   pantoprazole  (PROTONIX ) IV  40 mg Intravenous QHS   polyethylene glycol  17 g Per Tube Daily   rocuronium        sodium zirconium cyclosilicate   10 g Per Tube Once   Continuous Infusions:  ceFEPime  (MAXIPIME ) IV Stopped (11/22/2023 1337)   dextrose  50 mL/hr at November 22, 2023 1402   lactated ringers      micafungin  (MYCAMINE ) 100 mg in sodium chloride  0.9 % 100 mL IVPB     norepinephrine  (LEVOPHED ) Adult infusion 60 mcg/min (2023-11-22 1324)   PrismaSol  BGK 2/3.5     PrismaSol  BGK 2/3.5     PrismaSol  BGK 2/3.5     propofol       rasburicase  (ELITEK ) 6 mg in sodium chloride  0.9 % 46 mL IVPB     sodium bicarbonate  150 mEq in dextrose  5 % 1,150 mL infusion 125 mL/hr at 11-22-2023 1107   vancomycin      vasopressin  0.04 Units/min (Nov 22, 2023 1406)   PRN Meds: dextrose , docusate sodium , fentaNYL  (SUBLIMAZE ) injection, fentaNYL  (SUBLIMAZE ) injection, heparin , midazolam , polyethylene glycol, propofol , rocuronium   Allergies:    Allergies  Allergen Reactions   Sildenafil Other (See Comments)    Unknown reaction - reported by [facility]    Social History:   Social History   Socioeconomic History   Marital status: Married    Spouse name: Not on file   Number of children: Not on file   Years of education: Not on file   Highest education level: Not on file  Occupational History   Occupation: retired  Tobacco Use   Smoking status: Former    Current packs/day: 1.00    Average packs/day: 1 pack/day for 15.0 years (15.0 ttl pk-yrs)    Types: Cigarettes   Smokeless tobacco: Never  Substance and Sexual Activity   Alcohol  use: Not Currently    Comment: h/o heavy use   Drug use: Not Currently    Types: Marijuana, Cocaine    Comment: remote   Sexual activity: Not on file  Other Topics Concern   Not on file  Social History Narrative   Not on file   Social Drivers of Health   Financial Resource Strain: Not on file  Food Insecurity: No Food Insecurity (11-22-23)   Hunger Vital Sign    Worried About Running Out of Food in the Last Year: Never true    Ran Out of Food in the Last Year: Never true  Transportation Needs: No Transportation Needs (11/22/23)   PRAPARE - Administrator, Civil Service (Medical): No    Lack of Transportation (Non-Medical): No  Physical Activity: Not on file  Stress: Not on file  Social Connections: Not on file  Intimate Partner Violence: Not At Risk (Nov 22, 2023)   Humiliation, Afraid, Rape, and Kick questionnaire    Fear of Current or Ex-Partner: No    Emotionally Abused: No     Physically Abused: No    Sexually Abused: No    Family History:    Family History  Problem Relation Age of Onset   Cancer Neg Hx      ROS:  Please see the history of present illness.   All other ROS reviewed and negative.     Physical Exam/Data:   Vitals:   November 21, 2023 1330 Nov 21, 2023 1345 11/21/23 1400 11/21/2023 1415  BP: 101/64 105/77 105/80 105/82  Pulse: (!) 149 (!) 151 (!) 150 (!) 150  Resp: (!) 26 (!) 26 (!) 26 (!) 26  Temp:      TempSrc:      SpO2: 100% 100% 100% 100%   No intake or output data in the 24 hours ending 11-21-2023 1601    10/20/2023    5:00 AM 10/19/2023    5:47 AM 10/18/2023    6:28 AM  Last 3 Weights  Weight (lbs) 238 lb 8.6 oz 234 lb 12.6 oz 235 lb 14.3 oz  Weight (kg) 108.2 kg 106.5 kg 107 kg     There is no height or weight on file to calculate BMI.  General:  Well nourished, well developed, in no acute distress HEENT: normal Neck: no JVD Vascular: No carotid bruits; Distal pulses 2+ bilaterally Cardiac:  normal S1, S2; Reg Ireg, tachy; no murmur  Lungs:  clear to auscultation bilaterally, no wheezing, rhonchi or rales  Abd: soft, nontender, no hepatomegaly  Ext: 3+ edema up into the abdomen Musculoskeletal:  No deformities, BUE and BLE strength normal and equal Skin: warm and dry  Neuro:  CNs 2-12 intact, no focal abnormalities noted Psych:  Normal affect   EKG:  The EKG was personally reviewed and demonstrates:  SVT 162bpm, RAD. Possible Sinus tachy Telemetry:  Telemetry was personally reviewed and demonstrates:  Atach vs aflutter HR 140-150s  Relevant CV Studies:  Echo 09/2022  1. Left ventricular ejection fraction, by estimation, is 60 to 65%. Left  ventricular ejection fraction by 3D volume is 63 %. The left ventricle has  normal function. The left ventricle has no regional wall motion  abnormalities. Left ventricular diastolic   parameters are consistent with Grade I diastolic dysfunction (impaired  relaxation). The average left  ventricular global longitudinal strain is  -23.3 %. The global longitudinal strain is normal.   2. Right ventricular systolic function is normal. The right ventricular  size is normal. There is normal pulmonary artery systolic pressure. The  estimated right ventricular systolic pressure is 27.2 mmHg.   3. There is no evidence of cardiac tamponade.   4. The mitral valve is grossly normal. Trivial mitral valve  regurgitation.   5. The aortic valve is tricuspid. Aortic valve regurgitation is not  visualized. No aortic stenosis is present.   6. The inferior vena cava is normal in size with greater than 50%  respiratory variability, suggesting right atrial pressure of 3 mmHg.   7. Small PFO suspected with left to right flow.   Comparison(s): Changes from prior study are noted. 12/10/2021: LVEF 55-60%,  RVSP 45.7 mmHg.   Echo 11/2021 1. Left ventricular ejection fraction, by estimation, is 55 to 60%. The  left ventricle has normal function. The left ventricle has no regional  wall motion abnormalities. Left ventricular diastolic parameters are  consistent with Grade I diastolic  dysfunction (impaired relaxation).   2. Right ventricular systolic function is normal. The right ventricular  size is mildly enlarged. There is moderately elevated pulmonary artery  systolic pressure. The estimated right ventricular systolic pressure is  45.7 mmHg.   3. The mitral valve is grossly normal. Trivial mitral valve  regurgitation.   4. The aortic valve is tricuspid. There is mild calcification of the  aortic  valve. There is mild thickening of the aortic valve. Aortic valve  regurgitation is trivial. Aortic valve sclerosis/calcification is present,  without any evidence of aortic  stenosis.   5. Aortic dilatation noted. There is mild dilatation of the ascending  aorta, measuring 37 mm.   6. The inferior vena cava is dilated in size with <50% respiratory  variability, suggesting right atrial pressure of  15 mmHg.   7. Frequent PVCs during study   Comparison(s): No prior Echocardiogram.   Laboratory Data:  High Sensitivity Troponin:   Recent Labs  Lab 2023-11-16 1052 11/16/2023 1404  TROPONINIHS 711* 913*     Chemistry Recent Labs  Lab 16-Nov-2023 0806 16-Nov-2023 1404  NA 128* 125*  K 7.0* 5.6*  CL 96* 95*  CO2 10* 11*  GLUCOSE 167* 175*  BUN 92* 91*  CREATININE 4.66* 4.44*  CALCIUM  8.1* 7.5*  GFRNONAA 12* 13*  ANIONGAP 22* 19*    Recent Labs  Lab 11-16-23 0806  PROT 4.7*  ALBUMIN  2.4*  AST 85*  ALT 18  ALKPHOS 50  BILITOT 1.0   Lipids No results for input(s): CHOL, TRIG, HDL, LABVLDL, LDLCALC, CHOLHDL in the last 168 hours.  Hematology Recent Labs  Lab November 16, 2023 0806  WBC 0.8*  RBC 2.59*  HGB 7.5*  HCT 25.0*  MCV 96.5  MCH 29.0  MCHC 30.0  RDW 18.4*  PLT 55*   Thyroid No results for input(s): TSH, FREET4 in the last 168 hours.  BNPNo results for input(s): BNP, PROBNP in the last 168 hours.  DDimer  Recent Labs  Lab 11/16/2023 1052  DDIMER 17.00*     Radiology/Studies:  DG Abd 1 View Result Date: 11/16/2023 CLINICAL DATA:  OG tube placement EXAM: ABDOMEN - 1 VIEW limited for tube placement COMPARISON:  11-16-2023 earlier FINDINGS: Limited view of the upper abdomen has enteric tube in place. Tip at the GE junction and side hole above in the mediastinum. Recommend this be advanced further into the stomach. Ureteral stents are seen. Right-sided presumed percutaneous nephrostomy. Overlapping cardiac leads and defibrillator pads. Multiple dilated air-filled loops of bowel throughout the upper abdomen. IMPRESSION: Limited x-ray for tube placement has a enteric tube with the tip at the GE junction. Recommend this be advanced several cm further into the stomach. Electronically Signed   By: Ranell Bring M.D.   On: 2023-11-16 15:29   DG Chest Port 1 View Result Date: 2023-11-16 CLINICAL DATA:  Line placement EXAM: PORTABLE CHEST 1 VIEW, abdominal x-ray  limited for tube placement COMPARISON:  X-ray Nov 16, 2023 FINDINGS: Stable ET tube. Stable right IJ port with tip overlying the right atrium. Enteric tube in place with the tip extending beneath the diaphragm. However tip overlies the upper stomach and side hole above the GE junction. This could be advanced further into the stomach. New double-lumen left IJ catheter with the tip along the upper SVC. Underinflation with basilar atelectasis. No pneumothorax, effusion or edema. Stable cardiopericardial silhouette. Overlapping cardiac leads and defibrillator pads. Several dilated loops of bowel with air in the upper abdomen. Presumed percutaneous nephrostomy tube on the right. Bilateral ureteral stents suggested. IMPRESSION: Interval placement of enteric tube with side hole above the GE junction. This could be advanced further into the stomach. Persistent distended, dilated loops of bowel throughout the visualized portions of the upper abdomen. New left IJ double-lumen catheter.  No pneumothorax. Basilar atelectasis. Stable ET tube and right-sided chest port. Percutaneous nephrostomy and ureteral stent Electronically Signed   By: Ranell Bring HERO.D.  On: November 18, 2023 12:04   DG Abd 1 View Result Date: 11-18-23 CLINICAL DATA:  Line placement EXAM: PORTABLE CHEST 1 VIEW, abdominal x-ray limited for tube placement COMPARISON:  X-ray 11-18-23 FINDINGS: Stable ET tube. Stable right IJ port with tip overlying the right atrium. Enteric tube in place with the tip extending beneath the diaphragm. However tip overlies the upper stomach and side hole above the GE junction. This could be advanced further into the stomach. New double-lumen left IJ catheter with the tip along the upper SVC. Underinflation with basilar atelectasis. No pneumothorax, effusion or edema. Stable cardiopericardial silhouette. Overlapping cardiac leads and defibrillator pads. Several dilated loops of bowel with air in the upper abdomen. Presumed  percutaneous nephrostomy tube on the right. Bilateral ureteral stents suggested. IMPRESSION: Interval placement of enteric tube with side hole above the GE junction. This could be advanced further into the stomach. Persistent distended, dilated loops of bowel throughout the visualized portions of the upper abdomen. New left IJ double-lumen catheter.  No pneumothorax. Basilar atelectasis. Stable ET tube and right-sided chest port. Percutaneous nephrostomy and ureteral stent Electronically Signed   By: Ranell Bring M.D.   On: 2023-11-18 12:04   DG Chest Portable 1 View Result Date: 11-18-2023 CLINICAL DATA:  Status post intubation. EXAM: PORTABLE CHEST 1 VIEW COMPARISON:  10/06/2023 FINDINGS: Low lung volume. Mild central pulmonary vascular congestion, which is likely accentuated by low lung volume. No frank pulmonary edema. There are nonspecific opacities at the lung bases, which may represent fibrosis versus atelectasis. Bilateral lungs are otherwise clear. No acute consolidation or lung collapse. Bilateral costophrenic angles are clear. Normal cardio-mediastinal silhouette. No acute osseous abnormalities. The soft tissues are within normal limits. Interval placement of endotracheal tube with its tip approximately 3.9 cm above the carina. Redemonstration of right-sided CT Port-A-Cath with its tip overlying the cavoatrial junction region. IMPRESSION: 1. Interval placement of endotracheal tube with its tip proximally 3.9 cm above the carina. 2. Mild central pulmonary vascular congestion. No frank pulmonary edema. No acute pulmonary abnormality. Electronically Signed   By: Ree Molt M.D.   On: 11/18/2023 08:37     Assessment and Plan:   Elevated troponin - patient presented with AMS requiring intubation in the ER and on pressor support in the ICU and being treated for shock (suspected septic), severe metabolic acidosis, tachyarrhythmia, Acute hypoxic respiratory failure, AKI, hyperkalemia, pancytopenia  with h/o lymphoma - EKG/tele with ST vs Atach vs aflutter HR sustained 140-150s - HS trop 700>900s -  no h/o CAD - avoid heparin  with Hgb 7.5/thrombocytopenia - continue to trend troponin to peak - check an echo - not a good candidate for invasive procedures  Tachyarrhythmia - Atach vs aflutter HR 140-150s - with low BP on pressor support - start IV amiodarone  - can consider cardioversion if he remains with elevated rates  AKI on CKD stage 3 - Scr 4.66 on admission started on CRT - nephrology following  Anasarca - CRT as above - check echo  Acute respiratory failure - vent per CCM  Sepsis, shock - LA 8.4>8.0 - IV abx per CCM  PE/DVT - DVT study showed extensive b/l lower extremity DVT thrombosis - ddimer 17 - avoid a/c given anemia and thrombocytopenia  For questions or updates, please contact [facility] Please consult [email or web address] for contact info under    Signed, Adar Rase VEAR Fishman, PA-C  November 18, 2023 4:01 PM

## 2023-12-01 NOTE — Progress Notes (Signed)
 Initial Nutrition Assessment  DOCUMENTATION CODES:   Not applicable  INTERVENTION:   Once appropriate for tube feeds, recommend:  Pivot 1.5@60ml /hr- Initiate at 21ml/hr and increase by 10ml/hr q 8 hours until goal rate is reached.   ProSource TF 20- Give 60ml daily via tube, each supplement provides 80kcal and 20g of protein.   Free water  flushes 30ml q4 hours to maintain tube patency   Regimen provides 2240kcal/day, 155g/day protein and 1274ml/day of free water .   Pt at high refeed risk; recommend monitor potassium, magnesium  and phosphorus labs daily until stable  Rena-vit daily via tube   Daily weights   NUTRITION DIAGNOSIS:   Inadequate oral intake related to inability to eat (pt sedated and ventilated) as evidenced by NPO status.  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 pylori and CHF who is admitted with AKI and concern of tumor lysis syndrome.  Pt sedated and ventilated. Pt is known to nutrition department from a recent previous admission. Pt noted to be eating 75-100% of meals during his last admission. OGT in place but needs advancement. Will plan to initiate tube feeds once pt is more stable. Pt is likely at refeed risk. Per chart, pt appears to have gained ~55lbs over the past 5 months; pt is noted to have anasarca. RD is unsure of pt's UBW. Plan is for CRRT today. Pt previously noted to have moderate malnutrition. RD will obtain exam at follow up.   Medications reviewed and include: colace, protonix , miralax , solu-cortef , lokelma , cefepime , 10% dextrose  @50ml /hr,  mycamine , levophed , Na bicarbonate, vancomycin , vasopressin    Labs reviewed: Na 125(L), K 5.6(H), BUN 91(H), creat 4.44(H), P 9.5(H) Uric acid- 11.8(H) Wbc- 0.8(L), Hgb 7.5(L), Hct 25.0(L) Cbgs- 196, 284, 122, 89, 157, <10(L) x 24 hrs   Patient is currently intubated on ventilator support MV: 15.4 L/min Temp (24hrs), Avg:98 F (36.7 C), Min:97.8 F (36.6 C), Max:98.2 F (36.8 C)  Propofol : none   MAP- >35mmHg   NUTRITION - FOCUSED PHYSICAL EXAM: Unable to perform at this time   Diet Order:   Diet Order             Diet NPO time specified  Diet effective now                  EDUCATION NEEDS:   No education needs have been identified at this time  Skin:  Skin Assessment: Reviewed RN Assessment (ecchymosis)  Last BM:  1/2- type 6  Height:   Ht Readings from Last 1 Encounters:  10/05/23 6' 4 (1.93 m)    Weight:   Wt Readings from Last 1 Encounters:  10/20/23 108.2 kg    Ideal Body Weight:  91.8 kg  Estimated Nutritional Needs:   Kcal:  2169kcal/day  Protein:  140-160g/day  Fluid:  2.4-2.7L/day  Augustin Shams MS, RD, LDN If unable to be reached, please send secure chat to RD inpatient available from 8:00a-4:00p daily

## 2023-12-01 NOTE — ED Notes (Signed)
 Xray called @this  time

## 2023-12-01 NOTE — ED Notes (Signed)
 RT bedside.

## 2023-12-01 DEATH — deceased
